# Patient Record
Sex: Female | Born: 1955 | Race: Black or African American | Hispanic: No | Marital: Married | Smoking: Former smoker
Health system: Southern US, Community
[De-identification: ages and names within clinical notes are randomized; demographics above are authoritative.]

## PROBLEM LIST (undated history)

## (undated) DIAGNOSIS — M1990 Unspecified osteoarthritis, unspecified site: Secondary | ICD-10-CM

## (undated) DIAGNOSIS — E7521 Fabry (-Anderson) disease: Secondary | ICD-10-CM

## (undated) DIAGNOSIS — IMO0001 Reserved for inherently not codable concepts without codable children: Secondary | ICD-10-CM

## (undated) DIAGNOSIS — I10 Essential (primary) hypertension: Secondary | ICD-10-CM

## (undated) DIAGNOSIS — E559 Vitamin D deficiency, unspecified: Secondary | ICD-10-CM

## (undated) DIAGNOSIS — K5790 Diverticulosis of intestine, part unspecified, without perforation or abscess without bleeding: Secondary | ICD-10-CM

## (undated) DIAGNOSIS — D649 Anemia, unspecified: Secondary | ICD-10-CM

## (undated) DIAGNOSIS — R053 Chronic cough: Secondary | ICD-10-CM

## (undated) DIAGNOSIS — E119 Type 2 diabetes mellitus without complications: Secondary | ICD-10-CM

## (undated) DIAGNOSIS — K859 Acute pancreatitis without necrosis or infection, unspecified: Secondary | ICD-10-CM

## (undated) DIAGNOSIS — M2550 Pain in unspecified joint: Secondary | ICD-10-CM

## (undated) DIAGNOSIS — J45909 Unspecified asthma, uncomplicated: Secondary | ICD-10-CM

## (undated) DIAGNOSIS — I639 Cerebral infarction, unspecified: Secondary | ICD-10-CM

## (undated) DIAGNOSIS — K219 Gastro-esophageal reflux disease without esophagitis: Secondary | ICD-10-CM

## (undated) DIAGNOSIS — K635 Polyp of colon: Secondary | ICD-10-CM

## (undated) DIAGNOSIS — R05 Cough: Secondary | ICD-10-CM

## (undated) DIAGNOSIS — H409 Unspecified glaucoma: Secondary | ICD-10-CM

## (undated) HISTORY — PX: ABDOMINAL HYSTERECTOMY: SHX81

## (undated) HISTORY — DX: Unspecified osteoarthritis, unspecified site: M19.90

## (undated) HISTORY — DX: Polyp of colon: K63.5

## (undated) HISTORY — DX: Diverticulosis of intestine, part unspecified, without perforation or abscess without bleeding: K57.90

## (undated) HISTORY — DX: Vitamin D deficiency, unspecified: E55.9

## (undated) HISTORY — DX: Acute pancreatitis without necrosis or infection, unspecified: K85.90

## (undated) HISTORY — PX: JOINT REPLACEMENT: SHX530

## (undated) HISTORY — PX: HERNIA REPAIR: SHX51

## (undated) HISTORY — DX: Unspecified asthma, uncomplicated: J45.909

## (undated) HISTORY — DX: Pain in unspecified joint: M25.50

---

## 1988-06-22 HISTORY — PX: BREAST EXCISIONAL BIOPSY: SUR124

## 1997-10-09 ENCOUNTER — Other Ambulatory Visit: Admission: RE | Admit: 1997-10-09 | Discharge: 1997-10-09 | Payer: Self-pay | Admitting: Obstetrics and Gynecology

## 1999-12-03 ENCOUNTER — Other Ambulatory Visit: Admission: RE | Admit: 1999-12-03 | Discharge: 1999-12-03 | Payer: Self-pay | Admitting: Obstetrics and Gynecology

## 2001-05-20 ENCOUNTER — Ambulatory Visit (HOSPITAL_COMMUNITY): Admission: RE | Admit: 2001-05-20 | Discharge: 2001-05-20 | Payer: Self-pay | Admitting: Internal Medicine

## 2001-05-20 ENCOUNTER — Encounter: Payer: Self-pay | Admitting: Internal Medicine

## 2001-05-22 ENCOUNTER — Emergency Department (HOSPITAL_COMMUNITY): Admission: EM | Admit: 2001-05-22 | Discharge: 2001-05-22 | Payer: Self-pay | Admitting: Emergency Medicine

## 2001-05-22 ENCOUNTER — Encounter: Payer: Self-pay | Admitting: Emergency Medicine

## 2001-05-25 ENCOUNTER — Inpatient Hospital Stay (HOSPITAL_COMMUNITY): Admission: AD | Admit: 2001-05-25 | Discharge: 2001-05-25 | Payer: Self-pay | Admitting: Obstetrics and Gynecology

## 2002-01-07 ENCOUNTER — Emergency Department (HOSPITAL_COMMUNITY): Admission: EM | Admit: 2002-01-07 | Discharge: 2002-01-08 | Payer: Self-pay | Admitting: Emergency Medicine

## 2002-06-16 ENCOUNTER — Other Ambulatory Visit: Admission: RE | Admit: 2002-06-16 | Discharge: 2002-06-16 | Payer: Self-pay | Admitting: Obstetrics and Gynecology

## 2002-12-27 ENCOUNTER — Ambulatory Visit (HOSPITAL_COMMUNITY): Admission: RE | Admit: 2002-12-27 | Discharge: 2002-12-27 | Payer: Self-pay | Admitting: Interventional Radiology

## 2003-01-05 ENCOUNTER — Ambulatory Visit (HOSPITAL_COMMUNITY): Admission: RE | Admit: 2003-01-05 | Discharge: 2003-01-05 | Payer: Self-pay | Admitting: Interventional Radiology

## 2003-01-31 ENCOUNTER — Observation Stay (HOSPITAL_COMMUNITY): Admission: RE | Admit: 2003-01-31 | Discharge: 2003-02-01 | Payer: Self-pay | Admitting: Interventional Radiology

## 2003-07-18 ENCOUNTER — Other Ambulatory Visit: Admission: RE | Admit: 2003-07-18 | Discharge: 2003-07-18 | Payer: Self-pay | Admitting: Obstetrics and Gynecology

## 2003-08-24 ENCOUNTER — Encounter: Admission: RE | Admit: 2003-08-24 | Discharge: 2003-11-22 | Payer: Self-pay | Admitting: Obstetrics and Gynecology

## 2003-09-12 ENCOUNTER — Encounter: Admission: RE | Admit: 2003-09-12 | Discharge: 2003-09-12 | Payer: Self-pay | Admitting: Obstetrics and Gynecology

## 2004-06-22 DIAGNOSIS — D649 Anemia, unspecified: Secondary | ICD-10-CM

## 2004-06-22 HISTORY — DX: Anemia, unspecified: D64.9

## 2004-07-02 ENCOUNTER — Other Ambulatory Visit: Admission: RE | Admit: 2004-07-02 | Discharge: 2004-07-02 | Payer: Self-pay | Admitting: Obstetrics and Gynecology

## 2004-07-18 ENCOUNTER — Emergency Department (HOSPITAL_COMMUNITY): Admission: EM | Admit: 2004-07-18 | Discharge: 2004-07-19 | Payer: Self-pay | Admitting: Emergency Medicine

## 2004-08-22 ENCOUNTER — Emergency Department (HOSPITAL_COMMUNITY): Admission: EM | Admit: 2004-08-22 | Discharge: 2004-08-22 | Payer: Self-pay | Admitting: Emergency Medicine

## 2005-01-20 HISTORY — PX: HYSTEROSCOPY WITH D & C: SHX1775

## 2005-01-22 ENCOUNTER — Ambulatory Visit (HOSPITAL_COMMUNITY): Admission: RE | Admit: 2005-01-22 | Discharge: 2005-01-22 | Payer: Self-pay | Admitting: Obstetrics and Gynecology

## 2005-05-22 HISTORY — PX: TOTAL ABDOMINAL HYSTERECTOMY W/ BILATERAL SALPINGOOPHORECTOMY: SHX83

## 2005-06-03 ENCOUNTER — Inpatient Hospital Stay (HOSPITAL_COMMUNITY): Admission: RE | Admit: 2005-06-03 | Discharge: 2005-06-07 | Payer: Self-pay | Admitting: Obstetrics and Gynecology

## 2005-06-03 ENCOUNTER — Encounter (INDEPENDENT_AMBULATORY_CARE_PROVIDER_SITE_OTHER): Payer: Self-pay | Admitting: Specialist

## 2006-01-26 ENCOUNTER — Other Ambulatory Visit: Admission: RE | Admit: 2006-01-26 | Discharge: 2006-01-26 | Payer: Self-pay | Admitting: Obstetrics and Gynecology

## 2007-04-10 ENCOUNTER — Emergency Department (HOSPITAL_COMMUNITY): Admission: EM | Admit: 2007-04-10 | Discharge: 2007-04-10 | Payer: Self-pay | Admitting: *Deleted

## 2007-09-11 ENCOUNTER — Emergency Department (HOSPITAL_COMMUNITY): Admission: EM | Admit: 2007-09-11 | Discharge: 2007-09-11 | Payer: Self-pay | Admitting: Family Medicine

## 2008-06-11 ENCOUNTER — Encounter: Admission: RE | Admit: 2008-06-11 | Discharge: 2008-06-11 | Payer: Self-pay | Admitting: Internal Medicine

## 2009-03-19 ENCOUNTER — Encounter: Admission: RE | Admit: 2009-03-19 | Discharge: 2009-03-26 | Payer: Self-pay | Admitting: Internal Medicine

## 2009-06-13 ENCOUNTER — Encounter: Admission: RE | Admit: 2009-06-13 | Discharge: 2009-06-13 | Payer: Self-pay | Admitting: Internal Medicine

## 2009-11-23 ENCOUNTER — Emergency Department (HOSPITAL_COMMUNITY): Admission: EM | Admit: 2009-11-23 | Discharge: 2009-11-23 | Payer: Self-pay | Admitting: Emergency Medicine

## 2010-06-18 ENCOUNTER — Encounter
Admission: RE | Admit: 2010-06-18 | Discharge: 2010-06-18 | Payer: Self-pay | Source: Home / Self Care | Attending: Internal Medicine | Admitting: Internal Medicine

## 2010-09-28 ENCOUNTER — Emergency Department (HOSPITAL_COMMUNITY)
Admission: EM | Admit: 2010-09-28 | Discharge: 2010-09-28 | Disposition: A | Discharge status: Home / Self Care | Payer: BC Managed Care – PPO | Attending: Emergency Medicine | Admitting: Emergency Medicine

## 2010-09-28 DIAGNOSIS — L2989 Other pruritus: Secondary | ICD-10-CM | POA: Insufficient documentation

## 2010-09-28 DIAGNOSIS — T394X5A Adverse effect of antirheumatics, not elsewhere classified, initial encounter: Secondary | ICD-10-CM | POA: Insufficient documentation

## 2010-09-28 DIAGNOSIS — L270 Generalized skin eruption due to drugs and medicaments taken internally: Secondary | ICD-10-CM | POA: Insufficient documentation

## 2010-09-28 DIAGNOSIS — E119 Type 2 diabetes mellitus without complications: Secondary | ICD-10-CM | POA: Insufficient documentation

## 2010-09-28 DIAGNOSIS — R6889 Other general symptoms and signs: Secondary | ICD-10-CM | POA: Insufficient documentation

## 2010-09-28 DIAGNOSIS — H409 Unspecified glaucoma: Secondary | ICD-10-CM | POA: Insufficient documentation

## 2010-09-28 DIAGNOSIS — R109 Unspecified abdominal pain: Secondary | ICD-10-CM | POA: Insufficient documentation

## 2010-09-28 DIAGNOSIS — L298 Other pruritus: Secondary | ICD-10-CM | POA: Insufficient documentation

## 2010-11-07 NOTE — H&P (Signed)
NAMESEQUOIA, [name]               ACCOUNT NO.:  [id number]   MEDICAL RECORD NO.:  [id number]          PATIENT TYPE:  INP   LOCATION:  NA                            FACILITY:  [facility]   PHYSICIAN:  Janine Limbo, M.D.DATE OF BIRTH:  13-Mar-1956   DATE OF ADMISSION:  06/03/2005  DATE OF DISCHARGE:                                HISTORY & PHYSICAL   HISTORY OF PRESENT ILLNESS:  [name] is a 55 year old female, para 5-0-2-  5, who presents for an abdominal hysterectomy.  She has been followed at the  [facility].  The patient has a known history of very large fibroids.  She has  pelvic pressure symptoms but otherwise is doing reasonably well.  She has  had some irregular bleeding and we attempted to do an endometrial biopsy.  This was not successful.  We also attempted to do a hysteroscopy with  dilatation and curettage.  We were unable to cannulate the uterus because of  the large fibroids.  The patient's most recent Pap smear was within normal  limits.  She has a past history of anemia.  She now wishes to proceed with  definitive therapy.   OBSTETRICAL HISTORY:  The patient has had:  1.  Five term vaginal deliveries.  2.  One elective pregnancy termination.  3.  One miscarriage.   DRUG ALLERGIES:  PENICILLIN causes hives.   PAST MEDICAL HISTORY:  1.  Claustrophobia.  2.  Uterine artery embolization in 2005.  3.  The patient did have dilatation and curettage in 1993.   SOCIAL HISTORY:  The patient denies cigarette use, alcohol use, and  recreational drug use.   REVIEW OF SYSTEMS:  Please see history of present illness.   FAMILY HISTORY:  The patient's father has hypertension.  The patient's  mother has diabetes.   PHYSICAL EXAMINATION:  VITAL SIGNS:  Weight is 233 pounds, height is 5 feet  3 inches.  HEENT:  Within normal limits.  CHEST:  Clear.  HEART:  Regular rate and rhythm.  BREASTS:   Without masses.  ABDOMEN:  Nontender. Large fibroids are noted 3-to-4-cm above the umbilicus.  EXTREMITIES:  Grossly normal.  NEUROLOGIC:  Grossly normal.  PELVIC:  External genitalia is normal.  The vagina is normal except for a  moderate cystocele and rectocele.  The cervix is nontender.  The uterus is  24 weeks size and irregular.  The uterus is nontender.  Adnexa:  No masses  can be appreciated.  Rectovaginal exam confirms.   ASSESSMENT:  1.  Large fibroid uterus.  2.  Pelvic pressure symptoms.  3.  Unable to perform an endometrial sample.   PLAN:  The patient will undergo a total abdominal hysterectomy.  She  understands the indications for the procedure, and she accepts the risk of,  but not limited to, anesthetic complications, bleeding, infection, and  possible damage to the surrounding organs.      Janine Limbo, M.D.  Electronically Signed     AVS/MEDQ  D:  05/29/2005  T:  05/29/2005  Job:  [id number]

## 2010-11-07 NOTE — Op Note (Signed)
[name], [name]               ACCOUNT NO.:  [id number]   MEDICAL RECORD NO.:  [id number]          PATIENT TYPE:  INP   LOCATION:  9311                          [facility]ILITY[facility]  [facility]   PHYSICIAN:  Janine Limbo, M.D.DATE OF BIRTH:  1955-08-27   DATE OF PROCEDURE:  06/03/2005  DATE OF DISCHARGE:                                 OPERATIVE REPORT   PREOPERATIVE DIAGNOSIS:  1.  24-week size fibroid uterus.  2.  Pelvic pressure symptoms.  3.  Anemia (hemoglobin 10.0).  4.  Obesity (weight 233 pounds, height 5 feet 3 inches).   POSTOPERATIVE DIAGNOSIS:  1.  24-week size fibroid uterus.  2.  Pelvic pressure symptoms.  3.  Anemia (hemoglobin 10.0).  4.  Obesity (weight 233 pounds, height 5 feet 3 inches).  5.  Pelvic adhesions.   PROCEDURE:  1.  Total abdominal hysterectomy.  2.  Lysis of adhesions.  3.  Left salpingectomy.  4.  Placement of a Jackson-Pratt drain.  5.  Cystoscopy.   SURGEON:  Dr. Leonard Schwartz   FIRST ASSISTANT:  Henreitta Leber, PA-C.   ANESTHETIC:  Is general.   DISPOSITION:  [name] is a 56 year old female with known large fibroids  and the above-mentioned diagnoses. She has had a prior uterine artery  embolization that did not relieve her discomfort. She understands the  indications for her surgical procedure and she accepts the risks of, but not  limited to, anesthetic complications, bleeding, infections, and possible  damage to surrounding organs. The patient carefully considered the risks and  benefits associated with bilateral oophorectomy and she elected to keep her  ovaries.   FINDINGS:  The patient had a 24-week size multi fibroid uterus. Fibroids  were noted to be subserosal, intramural, and submucosal. The patient had a  cystic appearing mass on the anterior uterus that measured approximately 12  x 12 cm. This was consistent with a fibroid but did not have a typical  apparent. Frozen section analysis showed that there were no  excessive  mitotic features and there were no atypical features to the fibroids. The  patient had a large fibroid present at the left uterine artery as well as a  large fibroid present at the right uterine artery. There was also a large  fibroid present in the left retroperitoneal space at the level of the  infundibulopelvic ligament. The bowel was adhered to the posterior surface  of the uterus. The fallopian tubes were adhered to the left lateral portion  of the uterus and to the right lateral portion of the uterus. The bowel and  the appendix appeared normal. Cystoscopy was performed at the end of our  procedure and there was no evidence of damage to the bladder mucosa. Blue  dye was noted to easily passed through both ureteral orifices.  The total  weight of the uterus was 4626 grams.   PROCEDURE:  The patient was taken to the operating room where general  anesthetic was given. The patient's abdomen, perineum, and vagina were  prepped with multiple layers of Betadine. A Foley catheter was placed in the  bladder. The the patient was sterilely draped after examination under  anesthesia. The midline of the abdomen was injected with half percent  Marcaine with epinephrine. An incision was made in the midline and carried  sharply through the subcutaneous tissue, fascia, and the anterior  peritoneum. Care was taken as we entered the abdomen so that we did not  damage any of the vital underlying structures. We quickly realized that the  uterus was even larger than we anticipated. We had to extend the incision  around the umbilicus and up the midline. Again care was taken. An abdominal  wall retractor was placed. The extremely large uterus was elevated into the  operative field. It was difficult to assess normal anatomy because of the  multiple fibroids, the adhesions, and the way the tissues had been  stretched. We were able to isolate the round ligaments bilaterally. These  were clamped and  cut. We then performed a lysis of adhesions by retracting  the bowel laterally and being very careful to operate on the uterine  surface. There was no evidence of damage to the bowel. The utero-ovarian  ligaments and then the fallopian tubes were clamped, cut, sutured, and tied  securely. We carefully then clamped the peritoneum along the edges of the  fibroids. Bleeding was encountered and hemostasis was achieved using figure-  of-eight sutures. Care was taken not to damage the infundibulopelvic  ligaments and not to damage any of the vital structures. We were able to  slowly clamp down the fundus of the uterus that measured at least 20 cm in  width. We then isolated the uterine arteries bilaterally. The bladder flap  was developed anteriorly. This part of the procedure was compromised by the  fact that there were large fibroids present in the left retroperitoneal  space but also a large fibroid present at the lower uterine segment at the  level of the bladder and it was difficult to the operate around these  fibroids. Once we isolated the uterine arteries bilaterally. We then  transected the fundus of the uterus from the lower segment. Bleeding was  brisk and bleeding was controlled using Kocher clamps. We then essentially  performed a myomectomy such that we carefully removed the fibroid from the  left retroperitoneal space. Care was taken not to damage the ureters or the  vital underlying structures. We then performed a myomectomy such that we  removed the fibroid from the lower uterine segment pushing the bladder  caudad as we performed our surgery. At this point we were able then to clamp  the paracervical tissues, and the uterosacral ligaments bilaterally. The  cervix was transected from the apex of the vagina. The vaginal cuff was  closed using figure-of-eight sutures. The patient was noted to drain clear yellow urine. The patient received clindamycin preoperatively because she   reports being allergic to penicillin. Because of the blood loss was known to  be excessive we then ordered a second dose of antibiotics. It was discovered  by the pharmacy department that in 2004 the patient had actually reported to  the pharmacy team that she was allergic not only to penicillin but also to  erythromycin, clindamycin, and hydrocodone. This allergy was not reported to  me, the preoperative team, or the anesthesia team. However, because the  patient was not able to help clarify the issue I elected not to give her a  follow-up dose of clindamycin. I did give the patient vancomycin in the  recovery room. We  then achieved hemostasis using free ties of 0 Vicryl and 3-  0 Vicryl. The pelvis was vigorously irrigated. Hemostasis was noted to be  adequate. All packs were then removed from the patient's abdomen. The  fascia, abdominal musculature, and the anterior peritoneum were closed using  a Smead-Jones suture of 0 PDS. The subcutaneous layer was irrigated. A  Jackson-Pratt drain was placed in the subcutaneous layer and brought out  through the right lower quadrant. The Jackson-Pratt drain was sutured into  place using interrupted suture of 3-0 silk. The subcutaneous layer was  closed using 0 Vicryl. The skin was reapproximated using skin staples. A  pressure dressing was placed on the abdomen. Sponge, needle, instrument  counts were correct. 0 Vicryl suture material used except where otherwise  mentioned. The patient tolerated her procedure well. The estimated blood  loss was 1800 mL. The patient received 5200 mL of IV fluids. Her urine  output was 130 mL. Initially the patient had very little urine output but  did respond to a fluid bolus. The patient was then given an ampule of indigo  carmine. The patient was placed in a lithotomy position. The Foley catheter  was removed. The diagnostic cystoscope was then placed in the bladder. The  vaginal mucosa was carefully inspected and  there was no evidence of damage  to the bladder. Blue dye was noted to pass easily through  both ureteral orifices. The cystoscope was removed. A new Foley catheter was  placed. The patient was returned to the supine position. She was awakened  from her anesthetic. She was taken to the recovery room in stable condition.  She tolerated her procedure well.      Janine Limbo, M.D.  Electronically Signed     AVS/MEDQ  D:  06/03/2005  T:  06/04/2005  Job:  [id number]

## 2010-11-07 NOTE — H&P (Signed)
NAMEARALYN, [name]               ACCOUNT NO.:  [id number]   MEDICAL RECORD NO.:  [id number]          PATIENT TYPE:  INP   LOCATION:  NA                            [facility]   PHYSICIAN:  Janine Limbo, M.D.DATE OF BIRTH:  20-Feb-1956   DATE OF ADMISSION:  DATE OF DISCHARGE:                                HISTORY & PHYSICAL   HISTORY OF PRESENT ILLNESS:  [name] is a 55 year old female, para 5-0-2-  5, who presents for hysteroscopy with dilatation and curettage.  The patient  has been followed at the [facility]  [facility].  The patient has a known history of large  fibroids.  She complains of pelvic pressure symptoms, but otherwise is doing  reasonably well.  She is status post uterine artery embolization.  We have  attempted an endometrial biopsy in the past, but were unable to sound the  uterus because of the large fibroids.  She has a past history of anemia.   OBSTETRICAL HISTORY:  The patient has had 5 term vaginal deliveries, 1  elective pregnancy termination, and 1 miscarriage.   DRUG ALLERGIES:  PENICILLIN causes hives.   PAST MEDICAL HISTORY:  1.  The patient has a history of claustrophobia.  2.  She had uterine artery embolization in 2005.  3.  She had a dilatation and curettage in 1993.   SOCIAL HISTORY:  The patient denies cigarette use, alcohol use, and  recreational drug use.   REVIEW OF SYSTEMS:  Noncontributory.   FAMILY HISTORY:  The patient's uncle had lung cancer.  Her maternal  grandfather had stomach cancer.  Her father has hypertension.  Her mother is  a noninsulin-dependent diabetic.   PHYSICAL EXAMINATION:  VITAL SIGNS:  Weight is 233 pounds.  Height is 5  feet, 3 inches.  HEENT:  Within normal limits.  CHEST:  Clear.  HEART:  Regular rate and rhythm.  BREASTS:  Without masses.  ABDOMEN:  Nontender.  Enlarged fibroids are noted 3-4 cm above the  umbilicus.  EXTREMITIES:   Grossly normal.  NEUROLOGIC:  Grossly normal.  PELVIC:  External genitalia is normal.  The vagina is normal, except for a  cystocele and a rectocele.  The cervix is nontender, but it is difficult to  bring the cervix in the midline because of large fibroids.  The uterus is 24  weeks size and irregular.  Nontender.  Adnexa - no masses could be  appreciated.  RECTOVAGINAL:  Confirms.   ASSESSMENT:  1.  Large fibroid uterus.  2.  Pelvic pressure symptoms.  3.  Unable to perform an endometrial sample.   PLAN:  The patient will undergo a hysteroscopy with dilatation and  curettage.  She understands the indications for her surgical procedure, and  she accepts the risk of, but not limited to, anesthetic complications,  bleeding, infections, and possible damage to the surrounding organs.  We  discussed the fact that her large fibroids make it very difficult to  evaluate her adnexa, but she would nevertheless like to avoid a hysterectomy  and  a myomectomy if at all possible.       AVS/MEDQ  D:  01/21/2005  T:  01/21/2005  Job:  [id number]

## 2010-11-07 NOTE — Op Note (Signed)
[name], [name]               ACCOUNT NO.:  [id number]   MEDICAL RECORD NO.:  [id number]          PATIENT TYPE:  AMB   LOCATION:  SDC                           [facility]   PHYSICIAN:  Janine Limbo, M.D.DATE OF BIRTH:  02-Jul-1955   DATE OF PROCEDURE:  01/22/2005  DATE OF DISCHARGE:                                 OPERATIVE REPORT   PREOPERATIVE DIAGNOSES:  1.  A 24-week size fibroid.  2.  Anemia (hemoglobin is 10.1).  3.  Pelvic pressure symptoms.   POSTOPERATIVE DIAGNOSES:  1.  A 24-week size fibroid.  2.  Anemia (hemoglobin is 10.1).  3.  Pelvic pressure symptoms.   PROCEDURE:  1.  Attempted hysteroscopy with dilatation and curettage.  2.  Examination under anesthesia.   SURGEON:  Dr. Leonard Schwartz.   ANESTHETIC:  Monitored anesthetic control.   DISPOSITION:  [name] is a 55 year old female, para 5-0-2-5, who  presents with 24-week size fibroids, anemia, and pelvic pressure symptoms.  She does not want a hysterectomy.  We tried to perform an endometrial  sampling in the office, but we were unable to do so because of the patient's  large uterus and because the cervix was difficult to bring into the midline  because of the distorted uterus.  The patient understands the indications  for the proposed surgical procedure, and she accepts the risk of, but not  limited to, anesthetic complications, bleeding, infections, and possible  damage to the surrounding organs.   FINDINGS:  The patient has a 24-week size multifibroid uterus.  The cervix  was difficult to bring into the midline because of the large uterus and the  multiple fibroids.  The patient has a marked cystocele and rectocele.   PROCEDURE:  The patient was taken to the operating room where she was given  medication through her IV line.  The lower abdomen, perineum, and vagina  were prepped with multiple layers of Betadine.  The bladder was drained of  urine.  The patient was sterilely  draped.  Examination under anesthesia was  performed.  We tried multiple different speculums including weighted  speculum and Deaver retractors to try to adequately visualize the cervix and  to bring the cervix into the midline at the top of a patulous vagina so that  we could safely perform an endocervical curettage and the proposed  hysteroscopy with dilatation and curettage.  We were unable, however, to  bring the cervix into the midline after multiple attempts.  The decision was  made to discontinue attempts at the procedure.  All instruments were  removed.  Examination under anesthesia was again performed, and the uterus  was noted to be as it was preoperatively.  The patient was awakened from her  anesthetic and returned to recovery room in stable condition.  All counts  were correct.   FOLLOW-UP INSTRUCTIONS:  The patient will use Tylenol or Advil as needed for  discomfort.  She will return to see Dr. Stefano Gaul in 2-3 weeks for follow-up  examination.  She will call for questions or concerns.  The patient will  need to decide whether or not she wants to proceed with hysterectomy or  whether or not she wants to simply not have a sampling of the endometrial  contents.  She may also decide to seek a second opinion from another  physician.       AVS/MEDQ  D:  01/22/2005  T:  01/22/2005  Job:  [id number]

## 2010-11-07 NOTE — Discharge Summary (Signed)
NAMENOELIE, RENFROW               ACCOUNT NO.:  [id number]   MEDICAL RECORD NO.:  [id number]          PATIENT TYPE:  INP   LOCATION:  9311                          [facility]   PHYSICIAN:  Janine Limbo, M.D.DATE OF BIRTH:  11-Apr-1956   DATE OF ADMISSION:  06/03/2005  DATE OF DISCHARGE:  06/07/2005                                 DISCHARGE SUMMARY   DISCHARGE DIAGNOSIS:  Large fibroid uterus, pelvic pressure, pelvic adhesion  and severe anemia.   OPERATION:  On the date of admission the patient underwent a total abdominal  hysterectomy with lysis of adhesions, left salpingectomy and cystoscopy,  tolerating all procedures well. The patient had a 24-week-size multifibroid  uterus weighing 4626 g. The fibroids were noted to be subserosal, intramural  and submucosal, with a cystic-appearing mass on the anterior uterus that  measured approximately 12 x 12 cm. Though this was consistent with a fibroid  it did have an atypical appearance and was therefore sent for frozen  section; however, results did not reveal any excessive mitotic features nor  were there any atypical features to the fibroids. The patient had a large  fibroid present at the left uterine artery as well as a large fibroid  present at the right uterine artery. There were also large fibroids in the  left retroperitoneal space at the level of the infundibulopelvic ligament.  The patient's bowel was adhered to the posterior service of the uterus. The  fallopian tubes were also adhered to the left lateral portion of the uterus  and the right lateral portion of the uterus. The bowel and appendix appeared  normal and cystoscopy was formed at the end of the procedure revealing no  evidence of damage to the bladder mucosa. Additionally, blue dye was noted  to pass easily through both ureteral orifices.   HISTORY OF PRESENT ILLNESS:  Ms. Obi is a 55 year old female para 5-0-2-  5 who presents for an abdominal  hysterectomy because of pressure and  bleeding symptoms. Please see the patient's dictated history and physical  examination for details.   PREOPERATIVE PHYSICAL EXAMINATION:  Weight is 233 pounds, height is 5 feet 3  inches tall. General exam is within normal limits. However, do note that her  abdominal exam reveals a nontender abdomen with large fibroids noted to be 3-  4 cm above the umbilicus. Pelvic exam:  External genitalia is normal. The  vagina is normal except for a moderate cystocele and rectocele. The cervix  is nontender. Uterus is 24-week size and irregular. Uterus is nontender.  Adnexa:  No masses can be appreciated. Rectovaginal exam confirms.   HOSPITAL COURSE:  On the date of admission and the patient underwent  aforementioned procedures, tolerating them all well. The patient's initial  postoperative hemoglobin was 5.4 (preoperative hemoglobin 10.0). By  postoperative day #1 the patient's hemoglobin had stabilized to 6.6 and  initially was asymptomatic and therefore the patient declined transfusion.  By postoperative day #3, however, the patient did decide that she did want  to proceed with transfusion of 2 units of packed red blood cells.  Posttransfusion  hemoglobin was 7.4. The patient developed a posttransfusion  temperature of 100.8 degrees Fahrenheit orally. However, by postoperative  day #4 this had resolved. Additionally, the patient had resumed bowel and  bladder function and therefore was deemed ready to discharge home.   DISCHARGE MEDICATIONS:  1.  Iron twice daily.  2.  Vicodin one to two tablets every 4 hours as needed for pain.  3.  Phenergan 25 mg every 6 hours for nausea.  4.  Ibuprofen 600 mg every 6 hours with food for 3 days, then as needed for      pain.  5.  Colace 100 mg twice daily until bowel movements are regular.  6.  Replens was advised to be used as needed.   FOLLOW UP:  The patient is scheduled for a staple removal at [facility] on June 11, 2005, at 11:00 a.m. She is also scheduled  for a 6-week follow-up with Dr. Stefano Gaul on July 15, 2005 at 10:30 a.m..   DISCHARGE INSTRUCTIONS:  The patient was given a copy of [facility] postoperative instruction sheet. She was further advised to avoid  driving for 2 weeks, heavy lifting for 4 weeks, intercourse for 6 weeks;  that she may shower, walk up steps; and increase her diet slowly. The  patient's diet was without restriction.   FINAL PATHOLOGY:  Uterus and cyst:  Proliferative endometrium. No evidence  of hyperplasia or malignancy. Leiomyomata with degenerative changes. Fibroid  and uterus:  Leiomyoma with degenerative changes, unremarkable cervix.      [place] J. Adline Peals.      Janine Limbo, M.D.  Electronically Signed    EJP/MEDQ  D:  06/19/2005  T:  06/19/2005  Job:  [id number]

## 2011-03-16 LAB — CULTURE, ROUTINE-ABSCESS
Culture: NO GROWTH
Gram Stain: NONE SEEN

## 2011-07-09 ENCOUNTER — Other Ambulatory Visit: Payer: Self-pay | Admitting: Internal Medicine

## 2011-07-09 DIAGNOSIS — Z1231 Encounter for screening mammogram for malignant neoplasm of breast: Secondary | ICD-10-CM

## 2011-07-20 ENCOUNTER — Ambulatory Visit: Payer: BC Managed Care – PPO

## 2011-07-27 ENCOUNTER — Ambulatory Visit: Payer: BC Managed Care – PPO

## 2011-09-30 ENCOUNTER — Ambulatory Visit: Payer: BC Managed Care – PPO

## 2011-10-01 ENCOUNTER — Ambulatory Visit
Admission: RE | Admit: 2011-10-01 | Discharge: 2011-10-01 | Disposition: A | Discharge status: Home / Self Care | Payer: BC Managed Care – PPO | Source: Ambulatory Visit | Attending: Internal Medicine | Admitting: Internal Medicine

## 2011-10-01 DIAGNOSIS — Z1231 Encounter for screening mammogram for malignant neoplasm of breast: Secondary | ICD-10-CM

## 2012-03-29 ENCOUNTER — Other Ambulatory Visit: Payer: Self-pay | Admitting: Orthopaedic Surgery

## 2012-03-29 DIAGNOSIS — M25559 Pain in unspecified hip: Secondary | ICD-10-CM

## 2012-04-27 ENCOUNTER — Other Ambulatory Visit: Payer: BC Managed Care – PPO

## 2012-04-27 ENCOUNTER — Ambulatory Visit
Admission: RE | Admit: 2012-04-27 | Discharge: 2012-04-27 | Disposition: A | Discharge status: Home / Self Care | Payer: BC Managed Care – PPO | Source: Ambulatory Visit | Attending: Orthopaedic Surgery | Admitting: Orthopaedic Surgery

## 2012-04-27 DIAGNOSIS — M25559 Pain in unspecified hip: Secondary | ICD-10-CM

## 2012-11-11 ENCOUNTER — Other Ambulatory Visit: Payer: Self-pay

## 2012-11-11 DIAGNOSIS — Z1231 Encounter for screening mammogram for malignant neoplasm of breast: Secondary | ICD-10-CM

## 2012-11-18 ENCOUNTER — Ambulatory Visit
Admission: RE | Admit: 2012-11-18 | Discharge: 2012-11-18 | Disposition: A | Discharge status: Home / Self Care | Payer: BC Managed Care – PPO | Source: Ambulatory Visit

## 2012-11-18 DIAGNOSIS — Z1231 Encounter for screening mammogram for malignant neoplasm of breast: Secondary | ICD-10-CM

## 2013-08-19 ENCOUNTER — Inpatient Hospital Stay (HOSPITAL_COMMUNITY): Payer: BC Managed Care – PPO

## 2013-08-19 ENCOUNTER — Emergency Department (HOSPITAL_COMMUNITY): Payer: BC Managed Care – PPO

## 2013-08-19 ENCOUNTER — Encounter (HOSPITAL_COMMUNITY): Payer: Self-pay | Admitting: Emergency Medicine

## 2013-08-19 ENCOUNTER — Inpatient Hospital Stay (HOSPITAL_COMMUNITY)
Admission: EM | Admit: 2013-08-19 | Discharge: 2013-08-29 | DRG: 354 | Disposition: A | Discharge status: Home / Self Care | Payer: BC Managed Care – PPO | Attending: Internal Medicine | Admitting: Internal Medicine

## 2013-08-19 DIAGNOSIS — I10 Essential (primary) hypertension: Secondary | ICD-10-CM | POA: Diagnosis present

## 2013-08-19 DIAGNOSIS — K859 Acute pancreatitis without necrosis or infection, unspecified: Secondary | ICD-10-CM

## 2013-08-19 DIAGNOSIS — J069 Acute upper respiratory infection, unspecified: Secondary | ICD-10-CM | POA: Diagnosis present

## 2013-08-19 DIAGNOSIS — K439 Ventral hernia without obstruction or gangrene: Secondary | ICD-10-CM | POA: Diagnosis present

## 2013-08-19 DIAGNOSIS — Z6839 Body mass index (BMI) 39.0-39.9, adult: Secondary | ICD-10-CM

## 2013-08-19 DIAGNOSIS — K469 Unspecified abdominal hernia without obstruction or gangrene: Secondary | ICD-10-CM

## 2013-08-19 DIAGNOSIS — E119 Type 2 diabetes mellitus without complications: Secondary | ICD-10-CM | POA: Diagnosis present

## 2013-08-19 DIAGNOSIS — R109 Unspecified abdominal pain: Secondary | ICD-10-CM | POA: Diagnosis present

## 2013-08-19 DIAGNOSIS — R933 Abnormal findings on diagnostic imaging of other parts of digestive tract: Secondary | ICD-10-CM

## 2013-08-19 DIAGNOSIS — Z1211 Encounter for screening for malignant neoplasm of colon: Secondary | ICD-10-CM

## 2013-08-19 DIAGNOSIS — K432 Incisional hernia without obstruction or gangrene: Principal | ICD-10-CM | POA: Diagnosis present

## 2013-08-19 DIAGNOSIS — Z880 Allergy status to penicillin: Secondary | ICD-10-CM

## 2013-08-19 DIAGNOSIS — K5730 Diverticulosis of large intestine without perforation or abscess without bleeding: Secondary | ICD-10-CM | POA: Diagnosis present

## 2013-08-19 DIAGNOSIS — D126 Benign neoplasm of colon, unspecified: Secondary | ICD-10-CM | POA: Diagnosis present

## 2013-08-19 DIAGNOSIS — K560 Paralytic ileus: Secondary | ICD-10-CM | POA: Diagnosis present

## 2013-08-19 HISTORY — DX: Essential (primary) hypertension: I10

## 2013-08-19 HISTORY — DX: Fabry (-anderson) disease: E75.21

## 2013-08-19 HISTORY — DX: Unspecified glaucoma: H40.9

## 2013-08-19 HISTORY — DX: Anemia, unspecified: D64.9

## 2013-08-19 HISTORY — DX: Type 2 diabetes mellitus without complications: E11.9

## 2013-08-19 LAB — COMPREHENSIVE METABOLIC PANEL
ALT: 13 U/L (ref 0–35)
AST: 11 U/L (ref 0–37)
Albumin: 3.7 g/dL (ref 3.5–5.2)
Alkaline Phosphatase: 97 U/L (ref 39–117)
BUN: 12 mg/dL (ref 6–23)
CO2: 27 mEq/L (ref 19–32)
Calcium: 9.4 mg/dL (ref 8.4–10.5)
Chloride: 100 mEq/L (ref 96–112)
Creatinine, Ser: 0.83 mg/dL (ref 0.50–1.10)
GFR calc Af Amer: 90 mL/min — ABNORMAL LOW (ref >90)
GFR calc non Af Amer: 77 mL/min — ABNORMAL LOW (ref >90)
Glucose, Bld: 138 mg/dL — ABNORMAL HIGH (ref 70–99)
Potassium: 4.1 mEq/L (ref 3.7–5.3)
Sodium: 140 mEq/L (ref 137–147)
Total Bilirubin: 0.3 mg/dL (ref 0.3–1.2)
Total Protein: 7.8 g/dL (ref 6.0–8.3)

## 2013-08-19 LAB — URINALYSIS, ROUTINE W REFLEX MICROSCOPIC
Bilirubin Urine: NEGATIVE
Glucose, UA: NEGATIVE mg/dL
Hgb urine dipstick: NEGATIVE
Ketones, ur: NEGATIVE mg/dL
Leukocytes, UA: NEGATIVE
Nitrite: NEGATIVE
Protein, ur: NEGATIVE mg/dL
Specific Gravity, Urine: 1.02 (ref 1.005–1.030)
Urobilinogen, UA: 1 mg/dL (ref 0.0–1.0)
pH: 5.5 (ref 5.0–8.0)

## 2013-08-19 LAB — LIPASE, BLOOD: Lipase: 67 U/L — ABNORMAL HIGH (ref 11–59)

## 2013-08-19 LAB — CBC WITH DIFFERENTIAL/PLATELET
Basophils Absolute: 0 10*3/uL (ref 0.0–0.1)
Basophils Relative: 0 % (ref 0–1)
Eosinophils Absolute: 0.3 10*3/uL (ref 0.0–0.7)
Eosinophils Relative: 3 % (ref 0–5)
HCT: 40.9 % (ref 36.0–46.0)
Hemoglobin: 14.3 g/dL (ref 12.0–15.0)
Lymphocytes Relative: 34 % (ref 12–46)
Lymphs Abs: 2.9 10*3/uL (ref 0.7–4.0)
MCH: 29.7 pg (ref 26.0–34.0)
MCHC: 35 g/dL (ref 30.0–36.0)
MCV: 85 fL (ref 78.0–100.0)
Monocytes Absolute: 0.6 10*3/uL (ref 0.1–1.0)
Monocytes Relative: 8 % (ref 3–12)
Neutro Abs: 4.6 10*3/uL (ref 1.7–7.7)
Neutrophils Relative %: 55 % (ref 43–77)
Platelets: 341 10*3/uL (ref 150–400)
RBC: 4.81 MIL/uL (ref 3.87–5.11)
RDW: 14 % (ref 11.5–15.5)
WBC: 8.4 10*3/uL (ref 4.0–10.5)

## 2013-08-19 MED ORDER — IOHEXOL 300 MG/ML  SOLN
25.0000 mL | INTRAMUSCULAR | Status: AC | PRN
  Administered 2013-08-19 (×2): 25 mL via ORAL

## 2013-08-19 MED ORDER — IOHEXOL 300 MG/ML  SOLN
100.0000 mL | Freq: Once | INTRAMUSCULAR | Status: AC | PRN
  Administered 2013-08-19: 100 mL via INTRAVENOUS

## 2013-08-19 MED ORDER — FENTANYL CITRATE 0.05 MG/ML IJ SOLN
100.0000 ug | Freq: Once | INTRAMUSCULAR | Status: AC
  Administered 2013-08-19: 100 ug via INTRAVENOUS
  Filled 2013-08-19: qty 2

## 2013-08-19 MED ORDER — PSEUDOEPHEDRINE HCL 60 MG PO TABS
60.0000 mg | ORAL_TABLET | Freq: Two times a day (BID) | ORAL | Status: DC | PRN
  Filled 2013-08-19: qty 1

## 2013-08-19 MED ORDER — ONDANSETRON HCL 4 MG/2ML IJ SOLN: 4.0000 mg | Freq: Three times a day (TID) | INTRAMUSCULAR | Status: DC | PRN

## 2013-08-19 MED ORDER — GUAIFENESIN ER 600 MG PO TB12
600.0000 mg | ORAL_TABLET | Freq: Two times a day (BID) | ORAL | Status: DC | PRN
  Filled 2013-08-19: qty 1

## 2013-08-19 MED ORDER — ONDANSETRON HCL 4 MG/2ML IJ SOLN
4.0000 mg | Freq: Once | INTRAMUSCULAR | Status: AC
  Administered 2013-08-19: 4 mg via INTRAVENOUS
  Filled 2013-08-19: qty 2

## 2013-08-19 MED ORDER — ONDANSETRON HCL 4 MG/2ML IJ SOLN
4.0000 mg | Freq: Three times a day (TID) | INTRAMUSCULAR | Status: DC | PRN
  Administered 2013-08-20 (×2): 4 mg via INTRAVENOUS
  Filled 2013-08-19 (×2): qty 2

## 2013-08-19 MED ORDER — PSEUDOEPHEDRINE-GUAIFENESIN ER 60-600 MG PO TB12: 1.0000 | ORAL_TABLET | Freq: Two times a day (BID) | ORAL | Status: DC | PRN

## 2013-08-19 MED ORDER — HYDROMORPHONE HCL PF 1 MG/ML IJ SOLN
1.0000 mg | INTRAMUSCULAR | Status: DC | PRN
  Administered 2013-08-19 – 2013-08-20 (×2): 1 mg via INTRAVENOUS
  Filled 2013-08-19 (×2): qty 1

## 2013-08-19 MED ORDER — SODIUM CHLORIDE 0.9 % IV SOLN: INTRAVENOUS | Status: DC

## 2013-08-19 MED ORDER — HYDROMORPHONE HCL PF 1 MG/ML IJ SOLN
1.0000 mg | INTRAMUSCULAR | Status: DC | PRN
  Administered 2013-08-19: 1 mg via INTRAVENOUS
  Filled 2013-08-19: qty 1

## 2013-08-19 MED ORDER — LATANOPROST 0.005 % OP SOLN
1.0000 [drp] | Freq: Every day | OPHTHALMIC | Status: DC
  Administered 2013-08-19 – 2013-08-28 (×10): 1 [drp] via OPHTHALMIC
  Filled 2013-08-19: qty 2.5

## 2013-08-19 MED ORDER — ENOXAPARIN SODIUM 40 MG/0.4ML ~~LOC~~ SOLN
40.0000 mg | SUBCUTANEOUS | Status: DC
  Administered 2013-08-19 – 2013-08-23 (×5): 40 mg via SUBCUTANEOUS
  Filled 2013-08-19 (×6): qty 0.4

## 2013-08-19 MED ORDER — INSULIN ASPART 100 UNIT/ML ~~LOC~~ SOLN
0.0000 [IU] | SUBCUTANEOUS | Status: DC
  Administered 2013-08-20: 3 [IU] via SUBCUTANEOUS
  Administered 2013-08-20 – 2013-08-21 (×4): 1 [IU] via SUBCUTANEOUS
  Administered 2013-08-21 (×2): 2 [IU] via SUBCUTANEOUS
  Administered 2013-08-21 – 2013-08-22 (×3): 1 [IU] via SUBCUTANEOUS
  Administered 2013-08-22: 2 [IU] via SUBCUTANEOUS
  Administered 2013-08-22 – 2013-08-23 (×2): 1 [IU] via SUBCUTANEOUS
  Administered 2013-08-23 (×2): 2 [IU] via SUBCUTANEOUS
  Administered 2013-08-23 – 2013-08-26 (×6): 1 [IU] via SUBCUTANEOUS
  Administered 2013-08-27: 2 [IU] via SUBCUTANEOUS
  Administered 2013-08-27: 1 [IU] via SUBCUTANEOUS
  Administered 2013-08-27: 2 [IU] via SUBCUTANEOUS
  Administered 2013-08-27 (×3): 1 [IU] via SUBCUTANEOUS
  Administered 2013-08-28: 2 [IU] via SUBCUTANEOUS
  Administered 2013-08-28: 3 [IU] via SUBCUTANEOUS
  Administered 2013-08-28: 1 [IU] via SUBCUTANEOUS
  Administered 2013-08-28: 2 [IU] via SUBCUTANEOUS
  Administered 2013-08-28: 1 [IU] via SUBCUTANEOUS
  Administered 2013-08-28: 2 [IU] via SUBCUTANEOUS
  Administered 2013-08-29 (×2): 1 [IU] via SUBCUTANEOUS
  Administered 2013-08-29: 3 [IU] via SUBCUTANEOUS
  Administered 2013-08-29: 2 [IU] via SUBCUTANEOUS

## 2013-08-19 MED ORDER — SODIUM CHLORIDE 0.9 % IV SOLN
INTRAVENOUS | Status: DC
  Administered 2013-08-19 – 2013-08-21 (×3): via INTRAVENOUS
  Administered 2013-08-21 – 2013-08-22 (×2): 75 mL/h via INTRAVENOUS
  Administered 2013-08-22 – 2013-08-24 (×3): via INTRAVENOUS
  Administered 2013-08-24: 500 mL via INTRAVENOUS
  Administered 2013-08-25: 75 mL/h via INTRAVENOUS
  Administered 2013-08-25 – 2013-08-26 (×2): via INTRAVENOUS

## 2013-08-19 MED ORDER — ACETAMINOPHEN 325 MG PO TABS: 650.0000 mg | ORAL_TABLET | Freq: Four times a day (QID) | ORAL | Status: DC | PRN

## 2013-08-19 NOTE — Consult Note (Signed)
Reason for Consult:Ventral hernias Referring Physician: Clifton Custard EDP  [name] is an 58 y.o. female.  HPI:  This is a 58 yo female with HTN, DM, and morbid obesity that presents with worsening epigastric abdominal pain, nausea, and vomiting.  The patient has noticed that she has had progressive worsening of "indigestion" over the last few months, with post-prandial bloating, mild abdominal pain, and nausea.  This is worse with pasta and spicy foods.  She had a total abdominal hysterectomy and LSO in 2006 by Dr. Raphael Gibney.  Over the last year or so, she has noticed that she develops pain above her bladder when she sneezes or coughs.    Past Medical History  Diagnosis Date  . Hypertension   . Diabetes mellitus without complication     PSH;  Total abdominal hysterectomy/ left salpingectomy 06/03/2005 - Dr. Raphael Gibney  No family history on file.  Social History:  reports that she has never smoked. She does not have any smokeless tobacco history on file. She reports that she does not drink alcohol. Her drug history is not on file.  Allergies:  Allergies  Allergen Reactions  . Penicillins Hives    Medications:  Prior to Admission medications   Medication Sig Start Date End Date Taking? Authorizing Provider  bimatoprost (LUMIGAN) 0.03 % ophthalmic solution Place 1 drop into both eyes at bedtime.   Yes Historical Provider, MD  glimepiride (AMARYL) 4 MG tablet Take 4 mg by mouth daily with breakfast.   Yes Historical Provider, MD  losartan (COZAAR) 25 MG tablet Take 25 mg by mouth daily.   Yes Historical Provider, MD  metFORMIN (GLUCOPHAGE) 500 MG tablet Take 500 mg by mouth 2 (two) times daily with a meal.   Yes Historical Provider, MD  pseudoephedrine-guaifenesin (MUCINEX D) 60-600 MG per tablet Take 1 tablet by mouth every 12 (twelve) hours.   Yes Historical Provider, MD  traMADol (ULTRAM) 50 MG tablet Take 50 mg by mouth every 6 (six) hours as needed (pain).  07/10/13  Yes  Historical Provider, MD     Results for orders placed during the hospital encounter of 08/19/13 (from the past 48 hour(s))  CBC WITH DIFFERENTIAL     Status: None   Collection Time    08/19/13  6:57 AM      Result Value Ref Range   WBC 8.4  4.0 - 10.5 K/uL   RBC 4.81  3.87 - 5.11 MIL/uL   Hemoglobin 14.3  12.0 - 15.0 g/dL   HCT 40.9  36.0 - 46.0 %   MCV 85.0  78.0 - 100.0 fL   MCH 29.7  26.0 - 34.0 pg   MCHC 35.0  30.0 - 36.0 g/dL   RDW 14.0  11.5 - 15.5 %   Platelets 341  150 - 400 K/uL   Neutrophils Relative % 55  43 - 77 %   Neutro Abs 4.6  1.7 - 7.7 K/uL   Lymphocytes Relative 34  12 - 46 %   Lymphs Abs 2.9  0.7 - 4.0 K/uL   Monocytes Relative 8  3 - 12 %   Monocytes Absolute 0.6  0.1 - 1.0 K/uL   Eosinophils Relative 3  0 - 5 %   Eosinophils Absolute 0.3  0.0 - 0.7 K/uL   Basophils Relative 0  0 - 1 %   Basophils Absolute 0.0  0.0 - 0.1 K/uL  COMPREHENSIVE METABOLIC PANEL     Status: Abnormal   Collection Time  08/19/13  6:57 AM      Result Value Ref Range   Sodium 140  137 - 147 mEq/L   Potassium 4.1  3.7 - 5.3 mEq/L   Chloride 100  96 - 112 mEq/L   CO2 27  19 - 32 mEq/L   Glucose, Bld 138 (*) 70 - 99 mg/dL   BUN 12  6 - 23 mg/dL   Creatinine, Ser 0.83  0.50 - 1.10 mg/dL   Calcium 9.4  8.4 - 10.5 mg/dL   Total Protein 7.8  6.0 - 8.3 g/dL   Albumin 3.7  3.5 - 5.2 g/dL   AST 11  0 - 37 U/L   ALT 13  0 - 35 U/L   Alkaline Phosphatase 97  39 - 117 U/L   Total Bilirubin 0.3  0.3 - 1.2 mg/dL   GFR calc non Af Amer 77 (*) >90 mL/min   GFR calc Af Amer 90 (*) >90 mL/min   Comment: (NOTE)     The eGFR has been calculated using the CKD EPI equation.     This calculation has not been validated in all clinical situations.     eGFR's persistently <90 mL/min signify possible Chronic Kidney     Disease.  LIPASE, BLOOD     Status: Abnormal   Collection Time    08/19/13  6:57 AM      Result Value Ref Range   Lipase 67 (*) 11 - 59 U/L  URINALYSIS, ROUTINE W REFLEX  MICROSCOPIC     Status: Abnormal   Collection Time    08/19/13  7:02 AM      Result Value Ref Range   Color, Urine YELLOW  YELLOW   APPearance CLOUDY (*) CLEAR   Specific Gravity, Urine 1.020  1.005 - 1.030   pH 5.5  5.0 - 8.0   Glucose, UA NEGATIVE  NEGATIVE mg/dL   Hgb urine dipstick NEGATIVE  NEGATIVE   Bilirubin Urine NEGATIVE  NEGATIVE   Ketones, ur NEGATIVE  NEGATIVE mg/dL   Protein, ur NEGATIVE  NEGATIVE mg/dL   Urobilinogen, UA 1.0  0.0 - 1.0 mg/dL   Nitrite NEGATIVE  NEGATIVE   Leukocytes, UA NEGATIVE  NEGATIVE   Comment: MICROSCOPIC NOT DONE ON URINES WITH NEGATIVE PROTEIN, BLOOD, LEUKOCYTES, NITRITE, OR GLUCOSE <1000 mg/dL.    Ct Abdomen Pelvis W Contrast  08/19/2013   CLINICAL DATA:  Abdominal pain, nausea  EXAM: CT ABDOMEN AND PELVIS WITH CONTRAST  TECHNIQUE: Multidetector CT imaging of the abdomen and pelvis was performed using the standard protocol following bolus administration of intravenous contrast.  CONTRAST:  159m OMNIPAQUE IOHEXOL 300 MG/ML  SOLN  COMPARISON:  07/19/2004  FINDINGS: Sagittal images of the spine shows mild degenerative changes thoracolumbar spine. Lung bases are unremarkable. There are streaky artifacts from patient's large body habitus. Heart size within normal limits. Liver, pancreas, spleen and adrenals are unremarkable. No calcified gallstones are noted within gallbladder. No aortic aneurysm.  CBD measures 8 mm in diameter.  There is a umbilical hernia containing fat without evidence of acute complication. Normal appendix. No pericecal inflammation. There is a midline lower abdominal wall hernia containing omental fat and small bowel loop measures 6.5 x 6.5 cm. This is best seen in axial image 72. There is mild distension of the bowel loop within hernia without definite evidence of obstruction. Minimal ileus at this level cannot be excluded. Follow-up examination is recommended to assure passage of contrast material within colon.  The urinary bladder is  unremarkable. The patient is status post hysterectomy.  No inguinal adenopathy. Kidneys are symmetrical in size and enhancement. No hydronephrosis or hydroureter.  Delayed renal images shows bilateral renal symmetrical excretion. Bilateral visualized proximal ureter is unremarkable.  IMPRESSION: 1. There is small umbilical hernia containing fat measures about 3.5 cm without evidence of acute complication. 2. There is a midline lower abdominal wall hernia containing omental fat and small bowel loop measures 6.5 x 6.5 cm. This is best seen in axial image 72. There is mild distension of the bowel loop within hernia without definite evidence of obstruction. Minimal ileus at this level cannot be excluded. Follow-up examination is recommended to assure passage of contrast material in colon. 3. No hydronephrosis or hydroureter. 4. Normal appendix.  No pericecal inflammation.   Electronically Signed   By: Lahoma Crocker M.D.   On: 08/19/2013 09:59    Review of Systems  Constitutional: Negative for weight loss.  HENT: Negative for ear discharge, ear pain, hearing loss and tinnitus.   Eyes: Negative for blurred vision, double vision, photophobia and pain.  Respiratory: Negative for cough, sputum production and shortness of breath.   Cardiovascular: Negative for chest pain.  Gastrointestinal: Positive for nausea, vomiting and abdominal pain (epigastric).  Genitourinary: Negative for dysuria, urgency, frequency and flank pain.  Musculoskeletal: Negative for back pain, falls, joint pain, myalgias and neck pain.  Neurological: Negative for dizziness, tingling, sensory change, focal weakness, loss of consciousness and headaches.  Endo/Heme/Allergies: Does not bruise/bleed easily.  Psychiatric/Behavioral: Negative for depression, memory loss and substance abuse. The patient is not nervous/anxious.    Blood pressure 119/70, pulse 63, temperature 98.2 F (36.8 C), temperature source Oral, resp. rate 16, weight 217 lb  (98.431 kg), SpO2 100.00%. Physical Exam WDWN in NAD HEENT:  EOMI, sclera anicteric Neck:  No masses, no thyromegaly Lungs:  CTA bilaterally; normal respiratory effort CV:  Regular rate and rhythm; no murmurs Abd:  +bowel sounds, obese, soft; tender in epigastrium; mildly tender in suprapubic region.  Unable to palpate ventral or umbilical hernias due to patient's body habitus Ext:  Well-perfused; no edema Skin:  Warm, dry; no sign of jaundice  Assessment/Plan: 1.  Mild pancreatitis - unclear etiology.  This seems to be her main complaint that brought her in to the ED today (epigastric pain, nausea, vomiting) 2.  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  No clear signs of bowel obstruction/ incarceration.  These do not seem to be her main source of symptoms at this time.  Recs:  Recommend abdominal ultrasound to rule out gallstones as source of pancreatitis.  NPO, IV hydration, recheck lipase in AM  Would repeat plain abdominal x-ray tomorrow to see if oral contrast has passed through the small bowel into the colon.  We will continue to follow the patient while she is in the hospital.  She will need repair of these hernias, but hopefully this can be done on an elective basis.  [name] K. 08/19/2013, 11:15 AM

## 2013-08-19 NOTE — H&P (Addendum)
[facility] History and Physical  [name] GYB:[id number] DOB: 1956/05/28 DOA: 08/19/2013  Referring physician: EDP PCP: No primary provider on file.   Chief Complaint:  Abdominal pain  HPI: [name] is a 58 y.o. female with PMH of DM, HTN, obesity, H/o Total Abdominal hysterectomy in 2007 presents to the ER from home today with the above complaint.  She reports abdominal pain, distention, nausea for the last 2 weeks, progressively worsened over the last 4 days. She also reports having soft stools for the last 2 weeks. Her pain is primarily lower abdominal, now more epigastric associated with worsening distension. No fevers or chills. In ER, labs unremarkably, lipase 67, CT abd with abd wall hernia with small bowel loop without definitive obstruction. Seen by CCS who recommended admission per [facility] for possible pancreatitis  Review of Systems: positives bolded Constitutional:  No weight loss, night sweats, Fevers, chills, fatigue.  HEENT:  No headaches, Difficulty swallowing,Tooth/dental problems,Sore throat,  No sneezing, itching, ear ache, nasal congestion, post nasal drip,  Cardio-vascular:  No chest pain, Orthopnea, PND, swelling in lower extremities, anasarca, dizziness, palpitations  GI:  No heartburn, indigestion, abdominal pain, nausea, vomiting, diarrhea, change in bowel habits, loss of appetite  Resp: cough, congestion for 1-2weeks No shortness of breath with exertion or at rest.  No wheezing.No chest wall deformity  Skin:  no rash or lesions.  GU:  no dysuria, change in color of urine, no urgency or frequency. No flank pain.  Musculoskeletal:  No joint pain or swelling. No decreased range of motion. No back pain.  Psych:  No change in mood or affect. No depression or anxiety. No memory loss.   Past Medical History  Diagnosis Date  . Hypertension   . Diabetes mellitus without complication    History reviewed. No pertinent past surgical  history. Social History:  reports that she has never smoked. She does not have any smokeless tobacco history on file. She reports that she does not drink alcohol. Her drug history is not on file.  Allergies  Allergen Reactions  . Penicillins Hives    No family history on file.   Prior to Admission medications   Medication Sig Start Date End Date Taking? Authorizing Provider  bimatoprost (LUMIGAN) 0.03 % ophthalmic solution Place 1 drop into both eyes at bedtime.   Yes Historical Provider, MD  glimepiride (AMARYL) 4 MG tablet Take 4 mg by mouth daily with breakfast.   Yes Historical Provider, MD  losartan (COZAAR) 25 MG tablet Take 25 mg by mouth daily.   Yes Historical Provider, MD  metFORMIN (GLUCOPHAGE) 500 MG tablet Take 500 mg by mouth 2 (two) times daily with a meal.   Yes Historical Provider, MD  pseudoephedrine-guaifenesin (MUCINEX D) 60-600 MG per tablet Take 1 tablet by mouth every 12 (twelve) hours.   Yes Historical Provider, MD  traMADol (ULTRAM) 50 MG tablet Take 50 mg by mouth every 6 (six) hours as needed (pain).  07/10/13  Yes Historical Provider, MD   Physical Exam: Filed Vitals:   08/19/13 1306  BP: 129/83  Pulse: 64  Temp: 98.2 F (36.8 C)  Resp: 19    BP 129/83  Pulse 64  Temp(Src) 98.2 F (36.8 C) (Oral)  Resp 19  Wt 98.431 kg (217 lb)  SpO2 90%  General:  Appears calm and comfortable Eyes: PERRL, normal lids, irises & conjunctiva ENT: grossly normal hearing, lips & tongue Neck: no LAD, masses or thyromegaly Cardiovascular: RRR, no m/r/g. No LE  edema. Telemetry: SR, no arrhythmias  Respiratory: CTA bilaterally, no w/r/r. Normal respiratory effort. Abdomen: soft, NT, mildly distended, large vertical incision, BS present Skin: no rash or induration seen on limited exam Musculoskeletal: grossly normal tone BUE/BLE Psychiatric: grossly normal mood and affect, speech fluent and appropriate Neurologic: grossly non-focal.          Labs on Admission:   Basic Metabolic Panel:  Recent Labs Lab 08/19/13 0657  NA 140  K 4.1  CL 100  CO2 27  GLUCOSE 138*  BUN 12  CREATININE 0.83  CALCIUM 9.4   Liver Function Tests:  Recent Labs Lab 08/19/13 0657  AST 11  ALT 13  ALKPHOS 97  BILITOT 0.3  PROT 7.8  ALBUMIN 3.7    Recent Labs Lab 08/19/13 0657  LIPASE 67*   No results found for this basename: AMMONIA,  in the last 168 hours CBC:  Recent Labs Lab 08/19/13 0657  WBC 8.4  NEUTROABS 4.6  HGB 14.3  HCT 40.9  MCV 85.0  PLT 341   Cardiac Enzymes: No results found for this basename: CKTOTAL, CKMB, CKMBINDEX, TROPONINI,  in the last 168 hours  BNP (last 3 results) No results found for this basename: PROBNP,  in the last 8760 hours CBG: No results found for this basename: GLUCAP,  in the last 168 hours  Radiological Exams on Admission: Ct Abdomen Pelvis W Contrast  08/19/2013   CLINICAL DATA:  Abdominal pain, nausea  EXAM: CT ABDOMEN AND PELVIS WITH CONTRAST  TECHNIQUE: Multidetector CT imaging of the abdomen and pelvis was performed using the standard protocol following bolus administration of intravenous contrast.  CONTRAST:  159mL OMNIPAQUE IOHEXOL 300 MG/ML  SOLN  COMPARISON:  07/19/2004  FINDINGS: Sagittal images of the spine shows mild degenerative changes thoracolumbar spine. Lung bases are unremarkable. There are streaky artifacts from patient's large body habitus. Heart size within normal limits. Liver, pancreas, spleen and adrenals are unremarkable. No calcified gallstones are noted within gallbladder. No aortic aneurysm.  CBD measures 8 mm in diameter.  There is a umbilical hernia containing fat without evidence of acute complication. Normal appendix. No pericecal inflammation. There is a midline lower abdominal wall hernia containing omental fat and small bowel loop measures 6.5 x 6.5 cm. This is best seen in axial image 72. There is mild distension of the bowel loop within hernia without definite evidence of  obstruction. Minimal ileus at this level cannot be excluded. Follow-up examination is recommended to assure passage of contrast material within colon.  The urinary bladder is unremarkable. The patient is status post hysterectomy.  No inguinal adenopathy. Kidneys are symmetrical in size and enhancement. No hydronephrosis or hydroureter.  Delayed renal images shows bilateral renal symmetrical excretion. Bilateral visualized proximal ureter is unremarkable.  IMPRESSION: 1. There is small umbilical hernia containing fat measures about 3.5 cm without evidence of acute complication. 2. There is a midline lower abdominal wall hernia containing omental fat and small bowel loop measures 6.5 x 6.5 cm. This is best seen in axial image 72. There is mild distension of the bowel loop within hernia without definite evidence of obstruction. Minimal ileus at this level cannot be excluded. Follow-up examination is recommended to assure passage of contrast material in colon. 3. No hydronephrosis or hydroureter. 4. Normal appendix.  No pericecal inflammation.   Electronically Signed   By: Lahoma Crocker M.D.   On: 08/19/2013 09:59    EKG: Independently reviewed. No acute ST T wave   Assessment/Plan  1.  Abdominal pain - I suspect her ongoing symptoms for the last few weeks are related to intermittent partial obstruction from her Abdominal hernia - lipase of 67 clinically very low suspicion for Acute pancreatitis, however given concern per EDP and CCS - will start clears, IVF, supportive care - agree with Abd [name] to eval for gall stones, no h/o ETOH use - CCS following, will need definitive management of hernias  2. DM -hold metformin, SSI  3. HTN -stable, continue losartan  4. Cough/URI -likely worsening abdominal symptoms/hernia etc recently -continue mucinex  DVT proph: lovenox  Code Status: Full Code Family Communication: none at bedside Disposition Plan: inpatient  Time spent: 59min  Siah Kannan [facility] Pager [phone]

## 2013-08-19 NOTE — Progress Notes (Signed)
NURSING PROGRESS NOTE  [name] [id number] Admission Data: 08/19/2013 6:02 PM Attending Provider: Domenic Polite, MD PCP:No primary provider on file. Code Status: Full  [name] is a 58 y.o. female patient admitted from ED:  -No acute distress noted.  -No complaints of shortness of breath.  -No complaints of chest pain.   Blood pressure 129/83, pulse 64, temperature 98.2 F (36.8 C), temperature source Oral, resp. rate 19, weight 98.431 kg (217 lb), SpO2 90.00%.   IV Fluids:  IV in place, occlusive dsg intact without redness, IV cath antecubital left, condition patent and no redness normal saline.   Allergies:  Penicillins  Past Medical History:   has a past medical history of Hypertension and Diabetes mellitus without complication.  Past Surgical History:   has no past surgical history on file.  Social History:   reports that she has never smoked. She does not have any smokeless tobacco history on file. She reports that she does not drink alcohol.  Skin: Intact  Patient/Family orientated to room. Information packet given to patient/family. Admission inpatient armband information verified with patient/family to include name and date of birth and placed on patient arm. Side rails up x 2, fall assessment and education completed with patient/family. Patient/family able to verbalize understanding of risk associated with falls and verbalized understanding to call for assistance before getting out of bed. Call light within reach. Patient/family able to voice and demonstrate understanding of unit orientation instructions.    Will continue to evaluate and treat per MD orders.

## 2013-08-19 NOTE — ED Notes (Signed)
Pt c/o abd pain  Nausea for 3 days  And she also has had lower back pain.

## 2013-08-19 NOTE — ED Provider Notes (Signed)
CSN: [id number]     Arrival date & time 08/19/13  6010 History   First MD Initiated Contact with Patient 08/19/13 [phone]     Chief Complaint  Patient presents with  . Abdominal Pain      HPI Patient presents with abdominal pain associated with nausea for the last 2-3 days.  Pain is also radiates into her back.  Patient had no definite vomiting.  Had previous surgical history of hysterectomy.  No history of gallbladder disease.  No fever or chills.  No dysuria urgency or frequency. Past Medical History  Diagnosis Date  . Hypertension   . Diabetes mellitus without complication    History reviewed. No pertinent past surgical history. No family history on file. History  Substance Use Topics  . Smoking status: Never Smoker   . Smokeless tobacco: Not on file  . Alcohol Use: No   OB History   Grav Para Term Preterm Abortions TAB SAB Ect Mult Living                 Review of Systems  All other systems reviewed and are negative  Allergies  Penicillins  Home Medications   Current Outpatient Rx  Name  Route  Sig  Dispense  Refill  . bimatoprost (LUMIGAN) 0.03 % ophthalmic solution   Both Eyes   Place 1 drop into both eyes at bedtime.         Marland Kitchen glimepiride (AMARYL) 4 MG tablet   Oral   Take 4 mg by mouth daily with breakfast.         . losartan (COZAAR) 25 MG tablet   Oral   Take 25 mg by mouth daily.         . metFORMIN (GLUCOPHAGE) 500 MG tablet   Oral   Take 500 mg by mouth 2 (two) times daily with a meal.         . pseudoephedrine-guaifenesin (MUCINEX D) 60-600 MG per tablet   Oral   Take 1 tablet by mouth every 12 (twelve) hours.         . traMADol (ULTRAM) 50 MG tablet   Oral   Take 50 mg by mouth every 6 (six) hours as needed (pain).           BP 119/70  Pulse 63  Temp(Src) 98.2 F (36.8 C) (Oral)  Resp 16  Wt 217 lb (98.431 kg)  SpO2 100% Physical Exam  Nursing note and vitals reviewed. Constitutional: She is oriented to person, place,  and time. She appears well-developed and well-nourished. No distress.  HENT:  Head: Normocephalic and atraumatic.  Eyes: Pupils are equal, round, and reactive to light.  Neck: Normal range of motion.  Cardiovascular: Normal rate and intact distal pulses.   Pulmonary/Chest: No respiratory distress.  Abdominal: Soft. Normal appearance. She exhibits no distension and no mass. There is tenderness (Epigastric area). There is no rebound and no guarding.  Musculoskeletal: Normal range of motion.  Neurological: She is alert and oriented to person, place, and time. No cranial nerve deficit.  Skin: Skin is warm and dry. No rash noted.  Psychiatric: She has a normal mood and affect. Her behavior is normal.    ED Course  Procedures (including critical care time) Medications  ondansetron (ZOFRAN) injection 4 mg (4 mg Intravenous Given 08/19/13 0846)  fentaNYL (SUBLIMAZE) injection 100 mcg (100 mcg Intravenous Given 08/19/13 0846)  iohexol (OMNIPAQUE) 300 MG/ML solution 25 mL (25 mLs Oral Contrast Given 08/19/13 0905)  iohexol (OMNIPAQUE)  300 MG/ML solution 100 mL (100 mLs Intravenous Contrast Given 08/19/13 0925)    Labs Review Labs Reviewed  URINALYSIS, ROUTINE W REFLEX MICROSCOPIC - Abnormal; Notable for the following:    APPearance CLOUDY (*)    All other components within normal limits  COMPREHENSIVE METABOLIC PANEL - Abnormal; Notable for the following:    Glucose, Bld 138 (*)    GFR calc non Af Amer 77 (*)    GFR calc Af Amer 90 (*)    All other components within normal limits  LIPASE, BLOOD - Abnormal; Notable for the following:    Lipase 67 (*)    All other components within normal limits  CBC WITH DIFFERENTIAL   Imaging Review Ct Abdomen Pelvis W Contrast  08/19/2013   CLINICAL DATA:  Abdominal pain, nausea  EXAM: CT ABDOMEN AND PELVIS WITH CONTRAST  TECHNIQUE: Multidetector CT imaging of the abdomen and pelvis was performed using the standard protocol following bolus administration  of intravenous contrast.  CONTRAST:  174mL OMNIPAQUE IOHEXOL 300 MG/ML  SOLN  COMPARISON:  07/19/2004  FINDINGS: Sagittal images of the spine shows mild degenerative changes thoracolumbar spine. Lung bases are unremarkable. There are streaky artifacts from patient's large body habitus. Heart size within normal limits. Liver, pancreas, spleen and adrenals are unremarkable. No calcified gallstones are noted within gallbladder. No aortic aneurysm.  CBD measures 8 mm in diameter.  There is a umbilical hernia containing fat without evidence of acute complication. Normal appendix. No pericecal inflammation. There is a midline lower abdominal wall hernia containing omental fat and small bowel loop measures 6.5 x 6.5 cm. This is best seen in axial image 72. There is mild distension of the bowel loop within hernia without definite evidence of obstruction. Minimal ileus at this level cannot be excluded. Follow-up examination is recommended to assure passage of contrast material within colon.  The urinary bladder is unremarkable. The patient is status post hysterectomy.  No inguinal adenopathy. Kidneys are symmetrical in size and enhancement. No hydronephrosis or hydroureter.  Delayed renal images shows bilateral renal symmetrical excretion. Bilateral visualized proximal ureter is unremarkable.  IMPRESSION: 1. There is small umbilical hernia containing fat measures about 3.5 cm without evidence of acute complication. 2. There is a midline lower abdominal wall hernia containing omental fat and small bowel loop measures 6.5 x 6.5 cm. This is best seen in axial image 72. There is mild distension of the bowel loop within hernia without definite evidence of obstruction. Minimal ileus at this level cannot be excluded. Follow-up examination is recommended to assure passage of contrast material in colon. 3. No hydronephrosis or hydroureter. 4. Normal appendix.  No pericecal inflammation.   Electronically Signed   By: Lahoma Crocker M.D.    On: 08/19/2013 09:59    Patient was seen and evaluated by general surgery.  They recommended ultrasound the abdomen and admission with followup KUB tomorrow.  General medicine contacted and will admit.  MDM   Final diagnoses:  Pancreatitis  Abdominal hernia        Dot Lanes, MD 08/19/13 [phone]

## 2013-08-19 NOTE — Progress Notes (Signed)
Pt is diabetic and is currently on a clear liquid diet. Provider on call paged to see if RN could have an order to check patients blood sugar and if coverage can be ordered. Provider Fredirick Maudlin placed an order for q 4 cbg and for sensitive sliding coverage to be given as needed. Will continue to monitor.   Jennine Peddy J. Conley Canal RN

## 2013-08-19 NOTE — Discharge Instructions (Signed)
Metformin and X-ray Contrast Studies °For some X-ray exams, a contrast dye is used. Contrast dye is a type of medicine used to make the X-ray image clearer. The contrast dye is given to the patient through a vein (intravenously). If you need to have this type of X-ray exam and you take a medication called metformin, your caregiver may have you stop taking metformin before the exam.  °LACTIC ACIDOSIS °In rare cases, a serious medical condition called lactic acidosis can develop in people who take metformin and receive contrast dye. The following conditions can increase the risk of this complication:  °· Kidney failure. °· Liver problems. °· Certain types of heart problems such as: °· Heart failure. °· Heart attack. °· Heart infection. °· Heart valve problems. °· Alcohol abuse. °If left untreated, lactic acidosis can lead to coma.  °SYMPTOMS OF LACTIC ACIDOSIS °Symptoms of lactic acidosis can include: °· Rapid breathing (hyperventilation). °· Neurologic symptoms such as: °· Headaches. °· Confusion. °· Dizziness. °· Excessive sweating. °· Feeling sick to your stomach (nauseous) or throwing up (vomiting). °AFTER THE X-RAY EXAM °· Stay well-hydrated. Drink fluids as instructed by your caregiver. °· If you have a risk of developing lactic acidosis, blood tests may be done to make sure your kidney function is okay. °· Metformin is usually stopped for 48 hours after the X-ray exam. Ask your caregiver when you can start taking metformin again. °SEEK MEDICAL CARE IF:  °· You have shortness of breath or difficulty breathing. °· You develop a headache that does not go away. °· You have nausea or vomiting. °· You urinate more than normal. °· You develop a skin rash and have: °· Redness. °· Swelling. °· Itching. °Document Released: 05/27/2009 Document Revised: 08/31/2011 Document Reviewed: 05/27/2009 °[employer]® Patient Information ©2014 [employer]. ° °

## 2013-08-20 ENCOUNTER — Inpatient Hospital Stay (HOSPITAL_COMMUNITY): Payer: BC Managed Care – PPO

## 2013-08-20 LAB — GLUCOSE, CAPILLARY
Glucose-Capillary: 112 mg/dL — ABNORMAL HIGH (ref 70–99)
Glucose-Capillary: 116 mg/dL — ABNORMAL HIGH (ref 70–99)
Glucose-Capillary: 122 mg/dL — ABNORMAL HIGH (ref 70–99)
Glucose-Capillary: 127 mg/dL — ABNORMAL HIGH (ref 70–99)
Glucose-Capillary: 132 mg/dL — ABNORMAL HIGH (ref 70–99)
Glucose-Capillary: 224 mg/dL — ABNORMAL HIGH (ref 70–99)

## 2013-08-20 LAB — CBC
HCT: 39.7 % (ref 36.0–46.0)
Hemoglobin: 13 g/dL (ref 12.0–15.0)
MCH: 28.5 pg (ref 26.0–34.0)
MCHC: 32.7 g/dL (ref 30.0–36.0)
MCV: 87.1 fL (ref 78.0–100.0)
Platelets: 314 10*3/uL (ref 150–400)
RBC: 4.56 MIL/uL (ref 3.87–5.11)
RDW: 14.3 % (ref 11.5–15.5)
WBC: 7.5 10*3/uL (ref 4.0–10.5)

## 2013-08-20 LAB — COMPREHENSIVE METABOLIC PANEL
ALT: 11 U/L (ref 0–35)
AST: 10 U/L (ref 0–37)
Albumin: 3.2 g/dL — ABNORMAL LOW (ref 3.5–5.2)
Alkaline Phosphatase: 86 U/L (ref 39–117)
BUN: 10 mg/dL (ref 6–23)
CO2: 28 mEq/L (ref 19–32)
Calcium: 9.1 mg/dL (ref 8.4–10.5)
Chloride: 102 mEq/L (ref 96–112)
Creatinine, Ser: 0.79 mg/dL (ref 0.50–1.10)
GFR calc Af Amer: >90 mL/min (ref >90)
GFR calc non Af Amer: >90 mL/min (ref >90)
Glucose, Bld: 137 mg/dL — ABNORMAL HIGH (ref 70–99)
Potassium: 4.5 mEq/L (ref 3.7–5.3)
Sodium: 140 mEq/L (ref 137–147)
Total Bilirubin: 0.3 mg/dL (ref 0.3–1.2)
Total Protein: 6.9 g/dL (ref 6.0–8.3)

## 2013-08-20 LAB — LIPASE, BLOOD: Lipase: 49 U/L (ref 11–59)

## 2013-08-20 MED ORDER — MORPHINE SULFATE 2 MG/ML IJ SOLN
1.0000 mg | INTRAMUSCULAR | Status: DC | PRN
  Administered 2013-08-20 – 2013-08-21 (×5): 2 mg via INTRAVENOUS
  Filled 2013-08-20 (×5): qty 1

## 2013-08-20 MED ORDER — PANTOPRAZOLE SODIUM 40 MG IV SOLR
40.0000 mg | INTRAVENOUS | Status: DC
  Administered 2013-08-20 – 2013-08-21 (×2): 40 mg via INTRAVENOUS
  Filled 2013-08-20 (×2): qty 40

## 2013-08-20 MED ORDER — FENTANYL CITRATE 0.05 MG/ML IJ SOLN
12.5000 ug | INTRAMUSCULAR | Status: DC | PRN
  Administered 2013-08-20 (×2): 12.5 ug via INTRAVENOUS
  Filled 2013-08-20 (×3): qty 2

## 2013-08-20 NOTE — Progress Notes (Signed)
Subjective: She still is having intermittent epigastric pain  Objective: Vital signs in last 24 hours: Temp:  [97.7 F (36.5 C)-98.2 F (36.8 C)] 97.7 F (36.5 C) (03/01 0542) Pulse Rate:  [56-65] 56 (03/01 0542) Resp:  [16-19] 18 (03/01 0542) BP: (116-129)/(70-83) 116/77 mmHg (03/01 0542) SpO2:  [90 %-100 %] 96 % (03/01 0542) Last BM Date: 08/18/13  Intake/Output from previous day: 02/28 0701 - 03/01 0700 In: 911.3 [I.V.:911.3] Out: -  Intake/Output this shift:    Looks comfortable Abdomen is totally soft, I get no guarding or tenderness in the RUQ  Lab Results:   Recent Labs  08/19/13 0657 08/20/13 0550  WBC 8.4 7.5  HGB 14.3 13.0  HCT 40.9 39.7  PLT 341 314   BMET  Recent Labs  08/19/13 0657 08/20/13 0550  NA 140 140  K 4.1 4.5  CL 100 102  CO2 27 28  GLUCOSE 138* 137*  BUN 12 10  CREATININE 0.83 0.79  CALCIUM 9.4 9.1   PT/INR No results found for this basename: LABPROT, INR,  in the last 72 hours ABG No results found for this basename: PHART, PCO2, PO2, HCO3,  in the last 72 hours  Studies/Results: Dg Abd 1 View  08/20/2013   CLINICAL DATA:  Follow-up contrast progression status post abdominal CT scan, to assess for bowel obstruction. Known ventral abdominal wall hernia.  EXAM: ABDOMEN - 1 VIEW  COMPARISON:  None.  FINDINGS: Contrast has progressed normally into the colon, to the level of the sigmoid colon. There is no evidence for bowel obstruction. The appendix is also filled with contrast.  As noted on recent CT, the patient's periumbilical hernia contains only fat. There is no evidence for bowel herniation. The bowel gas pattern is unremarkable. No free intra-abdominal air is identified, though evaluation for free air is limited on supine views.  No acute osseous abnormalities are seen. There is degenerative change at the right hip, with axial joint space narrowing, sclerosis and subcortical cystic change.  IMPRESSION: 1. Contrast has progressed  normally into the colon, to the level of the sigmoid colon. No evidence for bowel obstruction. 2. As noted on recent CT, the patient's periumbilical hernia contains only fat. There is no evidence for bowel herniation at this time. 3. Degenerative change again noted at the right hip, with axial joint space narrowing, sclerosis and subcortical cystic change.   Electronically Signed   By: Garald Balding M.D.   On: 08/20/2013 00:47   US Abdomen Complete  08/19/2013   CLINICAL DATA:  Abdominal pain.  EXAM: ULTRASOUND ABDOMEN COMPLETE  COMPARISON:  CT 08/19/2013  FINDINGS: Gallbladder:  No gallstones or wall thickening visualized. No sonographic Murphy sign noted.  Common bile duct:  Diameter: 5.4 mm.  Liver:  Mild fatty infiltration without focal mass.  IVC:  No abnormality visualized.  Pancreas:  Visualized portion unremarkable.  Spleen:  Size and appearance within normal limits.  Right Kidney:  Length: 10.1 cm. Echogenicity within normal limits. No mass or hydronephrosis visualized.  Left Kidney:  Length: 10.0 cm. Echogenicity within normal limits. No mass or hydronephrosis visualized.  Abdominal aorta:  No aneurysm visualized.  Other findings:  None.  IMPRESSION: No acute hepatobiliary findings.  Minimal fatty infiltration of the liver.   Electronically Signed   By: Marin Olp M.D.   On: 08/19/2013 15:52   Ct Abdomen Pelvis W Contrast  08/19/2013   CLINICAL DATA:  Abdominal pain, nausea  EXAM: CT ABDOMEN AND PELVIS WITH CONTRAST  TECHNIQUE: Multidetector CT imaging of the abdomen and pelvis was performed using the standard protocol following bolus administration of intravenous contrast.  CONTRAST:  160mL OMNIPAQUE IOHEXOL 300 MG/ML  SOLN  COMPARISON:  07/19/2004  FINDINGS: Sagittal images of the spine shows mild degenerative changes thoracolumbar spine. Lung bases are unremarkable. There are streaky artifacts from patient's large body habitus. Heart size within normal limits. Liver, pancreas, spleen and  adrenals are unremarkable. No calcified gallstones are noted within gallbladder. No aortic aneurysm.  CBD measures 8 mm in diameter.  There is a umbilical hernia containing fat without evidence of acute complication. Normal appendix. No pericecal inflammation. There is a midline lower abdominal wall hernia containing omental fat and small bowel loop measures 6.5 x 6.5 cm. This is best seen in axial image 72. There is mild distension of the bowel loop within hernia without definite evidence of obstruction. Minimal ileus at this level cannot be excluded. Follow-up examination is recommended to assure passage of contrast material within colon.  The urinary bladder is unremarkable. The patient is status post hysterectomy.  No inguinal adenopathy. Kidneys are symmetrical in size and enhancement. No hydronephrosis or hydroureter.  Delayed renal images shows bilateral renal symmetrical excretion. Bilateral visualized proximal ureter is unremarkable.  IMPRESSION: 1. There is small umbilical hernia containing fat measures about 3.5 cm without evidence of acute complication. 2. There is a midline lower abdominal wall hernia containing omental fat and small bowel loop measures 6.5 x 6.5 cm. This is best seen in axial image 72. There is mild distension of the bowel loop within hernia without definite evidence of obstruction. Minimal ileus at this level cannot be excluded. Follow-up examination is recommended to assure passage of contrast material in colon. 3. No hydronephrosis or hydroureter. 4. Normal appendix.  No pericecal inflammation.   Electronically Signed   By: Lahoma Crocker M.D.   On: 08/19/2013 09:59    Anti-infectives: Anti-infectives   None      Assessment/Plan: s/p * No surgery found *  Abdominal pain of uncertain etiology  Ultrasound neg for gallstones or gallbladder wall thickening.  The CBD is normal.  Her LFT's, WBC, and lipase are normal.  The plain abdominal xray shows contrast now in the colon  with no evidence of obstruction. She may need GI consult if pain persists to consider EGD. Would also need to see surgery as outpt to consider hernia repair electively No other recommendations at this point  LOS: 1 day    Tanga Gloor A 08/20/2013

## 2013-08-20 NOTE — Progress Notes (Signed)
Patient is in pain but is refusing to take her fentanyl. States that this pain medicine does not work and asked was there anything besides fentanyl and dilaudid that she could take. States she remembers being on morphine for a previous surgery and that seemed to control her pain. Provider on call was paged. Fredirick Maudlin was told about the patient's concerns about her pain medicine. Rogue Bussing ordered for morphine to be ordered. Will continue to monitor.  Klarisa Barman J. Conley Canal RN

## 2013-08-20 NOTE — Progress Notes (Addendum)
[facility] PROGRESS NOTE  [name] QQV:[id number] DOB: 26-Jun-1955 DOA: 08/19/2013 PCP: No primary provider on file.  Assessment/Plan: 1. Abdominal pain/Nausea/Vomiting   - I still suspect her ongoing symptoms for the last few weeks are related to intermittent partial obstruction from her Abdominal hernia  - lipase of 67 on admission and now 49 clinically do not suspect Acute pancreatitis any more  - continue clears, IVF, supportive care, advance diet as tolerated - Abd [name] negative for gall stones,  - CCS following  2. DM  -hold metformin, SSI   3. HTN  -stable, continue losartan   4. Cough/URI  -likely worsening abdominal symptoms/hernia etc recently  -continue mucinex   DVT proph: lovenox  Code Status: Full Code Family Communication: none at bedside Disposition Plan: home when stable   Consultants:  CCS  HPI/Subjective: Vomited clears this am  Objective: Filed Vitals:   08/20/13 0542  BP: 116/77  Pulse: 56  Temp: 97.7 F (36.5 C)  Resp: 18    Intake/Output Summary (Last 24 hours) at 08/20/13 1315 Last data filed at 08/20/13 0900  Gross per 24 hour  Intake 1271.25 ml  Output      0 ml  Net 1271.25 ml   Filed Weights   08/19/13 0655  Weight: 98.431 kg (217 lb)    Exam:   General:  AAOx3, no distress  Cardiovascular: S1S2/RRR  Respiratory: CTAB  Abdomen: soft, mildly distended, BS present  Musculoskeletal: no edema c/c   Data Reviewed: Basic Metabolic Panel:  Recent Labs Lab 08/19/13 0657 08/20/13 0550  NA 140 140  K 4.1 4.5  CL 100 102  CO2 27 28  GLUCOSE 138* 137*  BUN 12 10  CREATININE 0.83 0.79  CALCIUM 9.4 9.1   Liver Function Tests:  Recent Labs Lab 08/19/13 0657 08/20/13 0550  AST 11 10  ALT 13 11  ALKPHOS 97 86  BILITOT 0.3 0.3  PROT 7.8 6.9  ALBUMIN 3.7 3.2*    Recent Labs Lab 08/19/13 0657 08/20/13 0550  LIPASE 67* 49   No results found for this basename: AMMONIA,  in the last 168  hours CBC:  Recent Labs Lab 08/19/13 0657 08/20/13 0550  WBC 8.4 7.5  NEUTROABS 4.6  --   HGB 14.3 13.0  HCT 40.9 39.7  MCV 85.0 87.1  PLT 341 314   Cardiac Enzymes: No results found for this basename: CKTOTAL, CKMB, CKMBINDEX, TROPONINI,  in the last 168 hours BNP (last 3 results) No results found for this basename: PROBNP,  in the last 8760 hours CBG:  Recent Labs Lab 08/20/13 0008 08/20/13 0414 08/20/13 0754 08/20/13 1152  GLUCAP 127* 132* 122* 224*    No results found for this or any previous visit (from the past 240 hour(s)).   Studies: Dg Abd 1 View  08/20/2013   CLINICAL DATA:  Follow-up contrast progression status post abdominal CT scan, to assess for bowel obstruction. Known ventral abdominal wall hernia.  EXAM: ABDOMEN - 1 VIEW  COMPARISON:  None.  FINDINGS: Contrast has progressed normally into the colon, to the level of the sigmoid colon. There is no evidence for bowel obstruction. The appendix is also filled with contrast.  As noted on recent CT, the patient's periumbilical hernia contains only fat. There is no evidence for bowel herniation. The bowel gas pattern is unremarkable. No free intra-abdominal air is identified, though evaluation for free air is limited on supine views.  No acute osseous abnormalities are seen. There is degenerative change  at the right hip, with axial joint space narrowing, sclerosis and subcortical cystic change.  IMPRESSION: 1. Contrast has progressed normally into the colon, to the level of the sigmoid colon. No evidence for bowel obstruction. 2. As noted on recent CT, the patient's periumbilical hernia contains only fat. There is no evidence for bowel herniation at this time. 3. Degenerative change again noted at the right hip, with axial joint space narrowing, sclerosis and subcortical cystic change.   Electronically Signed   By: Garald Balding M.D.   On: 08/20/2013 00:47   US Abdomen Complete  08/19/2013   CLINICAL DATA:  Abdominal  pain.  EXAM: ULTRASOUND ABDOMEN COMPLETE  COMPARISON:  CT 08/19/2013  FINDINGS: Gallbladder:  No gallstones or wall thickening visualized. No sonographic Murphy sign noted.  Common bile duct:  Diameter: 5.4 mm.  Liver:  Mild fatty infiltration without focal mass.  IVC:  No abnormality visualized.  Pancreas:  Visualized portion unremarkable.  Spleen:  Size and appearance within normal limits.  Right Kidney:  Length: 10.1 cm. Echogenicity within normal limits. No mass or hydronephrosis visualized.  Left Kidney:  Length: 10.0 cm. Echogenicity within normal limits. No mass or hydronephrosis visualized.  Abdominal aorta:  No aneurysm visualized.  Other findings:  None.  IMPRESSION: No acute hepatobiliary findings.  Minimal fatty infiltration of the liver.   Electronically Signed   By: Marin Olp M.D.   On: 08/19/2013 15:52   Ct Abdomen Pelvis W Contrast  08/19/2013   CLINICAL DATA:  Abdominal pain, nausea  EXAM: CT ABDOMEN AND PELVIS WITH CONTRAST  TECHNIQUE: Multidetector CT imaging of the abdomen and pelvis was performed using the standard protocol following bolus administration of intravenous contrast.  CONTRAST:  148mL OMNIPAQUE IOHEXOL 300 MG/ML  SOLN  COMPARISON:  07/19/2004  FINDINGS: Sagittal images of the spine shows mild degenerative changes thoracolumbar spine. Lung bases are unremarkable. There are streaky artifacts from patient's large body habitus. Heart size within normal limits. Liver, pancreas, spleen and adrenals are unremarkable. No calcified gallstones are noted within gallbladder. No aortic aneurysm.  CBD measures 8 mm in diameter.  There is a umbilical hernia containing fat without evidence of acute complication. Normal appendix. No pericecal inflammation. There is a midline lower abdominal wall hernia containing omental fat and small bowel loop measures 6.5 x 6.5 cm. This is best seen in axial image 72. There is mild distension of the bowel loop within hernia without definite evidence of  obstruction. Minimal ileus at this level cannot be excluded. Follow-up examination is recommended to assure passage of contrast material within colon.  The urinary bladder is unremarkable. The patient is status post hysterectomy.  No inguinal adenopathy. Kidneys are symmetrical in size and enhancement. No hydronephrosis or hydroureter.  Delayed renal images shows bilateral renal symmetrical excretion. Bilateral visualized proximal ureter is unremarkable.  IMPRESSION: 1. There is small umbilical hernia containing fat measures about 3.5 cm without evidence of acute complication. 2. There is a midline lower abdominal wall hernia containing omental fat and small bowel loop measures 6.5 x 6.5 cm. This is best seen in axial image 72. There is mild distension of the bowel loop within hernia without definite evidence of obstruction. Minimal ileus at this level cannot be excluded. Follow-up examination is recommended to assure passage of contrast material in colon. 3. No hydronephrosis or hydroureter. 4. Normal appendix.  No pericecal inflammation.   Electronically Signed   By: Lahoma Crocker M.D.   On: 08/19/2013 09:59    Scheduled  Meds: . enoxaparin (LOVENOX) injection  40 mg Subcutaneous Q24H  . insulin aspart  0-9 Units Subcutaneous 6 times per day  . latanoprost  1 drop Both Eyes QHS  . pantoprazole (PROTONIX) IV  40 mg Intravenous Q24H   Continuous Infusions: . sodium chloride 75 mL/hr at 08/20/13 0436   Antibiotics Given (last 72 hours)   None      Active Problems:   Abdominal pain   Diabetes mellitus   Essential hypertension, benign   Ventral hernia    Time spent: 68min    Zikeria Keough  [facility] Pager [phone]. If 7PM-7AM, please contact night-coverage at [email or web address], password [facility] 08/20/2013, 1:15 PM  LOS: 1 day

## 2013-08-21 ENCOUNTER — Encounter (HOSPITAL_COMMUNITY): Payer: Self-pay | Admitting: Physician Assistant

## 2013-08-21 DIAGNOSIS — R933 Abnormal findings on diagnostic imaging of other parts of digestive tract: Secondary | ICD-10-CM

## 2013-08-21 DIAGNOSIS — K439 Ventral hernia without obstruction or gangrene: Secondary | ICD-10-CM

## 2013-08-21 LAB — COMPREHENSIVE METABOLIC PANEL
ALT: 10 U/L (ref 0–35)
AST: 9 U/L (ref 0–37)
Albumin: 3.2 g/dL — ABNORMAL LOW (ref 3.5–5.2)
Alkaline Phosphatase: 88 U/L (ref 39–117)
BUN: 8 mg/dL (ref 6–23)
CO2: 26 mEq/L (ref 19–32)
Calcium: 9 mg/dL (ref 8.4–10.5)
Chloride: 101 mEq/L (ref 96–112)
Creatinine, Ser: 0.78 mg/dL (ref 0.50–1.10)
GFR calc Af Amer: >90 mL/min (ref >90)
GFR calc non Af Amer: >90 mL/min (ref >90)
Glucose, Bld: 135 mg/dL — ABNORMAL HIGH (ref 70–99)
Potassium: 4 mEq/L (ref 3.7–5.3)
Sodium: 138 mEq/L (ref 137–147)
Total Bilirubin: 0.3 mg/dL (ref 0.3–1.2)
Total Protein: 6.8 g/dL (ref 6.0–8.3)

## 2013-08-21 LAB — GLUCOSE, CAPILLARY
Glucose-Capillary: 126 mg/dL — ABNORMAL HIGH (ref 70–99)
Glucose-Capillary: 130 mg/dL — ABNORMAL HIGH (ref 70–99)
Glucose-Capillary: 134 mg/dL — ABNORMAL HIGH (ref 70–99)
Glucose-Capillary: 145 mg/dL — ABNORMAL HIGH (ref 70–99)
Glucose-Capillary: 152 mg/dL — ABNORMAL HIGH (ref 70–99)
Glucose-Capillary: 153 mg/dL — ABNORMAL HIGH (ref 70–99)

## 2013-08-21 MED ORDER — PANTOPRAZOLE SODIUM 40 MG PO TBEC
40.0000 mg | DELAYED_RELEASE_TABLET | Freq: Every day | ORAL | Status: DC
  Administered 2013-08-22 – 2013-08-29 (×7): 40 mg via ORAL
  Filled 2013-08-21 (×7): qty 1

## 2013-08-21 MED ORDER — METOCLOPRAMIDE HCL 10 MG PO TABS: 5.0000 mg | ORAL_TABLET | Freq: Three times a day (TID) | ORAL | Status: DC | PRN

## 2013-08-21 MED ORDER — PEG-KCL-NACL-NASULF-NA ASC-C 100 G PO SOLR
0.5000 | Freq: Once | ORAL | Status: AC
  Administered 2013-08-21: 100 g via ORAL
  Filled 2013-08-21: qty 1

## 2013-08-21 MED ORDER — PEG-KCL-NACL-NASULF-NA ASC-C 100 G PO SOLR
0.5000 | Freq: Once | ORAL | Status: AC
  Administered 2013-08-22: 100 g via ORAL
  Filled 2013-08-21: qty 1

## 2013-08-21 MED ORDER — HYDROCODONE-ACETAMINOPHEN 5-325 MG PO TABS
1.0000 | ORAL_TABLET | Freq: Four times a day (QID) | ORAL | Status: DC | PRN
  Administered 2013-08-21 – 2013-08-27 (×9): 1 via ORAL
  Filled 2013-08-21 (×9): qty 1

## 2013-08-21 MED ORDER — PEG-KCL-NACL-NASULF-NA ASC-C 100 G PO SOLR: 1.0000 | Freq: Once | ORAL | Status: DC

## 2013-08-21 NOTE — Progress Notes (Signed)
Subjective: No improvement overall.  Pt states her abdominal pain is about the same as when she arrived at the hospital in between doses of pain medicine.  She states she has been passing less and less gas, and denies a bowel movement since being admitted to the hospital. She denies N/V but states her pain has decreased her appetite.  Objective: Vital signs in last 24 hours: Temp:  [98.2 F (36.8 C)-98.5 F (36.9 C)] 98.2 F (36.8 C) (03/02 0426) Pulse Rate:  [56-68] 60 (03/02 0426) Resp:  [18] 18 (03/02 0426) BP: (119-142)/(75-85) 120/75 mmHg (03/02 0426) SpO2:  [94 %-98 %] 94 % (03/02 0426) Last BM Date: 08/18/13  Intake/Output from previous day: 09-20-22 0701 - 03/02 0700 In: 2242.5 [P.O.:360; I.V.:1882.5] Out: -  Intake/Output this shift:    PE: Gen:  Alert, NAD, pleasant Card:  RRR, no M/G/R heard Pulm:  CTA, no W/R/R, normal effort. Abd: Soft, hypoactive bowel sounds.  Non distended. Very mild diffuse tenderness, but mostly in RUQ.     Lab Results:   Recent Labs  08/19/13 0657 09-19-13 0550  WBC 8.4 7.5  HGB 14.3 13.0  HCT 40.9 39.7  PLT 341 314   BMET  Recent Labs  2013/09/19 0550 08/21/13 0603  NA 140 138  K 4.5 4.0  CL 102 101  CO2 28 26  GLUCOSE 137* 135*  BUN 10 8  CREATININE 0.79 0.78  CALCIUM 9.1 9.0   PT/INR No results found for this basename: LABPROT, INR,  in the last 72 hours CMP     Component Value Date/Time   NA 138 08/21/2013 0603   K 4.0 08/21/2013 0603   CL 101 08/21/2013 0603   CO2 26 08/21/2013 0603   GLUCOSE 135* 08/21/2013 0603   BUN 8 08/21/2013 0603   CREATININE 0.78 08/21/2013 0603   CALCIUM 9.0 08/21/2013 0603   PROT 6.8 08/21/2013 0603   ALBUMIN 3.2* 08/21/2013 0603   AST 9 08/21/2013 0603   ALT 10 08/21/2013 0603   ALKPHOS 88 08/21/2013 0603   BILITOT 0.3 08/21/2013 0603   GFRNONAA >90 08/21/2013 0603   GFRAA >90 08/21/2013 0603   Lipase     Component Value Date/Time   LIPASE 49 2013-09-19 0550       Studies/Results: Dg Abd 1  View  2013/09/19   CLINICAL DATA:  Follow-up contrast progression status post abdominal CT scan, to assess for bowel obstruction. Known ventral abdominal wall hernia.  EXAM: ABDOMEN - 1 VIEW  COMPARISON:  None.  FINDINGS: Contrast has progressed normally into the colon, to the level of the sigmoid colon. There is no evidence for bowel obstruction. The appendix is also filled with contrast.  As noted on recent CT, the patient's periumbilical hernia contains only fat. There is no evidence for bowel herniation. The bowel gas pattern is unremarkable. No free intra-abdominal air is identified, though evaluation for free air is limited on supine views.  No acute osseous abnormalities are seen. There is degenerative change at the right hip, with axial joint space narrowing, sclerosis and subcortical cystic change.  IMPRESSION: 1. Contrast has progressed normally into the colon, to the level of the sigmoid colon. No evidence for bowel obstruction. 2. As noted on recent CT, the patient's periumbilical hernia contains only fat. There is no evidence for bowel herniation at this time. 3. Degenerative change again noted at the right hip, with axial joint space narrowing, sclerosis and subcortical cystic change.   Electronically Signed   By: Jacqulynn Cadet  Chang M.D.   On: 08/20/2013 00:47   US Abdomen Complete  08/19/2013   CLINICAL DATA:  Abdominal pain.  EXAM: ULTRASOUND ABDOMEN COMPLETE  COMPARISON:  CT 08/19/2013  FINDINGS: Gallbladder:  No gallstones or wall thickening visualized. No sonographic Murphy sign noted.  Common bile duct:  Diameter: 5.4 mm.  Liver:  Mild fatty infiltration without focal mass.  IVC:  No abnormality visualized.  Pancreas:  Visualized portion unremarkable.  Spleen:  Size and appearance within normal limits.  Right Kidney:  Length: 10.1 cm. Echogenicity within normal limits. No mass or hydronephrosis visualized.  Left Kidney:  Length: 10.0 cm. Echogenicity within normal limits. No mass or hydronephrosis  visualized.  Abdominal aorta:  No aneurysm visualized.  Other findings:  None.  IMPRESSION: No acute hepatobiliary findings.  Minimal fatty infiltration of the liver.   Electronically Signed   By: Marin Olp M.D.   On: 08/19/2013 15:52   Ct Abdomen Pelvis W Contrast  08/19/2013   CLINICAL DATA:  Abdominal pain, nausea  EXAM: CT ABDOMEN AND PELVIS WITH CONTRAST  TECHNIQUE: Multidetector CT imaging of the abdomen and pelvis was performed using the standard protocol following bolus administration of intravenous contrast.  CONTRAST:  111mL OMNIPAQUE IOHEXOL 300 MG/ML  SOLN  COMPARISON:  07/19/2004  FINDINGS: Sagittal images of the spine shows mild degenerative changes thoracolumbar spine. Lung bases are unremarkable. There are streaky artifacts from patient's large body habitus. Heart size within normal limits. Liver, pancreas, spleen and adrenals are unremarkable. No calcified gallstones are noted within gallbladder. No aortic aneurysm.  CBD measures 8 mm in diameter.  There is a umbilical hernia containing fat without evidence of acute complication. Normal appendix. No pericecal inflammation. There is a midline lower abdominal wall hernia containing omental fat and small bowel loop measures 6.5 x 6.5 cm. This is best seen in axial image 72. There is mild distension of the bowel loop within hernia without definite evidence of obstruction. Minimal ileus at this level cannot be excluded. Follow-up examination is recommended to assure passage of contrast material within colon.  The urinary bladder is unremarkable. The patient is status post hysterectomy.  No inguinal adenopathy. Kidneys are symmetrical in size and enhancement. No hydronephrosis or hydroureter.  Delayed renal images shows bilateral renal symmetrical excretion. Bilateral visualized proximal ureter is unremarkable.  IMPRESSION: 1. There is small umbilical hernia containing fat measures about 3.5 cm without evidence of acute complication. 2. There is a  midline lower abdominal wall hernia containing omental fat and small bowel loop measures 6.5 x 6.5 cm. This is best seen in axial image 72. There is mild distension of the bowel loop within hernia without definite evidence of obstruction. Minimal ileus at this level cannot be excluded. Follow-up examination is recommended to assure passage of contrast material in colon. 3. No hydronephrosis or hydroureter. 4. Normal appendix.  No pericecal inflammation.   Electronically Signed   By: Lahoma Crocker M.D.   On: 08/19/2013 09:59    Anti-infectives: Anti-infectives   None       Assessment/Plan Abdominal pain of unknown etiology: GI to consult for consideration of Upper Endoscopy for gastric/duodenal ulcers Multiple Hernias: No apparent acute problem.  Consider elective outpatient correction. Comorbidities: Continue home meds VTE prophylaxis: Lovenox, ambulation, and IS    LOS: 2 days    [name] A. Shanasia Ibrahim, [place] 08/21/2013, [facility] Phone #: [phone]

## 2013-08-21 NOTE — Progress Notes (Signed)
Patient interviewed and examined, agree with PA note above. Only abnormality found so far is incisional hernias which are soft and do not appear incarcerated and transiently elevated lipase of uncertain significance. I think she needs further W/U before assuming this is secondary to her hernia Edward Jolly MD, FACS  08/21/2013 10:00 AM

## 2013-08-21 NOTE — Consult Note (Signed)
[facility] Consult: 1:30 PM 08/21/2013  LOS: 2 days    Referring Provider: Domenic Polite MD Primary Care Physician:  Arlyce Dice MD Primary Gastroenterologist:  Dr.  Althia Forts.     Reason for Consultation:  Nausea and abdominal pain.    HPI: [name] is a 58 y.o. female.  PMH of DM, HTN, obesity, H/o Total Abdominal hysterectomy/SPO in 2006  2 weeks of progressive abdominal pain, initially in mid abdomen, more recently radiating in band pattern around into mid to low back.  Nausea without emesis (vomitted once after pain med yesterday).  Admitted 08/19/13.  + sweats but normal WBC count and no fever.  No pain increase after food/drink.  Appetite moderately reduced.  No recent weight loss.  No hx of heartburn or of NSAID use.  Never had EGD of colonoscopy.  Initial lipase was 67, now 49.  LFTs consistently normal.  CT scan and ultrasound show hernias at periumbilical and lower ventral abdominal wall.  These are not causing obstruction but there was distended loop of bowel vs focal ileus on the CT scan.   Surgery not yet committed to laparoscopy/laparotomy.  Not sure the hernias are causing the sxs.  Would like GI endoscopic evaluation before making decision about surgery.     Past Medical History  Diagnosis Date  . Hypertension   . Diabetes mellitus without complication   . Anemia 2006    required transfusion post TAH/BSO 05/2005    Past Surgical History  Procedure Laterality Date  . Total abdominal hysterectomy w/ bilateral salpingoophorectomy  05/2005  . Hysteroscopy w/d&c  01/2005    for uterine fibroids.     Prior to Admission medications   Medication Sig Start Date End Date Taking? Authorizing Provider  bimatoprost (LUMIGAN) 0.03 % ophthalmic solution Place 1 drop into both eyes at  bedtime.   Yes Historical Provider, MD  glimepiride (AMARYL) 4 MG tablet Take 4 mg by mouth daily with breakfast.   Yes Historical Provider, MD  losartan (COZAAR) 25 MG tablet Take 25 mg by mouth daily.   Yes Historical Provider, MD  metFORMIN (GLUCOPHAGE) 500 MG tablet Take 500 mg by mouth 2 (two) times daily with a meal.   Yes Historical Provider, MD  pseudoephedrine-guaifenesin (MUCINEX D) 60-600 MG per tablet Take 1 tablet by mouth every 12 (twelve) hours.   Yes Historical Provider, MD  traMADol (ULTRAM) 50 MG tablet Take 50 mg by mouth every 6 (six) hours as needed (pain).  07/10/13  Yes Historical Provider, MD    Scheduled Meds: . enoxaparin (LOVENOX) injection  40 mg Subcutaneous Q24H  . insulin aspart  0-9 Units Subcutaneous 6 times per day  . latanoprost  1 drop Both Eyes QHS  . pantoprazole (PROTONIX) IV  40 mg Intravenous Q24H   Infusions: . sodium chloride 75 mL/hr at 08/21/13 0606   PRN Meds: acetaminophen, guaiFENesin, morphine injection, ondansetron (ZOFRAN) IV, pseudoephedrine   Allergies as of 08/19/2013 - Review Complete 08/19/2013  Allergen Reaction Noted  . Penicillins Hives 08/19/2013    family history No  colon cancer, no PUD or GI bleed.   Dad with prostate cancer.   History   Social History  . Marital Status: Married    Spouse Name: N/A    Number of Children: N/A  . Years of Education: N/A   Occupational History  . Not on file.   Social History Main Topics  . Smoking status: Never Smoker   . Smokeless tobacco: Not on file  . Alcohol Use: No  . Drug Use: Not on file  . Sexual Activity: Not on file   Other Topics Concern  . Not on file   Social History Narrative  . No narrative on file    REVIEW OF SYSTEMS: Constitutional:  Weight gain in last few years ENT:  No nose bleeds Pulm:  + cough, productive of yellow sputum in lst 2 weeks CV:  No palpitations, no LE edema.  GU:  No hematuria, no frequency GI:  Per HPI.  No heartburn or  dysphagia Heme:  Per HPI  .  No longer requires po Iron.  Transfusions:  After 2006 hysterectomy Neuro:  No headaches, no peripheral tingling or numbness.  No falls or gait disturbance Derm:  No itching, no rash or sores.  Endocrine:  No sweats or chills.  No polyuria or dysuria Immunization:  Not queried.  Travel:  None beyond local counties in last few months.    PHYSICAL EXAM: Vital signs in last 24 hours: Filed Vitals:   08/21/13 1315  BP: 145/85  Pulse: 64  Temp: 98.6 F (37 C)  Resp: 16   Wt Readings from Last 3 Encounters:  08/19/13 98.431 kg (217 lb)    General: pleasant, uncomfortable but not ill appearing obese AAF Head:  No asymmetry or swelling  Eyes:  No icterus, no pallor.  EOMI Ears:  Not HOH  Nose:  No discharge, no congestion Mouth:  Clear oral MM.  Good dentition.  Neck:  No mass or JVD.  No TMG. Lungs:  Clear without cough or dyspnea Heart: RRR.  No MRG Abdomen:  Obese, soft, tender without guard or rebound in upper quadrants bil.  BS present, not distended. No mass or bruits. No hernias grossly   Rectal: FOB negative, brown stool.    Musc/Skeltl: no joint contracture or deformity Extremities:  No CCE.   Neurologic:  Pleasant, alert, oriented x 3.  No tremor or limb weakness.  Gait balanced and non-faltering.  Skin:  No rash or sores.  No purpura Tattoos:  none Nodes:  No cervical adenopathy   Psych:  Pleasant, relaxed.  Good historian, cooperative.   Intake/Output from previous day: 03/01 0701 - 03/02 0700 In: 2242.5 [P.O.:360; I.V.:1882.5] Out: -  Intake/Output this shift:    LAB RESULTS:  Recent Labs  08/19/13 0657 08/20/13 0550  WBC 8.4 7.5  HGB 14.3 13.0  HCT 40.9 39.7  PLT 341 314   BMET Lab Results  Component Value Date   NA 138 08/21/2013   NA 140 08/20/2013   NA 140 08/19/2013   K 4.0 08/21/2013   K 4.5 08/20/2013   K 4.1 08/19/2013   CL 101 08/21/2013   CL 102 08/20/2013   CL 100 08/19/2013   CO2 26 08/21/2013   CO2 28 08/20/2013    CO2 27 08/19/2013   GLUCOSE 135* 08/21/2013   GLUCOSE 137* 08/20/2013   GLUCOSE 138* 08/19/2013   BUN 8 08/21/2013   BUN 10 08/20/2013   BUN 12 08/19/2013   CREATININE 0.78 08/21/2013  CREATININE 0.79 08/20/2013   CREATININE 0.83 08/19/2013   CALCIUM 9.0 08/21/2013   CALCIUM 9.1 08/20/2013   CALCIUM 9.4 08/19/2013   LFT  Recent Labs  08/19/13 0657 08/20/13 0550 08/21/13 0603  PROT 7.8 6.9 6.8  ALBUMIN 3.7 3.2* 3.2*  AST '11 10 9  ' ALT '13 11 10  ' ALKPHOS 97 86 88  BILITOT 0.3 0.3 0.3   PT/INR No results found for this basename: INR,  PROTIME   Hepatitis Panel No results found for this basename: HEPBSAG, HCVAB, HEPAIGM, HEPBIGM,  in the last 72 hours C-Diff No components found with this basename: cdiff   Lipase     Component Value Date/Time   LIPASE 49 08/20/2013 0550    Drugs of Abuse  No results found for this basename: labopia,  cocainscrnur,  labbenz,  amphetmu,  thcu,  labbarb     RADIOLOGY STUDIES: Dg Abd 1 View 08/20/2013   CLINICAL DATA:  Follow-up contrast progression status post abdominal CT scan, to assess for bowel obstruction. Known ventral abdominal wall hernia.  EXAM: ABDOMEN - 1 VIEW  COMPARISON:  None.  FINDINGS: Contrast has progressed normally into the colon, to the level of the sigmoid colon. There is no evidence for bowel obstruction. The appendix is also filled with contrast.  As noted on recent CT, the patient's periumbilical hernia contains only fat. There is no evidence for bowel herniation. The bowel gas pattern is unremarkable. No free intra-abdominal air is identified, though evaluation for free air is limited on supine views.  No acute osseous abnormalities are seen. There is degenerative change at the right hip, with axial joint space narrowing, sclerosis and subcortical cystic change.  IMPRESSION: 1. Contrast has progressed normally into the colon, to the level of the sigmoid colon. No evidence for bowel obstruction. 2. As noted on recent CT, the patient's  periumbilical hernia contains only fat. There is no evidence for bowel herniation at this time. 3. Degenerative change again noted at the right hip, with axial joint space narrowing, sclerosis and subcortical cystic change.   Electronically Signed   By: Garald Balding M.D.   On: 08/20/2013 00:47   US Abdomen Complete 08/19/2013   FINDINGS: Gallbladder:  No gallstones or wall thickening visualized. No sonographic Murphy sign noted.  Common bile duct:  Diameter: 5.4 mm.  Liver:  Mild fatty infiltration without focal mass.  IVC:  No abnormality visualized.  Pancreas:  Visualized portion unremarkable.  Spleen:  Size and appearance within normal limits.  Right Kidney:  Length: 10.1 cm. Echogenicity within normal limits. No mass or hydronephrosis visualized.  Left Kidney:  Length: 10.0 cm. Echogenicity within normal limits. No mass or hydronephrosis visualized.  Abdominal aorta:  No aneurysm visualized.  Other findings:  None.  IMPRESSION: No acute hepatobiliary findings.  Minimal fatty infiltration of the liver.   Electronically Signed   By: Marin Olp M.D.   On: 08/19/2013 15:52   CT ABDOMEN AND PELVIS WITH CONTRAST 08/19/2013 FINDINGS:  Sagittal images of the spine shows mild degenerative changes  thoracolumbar spine. Lung bases are unremarkable. There are streaky  artifacts from patient's large body habitus. Heart size within  normal limits. Liver, pancreas, spleen and adrenals are  unremarkable. No calcified gallstones are noted within gallbladder.  No aortic aneurysm.  CBD measures 8 mm in diameter.  There is a umbilical hernia containing fat without evidence of acute  complication. Normal appendix. No pericecal inflammation. There is a  midline lower abdominal wall hernia containing omental  fat and small  bowel loop measures 6.5 x 6.5 cm. This is best seen in axial image  72. There is mild distension of the bowel loop within hernia without  definite evidence of obstruction. Minimal ileus at  this level cannot  be excluded. Follow-up examination is recommended to assure passage  of contrast material within colon.  The urinary bladder is unremarkable. The patient is status post  hysterectomy.  No inguinal adenopathy. Kidneys are symmetrical in size and  enhancement. No hydronephrosis or hydroureter.  Delayed renal images shows bilateral renal symmetrical excretion.  Bilateral visualized proximal ureter is unremarkable.  IMPRESSION:  1. There is small umbilical hernia containing fat measures about 3.5  cm without evidence of acute complication.  2. There is a midline lower abdominal wall hernia containing omental  fat and small bowel loop measures 6.5 x 6.5 cm. This is best seen in  axial image 72. There is mild distension of the bowel loop within  hernia without definite evidence of obstruction. Minimal ileus at  this level cannot be excluded. Follow-up examination is recommended  to assure passage of contrast material in colon.  3. No hydronephrosis or hydroureter.  4. Normal appendix. No pericecal inflammation.     ENDOSCOPIC STUDIES: None ever  IMPRESSION:   *  Abdominal pain, progressive over 2 weeks. CT with fat containing, non-obstructive, periumbilical and lower ventral hernias on imaging.   *  2006 TAH/SPO for firbroid uterus, menorrhagia.   * Fatty liver, incidental finding. LFTs normal.  Lipase minimally elevated, now normal.   *  Obesity  *  Type 2 DM.  Oral agents PTA   PLAN:     *  Per Dr Henrene Pastor.  Colonoscopy/ EGD tomorrow.  Split dose moviprep   [name]  08/21/2013, 1:30 PM Pager: [phone]  GI ATTENDING  History, laboratories, x-rays reviewed. Patient personally seen and examined. Agree with H&P as outlined above. Patient presents with bandlike abdominal pain. Workup thus far revealing ventral hernias. Remote hysterectomy as noted. No chronic GI complaints. Surgery wanting additional workup before considering hernia repair. She has never  had colonoscopy. Agree with plans for colonoscopy and upper endoscopy to evaluate pain as well as provide colorectal neoplasia screening.The nature of the procedure, as well as the risks, benefits, and alternatives were carefully and thoroughly reviewed with the patient. Ample time for discussion and questions allowed. The patient understood, was satisfied, and agreed to proceed.  [name]., M.D. [facility] Division of Gastroenterology

## 2013-08-21 NOTE — Progress Notes (Signed)
[facility] PROGRESS NOTE  [name] KKX:[id number] DOB: 24-Apr-1956 DOA: 08/19/2013 PCP: No primary provider on file.  Assessment/Plan: 1. Abdominal pain/Nausea/Vomiting   - I still suspect her ongoing symptoms for the last few weeks are related to intermittent partial obstruction from her Abdominal hernia  - lipase of 67 on admission and now 49 clinically do not suspect Acute pancreatitis any more  - Abd [name] negative for gall stones - [facility] consult requested per surgical recommendations, downgrade diet to clears per GI -need to rule out other causes before ascertaining this is related to her hernia - CCS following  2. DM  -hold metformin, SSI   3. HTN  -stable, continue losartan   4. Cough/URI  -likely worsening abdominal symptoms/hernia etc recently  -continue mucinex   DVT proph: lovenox  Code Status: Full Code Family Communication: none at bedside Disposition Plan: home when stable   Consultants:  [facility]  HPI/Subjective: Still with intermittent abd pain, nausea, ate few bites of full liquids only  Objective: Filed Vitals:   08/21/13 0426  BP: 120/75  Pulse: 60  Temp: 98.2 F (36.8 C)  Resp: 18    Intake/Output Summary (Last 24 hours) at 08/21/13 1018 Last data filed at 08/21/13 0606  Gross per 24 hour  Intake 1882.5 ml  Output      0 ml  Net 1882.5 ml   Filed Weights   08/19/13 0655  Weight: 98.431 kg (217 lb)    Exam:   General:  AAOx3, no distress  Cardiovascular: S1S2/RRR  Respiratory: CTAB  Abdomen: soft, mildly distended, BS present, old healed surgical scars  Musculoskeletal: no edema c/c   Data Reviewed: Basic Metabolic Panel:  Recent Labs Lab 08/19/13 0657 08/20/13 0550 08/21/13 0603  NA 140 140 138  K 4.1 4.5 4.0  CL 100 102 101  CO2 27 28 26   GLUCOSE 138* 137* 135*  BUN 12 10 8   CREATININE 0.83 0.79 0.78  CALCIUM 9.4 9.1 9.0   Liver Function Tests:  Recent Labs Lab 08/19/13 0657 08/20/13 0550  08/21/13 0603  AST 11 10 9   ALT 13 11 10   ALKPHOS 97 86 88  BILITOT 0.3 0.3 0.3  PROT 7.8 6.9 6.8  ALBUMIN 3.7 3.2* 3.2*    Recent Labs Lab 08/19/13 0657 08/20/13 0550  LIPASE 67* 49   No results found for this basename: AMMONIA,  in the last 168 hours CBC:  Recent Labs Lab 08/19/13 0657 08/20/13 0550  WBC 8.4 7.5  NEUTROABS 4.6  --   HGB 14.3 13.0  HCT 40.9 39.7  MCV 85.0 87.1  PLT 341 314   Cardiac Enzymes: No results found for this basename: CKTOTAL, CKMB, CKMBINDEX, TROPONINI,  in the last 168 hours BNP (last 3 results) No results found for this basename: PROBNP,  in the last 8760 hours CBG:  Recent Labs Lab 08/20/13 1717 08/20/13 2013 08/21/13 0038 08/21/13 0424 08/21/13 0751  GLUCAP 116* 112* 126* 152* 153*    No results found for this or any previous visit (from the past 240 hour(s)).   Studies: Dg Abd 1 View  08/20/2013   CLINICAL DATA:  Follow-up contrast progression status post abdominal CT scan, to assess for bowel obstruction. Known ventral abdominal wall hernia.  EXAM: ABDOMEN - 1 VIEW  COMPARISON:  None.  FINDINGS: Contrast has progressed normally into the colon, to the level of the sigmoid colon. There is no evidence for bowel obstruction. The appendix is also filled with contrast.  As  noted on recent CT, the patient's periumbilical hernia contains only fat. There is no evidence for bowel herniation. The bowel gas pattern is unremarkable. No free intra-abdominal air is identified, though evaluation for free air is limited on supine views.  No acute osseous abnormalities are seen. There is degenerative change at the right hip, with axial joint space narrowing, sclerosis and subcortical cystic change.  IMPRESSION: 1. Contrast has progressed normally into the colon, to the level of the sigmoid colon. No evidence for bowel obstruction. 2. As noted on recent CT, the patient's periumbilical hernia contains only fat. There is no evidence for bowel herniation  at this time. 3. Degenerative change again noted at the right hip, with axial joint space narrowing, sclerosis and subcortical cystic change.   Electronically Signed   By: Garald Balding M.D.   On: 08/20/2013 00:47   US Abdomen Complete  08/19/2013   CLINICAL DATA:  Abdominal pain.  EXAM: ULTRASOUND ABDOMEN COMPLETE  COMPARISON:  CT 08/19/2013  FINDINGS: Gallbladder:  No gallstones or wall thickening visualized. No sonographic Murphy sign noted.  Common bile duct:  Diameter: 5.4 mm.  Liver:  Mild fatty infiltration without focal mass.  IVC:  No abnormality visualized.  Pancreas:  Visualized portion unremarkable.  Spleen:  Size and appearance within normal limits.  Right Kidney:  Length: 10.1 cm. Echogenicity within normal limits. No mass or hydronephrosis visualized.  Left Kidney:  Length: 10.0 cm. Echogenicity within normal limits. No mass or hydronephrosis visualized.  Abdominal aorta:  No aneurysm visualized.  Other findings:  None.  IMPRESSION: No acute hepatobiliary findings.  Minimal fatty infiltration of the liver.   Electronically Signed   By: Marin Olp M.D.   On: 08/19/2013 15:52    Scheduled Meds: . enoxaparin (LOVENOX) injection  40 mg Subcutaneous Q24H  . insulin aspart  0-9 Units Subcutaneous 6 times per day  . latanoprost  1 drop Both Eyes QHS  . pantoprazole (PROTONIX) IV  40 mg Intravenous Q24H   Continuous Infusions: . sodium chloride 75 mL/hr at 08/21/13 0606   Antibiotics Given (last 72 hours)   None      Active Problems:   Abdominal pain   Diabetes mellitus   Essential hypertension, benign   Ventral hernia    Time spent: 68min    Edie Vallandingham  [facility] Pager [phone]. If 7PM-7AM, please contact night-coverage at [email or web address], password [facility] 08/21/2013, 10:18 AM  LOS: 2 days

## 2013-08-22 ENCOUNTER — Encounter (HOSPITAL_COMMUNITY): Payer: Self-pay | Admitting: Gastroenterology

## 2013-08-22 ENCOUNTER — Encounter (HOSPITAL_COMMUNITY): Admission: EM | Disposition: A | Discharge status: Home / Self Care | Payer: Self-pay | Source: Home / Self Care

## 2013-08-22 DIAGNOSIS — K439 Ventral hernia without obstruction or gangrene: Secondary | ICD-10-CM

## 2013-08-22 DIAGNOSIS — I10 Essential (primary) hypertension: Secondary | ICD-10-CM

## 2013-08-22 DIAGNOSIS — D126 Benign neoplasm of colon, unspecified: Secondary | ICD-10-CM

## 2013-08-22 DIAGNOSIS — E119 Type 2 diabetes mellitus without complications: Secondary | ICD-10-CM

## 2013-08-22 DIAGNOSIS — Z1211 Encounter for screening for malignant neoplasm of colon: Secondary | ICD-10-CM

## 2013-08-22 DIAGNOSIS — R109 Unspecified abdominal pain: Secondary | ICD-10-CM

## 2013-08-22 HISTORY — PX: ESOPHAGOGASTRODUODENOSCOPY: SHX5428

## 2013-08-22 HISTORY — PX: COLONOSCOPY: SHX5424

## 2013-08-22 LAB — GLUCOSE, CAPILLARY
Glucose-Capillary: 102 mg/dL — ABNORMAL HIGH (ref 70–99)
Glucose-Capillary: 114 mg/dL — ABNORMAL HIGH (ref 70–99)
Glucose-Capillary: 116 mg/dL — ABNORMAL HIGH (ref 70–99)
Glucose-Capillary: 129 mg/dL — ABNORMAL HIGH (ref 70–99)
Glucose-Capillary: 130 mg/dL — ABNORMAL HIGH (ref 70–99)
Glucose-Capillary: 148 mg/dL — ABNORMAL HIGH (ref 70–99)
Glucose-Capillary: 159 mg/dL — ABNORMAL HIGH (ref 70–99)

## 2013-08-22 SURGERY — EGD (ESOPHAGOGASTRODUODENOSCOPY)
Anesthesia: Moderate Sedation

## 2013-08-22 MED ORDER — BUTAMBEN-TETRACAINE-BENZOCAINE 2-2-14 % EX AERO
INHALATION_SPRAY | CUTANEOUS | Status: DC | PRN
  Administered 2013-08-22: 1 via TOPICAL

## 2013-08-22 MED ORDER — MIDAZOLAM HCL 5 MG/ML IJ SOLN
INTRAMUSCULAR | Status: AC
  Filled 2013-08-22: qty 2

## 2013-08-22 MED ORDER — SODIUM CHLORIDE 0.9 % IV SOLN: INTRAVENOUS | Status: DC

## 2013-08-22 MED ORDER — FENTANYL CITRATE 0.05 MG/ML IJ SOLN
INTRAMUSCULAR | Status: AC
  Filled 2013-08-22: qty 2

## 2013-08-22 MED ORDER — MIDAZOLAM HCL 10 MG/2ML IJ SOLN
INTRAMUSCULAR | Status: DC | PRN
  Administered 2013-08-22: 1 mg via INTRAVENOUS
  Administered 2013-08-22 (×3): 2 mg via INTRAVENOUS

## 2013-08-22 MED ORDER — FENTANYL CITRATE 0.05 MG/ML IJ SOLN
INTRAMUSCULAR | Status: DC | PRN
  Administered 2013-08-22 (×4): 25 ug via INTRAVENOUS

## 2013-08-22 NOTE — H&P (View-Only) (Signed)
[facility] Consult: 1:30 PM 08/21/2013  LOS: 2 days    Referring Provider: Domenic Polite MD Primary Care Physician:  Arlyce Dice MD Primary Gastroenterologist:  Dr.  Althia Forts.     Reason for Consultation:  Nausea and abdominal pain.    HPI: BIANKA LIBERATI is a 58 y.o. female.  PMH of DM, HTN, obesity, H/o Total Abdominal hysterectomy/SPO in 2006  2 weeks of progressive abdominal pain, initially in mid abdomen, more recently radiating in band pattern around into mid to low back.  Nausea without emesis (vomitted once after pain med yesterday).  Admitted 08/19/13.  + sweats but normal WBC count and no fever.  No pain increase after food/drink.  Appetite moderately reduced.  No recent weight loss.  No hx of heartburn or of NSAID use.  Never had EGD of colonoscopy.  Initial lipase was 67, now 49.  LFTs consistently normal.  CT scan and ultrasound show hernias at periumbilical and lower ventral abdominal wall.  These are not causing obstruction but there was distended loop of bowel vs focal ileus on the CT scan.   Surgery not yet committed to laparoscopy/laparotomy.  Not sure the hernias are causing the sxs.  Would like GI endoscopic evaluation before making decision about surgery.     Past Medical History  Diagnosis Date  . Hypertension   . Diabetes mellitus without complication   . Anemia 2006    required transfusion post TAH/BSO 05/2005    Past Surgical History  Procedure Laterality Date  . Total abdominal hysterectomy w/ bilateral salpingoophorectomy  05/2005  . Hysteroscopy w/d&c  01/2005    for uterine fibroids.     Prior to Admission medications   Medication Sig Start Date End Date Taking? Authorizing Provider  bimatoprost (LUMIGAN) 0.03 % ophthalmic solution Place 1 drop into both eyes at  bedtime.   Yes Historical Provider, MD  glimepiride (AMARYL) 4 MG tablet Take 4 mg by mouth daily with breakfast.   Yes Historical Provider, MD  losartan (COZAAR) 25 MG tablet Take 25 mg by mouth daily.   Yes Historical Provider, MD  metFORMIN (GLUCOPHAGE) 500 MG tablet Take 500 mg by mouth 2 (two) times daily with a meal.   Yes Historical Provider, MD  pseudoephedrine-guaifenesin (MUCINEX D) 60-600 MG per tablet Take 1 tablet by mouth every 12 (twelve) hours.   Yes Historical Provider, MD  traMADol (ULTRAM) 50 MG tablet Take 50 mg by mouth every 6 (six) hours as needed (pain).  07/10/13  Yes Historical Provider, MD    Scheduled Meds: . enoxaparin (LOVENOX) injection  40 mg Subcutaneous Q24H  . insulin aspart  0-9 Units Subcutaneous 6 times per day  . latanoprost  1 drop Both Eyes QHS  . pantoprazole (PROTONIX) IV  40 mg Intravenous Q24H   Infusions: . sodium chloride 75 mL/hr at 08/21/13 0606   PRN Meds: acetaminophen, guaiFENesin, morphine injection, ondansetron (ZOFRAN) IV, pseudoephedrine   Allergies as of 08/19/2013 - Review Complete 08/19/2013  Allergen Reaction Noted  . Penicillins Hives 08/19/2013    family history No  colon cancer, no PUD or GI bleed.   Dad with prostate cancer.   History   Social History  . Marital Status: Married    Spouse Name: N/A    Number of Children: N/A  . Years of Education: N/A   Occupational History  . Not on file.   Social History Main Topics  . Smoking status: Never Smoker   . Smokeless tobacco: Not on file  . Alcohol Use: No  . Drug Use: Not on file  . Sexual Activity: Not on file   Other Topics Concern  . Not on file   Social History Narrative  . No narrative on file    REVIEW OF SYSTEMS: Constitutional:  Weight gain in last few years ENT:  No nose bleeds Pulm:  + cough, productive of yellow sputum in lst 2 weeks CV:  No palpitations, no LE edema.  GU:  No hematuria, no frequency GI:  Per HPI.  No heartburn or  dysphagia Heme:  Per HPI  .  No longer requires po Iron.  Transfusions:  After 2006 hysterectomy Neuro:  No headaches, no peripheral tingling or numbness.  No falls or gait disturbance Derm:  No itching, no rash or sores.  Endocrine:  No sweats or chills.  No polyuria or dysuria Immunization:  Not queried.  Travel:  None beyond local counties in last few months.    PHYSICAL EXAM: Vital signs in last 24 hours: Filed Vitals:   08/21/13 1315  BP: 145/85  Pulse: 64  Temp: 98.6 F (37 C)  Resp: 16   Wt Readings from Last 3 Encounters:  08/19/13 98.431 kg (217 lb)    General: pleasant, uncomfortable but not ill appearing obese AAF Head:  No asymmetry or swelling  Eyes:  No icterus, no pallor.  EOMI Ears:  Not HOH  Nose:  No discharge, no congestion Mouth:  Clear oral MM.  Good dentition.  Neck:  No mass or JVD.  No TMG. Lungs:  Clear without cough or dyspnea Heart: RRR.  No MRG Abdomen:  Obese, soft, tender without guard or rebound in upper quadrants bil.  BS present, not distended. No mass or bruits. No hernias grossly   Rectal: FOB negative, brown stool.    Musc/Skeltl: no joint contracture or deformity Extremities:  No CCE.   Neurologic:  Pleasant, alert, oriented x 3.  No tremor or limb weakness.  Gait balanced and non-faltering.  Skin:  No rash or sores.  No purpura Tattoos:  none Nodes:  No cervical adenopathy   Psych:  Pleasant, relaxed.  Good historian, cooperative.   Intake/Output from previous day: 03/01 0701 - 03/02 0700 In: 2242.5 [P.O.:360; I.V.:1882.5] Out: -  Intake/Output this shift:    LAB RESULTS:  Recent Labs  08/19/13 0657 08/20/13 0550  WBC 8.4 7.5  HGB 14.3 13.0  HCT 40.9 39.7  PLT 341 314   BMET Lab Results  Component Value Date   NA 138 08/21/2013   NA 140 08/20/2013   NA 140 08/19/2013   K 4.0 08/21/2013   K 4.5 08/20/2013   K 4.1 08/19/2013   CL 101 08/21/2013   CL 102 08/20/2013   CL 100 08/19/2013   CO2 26 08/21/2013   CO2 28 08/20/2013    CO2 27 08/19/2013   GLUCOSE 135* 08/21/2013   GLUCOSE 137* 08/20/2013   GLUCOSE 138* 08/19/2013   BUN 8 08/21/2013   BUN 10 08/20/2013   BUN 12 08/19/2013   CREATININE 0.78 08/21/2013  CREATININE 0.79 08/20/2013   CREATININE 0.83 08/19/2013   CALCIUM 9.0 08/21/2013   CALCIUM 9.1 08/20/2013   CALCIUM 9.4 08/19/2013   LFT  Recent Labs  08/19/13 0657 08/20/13 0550 08/21/13 0603  PROT 7.8 6.9 6.8  ALBUMIN 3.7 3.2* 3.2*  AST '11 10 9  ' ALT '13 11 10  ' ALKPHOS 97 86 88  BILITOT 0.3 0.3 0.3   PT/INR No results found for this basename: INR,  PROTIME   Hepatitis Panel No results found for this basename: HEPBSAG, HCVAB, HEPAIGM, HEPBIGM,  in the last 72 hours C-Diff No components found with this basename: cdiff   Lipase     Component Value Date/Time   LIPASE 49 08/20/2013 0550    Drugs of Abuse  No results found for this basename: labopia,  cocainscrnur,  labbenz,  amphetmu,  thcu,  labbarb     RADIOLOGY STUDIES: Dg Abd 1 View 08/20/2013   CLINICAL DATA:  Follow-up contrast progression status post abdominal CT scan, to assess for bowel obstruction. Known ventral abdominal wall hernia.  EXAM: ABDOMEN - 1 VIEW  COMPARISON:  None.  FINDINGS: Contrast has progressed normally into the colon, to the level of the sigmoid colon. There is no evidence for bowel obstruction. The appendix is also filled with contrast.  As noted on recent CT, the patient's periumbilical hernia contains only fat. There is no evidence for bowel herniation. The bowel gas pattern is unremarkable. No free intra-abdominal air is identified, though evaluation for free air is limited on supine views.  No acute osseous abnormalities are seen. There is degenerative change at the right hip, with axial joint space narrowing, sclerosis and subcortical cystic change.  IMPRESSION: 1. Contrast has progressed normally into the colon, to the level of the sigmoid colon. No evidence for bowel obstruction. 2. As noted on recent CT, the patient's  periumbilical hernia contains only fat. There is no evidence for bowel herniation at this time. 3. Degenerative change again noted at the right hip, with axial joint space narrowing, sclerosis and subcortical cystic change.   Electronically Signed   By: Garald Balding M.D.   On: 08/20/2013 00:47   US Abdomen Complete 08/19/2013   FINDINGS: Gallbladder:  No gallstones or wall thickening visualized. No sonographic Murphy sign noted.  Common bile duct:  Diameter: 5.4 mm.  Liver:  Mild fatty infiltration without focal mass.  IVC:  No abnormality visualized.  Pancreas:  Visualized portion unremarkable.  Spleen:  Size and appearance within normal limits.  Right Kidney:  Length: 10.1 cm. Echogenicity within normal limits. No mass or hydronephrosis visualized.  Left Kidney:  Length: 10.0 cm. Echogenicity within normal limits. No mass or hydronephrosis visualized.  Abdominal aorta:  No aneurysm visualized.  Other findings:  None.  IMPRESSION: No acute hepatobiliary findings.  Minimal fatty infiltration of the liver.   Electronically Signed   By: Marin Olp M.D.   On: 08/19/2013 15:52   CT ABDOMEN AND PELVIS WITH CONTRAST 08/19/2013 FINDINGS:  Sagittal images of the spine shows mild degenerative changes  thoracolumbar spine. Lung bases are unremarkable. There are streaky  artifacts from patient's large body habitus. Heart size within  normal limits. Liver, pancreas, spleen and adrenals are  unremarkable. No calcified gallstones are noted within gallbladder.  No aortic aneurysm.  CBD measures 8 mm in diameter.  There is a umbilical hernia containing fat without evidence of acute  complication. Normal appendix. No pericecal inflammation. There is a  midline lower abdominal wall hernia containing omental  fat and small  bowel loop measures 6.5 x 6.5 cm. This is best seen in axial image  72. There is mild distension of the bowel loop within hernia without  definite evidence of obstruction. Minimal ileus at  this level cannot  be excluded. Follow-up examination is recommended to assure passage  of contrast material within colon.  The urinary bladder is unremarkable. The patient is status post  hysterectomy.  No inguinal adenopathy. Kidneys are symmetrical in size and  enhancement. No hydronephrosis or hydroureter.  Delayed renal images shows bilateral renal symmetrical excretion.  Bilateral visualized proximal ureter is unremarkable.  IMPRESSION:  1. There is small umbilical hernia containing fat measures about 3.5  cm without evidence of acute complication.  2. There is a midline lower abdominal wall hernia containing omental  fat and small bowel loop measures 6.5 x 6.5 cm. This is best seen in  axial image 72. There is mild distension of the bowel loop within  hernia without definite evidence of obstruction. Minimal ileus at  this level cannot be excluded. Follow-up examination is recommended  to assure passage of contrast material in colon.  3. No hydronephrosis or hydroureter.  4. Normal appendix. No pericecal inflammation.     ENDOSCOPIC STUDIES: None ever  IMPRESSION:   *  Abdominal pain, progressive over 2 weeks. CT with fat containing, non-obstructive, periumbilical and lower ventral hernias on imaging.   *  2006 TAH/SPO for firbroid uterus, menorrhagia.   * Fatty liver, incidental finding. LFTs normal.  Lipase minimally elevated, now normal.   *  Obesity  *  Type 2 DM.  Oral agents PTA   PLAN:     *  Per Dr Henrene Pastor.  Colonoscopy/ EGD tomorrow.  Split dose moviprep   Azucena Freed  08/21/2013, 1:30 PM Pager: [phone]  GI ATTENDING  History, laboratories, x-rays reviewed. Patient personally seen and examined. Agree with H&P as outlined above. Patient presents with bandlike abdominal pain. Workup thus far revealing ventral hernias. Remote hysterectomy as noted. No chronic GI complaints. Surgery wanting additional workup before considering hernia repair. She has never  had colonoscopy. Agree with plans for colonoscopy and upper endoscopy to evaluate pain as well as provide colorectal neoplasia screening.The nature of the procedure, as well as the risks, benefits, and alternatives were carefully and thoroughly reviewed with the patient. Ample time for discussion and questions allowed. The patient understood, was satisfied, and agreed to proceed.  Docia Chuck. Geri Seminole., M.D. [facility] Division of Gastroenterology

## 2013-08-22 NOTE — Interval H&P Note (Signed)
History and Physical Interval Note:  08/22/2013 1:49 PM  [name]  has presented today for surgery, with the diagnosis of abdominal pain,  The various methods of treatment have been discussed with the patient and family. After consideration of risks, benefits and other options for treatment, the patient has consented to  Procedure(s): ESOPHAGOGASTRODUODENOSCOPY (EGD) (N/A) COLONOSCOPY (N/A) as a surgical intervention .  The patient's history has been reviewed, patient examined, no change in status, stable for surgery.  I have reviewed the patient's chart and labs.  Questions were answered to the patient's satisfaction.     Scarlette Shorts

## 2013-08-22 NOTE — Op Note (Signed)
[facility] [place], [id number]   COLONOSCOPY PROCEDURE REPORT  PATIENT: [name], [name]  MR#: [id number] BIRTHDATE: 01-06-1956 , 57  yrs. old GENDER: Female ENDOSCOPIST: Eustace Quail, MD REFERRED AO:[facility], Excell Seltzer, M.D. PROCEDURE DATE:  08/22/2013 PROCEDURE:   Colonoscopy with snare polypectomy x 1 First Screening Colonoscopy - Avg.  risk and is 50 yrs.  old or older Yes.  Prior Negative Screening - Now for repeat screening. N/A  History of Adenoma - Now for follow-up colonoscopy & has been > or = to 3 yrs.  N/A  Polyps Removed Today? Yes. ASA CLASS:   Class II INDICATIONS:average risk screening and Abdominal pain. MEDICATIONS: Fentanyl 75 mcg IV and Versed 4 mg IV DESCRIPTION OF PROCEDURE:   After the risks benefits and alternatives of the procedure were thoroughly explained, informed consent was obtained.  A digital rectal exam revealed no abnormalities of the rectum.   The EC-[id number] ([id number])  endoscope was introduced through the anus and advanced to the cecum, which was identified by both the appendix and ileocecal valve. No adverse events experienced.   The quality of the prep was good, using MoviPrep  The instrument was then slowly withdrawn as the colon was fully examined.  COLON FINDINGS: The mucosa appeared normal in the terminal ileum. A diminutive polyp was found in the transverse colon.  A polypectomy was performed with a cold snare.  The resection was complete and the polyp tissue was completely retrieved.   Moderate diverticulosis was noted throughout the entire examined colon. The colon mucosa was otherwise normal.  Retroflexed views revealed no abnormalities. The time to cecum=2 minutes 0 seconds. Withdrawal time=11 minutes 0 seconds.  The scope was withdrawn and the procedure completed. COMPLICATIONS: There were no complications.  ENDOSCOPIC IMPRESSION: 1.   Normal mucosa in the terminal  ileum 2.   Diminutive polyp was found in the transverse colon; polypectomy was performed with a cold snare 3.   Moderate diverticulosis was noted throughout the entire examined colon 4.   The colon mucosa was otherwise normal  RECOMMENDATIONS: 1.  Repeat colonoscopy in 5 years if polyp adenomatous; otherwise 10 years 2.  Upper endoscopy today (SEE REPORT)   eSigned:  Eustace Quail, MD 08/22/2013 2:22 PM   cc: Excell Seltzer, MD and The Patient   PATIENT NAME:  [name], [name] MR#: [id number]

## 2013-08-22 NOTE — Progress Notes (Addendum)
[facility] PROGRESS NOTE  [name] [id number]:[id number] DOB: [birth year] DOA: 08/19/2013 PCP: No primary provider on file. Brief Narrative: [name] is a 58 y.o. female with PMH of DM, HTN, obesity, H/o Total Abdominal hysterectomy in 2007 presents to the ER from home today with abdominal pain.She reported abdominal pain, distention, nausea for the last 2 weeks, progressively worsened over the last 4 days.  In ER, labs unremarkably, lipase 67, CT abd with abd wall hernia with small bowel loop without definitive obstruction.  Seen by [facility] who recommended admission per [facility] for possible pancreatitis. Now Surgery wanted GI eval to r/o other causes of pain before making decision about hernia and hence plan for EGD/Colonoscopy today.  Assessment/Plan: 1. Abdominal pain/Nausea/Vomiting   - I still suspect her ongoing symptoms for the last few weeks are related to intermittent partial obstruction from her Abdominal hernia  - lipase of 67 on admission and now 49 clinically do not suspect Acute pancreatitis any more  - Abd [name] negative for gall stones - [place] [facility] consulted per surgical recommendations, and for EGD/COlonoscopy today to rule out other causes before ascertaining this is related to her hernia - [facility] following  2. DM  -hold metformin, SSI   3. HTN  -stable, continue losartan   4. Cough/URI  -likely worsening abdominal symptoms/hernia etc recently  -continue mucinex   DVT proph: lovenox  Code Status: Full Code Family Communication: none at bedside Disposition Plan: home when stable   Consultants:  CCS  HPI/Subjective: Still with intermittent abd pain, nausea, vomited last pm  Objective: Filed Vitals:   08/22/13 1510  BP: 138/102  Pulse:   Temp:   Resp: 18    Intake/Output Summary (Last 24 hours) at 08/22/13 1523 Last data filed at 08/22/13 0957  Gross per 24 hour  Intake 1096.25 ml  Output      0 ml  Net 1096.25 ml   Filed Weights   08/19/13 0655   Weight: 98.431 kg (217 lb)    Exam:   General:  AAOx3, no distress  Cardiovascular: S1S2/RRR  Respiratory: CTAB  Abdomen: soft, less distended, BS present, old healed surgical scars  Musculoskeletal: no edema c/c   Data Reviewed: Basic Metabolic Panel:  Recent Labs Lab 08/19/13 0657 08/20/13 0550 08/21/13 0603  NA 140 140 138  K 4.1 4.5 4.0  CL 100 102 101  CO2 27 28 26   GLUCOSE 138* 137* 135*  BUN 12 10 8   CREATININE 0.83 0.79 0.78  CALCIUM 9.4 9.1 9.0   Liver Function Tests:  Recent Labs Lab 08/19/13 0657 08/20/13 0550 08/21/13 0603  AST 11 10 9   ALT 13 11 10   ALKPHOS 97 86 88  BILITOT 0.3 0.3 0.3  PROT 7.8 6.9 6.8  ALBUMIN 3.7 3.2* 3.2*    Recent Labs Lab 08/19/13 0657 08/20/13 0550  LIPASE 67* 49   No results found for this basename: AMMONIA,  in the last 168 hours CBC:  Recent Labs Lab 08/19/13 0657 08/20/13 0550  WBC 8.4 7.5  NEUTROABS 4.6  --   HGB 14.3 13.0  HCT 40.9 39.7  MCV 85.0 87.1  PLT 341 314   Cardiac Enzymes: No results found for this basename: CKTOTAL, CKMB, CKMBINDEX, TROPONINI,  in the last 168 hours BNP (last 3 results) No results found for this basename: PROBNP,  in the last 8760 hours CBG:  Recent Labs Lab 08/21/13 2105 08/22/13 0025 08/22/13 0404 08/22/13 0749 08/22/13 1143  GLUCAP 145* 130* 114* 148*  129*    No results found for this or any previous visit (from the past 240 hour(s)).   Studies: No results found.  Scheduled Meds: . [MAR HOLD] enoxaparin (LOVENOX) injection  40 mg Subcutaneous Q24H  . [MAR HOLD] insulin aspart  0-9 Units Subcutaneous 6 times per day  . [MAR HOLD] latanoprost  1 drop Both Eyes QHS  . [facility] HOLD] pantoprazole  40 mg Oral Q0600   Continuous Infusions: . sodium chloride 75 mL/hr at 08/22/13 0721  . sodium chloride 20 mL/hr (08/22/13 0745)   Antibiotics Given (last 72 hours)   None      Active Problems:   Abdominal pain   Diabetes mellitus   Essential  hypertension, benign   Ventral hernia   Benign neoplasm of colon   Special screening for malignant neoplasms, colon    Time spent: 34min    Macio Kissoon  [facility] Pager [phone]. If 7PM-7AM, please contact night-coverage at [email or web address], password [facility] 08/22/2013, 3:23 PM  LOS: 3 days

## 2013-08-22 NOTE — Op Note (Signed)
[facility] [place], [id number]   ENDOSCOPY PROCEDURE REPORT  PATIENT: [name], Renee  MR#: [id number] BIRTHDATE: 1956-06-15 , 57  yrs. old GENDER: Female ENDOSCOPIST: Eustace Quail, MD REFERRED BY:  Excell Seltzer, M.D. PROCEDURE DATE:  08/22/2013 PROCEDURE:  EGD, diagnostic ASA CLASS:     Class II INDICATIONS:  abdominal pain. MEDICATIONS: Fentanyl 25 mcg IV and Versed 3 mg IV TOPICAL ANESTHETIC: Cetacaine Spray  DESCRIPTION OF PROCEDURE: After the risks benefits and alternatives of the procedure were thoroughly explained, informed consent was obtained.  The Pentax Gastroscope [id number] endoscope was introduced through the mouth and advanced to the second portion of the duodenum. Without limitations.  The instrument was slowly withdrawn as the mucosa was fully examined.      EXAM: The upper, middle and distal third of the esophagus were carefully inspected and no abnormalities were noted.  The z-line was well seen at the GEJ.  The endoscope was pushed into the fundus which was normal including a retroflexed view.  The antrum, gastric body, first and second part of the duodenum were unremarkable. Retroflexed views revealed no abnormalities.     The scope was then withdrawn from the patient and the procedure completed.  COMPLICATIONS: There were no complications. ENDOSCOPIC IMPRESSION: 1. Normal EGD 2. No GI cause for pain found despite extensive workup  RECOMMENDATIONS: 1. Surgery to decide on possible hernia repair... GI available as needed  REPEAT EXAM:  eSigned:  Eustace Quail, MD 08/22/2013 2:39 PM   TK:[id number] Jeanie Cooks, MD, Excell Seltzer, MD, and The Patient

## 2013-08-23 ENCOUNTER — Encounter: Payer: Self-pay | Admitting: Internal Medicine

## 2013-08-23 ENCOUNTER — Encounter (HOSPITAL_COMMUNITY): Payer: Self-pay | Admitting: Internal Medicine

## 2013-08-23 LAB — GLUCOSE, CAPILLARY
Glucose-Capillary: 115 mg/dL — ABNORMAL HIGH (ref 70–99)
Glucose-Capillary: 134 mg/dL — ABNORMAL HIGH (ref 70–99)
Glucose-Capillary: 183 mg/dL — ABNORMAL HIGH (ref 70–99)
Glucose-Capillary: 121 mg/dL — ABNORMAL HIGH (ref 70–99)
Glucose-Capillary: 162 mg/dL — ABNORMAL HIGH (ref 70–99)

## 2013-08-23 NOTE — Progress Notes (Signed)
[facility] PROGRESS NOTE  [name] MWN:[id number] DOB: 1956/01/24 DOA: 08/19/2013 PCP: No primary provider on file. Brief Narrative: [name] is a 58 y.o. female with PMH of DM, HTN, obesity, H/o Total Abdominal hysterectomy in 2007 presents to the ER from home today with abdominal pain.She reported abdominal pain, distention, nausea for the last 2 weeks, progressively worsened over the last 4 days.  In ER, labs unremarkably, lipase 67, CT abd with abd wall hernia with small bowel loop without definitive obstruction.  Seen by [facility] who recommended admission per [facility] for possible pancreatitis. Surgery wanted GI eval to r/o other causes of pain before making decision about hernia. GI performed EGD and colonoscopy and no GI cause for pain found despite extensive workup-GI signed off.  Assessment/Plan: 1. Abdominal pain/Nausea/Vomiting   - Unclear etiology. Lipase of 67 on admission and now 49 not suggestive of acute pancreatitis  - Abd [name] negative for gall stones - GI evaluated and extensive workup performed including EGD and colonoscopy which did not reveal etiology. Await surgical input regarding further management/possible hernia repair  2. DM  -hold metformin, SSI. Reasonable inpatient control.   3. HTN  -stable, continue losartan   4. Cough/URI  -likely worsening abdominal symptoms/hernia etc recently  -continue mucinex   DVT proph: lovenox  Code Status: Full Code Family Communication: none at bedside Disposition Plan: home when stable   Consultants:  CCS  HPI/Subjective: Continues to complain of significant intermittent mid abdominal pain and nausea but no vomiting. Passing flatus. No BM today.   Objective: Filed Vitals:   08/23/13 0514  BP: 141/89  Pulse: 57  Temp: 98.1 F (36.7 C)  Resp: 18    Intake/Output Summary (Last 24 hours) at 08/23/13 0743 Last data filed at 08/22/13 1947  Gross per 24 hour  Intake   1970 ml  Output      0 ml  Net    1970 ml   Filed Weights   08/19/13 0655  Weight: 98.431 kg (217 lb)    Exam:   General:  AAOx3, no distress  Cardiovascular: S1S2/RRR  Respiratory: CTAB  Abdomen: soft, less distended, BS present, old healed surgical scars. Mild mid abdominal tenderness without peritoneal signs.  Musculoskeletal: no edema c/c   Data Reviewed: Basic Metabolic Panel:  Recent Labs Lab 08/19/13 0657 08/20/13 0550 08/21/13 0603  NA 140 140 138  K 4.1 4.5 4.0  CL 100 102 101  CO2 27 28 26   GLUCOSE 138* 137* 135*  BUN 12 10 8   CREATININE 0.83 0.79 0.78  CALCIUM 9.4 9.1 9.0   Liver Function Tests:  Recent Labs Lab 08/19/13 0657 08/20/13 0550 08/21/13 0603  AST 11 10 9   ALT 13 11 10   ALKPHOS 97 86 88  BILITOT 0.3 0.3 0.3  PROT 7.8 6.9 6.8  ALBUMIN 3.7 3.2* 3.2*    Recent Labs Lab 08/19/13 0657 08/20/13 0550  LIPASE 67* 49   No results found for this basename: AMMONIA,  in the last 168 hours CBC:  Recent Labs Lab 08/19/13 0657 08/20/13 0550  WBC 8.4 7.5  NEUTROABS 4.6  --   HGB 14.3 13.0  HCT 40.9 39.7  MCV 85.0 87.1  PLT 341 314   Cardiac Enzymes: No results found for this basename: CKTOTAL, CKMB, CKMBINDEX, TROPONINI,  in the last 168 hours BNP (last 3 results) No results found for this basename: PROBNP,  in the last 8760 hours CBG:  Recent Labs Lab 08/22/13 1143 08/22/13 1623 08/22/13  2010 08/22/13 2353 08/23/13 0403  GLUCAP 129* 102* 159* 116* 115*    No results found for this or any previous visit (from the past 240 hour(s)).   Studies: No results found.  Scheduled Meds: . enoxaparin (LOVENOX) injection  40 mg Subcutaneous Q24H  . insulin aspart  0-9 Units Subcutaneous 6 times per day  . latanoprost  1 drop Both Eyes QHS  . pantoprazole  40 mg Oral Q0600   Continuous Infusions: . sodium chloride 75 mL/hr (08/22/13 1904)   Antibiotics Given (last 72 hours)   None      Active Problems:   Abdominal pain   Diabetes mellitus    Essential hypertension, benign   Ventral hernia   Benign neoplasm of colon   Special screening for malignant neoplasms, colon    Time spent: 27min    Akul Leggette  [facility] Pager [phone]. If 7PM-7AM, please contact night-coverage at [email or web address], password [facility] 08/23/2013, 7:43 AM  LOS: 4 days

## 2013-08-23 NOTE — Progress Notes (Signed)
Patient interviewed and examined, agree with PA note above.  I had a long discussion with the patient today. She has had an extensive workup that shows no source of her abdominal pain other than her ventral incisional hernia. Examination again today reveals a lower abdominal incisional hernia that is reducible and somewhat tender. With pressure she feels like this is the area of her pain. She has a large pannus. I discussed options with the patient including proceeding with hernia repair this hospitalization versus trying to do this electively or even trying to go for weight loss program preoperatively to try to improve the success of surgery. She feels she is having too much pain to delay the surgery. Therefore will plan to go ahead tomorrow with repair of her ventral incisional hernia. She has a very large pannus and I recommended panniculectomy with a transverse incision for access. We discussed the nature of surgery and use of mesh. I discussed risks of anesthetic complications, bleeding, infection, intestinal injury and recurrence. She understands all her questions were answered and she desires to proceed.  Edward Jolly MD, FACS  08/23/2013 7:59 PM

## 2013-08-23 NOTE — Progress Notes (Signed)
1 Day Post-Op  Subjective: She has ongoing discomfort, she says it's worse with cough.  She was able to eat but had some nausea.  Nothing seems to make her better or worse.  Objective: Vital signs in last 24 hours: Temp:  [98.1 F (36.7 C)-98.8 F (37.1 C)] 98.1 F (36.7 C) (03/04 0514) Pulse Rate:  [57-72] 57 (03/04 0514) Resp:  [9-22] 18 (03/04 0514) BP: (125-176)/(49-116) 141/89 mmHg (03/04 0514) SpO2:  [96 %-100 %] 98 % (03/04 0514) Last BM Date: 08/22/13 120 PO yesterday, 240 PO today recorded. No BM, but had a colonoscopy yesterday. Regular diet Afebrile, VSS, BP up some No labs since 3/2 Labs have been unremarkable since admit. Intake/Output from previous day: 03/03 0701 - 03/04 0700 In: 1970 [P.O.:120; I.V.:1850] Out: -  Intake/Output this shift: Total I/O In: 1231.3 [P.O.:240; I.V.:991.3] Out: -   General appearance: alert, cooperative and no distress GI: soft, not distended, still says she's hurts all the time, more with coughing.  she got up and into bed with difficulty.  Lab Results:  No results found for this basename: WBC, HGB, HCT, PLT,  in the last 72 hours  BMET  Recent Labs  08/21/13 0603  NA 138  K 4.0  CL 101  CO2 26  GLUCOSE 135*  BUN 8  CREATININE 0.78  CALCIUM 9.0   PT/INR No results found for this basename: LABPROT, INR,  in the last 72 hours   Recent Labs Lab 08/19/13 0657 08/20/13 0550 08/21/13 0603  AST 11 10 9   ALT 13 11 10   ALKPHOS 97 86 88  BILITOT 0.3 0.3 0.3  PROT 7.8 6.9 6.8  ALBUMIN 3.7 3.2* 3.2*     Lipase     Component Value Date/Time   LIPASE 49 08/20/2013 0550     Studies/Results: No results found.  Medications: . enoxaparin (LOVENOX) injection  40 mg Subcutaneous Q24H  . insulin aspart  0-9 Units Subcutaneous 6 times per day  . latanoprost  1 drop Both Eyes QHS  . pantoprazole  40 mg Oral Q0600    Assessment/Plan 1.   Lower abdominal pain of uncertain etiology. 2.  CT shows small umbilical hernia  and midline lower abdominal hernia,  unremarkable abdominal US, contrast into colon 08/20/13 with no evidence  of obstruction.  S/p abdominal hysterectomy 2006. 3.  EGD yesterday was normal, colonoscopy shows normal mucosa, tranverse  colon polypectomy performed, moderate diverticulosis with normal  colon mucosa. 4.  Body mass index is 39.1 5.  AODM 6.  Hypertension.  Plan:  Nothing definitive found so far, I will discuss with Dr. Excell Seltzer later this AM.  LOS: 4 days    Alexee Delsanto 08/23/2013

## 2013-08-24 ENCOUNTER — Encounter (HOSPITAL_COMMUNITY): Payer: BC Managed Care – PPO | Admitting: Certified Registered Nurse Anesthetist

## 2013-08-24 ENCOUNTER — Encounter (HOSPITAL_COMMUNITY): Payer: Self-pay | Admitting: Certified Registered Nurse Anesthetist

## 2013-08-24 ENCOUNTER — Encounter (HOSPITAL_COMMUNITY): Admission: EM | Disposition: A | Discharge status: Home / Self Care | Payer: Self-pay | Source: Home / Self Care

## 2013-08-24 ENCOUNTER — Inpatient Hospital Stay (HOSPITAL_COMMUNITY): Payer: BC Managed Care – PPO | Admitting: Certified Registered Nurse Anesthetist

## 2013-08-24 DIAGNOSIS — K432 Incisional hernia without obstruction or gangrene: Secondary | ICD-10-CM

## 2013-08-24 HISTORY — PX: VENTRAL HERNIA REPAIR: SHX424

## 2013-08-24 HISTORY — PX: INSERTION OF MESH: SHX5868

## 2013-08-24 HISTORY — PX: PANNICULECTOMY: SHX5360

## 2013-08-24 LAB — GLUCOSE, CAPILLARY
Glucose-Capillary: 107 mg/dL — ABNORMAL HIGH (ref 70–99)
Glucose-Capillary: 111 mg/dL — ABNORMAL HIGH (ref 70–99)
Glucose-Capillary: 115 mg/dL — ABNORMAL HIGH (ref 70–99)
Glucose-Capillary: 116 mg/dL — ABNORMAL HIGH (ref 70–99)
Glucose-Capillary: 117 mg/dL — ABNORMAL HIGH (ref 70–99)
Glucose-Capillary: 124 mg/dL — ABNORMAL HIGH (ref 70–99)
Glucose-Capillary: 129 mg/dL — ABNORMAL HIGH (ref 70–99)
Glucose-Capillary: 130 mg/dL — ABNORMAL HIGH (ref 70–99)

## 2013-08-24 LAB — SURGICAL PCR SCREEN
MRSA, PCR: NEGATIVE
Staphylococcus aureus: NEGATIVE

## 2013-08-24 SURGERY — REPAIR, HERNIA, VENTRAL
Anesthesia: General | Site: Abdomen

## 2013-08-24 MED ORDER — ROCURONIUM BROMIDE 100 MG/10ML IV SOLN
INTRAVENOUS | Status: DC | PRN
  Administered 2013-08-24: 50 mg via INTRAVENOUS
  Administered 2013-08-24: 10 mg via INTRAVENOUS

## 2013-08-24 MED ORDER — EPHEDRINE SULFATE 50 MG/ML IJ SOLN
INTRAMUSCULAR | Status: AC
  Filled 2013-08-24: qty 1

## 2013-08-24 MED ORDER — STERILE WATER FOR INJECTION IJ SOLN
INTRAMUSCULAR | Status: AC
  Filled 2013-08-24: qty 10

## 2013-08-24 MED ORDER — HYDROMORPHONE 0.3 MG/ML IV SOLN
INTRAVENOUS | Status: DC
  Administered 2013-08-24: 19:00:00 via INTRAVENOUS
  Administered 2013-08-24: 0.3 mg via INTRAVENOUS
  Administered 2013-08-25: 0.9 mg via INTRAVENOUS
  Administered 2013-08-25 (×2): 0.3 mg via INTRAVENOUS
  Administered 2013-08-25 (×2): 1.7 mg via INTRAVENOUS
  Administered 2013-08-25: 1.2 mg via INTRAVENOUS
  Administered 2013-08-26: 07:00:00 via INTRAVENOUS
  Administered 2013-08-26: 0.3 mg via INTRAVENOUS
  Administered 2013-08-26: 1.2 mg via INTRAVENOUS
  Filled 2013-08-24 (×2): qty 25

## 2013-08-24 MED ORDER — ENOXAPARIN SODIUM 40 MG/0.4ML ~~LOC~~ SOLN
40.0000 mg | SUBCUTANEOUS | Status: DC
  Administered 2013-08-25 – 2013-08-28 (×4): 40 mg via SUBCUTANEOUS
  Filled 2013-08-24 (×5): qty 0.4

## 2013-08-24 MED ORDER — LACTATED RINGERS IV SOLN
INTRAVENOUS | Status: DC | PRN
  Administered 2013-08-24 (×3): via INTRAVENOUS

## 2013-08-24 MED ORDER — DIPHENHYDRAMINE HCL 50 MG/ML IJ SOLN: 12.5000 mg | Freq: Four times a day (QID) | INTRAMUSCULAR | Status: DC | PRN

## 2013-08-24 MED ORDER — EPHEDRINE SULFATE 50 MG/ML IJ SOLN
INTRAMUSCULAR | Status: DC | PRN
  Administered 2013-08-24 (×2): 5 mg via INTRAVENOUS

## 2013-08-24 MED ORDER — GLYCOPYRROLATE 0.2 MG/ML IJ SOLN
INTRAMUSCULAR | Status: DC | PRN
  Administered 2013-08-24: .8 mg via INTRAVENOUS

## 2013-08-24 MED ORDER — HYDROMORPHONE HCL PF 1 MG/ML IJ SOLN
0.5000 mg | Freq: Once | INTRAMUSCULAR | Status: AC
  Administered 2013-08-24: 0.5 mg via INTRAVENOUS
  Filled 2013-08-24: qty 1

## 2013-08-24 MED ORDER — MIDAZOLAM HCL 5 MG/5ML IJ SOLN
INTRAMUSCULAR | Status: DC | PRN
  Administered 2013-08-24: 2 mg via INTRAVENOUS

## 2013-08-24 MED ORDER — HYDROMORPHONE HCL PF 1 MG/ML IJ SOLN
0.2500 mg | INTRAMUSCULAR | Status: DC | PRN
  Administered 2013-08-24 (×4): 0.5 mg via INTRAVENOUS

## 2013-08-24 MED ORDER — ACETAMINOPHEN 10 MG/ML IV SOLN
INTRAVENOUS | Status: AC
  Filled 2013-08-24: qty 100

## 2013-08-24 MED ORDER — OXYCODONE HCL 5 MG PO TABS
5.0000 mg | ORAL_TABLET | Freq: Once | ORAL | Status: AC | PRN
  Administered 2013-08-24: 5 mg via ORAL

## 2013-08-24 MED ORDER — CIPROFLOXACIN IN D5W 400 MG/200ML IV SOLN
400.0000 mg | Freq: Two times a day (BID) | INTRAVENOUS | Status: DC
  Administered 2013-08-24: 400 mg via INTRAVENOUS
  Filled 2013-08-24: qty 200

## 2013-08-24 MED ORDER — FENTANYL CITRATE 0.05 MG/ML IJ SOLN
INTRAMUSCULAR | Status: AC
  Filled 2013-08-24: qty 5

## 2013-08-24 MED ORDER — MEPERIDINE HCL 25 MG/ML IJ SOLN: 6.2500 mg | INTRAMUSCULAR | Status: DC | PRN

## 2013-08-24 MED ORDER — PROPOFOL 10 MG/ML IV BOLUS
INTRAVENOUS | Status: AC
  Filled 2013-08-24: qty 20

## 2013-08-24 MED ORDER — ROCURONIUM BROMIDE 50 MG/5ML IV SOLN
INTRAVENOUS | Status: AC
  Filled 2013-08-24: qty 1

## 2013-08-24 MED ORDER — LIDOCAINE HCL (CARDIAC) 20 MG/ML IV SOLN
INTRAVENOUS | Status: DC | PRN
  Administered 2013-08-24: 100 mg via INTRAVENOUS

## 2013-08-24 MED ORDER — ACETAMINOPHEN 10 MG/ML IV SOLN
INTRAVENOUS | Status: DC | PRN
  Administered 2013-08-24: 1000 mg via INTRAVENOUS

## 2013-08-24 MED ORDER — ONDANSETRON HCL 4 MG/2ML IJ SOLN: 4.0000 mg | Freq: Four times a day (QID) | INTRAMUSCULAR | Status: DC | PRN

## 2013-08-24 MED ORDER — NEOSTIGMINE METHYLSULFATE 1 MG/ML IJ SOLN
INTRAMUSCULAR | Status: DC | PRN
  Administered 2013-08-24: 5 mg via INTRAVENOUS

## 2013-08-24 MED ORDER — MIDAZOLAM HCL 2 MG/2ML IJ SOLN
INTRAMUSCULAR | Status: AC
  Filled 2013-08-24: qty 2

## 2013-08-24 MED ORDER — NALOXONE HCL 0.4 MG/ML IJ SOLN: 0.4000 mg | INTRAMUSCULAR | Status: DC | PRN

## 2013-08-24 MED ORDER — PHENYLEPHRINE 40 MCG/ML (10ML) SYRINGE FOR IV PUSH (FOR BLOOD PRESSURE SUPPORT)
PREFILLED_SYRINGE | INTRAVENOUS | Status: AC
Start: 2013-08-24 — End: 2013-08-24
  Filled 2013-08-24: qty 10

## 2013-08-24 MED ORDER — NEOSTIGMINE METHYLSULFATE 1 MG/ML IJ SOLN
INTRAMUSCULAR | Status: AC
  Filled 2013-08-24: qty 10

## 2013-08-24 MED ORDER — ACETAMINOPHEN 10 MG/ML IV SOLN
1000.0000 mg | Freq: Four times a day (QID) | INTRAVENOUS | Status: AC
  Administered 2013-08-24 – 2013-08-25 (×4): 1000 mg via INTRAVENOUS
  Filled 2013-08-24 (×4): qty 100

## 2013-08-24 MED ORDER — GLYCOPYRROLATE 0.2 MG/ML IJ SOLN
INTRAMUSCULAR | Status: AC
  Filled 2013-08-24: qty 4

## 2013-08-24 MED ORDER — HYDROMORPHONE HCL PF 1 MG/ML IJ SOLN
INTRAMUSCULAR | Status: AC
  Filled 2013-08-24: qty 1

## 2013-08-24 MED ORDER — PROPOFOL 10 MG/ML IV BOLUS
INTRAVENOUS | Status: DC | PRN
  Administered 2013-08-24: 150 mg via INTRAVENOUS
  Administered 2013-08-24: 50 mg via INTRAVENOUS

## 2013-08-24 MED ORDER — LACTATED RINGERS IV SOLN
INTRAVENOUS | Status: DC
  Administered 2013-08-24: 11:00:00 via INTRAVENOUS

## 2013-08-24 MED ORDER — OXYCODONE HCL 5 MG/5ML PO SOLN: 5.0000 mg | Freq: Once | ORAL | Status: AC | PRN

## 2013-08-24 MED ORDER — LIDOCAINE HCL (CARDIAC) 20 MG/ML IV SOLN
INTRAVENOUS | Status: AC
  Filled 2013-08-24: qty 5

## 2013-08-24 MED ORDER — DIPHENHYDRAMINE HCL 12.5 MG/5ML PO ELIX
12.5000 mg | ORAL_SOLUTION | Freq: Four times a day (QID) | ORAL | Status: DC | PRN
  Filled 2013-08-24: qty 5

## 2013-08-24 MED ORDER — FENTANYL CITRATE 0.05 MG/ML IJ SOLN
INTRAMUSCULAR | Status: DC | PRN
  Administered 2013-08-24: 25 ug via INTRAVENOUS
  Administered 2013-08-24: 150 ug via INTRAVENOUS
  Administered 2013-08-24: 50 ug via INTRAVENOUS
  Administered 2013-08-24: 25 ug via INTRAVENOUS
  Administered 2013-08-24 (×2): 50 ug via INTRAVENOUS
  Administered 2013-08-24: 25 ug via INTRAVENOUS
  Administered 2013-08-24: 50 ug via INTRAVENOUS
  Administered 2013-08-24: 25 ug via INTRAVENOUS
  Administered 2013-08-24: 50 ug via INTRAVENOUS

## 2013-08-24 MED ORDER — 0.9 % SODIUM CHLORIDE (POUR BTL) OPTIME
TOPICAL | Status: DC | PRN
  Administered 2013-08-24: 1000 mL

## 2013-08-24 MED ORDER — ONDANSETRON HCL 4 MG/2ML IJ SOLN
INTRAMUSCULAR | Status: AC
  Filled 2013-08-24: qty 2

## 2013-08-24 MED ORDER — ONDANSETRON HCL 4 MG/2ML IJ SOLN: 4.0000 mg | Freq: Once | INTRAMUSCULAR | Status: DC | PRN

## 2013-08-24 MED ORDER — ONDANSETRON HCL 4 MG/2ML IJ SOLN
INTRAMUSCULAR | Status: DC | PRN
  Administered 2013-08-24: 4 mg via INTRAVENOUS

## 2013-08-24 MED ORDER — PHENYLEPHRINE HCL 10 MG/ML IJ SOLN
INTRAMUSCULAR | Status: DC | PRN
  Administered 2013-08-24 (×4): 80 ug via INTRAVENOUS

## 2013-08-24 MED ORDER — SODIUM CHLORIDE 0.9 % IJ SOLN: 9.0000 mL | INTRAMUSCULAR | Status: DC | PRN

## 2013-08-24 MED ORDER — OXYCODONE HCL 5 MG PO TABS
ORAL_TABLET | ORAL | Status: AC
  Filled 2013-08-24: qty 1

## 2013-08-24 SURGICAL SUPPLY — 60 items
BAG DECANTER FOR FLEXI CONT (MISCELLANEOUS) ×1 IMPLANT
BINDER ABD UNIV 12 45-62 (WOUND CARE) IMPLANT
BINDER ABDOMINAL 46IN 62IN (WOUND CARE) ×2
BLADE SURG ROTATE 9660 (MISCELLANEOUS) ×1 IMPLANT
CANISTER SUCTION 2500CC (MISCELLANEOUS) ×2 IMPLANT
CHLORAPREP W/TINT 26ML (MISCELLANEOUS) ×2 IMPLANT
COVER SURGICAL LIGHT HANDLE (MISCELLANEOUS) ×2 IMPLANT
DEVICE SECURE STRAP 25 ABSORB (INSTRUMENTS) ×1 IMPLANT
DRAIN CHANNEL 19F RND (DRAIN) ×1 IMPLANT
DRAPE INCISE IOBAN 66X45 STRL (DRAPES) ×2 IMPLANT
DRAPE LAPAROSCOPIC ABDOMINAL (DRAPES) ×2 IMPLANT
DRAPE UTILITY 15X26 W/TAPE STR (DRAPE) ×4 IMPLANT
ELECT CAUTERY BLADE 6.4 (BLADE) ×2 IMPLANT
ELECT REM PT RETURN 9FT ADLT (ELECTROSURGICAL) ×2
ELECTRODE REM PT RTRN 9FT ADLT (ELECTROSURGICAL) ×1 IMPLANT
EVACUATOR SILICONE 100CC (DRAIN) ×1 IMPLANT
GLOVE BIO SURGEON STRL SZ7.5 (GLOVE) ×1 IMPLANT
GLOVE BIOGEL PI IND STRL 6.5 (GLOVE) IMPLANT
GLOVE BIOGEL PI IND STRL 7.5 (GLOVE) IMPLANT
GLOVE BIOGEL PI IND STRL 8 (GLOVE) ×1 IMPLANT
GLOVE BIOGEL PI INDICATOR 6.5 (GLOVE) ×2
GLOVE BIOGEL PI INDICATOR 7.5 (GLOVE) ×3
GLOVE BIOGEL PI INDICATOR 8 (GLOVE) ×2
GLOVE ECLIPSE 6.5 STRL STRAW (GLOVE) ×1 IMPLANT
GLOVE SS BIOGEL STRL SZ 7.5 (GLOVE) ×1 IMPLANT
GLOVE SUPERSENSE BIOGEL SZ 7.5 (GLOVE) ×2
GLOVE SURG SS PI 6.0 STRL IVOR (GLOVE) ×1 IMPLANT
GOWN STRL REUS W/ TWL LRG LVL3 (GOWN DISPOSABLE) IMPLANT
GOWN STRL REUS W/ TWL XL LVL3 (GOWN DISPOSABLE) IMPLANT
GOWN STRL REUS W/TWL LRG LVL3 (GOWN DISPOSABLE) ×4
GOWN STRL REUS W/TWL XL LVL3 (GOWN DISPOSABLE) ×4
KIT BASIN OR (CUSTOM PROCEDURE TRAY) ×2 IMPLANT
KIT ROOM TURNOVER OR (KITS) ×2 IMPLANT
MESH VENT ST 22.1X27.1CM OVL (Mesh General) ×1 IMPLANT
NS IRRIG 1000ML POUR BTL (IV SOLUTION) ×2 IMPLANT
PACK GENERAL/GYN (CUSTOM PROCEDURE TRAY) ×2 IMPLANT
PAD ARMBOARD 7.5X6 YLW CONV (MISCELLANEOUS) ×4 IMPLANT
PEN SKIN MARKING BROAD (MISCELLANEOUS) ×1 IMPLANT
SLEEVE SURGEON STRL (DRAPES) ×1 IMPLANT
SPECIMEN JAR X LARGE (MISCELLANEOUS) ×1 IMPLANT
SPONGE GAUZE 4X4 12PLY (GAUZE/BANDAGES/DRESSINGS) IMPLANT
SPONGE LAP 18X18 X RAY DECT (DISPOSABLE) ×1 IMPLANT
STAPLER VISISTAT 35W (STAPLE) ×2 IMPLANT
SUT CHROMIC 3 0 SH 27 (SUTURE) IMPLANT
SUT ETHILON 2 0 FS 18 (SUTURE) ×1 IMPLANT
SUT NOVA NAB GS-21 0 18 T12 DT (SUTURE) ×4 IMPLANT
SUT NOVA T20/GS 25 (SUTURE) ×2 IMPLANT
SUT PROLENE 0 CT 1 30 (SUTURE) IMPLANT
SUT PROLENE 0 CT 1 CR/8 (SUTURE) IMPLANT
SUT SILK 3 0 (SUTURE)
SUT SILK 3 0 SH CR/8 (SUTURE) IMPLANT
SUT SILK 3-0 18XBRD TIE 12 (SUTURE) IMPLANT
SUT VIC AB 2-0 CT1 27 (SUTURE) ×8
SUT VIC AB 2-0 CT1 TAPERPNT 27 (SUTURE) IMPLANT
SUT VIC AB 3-0 SH 27 (SUTURE)
SUT VIC AB 3-0 SH 27XBRD (SUTURE) IMPLANT
TOWEL OR 17X24 6PK STRL BLUE (TOWEL DISPOSABLE) ×1 IMPLANT
TOWEL OR 17X26 10 PK STRL BLUE (TOWEL DISPOSABLE) ×2 IMPLANT
TRAY FOLEY CATH 14FRSI W/METER (CATHETERS) ×1 IMPLANT
WATER STERILE IRR 1000ML POUR (IV SOLUTION) IMPLANT

## 2013-08-24 NOTE — Preoperative (Signed)
Beta Blockers   Reason not to administer Beta Blockers:Not Applicable 

## 2013-08-24 NOTE — Anesthesia Procedure Notes (Signed)
Procedure Name: Intubation Date/Time: 08/24/2013 11:59 AM Performed by: Blair Heys E Pre-anesthesia Checklist: Patient identified, Emergency Drugs available, Suction available and Patient being monitored Patient Re-evaluated:Patient Re-evaluated prior to inductionOxygen Delivery Method: Circle system utilized Preoxygenation: Pre-oxygenation with 100% oxygen Intubation Type: IV induction Ventilation: Mask ventilation without difficulty Laryngoscope Size: Miller and 2 Grade View: Grade I Tube type: Oral Tube size: 7.5 mm Number of attempts: 1 Airway Equipment and Method: Stylet Placement Confirmation: ETT inserted through vocal cords under direct vision,  positive ETCO2 and breath sounds checked- equal and bilateral Secured at: 22 cm Tube secured with: Tape Dental Injury: Teeth and Oropharynx as per pre-operative assessment

## 2013-08-24 NOTE — Op Note (Signed)
Preoperative Diagnosis: ventral incisional hernia  Postoprative Diagnosis: Same  Procedure: Procedure(s): HERNIA REPAIR VENTRAL ADULT INSERTION OF MESH PANNICULECTOMY   Surgeon: Excell Seltzer T   Assistants: Georganna Skeans  Anesthesia:  General endotracheal anesthesia  Indications: patient is a 58 year old female admitted the emergency room with acute generalized abdominal pain several days ago. CT scan has shown a ventral incisional hernia in the low midline from a previous large midline incision for removal of a giant uterine fibroid. There is also a smaller hernia at the umbilicus. She has had extensive workup including upper and lower endoscopy with no other cause for abdominal pain found. We have discussed options for management which are detailed extensively elsewhere and we have elected to proceed with repair of her incisional hernia with panniculectomy. I discussed the surgery and risks also detailed elsewhere.  Procedure Detail: patient was brought to the operating room, placed in the supine position on operating table and general endotracheal anesthesia induced. Foley catheter was placed. She received preoperative IV antibiotics. The abdomen was widely sterilely prepped and draped using Ioban drape. Patient timeout was performed and correct procedure verified. I planned an elliptical panniculectomy incision extending from iliac crest to iliac crest. Skin and subcutaneous tissue was divided with a scalpel and cautery down to the anterior fascia. Beginning on either side the pannus was reflected up off of the anterior fascia working toward the midline. A large hernia sac was encountered in the low midline about 5 cm above the pubis. The pannus was completely dissected off of the hernia sac and removed and sent for permanent pathology. I then raised a flap using the superior end of the incision to up above the umbilicus where we encountered a additional small hernia sac. We raised a very  short inferior flap down to the pubis. The midline between these 2 hernias was somewhat patulous and I incised the fascia vertically between the 2 areas and opened up both hernia sacs. Omentum was dissected away from the abdominal wall and the anterior abdominal wall completely cleared. He was noted to be a couple small Swiss cheese type defects the lower midline as well. The hernia sac was debrided back to healthy fascia on either side. The fascia would come back to the midline under no significant tension. I then used a 27 x 20 cm piece of Ventrio mesh which was placed intra-abdominally with very wide coverage beyond the incision all directions. Initially the outer ring was sutured securely to the fascia at the pubis in the lower midline allowing the mesh to extend the little bit further inferiorly into the pelvis with wide overlap of the lower incision. Following this using further 0 Novafil sutures the outer ring of the mesh was sutured up to the anterior abdominal wall circumferentially with very broad coverage out to the flanks laterally and 7 or 8 cm above the umbilicus superiorly. As I came down to the pelvis the mesh was allowed to overlap onto the pelvic sidewalls and I used the inner ring to suture up to the intra-abdominal wall being careful not to place any sutures down into the pelvic sidewall. After these sutures were placed about 2 cm apart each circumferentially with good deployment of the mesh the Securestrap tacker was used within the cuff of the mesh circumferentially to further secure the mesh and  Obliterated any gaps between the abdominal wall and the mesh. The wound was irrigated. The midline fascia was then closed over the mesh with running #1 Novafil begun at  either end and tied centrally. Subcutaneous tissue was thoroughly irrigated. A closed suction drain was left in the wound. The subcutaneous tissue was closed in 2 layers with running 2-0 Vicryl and skin was closed with staples. Sponge  needle and instrument counts were correct.    Findings: As above  Estimated Blood Loss:  less than 100 mL         Drains: 19 round Blake in subcutaneous tissue  Blood Given: none          Specimens: abdominal pannus        Complications:  * No complications entered in OR log *         Disposition: PACU - hemodynamically stable.         Condition: stable

## 2013-08-24 NOTE — Progress Notes (Signed)
Pt. Was not on PCA when she got back to room from PACU.  RN ordered PCA pump and called surgery and spoke with the answering service for a one time order for pain medication until we get PCA started.  Will continue to follow and await for order.  Alphonzo Lemmings, RN

## 2013-08-24 NOTE — Anesthesia Preprocedure Evaluation (Addendum)
Anesthesia Evaluation  Patient identified by MRN, date of birth, ID band Patient awake    Reviewed: Allergy & Precautions, H&P , NPO status , Patient's Chart, lab work & pertinent test results  Airway Mallampati: II TM Distance: >3 FB Neck ROM: Full    Dental  (+) Dental Advisory Given, Missing   Pulmonary          Cardiovascular hypertension, Pt. on medications     Neuro/Psych    GI/Hepatic   Endo/Other  diabetes, Type 2, Oral Hypoglycemic AgentsMorbid obesity  Renal/GU      Musculoskeletal   Abdominal   Peds  Hematology   Anesthesia Other Findings   Reproductive/Obstetrics                         Anesthesia Physical Anesthesia Plan  ASA: III  Anesthesia Plan: General   Post-op Pain Management:    Induction: Intravenous  Airway Management Planned: Oral ETT  Additional Equipment:   Intra-op Plan:   Post-operative Plan: Extubation in OR  Informed Consent: I have reviewed the patients History and Physical, chart, labs and discussed the procedure including the risks, benefits and alternatives for the proposed anesthesia with the patient or authorized representative who has indicated his/her understanding and acceptance.   Dental advisory given  Plan Discussed with: CRNA and Surgeon  Anesthesia Plan Comments:        Anesthesia Quick Evaluation

## 2013-08-24 NOTE — Care Management Note (Signed)
    Page 1 of 2   08/29/2013     10:49:07 AM   CARE MANAGEMENT NOTE 08/29/2013  Patient:  [name], [name]   Account Number:  [id number]  Date Initiated:  08/24/2013  Documentation initiated by:  Tomi Bamberger  Subjective/Objective Assessment:   dx abd pain of uncertain cause, ventrical hernis  admit     Action/Plan:   3/5- hernia repair today   Anticipated DC Date:  08/29/2013   Anticipated DC Plan:  [facility]  CM consult      Choice offered to / List presented to:             Status of service:  Completed, signed off Medicare Important Message given?   (If response is "NO", the following Medicare IM given date fields will be blank) Date Medicare IM given:   Date Additional Medicare IM given:    Discharge Disposition:  HOME/SELF CARE  Per UR Regulation:  Reviewed for med. necessity/level of care/duration of stay  If discussed at [place] of Stay Meetings, dates discussed:   08/24/2013  08/29/2013    Comments:  08/29/13 [place], BSN [phone] patient for dc today, no needs anticipated.  08/28/13 Tomi Bamberger RN, BSN [phone] patient is pod 4 of ventral hernia repair with mesh and panniculectomy, conts on clears, await bowel function, jp drain .  NCM will continue to monitor for dc needs.  08/25/13 [place], BSN [phone] post op day 1- patient on pca, ivf, antiemetics, advance diet to clears.  08/24/13 [place] ,BSN [phone] patient is for ventrical hernia repair today.

## 2013-08-24 NOTE — Transfer of Care (Signed)
Immediate Anesthesia Transfer of Care Note  Patient: [name]  Procedure(s) Performed: Procedure(s): HERNIA REPAIR VENTRAL ADULT (N/A) INSERTION OF MESH (N/A) PANNICULECTOMY (N/A)  Patient Location: PACU  Anesthesia Type:General  Level of Consciousness: awake and alert   Airway & Oxygen Therapy: Patient Spontanous Breathing and Patient connected to nasal cannula oxygen  Post-op Assessment: Report given to PACU RN, Post -op Vital signs reviewed and stable and Patient moving all extremities X 4  Post vital signs: Reviewed and stable  Complications: No apparent anesthesia complications

## 2013-08-24 NOTE — Progress Notes (Signed)
Providing lunch relief 

## 2013-08-24 NOTE — Progress Notes (Signed)
[facility] PROGRESS NOTE  [name] FXT:[id number] DOB: [birth year] DOA: 08/19/2013 PCP: No primary provider on file. Brief Narrative: [name] is a 58 y.o. female with PMH of DM, HTN, obesity, H/o Total Abdominal hysterectomy in 2007 presents to the ER from home today with abdominal pain.She reported abdominal pain, distention, nausea for the last 2 weeks, progressively worsened over the last 4 days.  In ER, labs unremarkably, lipase 67, CT abd with abd wall hernia with small bowel loop without definitive obstruction.  Seen by CCS who recommended admission per [facility] for possible pancreatitis. Surgery wanted GI eval to r/o other causes of pain before making decision about hernia. GI performed EGD and colonoscopy and no GI cause for pain found despite extensive workup-GI signed off.  Assessment/Plan: 1. Abdominal pain/Nausea/Vomiting: Unclear etiology. Lipase of 67 on admission and now 49 not suggestive of acute pancreatitis. Abd [name] negative for gall stones. GI evaluated and extensive workup performed including EGD and colonoscopy which did not reveal etiology. Surgeon's planning hernia repair on 3/5. 2. Ventral incisional hernia: Possibly cause of her abdominal pain and GI symptoms. Status post herniorrhaphy with insertion of mesh and panniculectomy on 3/5. Management per general surgery. 3. DM: hold metformin, SSI. Reasonable inpatient control.  4. HTN: stable, continue losartan 5. Cough/URI: likely worsening abdominal symptoms/hernia etc recently. continue mucinex. Improved     Code Status: Full Code Family Communication: Discussed with spouse at bedside Disposition Plan: home when stable   Consultants:  CCS  HPI/Subjective: Abdominal pain "tolerable". No nausea or vomiting. States independent of activities at home and no history of cardiac disease except heart murmur-active physically without chest pain or dyspnea at home.   Objective: Filed Vitals:   08/24/13 0512   BP: 115/72  Pulse: 59  Temp: 98.8 F (37.1 C)  Resp: 18    Intake/Output Summary (Last 24 hours) at 08/24/13 0722 Last data filed at 08/24/13 0600  Gross per 24 hour  Intake 2926.25 ml  Output      0 ml  Net 2926.25 ml   Filed Weights   08/19/13 0655  Weight: 98.431 kg (217 lb)    Exam: Patient was seen prior to surgery.   General:  AAOx3, no distress  Cardiovascular: S1S2/RRR  Respiratory: CTAB  Abdomen: soft, less distended, BS present, old healed surgical scars. Mild mid abdominal tenderness without peritoneal signs.  Musculoskeletal: no edema c/c   Data Reviewed: Basic Metabolic Panel:  Recent Labs Lab 08/19/13 0657 08/20/13 0550 08/21/13 0603  NA 140 140 138  K 4.1 4.5 4.0  CL 100 102 101  CO2 27 28 26   GLUCOSE 138* 137* 135*  BUN 12 10 8   CREATININE 0.83 0.79 0.78  CALCIUM 9.4 9.1 9.0   Liver Function Tests:  Recent Labs Lab 08/19/13 0657 08/20/13 0550 08/21/13 0603  AST 11 10 9   ALT 13 11 10   ALKPHOS 97 86 88  BILITOT 0.3 0.3 0.3  PROT 7.8 6.9 6.8  ALBUMIN 3.7 3.2* 3.2*    Recent Labs Lab 08/19/13 0657 08/20/13 0550  LIPASE 67* 49   No results found for this basename: AMMONIA,  in the last 168 hours CBC:  Recent Labs Lab 08/19/13 0657 08/20/13 0550  WBC 8.4 7.5  NEUTROABS 4.6  --   HGB 14.3 13.0  HCT 40.9 39.7  MCV 85.0 87.1  PLT 341 314   Cardiac Enzymes: No results found for this basename: CKTOTAL, CKMB, CKMBINDEX, TROPONINI,  in the last 168 hours BNP (  last 3 results) No results found for this basename: PROBNP,  in the last 8760 hours CBG:  Recent Labs Lab 08/23/13 1147 08/23/13 1651 08/23/13 2046 08/24/13 0007 08/24/13 0515  GLUCAP 183* 121* 162* 130* 117*    No results found for this or any previous visit (from the past 240 hour(s)).   Studies: No results found.  Scheduled Meds: . enoxaparin (LOVENOX) injection  40 mg Subcutaneous Q24H  . insulin aspart  0-9 Units Subcutaneous 6 times per day  .  latanoprost  1 drop Both Eyes QHS  . pantoprazole  40 mg Oral Q0600   Continuous Infusions: . sodium chloride 75 mL/hr at 08/24/13 0205   Antibiotics Given (last 72 hours)   None      Active Problems:   Abdominal pain   Diabetes mellitus   Essential hypertension, benign   Ventral hernia   Benign neoplasm of colon   Special screening for malignant neoplasms, colon    Time spent: 7min    [name]  [facility] Pager [phone]. If 7PM-7AM, please contact night-coverage at [email or web address], password [email or web address] 08/24/2013, 7:22 AM  LOS: 5 days

## 2013-08-24 NOTE — Anesthesia Postprocedure Evaluation (Signed)
Anesthesia Post Note  Patient: [name]  Procedure(s) Performed: Procedure(s) (LRB): HERNIA REPAIR VENTRAL ADULT (N/A) INSERTION OF MESH (N/A) PANNICULECTOMY (N/A)  Anesthesia type: general  Patient location: PACU  Post pain: Pain level controlled  Post assessment: Patient's Cardiovascular Status Stable  Last Vitals:  Filed Vitals:   08/24/13 1721  BP: 117/78  Pulse: 60  Temp: 36.6 C  Resp: 14    Post vital signs: Reviewed and stable  Level of consciousness: sedated  Complications: No apparent anesthesia complications

## 2013-08-25 ENCOUNTER — Encounter (HOSPITAL_COMMUNITY): Payer: Self-pay | Admitting: General Surgery

## 2013-08-25 LAB — CBC
HCT: 35.9 % — ABNORMAL LOW (ref 36.0–46.0)
Hemoglobin: 12.2 g/dL (ref 12.0–15.0)
MCH: 29 pg (ref 26.0–34.0)
MCHC: 34 g/dL (ref 30.0–36.0)
MCV: 85.5 fL (ref 78.0–100.0)
Platelets: 258 10*3/uL (ref 150–400)
RBC: 4.2 MIL/uL (ref 3.87–5.11)
RDW: 14.5 % (ref 11.5–15.5)
WBC: 9.5 10*3/uL (ref 4.0–10.5)

## 2013-08-25 LAB — BASIC METABOLIC PANEL
BUN: 7 mg/dL (ref 6–23)
CO2: 21 mEq/L (ref 19–32)
Calcium: 8.3 mg/dL — ABNORMAL LOW (ref 8.4–10.5)
Chloride: 103 mEq/L (ref 96–112)
Creatinine, Ser: 0.7 mg/dL (ref 0.50–1.10)
GFR calc Af Amer: >90 mL/min (ref >90)
GFR calc non Af Amer: >90 mL/min (ref >90)
Glucose, Bld: 101 mg/dL — ABNORMAL HIGH (ref 70–99)
Potassium: 3.7 mEq/L (ref 3.7–5.3)
Sodium: 139 mEq/L (ref 137–147)

## 2013-08-25 LAB — GLUCOSE, CAPILLARY
Glucose-Capillary: 93 mg/dL (ref 70–99)
Glucose-Capillary: 110 mg/dL — ABNORMAL HIGH (ref 70–99)
Glucose-Capillary: 96 mg/dL (ref 70–99)
Glucose-Capillary: 111 mg/dL — ABNORMAL HIGH (ref 70–99)
Glucose-Capillary: 109 mg/dL — ABNORMAL HIGH (ref 70–99)

## 2013-08-25 NOTE — Progress Notes (Addendum)
[facility] PROGRESS NOTE  [name] XBM:[id number] DOB: [birth year] DOA: 08/19/2013 PCP: No primary provider on file. Brief Narrative: [name] is a 58 y.o. female with PMH of DM, HTN, obesity, H/o Total Abdominal hysterectomy in 2007 presents to the ER from home today with abdominal pain.She reported abdominal pain, distention, nausea for the last 2 weeks, progressively worsened over the last 4 days.  In ER, labs unremarkably, lipase 67, CT abd with abd wall hernia with small bowel loop without definitive obstruction.  Seen by CCS who recommended admission per [facility] for possible pancreatitis. Surgery wanted GI eval to r/o other causes of pain before making decision about hernia. GI performed EGD and colonoscopy and no GI cause for pain found despite extensive workup-GI signed off.  Assessment/Plan: 1. Abdominal pain/Nausea/Vomiting: Unclear etiology. Lipase of 67 on admission and now 49 not suggestive of acute pancreatitis. Abd [name] negative for gall stones. GI evaluated and extensive workup performed including EGD and colonoscopy which did not reveal etiology. ? Secondary to ventral incisional hernia. 2. Ventral incisional hernia: Possibly cause of her abdominal pain and GI symptoms. Status post herniorrhaphy with insertion of mesh and panniculectomy on 3/5. Management per general surgery. Discussed with surgery who have accepted care on to their service on 08/25/13. Hospitalist service will sign off on 08/25/13. 3. DM: hold metformin, SSI. Reasonable inpatient control. May resume metformin at discharge provided no contraindications. 4. HTN: stable, continue losartan 5. Cough/URI: likely worsening abdominal symptoms/hernia etc recently. Resolved    Code Status: Full Code Family Communication: None at bedside. Disposition Plan: As per the surgical team.   Consultants:  CCS  HPI/Subjective: rated pain as 3/10 this morning. No nausea, vomiting or flatus.  Objective: Filed  Vitals:   08/25/13 1558  BP:   Pulse:   Temp:   Resp: 19   temperature 98.64F, pulse 60 per minute, respiration 19 per minute, blood pressure 122/76 and oxygen saturation 98%.  Intake/Output Summary (Last 24 hours) at 08/25/13 1756 Last data filed at 08/25/13 1655  Gross per 24 hour  Intake   1090 ml  Output    477 ml  Net    613 ml   Filed Weights   08/19/13 0655  Weight: 98.431 kg (217 lb)    Exam: Patient was seen prior to surgery.   General:  AAOx3, no distress  Cardiovascular: S1S2/RRR  Respiratory: CTAB  Abdomen: Has a binder. Mildly tender without peritoneal signs.  Musculoskeletal: no edema c/c   Data Reviewed: Basic Metabolic Panel:  Recent Labs Lab 08/19/13 0657 08/20/13 0550 08/21/13 0603 08/25/13 0542  NA 140 140 138 139  K 4.1 4.5 4.0 3.7  CL 100 102 101 103  CO2 27 28 26 21   GLUCOSE 138* 137* 135* 101*  BUN 12 10 8 7   CREATININE 0.83 0.79 0.78 0.70  CALCIUM 9.4 9.1 9.0 8.3*   Liver Function Tests:  Recent Labs Lab 08/19/13 0657 08/20/13 0550 08/21/13 0603  AST 11 10 9   ALT 13 11 10   ALKPHOS 97 86 88  BILITOT 0.3 0.3 0.3  PROT 7.8 6.9 6.8  ALBUMIN 3.7 3.2* 3.2*    Recent Labs Lab 08/19/13 0657 08/20/13 0550  LIPASE 67* 49   No results found for this basename: AMMONIA,  in the last 168 hours CBC:  Recent Labs Lab 08/19/13 0657 08/20/13 0550 08/25/13 0542  WBC 8.4 7.5 9.5  NEUTROABS 4.6  --   --   HGB 14.3 13.0 12.2  HCT 40.9  39.7 35.9*  MCV 85.0 87.1 85.5  PLT 341 314 258   Cardiac Enzymes: No results found for this basename: CKTOTAL, CKMB, CKMBINDEX, TROPONINI,  in the last 168 hours BNP (last 3 results) No results found for this basename: PROBNP,  in the last 8760 hours CBG:  Recent Labs Lab 08/24/13 2320 08/25/13 0404 08/25/13 0751 08/25/13 1200 08/25/13 1712  GLUCAP 107* 93 110* 96 111*    Recent Results (from the past 240 hour(s))  SURGICAL PCR SCREEN     Status: None   Collection Time     08/24/13  5:55 AM      Result Value Ref Range Status   MRSA, PCR NEGATIVE  NEGATIVE Final   Staphylococcus aureus NEGATIVE  NEGATIVE Final   Comment:            The Xpert SA Assay (FDA     approved for NASAL specimens     in patients over 53 years of age),     is one component of     a comprehensive surveillance     program.  Test performance has     been validated by [employer] for patients greater     than or equal to 40 year old.     It is not intended     to diagnose infection nor to     guide or monitor treatment.     Studies: No results found.  Scheduled Meds: . enoxaparin (LOVENOX) injection  40 mg Subcutaneous Q24H  . HYDROmorphone PCA 0.3 mg/mL   Intravenous 6 times per day  . insulin aspart  0-9 Units Subcutaneous 6 times per day  . latanoprost  1 drop Both Eyes QHS  . pantoprazole  40 mg Oral Q0600   Continuous Infusions: . sodium chloride 75 mL/hr (08/25/13 1327)   Antibiotics Given (last 72 hours)   Date/Time Action Medication Dose   08/24/13 1222 Given   ciprofloxacin (CIPRO) IVPB 400 mg 400 mg      Active Problems:   Abdominal pain   Diabetes mellitus   Essential hypertension, benign   Ventral hernia   Benign neoplasm of colon   Special screening for malignant neoplasms, colon    Time spent: 26min    Aleatha Taite  [facility] Pager [phone]. If 7PM-7AM, please contact night-coverage at [email or web address], password [facility] 08/25/2013, 5:56 PM  LOS: 6 days

## 2013-08-25 NOTE — Progress Notes (Signed)
Patient interviewed and examined, agree with PA note above.  Edward Jolly MD, FACS  08/25/2013 6:07 PM

## 2013-08-25 NOTE — Progress Notes (Signed)
1 Day Post-Op  Subjective: Pt doing well, sore, but pain well controlled.  No N/V.  Hungry.  Wants to get up and walk.  Using IS.  Looking forward to going home.  No flatus or BM yet.  Objective: Vital signs in last 24 hours: Temp:  [97.5 F (36.4 C)-98.7 F (37.1 C)] 97.5 F (36.4 C) (03/06 0422) Pulse Rate:  [57-74] 63 (03/06 0422) Resp:  [11-18] 18 (03/06 0819) BP: (99-151)/(63-87) 99/64 mmHg (03/06 0422) SpO2:  [96 %-100 %] 99 % (03/06 0819) Last BM Date: 08/24/13  Intake/Output from previous day: 03/05 0701 - 03/06 0700 In: 3600 [P.O.:60; I.V.:3340; IV Piggyback:200] Out: 1437 [Urine:1080; Drains:157; Blood:200] Intake/Output this shift:    PE: Gen:  Alert, NAD, pleasant Abd: Soft, mild distension, mild tenderness in lower abdomen, few BS, no HSM, incisions C/D/I with gauze/tape dressing in place across the entire lower abdomen and LLQ, drain in LLQ with serosanguinous drainage (239mL/24 hours)   Lab Results:   Recent Labs  08/25/13 0542  WBC 9.5  HGB 12.2  HCT 35.9*  PLT 258   BMET  Recent Labs  08/25/13 0542  NA 139  K 3.7  CL 103  CO2 21  GLUCOSE 101*  BUN 7  CREATININE 0.70  CALCIUM 8.3*   PT/INR No results found for this basename: LABPROT, INR,  in the last 72 hours CMP     Component Value Date/Time   NA 139 08/25/2013 0542   K 3.7 08/25/2013 0542   CL 103 08/25/2013 0542   CO2 21 08/25/2013 0542   GLUCOSE 101* 08/25/2013 0542   BUN 7 08/25/2013 0542   CREATININE 0.70 08/25/2013 0542   CALCIUM 8.3* 08/25/2013 0542   PROT 6.8 08/21/2013 0603   ALBUMIN 3.2* 08/21/2013 0603   AST 9 08/21/2013 0603   ALT 10 08/21/2013 0603   ALKPHOS 88 08/21/2013 0603   BILITOT 0.3 08/21/2013 0603   GFRNONAA >90 08/25/2013 0542   GFRAA >90 08/25/2013 0542   Lipase     Component Value Date/Time   LIPASE 49 08/20/2013 0550       Studies/Results: No results found.  Anti-infectives: Anti-infectives   Start     Dose/Rate Route Frequency Ordered Stop   08/24/13 1230   ciprofloxacin (CIPRO) IVPB 400 mg  Status:  Discontinued     400 mg 200 mL/hr over 60 Minutes Intravenous Every 12 hours 08/24/13 1220 08/24/13 1703       Assessment/Plan 1. POD #1 s/p Ventral hernia repair with mesh & panniculectomy 2. CT shows small umbilical hernia and midline lower abdominal hernia, unremarkable abdominal US, contrast into colon 08/20/13 with no evidence of obstruction. S/p abdominal hysterectomy 2006.  3. EGD yesterday was normal, colonoscopy shows normal mucosa, tranverse colon polypectomy performed, moderate diverticulosis with normal colon mucosa.  4. Body mass index is 39.1  5. AODM  6. Hypertension.   Plan:  1.  IVF, PCA, antiemetics 2.  Advance to clears and await for bowel function 3.  D/c foley, ambulate and IS (at 1500) 4.  SCD's and lovenox 5.  Hopefully d/c PCA tomorrow 6.  Keep dressing in place for 48-72 hours     LOS: 6 days    [name], [name] 08/25/2013, 9:57 AM Pager: [phone]

## 2013-08-26 LAB — GLUCOSE, CAPILLARY
Glucose-Capillary: 103 mg/dL — ABNORMAL HIGH (ref 70–99)
Glucose-Capillary: 109 mg/dL — ABNORMAL HIGH (ref 70–99)
Glucose-Capillary: 114 mg/dL — ABNORMAL HIGH (ref 70–99)
Glucose-Capillary: 123 mg/dL — ABNORMAL HIGH (ref 70–99)
Glucose-Capillary: 123 mg/dL — ABNORMAL HIGH (ref 70–99)
Glucose-Capillary: 131 mg/dL — ABNORMAL HIGH (ref 70–99)
Glucose-Capillary: 135 mg/dL — ABNORMAL HIGH (ref 70–99)

## 2013-08-26 MED ORDER — METHOCARBAMOL 100 MG/ML IJ SOLN
750.0000 mg | Freq: Four times a day (QID) | INTRAVENOUS | Status: DC | PRN
  Administered 2013-08-26 – 2013-08-27 (×3): 750 mg via INTRAVENOUS
  Filled 2013-08-26 (×4): qty 7.5

## 2013-08-26 MED ORDER — HYDROMORPHONE HCL PF 1 MG/ML IJ SOLN
0.5000 mg | INTRAMUSCULAR | Status: DC | PRN
  Administered 2013-08-26 – 2013-08-29 (×13): 1 mg via INTRAVENOUS
  Filled 2013-08-26 (×13): qty 1

## 2013-08-26 NOTE — Progress Notes (Signed)
Patient has been out of bed to chair since lunch. Has also ambulated to bathroom. Pain is rated at zero. Joslyn Hy, MSN, RN, [employer]

## 2013-08-26 NOTE — Progress Notes (Signed)
2 Days Post-Op  Subjective: C/o a lot of pain when moving around, but still managed to walk yesterday.  Tolerating clears, no N/V.  No flatus or BM yet.  Feels stomach rumbling some.  Husband at bedside.  Objective: Vital signs in last 24 hours: Temp:  [98 F (36.7 C)-99.8 F (37.7 C)] 98.9 F (37.2 C) (03/07 0400) Pulse Rate:  [60-86] 80 (03/07 0400) Resp:  [18-25] 23 (03/07 0727) BP: (109-147)/(68-81) 114/73 mmHg (03/07 0400) SpO2:  [96 %-100 %] 99 % (03/07 0727) Last BM Date: 08/24/13  Intake/Output from previous day: 03/06 0701 - 03/07 0700 In: 2738.8 [P.O.:1830; I.V.:908.8] Out: 462 [Urine:352; Drains:110] Intake/Output this shift:    PE: Gen:  Alert, NAD, pleasant Pulm:  IS at 1600 Abd: Soft, mild distension, mild tenderness in lower abdomen, diminished BS, no HSM, incisions C/D/I with gauze/tape dressing in place across the entire lower abdomen and LLQ, drain in LLQ with serosanguinous drainage (11mL/24 hours)   Lab Results:   Recent Labs  08/25/13 0542  WBC 9.5  HGB 12.2  HCT 35.9*  PLT 258   BMET  Recent Labs  08/25/13 0542  NA 139  K 3.7  CL 103  CO2 21  GLUCOSE 101*  BUN 7  CREATININE 0.70  CALCIUM 8.3*   PT/INR No results found for this basename: LABPROT, INR,  in the last 72 hours CMP     Component Value Date/Time   NA 139 08/25/2013 0542   K 3.7 08/25/2013 0542   CL 103 08/25/2013 0542   CO2 21 08/25/2013 0542   GLUCOSE 101* 08/25/2013 0542   BUN 7 08/25/2013 0542   CREATININE 0.70 08/25/2013 0542   CALCIUM 8.3* 08/25/2013 0542   PROT 6.8 08/21/2013 0603   ALBUMIN 3.2* 08/21/2013 0603   AST 9 08/21/2013 0603   ALT 10 08/21/2013 0603   ALKPHOS 88 08/21/2013 0603   BILITOT 0.3 08/21/2013 0603   GFRNONAA >90 08/25/2013 0542   GFRAA >90 08/25/2013 0542   Lipase     Component Value Date/Time   LIPASE 49 08/20/2013 0550       Studies/Results: No results found.  Anti-infectives: Anti-infectives   Start     Dose/Rate Route Frequency Ordered Stop   08/24/13 1230  ciprofloxacin (CIPRO) IVPB 400 mg  Status:  Discontinued     400 mg 200 mL/hr over 60 Minutes Intravenous Every 12 hours 08/24/13 1220 08/24/13 1703       Assessment/Plan 1. POD #2 s/p Ventral hernia repair with mesh & panniculectomy (Dr. Excell Seltzer 08/24/13) 2. CT shows small umbilical hernia and midline lower abdominal hernia, unremarkable abdominal US, contrast into colon 08/20/13 with no evidence of obstruction. S/p abdominal hysterectomy 2006.  3. EGD yesterday was normal, colonoscopy shows normal mucosa, tranverse colon polypectomy performed, moderate diverticulosis with normal colon mucosa.  4. Body mass index is 39.1  5. AODM  6. Hypertension.   Plan:  1. IVF, d/c PCA, start IV push dilaudid, antiemetics  2. Continue clears and await for bowel function  3. Ambulate and IS (at 1600)  4. SCD's and lovenox  5. Hopefully advance diet tomorrow  6. Remove dressing tomorrow am.   LOS: 7 days    DORT, MEGAN 08/26/2013, 7:55 AM Pager: [phone]  Agree with above. God daughter in room with patient. Needs to ambulate more.  Alphonsa Overall, MD, [facility] Pager: [phone] Office phone:  [phone]

## 2013-08-27 LAB — GLUCOSE, CAPILLARY
Glucose-Capillary: 121 mg/dL — ABNORMAL HIGH (ref 70–99)
Glucose-Capillary: 128 mg/dL — ABNORMAL HIGH (ref 70–99)
Glucose-Capillary: 132 mg/dL — ABNORMAL HIGH (ref 70–99)
Glucose-Capillary: 154 mg/dL — ABNORMAL HIGH (ref 70–99)
Glucose-Capillary: 164 mg/dL — ABNORMAL HIGH (ref 70–99)

## 2013-08-27 LAB — CBC
HCT: 34.1 % — ABNORMAL LOW (ref 36.0–46.0)
Hemoglobin: 11.6 g/dL — ABNORMAL LOW (ref 12.0–15.0)
MCH: 29.1 pg (ref 26.0–34.0)
MCHC: 34 g/dL (ref 30.0–36.0)
MCV: 85.7 fL (ref 78.0–100.0)
Platelets: 265 10*3/uL (ref 150–400)
RBC: 3.98 MIL/uL (ref 3.87–5.11)
RDW: 14.9 % (ref 11.5–15.5)
WBC: 10.6 10*3/uL — ABNORMAL HIGH (ref 4.0–10.5)

## 2013-08-27 LAB — BASIC METABOLIC PANEL
BUN: 4 mg/dL — ABNORMAL LOW (ref 6–23)
CO2: 25 mEq/L (ref 19–32)
Calcium: 8.7 mg/dL (ref 8.4–10.5)
Chloride: 96 mEq/L (ref 96–112)
Creatinine, Ser: 0.73 mg/dL (ref 0.50–1.10)
GFR calc Af Amer: >90 mL/min (ref >90)
GFR calc non Af Amer: >90 mL/min (ref >90)
Glucose, Bld: 123 mg/dL — ABNORMAL HIGH (ref 70–99)
Potassium: 3.5 mEq/L — ABNORMAL LOW (ref 3.7–5.3)
Sodium: 134 mEq/L — ABNORMAL LOW (ref 137–147)

## 2013-08-27 MED ORDER — LACTATED RINGERS IV SOLN
INTRAVENOUS | Status: DC
  Administered 2013-08-27 – 2013-08-28 (×2): via INTRAVENOUS
  Administered 2013-08-29: 10 mL/h via INTRAVENOUS

## 2013-08-27 MED ORDER — ENSURE COMPLETE PO LIQD
237.0000 mL | Freq: Three times a day (TID) | ORAL | Status: DC
  Administered 2013-08-27 – 2013-08-29 (×7): 237 mL via ORAL

## 2013-08-27 MED ORDER — BISACODYL 10 MG RE SUPP
10.0000 mg | Freq: Every day | RECTAL | Status: DC | PRN
  Administered 2013-08-29: 10 mg via RECTAL
  Filled 2013-08-27: qty 1

## 2013-08-27 NOTE — Progress Notes (Signed)
3 Days Post-Op  Subjective: Pt doing much better today.  Able to ambulate more yesterday.  No N/V, abdominal pain much improved.  No flatus yet, but stomach is rumbling.  No BM.  Tolerating clears well  Objective: Vital signs in last 24 hours: Temp:  [98.6 F (37 C)-100.6 F (38.1 C)] 99 F (37.2 C) (03/08 0343) Pulse Rate:  [81-104] 92 (03/08 0343) Resp:  [18-22] 18 (03/08 0343) BP: (111-144)/(73-89) 119/77 mmHg (03/08 0343) SpO2:  [93 %-99 %] 93 % (03/08 0343) Last BM Date: 08/24/13  Intake/Output from previous day: 03/07 0701 - 03/08 0700 In: 2381.3 [P.O.:240; I.V.:1956.3; IV Piggyback:115] Out: 20 [Drains:20] Intake/Output this shift:    PE: Gen:  Alert, NAD, pleasant Abd: Soft, mild tenderness, ND, +BS, no HSM, lower abdominal dressing removed and staples in place, clean and dry without drainage, JP drain in LLQ is serosanguinous with 140mL/24 hours (improving)   Lab Results:   Recent Labs  08/25/13 0542 08/27/13 0605  WBC 9.5 10.6*  HGB 12.2 11.6*  HCT 35.9* 34.1*  PLT 258 265   BMET  Recent Labs  08/25/13 0542 08/27/13 0605  NA 139 134*  K 3.7 3.5*  CL 103 96  CO2 21 25  GLUCOSE 101* 123*  BUN 7 4*  CREATININE 0.70 0.73  CALCIUM 8.3* 8.7   PT/INR No results found for this basename: LABPROT, INR,  in the last 72 hours CMP     Component Value Date/Time   NA 134* 08/27/2013 0605   K 3.5* 08/27/2013 0605   CL 96 08/27/2013 0605   CO2 25 08/27/2013 0605   GLUCOSE 123* 08/27/2013 0605   BUN 4* 08/27/2013 0605   CREATININE 0.73 08/27/2013 0605   CALCIUM 8.7 08/27/2013 0605   PROT 6.8 08/21/2013 0603   ALBUMIN 3.2* 08/21/2013 0603   AST 9 08/21/2013 0603   ALT 10 08/21/2013 0603   ALKPHOS 88 08/21/2013 0603   BILITOT 0.3 08/21/2013 0603   GFRNONAA >90 08/27/2013 0605   GFRAA >90 08/27/2013 0605   Lipase     Component Value Date/Time   LIPASE 49 08/20/2013 0550       Studies/Results: No results found.  Anti-infectives: Anti-infectives   Start     Dose/Rate Route  Frequency Ordered Stop   08/24/13 1230  ciprofloxacin (CIPRO) IVPB 400 mg  Status:  Discontinued     400 mg 200 mL/hr over 60 Minutes Intravenous Every 12 hours 08/24/13 1220 08/24/13 1703       Assessment/Plan 1. POD #3 s/p Ventral hernia repair with mesh & panniculectomy (Dr. Excell Seltzer 08/24/13)  2. CT shows small umbilical hernia and midline lower abdominal hernia, unremarkable abdominal US, contrast into colon 08/20/13 with no evidence of obstruction. S/p abdominal hysterectomy 2006.  3. EGD yesterday was normal, colonoscopy shows normal mucosa, tranverse colon polypectomy performed, moderate diverticulosis with normal colon mucosa.  4. Body mass index is 39.1  5. AODM  6. Hypertension.   Plan:  1. IVF - switch to LR due to NS shortage, PO and IV prn pain meds 2. Continue clears and await for bowel function, add ensure 3. Ambulate and IS (at 1750)   She has already walked in the hall once.  I have encouraged her to get out a couple of more times. 4. SCD's and lovenox  5. Hopefully advance diet soon 6. Daily dressing change to JP drain site, staples to be removed between POD #10-14   LOS: 8 days   [name], Renee 08/27/2013, 7:51  AM Pager: [phone]  Agree with above. Still with ileus.  Will leave on clear liquids for now. Husband, [name], in room.  Alphonsa Overall, MD, [facility] Pager: [phone] Office phone:  [phone]

## 2013-08-27 NOTE — Progress Notes (Signed)
Pt ambulated about 400 feet using walker with assistance of nurse tech . Pt informed RN that she had experienced some flatulence after her walk.

## 2013-08-28 LAB — GLUCOSE, CAPILLARY
Glucose-Capillary: 125 mg/dL — ABNORMAL HIGH (ref 70–99)
Glucose-Capillary: 131 mg/dL — ABNORMAL HIGH (ref 70–99)
Glucose-Capillary: 166 mg/dL — ABNORMAL HIGH (ref 70–99)
Glucose-Capillary: 167 mg/dL — ABNORMAL HIGH (ref 70–99)
Glucose-Capillary: 195 mg/dL — ABNORMAL HIGH (ref 70–99)
Glucose-Capillary: 214 mg/dL — ABNORMAL HIGH (ref 70–99)

## 2013-08-28 NOTE — Progress Notes (Signed)
4 Days Post-Op  Subjective: Pt doing well.  Pain much improved and well tolerated.  Ambulating more through the halls.  JP drain getting lighter.  Tolerating clears and ensure.  Had one gas bubble pass.    Objective: Vital signs in last 24 hours: Temp:  [97.3 F (36.3 C)-99.3 F (37.4 C)] 99.1 F (37.3 C) (03/09 0531) Pulse Rate:  [80-98] 84 (03/09 0531) Resp:  [19-20] 19 (03/09 0531) BP: (117-132)/(72-79) 117/78 mmHg (03/09 0531) SpO2:  [95 %-99 %] 95 % (03/09 0531) Last BM Date: 08/24/13  Intake/Output from previous day: 03/08 0701 - 03/09 0700 In: 1060 [P.O.:960] Out: -  Intake/Output this shift:    PE: Gen:  Alert, NAD, pleasant Abd: Soft, mild tenderness, ND, +BS, no HSM, lower abdominal staples in place, clean and dry without drainage, JP drain in LLQ is serosanguinous with 41mL/24 hours (much improved)   Lab Results:   Recent Labs  08/27/13 0605  WBC 10.6*  HGB 11.6*  HCT 34.1*  PLT 265   BMET  Recent Labs  08/27/13 0605  NA 134*  K 3.5*  CL 96  CO2 25  GLUCOSE 123*  BUN 4*  CREATININE 0.73  CALCIUM 8.7   PT/INR No results found for this basename: LABPROT, INR,  in the last 72 hours CMP     Component Value Date/Time   NA 134* 08/27/2013 0605   K 3.5* 08/27/2013 0605   CL 96 08/27/2013 0605   CO2 25 08/27/2013 0605   GLUCOSE 123* 08/27/2013 0605   BUN 4* 08/27/2013 0605   CREATININE 0.73 08/27/2013 0605   CALCIUM 8.7 08/27/2013 0605   PROT 6.8 08/21/2013 0603   ALBUMIN 3.2* 08/21/2013 0603   AST 9 08/21/2013 0603   ALT 10 08/21/2013 0603   ALKPHOS 88 08/21/2013 0603   BILITOT 0.3 08/21/2013 0603   GFRNONAA >90 08/27/2013 0605   GFRAA >90 08/27/2013 0605   Lipase     Component Value Date/Time   LIPASE 49 08/20/2013 0550       Studies/Results: No results found.  Anti-infectives: Anti-infectives   Start     Dose/Rate Route Frequency Ordered Stop   08/24/13 1230  ciprofloxacin (CIPRO) IVPB 400 mg  Status:  Discontinued     400 mg 200 mL/hr over 60 Minutes  Intravenous Every 12 hours 08/24/13 1220 08/24/13 1703       Assessment/Plan 1. POD #4 s/p Ventral hernia repair with mesh & panniculectomy (Dr. Excell Seltzer 08/24/13)  2. CT shows small umbilical hernia and midline lower abdominal hernia, unremarkable abdominal US, contrast into colon 08/20/13 with no evidence of obstruction. S/p abdominal hysterectomy 2006.  3. EGD yesterday was normal, colonoscopy shows normal mucosa, tranverse colon polypectomy performed, moderate diverticulosis with normal colon mucosa.  4. Body mass index is 39.1  5. AODM  6. Hypertension  Plan:  1. IVF - switch to LR due to NS shortage, PO and IV prn pain meds  2. Continue clears and await for bowel function, add ensure; discussed with nursing to advance if she passes more gas today (only passed 1 so far) 3. Ambulate and IS (at 1750)  4. SCD's and lovenox  5. Daily dressing change to JP drain site, staples to be removed between POD #10-14  Disp:  Hopefully home in a few days    LOS: 9 days    DORT, Gunther Zawadzki 08/28/2013, 8:33 AM Pager: [phone]

## 2013-08-28 NOTE — Progress Notes (Signed)
I have seen and examined the patient and agree with the assessment and plans.  Akin Yi A. Mikhala Kenan  MD, FACS  

## 2013-08-28 NOTE — Progress Notes (Signed)
INITIAL NUTRITION ASSESSMENT  DOCUMENTATION CODES Per approved criteria  -Not Applicable   INTERVENTION: Please obtain current weight on patient. Diet advancement per team, pt has been on Clear Liquids x 3 days. Continue Ensure Complete po TID. Recommend MVI daily, once diet advanced beyond Clear Liquids to help maximize healing. RD to continue to follow nutrition care plan.  NUTRITION DIAGNOSIS: Increased nutrient needs related to post-op healing as evidenced by estimated needs.   Goal: Further diet advancement as tolerated. Intake to meet >90% of estimated nutrition needs.  Monitor:  weight trends, lab trends, I/O's, PO intake, supplement tolerance, further diet advancement  Reason for Assessment: Malnutrition Screening Tool  58 y.o. female  Admitting Dx: hernias  ASSESSMENT: PMHx significant for DM, HTN, obestiy, hysterectomy. Admitted with abdominal pain x 2 weeks with "soft stools". Work-up reveals mild pancreatitis and 2 lower midline ventral incisional hernias.  GI evaluated pt and completed EGD/colonoscopy on 3/3 to r/o other causes before surgery on hernias. These revealed no etiology of abdominal pain.  Pt underwent herniorrhaphy with insertion of mesh and panniculectomy on 3/5.  Currently ordered for Clear Liquids, as of 3/6. Surgery ordered pt Ensure Complete TID yesterday. Pt reports that she doesn't like the Clear Liquids and feels as though she is ready for diet advancement. Confirms that she is able to consume the Ensure although she states that it hurts her stomach. She states that her weight has been stable PTA and hopes that she loses weight after her surgery. Discussed need for adequate nutrition to promote healing. Pt verbalized understanding.  Sodium low at 134 Potassium low at 3.5 - ordered for Lactated Ringers at 50 ml/hr CBG's: 195, 166, 131 Meds of note: protonix, Ensure PO TID  Height: Ht Readings from Last 1 Encounters:  08/19/13 5' 2.4" (1.585  m)    Weight: Wt Readings from Last 1 Encounters:  08/19/13 217 lb (98.431 kg)    Ideal Body Weight: 112 lb/50.9 kg  % Ideal Body Weight: 194%  Wt Readings from Last 10 Encounters:  08/19/13 217 lb (98.431 kg)  08/19/13 217 lb (98.431 kg)  08/19/13 217 lb (98.431 kg)    Usual Body Weight: pt unable to state  % Usual Body Weight: n/a  BMI:  Body mass index is 39.18 kg/(m^2). Obese Class II  Estimated Nutritional Needs: Kcal: 1800 - 2000 Protein: 75 - 90 g Fluid: 1.8 - 2 liters  Skin: abdomen incision  Diet Order: Clear Liquid  EDUCATION NEEDS: -No education needs identified at this time   Intake/Output Summary (Last 24 hours) at 08/28/13 1104 Last data filed at 08/28/13 1006  Gross per 24 hour  Intake   1180 ml  Output      0 ml  Net   1180 ml    Last BM: 3/9  Labs:   Recent Labs Lab 08/25/13 0542 08/27/13 0605  NA 139 134*  K 3.7 3.5*  CL 103 96  CO2 21 25  BUN 7 4*  CREATININE 0.70 0.73  CALCIUM 8.3* 8.7  GLUCOSE 101* 123*    CBG (last 3)   Recent Labs  08/28/13 0025 08/28/13 0404 08/28/13 0739  GLUCAP 195* 166* 131*    Scheduled Meds: . enoxaparin (LOVENOX) injection  40 mg Subcutaneous Q24H  . feeding supplement (ENSURE COMPLETE)  237 mL Oral TID WC  . insulin aspart  0-9 Units Subcutaneous 6 times per day  . latanoprost  1 drop Both Eyes QHS  . pantoprazole  40 mg Oral Q0600  Continuous Infusions: . lactated ringers 50 mL/hr at 08/28/13 0536    Past Medical History  Diagnosis Date  . Hypertension   . Diabetes mellitus without complication   . Anemia 2006    required transfusion post TAH/BSO 05/2005  . GLA deficiency   . Glaucoma of both eyes     Past Surgical History  Procedure Laterality Date  . Total abdominal hysterectomy w/ bilateral salpingoophorectomy  05/2005  . Hysteroscopy w/d&c  01/2005    for uterine fibroids.   . Esophagogastroduodenoscopy N/A 08/22/2013    Procedure: ESOPHAGOGASTRODUODENOSCOPY (EGD);   Surgeon: Irene Shipper, MD;  Location: [facility] ENDOSCOPY;  Service: Endoscopy;  Laterality: N/A;  . Colonoscopy N/A 08/22/2013    Procedure: COLONOSCOPY;  Surgeon: Irene Shipper, MD;  Location: [place];  Service: Endoscopy;  Laterality: N/A;  . Ventral hernia repair N/A 08/24/2013    Procedure: HERNIA REPAIR VENTRAL ADULT;  Surgeon: Edward Jolly, MD;  Location: [place];  Service: General;  Laterality: N/A;  . Insertion of mesh N/A 08/24/2013    Procedure: INSERTION OF MESH;  Surgeon: Edward Jolly, MD;  Location: [place];  Service: General;  Laterality: N/A;  . Panniculectomy N/A 08/24/2013    Procedure: PANNICULECTOMY;  Surgeon: Edward Jolly, MD;  Location: [place];  Service: General;  Laterality: N/A;    Inda Coke MS, RD, LDN Inpatient Registered Dietitian Pager: [phone] After-hours pager: [phone]

## 2013-08-29 LAB — GLUCOSE, CAPILLARY
Glucose-Capillary: 134 mg/dL — ABNORMAL HIGH (ref 70–99)
Glucose-Capillary: 137 mg/dL — ABNORMAL HIGH (ref 70–99)
Glucose-Capillary: 158 mg/dL — ABNORMAL HIGH (ref 70–99)
Glucose-Capillary: 203 mg/dL — ABNORMAL HIGH (ref 70–99)

## 2013-08-29 MED ORDER — HYDROCODONE-ACETAMINOPHEN 5-325 MG PO TABS: 1.0000 | ORAL_TABLET | ORAL | Status: DC | PRN

## 2013-08-29 MED ORDER — DOCUSATE SODIUM 100 MG PO CAPS
100.0000 mg | ORAL_CAPSULE | Freq: Every day | ORAL | Status: DC
  Administered 2013-08-29: 100 mg via ORAL
  Filled 2013-08-29: qty 1

## 2013-08-29 MED ORDER — METHOCARBAMOL 750 MG PO TABS: 750.0000 mg | ORAL_TABLET | Freq: Four times a day (QID) | ORAL | Status: DC | PRN

## 2013-08-29 MED ORDER — METHOCARBAMOL 750 MG PO TABS
750.0000 mg | ORAL_TABLET | Freq: Four times a day (QID) | ORAL | Status: DC | PRN
  Filled 2013-08-29: qty 1

## 2013-08-29 MED ORDER — DSS 100 MG PO CAPS: 100.0000 mg | ORAL_CAPSULE | Freq: Every day | ORAL | Status: DC

## 2013-08-29 MED ORDER — HYDROCODONE-ACETAMINOPHEN 5-325 MG PO TABS: 1.0000 | ORAL_TABLET | Freq: Four times a day (QID) | ORAL | Status: DC | PRN

## 2013-08-29 MED ORDER — BISACODYL 10 MG RE SUPP: 10.0000 mg | Freq: Every day | RECTAL | Status: DC | PRN

## 2013-08-29 NOTE — Discharge Summary (Signed)
I have seen and examined the patient and agree with the assessment and plans.  Zeniya Lapidus A. Kenzlei Runions  MD, FACS  

## 2013-08-29 NOTE — Discharge Summary (Signed)
Physician Discharge Summary  Patient ID: [name] MRN: [id number] DOB/AGE: 1956-06-17 58 y.o.  Admit date: 08/19/2013 Discharge date: 08/29/2013  Admitting Diagnosis: Ventral hernia Abdominal pain  Discharge Diagnosis Patient Active Problem List   Diagnosis Date Noted  . Benign neoplasm of colon 08/22/2013  . Special screening for malignant neoplasms, colon 08/22/2013  . Abdominal pain 08/19/2013  . Diabetes mellitus 08/19/2013  . Essential hypertension, benign 08/19/2013  . Ventral hernia 08/19/2013    Consultants Dr. Henrene Pastor (Gastroenterology) Dr. Broadus John & Dr. Algis Liming (Internal Medicine)  Imaging: No results found.  Procedures Dr. Excell Seltzer (08/24/13) - Ventral hernia repair with mesh, panniculectomy  Hospital Course:  58 y/o female with HTN, DM, and morbid obesity that presented to [facility] with worsening epigastric abdominal pain, nausea, and vomiting on 08/19/13. The patient has noticed that she has had progressive worsening of "indigestion" over the last few months, with post-prandial bloating, mild abdominal pain, and nausea. This is worse with pasta and spicy foods. She had a total abdominal hysterectomy and LSO in 2006 by Dr. Raphael Gibney. Over the last year or so, she has noticed that she develops pain above her bladder when she sneezes or coughs.   Workup was inclusive.  She was admitted for further evaluation and workup.  An EGD and colonoscopy was performed was normal.  Colon polypectomy performed and found moderate diverticulosis with normal colon mucosa.  Ventral incisional hernia was seen on exam which was tender.  Dr. Excell Seltzer decided to offer her a ventral incisional hernia repair with a transverse incision and panniculectomy for better access.  Patient underwent procedure listed above, tolerated the procedure and was transferred to the floor.  She experienced a post-operative ileus.  She was started on clears on POD #1.  Her bowel function started to return on POD #4  and diet was advanced as tolerated.  Her JP drain initially put out On POD #5/HD #11, the patient was voiding well, tolerating diet, ambulating well, pain well controlled, vital signs stable, incisions c/d/i and felt stable for discharge home.  Patient will follow up in our office for staple removal at POD #14 and a post-op check in 2-3 weeks and knows to call with questions or concerns.  She will go home with her JP drain.  It is starting to reduce in the amount of drainage - 149mL but has dropped back to 58mL/24 hours.  This will likely need to stay in for several weeks secondary to a high risk after panniculectomy for re-accumulation of fluid and to prevent wound infections.  She can continue with her abdominal binder for comfort.  Recommended bowel regimen for prevention of constipation.   Physical Exam: General:  Alert, NAD, pleasant, comfortable Abd: Soft, mild tenderness, ND, +BS, no HSM, lower abdominal staples in place, clean and dry without drainage, JP drain in LLQ is serosanguinous with 36mL/24 hours     Medication List         bimatoprost 0.03 % ophthalmic solution  Commonly known as:  LUMIGAN  Place 1 drop into both eyes at bedtime.     bisacodyl 10 MG suppository  Commonly known as:  DULCOLAX  Place 1 suppository (10 mg total) rectally daily as needed for mild constipation or moderate constipation.     DSS 100 MG Caps  Take 100 mg by mouth daily.     glimepiride 4 MG tablet  Commonly known as:  AMARYL  Take 4 mg by mouth daily with breakfast.     HYDROcodone-acetaminophen 5-325  MG per tablet  Commonly known as:  NORCO/VICODIN  Take 1-2 tablets by mouth every 6 (six) hours as needed for moderate pain.     losartan 25 MG tablet  Commonly known as:  COZAAR  Take 25 mg by mouth daily.     metFORMIN 500 MG tablet  Commonly known as:  GLUCOPHAGE  Take 500 mg by mouth 2 (two) times daily with a meal.     methocarbamol 750 MG tablet  Commonly known as:  ROBAXIN  Take 1  tablet (750 mg total) by mouth every 6 (six) hours as needed for muscle spasms.     pseudoephedrine-guaifenesin 60-600 MG per tablet  Commonly known as:  MUCINEX D  Take 1 tablet by mouth every 12 (twelve) hours.     traMADol 50 MG tablet  Commonly known as:  ULTRAM  Take 50 mg by mouth every 6 (six) hours as needed (pain).         Follow-up Information   Follow up with CCS,MD, MD On 09/07/2013. (For post-operation check for staple removal with Dr. Lear Ng nurse)    Specialty:  General Surgery      Follow up with Edward Jolly, MD In 2 weeks. (For post-operation check in 2-3 weeks with Dr. Excell Seltzer)    Specialty:  General Surgery   Contact information:   [place] [address] [phone]       Signed: Excell Seltzer [facility] Surgery [phone]  08/29/2013, 8:02 AM

## 2013-08-29 NOTE — Progress Notes (Signed)
Nsg Discharge Note  Admit Date:  08/19/2013 Discharge date: 08/29/2013   [name] to be D/C'd Home per MD order.  AVS completed.  Copy for chart, and copy for patient signed, and dated. Patient/caregiver able to verbalize understanding.  Discharge Medication:   Medication List         bimatoprost 0.03 % ophthalmic solution  Commonly known as:  LUMIGAN  Place 1 drop into both eyes at bedtime.     bisacodyl 10 MG suppository  Commonly known as:  DULCOLAX  Place 1 suppository (10 mg total) rectally daily as needed for mild constipation or moderate constipation.     DSS 100 MG Caps  Take 100 mg by mouth daily.     glimepiride 4 MG tablet  Commonly known as:  AMARYL  Take 4 mg by mouth daily with breakfast.     HYDROcodone-acetaminophen 5-325 MG per tablet  Commonly known as:  NORCO/VICODIN  Take 1-2 tablets by mouth every 6 (six) hours as needed for moderate pain.     losartan 25 MG tablet  Commonly known as:  COZAAR  Take 25 mg by mouth daily.     metFORMIN 500 MG tablet  Commonly known as:  GLUCOPHAGE  Take 500 mg by mouth 2 (two) times daily with a meal.     methocarbamol 750 MG tablet  Commonly known as:  ROBAXIN  Take 1 tablet (750 mg total) by mouth every 6 (six) hours as needed for muscle spasms.     pseudoephedrine-guaifenesin 60-600 MG per tablet  Commonly known as:  MUCINEX D  Take 1 tablet by mouth every 12 (twelve) hours.     traMADol 50 MG tablet  Commonly known as:  ULTRAM  Take 50 mg by mouth every 6 (six) hours as needed (pain).        Discharge Assessment: Filed Vitals:   08/29/13 0445  BP: 126/80  Pulse: 72  Temp: 98.5 F (36.9 C)  Resp: 20   Skin clean, dry and intact without evidence of skin break down, no evidence of skin tears noted. JP drain intact.  IV catheter discontinued intact. Site without signs and symptoms of complications - no redness or edema noted at insertion site, patient denies c/o pain - only slight tenderness at  site.  Dressing with slight pressure applied.  D/c Instructions-Education: Discharge instructions given to patient/family with verbalized understanding. D/c education completed with patient/family including follow up instructions, medication list, d/c activities limitations if indicated, with other d/c instructions as indicated by MD - patient able to verbalize understanding, all questions fully answered. Patient instructed to return to ED, call 911, or call MD for any changes in condition.  Patient escorted via [place], and D/C home via private auto.  Dayle Points, RN 08/29/2013 2:30 PM

## 2013-09-06 ENCOUNTER — Ambulatory Visit (INDEPENDENT_AMBULATORY_CARE_PROVIDER_SITE_OTHER): Payer: BC Managed Care – PPO | Admitting: General Surgery

## 2013-09-06 ENCOUNTER — Encounter (INDEPENDENT_AMBULATORY_CARE_PROVIDER_SITE_OTHER): Payer: Self-pay | Admitting: General Surgery

## 2013-09-06 VITALS — BP 116/77 | HR 82 | Temp 97.7°F | Resp 18

## 2013-09-06 DIAGNOSIS — Z4802 Encounter for removal of sutures: Secondary | ICD-10-CM

## 2013-09-06 NOTE — Patient Instructions (Signed)
Patient came in today for drain and staple removal and the drain was the last 48 hours was coming out was 1 to 1/2 cc, so I pulled out the drain and placed a dry gauze over the wound where the drain was. And then I removed all the staple from her surgery site. The surgery site wound was healed and I placed steri streps over the wound and I told her that she can shower and when she gets out of the shower that she can pat dry the area. And the steri streps can come off next Wednesday and keep her apt with Dr Excell Seltzer

## 2013-09-14 ENCOUNTER — Encounter (INDEPENDENT_AMBULATORY_CARE_PROVIDER_SITE_OTHER): Payer: Self-pay

## 2013-09-14 ENCOUNTER — Encounter (INDEPENDENT_AMBULATORY_CARE_PROVIDER_SITE_OTHER): Payer: Self-pay | Admitting: General Surgery

## 2013-09-14 ENCOUNTER — Ambulatory Visit (INDEPENDENT_AMBULATORY_CARE_PROVIDER_SITE_OTHER): Payer: BC Managed Care – PPO | Admitting: General Surgery

## 2013-09-14 ENCOUNTER — Telehealth (INDEPENDENT_AMBULATORY_CARE_PROVIDER_SITE_OTHER): Payer: Self-pay

## 2013-09-14 VITALS — BP 128/78 | HR 66 | Temp 97.0°F | Resp 16 | Ht 62.0 in | Wt 204.2 lb

## 2013-09-14 DIAGNOSIS — Z09 Encounter for follow-up examination after completed treatment for conditions other than malignant neoplasm: Secondary | ICD-10-CM

## 2013-09-14 NOTE — Telephone Encounter (Signed)
RTW note mailed per patient's request to address noted in EPIC.

## 2013-09-14 NOTE — Progress Notes (Signed)
Chief complaint: Followup panniculectomy with hernia repair  History: Patient returns for followup 3 weeks following repair of ventral incisional hernia with panniculectomy. She was admitted acutely to the hospital with abdominal pain prior to her surgery. Her staples were removed last week. She reports she is feeling steadily better. She states that her preoperative pain has been completely relieved. She just has improving soreness. No GI complaints.  Exam: BP 128/78  Pulse 66  Temp(Src) 97 F (36.1 C) (Temporal)  Resp 16  Ht 5\' 2"  (1.575 m)  Wt 204 lb 3.2 oz (92.625 kg)  BMI 37.34 kg/m2 General: Appears well Abdomen: Incision very nicely healed without complication. Soft and nontender. Hernia repair feels solid.  Assessment and plan: Doing very well postop. We discussed activity limitations for the next month. I will see her in one month for what should be a final check

## 2013-09-14 NOTE — Patient Instructions (Signed)
No lifting over 10 pounds until your next appointment in 4 weeks

## 2013-09-14 NOTE — Telephone Encounter (Signed)
Message copied by Ivor Costa on Thu Sep 14, 2013  4:32 PM ------      Message from: Salvatore Marvel      Created: Thu Sep 14, 2013 10:14 AM      Regarding: Dr. Alric Quan work note      Contact: [phone]       Patient had repair ventral hernia on 08/24/13 with Dr. Excell Seltzer and needs a return work note states she can start on 10/02/13 with lifting restrictions, please could you mail to her.            Thank you. ------

## 2013-10-12 ENCOUNTER — Encounter (INDEPENDENT_AMBULATORY_CARE_PROVIDER_SITE_OTHER): Payer: BC Managed Care – PPO | Admitting: General Surgery

## 2013-10-27 ENCOUNTER — Ambulatory Visit (INDEPENDENT_AMBULATORY_CARE_PROVIDER_SITE_OTHER): Payer: BC Managed Care – PPO | Admitting: General Surgery

## 2013-10-27 ENCOUNTER — Encounter (INDEPENDENT_AMBULATORY_CARE_PROVIDER_SITE_OTHER): Payer: Self-pay | Admitting: General Surgery

## 2013-10-27 VITALS — BP 118/80 | HR 77 | Temp 97.5°F | Ht 62.0 in | Wt 205.6 lb

## 2013-10-27 DIAGNOSIS — Z09 Encounter for follow-up examination after completed treatment for conditions other than malignant neoplasm: Secondary | ICD-10-CM

## 2013-10-27 NOTE — Progress Notes (Signed)
History: The patient returns 1 month following ventral hernia repair with panniculectomy. She is getting along well. No further pain. Getting back to normal activity although she is anticipating hip replacement surgery.  Exam: incision is very nicely healed. Abdomen soft and nontender. No evidence of hernia.  Assessment and plan: Doing very well following hernia repair with panniculectomy. She can return to full activity in June. Return here as needed.

## 2013-11-17 ENCOUNTER — Other Ambulatory Visit (HOSPITAL_COMMUNITY): Payer: Self-pay | Admitting: Orthopaedic Surgery

## 2013-11-30 ENCOUNTER — Encounter (HOSPITAL_COMMUNITY): Payer: Self-pay | Admitting: Pharmacy Technician

## 2013-12-04 ENCOUNTER — Other Ambulatory Visit: Payer: Self-pay

## 2013-12-04 ENCOUNTER — Encounter (HOSPITAL_COMMUNITY): Payer: Self-pay

## 2013-12-04 ENCOUNTER — Encounter (HOSPITAL_COMMUNITY)
Admission: RE | Admit: 2013-12-04 | Discharge: 2013-12-04 | Disposition: A | Discharge status: Home / Self Care | Payer: BC Managed Care – PPO | Source: Ambulatory Visit | Attending: Surgery | Admitting: Surgery

## 2013-12-04 DIAGNOSIS — Z01810 Encounter for preprocedural cardiovascular examination: Secondary | ICD-10-CM | POA: Diagnosis not present

## 2013-12-04 DIAGNOSIS — Z01818 Encounter for other preprocedural examination: Secondary | ICD-10-CM | POA: Diagnosis present

## 2013-12-04 DIAGNOSIS — Z01812 Encounter for preprocedural laboratory examination: Secondary | ICD-10-CM | POA: Insufficient documentation

## 2013-12-04 LAB — CBC
HCT: 37.6 % (ref 36.0–46.0)
Hemoglobin: 12.1 g/dL (ref 12.0–15.0)
MCH: 28.1 pg (ref 26.0–34.0)
MCHC: 32.2 g/dL (ref 30.0–36.0)
MCV: 87.4 fL (ref 78.0–100.0)
Platelets: 304 10*3/uL (ref 150–400)
RBC: 4.3 MIL/uL (ref 3.87–5.11)
RDW: 14.4 % (ref 11.5–15.5)
WBC: 7.5 10*3/uL (ref 4.0–10.5)

## 2013-12-04 LAB — URINALYSIS, ROUTINE W REFLEX MICROSCOPIC
Bilirubin Urine: NEGATIVE
Glucose, UA: NEGATIVE mg/dL
Hgb urine dipstick: NEGATIVE
Ketones, ur: NEGATIVE mg/dL
Leukocytes, UA: NEGATIVE
Nitrite: NEGATIVE
Protein, ur: NEGATIVE mg/dL
Specific Gravity, Urine: 1.023 (ref 1.005–1.030)
Urobilinogen, UA: 1 mg/dL (ref 0.0–1.0)
pH: 6.5 (ref 5.0–8.0)

## 2013-12-04 LAB — ABO/RH: ABO/RH(D): O POS

## 2013-12-04 LAB — BASIC METABOLIC PANEL
BUN: 14 mg/dL (ref 6–23)
CO2: 28 mEq/L (ref 19–32)
Calcium: 9.8 mg/dL (ref 8.4–10.5)
Chloride: 101 mEq/L (ref 96–112)
Creatinine, Ser: 0.79 mg/dL (ref 0.50–1.10)
GFR calc Af Amer: >90 mL/min (ref >90)
GFR calc non Af Amer: >90 mL/min (ref >90)
Glucose, Bld: 104 mg/dL — ABNORMAL HIGH (ref 70–99)
Potassium: 3.7 mEq/L (ref 3.7–5.3)
Sodium: 141 mEq/L (ref 137–147)

## 2013-12-04 LAB — SURGICAL PCR SCREEN
MRSA, PCR: NEGATIVE
Staphylococcus aureus: NEGATIVE

## 2013-12-04 LAB — TYPE AND SCREEN
ABO/RH(D): O POS
Antibody Screen: NEGATIVE

## 2013-12-04 NOTE — Pre-Procedure Instructions (Signed)
LAURENCIA ROMA  12/04/2013   Your procedure is scheduled on: Tuesday, June 23rd   Report to [facility] Admitting at  1:30 PM.  Call this number if you have problems the morning of surgery: [phone]   Remember:   Do not eat food or drink liquids after midnight Monday.   Take these medicines the morning of surgery with A SIP OF WATER: Hydrocodone   Do not wear jewelry, make-up or nail polish.  Do not wear lotions, powders, or perfumes. You may NOT wear deodorant.  Do not shave underarms & legs 48 hours prior to surgery.    Do not bring valuables to the hospital.  [facility] is not responsible for any belongings or valuables.               Contacts, dentures or bridgework may not be worn into surgery.  Leave suitcase in the car. After surgery it may be brought to your room.  For patients admitted to the hospital, discharge time is determined by your treatment team.               Name and phone number of your driver:    Special Instructions: "Preparing for Surgery" instruction sheet.   Please read over the following fact sheets that you were given: Pain Booklet, Coughing and Deep Breathing, Blood Transfusion Information, MRSA Information and Surgical Site Infection Prevention

## 2013-12-04 NOTE — Progress Notes (Addendum)
Spoke with Dr. Albertina Parr regarding patient instruction concerning Metformin & amaryl.  Her sugar, in the morning runs around 90's, and her highest was 140's when she skips her meds. Since her surgery is not until 3, we instructed her to take her AM meds (6-22) and then hold the evening and 6-23 diabetes meds.  She will also be instructed to eat something "good".   She understands and verbalizes the instructions. DA

## 2013-12-11 MED ORDER — CLINDAMYCIN PHOSPHATE 900 MG/50ML IV SOLN
900.0000 mg | INTRAVENOUS | Status: AC
  Administered 2013-12-12: 900 mg via INTRAVENOUS
  Filled 2013-12-11: qty 50

## 2013-12-12 ENCOUNTER — Inpatient Hospital Stay (HOSPITAL_COMMUNITY): Payer: BC Managed Care – PPO

## 2013-12-12 ENCOUNTER — Encounter (HOSPITAL_COMMUNITY): Payer: Self-pay | Admitting: *Deleted

## 2013-12-12 ENCOUNTER — Inpatient Hospital Stay (HOSPITAL_COMMUNITY)
Admission: RE | Admit: 2013-12-12 | Discharge: 2013-12-15 | DRG: 470 | Disposition: A | Discharge status: Home Health | Payer: BC Managed Care – PPO | Source: Ambulatory Visit | Attending: Orthopaedic Surgery | Admitting: Orthopaedic Surgery

## 2013-12-12 ENCOUNTER — Encounter (HOSPITAL_COMMUNITY)
Admission: RE | Disposition: A | Discharge status: Home Health | Payer: Self-pay | Source: Ambulatory Visit | Attending: Orthopaedic Surgery

## 2013-12-12 ENCOUNTER — Encounter (HOSPITAL_COMMUNITY): Payer: BC Managed Care – PPO | Admitting: Certified Registered Nurse Anesthetist

## 2013-12-12 ENCOUNTER — Inpatient Hospital Stay (HOSPITAL_COMMUNITY): Payer: BC Managed Care – PPO | Admitting: Certified Registered Nurse Anesthetist

## 2013-12-12 DIAGNOSIS — Z7982 Long term (current) use of aspirin: Secondary | ICD-10-CM

## 2013-12-12 DIAGNOSIS — M1611 Unilateral primary osteoarthritis, right hip: Secondary | ICD-10-CM

## 2013-12-12 DIAGNOSIS — Z880 Allergy status to penicillin: Secondary | ICD-10-CM

## 2013-12-12 DIAGNOSIS — M169 Osteoarthritis of hip, unspecified: Principal | ICD-10-CM | POA: Diagnosis present

## 2013-12-12 DIAGNOSIS — Z79899 Other long term (current) drug therapy: Secondary | ICD-10-CM

## 2013-12-12 DIAGNOSIS — Z96649 Presence of unspecified artificial hip joint: Secondary | ICD-10-CM

## 2013-12-12 DIAGNOSIS — H409 Unspecified glaucoma: Secondary | ICD-10-CM | POA: Diagnosis present

## 2013-12-12 DIAGNOSIS — M1610 Unilateral primary osteoarthritis, unspecified hip: Principal | ICD-10-CM | POA: Diagnosis present

## 2013-12-12 DIAGNOSIS — D62 Acute posthemorrhagic anemia: Secondary | ICD-10-CM | POA: Diagnosis not present

## 2013-12-12 DIAGNOSIS — E119 Type 2 diabetes mellitus without complications: Secondary | ICD-10-CM | POA: Diagnosis present

## 2013-12-12 DIAGNOSIS — I10 Essential (primary) hypertension: Secondary | ICD-10-CM | POA: Diagnosis present

## 2013-12-12 HISTORY — PX: TOTAL HIP ARTHROPLASTY: SHX124

## 2013-12-12 LAB — GLUCOSE, CAPILLARY
Glucose-Capillary: 102 mg/dL — ABNORMAL HIGH (ref 70–99)
Glucose-Capillary: 115 mg/dL — ABNORMAL HIGH (ref 70–99)
Glucose-Capillary: 172 mg/dL — ABNORMAL HIGH (ref 70–99)

## 2013-12-12 SURGERY — ARTHROPLASTY, HIP, TOTAL, ANTERIOR APPROACH
Anesthesia: Choice | Site: Hip | Laterality: Right

## 2013-12-12 MED ORDER — LATANOPROST 0.005 % OP SOLN
1.0000 [drp] | Freq: Every day | OPHTHALMIC | Status: DC
  Administered 2013-12-12 – 2013-12-14 (×3): 1 [drp] via OPHTHALMIC
  Filled 2013-12-12: qty 2.5

## 2013-12-12 MED ORDER — NEOSTIGMINE METHYLSULFATE 10 MG/10ML IV SOLN
INTRAVENOUS | Status: DC | PRN
  Administered 2013-12-12: 4 mg via INTRAVENOUS

## 2013-12-12 MED ORDER — TRANEXAMIC ACID 100 MG/ML IV SOLN
1000.0000 mg | INTRAVENOUS | Status: AC
  Administered 2013-12-12: 1000 mg via INTRAVENOUS
  Filled 2013-12-12: qty 10

## 2013-12-12 MED ORDER — OXYCODONE HCL ER 10 MG PO T12A
10.0000 mg | EXTENDED_RELEASE_TABLET | Freq: Two times a day (BID) | ORAL | Status: DC
  Administered 2013-12-12 – 2013-12-14 (×5): 10 mg via ORAL
  Filled 2013-12-12 (×5): qty 1

## 2013-12-12 MED ORDER — METOCLOPRAMIDE HCL 10 MG PO TABS: 5.0000 mg | ORAL_TABLET | Freq: Three times a day (TID) | ORAL | Status: DC | PRN

## 2013-12-12 MED ORDER — ZOLPIDEM TARTRATE 5 MG PO TABS: 5.0000 mg | ORAL_TABLET | Freq: Every evening | ORAL | Status: DC | PRN

## 2013-12-12 MED ORDER — MIDAZOLAM HCL 2 MG/2ML IJ SOLN
INTRAMUSCULAR | Status: AC
  Filled 2013-12-12: qty 2

## 2013-12-12 MED ORDER — PROPOFOL 10 MG/ML IV BOLUS
INTRAVENOUS | Status: DC | PRN
  Administered 2013-12-12: 100 mg via INTRAVENOUS
  Administered 2013-12-12 (×2): 10 mg via INTRAVENOUS

## 2013-12-12 MED ORDER — METFORMIN HCL 500 MG PO TABS
500.0000 mg | ORAL_TABLET | Freq: Two times a day (BID) | ORAL | Status: DC
  Administered 2013-12-13 – 2013-12-15 (×5): 500 mg via ORAL
  Filled 2013-12-12 (×7): qty 1

## 2013-12-12 MED ORDER — FENTANYL CITRATE 0.05 MG/ML IJ SOLN
INTRAMUSCULAR | Status: AC
  Filled 2013-12-12: qty 5

## 2013-12-12 MED ORDER — ROCURONIUM BROMIDE 50 MG/5ML IV SOLN
INTRAVENOUS | Status: AC
  Filled 2013-12-12: qty 1

## 2013-12-12 MED ORDER — GLYCOPYRROLATE 0.2 MG/ML IJ SOLN
INTRAMUSCULAR | Status: AC
  Filled 2013-12-12: qty 4

## 2013-12-12 MED ORDER — NEOSTIGMINE METHYLSULFATE 10 MG/10ML IV SOLN
INTRAVENOUS | Status: AC
  Filled 2013-12-12: qty 1

## 2013-12-12 MED ORDER — ONDANSETRON HCL 4 MG PO TABS: 4.0000 mg | ORAL_TABLET | Freq: Four times a day (QID) | ORAL | Status: DC | PRN

## 2013-12-12 MED ORDER — METOCLOPRAMIDE HCL 5 MG/ML IJ SOLN: 5.0000 mg | Freq: Three times a day (TID) | INTRAMUSCULAR | Status: DC | PRN

## 2013-12-12 MED ORDER — POLYETHYLENE GLYCOL 3350 17 G PO PACK: 17.0000 g | PACK | Freq: Every day | ORAL | Status: DC | PRN

## 2013-12-12 MED ORDER — LACTATED RINGERS IV SOLN
INTRAVENOUS | Status: DC | PRN
  Administered 2013-12-12 (×3): via INTRAVENOUS

## 2013-12-12 MED ORDER — MIDAZOLAM HCL 5 MG/5ML IJ SOLN
INTRAMUSCULAR | Status: DC | PRN
  Administered 2013-12-12: 2 mg via INTRAVENOUS

## 2013-12-12 MED ORDER — ESMOLOL HCL 10 MG/ML IV SOLN
INTRAVENOUS | Status: AC
  Filled 2013-12-12: qty 10

## 2013-12-12 MED ORDER — LIDOCAINE HCL (CARDIAC) 20 MG/ML IV SOLN
INTRAVENOUS | Status: DC | PRN
  Administered 2013-12-12: 100 mg via INTRAVENOUS

## 2013-12-12 MED ORDER — ACETAMINOPHEN 325 MG PO TABS: 650.0000 mg | ORAL_TABLET | Freq: Four times a day (QID) | ORAL | Status: DC | PRN

## 2013-12-12 MED ORDER — ROCURONIUM BROMIDE 100 MG/10ML IV SOLN
INTRAVENOUS | Status: DC | PRN
  Administered 2013-12-12: 50 mg via INTRAVENOUS

## 2013-12-12 MED ORDER — SODIUM CHLORIDE 0.9 % IR SOLN
Status: DC | PRN
  Administered 2013-12-12: 1000 mL

## 2013-12-12 MED ORDER — LACTATED RINGERS IV SOLN
INTRAVENOUS | Status: DC
  Administered 2013-12-12: 13:00:00 via INTRAVENOUS

## 2013-12-12 MED ORDER — ONDANSETRON HCL 4 MG/2ML IJ SOLN: 4.0000 mg | Freq: Four times a day (QID) | INTRAMUSCULAR | Status: DC | PRN

## 2013-12-12 MED ORDER — METHOCARBAMOL 500 MG PO TABS
500.0000 mg | ORAL_TABLET | Freq: Four times a day (QID) | ORAL | Status: DC | PRN
  Administered 2013-12-12 – 2013-12-15 (×8): 500 mg via ORAL
  Filled 2013-12-12 (×9): qty 1

## 2013-12-12 MED ORDER — PHENOL 1.4 % MT LIQD: 1.0000 | OROMUCOSAL | Status: DC | PRN

## 2013-12-12 MED ORDER — ACETAMINOPHEN 650 MG RE SUPP: 650.0000 mg | Freq: Four times a day (QID) | RECTAL | Status: DC | PRN

## 2013-12-12 MED ORDER — FENTANYL CITRATE 0.05 MG/ML IJ SOLN
INTRAMUSCULAR | Status: DC | PRN
  Administered 2013-12-12: 50 ug via INTRAVENOUS
  Administered 2013-12-12: 100 ug via INTRAVENOUS
  Administered 2013-12-12 (×7): 50 ug via INTRAVENOUS

## 2013-12-12 MED ORDER — HYDROMORPHONE HCL PF 1 MG/ML IJ SOLN
INTRAMUSCULAR | Status: AC
  Filled 2013-12-12: qty 1

## 2013-12-12 MED ORDER — INSULIN ASPART 100 UNIT/ML ~~LOC~~ SOLN
0.0000 [IU] | Freq: Three times a day (TID) | SUBCUTANEOUS | Status: DC
  Administered 2013-12-14: 2 [IU] via SUBCUTANEOUS

## 2013-12-12 MED ORDER — METHOCARBAMOL 500 MG PO TABS
ORAL_TABLET | ORAL | Status: AC
  Filled 2013-12-12: qty 1

## 2013-12-12 MED ORDER — PHENYLEPHRINE HCL 10 MG/ML IJ SOLN
INTRAMUSCULAR | Status: DC | PRN
  Administered 2013-12-12 (×2): 80 ug via INTRAVENOUS

## 2013-12-12 MED ORDER — ONDANSETRON HCL 4 MG/2ML IJ SOLN
INTRAMUSCULAR | Status: AC
  Filled 2013-12-12: qty 2

## 2013-12-12 MED ORDER — 0.9 % SODIUM CHLORIDE (POUR BTL) OPTIME
TOPICAL | Status: DC | PRN
  Administered 2013-12-12: 1000 mL

## 2013-12-12 MED ORDER — CLINDAMYCIN PHOSPHATE 600 MG/50ML IV SOLN
600.0000 mg | Freq: Four times a day (QID) | INTRAVENOUS | Status: AC
  Administered 2013-12-12 – 2013-12-13 (×2): 600 mg via INTRAVENOUS
  Filled 2013-12-12 (×2): qty 50

## 2013-12-12 MED ORDER — PROPOFOL 10 MG/ML IV BOLUS
INTRAVENOUS | Status: AC
  Filled 2013-12-12: qty 20

## 2013-12-12 MED ORDER — HYDROMORPHONE HCL PF 1 MG/ML IJ SOLN
1.0000 mg | INTRAMUSCULAR | Status: DC | PRN
  Administered 2013-12-13 – 2013-12-14 (×5): 1 mg via INTRAVENOUS
  Filled 2013-12-12 (×5): qty 1

## 2013-12-12 MED ORDER — GLYCOPYRROLATE 0.2 MG/ML IJ SOLN
INTRAMUSCULAR | Status: DC | PRN
  Administered 2013-12-12: 0.6 mg via INTRAVENOUS

## 2013-12-12 MED ORDER — ALUM & MAG HYDROXIDE-SIMETH 200-200-20 MG/5ML PO SUSP: 30.0000 mL | ORAL | Status: DC | PRN

## 2013-12-12 MED ORDER — ASPIRIN EC 325 MG PO TBEC
325.0000 mg | DELAYED_RELEASE_TABLET | Freq: Two times a day (BID) | ORAL | Status: DC
  Administered 2013-12-13 – 2013-12-15 (×5): 325 mg via ORAL
  Filled 2013-12-12 (×7): qty 1

## 2013-12-12 MED ORDER — GLIMEPIRIDE 4 MG PO TABS
4.0000 mg | ORAL_TABLET | Freq: Every day | ORAL | Status: DC
  Administered 2013-12-13 – 2013-12-15 (×3): 4 mg via ORAL
  Filled 2013-12-12 (×4): qty 1

## 2013-12-12 MED ORDER — FERROUS SULFATE 325 (65 FE) MG PO TABS
325.0000 mg | ORAL_TABLET | Freq: Three times a day (TID) | ORAL | Status: DC
  Administered 2013-12-12 – 2013-12-15 (×7): 325 mg via ORAL
  Filled 2013-12-12 (×11): qty 1

## 2013-12-12 MED ORDER — ONDANSETRON HCL 4 MG/2ML IJ SOLN
INTRAMUSCULAR | Status: DC | PRN
  Administered 2013-12-12: 4 mg via INTRAVENOUS

## 2013-12-12 MED ORDER — DOCUSATE SODIUM 100 MG PO CAPS
100.0000 mg | ORAL_CAPSULE | Freq: Two times a day (BID) | ORAL | Status: DC
  Administered 2013-12-12 – 2013-12-14 (×5): 100 mg via ORAL
  Filled 2013-12-12 (×7): qty 1

## 2013-12-12 MED ORDER — DIPHENHYDRAMINE HCL 12.5 MG/5ML PO ELIX: 12.5000 mg | ORAL_SOLUTION | ORAL | Status: DC | PRN

## 2013-12-12 MED ORDER — HYDROMORPHONE HCL PF 1 MG/ML IJ SOLN
0.2500 mg | INTRAMUSCULAR | Status: DC | PRN
  Administered 2013-12-12 (×2): 1 mg via INTRAVENOUS

## 2013-12-12 MED ORDER — LOSARTAN POTASSIUM 25 MG PO TABS
25.0000 mg | ORAL_TABLET | Freq: Every day | ORAL | Status: DC
  Administered 2013-12-13 – 2013-12-14 (×2): 25 mg via ORAL
  Filled 2013-12-12 (×3): qty 1

## 2013-12-12 MED ORDER — LIDOCAINE HCL (CARDIAC) 20 MG/ML IV SOLN
INTRAVENOUS | Status: AC
  Filled 2013-12-12: qty 5

## 2013-12-12 MED ORDER — ESMOLOL HCL 10 MG/ML IV SOLN
INTRAVENOUS | Status: DC | PRN
  Administered 2013-12-12: 10 mg via INTRAVENOUS

## 2013-12-12 MED ORDER — SODIUM CHLORIDE 0.9 % IV SOLN
INTRAVENOUS | Status: DC
  Administered 2013-12-12 – 2013-12-13 (×2): via INTRAVENOUS

## 2013-12-12 MED ORDER — ALBUMIN HUMAN 5 % IV SOLN
INTRAVENOUS | Status: DC | PRN
  Administered 2013-12-12: 18:00:00 via INTRAVENOUS

## 2013-12-12 MED ORDER — OXYCODONE HCL 5 MG PO TABS
ORAL_TABLET | ORAL | Status: AC
Start: 2013-12-12 — End: 2013-12-13
  Filled 2013-12-12: qty 2

## 2013-12-12 MED ORDER — DEXTROSE 5 % IV SOLN
500.0000 mg | Freq: Four times a day (QID) | INTRAVENOUS | Status: DC | PRN
  Filled 2013-12-12: qty 5

## 2013-12-12 MED ORDER — MENTHOL 3 MG MT LOZG: 1.0000 | LOZENGE | OROMUCOSAL | Status: DC | PRN

## 2013-12-12 MED ORDER — OXYCODONE HCL 5 MG PO TABS
5.0000 mg | ORAL_TABLET | ORAL | Status: DC | PRN
  Administered 2013-12-12 – 2013-12-15 (×10): 10 mg via ORAL
  Filled 2013-12-12 (×9): qty 2

## 2013-12-12 SURGICAL SUPPLY — 52 items
APL SKNCLS STERI-STRIP NONHPOA (GAUZE/BANDAGES/DRESSINGS)
BANDAGE GAUZE ELAST BULKY 4 IN (GAUZE/BANDAGES/DRESSINGS) IMPLANT
BENZOIN TINCTURE PRP APPL 2/3 (GAUZE/BANDAGES/DRESSINGS) ×1 IMPLANT
BLADE SAW SGTL 18X1.27X75 (BLADE) ×2 IMPLANT
BLADE SURG ROTATE 9660 (MISCELLANEOUS) IMPLANT
CAPT HIP PF COP ×1 IMPLANT
CELLS DAT CNTRL 66122 CELL SVR (MISCELLANEOUS) ×1 IMPLANT
COVER SURGICAL LIGHT HANDLE (MISCELLANEOUS) ×2 IMPLANT
DRAPE C-ARM 42X72 X-RAY (DRAPES) ×2 IMPLANT
DRAPE STERI IOBAN 125X83 (DRAPES) ×2 IMPLANT
DRAPE U-SHAPE 47X51 STRL (DRAPES) ×6 IMPLANT
DRSG AQUACEL AG ADV 3.5X10 (GAUZE/BANDAGES/DRESSINGS) ×2 IMPLANT
DURAPREP 26ML APPLICATOR (WOUND CARE) ×2 IMPLANT
ELECT BLADE 4.0 EZ CLEAN MEGAD (MISCELLANEOUS)
ELECT BLADE 6.5 EXT (BLADE) IMPLANT
ELECT CAUTERY BLADE 6.4 (BLADE) ×2 IMPLANT
ELECT REM PT RETURN 9FT ADLT (ELECTROSURGICAL) ×2
ELECTRODE BLDE 4.0 EZ CLN MEGD (MISCELLANEOUS) IMPLANT
ELECTRODE REM PT RTRN 9FT ADLT (ELECTROSURGICAL) ×1 IMPLANT
FACESHIELD WRAPAROUND (MASK) ×4 IMPLANT
FACESHIELD WRAPAROUND OR TEAM (MASK) ×2 IMPLANT
GLOVE BIOGEL PI IND STRL 8 (GLOVE) ×2 IMPLANT
GLOVE BIOGEL PI INDICATOR 8 (GLOVE) ×2
GLOVE ECLIPSE 8.0 STRL XLNG CF (GLOVE) ×3 IMPLANT
GLOVE ORTHO TXT STRL SZ7.5 (GLOVE) ×5 IMPLANT
GOWN STRL REUS W/ TWL XL LVL3 (GOWN DISPOSABLE) ×2 IMPLANT
GOWN STRL REUS W/TWL XL LVL3 (GOWN DISPOSABLE) ×4
HANDPIECE INTERPULSE COAX TIP (DISPOSABLE) ×2
KIT BASIN OR (CUSTOM PROCEDURE TRAY) ×2 IMPLANT
KIT ROOM TURNOVER OR (KITS) ×2 IMPLANT
MANIFOLD NEPTUNE II (INSTRUMENTS) ×3 IMPLANT
NS IRRIG 1000ML POUR BTL (IV SOLUTION) ×2 IMPLANT
PACK TOTAL JOINT (CUSTOM PROCEDURE TRAY) ×2 IMPLANT
PAD ARMBOARD 7.5X6 YLW CONV (MISCELLANEOUS) ×4 IMPLANT
RETRACTOR WND ALEXIS 18 MED (MISCELLANEOUS) ×1 IMPLANT
RTRCTR WOUND ALEXIS 18CM MED (MISCELLANEOUS) ×2
SET HNDPC FAN SPRY TIP SCT (DISPOSABLE) ×1 IMPLANT
SPONGE LAP 18X18 X RAY DECT (DISPOSABLE) ×1 IMPLANT
SPONGE LAP 4X18 X RAY DECT (DISPOSABLE) IMPLANT
STRIP CLOSURE SKIN 1/2X4 (GAUZE/BANDAGES/DRESSINGS) ×2 IMPLANT
SUT ETHIBOND NAB CT1 #1 30IN (SUTURE) ×2 IMPLANT
SUT MNCRL AB 4-0 PS2 18 (SUTURE) ×2 IMPLANT
SUT VIC AB 0 CT1 27 (SUTURE) ×4
SUT VIC AB 0 CT1 27XBRD ANBCTR (SUTURE) ×1 IMPLANT
SUT VIC AB 1 CT1 27 (SUTURE) ×4
SUT VIC AB 1 CT1 27XBRD ANBCTR (SUTURE) ×1 IMPLANT
SUT VIC AB 2-0 CT1 27 (SUTURE) ×4
SUT VIC AB 2-0 CT1 TAPERPNT 27 (SUTURE) ×1 IMPLANT
TOWEL OR 17X24 6PK STRL BLUE (TOWEL DISPOSABLE) ×2 IMPLANT
TOWEL OR 17X26 10 PK STRL BLUE (TOWEL DISPOSABLE) ×2 IMPLANT
TRAY FOLEY CATH 16FRSI W/METER (SET/KITS/TRAYS/PACK) ×1 IMPLANT
WATER STERILE IRR 1000ML POUR (IV SOLUTION) ×2 IMPLANT

## 2013-12-12 NOTE — Transfer of Care (Signed)
Immediate Anesthesia Transfer of Care Note  Patient: [name]  Procedure(s) Performed: Procedure(s): RIGHT TOTAL HIP ARTHROPLASTY ANTERIOR APPROACH (Right)  Patient Location: PACU  Anesthesia Type:General  Level of Consciousness: awake, alert , patient cooperative and responds to stimulation  Airway & Oxygen Therapy: Patient Spontanous Breathing and Patient connected to nasal cannula oxygen  Post-op Assessment: Report given to PACU RN and Post -op Vital signs reviewed and stable  Post vital signs: Reviewed and stable  Complications: No apparent anesthesia complications

## 2013-12-12 NOTE — Brief Op Note (Signed)
12/12/2013  6:33 PM  PATIENT:  [name]  58 y.o. female  PRE-OPERATIVE DIAGNOSIS:  Right hip osteoarthritis  POST-OPERATIVE DIAGNOSIS:  Right hip osteoarthritis  PROCEDURE:  Procedure(s): RIGHT TOTAL HIP ARTHROPLASTY ANTERIOR APPROACH (Right)  SURGEON:  Surgeon(s) and Role:    * Mcarthur Rossetti, MD - Primary  PHYSICIAN ASSISTANT: Benita Stabile, PA-C  ANESTHESIA:   general  EBL:  Total I/O In: 9563 [I.V.:2000; IV Piggyback:250] Out: 900 [Urine:250; Blood:650]  BLOOD ADMINISTERED:none  DRAINS: none   LOCAL MEDICATIONS USED:  NONE  SPECIMEN:  No Specimen  DISPOSITION OF SPECIMEN:  N/A  COUNTS:  YES  TOURNIQUET:  * No tourniquets in log *  DICTATION: .Other Dictation: Dictation Number [id number]  PLAN OF CARE: Admit to inpatient   PATIENT DISPOSITION:  PACU - hemodynamically stable.   Delay start of Pharmacological VTE agent (>24hrs) due to surgical blood loss or risk of bleeding: no

## 2013-12-12 NOTE — Progress Notes (Signed)
[name] has had an uneventful pacu stay, Sleeps off/on yet when she is awakened wil state pain range from 6-9/10. Yet when given meds there seems little difference in responses yet is easily distracted. [name] and jokes with staff and isn't focused on pain [place]. [name]. Will move to room.

## 2013-12-12 NOTE — Anesthesia Preprocedure Evaluation (Signed)
Anesthesia Evaluation  Patient identified by MRN, date of birth, ID band Patient awake    Reviewed: Allergy & Precautions, H&P , NPO status , Patient's Chart, lab work & pertinent test results  Airway Mallampati: II TM Distance: >3 FB Neck ROM: Full    Dental  (+) Missing   Pulmonary  breath sounds clear to auscultation        Cardiovascular hypertension, Pt. on medications Rhythm:Regular Rate:Normal     Neuro/Psych    GI/Hepatic   Endo/Other  diabetes, Well Controlled, Type 2  Renal/GU      Musculoskeletal   Abdominal (+)  Abdomen: soft.    Peds  Hematology   Anesthesia Other Findings   Reproductive/Obstetrics                           Anesthesia Physical Anesthesia Plan  ASA: II  Anesthesia Plan: General   Post-op Pain Management:    Induction: Intravenous  Airway Management Planned: Oral ETT  Additional Equipment:   Intra-op Plan:   Post-operative Plan: Extubation in OR  Informed Consent:   Plan Discussed with: Anesthesiologist and Surgeon  Anesthesia Plan Comments:         Anesthesia Quick Evaluation

## 2013-12-12 NOTE — H&P (Signed)
TOTAL HIP ADMISSION H&P  Patient is admitted for right total hip arthroplasty.  Subjective:  Chief Complaint: right hip pain  HPI: [name], 58 y.o. female, has a history of pain and functional disability in the right hip(s) due to arthritis and patient has failed non-surgical conservative treatments for greater than 12 weeks to include NSAID's and/or analgesics, corticosteriod injections, flexibility and strengthening excercises, use of assistive devices, weight reduction as appropriate and activity modification.  Onset of symptoms was gradual starting 5 years ago with gradually worsening course since that time.The patient noted no past surgery on the right hip(s).  Patient currently rates pain in the right hip at 10 out of 10 with activity. Patient has night pain, worsening of pain with activity and weight bearing, trendelenberg gait, pain that interfers with activities of daily living and pain with passive range of motion. Patient has evidence of subchondral cysts, subchondral sclerosis, periarticular osteophytes and joint space narrowing by imaging studies. This condition presents safety issues increasing the risk of falls.  There is no current active infection.  Patient Active Problem List   Diagnosis Date Noted  . Arthritis of right hip 12/12/2013  . Benign neoplasm of colon 08/22/2013  . Special screening for malignant neoplasms, colon 08/22/2013  . Abdominal pain 08/19/2013  . Diabetes mellitus 08/19/2013  . Essential hypertension, benign 08/19/2013  . Ventral hernia 08/19/2013   Past Medical History  Diagnosis Date  . Hypertension   . Diabetes mellitus without complication   . Anemia 2006    required transfusion post TAH/BSO 05/2005  . GLA deficiency   . Glaucoma of both eyes     Past Surgical History  Procedure Laterality Date  . Total abdominal hysterectomy w/ bilateral salpingoophorectomy  05/2005  . Hysteroscopy w/d&c  01/2005    for uterine fibroids.   .  Esophagogastroduodenoscopy N/A 08/22/2013    Procedure: ESOPHAGOGASTRODUODENOSCOPY (EGD);  Surgeon: Irene Shipper, MD;  Location: [facility] ENDOSCOPY;  Service: Endoscopy;  Laterality: N/A;  . Colonoscopy N/A 08/22/2013    Procedure: COLONOSCOPY;  Surgeon: Irene Shipper, MD;  Location: [place];  Service: Endoscopy;  Laterality: N/A;  . Ventral hernia repair N/A 08/24/2013    Procedure: HERNIA REPAIR VENTRAL ADULT;  Surgeon: Edward Jolly, MD;  Location: [place];  Service: General;  Laterality: N/A;  . Insertion of mesh N/A 08/24/2013    Procedure: INSERTION OF MESH;  Surgeon: Edward Jolly, MD;  Location: [place];  Service: General;  Laterality: N/A;  . Panniculectomy N/A 08/24/2013    Procedure: PANNICULECTOMY;  Surgeon: Edward Jolly, MD;  Location: [place];  Service: General;  Laterality: N/A;    Prescriptions prior to admission  Medication Sig Dispense Refill  . bimatoprost (LUMIGAN) 0.03 % ophthalmic solution Place 1 drop into both eyes at bedtime.      . bisacodyl (DULCOLAX) 10 MG suppository Place 1 suppository (10 mg total) rectally daily as needed for mild constipation or moderate constipation.    0  . glimepiride (AMARYL) 4 MG tablet Take 4 mg by mouth daily with breakfast.      . HYDROcodone-acetaminophen (NORCO/VICODIN) 5-325 MG per tablet Take 1-2 tablets by mouth every 6 (six) hours as needed for moderate pain.  40 tablet  0  . losartan (COZAAR) 25 MG tablet Take 25 mg by mouth daily.      . metFORMIN (GLUCOPHAGE) 500 MG tablet Take 500 mg by mouth 2 (two) times daily with a meal.  Allergies  Allergen Reactions  . Penicillins Hives    History  Substance Use Topics  . Smoking status: Never Smoker   . Smokeless tobacco: Not on file  . Alcohol Use: No    No family history on file.   Review of Systems  Musculoskeletal: Positive for joint pain.  All other systems reviewed and are negative.   Objective:  Physical Exam  Constitutional: She is oriented to person,  place, and time. She appears well-developed and well-nourished.  HENT:  Head: Normocephalic and atraumatic.  Eyes: EOM are normal. Pupils are equal, round, and reactive to light.  Neck: Normal range of motion. Neck supple.  Cardiovascular: Normal rate and regular rhythm.   Respiratory: Effort normal and breath sounds normal.  GI: Soft. Bowel sounds are normal.  Musculoskeletal:       Right hip: She exhibits decreased range of motion, decreased strength and bony tenderness.  Neurological: She is alert and oriented to person, place, and time.  Skin: Skin is warm and dry.  Psychiatric: She has a normal mood and affect.    Vital signs in last 24 hours:    Labs:   Estimated body mass index is 37.60 kg/(m^2) as calculated from the following:   Height as of 10/27/13: 5\' 2"  (1.575 m).   Weight as of 10/27/13: 93.26 kg (205 lb 9.6 oz).   Imaging Review Plain radiographs demonstrate severe degenerative joint disease of the right hip(s). The bone quality appears to be good for age and reported activity level.  Assessment/Plan:  End stage arthritis, right hip(s)  The patient history, physical examination, clinical judgement of the provider and imaging studies are consistent with end stage degenerative joint disease of the right hip(s) and total hip arthroplasty is deemed medically necessary. The treatment options including medical management, injection therapy, arthroscopy and arthroplasty were discussed at length. The risks and benefits of total hip arthroplasty were presented and reviewed. The risks due to aseptic loosening, infection, stiffness, dislocation/subluxation,  thromboembolic complications and other imponderables were discussed.  The patient acknowledged the explanation, agreed to proceed with the plan and consent was signed. Patient is being admitted for inpatient treatment for surgery, pain control, PT, OT, prophylactic antibiotics, VTE prophylaxis, progressive ambulation and ADL's  and discharge planning.The patient is planning to be discharged home with home health services

## 2013-12-12 NOTE — Anesthesia Postprocedure Evaluation (Signed)
  Anesthesia Post-op Note  Patient: [name]  Procedure(s) Performed: Procedure(s): RIGHT TOTAL HIP ARTHROPLASTY ANTERIOR APPROACH (Right)  Patient Location: PACU  Anesthesia Type:General  Level of Consciousness: awake  Airway and Oxygen Therapy: Patient Spontanous Breathing  Post-op Pain: mild  Post-op Assessment: Post-op Vital signs reviewed  Post-op Vital Signs: Reviewed  Last Vitals:  Filed Vitals:   12/12/13 1955  BP: 144/72  Pulse: 67  Temp: 36.5 C  Resp: 16    Complications: No apparent anesthesia complications

## 2013-12-13 LAB — GLUCOSE, CAPILLARY
Glucose-Capillary: 141 mg/dL — ABNORMAL HIGH (ref 70–99)
Glucose-Capillary: 131 mg/dL — ABNORMAL HIGH (ref 70–99)
Glucose-Capillary: 135 mg/dL — ABNORMAL HIGH (ref 70–99)
Glucose-Capillary: 113 mg/dL — ABNORMAL HIGH (ref 70–99)

## 2013-12-13 LAB — CBC
HCT: 32 % — ABNORMAL LOW (ref 36.0–46.0)
Hemoglobin: 10.2 g/dL — ABNORMAL LOW (ref 12.0–15.0)
MCH: 27.9 pg (ref 26.0–34.0)
MCHC: 31.9 g/dL (ref 30.0–36.0)
MCV: 87.4 fL (ref 78.0–100.0)
Platelets: 278 10*3/uL (ref 150–400)
RBC: 3.66 MIL/uL — ABNORMAL LOW (ref 3.87–5.11)
RDW: 14.5 % (ref 11.5–15.5)
WBC: 6 10*3/uL (ref 4.0–10.5)

## 2013-12-13 LAB — BASIC METABOLIC PANEL WITH GFR
BUN: 13 mg/dL (ref 6–23)
CO2: 25 meq/L (ref 19–32)
Calcium: 8.7 mg/dL (ref 8.4–10.5)
Chloride: 100 meq/L (ref 96–112)
Creatinine, Ser: 0.84 mg/dL (ref 0.50–1.10)
GFR calc Af Amer: 88 mL/min — ABNORMAL LOW
GFR calc non Af Amer: 76 mL/min — ABNORMAL LOW
Glucose, Bld: 153 mg/dL — ABNORMAL HIGH (ref 70–99)
Potassium: 4.3 meq/L (ref 3.7–5.3)
Sodium: 137 meq/L (ref 137–147)

## 2013-12-13 MED ORDER — PNEUMOCOCCAL VAC POLYVALENT 25 MCG/0.5ML IJ INJ
0.5000 mL | INJECTION | INTRAMUSCULAR | Status: DC
  Filled 2013-12-13: qty 0.5

## 2013-12-13 NOTE — Plan of Care (Signed)
Problem: Consults Goal: Diagnosis- Total Joint Replacement Primary Total Hip     

## 2013-12-13 NOTE — Evaluation (Signed)
Occupational Therapy Evaluation Patient Details Name: [name] MRN: [id number] DOB: Dec 27, 1955 Today's Date: 12/13/2013    History of Present Illness 58 y.o. female s/p right total hip arthroplasty. Hx of HTN and DM.   Clinical Impression   Pt admitted with the above diagnoses and presents with below problem list. Pt will benefit from continued acute OT to address the below listed deficits and maximize independence with basic ADLs prior to d/c. PTA pt needed min A for dressing; pt stated she could do with mod I (extra time). She was independent with all other basic ADLs. Currently pt is mod A for LB ADLs and min A for UB bathing/dressing due to balance. Pt's spouse is available for supervision/assistance in the home with the exception of 4 hours on MWF when he is at dialysis. Pt has no arrangements for coverage during that time. OT to continue to follow.      Follow Up Recommendations  Supervision/Assistance - 24 hour;No OT follow up    Equipment Recommendations  3 in 1 bedside comode    Recommendations for Other Services       Precautions / Restrictions Precautions Precautions: None Precaution Comments: Direct anterior approach - no hip precautions Restrictions Weight Bearing Restrictions: Yes RLE Weight Bearing: Weight bearing as tolerated      Mobility Bed Mobility Overal bed mobility: Needs Assistance Bed Mobility: Supine to Sit     Supine to sit: Min assist;HOB elevated     General bed mobility comments: min A for moving RLE across bed and lowering to floor, cues for technique  Transfers Overall transfer level: Needs assistance Equipment used: Rolling walker (2 wheeled) Transfers: Sit to/from Stand Sit to Stand: Min assist;From elevated surface              Balance Overall balance assessment: Needs assistance Sitting-balance support: Bilateral upper extremity supported;Feet supported Sitting balance-Leahy Scale: Poor Sitting balance - Comments: pt  bearing weight through BUE EOB   Standing balance support: Bilateral upper extremity supported;During functional activity Standing balance-Leahy Scale: Poor Standing balance comment: initially min A to steady in standing, progressed to min guard. stood ~ 1 minute                            ADL Overall ADL's : Needs assistance/impaired Eating/Feeding: Set up;Sitting   Grooming: Set up;Sitting   Upper Body Bathing: Sitting;Minimal assitance Upper Body Bathing Details (indicate cue type and reason): min A due to sitting balance Lower Body Bathing: With adaptive equipment;Sit to/from stand;Moderate assistance   Upper Body Dressing : Minimal assistance;Sitting   Lower Body Dressing: Moderate assistance;Sit to/from stand   Toilet Transfer: Min guard;Ambulation;BSC;RW   Toileting- Clothing Manipulation and Hygiene: Minimal assistance;Sit to/from stand Toileting - Clothing Manipulation Details (indicate cue type and reason): min A due to balance Tub/ Shower Transfer: Min guard;Ambulation;3 in 1;Rolling walker   Functional mobility during ADLs: Min guard;Rolling walker General ADL Comments: Pt educated on use of reacher and sock aid. Min A needed for ADLs     Vision                     Perception     Praxis      Pertinent Vitals/Pain 5/10 pain. Increased activity during session.     Hand Dominance Right   Extremity/Trunk Assessment Upper Extremity Assessment Upper Extremity Assessment: within functional limits for tasks assessed    Lower Extremity Assessment Lower Extremity Assessment:  Defer to PT evaluation       Communication Communication Communication: No difficulties   Cognition Arousal/Alertness: Awake/alert Behavior During Therapy: WFL for tasks assessed/performed Overall Cognitive Status: Within Functional Limits for tasks assessed                     General Comments       Exercises       Shoulder Instructions      Home  Living Family/patient expects to be discharged to:: Private residence Living Arrangements: Spouse/significant other Available Help at Discharge: Family;Other (Comment) (husband available day/night except for 4 hours on M/W/F) Type of Home: House Home Access: Stairs to enter [employer] of Steps: 3 Entrance Stairs-Rails: None Home Layout: One level     Bathroom Shower/Tub: Teacher, early years/pre: Standard     Home Equipment: Environmental consultant - 2 wheels;Cane - single point Adaptive Equipment: Reacher Additional Comments: pt has to walk sideways with rw to access bathroom      Prior Functioning/Environment Level of Independence: Needs assistance;Independent with assistive device(s)    ADL's / Homemaking Assistance Needed: min A with dressing, pt states she could do it herself but it took a long time   Comments: Cane for ambulation    OT Diagnosis: Acute pain   OT Problem List: Decreased strength;Decreased range of motion;Decreased activity tolerance;Impaired balance (sitting and/or standing);Decreased knowledge of use of DME or AE;Decreased knowledge of precautions;Pain   OT Treatment/Interventions: Self-care/ADL training;Therapeutic exercise;DME and/or AE instruction;Therapeutic activities;Patient/family education;Balance training    OT Goals(Current goals can be found in the care plan section) Acute Rehab OT Goals Patient Stated Goal: to walk without a limp OT Goal Formulation: With patient Time For Goal Achievement: 12/20/13 Potential to Achieve Goals: Good ADL Goals Pt Will Perform Grooming: with supervision;standing Pt Will Perform Lower Body Bathing: with min guard assist;with adaptive equipment;sit to/from stand Pt Will Perform Lower Body Dressing: with supervision;with adaptive equipment;sit to/from stand Pt Will Transfer to Toilet: with supervision;ambulating (3n1 over toilet) Pt Will Perform Toileting - Clothing Manipulation and hygiene: with  supervision;sit to/from stand Pt Will Perform Tub/Shower Transfer: with supervision;ambulating;3 in 1;rolling walker Additional ADL Goal #1: Pt will perform supine<>sit at supervision level to prepare for OOB ADLs.  OT Frequency: Min 3X/week   Barriers to D/C:    no assistance/supervision for 4 hours M/W/F while spouse is at dialysis       Co-evaluation              End of Session Equipment Utilized During Treatment: Gait belt;Rolling walker  Activity Tolerance: Patient tolerated treatment well;Patient limited by pain Patient left: in chair;with call bell/phone within reach;with nursing/sitter in room   Time: 0910-0935 OT Time Calculation (min): 25 min Charges:  OT General Charges $OT Visit: 1 Procedure OT Evaluation $Initial OT Evaluation Tier I: 1 Procedure OT Treatments $Self Care/Home Management : 8-22 mins G-Codes:    Hortencia Pilar 12/14/2013, 10:55 AM

## 2013-12-13 NOTE — Evaluation (Signed)
Physical Therapy Evaluation Patient Details Name: [name] MRN: [id number] DOB: [birth year] Today's Date: 12/13/2013   History of Present Illness  58 y.o. female s/p right total hip arthroplasty. Hx of HTN and DM.  Clinical Impression  Patient is seen following the above procedure and presents with functional limitations due to the deficits listed below (see PT Problem List). Anticipate pt will progress well and be able to d/c home with family support. Patient will benefit from skilled PT to increase their independence and safety with mobility to allow discharge to the venue listed below. Will continue to follow acutely for functional mobility training.      Follow Up Recommendations Home health PT;Supervision for mobility/OOB    Equipment Recommendations  3in1 (PT)    Recommendations for Other Services OT consult     Precautions / Restrictions Precautions Precautions: None Precaution Comments: Direct anterior approach - no hip precautions Restrictions Weight Bearing Restrictions: Yes RLE Weight Bearing: Weight bearing as tolerated      Mobility  Bed Mobility                  Transfers Overall transfer level: Needs assistance Equipment used: Rolling walker (2 wheeled) Transfers: Sit to/from Stand Sit to Stand: Min guard;+2 safety/equipment         General transfer comment: Min guard for safety second person available but not needed. VCs for hand placement. No physical assist required. Performed from recliner.  Ambulation/Gait Ambulation/Gait assistance: Min guard;+2 safety/equipment Ambulation Distance (Feet): 100 Feet Assistive device: Rolling walker (2 wheeled) Gait Pattern/deviations: Step-to pattern;Step-through pattern;Decreased step length - left;Decreased stance time - right;Antalgic   Gait velocity interpretation: Below normal speed for age/gender General Gait Details: Educated on safe use of DME for ambulation with VCs for sequencing and walker  placement. Moderately antalgic but demonstrates ability to bear majority of weight through RLE.  Stairs            Wheelchair Mobility    Modified Rankin (Stroke Patients Only)       Balance Overall balance assessment: Needs assistance Sitting-balance support: No upper extremity supported;Feet supported Sitting balance-Leahy Scale: Good     Standing balance support: Single extremity supported Standing balance-Leahy Scale: Poor                               Pertinent Vitals/Pain Pt reports she is doing "pretty well" at rest. Requests nurse be notified for pain medication at end of therapy. Nurse notified. Patient repositioned in chair for comfort.     Home Living Family/patient expects to be discharged to:: Private residence Living Arrangements: Spouse/significant other Available Help at Discharge: Family;Other (Comment) (husband available day/night except for 4 hours on M/W/F) Type of Home: House Home Access: Stairs to enter Entrance Stairs-Rails: None Entrance Stairs-Number of Steps: 3 Home Layout: One level Home Equipment: Walker - 2 wheels;Cane - single point Additional Comments: pt has to walk sideways with rw to access bathroom    Prior Function Level of Independence: Needs assistance;Independent with assistive device(s)         Comments: Cane for ambulation     Hand Dominance   Dominant Hand: Right    Extremity/Trunk Assessment   Upper Extremity Assessment: Defer to OT evaluation           Lower Extremity Assessment: RLE deficits/detail RLE Deficits / Details: decreased strength and ROM as expected post op THA       Communication  Communication: No difficulties  Cognition Arousal/Alertness: Awake/alert Behavior During Therapy: WFL for tasks assessed/performed Overall Cognitive Status: Within Functional Limits for tasks assessed                      General Comments      Exercises Total Joint Exercises Ankle  Circles/Pumps: AROM;Both;10 reps;Seated Quad Sets: AROM;Both;10 reps;Seated Gluteal Sets: Strengthening;Both;10 reps;Seated      Assessment/Plan    PT Assessment Patient needs continued PT services  PT Diagnosis Difficulty walking;Abnormality of gait;Acute pain   PT Problem List Decreased strength;Decreased range of motion;Decreased activity tolerance;Decreased balance;Decreased mobility;Decreased knowledge of use of DME;Decreased knowledge of precautions;Pain  PT Treatment Interventions DME instruction;Gait training;Stair training;Functional mobility training;Therapeutic activities;Therapeutic exercise;Balance training;Neuromuscular re-education;Patient/family education;Modalities   PT Goals (Current goals can be found in the Care Plan section) Acute Rehab PT Goals Patient Stated Goal: Go home PT Goal Formulation: With patient Time For Goal Achievement: 12/20/13 Potential to Achieve Goals: Good    Frequency 7X/week   Barriers to discharge        Co-evaluation               End of Session   Activity Tolerance: Patient tolerated treatment well Patient left: in chair;with call bell/phone within reach Nurse Communication: Mobility status;Patient requests pain meds         Time: 1005-1025 PT Time Calculation (min): 20 min   Charges:   PT Evaluation $Initial PT Evaluation Tier I: 1 Procedure PT Treatments $Gait Training: 8-22 mins   PT G Codes:        Elayne Snare, [place]   Ellouise Newer 12/13/2013, 1:13 PM

## 2013-12-13 NOTE — Op Note (Signed)
[name], [name] NO.:  [id number]  MEDICAL RECORD NO.:  [id number]  LOCATION:  5N20C                        FACILITY:  [facility]  PHYSICIAN:  Lind Guest. Ninfa Linden, M.D.DATE OF BIRTH:  May 27, 1956  DATE OF PROCEDURE:  12/12/2013 DATE OF DISCHARGE:                              OPERATIVE REPORT   PREOPERATIVE DIAGNOSIS:  Severe end-stage arthritis and degenerative joint disease, right hip.  POSTOPERATIVE DIAGNOSES:  Severe end-stage arthritis and degenerative joint disease, right hip.  PROCEDURE:  Right total hip arthroplasty through direct anterior approach.  IMPLANTS:  DePuy Sector Gription acetabular component size 48 with apex hole eliminator guide and a single screw, size 32+ 4 neutral polyethylene liner, size 8 Corail femoral component with standard offset, size 32+ 1 ceramic hip ball.  SURGEON:  Lind Guest. Ninfa Linden, M.D.  ASSISTANT:  Erskine Emery, PA-C  ANESTHESIA:  General.  ANTIBIOTICS:  2 g IV Ancef.  BLOOD LOSS:  650-700 mL.  COMPLICATIONS:  None.  INDICATIONS:  [name] is 58 year old female, well known to me.  I have been seeing her for many years now for debilitating pain involving her right hip.  She has radiographic evidence of complete loss of her joint space, subchondral sclerotic changes in the femoral head and acetabulum, periarticular osteophytes as well as severe joint space narrowing.  It is at the point where she has failed all modes of conservative treatment.  Her pain is daily, her mobility is limited as well as her activities of daily living, and quality of life diminished. At this point, she wished to proceed with a total hip arthroplasty.  She understands the risks of acute blood loss anemia, nerve and vessel injury, fracture, infection, dislocation, DVT.  She understands the goals are decreased pain, improved mobility, and overall improved quality of life.  PROCEDURE DESCRIPTION:  After informed consent was  obtained, appropriate right hip was marked.  She was brought to the operating room and general anesthesia was obtained while she was on the stretcher.  A Foley catheter was placed and then traction boots were placed on both her feet.  Next, she was placed supine on the HANA fracture table with the perineal post in place and both legs in inline skeletal traction devices, but no traction applied.  Her hips assessed radiographically, and hen we prepped the right hip with DuraPrep and sterile drapes.  A time-out was called, she was identified as correct patient, correct right hip.  We then made an incision just inferior and posterior to the anterior-superior iliac spine and carried this obliquely down the leg. I dissected down to the tensor fascia lata muscle and the tensor fascia was then divided longitudinally so I could see the direct anterior approach to the hip.  A Cobra retractor was placed around the lateral neck and up underneath the rectus femoris on medial neck.  I cauterized the lateral femoral circumflex vessels.  I then made a capsular incision.  Due to significant adipose tissue, we had excised much of the capsule.  We found a head devoid of cartilage.  We made our femoral neck cut just proximal to the lesser trochanter.  Completed this with an osteotome.  We then removed  the femoral head in its entirety and cleaned the acetabulum debris including remnants of acetabular labrum, then began reaming from in 2 mm increments from size 42 up to a size 48 with all reamers under direct visualization, and the last reamer under direct fluoroscopy so we could obtain our depth of reaming, inclination and anteversion.  Once I was pleased with this, we placed the real DePuy Sector Gription acetabular component size 48, the apex hole eliminator guide, a single screw and a 32+ 4 neutral liner.  We then placed the polyethylene liner.  We then turned our attention to the femur with the leg  externally rotated to 100 degrees, extended and adducted, we able to place a Mueller retractor medially and Hohmann retractor behind the greater trochanter.  We released the lateral joint capsule and then used a box cutting osteotome entered the femoral canal and a rongeur lateralized.  This took significant effort skills, her bone was very tight and a very tight canal with thick cortices.  After multiple passes with the starter broach, we were finally able to get a size 8 broach down so we trialed a standard neck and a 32+ 1 hip ball.  We rolled the leg back over and up with traction and internal rotation, it was stable with leg lengths showing that she is still longer on that side but her offsets were near equal, and there was nothing else we could do in terms of medialize the cup enough, and I brought the femoral neck cut all the way down to almost the calcar.  We then dislocated the hip and removed the trial components and lateralized a little bit more and we placed the real Corail femoral component size 8 and the real 32+ 1 hip ball.  We brought the leg back over and up with traction and internal rotation, reducing into the pelvis and it was stable.  We then copiously irrigated soft tissue with normal saline solution using pulsatile lavage.  We closed the tensor fascia with interrupted #1 Vicryl suture followed by 0 Vicryl in the deep tissue, 0 Vicryl subcutaneous tissue, and staples on the skin.  Well-padded sterile dressing was applied.  She was taken off the HANA table, awakened, extubated, and taken to recovery room in stable condition.  All final counts were correct, and no complications noted.  Of note, Erskine Emery, PA-C, assisted in the entire case and this assistance was crucial in getting this case completed.     Lind Guest. Ninfa Linden, M.D.     CYB/MEDQ  D:  12/12/2013  T:  12/13/2013  Job:  [id number]

## 2013-12-13 NOTE — Progress Notes (Signed)
Subjective: 1 Day Post-Op Procedure(s) (LRB): RIGHT TOTAL HIP ARTHROPLASTY ANTERIOR APPROACH (Right) Patient reports pain as moderate.  Asymptomatice acute blood loss anemia.  Objective: Vital signs in last 24 hours: Temp:  [97.7 F (36.5 C)-99.2 F (37.3 C)] 99.2 F (37.3 C) (06/24 0622) Pulse Rate:  [63-117] 82 (06/24 0622) Resp:  [11-20] 18 (06/24 0622) BP: (108-149)/(65-108) 112/65 mmHg (06/24 0622) SpO2:  [98 %-100 %] 100 % (06/24 0622) Weight:  [92.987 kg (205 lb)] 92.987 kg (205 lb) (06/23 1250)  Intake/Output from previous day: 06/23 0701 - 06/24 0700 In: 4227.5 [P.O.:540; I.V.:3437.5; IV Piggyback:250] Out: 1960 [Urine:1310; Blood:650] Intake/Output this shift:     Recent Labs  12/13/13 0536  HGB 10.2*    Recent Labs  12/13/13 0536  WBC 6.0  RBC 3.66*  HCT 32.0*  PLT 278   No results found for this basename: NA, K, CL, CO2, BUN, CREATININE, GLUCOSE, CALCIUM,  in the last 72 hours No results found for this basename: LABPT, INR,  in the last 72 hours  Sensation intact distally Intact pulses distally Dorsiflexion/Plantar flexion intact Incision: dressing C/D/I  Assessment/Plan: 1 Day Post-Op Procedure(s) (LRB): RIGHT TOTAL HIP ARTHROPLASTY ANTERIOR APPROACH (Right) Up with therapy  [name] Y 12/13/2013, 7:13 AM

## 2013-12-13 NOTE — Progress Notes (Signed)
Physical Therapy Treatment Patient Details Name: [name] MRN: [id number] DOB: 12-11-1955 Today's Date: 12/13/2013    History of Present Illness 58 y.o. female s/p right total hip arthroplasty. Hx of HTN and DM.    PT Comments    Patient is progressing well towards physical therapy goals, ambulating up to 100 feet with min guard for safety. Completed stair training this afternoon and although pt performs this task safely, she reports feeling uncertain and is fearful. Will practice again at next therapy session. Patient will continue to benefit from skilled physical therapy services to further improve independence with functional mobility.    Follow Up Recommendations  Home health PT;Supervision for mobility/OOB     Equipment Recommendations  3in1 (PT);Rolling walker with 5" wheels (YOUTH walker)    Recommendations for Other Services OT consult     Precautions / Restrictions Precautions Precautions: None Precaution Comments: Direct anterior approach - no hip precautions Restrictions Weight Bearing Restrictions: Yes RLE Weight Bearing: Weight bearing as tolerated    Mobility  Bed Mobility Overal bed mobility: Needs Assistance Bed Mobility: Sit to Supine       Sit to supine: Min assist   General bed mobility comments: Min assist for RLE support into bed. VCs for technique and educated on how to use LLE to assist RLE into bed.  Transfers Overall transfer level: Needs assistance Equipment used: Rolling walker (2 wheeled) Transfers: Sit to/from Stand Sit to Stand: Min guard         General transfer comment: Min guard for safety. VCs for hand placement. No physical assist required. Performed from recliner and BSC.  Ambulation/Gait Ambulation/Gait assistance: Min guard Ambulation Distance (Feet): 100 Feet Assistive device: Rolling walker (2 wheeled) Gait Pattern/deviations: Step-through pattern;Decreased step length - left;Decreased stance time -  right;Antalgic   Gait velocity interpretation: Below normal speed for age/gender General Gait Details: Replaced walker with more appropriate size youth walker. Demonstrates improved stability. VCs for step length showing good step-through technique.   Stairs Stairs: Yes Stairs assistance: Min guard Stair Management: No rails;Step to pattern;Backwards;With walker Number of Stairs: 2 General stair comments: Demonstrated to pt prior to her performing. Backwards approach with min assist to block rolling walker. Perfoms this task safely however pt states she does not feel safe with this task. Requests to practice again tomorrow.  Wheelchair Mobility    Modified Rankin (Stroke Patients Only)       Balance                                    Cognition Arousal/Alertness: Awake/alert Behavior During Therapy: WFL for tasks assessed/performed Overall Cognitive Status: Within Functional Limits for tasks assessed                      Exercises      General Comments        Pertinent Vitals/Pain 7/10 pain Pt reports nursing has given pain medication just prior to starting pt session Patient repositioned in bed comfort.     Home Living                      Prior Function            PT Goals (current goals can now be found in the care plan section) Acute Rehab PT Goals PT Goal Formulation: With patient Time For Goal Achievement: 12/20/13 Potential to Achieve Goals:  Good Progress towards PT goals: Progressing toward goals    Frequency  7X/week    PT Plan Current plan remains appropriate;Equipment recommendations need to be updated    Co-evaluation             End of Session   Activity Tolerance: Patient tolerated treatment well Patient left: with call bell/phone within reach;in bed     Time: 1347-1415 PT Time Calculation (min): 28 min  Charges:  $Gait Training: 23-37 mins                    G Codes:      [employer], [place]  Ellouise Newer 12/13/2013, 2:35 PM

## 2013-12-13 NOTE — Care Management Note (Signed)
CARE MANAGEMENT NOTE 12/13/2013  Patient:  KELA, BACCARI   Account Number:  [id number]  Date Initiated:  12/13/2013  Documentation initiated by:  Ricki Miller  Subjective/Objective Assessment:   58 yr old female s/p right total hip arthroplasty.     Action/Plan:   Case manager spoke with patient concerning home health and DME needs at discharge. Patient was preoperatively setup with [facility] care, no changes. Has family support.   Anticipated DC Date:  12/14/2013   Anticipated DC Plan:  [place]  CM consult      PAC Choice  [place]   Choice offered to / List presented to:  C-1 Patient   DME arranged  [place]  3-N-1      DME agency  [employer]     [place] arranged  HH-2 PT      [place] agency  [facility]   Status of service:  Completed, signed off Medicare Important Message given?   (If response is "NO", the following Medicare IM given date fields will be blank) Date Medicare IM given:   Date Additional Medicare IM given:    Discharge Disposition:  [place]  Per UR Regulation:  Reviewed for med. necessity/level of care/duration of stay

## 2013-12-14 ENCOUNTER — Encounter (HOSPITAL_COMMUNITY): Payer: Self-pay | Admitting: Orthopaedic Surgery

## 2013-12-14 LAB — CBC
HCT: 28.5 % — ABNORMAL LOW (ref 36.0–46.0)
Hemoglobin: 9.3 g/dL — ABNORMAL LOW (ref 12.0–15.0)
MCH: 28.4 pg (ref 26.0–34.0)
MCHC: 32.6 g/dL (ref 30.0–36.0)
MCV: 86.9 fL (ref 78.0–100.0)
Platelets: 236 10*3/uL (ref 150–400)
RBC: 3.28 MIL/uL — ABNORMAL LOW (ref 3.87–5.11)
RDW: 14.6 % (ref 11.5–15.5)
WBC: 8.6 10*3/uL (ref 4.0–10.5)

## 2013-12-14 LAB — GLUCOSE, CAPILLARY
Glucose-Capillary: 132 mg/dL — ABNORMAL HIGH (ref 70–99)
Glucose-Capillary: 129 mg/dL — ABNORMAL HIGH (ref 70–99)
Glucose-Capillary: 122 mg/dL — ABNORMAL HIGH (ref 70–99)
Glucose-Capillary: 115 mg/dL — ABNORMAL HIGH (ref 70–99)

## 2013-12-14 NOTE — Progress Notes (Signed)
Physical Therapy Treatment Patient Details Name: [name] MRN: [id number] DOB: 1956/04/24 Today's Date: 12/14/2013    History of Present Illness 58 y.o. female s/p right total hip arthroplasty. Hx of HTN and DM.    PT Comments    Pt performing very well this afternoon. Ambulating up to 250 feet with supervision while using rolling walker. Educated on safe techniques for entering/exiting high bed similar to home environment. Will follow up with pt in AM to answer any further questions prior to d/c. Pt states she feels much more confident with mobility following PM session today. Patient will continue to benefit from skilled physical therapy services at home to further improve independence with functional mobility.   Follow Up Recommendations  Home health PT;Supervision for mobility/OOB     Equipment Recommendations  3in1 (PT);Rolling walker with 5" wheels (YOUTH walker)    Recommendations for Other Services OT consult     Precautions / Restrictions Precautions Precautions: None Precaution Comments: Direct anterior approach - no hip precautions Restrictions Weight Bearing Restrictions: Yes RLE Weight Bearing: Weight bearing as tolerated    Mobility  Bed Mobility Overal bed mobility: Needs Assistance Bed Mobility: Supine to Sit;Sit to Supine     Supine to sit: Supervision Sit to supine: Supervision   General bed mobility comments: Supervision for safety. Educated on use of LLE to assist RLE into/out-of bed safely. Pt did not require physical assist.  Transfers Overall transfer level: Needs assistance Equipment used: Rolling walker (2 wheeled) Transfers: Sit to/from Stand Sit to Stand: Supervision         General transfer comment: Supervision for safety. Educated on use of a step-stool for entering a higher bed at home. Performed this task safely. Also performed sit<>stand from lowest bed setting and recliner with no physical assist. Single instance to cue for  hand placement.  Ambulation/Gait Ambulation/Gait assistance: Supervision Ambulation Distance (Feet): 250 Feet Assistive device: Rolling walker (2 wheeled) Gait Pattern/deviations: Step-through pattern;Decreased stance time - right;Antalgic;Trunk flexed   Gait velocity interpretation: Below normal speed for age/gender General Gait Details: Occasional cues for upright posture and VCs to activate right glute max to improve gait mechanics. No loss of balance during bout.   Stairs            Wheelchair Mobility    Modified Rankin (Stroke Patients Only)       Balance                                    Cognition Arousal/Alertness: Awake/alert Behavior During Therapy: WFL for tasks assessed/performed Overall Cognitive Status: Within Functional Limits for tasks assessed                      Exercises      General Comments        Pertinent Vitals/Pain Pt reports increase in pain when sitting on bed during transfer training. Patient repositioned in chair for comfort.     Home Living Family/patient expects to be discharged to:: Private residence Living Arrangements: Spouse/significant other                  Prior Function            PT Goals (current goals can now be found in the care plan section) Acute Rehab PT Goals PT Goal Formulation: With patient Time For Goal Achievement: 12/20/13 Potential to Achieve Goals: Good Progress towards PT  goals: Progressing toward goals    Frequency  7X/week    PT Plan Current plan remains appropriate    Co-evaluation             End of Session   Activity Tolerance: Patient tolerated treatment well Patient left: with call bell/phone within reach;in chair;with family/visitor present     Time: 8110-3159 PT Time Calculation (min): 23 min  Charges:  $Gait Training: 8-22 mins $Therapeutic Activity: 8-22 mins                    G Codes:       [employer],  [place]  Ellouise Newer 12/14/2013, 4:15 PM

## 2013-12-14 NOTE — Progress Notes (Signed)
Subjective: 2 Days Post-Op Procedure(s) (LRB): RIGHT TOTAL HIP ARTHROPLASTY ANTERIOR APPROACH (Right) Patient reports pain as moderate.  Working well with therapy.  Objective: Vital signs in last 24 hours: Temp:  [97.8 F (36.6 C)-99.9 F (37.7 C)] 99.9 F (37.7 C) (06/25 0624) Pulse Rate:  [83-102] 102 (06/25 0624) Resp:  [16-18] 18 (06/25 0624) BP: (103-106)/(58-68) 103/61 mmHg (06/25 0624) SpO2:  [93 %-100 %] 93 % (06/25 0624)  Intake/Output from previous day: 06/24 0701 - 06/25 0700 In: 720 [P.O.:720] Out: -  Intake/Output this shift: Total I/O In: 240 [P.O.:240] Out: -    Recent Labs  12/13/13 0536 12/14/13 0515  HGB 10.2* 9.3*    Recent Labs  12/13/13 0536 12/14/13 0515  WBC 6.0 8.6  RBC 3.66* 3.28*  HCT 32.0* 28.5*  PLT 278 236    Recent Labs  12/13/13 0536  NA 137  K 4.3  CL 100  CO2 25  BUN 13  CREATININE 0.84  GLUCOSE 153*  CALCIUM 8.7   No results found for this basename: LABPT, INR,  in the last 72 hours  Sensation intact distally Intact pulses distally Dorsiflexion/Plantar flexion intact Incision: dressing C/D/I  Assessment/Plan: 2 Days Post-Op Procedure(s) (LRB): RIGHT TOTAL HIP ARTHROPLASTY ANTERIOR APPROACH (Right) Plan for discharge tomorrow  [name] 12/14/2013, 6:57 AM

## 2013-12-14 NOTE — Progress Notes (Signed)
Physical Therapy Treatment Patient Details Name: [name] MRN: [id number] DOB: [birth year] Today's Date: 12/14/2013    History of Present Illness 58 y.o. female s/p right total hip arthroplasty. Hx of HTN and DM.    PT Comments    Patient continues to progress quickly towards physical therapy goals. Ambulating up to 200 feet at supervision level while using rolling walker. Stair training completed again today and pt reports feeling much more confident with this task. Patient will continue to benefit from skilled physical therapy services at home to further improve independence with functional mobility. Will continue to follow until d/c.   Follow Up Recommendations  Home health PT;Supervision for mobility/OOB     Equipment Recommendations  3in1 (PT);Rolling walker with 5" wheels (YOUTH walker)    Recommendations for Other Services OT consult     Precautions / Restrictions Precautions Precautions: None Precaution Comments: Direct anterior approach - no hip precautions Restrictions Weight Bearing Restrictions: Yes RLE Weight Bearing: Weight bearing as tolerated    Mobility  Bed Mobility                  Transfers Overall transfer level: Needs assistance Equipment used: Rolling walker (2 wheeled) Transfers: Sit to/from Stand Sit to Stand: Supervision         General transfer comment: Supervision for safety with VCs for hand placement. Educated to not use RW when standing to prevent tipping. Performed from recliner x2.  Ambulation/Gait Ambulation/Gait assistance: Supervision Ambulation Distance (Feet): 200 Feet Assistive device: Rolling walker (2 wheeled) Gait Pattern/deviations: Step-through pattern;Decreased step length - left;Decreased stance time - right;Antalgic   Gait velocity interpretation: Below normal speed for age/gender General Gait Details: Demonstrating good stability and control of RW. VCs for step-through gait pattern and foward gaze. No  loss of balance.   Stairs Stairs: Yes Stairs assistance: Min assist Stair Management: No rails;Step to pattern;Backwards;With walker Number of Stairs: 2 (x2) General stair comments: Min assist to block RW only. Pt performing safely and able to teach back correct sequencing. Reports feeling much safer after second trial of training today.  Wheelchair Mobility    Modified Rankin (Stroke Patients Only)       Balance                                    Cognition Arousal/Alertness: Awake/alert Behavior During Therapy: WFL for tasks assessed/performed Overall Cognitive Status: Within Functional Limits for tasks assessed                      Exercises Total Joint Exercises Ankle Circles/Pumps: AROM;Both;10 reps;Seated Quad Sets: AROM;Both;10 reps;Seated Gluteal Sets: Strengthening;Both;10 reps;Seated Hip ABduction/ADduction: AAROM;Right;10 reps;Supine Straight Leg Raises: AAROM;Right;10 reps;Supine Long Arc Quad: AROM;Right;10 reps;Seated Marching in Standing: Strengthening;Both;10 reps;Standing    General Comments        Pertinent Vitals/Pain Pt reports pain as moderate Patient repositioned in chair for comfort.     Home Living                      Prior Function            PT Goals (current goals can now be found in the care plan section) Acute Rehab PT Goals PT Goal Formulation: With patient Time For Goal Achievement: 12/20/13 Potential to Achieve Goals: Good Progress towards PT goals: Progressing toward goals    Frequency  7X/week  PT Plan Current plan remains appropriate    Co-evaluation             End of Session   Activity Tolerance: Patient tolerated treatment well Patient left: with call bell/phone within reach;in chair     Time: 4599-7741 PT Time Calculation (min): 23 min  Charges:  $Gait Training: 8-22 mins $Therapeutic Exercise: 8-22 mins                    G Codes:      [employer],  [place]  Ellouise Newer 12/14/2013, 1:04 PM

## 2013-12-15 LAB — CBC
HCT: 26.6 % — ABNORMAL LOW (ref 36.0–46.0)
Hemoglobin: 8.6 g/dL — ABNORMAL LOW (ref 12.0–15.0)
MCH: 28.1 pg (ref 26.0–34.0)
MCHC: 32.3 g/dL (ref 30.0–36.0)
MCV: 86.9 fL (ref 78.0–100.0)
Platelets: 234 10*3/uL (ref 150–400)
RBC: 3.06 MIL/uL — ABNORMAL LOW (ref 3.87–5.11)
RDW: 14.9 % (ref 11.5–15.5)
WBC: 8.2 10*3/uL (ref 4.0–10.5)

## 2013-12-15 LAB — GLUCOSE, CAPILLARY: Glucose-Capillary: 93 mg/dL (ref 70–99)

## 2013-12-15 MED ORDER — METHOCARBAMOL 500 MG PO TABS: 500.0000 mg | ORAL_TABLET | Freq: Four times a day (QID) | ORAL | Status: DC | PRN

## 2013-12-15 MED ORDER — ASPIRIN 325 MG PO TBEC: 325.0000 mg | DELAYED_RELEASE_TABLET | Freq: Two times a day (BID) | ORAL | Status: DC

## 2013-12-15 MED ORDER — FERROUS SULFATE 325 (65 FE) MG PO TABS: 325.0000 mg | ORAL_TABLET | Freq: Three times a day (TID) | ORAL | Status: DC

## 2013-12-15 MED ORDER — OXYCODONE-ACETAMINOPHEN 5-325 MG PO TABS: 1.0000 | ORAL_TABLET | ORAL | Status: DC | PRN

## 2013-12-15 NOTE — Discharge Summary (Signed)
Patient ID: [name] MRN: [id number] DOB/AGE: [birth year] 58 y.o.  Admit date: 12/12/2013 Discharge date: 12/15/2013  Admission Diagnoses:  Principal Problem:   Arthritis of right hip Active Problems:   Status post THR (total hip replacement)   Discharge Diagnoses:  Same  Past Medical History  Diagnosis Date  . Hypertension   . Diabetes mellitus without complication   . Anemia 2006    required transfusion post TAH/BSO 05/2005  . GLA deficiency   . Glaucoma of both eyes     Surgeries: Procedure(s): RIGHT TOTAL HIP ARTHROPLASTY ANTERIOR APPROACH on 12/12/2013   Consultants:    Discharged Condition: Improved  Hospital Course: [name] is an 58 y.o. female who was admitted 12/12/2013 for operative treatment ofArthritis of right hip. Patient has severe unremitting pain that affects sleep, daily activities, and work/hobbies. After pre-op clearance the patient was taken to the operating room on 12/12/2013 and underwent  Procedure(s): RIGHT TOTAL HIP ARTHROPLASTY ANTERIOR APPROACH.    Patient was given perioperative antibiotics: Anti-infectives   Start     Dose/Rate Route Frequency Ordered Stop   12/12/13 2200  clindamycin (CLEOCIN) IVPB 600 mg     600 mg 100 mL/hr over 30 Minutes Intravenous Every 6 hours 12/12/13 1958 12/13/13 0539   12/12/13 0600  clindamycin (CLEOCIN) IVPB 900 mg     900 mg 100 mL/hr over 30 Minutes Intravenous On call to O.R. 12/11/13 1251 12/12/13 1600       Patient was given sequential compression devices, early ambulation, and chemoprophylaxis to prevent DVT.  Patient benefited maximally from hospital stay and there were no complications.    Recent vital signs: Patient Vitals for the past 24 hrs:  BP Temp Temp src Pulse Resp SpO2  12/15/13 0612 101/57 mmHg 98.8 F (37.1 C) Oral 89 16 96 %  12/15/13 0400 - - - - 16 -  12/15/13 0000 - - - - 16 -  12/14/13 2050 97/60 mmHg 98.9 F (37.2 C) Oral 92 20 95 %  12/14/13 2000 - - - - 16 -   12/14/13 1600 - - - - 18 -  12/14/13 1400 98/56 mmHg 99 F (37.2 C) - 91 16 94 %  12/14/13 1200 - - - - 18 -  12/14/13 0800 - - - - 18 -     Recent laboratory studies:  Recent Labs  12/13/13 0536 12/14/13 0515 12/15/13 0521  WBC 6.0 8.6 8.2  HGB 10.2* 9.3* 8.6*  HCT 32.0* 28.5* 26.6*  PLT 278 236 234  NA 137  --   --   K 4.3  --   --   CL 100  --   --   CO2 25  --   --   BUN 13  --   --   CREATININE 0.84  --   --   GLUCOSE 153*  --   --   CALCIUM 8.7  --   --      Discharge Medications:     Medication List    STOP taking these medications       HYDROcodone-acetaminophen 5-325 MG per tablet  Commonly known as:  NORCO/VICODIN      TAKE these medications       aspirin 325 MG EC tablet  Take 1 tablet (325 mg total) by mouth 2 (two) times daily after a meal.     bimatoprost 0.03 % ophthalmic solution  Commonly known as:  LUMIGAN  Place 1 drop into both eyes at bedtime.  bisacodyl 10 MG suppository  Commonly known as:  DULCOLAX  Place 1 suppository (10 mg total) rectally daily as needed for mild constipation or moderate constipation.     ferrous sulfate 325 (65 FE) MG tablet  Take 1 tablet (325 mg total) by mouth 3 (three) times daily after meals.     glimepiride 4 MG tablet  Commonly known as:  AMARYL  Take 4 mg by mouth daily with breakfast.     losartan 25 MG tablet  Commonly known as:  COZAAR  Take 25 mg by mouth daily.     metFORMIN 500 MG tablet  Commonly known as:  GLUCOPHAGE  Take 500 mg by mouth 2 (two) times daily with a meal.     methocarbamol 500 MG tablet  Commonly known as:  ROBAXIN  Take 1 tablet (500 mg total) by mouth every 6 (six) hours as needed for muscle spasms.     oxyCODONE-acetaminophen 5-325 MG per tablet  Commonly known as:  ROXICET  Take 1-2 tablets by mouth every 4 (four) hours as needed for severe pain.        Diagnostic Studies: Dg Chest 2 View  12/04/2013   CLINICAL DATA:  Preop hip replacement  EXAM: CHEST  2  VIEW  COMPARISON:  08/22/2004  FINDINGS: The heart size and mediastinal contours are within normal limits. Both lungs are clear. The visualized skeletal structures are unremarkable.  IMPRESSION: No active cardiopulmonary disease.   Electronically Signed   By: Franchot Gallo M.D.   On: 12/04/2013 15:58   Dg Hip Operative Right  12/12/2013   CLINICAL DATA:  Right anterior total hip replacement  EXAM: DG OPERATIVE RIGHT HIP  FLUOROSCOPY TIME:  1 min, 7 seconds  COMPARISON:  Right hip MRI- 04/27/2012  FINDINGS: Two spot intraoperative radiographs of the lower pelvis and right hip are provided for review.  Images demonstrate the sequela of right total hip replacement. Alignment appears anatomic on this anterior projection. No evidence of hardware failure or loosening. No definite fracture.  There is a minimal amount of expected subcutaneous emphysema about the operative site. No radiopaque foreign body.  Several phleboliths overlie the lower pelvis.  IMPRESSION: Post right total hip replacement without evidence of complication.   Electronically Signed   By: Sandi Mariscal M.D.   On: 12/12/2013 20:09   Dg Pelvis Portable  12/12/2013   CLINICAL DATA:  Post RIGHT hip surgery  EXAM: PORTABLE PELVIS 1-2 VIEWS  COMPARISON:  Portable exam 2002 hr compared to intraoperative images of 12/12/2013  FINDINGS: RIGHT hip prosthesis identified.  No fracture dislocation.  Osseous mineralization normal.  Scattered regional soft tissue changes.  Pelvis appears intact.  Focus of lucency at the proximal RIGHT femoral diaphysis is somewhat indistinct and is felt to represent superimposed soft tissue gas.  Multiple pelvic phleboliths.  IMPRESSION: RIGHT hip prosthesis without definite acute bony findings.   Electronically Signed   By: Lavonia Dana M.D.   On: 12/12/2013 20:33   Dg Hip Portable 1 View Right  12/12/2013   CLINICAL DATA:  Hip surgery  EXAM: PORTABLE RIGHT HIP - 1 VIEW  COMPARISON:  Portable exam 2008 hr compared to  intraoperative images of 12/12/2013  FINDINGS: RIGHT hip prosthesis identified without fracture dislocation.  No periprosthetic lucency or acute osseous findings.  IMPRESSION: RIGHT hip prosthesis without acute complication.   Electronically Signed   By: Lavonia Dana M.D.   On: 12/12/2013 20:36    Disposition: 01-Home or Self Care  Discharge Instructions   Discharge patient    Complete by:  As directed      Weight bearing as tolerated    Complete by:  As directed            Follow-up Information   Follow up with [facility]. (Someone from [facility] will contact you concerning start date and time for physical therapy.)    Contact information:   [address] [place] [address] [phone]       Follow up with Mcarthur Rossetti, MD In 2 weeks.   Specialty:  Orthopedic Surgery   Contact information:   [place] [address] [phone]        Signed: Mcarthur Rossetti 12/15/2013, 7:01 AM

## 2013-12-15 NOTE — Discharge Instructions (Signed)
Pickup stool softener for constipation. Weight Bearing as tolerated  No hip precautions Progress activities slowly Expect hip and possible knee soreness and swelling. Apply heat or ice as needed. Keep dressing clean dry and intact, may shower with dressing intact. On Tuesday remove dressing, incision can get wet. After showering apply clean dressing.

## 2013-12-15 NOTE — Progress Notes (Signed)
PT Cancellation Note  Patient Details Name: [name] MRN: [id number] DOB: 09-23-1955   Cancelled Treatment:    Reason Eval/Treat Not Completed: Patient declined, no reason specified  Pt reports she does not need any further acute PT at this time and is ready to go home. Will follow up if pt still here this afternoon for any further questions/concerns.  [place], [place]   Ellouise Newer 12/15/2013, 11:45 AM

## 2013-12-20 NOTE — Addendum Note (Signed)
Addendum created 12/20/13 1609 by Albertha Ghee, MD   Modules edited: Anesthesia Attestations

## 2014-01-25 ENCOUNTER — Other Ambulatory Visit: Payer: Self-pay

## 2014-01-25 DIAGNOSIS — Z1231 Encounter for screening mammogram for malignant neoplasm of breast: Secondary | ICD-10-CM

## 2014-02-09 ENCOUNTER — Ambulatory Visit: Payer: BC Managed Care – PPO

## 2014-02-28 ENCOUNTER — Ambulatory Visit
Admission: RE | Admit: 2014-02-28 | Discharge: 2014-02-28 | Disposition: A | Discharge status: Home / Self Care | Payer: BC Managed Care – PPO | Source: Ambulatory Visit

## 2014-02-28 DIAGNOSIS — Z1231 Encounter for screening mammogram for malignant neoplasm of breast: Secondary | ICD-10-CM

## 2014-04-06 ENCOUNTER — Other Ambulatory Visit: Payer: Self-pay

## 2014-05-13 ENCOUNTER — Emergency Department (HOSPITAL_COMMUNITY)
Admission: EM | Admit: 2014-05-13 | Discharge: 2014-05-13 | Disposition: A | Discharge status: Home / Self Care | Payer: BC Managed Care – PPO | Source: Home / Self Care | Attending: Family Medicine | Admitting: Family Medicine

## 2014-05-13 ENCOUNTER — Encounter (HOSPITAL_COMMUNITY): Payer: Self-pay | Admitting: Emergency Medicine

## 2014-05-13 DIAGNOSIS — M7662 Achilles tendinitis, left leg: Secondary | ICD-10-CM

## 2014-05-13 MED ORDER — ONDANSETRON HCL 4 MG PO TABS: 4.0000 mg | ORAL_TABLET | Freq: Three times a day (TID) | ORAL | Status: DC | PRN

## 2014-05-13 NOTE — ED Notes (Signed)
Pt states that she has been having some left heel pain since 05/11/2014 states that she was walking at work and that when the pain started pt denies any injury to foot

## 2014-05-13 NOTE — ED Provider Notes (Signed)
CSN: [id number]     Arrival date & time 05/13/14  1257 History   First MD Initiated Contact with Patient 05/13/14 1307     Chief Complaint  Patient presents with  . Foot Pain   (Consider location/radiation/quality/duration/timing/severity/associated sxs/prior Treatment) HPI Comments: Patient developed left heel and posterior ankle pain with ambulation beginning 05/11/2014. No reported injury and no previous episodes. Does endorse that she had right THR (Dr. Ninfa Linden) in June 2015 and has been walking with a cane since surgery. Began taking diclofenac yesterday for pain.   Patient is a 58 y.o. female presenting with lower extremity pain. The history is provided by the patient.  Foot Pain    Past Medical History  Diagnosis Date  . Hypertension   . Diabetes mellitus without complication   . Anemia 2006    required transfusion post TAH/BSO 05/2005  . GLA deficiency   . Glaucoma of both eyes    Past Surgical History  Procedure Laterality Date  . Total abdominal hysterectomy w/ bilateral salpingoophorectomy  05/2005  . Hysteroscopy w/d&c  01/2005    for uterine fibroids.   . Esophagogastroduodenoscopy N/A 08/22/2013    Procedure: ESOPHAGOGASTRODUODENOSCOPY (EGD);  Surgeon: Irene Shipper, MD;  Location: [facility] ENDOSCOPY;  Service: Endoscopy;  Laterality: N/A;  . Colonoscopy N/A 08/22/2013    Procedure: COLONOSCOPY;  Surgeon: Irene Shipper, MD;  Location: [place];  Service: Endoscopy;  Laterality: N/A;  . Ventral hernia repair N/A 08/24/2013    Procedure: HERNIA REPAIR VENTRAL ADULT;  Surgeon: Edward Jolly, MD;  Location: [place];  Service: General;  Laterality: N/A;  . Insertion of mesh N/A 08/24/2013    Procedure: INSERTION OF MESH;  Surgeon: Edward Jolly, MD;  Location: [place];  Service: General;  Laterality: N/A;  . Panniculectomy N/A 08/24/2013    Procedure: PANNICULECTOMY;  Surgeon: Edward Jolly, MD;  Location: [place];  Service: General;  Laterality: N/A;  . Total hip  arthroplasty Right 12/12/2013    Procedure: RIGHT TOTAL HIP ARTHROPLASTY ANTERIOR APPROACH;  Surgeon: Mcarthur Rossetti, MD;  Location: [place];  Service: Orthopedics;  Laterality: Right;   History reviewed. No pertinent family history. History  Substance Use Topics  . Smoking status: Never Smoker   . Smokeless tobacco: Never Used  . Alcohol Use: No   OB History    No data available     Review of Systems  All other systems reviewed and are negative.   Allergies  Penicillins  Home Medications   Prior to Admission medications   Medication Sig Start Date End Date Taking? Authorizing Provider  aspirin EC 325 MG EC tablet Take 1 tablet (325 mg total) by mouth 2 (two) times daily after a meal. 12/15/13   Erskine Emery, PA-C  bimatoprost (LUMIGAN) 0.03 % ophthalmic solution Place 1 drop into both eyes at bedtime.    Historical Provider, MD  bisacodyl (DULCOLAX) 10 MG suppository Place 1 suppository (10 mg total) rectally daily as needed for mild constipation or moderate constipation. 08/29/13   Megan N Dort, PA-C  ferrous sulfate 325 (65 FE) MG tablet Take 1 tablet (325 mg total) by mouth 3 (three) times daily after meals. 12/15/13   Erskine Emery, PA-C  glimepiride (AMARYL) 4 MG tablet Take 4 mg by mouth daily with breakfast.    Historical Provider, MD  losartan (COZAAR) 25 MG tablet Take 25 mg by mouth daily.    Historical Provider, MD  metFORMIN (GLUCOPHAGE) 500 MG tablet Take 500 mg by  mouth 2 (two) times daily with a meal.    Historical Provider, MD  methocarbamol (ROBAXIN) 500 MG tablet Take 1 tablet (500 mg total) by mouth every 6 (six) hours as needed for muscle spasms. 12/15/13   Erskine Emery, PA-C  ondansetron (ZOFRAN) 4 MG tablet Take 1 tablet (4 mg total) by mouth every 8 (eight) hours as needed for nausea. 05/13/14   Audelia Hives Catarino Vold, PA  oxyCODONE-acetaminophen (ROXICET) 5-325 MG per tablet Take 1-2 tablets by mouth every 4 (four) hours as needed for severe pain. 12/15/13    Erskine Emery, PA-C   BP 145/77 mmHg  Pulse 93  Temp(Src) 98.4 F (36.9 C) (Oral)  Resp 16  SpO2 99% Physical Exam  Constitutional: She is oriented to person, place, and time. She appears well-developed and well-nourished. No distress.  HENT:  Head: Normocephalic and atraumatic.  Eyes: Conjunctivae are normal.  Cardiovascular: Normal rate.   Pulmonary/Chest: Effort normal.  Musculoskeletal: Normal range of motion.       Feet:  Neurological: She is alert and oriented to person, place, and time.  Skin: Skin is warm and dry. No rash noted. No erythema.  Psychiatric: She has a normal mood and affect. Her behavior is normal.  Nursing note and vitals reviewed.   ED Course  Procedures (including critical care time) Labs Review Labs Reviewed - No data to display  Imaging Review No results found.   MDM   1. Tendonitis, Achilles, left     Advised ice, elevation and continued use of diclofenac for pain. Patient reports that occasionally diclofenac makes her nauseous. Will write limited Rx for zofran for this Patient instructed regarding stretch and strength exercises for this condition.  If no improvement, advised follow up with her orthopedist (Dr. Ninfa Linden)    Lutricia Feil, [place] 05/13/14 1335

## 2014-05-13 NOTE — Discharge Instructions (Signed)
Achilles Tendinitis   with Rehab  Achilles tendinitis is a disorder of the Achilles tendon. The Achilles tendon connects the large calf muscles (Gastrocnemius and Soleus) to the heel bone (calcaneus). This tendon is sometimes called the heel cord. It is important for pushing-off and standing on your toes and is important for walking, running, or jumping. Tendinitis is often caused by overuse and repetitive microtrauma.  SYMPTOMS  · Pain, tenderness, swelling, warmth, and redness may occur over the Achilles tendon even at rest.  · Pain with pushing off, or flexing or extending the ankle.  · Pain that is worsened after or during activity.  CAUSES   · Overuse sometimes seen with rapid increase in exercise programs or in sports requiring running and jumping.  · Poor physical conditioning (strength and flexibility or endurance).  · Running sports, especially training running down hills.  · Inadequate warm-up before practice or play or failure to stretch before participation.  · Injury to the tendon.  PREVENTION   · Warm up and stretch before practice or competition.  · Allow time for adequate rest and recovery between practices and competition.  · Keep up conditioning.  ¨ Keep up ankle and leg flexibility.  ¨ Improve or keep muscle strength and endurance.  ¨ Improve cardiovascular fitness.  · Use proper technique.  · Use proper equipment (shoes, skates).  · To help prevent recurrence, taping, protective strapping, or an adhesive bandage may be recommended for several weeks after healing is complete.  PROGNOSIS   · Recovery may take weeks to several months to heal.  · Longer recovery is expected if symptoms have been prolonged.  · Recovery is usually quicker if the inflammation is due to a direct blow as compared with overuse or sudden strain.  RELATED COMPLICATIONS   · Healing time will be prolonged if the condition is not correctly treated. The injury must be given plenty of time to heal.  · Symptoms can reoccur if  activity is resumed too soon.  · Untreated, tendinitis may increase the risk of tendon rupture requiring additional time for recovery and possibly surgery.  TREATMENT   · The first treatment consists of rest anti-inflammatory medication, and ice to relieve the pain.  · Stretching and strengthening exercises after resolution of pain will likely help reduce the risk of recurrence. Referral to a physical therapist or athletic trainer for further evaluation and treatment may be helpful.  · A walking boot or cast may be recommended to rest the Achilles tendon. This can help break the cycle of inflammation and microtrauma.  · Arch supports (orthotics) may be prescribed or recommended by your caregiver as an adjunct to therapy and rest.  · Surgery to remove the inflamed tendon lining or degenerated tendon tissue is rarely necessary and has shown less than predictable results.  MEDICATION   · Nonsteroidal anti-inflammatory medications, such as aspirin and ibuprofen, may be used for pain and inflammation relief. Do not take within 7 days before surgery. Take these as directed by your caregiver. Contact your caregiver immediately if any bleeding, stomach upset, or signs of allergic reaction occur. Other minor pain relievers, such as acetaminophen, may also be used.  · Pain relievers may be prescribed as necessary by your caregiver. Do not take prescription pain medication for longer than 4 to 7 days. Use only as directed and only as much as you need.  · Cortisone injections are rarely indicated. Cortisone injections may weaken tendons and predispose to rupture. It is better   to give the condition more time to heal than to use them.  HEAT AND COLD  · Cold is used to relieve pain and reduce inflammation for acute and chronic Achilles tendinitis. Cold should be applied for 10 to 15 minutes every 2 to 3 hours for inflammation and pain and immediately after any activity that aggravates your symptoms. Use ice packs or an ice  massage.  · Heat may be used before performing stretching and strengthening activities prescribed by your caregiver. Use a heat pack or a warm soak.  SEEK MEDICAL CARE IF:  · Symptoms get worse or do not improve in 2 weeks despite treatment.  · New, unexplained symptoms develop. Drugs used in treatment may produce side effects.  EXERCISES  RANGE OF MOTION (ROM) AND STRETCHING EXERCISES - Achilles Tendinitis   These exercises may help you when beginning to rehabilitate your injury. Your symptoms may resolve with or without further involvement from your physician, physical therapist or athletic trainer. While completing these exercises, remember:   · Restoring tissue flexibility helps normal motion to return to the joints. This allows healthier, less painful movement and activity.  · An effective stretch should be held for at least 30 seconds.  · A stretch should never be painful. You should only feel a gentle lengthening or release in the stretched tissue.  STRETCH - Gastroc, Standing   · Place hands on wall.  · Extend right / left leg, keeping the front knee somewhat bent.  · Slightly point your toes inward on your back foot.  · Keeping your right / left heel on the floor and your knee straight, shift your weight toward the wall, not allowing your back to arch.  · You should feel a gentle stretch in the right / left calf. Hold this position for __________ seconds.  Repeat __________ times. Complete this stretch __________ times per day.  STRETCH - Soleus, Standing   · Place hands on wall.  · Extend right / left leg, keeping the other knee somewhat bent.  · Slightly point your toes inward on your back foot.  · Keep your right / left heel on the floor, bend your back knee, and slightly shift your weight over the back leg so that you feel a gentle stretch deep in your back calf.  · Hold this position for __________ seconds.  Repeat __________ times. Complete this stretch __________ times per day.  STRETCH -  Gastrocsoleus, Standing   Note: This exercise can place a lot of stress on your foot and ankle. Please complete this exercise only if specifically instructed by your caregiver.   · Place the ball of your right / left foot on a step, keeping your other foot firmly on the same step.  · Hold on to the wall or a rail for balance.  · Slowly lift your other foot, allowing your body weight to press your heel down over the edge of the step.  · You should feel a stretch in your right / left calf.  · Hold this position for __________ seconds.  · Repeat this exercise with a slight bend in your knee.  Repeat __________ times. Complete this stretch __________ times per day.   STRENGTHENING EXERCISES - Achilles Tendinitis  These exercises may help you when beginning to rehabilitate your injury. They may resolve your symptoms with or without further involvement from your physician, physical therapist or athletic trainer. While completing these exercises, remember:   · Muscles can gain both the endurance   and the strength needed for everyday activities through controlled exercises.  · Complete these exercises as instructed by your physician, physical therapist or athletic trainer. Progress the resistance and repetitions only as guided.  · You may experience muscle soreness or fatigue, but the pain or discomfort you are trying to eliminate should never worsen during these exercises. If this pain does worsen, stop and make certain you are following the directions exactly. If the pain is still present after adjustments, discontinue the exercise until you can discuss the trouble with your clinician.  STRENGTH - Plantar-flexors   · Sit with your right / left leg extended. Holding onto both ends of a rubber exercise band/tubing, loop it around the ball of your foot. Keep a slight tension in the band.  · Slowly push your toes away from you, pointing them downward.  · Hold this position for __________ seconds. Return slowly, controlling the  tension in the band/tubing.  Repeat __________ times. Complete this exercise __________ times per day.   STRENGTH - Plantar-flexors   · Stand with your feet shoulder width apart. Steady yourself with a wall or table using as little support as needed.  · Keeping your weight evenly spread over the width of your feet, rise up on your toes.*  · Hold this position for __________ seconds.  Repeat __________ times. Complete this exercise __________ times per day.   *If this is too easy, shift your weight toward your right / left leg until you feel challenged. Ultimately, you may be asked to do this exercise with your right / left foot only.  STRENGTH - Plantar-flexors, Eccentric   Note: This exercise can place a lot of stress on your foot and ankle. Please complete this exercise only if specifically instructed by your caregiver.   · Place the balls of your feet on a step. With your hands, use only enough support from a wall or rail to keep your balance.  · Keep your knees straight and rise up on your toes.  · Slowly shift your weight entirely to your right / left toes and pick up your opposite foot. Gently and with controlled movement, lower your weight through your right / left foot so that your heel drops below the level of the step. You will feel a slight stretch in the back of your calf at the end position.  · Use the healthy leg to help rise up onto the balls of both feet, then lower weight only on the right / left leg again. Build up to 15 repetitions. Then progress to 3 consecutive sets of 15 repetitions.*  · After completing the above exercise, complete the same exercise with a slight knee bend (about 30 degrees). Again, build up to 15 repetitions. Then progress to 3 consecutive sets of 15 repetitions.*  Perform this exercise __________ times per day.   *When you easily complete 3 sets of 15, your physician, physical therapist or athletic trainer may advise you to add resistance by wearing a backpack filled with  additional weight.  STRENGTH - Plantar Flexors, Seated   · Sit on a chair that allows your feet to rest flat on the ground. If necessary, sit at the edge of the chair.  · Keeping your toes firmly on the ground, lift your right / left heel as far as you can without increasing any discomfort in your ankle.  Repeat __________ times. Complete this exercise __________ times a day.  *If instructed by your physician, physical therapist or athletic   trainer, you may add ____________________ of resistance by placing a weighted object on your right / left knee.  Document Released: 01/07/2005 Document Revised: 08/31/2011 Document Reviewed: 09/20/2008  [employer]® Patient Information ©2015 [employer]. This information is not intended to replace advice given to you by your health care provider. Make sure you discuss any questions you have with your health care provider.

## 2014-05-16 ENCOUNTER — Emergency Department (HOSPITAL_COMMUNITY)
Admission: EM | Admit: 2014-05-16 | Discharge: 2014-05-16 | Disposition: A | Discharge status: Home / Self Care | Payer: BC Managed Care – PPO | Source: Home / Self Care | Attending: Emergency Medicine | Admitting: Emergency Medicine

## 2014-05-16 ENCOUNTER — Encounter (HOSPITAL_COMMUNITY): Payer: Self-pay

## 2014-05-16 DIAGNOSIS — T7840XA Allergy, unspecified, initial encounter: Secondary | ICD-10-CM

## 2014-05-16 DIAGNOSIS — T783XXA Angioneurotic edema, initial encounter: Secondary | ICD-10-CM

## 2014-05-16 MED ORDER — CETIRIZINE HCL 10 MG PO TABS: 10.0000 mg | ORAL_TABLET | Freq: Two times a day (BID) | ORAL | Status: DC

## 2014-05-16 MED ORDER — PREDNISONE 20 MG PO TABS
ORAL_TABLET | ORAL | Status: AC
  Filled 2014-05-16: qty 2

## 2014-05-16 MED ORDER — PREDNISONE 20 MG PO TABS: 40.0000 mg | ORAL_TABLET | Freq: Every day | ORAL | Status: DC

## 2014-05-16 MED ORDER — RANITIDINE HCL 150 MG PO CAPS: 150.0000 mg | ORAL_CAPSULE | Freq: Two times a day (BID) | ORAL | Status: DC

## 2014-05-16 MED ORDER — PREDNISONE 20 MG PO TABS
40.0000 mg | ORAL_TABLET | Freq: Once | ORAL | Status: AC
  Administered 2014-05-16: 40 mg via ORAL

## 2014-05-16 NOTE — ED Notes (Signed)
started on diclofenac 11-22, since then has developed swelling, itching. LD yesterday. NAD. Minimal relief w benadryl

## 2014-05-16 NOTE — ED Provider Notes (Signed)
CSN: [id number]     Arrival date & time 05/16/14  0804 History   First MD Initiated Contact with Patient 05/16/14 [phone]     Chief Complaint  Patient presents with  . Allergic Reaction   (Consider location/radiation/quality/duration/timing/severity/associated sxs/prior Treatment) HPI          58 year old female presents for evaluation of allergic reaction. Since yesterday, she has an itchy rash on her arms and swelling of the right side of her face. This has been getting better because she has been taking Benadryl every 4 hours but it continues to come back. Today the entire right side of her face was swollen when she woke up. She denies any swelling of her throat or tongue, difficulty breathing, chest tightness, wheezing, NVD. No dizziness or lightheadedness. She just started taking diclofenac a few days ago for the first time, she assumes that is the source of her allergic reaction. No numbness or weakness. She has diabetes, last A1c was 6.0.  Past Medical History  Diagnosis Date  . Hypertension   . Diabetes mellitus without complication   . Anemia 2006    required transfusion post TAH/BSO 05/2005  . GLA deficiency   . Glaucoma of both eyes    Past Surgical History  Procedure Laterality Date  . Total abdominal hysterectomy w/ bilateral salpingoophorectomy  05/2005  . Hysteroscopy w/d&c  01/2005    for uterine fibroids.   . Esophagogastroduodenoscopy N/A 08/22/2013    Procedure: ESOPHAGOGASTRODUODENOSCOPY (EGD);  Surgeon: Irene Shipper, MD;  Location: [facility] ENDOSCOPY;  Service: Endoscopy;  Laterality: N/A;  . Colonoscopy N/A 08/22/2013    Procedure: COLONOSCOPY;  Surgeon: Irene Shipper, MD;  Location: [place];  Service: Endoscopy;  Laterality: N/A;  . Ventral hernia repair N/A 08/24/2013    Procedure: HERNIA REPAIR VENTRAL ADULT;  Surgeon: Edward Jolly, MD;  Location: [place];  Service: General;  Laterality: N/A;  . Insertion of mesh N/A 08/24/2013    Procedure: INSERTION OF MESH;   Surgeon: Edward Jolly, MD;  Location: [place];  Service: General;  Laterality: N/A;  . Panniculectomy N/A 08/24/2013    Procedure: PANNICULECTOMY;  Surgeon: Edward Jolly, MD;  Location: [place];  Service: General;  Laterality: N/A;  . Total hip arthroplasty Right 12/12/2013    Procedure: RIGHT TOTAL HIP ARTHROPLASTY ANTERIOR APPROACH;  Surgeon: Mcarthur Rossetti, MD;  Location: [place];  Service: Orthopedics;  Laterality: Right;   History reviewed. No pertinent family history. History  Substance Use Topics  . Smoking status: Never Smoker   . Smokeless tobacco: Never Used  . Alcohol Use: No   OB History    No data available     Review of Systems  Constitutional: Negative for fever and chills.  HENT: Positive for facial swelling.   Skin: Positive for rash.  Neurological: Negative for dizziness, facial asymmetry, speech difficulty, weakness, light-headedness and numbness.  All other systems reviewed and are negative.   Allergies  Diclofenac and Penicillins  Home Medications   Prior to Admission medications   Medication Sig Start Date End Date Taking? Authorizing Provider  aspirin EC 325 MG EC tablet Take 1 tablet (325 mg total) by mouth 2 (two) times daily after a meal. 12/15/13   Erskine Emery, PA-C  bimatoprost (LUMIGAN) 0.03 % ophthalmic solution Place 1 drop into both eyes at bedtime.    Historical Provider, MD  bisacodyl (DULCOLAX) 10 MG suppository Place 1 suppository (10 mg total) rectally daily as needed for mild constipation or  moderate constipation. 08/29/13   Megan N Dort, PA-C  cetirizine (ZYRTEC) 10 MG tablet Take 1 tablet (10 mg total) by mouth 2 (two) times daily. 05/16/14   Liam Graham, PA-C  ferrous sulfate 325 (65 FE) MG tablet Take 1 tablet (325 mg total) by mouth 3 (three) times daily after meals. 12/15/13   Erskine Emery, PA-C  glimepiride (AMARYL) 4 MG tablet Take 4 mg by mouth daily with breakfast.    Historical Provider, MD  losartan (COZAAR) 25  MG tablet Take 25 mg by mouth daily.    Historical Provider, MD  metFORMIN (GLUCOPHAGE) 500 MG tablet Take 500 mg by mouth 2 (two) times daily with a meal.    Historical Provider, MD  methocarbamol (ROBAXIN) 500 MG tablet Take 1 tablet (500 mg total) by mouth every 6 (six) hours as needed for muscle spasms. 12/15/13   Erskine Emery, PA-C  ondansetron (ZOFRAN) 4 MG tablet Take 1 tablet (4 mg total) by mouth every 8 (eight) hours as needed for nausea. 05/13/14   Audelia Hives Presson, PA  oxyCODONE-acetaminophen (ROXICET) 5-325 MG per tablet Take 1-2 tablets by mouth every 4 (four) hours as needed for severe pain. 12/15/13   Erskine Emery, PA-C  predniSONE (DELTASONE) 20 MG tablet Take 2 tablets (40 mg total) by mouth daily with breakfast. Starting tomorrow 05/16/14   Liam Graham, PA-C  ranitidine (ZANTAC) 150 MG capsule Take 1 capsule (150 mg total) by mouth 2 (two) times daily. 05/16/14   Freeman Caldron Tekeshia Klahr, PA-C   BP 141/83 mmHg  Pulse 65  Temp(Src) 98.8 F (37.1 C) (Oral)  SpO2 98% Physical Exam  Constitutional: She is oriented to person, place, and time. Vital signs are normal. She appears well-developed and well-nourished. No distress.  HENT:  Head: Normocephalic and atraumatic.  Mouth/Throat: Uvula is midline, oropharynx is clear and moist and mucous membranes are normal. No uvula swelling.  Very slight swelling of the right upper lip  Eyes: Conjunctivae are normal. Right eye exhibits no discharge. Left eye exhibits no discharge.  Neck: Normal range of motion. Neck supple.  Cardiovascular: Normal rate, regular rhythm and normal heart sounds.   Pulmonary/Chest: Effort normal and breath sounds normal. No respiratory distress. She has no wheezes.  Lymphadenopathy:    She has no cervical adenopathy.  Neurological: She is alert and oriented to person, place, and time. She has normal strength. No cranial nerve deficit or sensory deficit. She exhibits normal muscle tone. Coordination and gait  normal. GCS eye subscore is 4. GCS verbal subscore is 5. GCS motor subscore is 6.  Stroke screen negative. Normal gait  Skin: Skin is warm and dry. Rash noted. Rash is urticarial (on the dorsum of the right hand). She is not diaphoretic.  Psychiatric: She has a normal mood and affect. Judgment normal.  Nursing note and vitals reviewed.   ED Course  Procedures (including critical care time) Labs Review Labs Reviewed - No data to display  Imaging Review No results found.   MDM   1. Allergic reaction, initial encounter   2. Angioedema, initial encounter    She has diabetes but it is well controlled, will use a moderate dose of prednisone today and tomorrow. Also twice a day Zyrtec and Zantac. Return precautions discussed with the patient    Meds ordered this encounter  Medications  . predniSONE (DELTASONE) tablet 40 mg    Sig:   . predniSONE (DELTASONE) 20 MG tablet    Sig: Take 2 tablets (  40 mg total) by mouth daily with breakfast. Starting tomorrow    Dispense:  2 tablet    Refill:  0    Order Specific Question:  Supervising Provider    Answer:  Billy Fischer [phone]  . cetirizine (ZYRTEC) 10 MG tablet    Sig: Take 1 tablet (10 mg total) by mouth 2 (two) times daily.    Dispense:  20 tablet    Refill:  0    Order Specific Question:  Supervising Provider    Answer:  Billy Fischer [phone]  . ranitidine (ZANTAC) 150 MG capsule    Sig: Take 1 capsule (150 mg total) by mouth 2 (two) times daily.    Dispense:  20 capsule    Refill:  0    Order Specific Question:  Supervising Provider    Answer:  Ihor Gully D Rankin, PA-C 05/16/14 [phone]

## 2014-05-16 NOTE — Discharge Instructions (Signed)
Angioedema Angioedema is sudden puffiness (swelling), often of the skin. It can happen:  On your face or privates (genitals).  In your belly (abdomen) or other body parts. It usually happens quickly and gets better in 1 or 2 days. It often starts at night and is found when you wake up. You may get red, itchy patches of skin (hives). Attacks can be dangerous if your breathing passages get puffy. The condition may happen only once, or it can come back at random times. It may happen for several years before it goes away for good. HOME CARE  Only take medicines as told by your doctor.  Always carry your emergency allergy medicines with you.  Wear a medical bracelet as told by your doctor.  Avoid things that you know will cause attacks (triggers). GET HELP IF:  You have another attack.  Your attacks happen more often or get worse.  The condition was passed to you by your parents and you want to have children. GET HELP RIGHT AWAY IF:   Your mouth, tongue, or lips are very puffy.  You have trouble breathing.  You have trouble swallowing.  You pass out (faint). MAKE SURE YOU:   Understand these instructions.  Will watch your condition.  Will get help right away if you are not doing well or get worse. Document Released: 05/27/2009 Document Revised: 03/29/2013 Document Reviewed: 01/30/2013 [facility] Patient Information 2015 [place], [place]. This information is not intended to replace advice given to you by your health care provider. Make sure you discuss any questions you have with your health care provider.  Allergies  Allergies may happen from anything your body is sensitive to. This may be food, medicines, pollens, chemicals, and many other things. Food allergies can be severe and deadly.  HOME CARE  If you do not know what causes a reaction, keep a diary. Write down the foods you ate and the symptoms that followed. Avoid foods that cause reactions.  If you have red raised  spots (hives) or a rash:  Take medicine as told by your doctor.  Use medicines for red raised spots and itching as needed.  Apply cold cloths (compresses) to the skin. Take a cool bath. Avoid hot baths or showers.  If you are severely allergic:  It is often necessary to go to the hospital after you have treated your reaction.  Wear your medical alert jewelry.  You and your family must learn how to give a allergy shot or use an allergy kit (anaphylaxis kit).  Always carry your allergy kit or shot with you. Use this medicine as told by your doctor if a severe reaction is occurring. GET HELP RIGHT AWAY IF:  You have trouble breathing or are making high-pitched whistling sounds (wheezing).  You have a tight feeling in your chest or throat.  You have a puffy (swollen) mouth.  You have red raised spots, puffiness (swelling), or itching all over your body.  You have had a severe reaction that was helped by your allergy kit or shot. The reaction can return once the medicine has worn off.  You think you are having a food allergy. Symptoms most often happen within 30 minutes of eating a food.  Your symptoms have not gone away within 2 days or are getting worse.  You have new symptoms.  You want to retest yourself with a food or drink you think causes an allergic reaction. Only do this under the care of a doctor. MAKE SURE YOU:  Understand these instructions.  Will watch your condition.  Will get help right away if you are not doing well or get worse. Document Released: 10/03/2012 Document Reviewed: 10/03/2012 [facility] Patient Information 2015 [place]. This information is not intended to replace advice given to you by your health care provider. Make sure you discuss any questions you have with your health care provider.

## 2014-07-04 ENCOUNTER — Encounter (HOSPITAL_COMMUNITY): Payer: Self-pay | Admitting: *Deleted

## 2014-07-04 ENCOUNTER — Emergency Department (HOSPITAL_COMMUNITY)
Admission: EM | Admit: 2014-07-04 | Discharge: 2014-07-04 | Disposition: A | Discharge status: Home / Self Care | Payer: BC Managed Care – PPO | Attending: Emergency Medicine | Admitting: Emergency Medicine

## 2014-07-04 ENCOUNTER — Emergency Department (HOSPITAL_COMMUNITY)
Admission: EM | Admit: 2014-07-04 | Discharge: 2014-07-04 | Disposition: A | Discharge status: Other Acute Inpatient Hospital | Payer: BC Managed Care – PPO | Source: Home / Self Care | Attending: Emergency Medicine | Admitting: Emergency Medicine

## 2014-07-04 ENCOUNTER — Encounter (HOSPITAL_COMMUNITY): Payer: Self-pay

## 2014-07-04 ENCOUNTER — Emergency Department (HOSPITAL_COMMUNITY): Payer: BC Managed Care – PPO

## 2014-07-04 DIAGNOSIS — Z90710 Acquired absence of both cervix and uterus: Secondary | ICD-10-CM | POA: Insufficient documentation

## 2014-07-04 DIAGNOSIS — H409 Unspecified glaucoma: Secondary | ICD-10-CM | POA: Diagnosis not present

## 2014-07-04 DIAGNOSIS — R109 Unspecified abdominal pain: Secondary | ICD-10-CM

## 2014-07-04 DIAGNOSIS — Z880 Allergy status to penicillin: Secondary | ICD-10-CM | POA: Diagnosis not present

## 2014-07-04 DIAGNOSIS — E119 Type 2 diabetes mellitus without complications: Secondary | ICD-10-CM | POA: Insufficient documentation

## 2014-07-04 DIAGNOSIS — R0981 Nasal congestion: Secondary | ICD-10-CM | POA: Diagnosis not present

## 2014-07-04 DIAGNOSIS — I10 Essential (primary) hypertension: Secondary | ICD-10-CM | POA: Insufficient documentation

## 2014-07-04 DIAGNOSIS — R10A1 Flank pain, right side: Secondary | ICD-10-CM

## 2014-07-04 DIAGNOSIS — Z79899 Other long term (current) drug therapy: Secondary | ICD-10-CM | POA: Diagnosis not present

## 2014-07-04 DIAGNOSIS — J069 Acute upper respiratory infection, unspecified: Secondary | ICD-10-CM

## 2014-07-04 DIAGNOSIS — D649 Anemia, unspecified: Secondary | ICD-10-CM | POA: Insufficient documentation

## 2014-07-04 DIAGNOSIS — R1011 Right upper quadrant pain: Secondary | ICD-10-CM | POA: Diagnosis not present

## 2014-07-04 LAB — CBC WITH DIFFERENTIAL/PLATELET
Basophils Absolute: 0 10*3/uL (ref 0.0–0.1)
Basophils Relative: 1 % (ref 0–1)
Eosinophils Absolute: 0.5 10*3/uL (ref 0.0–0.7)
Eosinophils Relative: 9 % — ABNORMAL HIGH (ref 0–5)
HCT: 39.6 % (ref 36.0–46.0)
Hemoglobin: 13 g/dL (ref 12.0–15.0)
Lymphocytes Relative: 24 % (ref 12–46)
Lymphs Abs: 1.4 10*3/uL (ref 0.7–4.0)
MCH: 27.9 pg (ref 26.0–34.0)
MCHC: 32.8 g/dL (ref 30.0–36.0)
MCV: 85 fL (ref 78.0–100.0)
Monocytes Absolute: 0.5 10*3/uL (ref 0.1–1.0)
Monocytes Relative: 8 % (ref 3–12)
Neutro Abs: 3.4 10*3/uL (ref 1.7–7.7)
Neutrophils Relative %: 58 % (ref 43–77)
Platelets: 304 10*3/uL (ref 150–400)
RBC: 4.66 MIL/uL (ref 3.87–5.11)
RDW: 14.6 % (ref 11.5–15.5)
WBC: 5.8 10*3/uL (ref 4.0–10.5)

## 2014-07-04 LAB — COMPREHENSIVE METABOLIC PANEL
ALT: 14 U/L (ref 0–35)
AST: 22 U/L (ref 0–37)
Albumin: 4 g/dL (ref 3.5–5.2)
Alkaline Phosphatase: 90 U/L (ref 39–117)
Anion gap: 11 (ref 5–15)
BUN: 11 mg/dL (ref 6–23)
CO2: 25 mmol/L (ref 19–32)
Calcium: 9.7 mg/dL (ref 8.4–10.5)
Chloride: 103 mEq/L (ref 96–112)
Creatinine, Ser: 0.93 mg/dL (ref 0.50–1.10)
GFR calc Af Amer: 78 mL/min — ABNORMAL LOW (ref >90)
GFR calc non Af Amer: 67 mL/min — ABNORMAL LOW (ref >90)
Glucose, Bld: 95 mg/dL (ref 70–99)
Potassium: 4.4 mmol/L (ref 3.5–5.1)
Sodium: 139 mmol/L (ref 135–145)
Total Bilirubin: 1 mg/dL (ref 0.3–1.2)
Total Protein: 7 g/dL (ref 6.0–8.3)

## 2014-07-04 LAB — POCT URINALYSIS DIP (DEVICE)
Bilirubin Urine: NEGATIVE
Glucose, UA: NEGATIVE mg/dL
Hgb urine dipstick: NEGATIVE
Ketones, ur: NEGATIVE mg/dL
Leukocytes, UA: NEGATIVE
Nitrite: NEGATIVE
Protein, ur: NEGATIVE mg/dL
Specific Gravity, Urine: 1.01 (ref 1.005–1.030)
Urobilinogen, UA: 0.2 mg/dL (ref 0.0–1.0)
pH: 6 (ref 5.0–8.0)

## 2014-07-04 LAB — URINALYSIS, ROUTINE W REFLEX MICROSCOPIC
Bilirubin Urine: NEGATIVE
Glucose, UA: NEGATIVE mg/dL
Hgb urine dipstick: NEGATIVE
Ketones, ur: NEGATIVE mg/dL
Leukocytes, UA: NEGATIVE
Nitrite: NEGATIVE
Protein, ur: NEGATIVE mg/dL
Specific Gravity, Urine: 1.006 (ref 1.005–1.030)
Urobilinogen, UA: 0.2 mg/dL (ref 0.0–1.0)
pH: 6 (ref 5.0–8.0)

## 2014-07-04 LAB — LIPASE, BLOOD: Lipase: 25 U/L (ref 11–59)

## 2014-07-04 MED ORDER — SODIUM CHLORIDE 0.9 % IV BOLUS (SEPSIS)
250.0000 mL | Freq: Once | INTRAVENOUS | Status: AC
  Administered 2014-07-04: 250 mL via INTRAVENOUS

## 2014-07-04 MED ORDER — FLUTICASONE PROPIONATE 50 MCG/ACT NA SUSP: 2.0000 | Freq: Every day | NASAL | Status: DC

## 2014-07-04 MED ORDER — ONDANSETRON HCL 4 MG/2ML IJ SOLN
4.0000 mg | Freq: Once | INTRAMUSCULAR | Status: AC
  Administered 2014-07-04: 4 mg via INTRAVENOUS
  Filled 2014-07-04: qty 2

## 2014-07-04 MED ORDER — TRAMADOL HCL 50 MG PO TABS: 50.0000 mg | ORAL_TABLET | Freq: Four times a day (QID) | ORAL | Status: DC | PRN

## 2014-07-04 MED ORDER — HYDROMORPHONE HCL 1 MG/ML IJ SOLN
1.0000 mg | Freq: Once | INTRAMUSCULAR | Status: AC
  Administered 2014-07-04: 1 mg via INTRAVENOUS
  Filled 2014-07-04: qty 1

## 2014-07-04 MED ORDER — SODIUM CHLORIDE 0.9 % IV SOLN
INTRAVENOUS | Status: DC
  Administered 2014-07-04: 14:00:00 via INTRAVENOUS

## 2014-07-04 NOTE — ED Provider Notes (Signed)
CSN: [id number]     Arrival date & time 07/04/14  1235 History   First MD Initiated Contact with Patient 07/04/14 1241     Chief Complaint  Patient presents with  . Flank Pain  . Abdominal Pain     (Consider location/radiation/quality/duration/timing/severity/associated sxs/prior Treatment) Patient is a 59 y.o. female presenting with flank pain and abdominal pain. The history is provided by the patient.  Flank Pain Associated symptoms include abdominal pain. Pertinent negatives include no chest pain, no headaches and no shortness of breath.  Abdominal Pain Associated symptoms: no chest pain, no chills, no dysuria, no fever, no hematuria and no shortness of breath    patient sent over from urgent care for right flank pain starts in the back area comes around to the right upper quadrant of the abdomen. Started a week ago. Made worse by certain movements. Pain is currently 8 out of 10. Sharp in nature. No nausea no vomiting no fevers. Patient's had an upper respiratory type symptoms for the past 3 days only.  Past Medical History  Diagnosis Date  . Hypertension   . Diabetes mellitus without complication   . Anemia 2006    required transfusion post TAH/BSO 05/2005  . GLA deficiency   . Glaucoma of both eyes    Past Surgical History  Procedure Laterality Date  . Total abdominal hysterectomy w/ bilateral salpingoophorectomy  05/2005  . Hysteroscopy w/d&c  01/2005    for uterine fibroids.   . Esophagogastroduodenoscopy N/A 08/22/2013    Procedure: ESOPHAGOGASTRODUODENOSCOPY (EGD);  Surgeon: Irene Shipper, MD;  Location: [facility] ENDOSCOPY;  Service: Endoscopy;  Laterality: N/A;  . Colonoscopy N/A 08/22/2013    Procedure: COLONOSCOPY;  Surgeon: Irene Shipper, MD;  Location: [place];  Service: Endoscopy;  Laterality: N/A;  . Ventral hernia repair N/A 08/24/2013    Procedure: HERNIA REPAIR VENTRAL ADULT;  Surgeon: Edward Jolly, MD;  Location: [place];  Service: General;  Laterality: N/A;  .  Insertion of mesh N/A 08/24/2013    Procedure: INSERTION OF MESH;  Surgeon: Edward Jolly, MD;  Location: [place];  Service: General;  Laterality: N/A;  . Panniculectomy N/A 08/24/2013    Procedure: PANNICULECTOMY;  Surgeon: Edward Jolly, MD;  Location: [place];  Service: General;  Laterality: N/A;  . Total hip arthroplasty Right 12/12/2013    Procedure: RIGHT TOTAL HIP ARTHROPLASTY ANTERIOR APPROACH;  Surgeon: Mcarthur Rossetti, MD;  Location: [place];  Service: Orthopedics;  Laterality: Right;   History reviewed. No pertinent family history. History  Substance Use Topics  . Smoking status: Never Smoker   . Smokeless tobacco: Never Used  . Alcohol Use: No   OB History    No data available     Review of Systems  Constitutional: Negative for fever and chills.  HENT: Positive for congestion.   Eyes: Negative for visual disturbance.  Respiratory: Negative for shortness of breath.   Cardiovascular: Negative for chest pain.  Gastrointestinal: Positive for abdominal pain.  Genitourinary: Positive for flank pain. Negative for dysuria and hematuria.  Musculoskeletal: Positive for back pain.  Skin: Negative for rash.  Neurological: Negative for headaches.  Hematological: Does not bruise/bleed easily.  Psychiatric/Behavioral: Negative for confusion.      Allergies  Diclofenac and Penicillins  Home Medications   Prior to Admission medications   Medication Sig Start Date End Date Taking? Authorizing Provider  bimatoprost (LUMIGAN) 0.03 % ophthalmic solution Place 1 drop into both eyes at bedtime.   Yes Historical  Provider, MD  cetirizine (ZYRTEC) 10 MG tablet Take 1 tablet (10 mg total) by mouth 2 (two) times daily. 05/16/14  Yes Zachary H Baker, PA-C  glimepiride (AMARYL) 4 MG tablet Take 4 mg by mouth daily with breakfast.   Yes Historical Provider, MD  losartan (COZAAR) 25 MG tablet Take 25 mg by mouth daily.   Yes Historical Provider, MD  metFORMIN (GLUCOPHAGE) 500 MG  tablet Take 500 mg by mouth 2 (two) times daily with a meal.   Yes Historical Provider, MD  pseudoephedrine-guaifenesin (MUCINEX D) 60-600 MG per tablet Take 1 tablet by mouth once.   Yes Historical Provider, MD  traMADol (ULTRAM) 50 MG tablet Take 50 mg by mouth 4 (four) times daily. 07/02/14  Yes Historical Provider, MD  aspirin EC 325 MG EC tablet Take 1 tablet (325 mg total) by mouth 2 (two) times daily after a meal. Patient not taking: Reported on 07/04/2014 12/15/13   Erskine Emery, PA-C  bisacodyl (DULCOLAX) 10 MG suppository Place 1 suppository (10 mg total) rectally daily as needed for mild constipation or moderate constipation. Patient not taking: Reported on 07/04/2014 08/29/13   Megan N Dort, PA-C  ferrous sulfate 325 (65 FE) MG tablet Take 1 tablet (325 mg total) by mouth 3 (three) times daily after meals. Patient not taking: Reported on 07/04/2014 12/15/13   Erskine Emery, PA-C  fluticasone [facility]) 50 MCG/ACT nasal spray Place 2 sprays into both nostrils daily. Patient not taking: Reported on 07/04/2014 07/04/14   Harden Mo, MD  methocarbamol (ROBAXIN) 500 MG tablet Take 1 tablet (500 mg total) by mouth every 6 (six) hours as needed for muscle spasms. Patient not taking: Reported on 07/04/2014 12/15/13   Erskine Emery, PA-C  ondansetron (ZOFRAN) 4 MG tablet Take 1 tablet (4 mg total) by mouth every 8 (eight) hours as needed for nausea. Patient not taking: Reported on 07/04/2014 05/13/14   Lutricia Feil, PA  oxyCODONE-acetaminophen (ROXICET) 5-325 MG per tablet Take 1-2 tablets by mouth every 4 (four) hours as needed for severe pain. Patient not taking: Reported on 07/04/2014 12/15/13   Erskine Emery, PA-C  predniSONE (DELTASONE) 20 MG tablet Take 2 tablets (40 mg total) by mouth daily with breakfast. Starting tomorrow Patient not taking: Reported on 07/04/2014 05/16/14   Liam Graham, PA-C  ranitidine (ZANTAC) 150 MG capsule Take 1 capsule (150 mg total) by mouth 2 (two) times  daily. Patient not taking: Reported on 07/04/2014 05/16/14   Liam Graham, PA-C  traMADol (ULTRAM) 50 MG tablet Take 1 tablet (50 mg total) by mouth every 6 (six) hours as needed. 07/04/14   Fredia Sorrow, MD   BP 102/59 mmHg  Pulse 57  Temp(Src) 97.9 F (36.6 C) (Oral)  Resp 16  SpO2 99% Physical Exam  Constitutional: She is oriented to person, place, and time. She appears well-developed and well-nourished. No distress.  HENT:  Head: Normocephalic and atraumatic.  Mouth/Throat: Oropharynx is clear and moist.  Eyes: Conjunctivae and EOM are normal. Pupils are equal, round, and reactive to light.  Neck: Normal range of motion.  Cardiovascular: Normal rate, regular rhythm and normal heart sounds.   No murmur heard. Pulmonary/Chest: Effort normal.  Abdominal: Soft. Bowel sounds are normal. There is no tenderness.  Musculoskeletal: Normal range of motion. She exhibits no tenderness.  Neurological: She is oriented to person, place, and time. No cranial nerve deficit. She exhibits normal muscle tone. Coordination normal.  Skin: No rash noted.  Nursing note and vitals  reviewed.   ED Course  Procedures (including critical care time) Labs Review Labs Reviewed  COMPREHENSIVE METABOLIC PANEL - Abnormal; Notable for the following:    GFR calc non Af Amer 67 (*)    GFR calc Af Amer 78 (*)    All other components within normal limits  CBC WITH DIFFERENTIAL - Abnormal; Notable for the following:    Eosinophils Relative 9 (*)    All other components within normal limits  LIPASE, BLOOD  URINALYSIS, ROUTINE W REFLEX MICROSCOPIC   Results for orders placed or performed during the hospital encounter of 07/04/14  Comprehensive metabolic panel  Result Value Ref Range   Sodium 139 135 - 145 mmol/L   Potassium 4.4 3.5 - 5.1 mmol/L   Chloride 103 96 - 112 mEq/L   CO2 25 19 - 32 mmol/L   Glucose, Bld 95 70 - 99 mg/dL   BUN 11 6 - 23 mg/dL   Creatinine, Ser 0.93 0.50 - 1.10 mg/dL   Calcium  9.7 8.4 - 10.5 mg/dL   Total Protein 7.0 6.0 - 8.3 g/dL   Albumin 4.0 3.5 - 5.2 g/dL   AST 22 0 - 37 U/L   ALT 14 0 - 35 U/L   Alkaline Phosphatase 90 39 - 117 U/L   Total Bilirubin 1.0 0.3 - 1.2 mg/dL   GFR calc non Af Amer 67 (L) >90 mL/min   GFR calc Af Amer 78 (L) >90 mL/min   Anion gap 11 5 - 15  Lipase, blood  Result Value Ref Range   Lipase 25 11 - 59 U/L  CBC with Differential  Result Value Ref Range   WBC 5.8 4.0 - 10.5 K/uL   RBC 4.66 3.87 - 5.11 MIL/uL   Hemoglobin 13.0 12.0 - 15.0 g/dL   HCT 39.6 36.0 - 46.0 %   MCV 85.0 78.0 - 100.0 fL   MCH 27.9 26.0 - 34.0 pg   MCHC 32.8 30.0 - 36.0 g/dL   RDW 14.6 11.5 - 15.5 %   Platelets 304 150 - 400 K/uL   Neutrophils Relative % 58 43 - 77 %   Neutro Abs 3.4 1.7 - 7.7 K/uL   Lymphocytes Relative 24 12 - 46 %   Lymphs Abs 1.4 0.7 - 4.0 K/uL   Monocytes Relative 8 3 - 12 %   Monocytes Absolute 0.5 0.1 - 1.0 K/uL   Eosinophils Relative 9 (H) 0 - 5 %   Eosinophils Absolute 0.5 0.0 - 0.7 K/uL   Basophils Relative 1 0 - 1 %   Basophils Absolute 0.0 0.0 - 0.1 K/uL  Urinalysis, Routine w reflex microscopic  Result Value Ref Range   Color, Urine YELLOW YELLOW   APPearance CLEAR CLEAR   Specific Gravity, Urine 1.006 1.005 - 1.030   pH 6.0 5.0 - 8.0   Glucose, UA NEGATIVE NEGATIVE mg/dL   Hgb urine dipstick NEGATIVE NEGATIVE   Bilirubin Urine NEGATIVE NEGATIVE   Ketones, ur NEGATIVE NEGATIVE mg/dL   Protein, ur NEGATIVE NEGATIVE mg/dL   Urobilinogen, UA 0.2 0.0 - 1.0 mg/dL   Nitrite NEGATIVE NEGATIVE   Leukocytes, UA NEGATIVE NEGATIVE     Imaging Review Ct Abdomen Pelvis Wo Contrast  07/04/2014   CLINICAL DATA:  Right flank pain  EXAM: CT ABDOMEN AND PELVIS WITHOUT CONTRAST  TECHNIQUE: Multidetector CT imaging of the abdomen and pelvis was performed following the standard protocol without IV contrast.  COMPARISON:  08/19/2013  FINDINGS: Lower chest: There is no pleural effusion  identified. The lung bases appear clear.   Hepatobiliary: There is no focal liver abnormality identified. The gallbladder appears normal. No biliary dilatation.  Pancreas: Appears normal.  Spleen: Negative.  Adrenals/Urinary Tract: Normal adrenal glands. The right kidney appears normal. No right hydronephrosis or hydroureter. Normal appearance of the left kidney. No kidney stones or evidence of hydronephrosis identified. The urinary bladder is unremarkable.  Stomach/Bowel: The stomach and the small bowel loops have a normal course and caliber. No obstruction. The appendix is visualized and appears normal. Scattered colonic diverticula are noted. No acute inflammation.  Vascular/Lymphatic: Calcified atherosclerotic disease involves the abdominal aorta. There is no aneurysm. No retroperitoneal or mesenteric adenopathy. There is no pelvic or inguinal adenopathy identified.  Reproductive: Previous hysterectomy. No adnexal mass identified. The left ovary is identified within the left iliac fossa.  Other: There is no ascites or focal fluid collections within the abdomen or pelvis. Postoperative changes are identified from repair of ventral abdominal wall hernia. There is indentation along the ventral surface of the urinary bladder which is favored to reflect post surgical change.  Musculoskeletal: Degenerative disc disease is noted at T11-T12. Lower lumbar spine facet hypertrophy and degenerative change noted. Previous right total hip arthroplasty.  IMPRESSION: 1. No acute findings within the abdomen or pelvis. 2. No nephrolithiasis or evidence of hydronephrosis. 3. The appendix is visualized and appears normal.   Electronically Signed   By: Kerby Moors M.D.   On: 07/04/2014 15:16     EKG Interpretation None      MDM   Final diagnoses:  Right flank pain    Patient sent from urgent care for right sided flank pain starts in the back area moves around up under the ribs. CT ordered and is pending. Urinalysis without evidence urinary tract infection or  hematuria. Labs are pending.  CT scan negative. Symptoms may be muscular in nature. We'll treat with tramadol. Patient nontoxic no acute distress.  Fredia Sorrow, MD 07/04/14 1526

## 2014-07-04 NOTE — ED Notes (Signed)
Pt sent here from ucc, having right flank pain x 1 week, radiates around to abd. Denies urinary symptoms.

## 2014-07-04 NOTE — Discharge Instructions (Signed)
CT scan of the abdomen without any significant findings. Labs without any significant findings. Symptoms may be muscular in nature. Take the tramadol as directed and follow-up with your regular doctor. Return for any new or worse symptoms.

## 2014-07-04 NOTE — ED Provider Notes (Signed)
Chief Complaint   Flank Pain   History of Present Illness   [name] is a 59 year old diabetic female who's had a one-week history of pain in the right CVA area with radiation into the right flank. The pain is not radiated down the right leg are in the buttock. She denies any injury. The pain is severe rated 10 over 10 in intensity at the worst and now 9/10 in intensity. It's worse with any type of movement and nothing seems to make it better. She's had no associated symptoms of fever, chills, anorexia, or weight loss. Her appetite is good. No nausea or vomiting. No constipation, diarrhea, or blood in the stool. She denies any urinary or GYN complaints. She's never had anything like this before.  Review of Systems   Other than as noted above, the patient denies any of the following symptoms: Constitutional:  No fever, chills, weight loss or anorexia. Abdomen:  No nausea, vomiting, hematememesis, melena, diarrhea, or hematochezia. GU:  No dysuria, frequency, urgency, or hematuria. Gyn:  No vaginal discharge, itching, abnormal bleeding, dyspareunia, or pelvic pain.  [place]   Past medical history, family history, social history, meds, and allergies were reviewed. She is allergic to penicillin diclofenac. She has diabetes, hypertension, glaucoma. Current meds include aspirin, Dulcolax, cetirizine, para sulfate, glimepiride, losartan, metformin, and Robaxin.  Physical Exam     Vital signs:  BP 135/85 mmHg  Pulse 81  Temp(Src) 97.9 F (36.6 C) (Oral)  Resp 18  SpO2 97% Gen:  Alert, oriented, in no distress. Lungs:  Breath sounds clear and equal bilaterally.  No wheezes, rales or rhonchi. Heart:  Regular rhythm.  No gallops or murmers.   Abdomen:  The patient has moderate tenderness to palpation in the right flank, right upper quadrant, and right lower quadrant without guarding or rebound. No organomegaly or mass. Bowel sounds were not heard. Back: Moderate right CVA tenderness,  no tenderness on the left. Straight leg raising was negative. Extremities: No edema, pulses were full. Neurological exam: Normal muscle strength and DTRs. Skin:  Clear, warm and dry.  No rash.  Labs   Results for orders placed or performed during the hospital encounter of 07/04/14  POCT urinalysis dip (device)  Result Value Ref Range   Glucose, UA NEGATIVE NEGATIVE mg/dL   Bilirubin Urine NEGATIVE NEGATIVE   Ketones, ur NEGATIVE NEGATIVE mg/dL   Specific Gravity, Urine 1.010 1.005 - 1.030   Hgb urine dipstick NEGATIVE NEGATIVE   pH 6.0 5.0 - 8.0   Protein, ur NEGATIVE NEGATIVE mg/dL   Urobilinogen, UA 0.2 0.0 - 1.0 mg/dL   Nitrite NEGATIVE NEGATIVE   Leukocytes, UA NEGATIVE NEGATIVE   Assessment   The primary encounter diagnosis was Right flank pain. A diagnosis of URI (upper respiratory infection) was also pertinent to this visit.   The patient has extensive differential diagnosis including kidney stone or kidney infection, although nothing showed up in the UA. Also diverticulitis, appendicitis, gallbladder disease, aneurysm, or muscle strain. She'll need further evaluation, possibly to include a CT scan of the abdomen.  Plan     The patient was transferred to the ED via shuttle in stable condition.  Medical Decision Making:  The patient is a 59 year old diabetic female who presents with a one-week history of pain in her right CVA and flank area that's getting worse. Is rated 10 over 10 in intensity. It's worse with any movement. She denies fever, chills, nausea, vomiting, diarrhea, blood in stool, urinary, or GYN  symptoms. Examination she is tender to palpation in the right CVA and also in the right flank, right or quadrant, right lower quadrant areas. No guarding or rebound. Bowel sounds were not heard. No masses. UA was unremarkable. She has long differential diagnosis and will need further evaluation in the emergency room, to possibly include a CT scan.     Harden Mo,  MD 07/04/14 [phone]

## 2014-07-04 NOTE — ED Notes (Signed)
C/o 1 week duration of pain in right flank , pain worse at night. pain constant and feels stabbing at times. NAD at present,calm and conversant. Tender to palpation on right flank area. Also c/o cough and congestion w clear secretions

## 2014-07-04 NOTE — Discharge Instructions (Signed)
We have determined that your problem requires further evaluation in the emergency department.  We will take care of your transport there.  Once at the emergency department, you will be evaluated by a provider and they will order whatever treatment or tests they deem necessary.  We cannot guarantee that they will do any specific test or do any specific treatment.  ° °

## 2014-07-04 NOTE — ED Notes (Signed)
Patient transported to CT 

## 2014-10-30 ENCOUNTER — Ambulatory Visit
Admission: RE | Admit: 2014-10-30 | Discharge: 2014-10-30 | Disposition: A | Discharge status: Home / Self Care | Payer: BC Managed Care – PPO | Source: Ambulatory Visit | Attending: Family Medicine | Admitting: Family Medicine

## 2014-10-30 ENCOUNTER — Other Ambulatory Visit: Payer: Self-pay | Admitting: Family Medicine

## 2014-10-30 DIAGNOSIS — R05 Cough: Secondary | ICD-10-CM

## 2014-10-30 DIAGNOSIS — R059 Cough, unspecified: Secondary | ICD-10-CM

## 2014-11-12 ENCOUNTER — Emergency Department (HOSPITAL_COMMUNITY): Payer: BC Managed Care – PPO

## 2014-11-12 ENCOUNTER — Encounter (HOSPITAL_COMMUNITY): Payer: Self-pay | Admitting: Emergency Medicine

## 2014-11-12 ENCOUNTER — Emergency Department (HOSPITAL_COMMUNITY)
Admission: EM | Admit: 2014-11-12 | Discharge: 2014-11-12 | Disposition: A | Discharge status: Home / Self Care | Payer: BC Managed Care – PPO | Attending: Emergency Medicine | Admitting: Emergency Medicine

## 2014-11-12 DIAGNOSIS — D649 Anemia, unspecified: Secondary | ICD-10-CM | POA: Diagnosis not present

## 2014-11-12 DIAGNOSIS — Z880 Allergy status to penicillin: Secondary | ICD-10-CM | POA: Insufficient documentation

## 2014-11-12 DIAGNOSIS — I10 Essential (primary) hypertension: Secondary | ICD-10-CM | POA: Insufficient documentation

## 2014-11-12 DIAGNOSIS — E119 Type 2 diabetes mellitus without complications: Secondary | ICD-10-CM | POA: Diagnosis not present

## 2014-11-12 DIAGNOSIS — R109 Unspecified abdominal pain: Secondary | ICD-10-CM

## 2014-11-12 DIAGNOSIS — H409 Unspecified glaucoma: Secondary | ICD-10-CM | POA: Diagnosis not present

## 2014-11-12 DIAGNOSIS — R1030 Lower abdominal pain, unspecified: Secondary | ICD-10-CM | POA: Insufficient documentation

## 2014-11-12 DIAGNOSIS — Z79899 Other long term (current) drug therapy: Secondary | ICD-10-CM | POA: Diagnosis not present

## 2014-11-12 LAB — CBC WITH DIFFERENTIAL/PLATELET
Basophils Absolute: 0 10*3/uL (ref 0.0–0.1)
Basophils Relative: 0 % (ref 0–1)
Eosinophils Absolute: 0.3 10*3/uL (ref 0.0–0.7)
Eosinophils Relative: 3 % (ref 0–5)
HCT: 40.9 % (ref 36.0–46.0)
Hemoglobin: 13.6 g/dL (ref 12.0–15.0)
Lymphocytes Relative: 20 % (ref 12–46)
Lymphs Abs: 1.8 10*3/uL (ref 0.7–4.0)
MCH: 28.8 pg (ref 26.0–34.0)
MCHC: 33.3 g/dL (ref 30.0–36.0)
MCV: 86.5 fL (ref 78.0–100.0)
Monocytes Absolute: 0.5 10*3/uL (ref 0.1–1.0)
Monocytes Relative: 6 % (ref 3–12)
Neutro Abs: 6.6 10*3/uL (ref 1.7–7.7)
Neutrophils Relative %: 71 % (ref 43–77)
Platelets: 338 10*3/uL (ref 150–400)
RBC: 4.73 MIL/uL (ref 3.87–5.11)
RDW: 14.1 % (ref 11.5–15.5)
WBC: 9.2 10*3/uL (ref 4.0–10.5)

## 2014-11-12 LAB — COMPREHENSIVE METABOLIC PANEL
ALT: 18 U/L (ref 14–54)
AST: 18 U/L (ref 15–41)
Albumin: 3.8 g/dL (ref 3.5–5.0)
Alkaline Phosphatase: 81 U/L (ref 38–126)
Anion gap: 11 (ref 5–15)
BUN: 18 mg/dL (ref 6–20)
CO2: 27 mmol/L (ref 22–32)
Calcium: 10 mg/dL (ref 8.9–10.3)
Chloride: 97 mmol/L — ABNORMAL LOW (ref 101–111)
Creatinine, Ser: 1 mg/dL (ref 0.44–1.00)
GFR calc Af Amer: >60 mL/min (ref >60)
GFR calc non Af Amer: >60 mL/min (ref >60)
Glucose, Bld: 189 mg/dL — ABNORMAL HIGH (ref 65–99)
Potassium: 4.4 mmol/L (ref 3.5–5.1)
Sodium: 135 mmol/L (ref 135–145)
Total Bilirubin: 0.3 mg/dL (ref 0.3–1.2)
Total Protein: 6.6 g/dL (ref 6.5–8.1)

## 2014-11-12 LAB — URINALYSIS, ROUTINE W REFLEX MICROSCOPIC
Bilirubin Urine: NEGATIVE
Glucose, UA: NEGATIVE mg/dL
Hgb urine dipstick: NEGATIVE
Ketones, ur: NEGATIVE mg/dL
Leukocytes, UA: NEGATIVE
Nitrite: NEGATIVE
Protein, ur: NEGATIVE mg/dL
Specific Gravity, Urine: 1.016 (ref 1.005–1.030)
Urobilinogen, UA: 1 mg/dL (ref 0.0–1.0)
pH: 7 (ref 5.0–8.0)

## 2014-11-12 MED ORDER — OXYCODONE-ACETAMINOPHEN 5-325 MG PO TABS: 1.0000 | ORAL_TABLET | ORAL | Status: DC | PRN

## 2014-11-12 NOTE — Discharge Instructions (Signed)
Prescription for pain medication. Follow-up with your general surgeon if symptoms persist. Phone number given.

## 2014-11-12 NOTE — ED Provider Notes (Signed)
CSN: [id number]     Arrival date & time 11/12/14  1905 History   First MD Initiated Contact with Patient 11/12/14 2001     Chief Complaint  Patient presents with  . Abdominal Pain    The patient advised that she has been coughing due to bronchitis.  She says that when she coughs she feels "something moving" in her stomach.  She says it causes her ab pain to cough.     (Consider location/radiation/quality/duration/timing/severity/associated sxs/prior Treatment) HPI.... Status post ventral hernia repair approximately 1 year ago by Dr. Excell Seltzer.  Patient now feels a sense of something moving in her lower abdomen. No nausea vomiting or diarrhea. No fever. No bulging noted in her abdomen. Severity is mild to moderate. Coughing makes symptoms worse.  Past Medical History  Diagnosis Date  . Hypertension   . Diabetes mellitus without complication   . Anemia 2006    required transfusion post TAH/BSO 05/2005  . GLA deficiency   . Glaucoma of both eyes    Past Surgical History  Procedure Laterality Date  . Total abdominal hysterectomy w/ bilateral salpingoophorectomy  05/2005  . Hysteroscopy w/d&c  01/2005    for uterine fibroids.   . Esophagogastroduodenoscopy N/A 08/22/2013    Procedure: ESOPHAGOGASTRODUODENOSCOPY (EGD);  Surgeon: Irene Shipper, MD;  Location: [facility] ENDOSCOPY;  Service: Endoscopy;  Laterality: N/A;  . Colonoscopy N/A 08/22/2013    Procedure: COLONOSCOPY;  Surgeon: Irene Shipper, MD;  Location: [facility];  Service: Endoscopy;  Laterality: N/A;  . Ventral hernia repair N/A 08/24/2013    Procedure: HERNIA REPAIR VENTRAL ADULT;  Surgeon: Edward Jolly, MD;  Location: [place];  Service: General;  Laterality: N/A;  . Insertion of mesh N/A 08/24/2013    Procedure: INSERTION OF MESH;  Surgeon: Edward Jolly, MD;  Location: [place] [facility];  Service: General;  Laterality: N/A;  . Panniculectomy N/A 08/24/2013    Procedure: PANNICULECTOMY;  Surgeon: Edward Jolly, MD;  Location: [facility];   Service: General;  Laterality: N/A;  . 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 No pertinent family history. History  Substance Use Topics  . Smoking status: Never Smoker   . Smokeless tobacco: Never Used  . Alcohol Use: No   OB History    No data available     Review of Systems  All other systems reviewed and are negative.     Allergies  Diclofenac and Penicillins  Home Medications   Prior to Admission medications   Medication Sig Start Date End Date Taking? Authorizing Provider  amLODipine (NORVASC) 2.5 MG tablet Take 2.5 mg by mouth daily.   Yes Historical Provider, MD  bimatoprost (LUMIGAN) 0.03 % ophthalmic solution Place 1 drop into both eyes at bedtime.   Yes Historical Provider, MD  glimepiride (AMARYL) 4 MG tablet Take 4 mg by mouth daily with breakfast.   Yes Historical Provider, MD  losartan (COZAAR) 25 MG tablet Take 25 mg by mouth daily.   Yes Historical Provider, MD  metFORMIN (GLUCOPHAGE) 1000 MG tablet Take 1,000 mg by mouth 2 (two) times daily with a meal.   Yes Historical Provider, MD  metFORMIN (GLUCOPHAGE) 500 MG tablet Take 500 mg by mouth 2 (two) times daily with a meal.   Yes Historical Provider, MD  aspirin EC 325 MG EC tablet Take 1 tablet (  325 mg total) by mouth 2 (two) times daily after a meal. Patient not taking: Reported on 07/04/2014 12/15/13   Pete Pelt, PA-C  bisacodyl (DULCOLAX) 10 MG suppository Place 1 suppository (10 mg total) rectally daily as needed for mild constipation or moderate constipation. Patient not taking: Reported on 07/04/2014 08/29/13   Nat Christen, PA-C  cetirizine (ZYRTEC) 10 MG tablet Take 1 tablet (10 mg total) by mouth 2 (two) times daily. Patient not taking: Reported on 11/12/2014 05/16/14   Liam Graham, PA-C  ferrous sulfate 325 (65 FE)  MG tablet Take 1 tablet (325 mg total) by mouth 3 (three) times daily after meals. Patient not taking: Reported on 07/04/2014 12/15/13   Pete Pelt, PA-C  fluticasone [facility]) 50 MCG/ACT nasal spray Place 2 sprays into both nostrils daily. Patient not taking: Reported on 07/04/2014 07/04/14   Harden Mo, MD  methocarbamol (ROBAXIN) 500 MG tablet Take 1 tablet (500 mg total) by mouth every 6 (six) hours as needed for muscle spasms. Patient not taking: Reported on 07/04/2014 12/15/13   Pete Pelt, PA-C  ondansetron (ZOFRAN) 4 MG tablet Take 1 tablet (4 mg total) by mouth every 8 (eight) hours as needed for nausea. Patient not taking: Reported on 07/04/2014 05/13/14   Lutricia Feil, PA  oxyCODONE-acetaminophen (PERCOCET) 5-325 MG per tablet Take 1-2 tablets by mouth every 4 (four) hours as needed. 11/12/14   Nat Christen, MD  predniSONE (DELTASONE) 20 MG tablet Take 2 tablets (40 mg total) by mouth daily with breakfast. Starting tomorrow Patient not taking: Reported on 07/04/2014 05/16/14   Liam Graham, PA-C  ranitidine (ZANTAC) 150 MG capsule Take 1 capsule (150 mg total) by mouth 2 (two) times daily. Patient not taking: Reported on 07/04/2014 05/16/14   Liam Graham, PA-C  traMADol (ULTRAM) 50 MG tablet Take 1 tablet (50 mg total) by mouth every 6 (six) hours as needed. Patient not taking: Reported on 11/12/2014 07/04/14   Fredia Sorrow, MD   BP 119/70 mmHg  Pulse 58  Temp(Src) 98 F (36.7 C) (Oral)  Resp 18  Ht 5\' 2"  (1.575 m)  Wt 223 lb (101.152 kg)  BMI 40.78 kg/m2  SpO2 100% Physical Exam  Constitutional: She is oriented to person, place, and time. She appears well-developed and well-nourished.  HENT:  Head: Normocephalic and atraumatic.  Eyes: Conjunctivae and EOM are normal. Pupils are equal, round, and reactive to light.  Neck: Normal range of motion. Neck supple.  Cardiovascular: Normal rate and regular rhythm.   Pulmonary/Chest: Effort normal and breath  sounds normal.  Abdominal: Soft. Bowel sounds are normal.  Suture line horizontally and lower abdomen.  No bulging or herniation noted.    Musculoskeletal: Normal range of motion.  Neurological: She is alert and oriented to person, place, and time.  Skin: Skin is warm and dry.  Psychiatric: She has a normal mood and affect. Her behavior is normal.  Nursing note and vitals reviewed.   ED Course  Procedures (including critical care time) Labs Review Labs Reviewed  COMPREHENSIVE METABOLIC PANEL - Abnormal; Notable for the following:    Chloride 97 (*)    Glucose, Bld 189 (*)    All other components within normal limits  CBC WITH DIFFERENTIAL/PLATELET  URINALYSIS, ROUTINE W REFLEX MICROSCOPIC    Imaging Review Dg Abd Acute W/chest  11/12/2014   CLINICAL DATA:  Left side abdominal pain today.Pt said she feels like something inside was moving.Pt had a hernia  repair last March.  EXAM: DG ABDOMEN ACUTE W/ 1V CHEST  COMPARISON:  10/30/2014  FINDINGS: There is no evidence of dilated bowel loops or free intraperitoneal air. No radiopaque calculi or other significant radiographic abnormality is seen. Heart size and mediastinal contours are within normal limits. Both lungs are clear.  Well-aligned right hip prosthesis.  IMPRESSION: Negative abdominal radiographs.  No acute cardiopulmonary disease.   Electronically Signed   By: Lajean Manes M.D.   On: 11/12/2014 21:33     EKG Interpretation None      MDM   Final diagnoses:  Abdominal pain   No acute abdomen. Acute abdominal series shows negative free air or obstruction. No hernia palpated. She will follow-up with general surgery.    Nat Christen, MD 11/14/14 1135

## 2014-11-12 NOTE — ED Notes (Signed)
The patient advised that she has been coughing due to bronchitis.  She says that when she coughs she feels "something moving" in her stomach.  She says it causes her ab pain to cough.  She says she had a hernia repair in 2015 and she feels like it maybe the repair has ruptured.  She rates her pain 5/10.  She denies any problems urinating or defecating.

## 2014-12-17 ENCOUNTER — Other Ambulatory Visit: Payer: Self-pay

## 2015-01-03 ENCOUNTER — Other Ambulatory Visit (INDEPENDENT_AMBULATORY_CARE_PROVIDER_SITE_OTHER): Payer: BC Managed Care – PPO

## 2015-01-03 ENCOUNTER — Ambulatory Visit (INDEPENDENT_AMBULATORY_CARE_PROVIDER_SITE_OTHER): Payer: BC Managed Care – PPO | Admitting: Internal Medicine

## 2015-01-03 ENCOUNTER — Encounter: Payer: Self-pay | Admitting: Internal Medicine

## 2015-01-03 VITALS — BP 116/78 | HR 85 | Ht 62.0 in | Wt 223.2 lb

## 2015-01-03 DIAGNOSIS — J45991 Cough variant asthma: Secondary | ICD-10-CM | POA: Diagnosis not present

## 2015-01-03 LAB — CBC WITH DIFFERENTIAL/PLATELET
Basophils Absolute: 0 10*3/uL (ref 0.0–0.1)
Basophils Relative: 0.7 % (ref 0.0–3.0)
Eosinophils Absolute: 0.8 10*3/uL — ABNORMAL HIGH (ref 0.0–0.7)
Eosinophils Relative: 13.5 % — ABNORMAL HIGH (ref 0.0–5.0)
HCT: 40 % (ref 36.0–46.0)
Hemoglobin: 13.3 g/dL (ref 12.0–15.0)
Lymphocytes Relative: 24.1 % (ref 12.0–46.0)
Lymphs Abs: 1.4 10*3/uL (ref 0.7–4.0)
MCHC: 33.2 g/dL (ref 30.0–36.0)
MCV: 87.4 fl (ref 78.0–100.0)
Monocytes Absolute: 0.4 10*3/uL (ref 0.1–1.0)
Monocytes Relative: 6.4 % (ref 3.0–12.0)
Neutro Abs: 3.1 10*3/uL (ref 1.4–7.7)
Neutrophils Relative %: 55.3 % (ref 43.0–77.0)
Platelets: 310 10*3/uL (ref 150.0–400.0)
RBC: 4.58 Mil/uL (ref 3.87–5.11)
RDW: 15 % (ref 11.5–15.5)
WBC: 5.6 10*3/uL (ref 4.0–10.5)

## 2015-01-03 MED ORDER — BENZONATATE 200 MG PO CAPS: ORAL_CAPSULE | ORAL | Status: DC

## 2015-01-03 MED ORDER — BUDESONIDE-FORMOTEROL FUMARATE 80-4.5 MCG/ACT IN AERO: INHALATION_SPRAY | RESPIRATORY_TRACT | Status: DC

## 2015-01-03 NOTE — Progress Notes (Signed)
Subjective:    Patient ID: [name], female    DOB: [birth year],    MRN: [id number]  HPI  59 yobf minimal smoker quit age 59 and never any resp problem until Jan 2016 acute onset uri and coughed ever since so referred 01/03/2015 by Dr Antony Contras   01/03/2015 1st [facility] office visit/ [name]   Chief Complaint  Patient presents with  . Pulmonary Consult    Pt c/o cough off and on since Jan 2016. Cough is prod with thick, clear sputum.  Tessalon helps some.    x 3 years prior to OV  Has  needed zyrtec spring summer , never albuterol and never needed allergy or sinus eval Then  abrupt  Onset cough Jan 2016 never stopped completely / some better with rx for gerd/ pred/ tessilon  Coughing daily worse when lie down and disturbs sleep and sometimes 4 am when cough meds wear  off, better first thing in am, then  worse as days goes on   No obvious other patterns in day to day or daytime variabilty or assoc sob or cp or chest tightness, subjective wheeze overt sinus or hb symptoms. No unusual exp hx or h/o childhood pna/ asthma or knowledge of premature birth.  Sleeping ok without nocturnal  or early am exacerbation  of respiratory  c/o's or need for noct saba. Also denies any obvious fluctuation of symptoms with weather or environmental changes or other aggravating or alleviating factors except as outlined above   Current Medications, Allergies, Complete Past Medical History, Past Surgical History, Family History, and Social History were reviewed in [employer] record.           Review of Systems  Constitutional: Negative for fever, chills and unexpected weight change.  HENT: Positive for dental problem. Negative for congestion, ear pain, nosebleeds, postnasal drip, rhinorrhea, sinus pressure, sneezing, sore throat, trouble swallowing and voice change.   Eyes: Negative for visual disturbance.  Respiratory: Positive for cough. Negative for choking and  shortness of breath.   Cardiovascular: Negative for chest pain and leg swelling.  Gastrointestinal: Negative for vomiting, abdominal pain and diarrhea.  Genitourinary: Negative for difficulty urinating.  Musculoskeletal: Negative for arthralgias.  Skin: Negative for rash.  Neurological: Negative for tremors, syncope and headaches.  Hematological: Does not bruise/bleed easily.       Objective:   Physical Exam  amb obese bf with nasal tone to voice   Wt Readings from Last 3 Encounters:  01/03/15 223 lb 3.2 oz (101.243 kg)  11/12/14 223 lb (101.152 kg)  12/12/13 205 lb (92.987 kg)    Vital signs reviewed    HEENT: nl dentition, turbinates, and orophanx. Nl external ear canals without cough reflex   NECK :  without JVD/Nodes/TM/ nl carotid upstrokes bilaterally   LUNGS: no acc muscle use, clear to A and P bilaterally without cough on insp or exp maneuvers   CV:  RRR  no s3 or murmur or increase in P2, no edema   ABD:  soft and nontender with nl excursion in the supine position. No bruits or organomegaly, bowel sounds nl  MS:  warm without deformities, calf tenderness, cyanosis or clubbing  SKIN: warm and dry without lesions    NEURO:  alert, approp, no deficits     I personally reviewed images and agree with radiology impression as follows:  CXR:  10/30/14 1. Mild right base atelectasis. 2. Borderline cardiomegaly. No CHF. My review: no sign atx  or chf                Assessment & Plan:

## 2015-01-03 NOTE — Patient Instructions (Addendum)
Symbicort 80 Take 2 puffs first thing in am and then another 2 puffs about 12 hours later.   Stop zyrtec and For drainage/allergies try  take chlortrimeton (chlorpheniramine) 4 mg every 4 hours available over the counter (may cause drowsiness so start by taking one or two at bedtime to see how you tolerate it   Omeprazole 20 mg Take 30- 60 min before your first and last meals of the day   GERD (REFLUX)  is an extremely common cause of respiratory symptoms just like yours , many times with no obvious heartburn at all.    It can be treated with medication, but also with lifestyle changes including elevation of the head of your bed (ideally with 6 inch  bed blocks),  Smoking cessation, avoidance of late meals, excessive alcohol, and avoid fatty foods, chocolate, peppermint, colas, red wine, and acidic juices such as orange juice.  NO MINT OR MENTHOL PRODUCTS SO NO COUGH DROPS  USE SUGARLESS CANDY INSTEAD (Jolley ranchers or Stover's or Life Savers) or even ice chips will also do - the key is to swallow to prevent all throat clearing. NO OIL BASED VITAMINS - use powdered substitutes.  For cough take tessilon 200 mg up to every 4 hours but should be able to taper this off over several weeks   Please see patient coordinator before you leave today  to schedule sinus CT   Please remember to go to the lab   department downstairs for your tests - we will call you with the results when they are available.    Change f/u to 2 weeks

## 2015-01-04 ENCOUNTER — Encounter: Payer: Self-pay | Admitting: Internal Medicine

## 2015-01-04 LAB — ALLERGY FULL PROFILE
Allergen, D pternoyssinus,d7: 0.45 kU/L — ABNORMAL HIGH
Allergen,Goose feathers, e70: 0.1 kU/L — ABNORMAL HIGH
Alternaria Alternata: <0.1 kU/L
Aspergillus fumigatus, m3: <0.1 kU/L
Bahia Grass: 0.11 kU/L — ABNORMAL HIGH
Bermuda Grass: <0.1 kU/L
Box Elder IgE: 0.11 kU/L — ABNORMAL HIGH
Candida Albicans: 1.57 kU/L — ABNORMAL HIGH
Cat Dander: 0.54 kU/L — ABNORMAL HIGH
Common Ragweed: <0.1 kU/L
Curvularia lunata: <0.1 kU/L
D. farinae: 0.39 kU/L — ABNORMAL HIGH
Dog Dander: 0.15 kU/L — ABNORMAL HIGH
Elm IgE: <0.1 kU/L
Fescue: <0.1 kU/L
G005 Rye, Perennial: <0.1 kU/L
G009 Red Top: <0.1 kU/L
Goldenrod: <0.1 kU/L
Helminthosporium halodes: <0.1 kU/L
House Dust Hollister: 0.19 kU/L — ABNORMAL HIGH
IgE (Immunoglobulin E), Serum: 1256 kU/L — ABNORMAL HIGH (ref <115)
Lamb's Quarters: 0.12 kU/L — ABNORMAL HIGH
Oak: <0.1 kU/L
Plantain: <0.1 kU/L
Stemphylium Botryosum: <0.1 kU/L
Sycamore Tree: <0.1 kU/L
Timothy Grass: 0.11 kU/L — ABNORMAL HIGH

## 2015-01-04 NOTE — Assessment & Plan Note (Addendum)
The most common causes of chronic cough in immunocompetent adults include the following: upper airway cough syndrome (UACS), previously referred to as postnasal drip syndrome (PNDS), which is caused by variety of rhinosinus conditions; (2) asthma; (3) GERD; (4) chronic bronchitis from cigarette smoking or other inhaled environmental irritants; (5) nonasthmatic eosinophilic bronchitis; and (6) bronchiectasis.   These conditions, singly or in combination, have accounted for up to 94% of the causes of chronic cough in prospective studies.   Other conditions have constituted no >6% of the causes in prospective studies These have included bronchogenic carcinoma, chronic interstitial pneumonia, sarcoidosis, left ventricular failure, ACEI-induced cough, and aspiration from a condition associated with pharyngeal dysfunction.    Chronic cough is often simultaneously caused by more than one condition. A single cause has been found from 38 to 82% of the time, multiple causes from 18 to 62%. Multiply caused cough has been the result of three diseases up to 42% of the time.       Based on hx and exam, this is most likely:  Cough variant asthma vs   Upper airway cough syndrome, so named because it's frequently impossible to sort out how much is  CR/sinusitis with freq throat clearing (which can be related to primary GERD)   vs  causing  secondary (" extra esophageal")  GERD from wide swings in gastric pressure that occur with throat clearing, often  promoting self use of mint and menthol lozenges that reduce the lower esophageal sphincter tone and exacerbate the problem further in a cyclical fashion.   These are the same pts (now being labeled as having "irritable larynx syndrome" by some cough centers) who not infrequently have a history of having failed to tolerate ace inhibitors,  dry powder inhalers or biphosphonates or report having atypical reflux symptoms that don't respond to standard doses of PPI , and are  easily confused as having aecopd or asthma flares by even experienced allergists/ pulmonologists.   The first step is to maximize GERD Rx, and eliminate cyclical coughing with higher doses of tessilon and start symbicort 80 2bid then  regroup in 2 weeks  The proper method of use, as well as anticipated side effects, of a metered-dose inhaler are discussed and demonstrated to the patient. Improved effectiveness after extensive coaching during this visit to a level of approximately  75%   I had an extended discussion with the patient reviewing all relevant studies completed to date x 35 m  Each maintenance medication was reviewed in detail including most importantly the difference between maintenance and prns and under what circumstances the prns are to be triggered using an action plan format that is not reflected in the computer generated alphabetically organized AVS.    Please see instructions for details which were reviewed in writing and the patient given a copy highlighting the part that I personally wrote and discussed at today's ov.

## 2015-01-07 ENCOUNTER — Telehealth: Payer: Self-pay | Admitting: Internal Medicine

## 2015-01-07 NOTE — Telephone Encounter (Signed)
If cannot reach patient at home, try [phone]

## 2015-01-07 NOTE — Telephone Encounter (Signed)
Notes Recorded by Rosana Berger, CMA on 01/07/2015 at 1:49 PM LMTCB Notes Recorded by Tanda Rockers, MD on 01/07/2015 at 1:00 PM Call patient : Studies are c/w severe allergy to cat > dog dust and mold /min grass --------------------- Spoke with pt. She is aware of results. Nothing further was needed.

## 2015-01-07 NOTE — Progress Notes (Signed)
Quick Note:  LMTCB ______ 

## 2015-01-08 ENCOUNTER — Telehealth: Payer: Self-pay | Admitting: Internal Medicine

## 2015-01-08 ENCOUNTER — Ambulatory Visit (INDEPENDENT_AMBULATORY_CARE_PROVIDER_SITE_OTHER)
Admission: RE | Admit: 2015-01-08 | Discharge: 2015-01-08 | Disposition: A | Discharge status: Home / Self Care | Payer: BC Managed Care – PPO | Source: Ambulatory Visit | Attending: Internal Medicine | Admitting: Internal Medicine

## 2015-01-08 DIAGNOSIS — J329 Chronic sinusitis, unspecified: Secondary | ICD-10-CM

## 2015-01-08 DIAGNOSIS — J45991 Cough variant asthma: Secondary | ICD-10-CM

## 2015-01-08 NOTE — Telephone Encounter (Signed)
Result Note     Call patient : Studies are c/w severe allergy to cat > dog dust and mold /min grass   lmomtcb x1

## 2015-01-09 NOTE — Telephone Encounter (Signed)
Spoke with pt and notified of results per Dr. Wert. Pt verbalized understanding and denied any questions. 

## 2015-01-17 ENCOUNTER — Encounter: Payer: Self-pay | Admitting: Internal Medicine

## 2015-01-17 ENCOUNTER — Ambulatory Visit (INDEPENDENT_AMBULATORY_CARE_PROVIDER_SITE_OTHER): Payer: BC Managed Care – PPO | Admitting: Internal Medicine

## 2015-01-17 VITALS — BP 120/82 | HR 68 | Ht 62.0 in | Wt 223.0 lb

## 2015-01-17 DIAGNOSIS — J45991 Cough variant asthma: Secondary | ICD-10-CM | POA: Diagnosis not present

## 2015-01-17 DIAGNOSIS — J329 Chronic sinusitis, unspecified: Secondary | ICD-10-CM

## 2015-01-17 MED ORDER — PREDNISONE 10 MG PO TABS: ORAL_TABLET | ORAL | Status: DC

## 2015-01-17 NOTE — Progress Notes (Signed)
Subjective:    Patient ID: [name], female    DOB: 03/11/1956     MRN: [id number]    Brief patient profile:  59 yobf minimal smoker quit age 59 and never any resp problem until Jan 2016 acute onset uri and coughed ever since so referred 01/03/2015 by Dr Antony Contras    History of Present Illness  01/03/2015 1st [facility] office visit/ [name]   Chief Complaint  Patient presents with  . Pulmonary Consult    Pt c/o cough off and on since Jan 2016. Cough is prod with thick, clear sputum.  Tessalon helps some.    x 3 years prior to OV  Has  needed zyrtec spring summer , never albuterol and never needed allergy or sinus eval Then  abrupt  Onset cough Jan 2016 never stopped completely / some better with rx for gerd/ pred/ tessilon  Coughing daily worse when lie down and disturbs sleep and sometimes 4 am when cough meds wear off, better first thing in am, then  worse as days goes on  rec Symbicort 80 Take 2 puffs first thing in am and then another 2 puffs about 12 hours later.  Stop zyrtec and For drainage/allergies try  take chlortrimeton (chlorpheniramine) 4 mg every 4 hours available over the counter (may cause drowsiness so start by taking one or two at bedtime to see how you tolerate it  Omeprazole 20 mg Take 30- 60 min before your first and last meals of the day  GERD diet  For cough take tessilon 200 mg up to every 4 hours but should be able to taper this off over several weeks   schedule sinus CT > Multifocal paranasal sinus disease, most pronounced in the left ethmoid air cell complex and left sphenoid sinus region. Bilateral ostiomeatal unit obstruction    01/22/2015 f/u ov/[name] re: cough since Jan 2016  Chief Complaint  Patient presents with  . Follow-up    Cough has improved some. Cough is still prod at times with minimal clear to yellow sputum.     coughing some at hs but does not disturb sleep  No obvious patterns in  day to day or daytime variability or assoc  sob or cp or chest tightness, subjective wheeze or overt sinus or hb symptoms. No unusual exp hx or h/o childhood pna/ asthma or knowledge of premature birth.  Sleeping ok without nocturnal  or early am exacerbation  of respiratory  c/o's or need for noct saba. Also denies any obvious fluctuation of symptoms with weather or environmental changes or other aggravating or alleviating factors except as outlined above   Current Medications, Allergies, Complete Past Medical History, Past Surgical History, Family History, and Social History were reviewed in [employer] record.  ROS  The following are not active complaints unless bolded sore throat, dysphagia, dental problems, itching, sneezing,  nasal congestion or excess/ purulent secretions, ear ache,   fever, chills, sweats, unintended wt loss, classically pleuritic or exertional cp, hemoptysis,  orthopnea pnd or leg swelling, presyncope, palpitations, abdominal pain, anorexia, nausea, vomiting, diarrhea  or change in bowel or bladder habits, change in stools or urine, dysuria,hematuria,  rash, arthralgias, visual complaints, headache, numbness, weakness or ataxia or problems with walking or coordination,  change in mood/affect or memory.                       Objective:   Physical Exam  amb obese bf with  nasal tone to voice    01/17/2015        223  Wt Readings from Last 3 Encounters:  01/03/15 223 lb 3.2 oz (101.243 kg)  11/12/14 223 lb (101.152 kg)  12/12/13 205 lb (92.987 kg)    Vital signs reviewed    HEENT: nl dentition, turbinates, and orophanx. Nl external ear canals without cough reflex   NECK :  without JVD/Nodes/TM/ nl carotid upstrokes bilaterally   LUNGS: no acc muscle use, clear to A and P bilaterally without cough on insp or exp maneuvers   CV:  RRR  no s3 or murmur or increase in P2, no edema   ABD:  soft and nontender with nl excursion in the supine position. No bruits or organomegaly, bowel  sounds nl  MS:  warm without deformities, calf tenderness, cyanosis or clubbing  SKIN: warm and dry without lesions    NEURO:  alert, approp, no deficits     I personally reviewed images and agree with radiology impression as follows:  CXR:  10/30/14 1. Mild right base atelectasis. 2. Borderline cardiomegaly. No CHF. My review: no sign atx or chf          Assessment & Plan:

## 2015-01-17 NOTE — Patient Instructions (Addendum)
Please see patient coordinator before you leave today  to schedule ENT eval of sinus dz  No change in medications    Work on maintaining inhaler technique:  relax and gently blow all the way out then take a nice smooth deep breath back in, triggering the inhaler at same time you start breathing in.  Hold for up to 5 seconds if you can. Blow out thru nose   Rinse and gargle with water when done     We will set you up with allergy appt with Dr Annamaria Boots but you must stop antihistamines (zyrtec or chlortrimeton/chorpheniramine) one week before tests  See me if needed between now and when you see Dr Annamaria Boots

## 2015-01-22 ENCOUNTER — Encounter: Payer: Self-pay | Admitting: Internal Medicine

## 2015-01-22 NOTE — Assessment & Plan Note (Signed)
Sinus CT 01/08/2015  Multifocal paranasal sinus disease, most pronounced in the left ethmoid air cell complex and left sphenoid sinus region. Bilateral ostiomeatal unit obstruction. 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 No air-fluid levels.> referred to ENT 01/17/2015 >>>

## 2015-01-22 NOTE — Assessment & Plan Note (Addendum)
-  allergy profile 01/03/2015 > 0.8, IgE 1256 cat > dog  Dust/ mold - sinus ct > Pos > see sinusitis  The proper method of use, as well as anticipated side effects, of a metered-dose inhaler are discussed and demonstrated to the patient. Improved effectiveness after extensive coaching during this visit to a level of approximately  75% so rec she continue symbicort 80 2bid   All goals of chronic asthma control met including optimal function and elimination of symptoms with minimal need for rescue therapy.  Contingencies discussed in full including contacting this office immediately if not controlling the symptoms using the rule of two's.     I had an extended discussion with the patient reviewing all relevant studies completed to date and  lasting 15 to 20 minutes of a 25 minute visit    Each maintenance medication was reviewed in detail including most importantly the difference between maintenance and prns and under what circumstances the prns are to be triggered using an action plan format that is not reflected in the computer generated alphabetically organized AVS.    Please see instructions for details which were reviewed in writing and the patient given a copy highlighting the part that I personally wrote and discussed at today's ov.

## 2015-01-23 NOTE — Assessment & Plan Note (Addendum)
Body mass index is 40.78 kg/(m^2).  No results found for: TSH   Contributing to tendency to GERE/  doe/reviewed need  achieve and maintain neg calorie balance > defer f/u primary care including intermittently monitoring thyroid status

## 2015-02-20 ENCOUNTER — Encounter (HOSPITAL_COMMUNITY): Payer: Self-pay | Admitting: *Deleted

## 2015-02-20 ENCOUNTER — Emergency Department (HOSPITAL_COMMUNITY)
Admission: EM | Admit: 2015-02-20 | Discharge: 2015-02-20 | Disposition: A | Discharge status: Home / Self Care | Payer: BC Managed Care – PPO | Source: Home / Self Care | Attending: Family Medicine | Admitting: Family Medicine

## 2015-02-20 DIAGNOSIS — J45901 Unspecified asthma with (acute) exacerbation: Secondary | ICD-10-CM | POA: Diagnosis not present

## 2015-02-20 MED ORDER — ALBUTEROL SULFATE HFA 108 (90 BASE) MCG/ACT IN AERS: 2.0000 | INHALATION_SPRAY | Freq: Four times a day (QID) | RESPIRATORY_TRACT | Status: DC | PRN

## 2015-02-20 MED ORDER — ALBUTEROL SULFATE (2.5 MG/3ML) 0.083% IN NEBU
5.0000 mg | INHALATION_SOLUTION | Freq: Once | RESPIRATORY_TRACT | Status: AC
  Administered 2015-02-20: 5 mg via RESPIRATORY_TRACT

## 2015-02-20 MED ORDER — METHYLPREDNISOLONE SODIUM SUCC 125 MG IJ SOLR
125.0000 mg | Freq: Once | INTRAMUSCULAR | Status: AC
  Administered 2015-02-20: 125 mg via INTRAMUSCULAR

## 2015-02-20 MED ORDER — IPRATROPIUM BROMIDE 0.02 % IN SOLN
RESPIRATORY_TRACT | Status: AC
  Filled 2015-02-20: qty 2.5

## 2015-02-20 MED ORDER — IPRATROPIUM BROMIDE 0.02 % IN SOLN
0.5000 mg | Freq: Once | RESPIRATORY_TRACT | Status: AC
  Administered 2015-02-20: 0.5 mg via RESPIRATORY_TRACT

## 2015-02-20 MED ORDER — METHYLPREDNISOLONE SODIUM SUCC 125 MG IJ SOLR
INTRAMUSCULAR | Status: AC
  Filled 2015-02-20: qty 2

## 2015-02-20 MED ORDER — ALBUTEROL SULFATE (2.5 MG/3ML) 0.083% IN NEBU
INHALATION_SOLUTION | RESPIRATORY_TRACT | Status: AC
  Filled 2015-02-20: qty 6

## 2015-02-20 MED ORDER — METHYLPREDNISOLONE 4 MG PO TBPK: ORAL_TABLET | ORAL | Status: DC

## 2015-02-20 NOTE — ED Notes (Addendum)
Pt       Reports       Cough   /  Congested             Wheezing           X  3  Days     Pt   Taking          meds   But  Not   Clearing  The  Symptoms    Pt     Cap  Refill  Is  Intact       Skin is  Warm  And  Dry

## 2015-02-20 NOTE — ED Provider Notes (Signed)
CSN: [id number]     Arrival date & time 02/20/15  1833 History   First MD Initiated Contact with Patient 02/20/15 1948     Chief Complaint  Patient presents with  . Wheezing   (Consider location/radiation/quality/duration/timing/severity/associated sxs/prior Treatment) Patient is a 59 y.o. female presenting with wheezing. The history is provided by the patient.  Wheezing Severity:  Mild Severity compared to prior episodes:  Similar Onset quality:  Gradual Duration:  4 days Progression:  Unchanged Chronicity:  Recurrent Ineffective treatments:  Beta-agonist inhaler Associated symptoms: cough, rhinorrhea and shortness of breath     Past Medical History  Diagnosis Date  . Hypertension   . Diabetes mellitus without complication   . Anemia 2006    required transfusion post TAH/BSO 05/2005  . GLA deficiency   . Glaucoma of both eyes    Past Surgical History  Procedure Laterality Date  . Total abdominal hysterectomy w/ bilateral salpingoophorectomy  05/2005  . Hysteroscopy w/d&c  01/2005    for uterine fibroids.   . Esophagogastroduodenoscopy N/A 08/22/2013    Procedure: ESOPHAGOGASTRODUODENOSCOPY (EGD);  Surgeon: Irene Shipper, MD;  Location: [facility] ENDOSCOPY;  Service: Endoscopy;  Laterality: N/A;  . Colonoscopy N/A 08/22/2013    Procedure: COLONOSCOPY;  Surgeon: Irene Shipper, MD;  Location: [place];  Service: Endoscopy;  Laterality: N/A;  . Ventral hernia repair N/A 08/24/2013    Procedure: HERNIA REPAIR VENTRAL ADULT;  Surgeon: Edward Jolly, MD;  Location: [place];  Service: General;  Laterality: N/A;  . Insertion of mesh N/A 08/24/2013    Procedure: INSERTION OF MESH;  Surgeon: Edward Jolly, MD;  Location: [place];  Service: General;  Laterality: N/A;  . Panniculectomy N/A 08/24/2013    Procedure: PANNICULECTOMY;  Surgeon: Edward Jolly, MD;  Location: [place];  Service: General;  Laterality: N/A;  . Total hip arthroplasty Right 12/12/2013    Procedure: RIGHT TOTAL HIP  ARTHROPLASTY ANTERIOR APPROACH;  Surgeon: Mcarthur Rossetti, MD;  Location: [place];  Service: Orthopedics;  Laterality: Right;   Family History  Problem Relation Age of Onset  . Emphysema Mother     smoked  . Colon cancer Father    Social History  Substance Use Topics  . Smoking status: Former Smoker -- 0.25 packs/day for 15 years    Types: Cigarettes    Quit date: 06/22/1978  . Smokeless tobacco: Never Used  . Alcohol Use: No   OB History    No data available     Review of Systems  Constitutional: Negative.   HENT: Positive for congestion and rhinorrhea.   Respiratory: Positive for cough, shortness of breath and wheezing.   Cardiovascular: Negative.     Allergies  Diclofenac and Penicillins  Home Medications   Prior to Admission medications   Medication Sig Start Date End Date Taking? Authorizing Provider  albuterol (PROVENTIL HFA;VENTOLIN HFA) 108 (90 BASE) MCG/ACT inhaler Inhale 2 puffs into the lungs every 6 (six) hours as needed for wheezing or shortness of breath. 02/20/15   Billy Fischer, MD  amLODipine (NORVASC) 2.5 MG tablet Take 2.5 mg by mouth daily.    Historical Provider, MD  aspirin 81 MG tablet Take 81 mg by mouth daily.    Historical Provider, MD  benzonatate (TESSALON) 200 MG capsule One four times daily as needed for cough 01/03/15   Tanda Rockers, MD  bimatoprost (LUMIGAN) 0.03 % ophthalmic solution Place 1 drop into both eyes at bedtime.    Historical Provider, MD  budesonide-formoterol (SYMBICORT) 80-4.5 MCG/ACT inhaler Take 2 puffs first thing in am and then another 2 puffs about 12 hours later. 01/03/15   Tanda Rockers, MD  glimepiride (AMARYL) 4 MG tablet Take 4 mg by mouth daily with breakfast.    Historical Provider, MD  losartan (COZAAR) 25 MG tablet Take 25 mg by mouth daily.    Historical Provider, MD  MELATONIN PO Take 1 tablet by mouth at bedtime as needed.    Historical Provider, MD  metFORMIN (GLUCOPHAGE) 1000 MG tablet Take 1,000 mg by  mouth 2 (two) times daily with a meal.    Historical Provider, MD  methylPREDNISolone (MEDROL DOSEPAK) 4 MG TBPK tablet follow package directions, start on thurs, take until finished 02/20/15   Billy Fischer, MD  omeprazole (PRILOSEC OTC) 20 MG tablet Take 20 mg by mouth 2 (two) times daily before a meal.    Historical Provider, MD  predniSONE (DELTASONE) 10 MG tablet Take  4 each am x 2 days,   2 each am x 2 days,  1 each am x 2 days and stop 01/17/15   Tanda Rockers, MD  promethazine-codeine [facility] WITH CODEINE) 6.25-10 MG/5ML syrup 1 tsp every 6 hours as needed 12/21/14   Historical Provider, MD   Meds Ordered and Administered this Visit   Medications  albuterol (PROVENTIL) (2.5 MG/3ML) 0.083% nebulizer solution 5 mg (5 mg Nebulization Given 02/20/15 2007)  ipratropium (ATROVENT) nebulizer solution 0.5 mg (0.5 mg Nebulization Given 02/20/15 2007)  methylPREDNISolone sodium succinate (SOLU-MEDROL) 125 mg/2 mL injection 125 mg (125 mg Intramuscular Given 02/20/15 2007)    BP 143/85 mmHg  Pulse 90  Temp(Src) 98 F (36.7 C) (Oral)  Resp 16  SpO2 97% No data found.   Physical Exam  Constitutional: She is oriented to person, place, and time. She appears well-developed and well-nourished. No distress.  HENT:  Right Ear: External ear normal.  Left Ear: External ear normal.  Mouth/Throat: Oropharynx is clear and moist.  Neck: Normal range of motion. Neck supple.  Cardiovascular: Normal rate, regular rhythm and normal heart sounds.   Pulmonary/Chest: She has wheezes.  Neurological: She is alert and oriented to person, place, and time.  Skin: Skin is warm and dry.  Nursing note and vitals reviewed.   ED Course  Procedures (including critical care time)  Labs Review Labs Reviewed - No data to display  Imaging Review No results found.   Visual Acuity Review  Right Eye Distance:   Left Eye Distance:   Bilateral Distance:    Right Eye Near:   Left Eye Near:    Bilateral  Near:         MDM   1. Asthma exacerbation, mild    Sx improved, lungs min wheeze at time of d/c.    Billy Fischer, MD 02/20/15 2036

## 2015-05-06 ENCOUNTER — Institutional Professional Consult (permissible substitution): Payer: BC Managed Care – PPO | Admitting: Internal Medicine

## 2015-06-20 ENCOUNTER — Ambulatory Visit: Payer: Self-pay | Admitting: Surgery

## 2015-06-20 ENCOUNTER — Encounter (HOSPITAL_BASED_OUTPATIENT_CLINIC_OR_DEPARTMENT_OTHER)
Admission: RE | Admit: 2015-06-20 | Discharge: 2015-06-20 | Disposition: A | Discharge status: Home / Self Care | Payer: BC Managed Care – PPO | Source: Ambulatory Visit | Attending: Surgery | Admitting: Surgery

## 2015-06-20 ENCOUNTER — Encounter (HOSPITAL_BASED_OUTPATIENT_CLINIC_OR_DEPARTMENT_OTHER): Payer: Self-pay | Admitting: *Deleted

## 2015-06-20 DIAGNOSIS — Z87891 Personal history of nicotine dependence: Secondary | ICD-10-CM | POA: Diagnosis not present

## 2015-06-20 DIAGNOSIS — Z6838 Body mass index (BMI) 38.0-38.9, adult: Secondary | ICD-10-CM | POA: Diagnosis not present

## 2015-06-20 DIAGNOSIS — E119 Type 2 diabetes mellitus without complications: Secondary | ICD-10-CM | POA: Diagnosis not present

## 2015-06-20 DIAGNOSIS — Z794 Long term (current) use of insulin: Secondary | ICD-10-CM | POA: Diagnosis not present

## 2015-06-20 DIAGNOSIS — I10 Essential (primary) hypertension: Secondary | ICD-10-CM | POA: Diagnosis not present

## 2015-06-20 DIAGNOSIS — D170 Benign lipomatous neoplasm of skin and subcutaneous tissue of head, face and neck: Secondary | ICD-10-CM | POA: Diagnosis present

## 2015-06-20 LAB — BASIC METABOLIC PANEL
Anion gap: 12 (ref 5–15)
BUN: 14 mg/dL (ref 6–20)
CO2: 26 mmol/L (ref 22–32)
Calcium: 9.7 mg/dL (ref 8.9–10.3)
Chloride: 102 mmol/L (ref 101–111)
Creatinine, Ser: 1.07 mg/dL — ABNORMAL HIGH (ref 0.44–1.00)
GFR calc Af Amer: >60 mL/min (ref >60)
GFR calc non Af Amer: 56 mL/min — ABNORMAL LOW (ref >60)
Glucose, Bld: 89 mg/dL (ref 65–99)
Potassium: 3.5 mmol/L (ref 3.5–5.1)
Sodium: 140 mmol/L (ref 135–145)

## 2015-06-20 NOTE — H&P (Signed)
[name] 02/22/2015 3:11 PM Location: [facility] Patient #: [id number] DOB: August 14, 1955 Married / Language: English / Race: Black or African American Female   History of Present Illness Rodman Key B. Hassell Done MD; 02/22/2015 3:53 PM) The patient is a 59 year old female who presents with a complaint of Mass. Has had 3 cm mass develop on the left side of her head. This is soft and rubbery like a lipoma. It sticks out and is uncomfortable. She wants it removed because it is getting bigger and bothering her. Referred by Dr. Antony Contras for consulation.    Other Problems Malachi Bonds, CMA; 02/22/2015 3:12 PM) Arthritis Diabetes Mellitus Gastroesophageal Reflux Disease High blood pressure  Past Surgical History Malachi Bonds, CMA; 02/22/2015 3:12 PM) Hip Surgery Right. Hysterectomy (not due to cancer) - Partial Resection of Stomach  Diagnostic Studies History Malachi Bonds, CMA; 02/22/2015 3:12 PM) Colonoscopy within last year Mammogram within last year Pap Smear 1-5 years ago  Allergies Malachi Bonds, CMA; 02/22/2015 3:13 PM) Diclofenac Sodium *CHEMICALS* Penicillin G Sodium *PENICILLINS*  Medication History (Chemira Jones, CMA; 02/22/2015 3:14 PM) Albuterol Sulfate ((2.5 MG/3ML)0.083% Nebulized Soln, Inhalation) Active. AmLODIPine Besylate (2.5MG  Tablet, Oral) Active. Glimepiride (4MG  Tablet, Oral) Active. Losartan Potassium (25MG  Tablet, Oral) Active. MetFORMIN HCl (1000MG  Tablet, Oral) Active. MethylPREDNISolone (4MG  Tab Ther Pack, Oral) Active. ProAir HFA (108 (90 Base)MCG/ACT Aerosol Soln, Inhalation) Active. Symbicort (80-4.5MCG/ACT Aerosol, Inhalation) Active. Melatonin (1MG  Tablet, Oral) Active.  Social History Malachi Bonds, CMA; 02/22/2015 3:12 PM) Caffeine use Coffee. No alcohol use No drug use Tobacco use Former smoker.  Family History Malachi Bonds, CMA; 02/22/2015 3:12 PM) Arthritis Father. Diabetes Mellitus Mother. Heart  disease in female family member before age 96 Hypertension Father. Prostate Cancer Father. Respiratory Condition Mother.  Pregnancy / Birth History Malachi Bonds, CMA; 02/22/2015 3:12 PM) Age at menarche 58 years. Gravida 6 Maternal age <15 Para 5    Review of Systems Malachi Bonds CMA; 02/22/2015 3:12 PM) General Present- Weight Gain. Not Present- Appetite Loss, Chills, Fatigue, Fever, Night Sweats and Weight Loss. Skin Not Present- Change in Wart/Mole, Dryness, Hives, Jaundice, New Lesions, Non-Healing Wounds, Rash and Ulcer. HEENT Not Present- Earache, Hearing Loss, Hoarseness, Nose Bleed, Oral Ulcers, Ringing in the Ears, Seasonal Allergies, Sinus Pain, Sore Throat, Visual Disturbances, Wears glasses/contact lenses and Yellow Eyes. Respiratory Present- Chronic Cough and Wheezing. Not Present- Bloody sputum, Difficulty Breathing and Snoring. Breast Not Present- Breast Mass, Breast Pain, Nipple Discharge and Skin Changes. Cardiovascular Not Present- Chest Pain, Difficulty Breathing Lying Down, Leg Cramps, Palpitations, Rapid Heart Rate, Shortness of Breath and Swelling of Extremities. Gastrointestinal Present- Bloating. Not Present- Abdominal Pain, Bloody Stool, Change in Bowel Habits, Chronic diarrhea, Constipation, Difficulty Swallowing, Excessive gas, Gets full quickly at meals, Hemorrhoids, Indigestion, Nausea, Rectal Pain and Vomiting. Female Genitourinary Not Present- Frequency, Nocturia, Painful Urination, Pelvic Pain and Urgency. Musculoskeletal Not Present- Back Pain, Joint Pain, Joint Stiffness, Muscle Pain, Muscle Weakness and Swelling of Extremities. Neurological Not Present- Decreased Memory, Fainting, Headaches, Numbness, Seizures, Tingling, Tremor, Trouble walking and Weakness. Psychiatric Not Present- Anxiety, Bipolar, Change in Sleep Pattern, Depression, Fearful and Frequent crying. Endocrine Not Present- Cold Intolerance, Excessive Hunger, Hair Changes, Heat  Intolerance, Hot flashes and New Diabetes. Hematology Not Present- Easy Bruising, Excessive bleeding, Gland problems, HIV and Persistent Infections.  Vitals (Chemira Jones CMA; 02/22/2015 3:12 PM) 02/22/2015 3:12 PM Weight: 219 lb Height: 62in Body Surface Area: 2.08 m Body Mass Index: 40.06 kg/m  Temp.: 98.31F(Oral)  BP: 110/68 (Sitting, Left Arm,  Standard)     Physical Exam Rodman Key B. Hassell Done MD; 02/22/2015 3:49 PM) Head and Neck Note: 3 cm soft, nonfixed mass of the left parietal area of her head. covered with hair   Cardiovascular Note: SR without murmurs     Assessment & Plan Rodman Key B. 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

## 2015-06-21 ENCOUNTER — Ambulatory Visit (HOSPITAL_BASED_OUTPATIENT_CLINIC_OR_DEPARTMENT_OTHER)
Admission: RE | Admit: 2015-06-21 | Discharge: 2015-06-21 | Disposition: A | Discharge status: Home / Self Care | Payer: BC Managed Care – PPO | Source: Ambulatory Visit | Attending: Surgery | Admitting: Surgery

## 2015-06-21 ENCOUNTER — Ambulatory Visit (HOSPITAL_BASED_OUTPATIENT_CLINIC_OR_DEPARTMENT_OTHER): Payer: BC Managed Care – PPO | Admitting: Anesthesiology

## 2015-06-21 ENCOUNTER — Encounter (HOSPITAL_BASED_OUTPATIENT_CLINIC_OR_DEPARTMENT_OTHER): Payer: Self-pay | Admitting: Certified Registered"

## 2015-06-21 ENCOUNTER — Encounter (HOSPITAL_BASED_OUTPATIENT_CLINIC_OR_DEPARTMENT_OTHER)
Admission: RE | Disposition: A | Discharge status: Home / Self Care | Payer: Self-pay | Source: Ambulatory Visit | Attending: Surgery

## 2015-06-21 DIAGNOSIS — Z6838 Body mass index (BMI) 38.0-38.9, adult: Secondary | ICD-10-CM | POA: Insufficient documentation

## 2015-06-21 DIAGNOSIS — I10 Essential (primary) hypertension: Secondary | ICD-10-CM | POA: Insufficient documentation

## 2015-06-21 DIAGNOSIS — D170 Benign lipomatous neoplasm of skin and subcutaneous tissue of head, face and neck: Secondary | ICD-10-CM | POA: Diagnosis not present

## 2015-06-21 DIAGNOSIS — Z794 Long term (current) use of insulin: Secondary | ICD-10-CM | POA: Insufficient documentation

## 2015-06-21 DIAGNOSIS — Z87891 Personal history of nicotine dependence: Secondary | ICD-10-CM | POA: Insufficient documentation

## 2015-06-21 DIAGNOSIS — E119 Type 2 diabetes mellitus without complications: Secondary | ICD-10-CM | POA: Insufficient documentation

## 2015-06-21 HISTORY — DX: Unspecified osteoarthritis, unspecified site: M19.90

## 2015-06-21 HISTORY — DX: Cough: R05

## 2015-06-21 HISTORY — DX: Gastro-esophageal reflux disease without esophagitis: K21.9

## 2015-06-21 HISTORY — DX: Chronic cough: R05.3

## 2015-06-21 HISTORY — PX: LIPOMA EXCISION: SHX5283

## 2015-06-21 HISTORY — DX: Reserved for inherently not codable concepts without codable children: IMO0001

## 2015-06-21 LAB — GLUCOSE, CAPILLARY
Glucose-Capillary: 109 mg/dL — ABNORMAL HIGH (ref 65–99)
Glucose-Capillary: 101 mg/dL — ABNORMAL HIGH (ref 65–99)

## 2015-06-21 SURGERY — EXCISION LIPOMA
Anesthesia: Monitor Anesthesia Care | Site: Scalp | Laterality: Left

## 2015-06-21 MED ORDER — HEPARIN SODIUM (PORCINE) 5000 UNIT/ML IJ SOLN: 5000.0000 [IU] | Freq: Once | INTRAMUSCULAR | Status: DC

## 2015-06-21 MED ORDER — LIDOCAINE HCL (CARDIAC) 20 MG/ML IV SOLN
INTRAVENOUS | Status: AC
  Filled 2015-06-21: qty 5

## 2015-06-21 MED ORDER — CHLORHEXIDINE GLUCONATE 4 % EX LIQD: 1.0000 "application " | Freq: Once | CUTANEOUS | Status: DC

## 2015-06-21 MED ORDER — ONDANSETRON HCL 4 MG/2ML IJ SOLN: 4.0000 mg | Freq: Once | INTRAMUSCULAR | Status: DC | PRN

## 2015-06-21 MED ORDER — LACTATED RINGERS IV SOLN
INTRAVENOUS | Status: DC
  Administered 2015-06-21: 13:00:00 via INTRAVENOUS

## 2015-06-21 MED ORDER — FENTANYL CITRATE (PF) 100 MCG/2ML IJ SOLN
50.0000 ug | INTRAMUSCULAR | Status: AC | PRN
  Administered 2015-06-21 (×2): 25 ug via INTRAVENOUS
  Administered 2015-06-21: 50 ug via INTRAVENOUS

## 2015-06-21 MED ORDER — SCOPOLAMINE 1 MG/3DAYS TD PT72: 1.0000 | MEDICATED_PATCH | Freq: Once | TRANSDERMAL | Status: DC | PRN

## 2015-06-21 MED ORDER — LIDOCAINE-EPINEPHRINE (PF) 1 %-1:200000 IJ SOLN
INTRAMUSCULAR | Status: AC
  Filled 2015-06-21: qty 30

## 2015-06-21 MED ORDER — MIDAZOLAM HCL 2 MG/2ML IJ SOLN
1.0000 mg | INTRAMUSCULAR | Status: DC | PRN
  Administered 2015-06-21 (×2): 1 mg via INTRAVENOUS

## 2015-06-21 MED ORDER — ONDANSETRON HCL 4 MG/2ML IJ SOLN
INTRAMUSCULAR | Status: AC
  Filled 2015-06-21: qty 2

## 2015-06-21 MED ORDER — PROPOFOL 10 MG/ML IV BOLUS
INTRAVENOUS | Status: DC | PRN
  Administered 2015-06-21 (×2): 20 mg via INTRAVENOUS
  Administered 2015-06-21: 10 mg via INTRAVENOUS
  Administered 2015-06-21 (×2): 20 mg via INTRAVENOUS

## 2015-06-21 MED ORDER — ONDANSETRON HCL 4 MG/2ML IJ SOLN
INTRAMUSCULAR | Status: DC | PRN
  Administered 2015-06-21: 4 mg via INTRAVENOUS

## 2015-06-21 MED ORDER — HYDROCODONE-ACETAMINOPHEN 5-325 MG PO TABS: 1.0000 | ORAL_TABLET | ORAL | Status: DC | PRN

## 2015-06-21 MED ORDER — BUPIVACAINE HCL (PF) 0.5 % IJ SOLN
INTRAMUSCULAR | Status: AC
  Filled 2015-06-21: qty 30

## 2015-06-21 MED ORDER — SODIUM BICARBONATE 4 % IV SOLN
INTRAVENOUS | Status: AC
  Filled 2015-06-21: qty 5

## 2015-06-21 MED ORDER — FENTANYL CITRATE (PF) 100 MCG/2ML IJ SOLN
25.0000 ug | INTRAMUSCULAR | Status: DC | PRN
Start: 2015-06-21 — End: 2015-06-21

## 2015-06-21 MED ORDER — CEFAZOLIN SODIUM 1 G IJ SOLR
INTRAMUSCULAR | Status: AC
  Filled 2015-06-21: qty 10

## 2015-06-21 MED ORDER — GLYCOPYRROLATE 0.2 MG/ML IJ SOLN: 0.2000 mg | Freq: Once | INTRAMUSCULAR | Status: DC | PRN

## 2015-06-21 MED ORDER — FENTANYL CITRATE (PF) 100 MCG/2ML IJ SOLN
INTRAMUSCULAR | Status: AC
  Filled 2015-06-21: qty 2

## 2015-06-21 MED ORDER — SODIUM BICARBONATE 4 % IV SOLN
INTRAVENOUS | Status: DC | PRN
  Administered 2015-06-21: 5 mL via INTRAMUSCULAR

## 2015-06-21 MED ORDER — PROPOFOL 10 MG/ML IV BOLUS
INTRAVENOUS | Status: AC
  Filled 2015-06-21: qty 20

## 2015-06-21 MED ORDER — LIDOCAINE HCL (CARDIAC) 20 MG/ML IV SOLN
INTRAVENOUS | Status: DC | PRN
  Administered 2015-06-21: 25 mg via INTRAVENOUS

## 2015-06-21 MED ORDER — MIDAZOLAM HCL 2 MG/2ML IJ SOLN
INTRAMUSCULAR | Status: AC
  Filled 2015-06-21: qty 2

## 2015-06-21 SURGICAL SUPPLY — 48 items
APL SKNCLS STERI-STRIP NONHPOA (GAUZE/BANDAGES/DRESSINGS)
BENZOIN TINCTURE PRP APPL 2/3 (GAUZE/BANDAGES/DRESSINGS) IMPLANT
BLADE CLIPPER SURG (BLADE) IMPLANT
BLADE SURG 15 STRL LF DISP TIS (BLADE) ×1 IMPLANT
BLADE SURG 15 STRL SS (BLADE) ×2
CANISTER SUCT 1200ML W/VALVE (MISCELLANEOUS) IMPLANT
CLEANER CAUTERY TIP 5X5 PAD (MISCELLANEOUS) ×1 IMPLANT
COVER BACK TABLE 60X90IN (DRAPES) ×2 IMPLANT
COVER MAYO STAND STRL (DRAPES) ×2 IMPLANT
DECANTER SPIKE VIAL GLASS SM (MISCELLANEOUS) ×1 IMPLANT
DRAPE LAPAROTOMY 100X72 PEDS (DRAPES) IMPLANT
DRAPE U-SHAPE 76X120 STRL (DRAPES) ×1 IMPLANT
ELECT REM PT RETURN 9FT ADLT (ELECTROSURGICAL) ×2
ELECTRODE REM PT RTRN 9FT ADLT (ELECTROSURGICAL) ×1 IMPLANT
GLOVE BIO SURGEON STRL SZ8 (GLOVE) ×2 IMPLANT
GLOVE BIOGEL M 7.0 STRL (GLOVE) ×1 IMPLANT
GLOVE BIOGEL PI IND STRL 7.5 (GLOVE) IMPLANT
GLOVE BIOGEL PI INDICATOR 7.5 (GLOVE) ×1
GOWN STRL REUS W/ TWL LRG LVL3 (GOWN DISPOSABLE) ×1 IMPLANT
GOWN STRL REUS W/ TWL XL LVL3 (GOWN DISPOSABLE) ×1 IMPLANT
GOWN STRL REUS W/TWL LRG LVL3 (GOWN DISPOSABLE) ×2
GOWN STRL REUS W/TWL XL LVL3 (GOWN DISPOSABLE) ×2
LIQUID BAND (GAUZE/BANDAGES/DRESSINGS) ×1 IMPLANT
NDL HYPO 25X1 1.5 SAFETY (NEEDLE) ×1 IMPLANT
NDL PRECISIONGLIDE 27X1.5 (NEEDLE) IMPLANT
NEEDLE HYPO 25X1 1.5 SAFETY (NEEDLE) ×2 IMPLANT
NEEDLE PRECISIONGLIDE 27X1.5 (NEEDLE) IMPLANT
NS IRRIG 1000ML POUR BTL (IV SOLUTION) ×1 IMPLANT
PACK BASIN DAY SURGERY FS (CUSTOM PROCEDURE TRAY) ×2 IMPLANT
PAD CLEANER CAUTERY TIP 5X5 (MISCELLANEOUS)
PENCIL BUTTON HOLSTER BLD 10FT (ELECTRODE) ×2 IMPLANT
SLEEVE SCD COMPRESS KNEE MED (MISCELLANEOUS) ×2 IMPLANT
SPONGE GAUZE 4X4 12PLY STER LF (GAUZE/BANDAGES/DRESSINGS) ×2 IMPLANT
STRIP CLOSURE SKIN 1/2X4 (GAUZE/BANDAGES/DRESSINGS) IMPLANT
SUT ETHILON 3 0 FSL (SUTURE) IMPLANT
SUT ETHILON 5 0 PS 2 18 (SUTURE) IMPLANT
SUT VIC AB 3-0 SH 27 (SUTURE)
SUT VIC AB 3-0 SH 27X BRD (SUTURE) IMPLANT
SUT VIC AB 4-0 SH 18 (SUTURE) ×2 IMPLANT
SUT VIC AB 5-0 PS2 18 (SUTURE) IMPLANT
SUT VICRYL 3-0 CR8 SH (SUTURE) IMPLANT
SYR BULB 3OZ (MISCELLANEOUS) ×2 IMPLANT
SYR CONTROL 10ML LL (SYRINGE) ×2 IMPLANT
TOWEL OR 17X24 6PK STRL BLUE (TOWEL DISPOSABLE) ×2 IMPLANT
TRAY DSU PREP LF (CUSTOM PROCEDURE TRAY) ×2 IMPLANT
TUBE CONNECTING 20X1/4 (TUBING) IMPLANT
UNDERPAD 30X30 (UNDERPADS AND DIAPERS) ×1 IMPLANT
YANKAUER SUCT BULB TIP NO VENT (SUCTIONS) IMPLANT

## 2015-06-21 NOTE — Transfer of Care (Signed)
Immediate Anesthesia Transfer of Care Note  Patient: [name]  Procedure(s) Performed: Procedure(s): EXCISION OF LEFT SCALP LIPOMA (Left)  Patient Location: PACU  Anesthesia Type:MAC  Level of Consciousness: awake, alert  and oriented  Airway & Oxygen Therapy: Patient Spontanous Breathing, SaO2 100 % RA  Post-op Assessment: Report given to RN, Post -op Vital signs reviewed and stable and Patient moving all extremities  Post vital signs: Reviewed and stable  Last Vitals:  Filed Vitals:   06/21/15 1242  BP: 114/81  Pulse: 73  Temp: 36.8 C  Resp: 20    Complications: No apparent anesthesia complications

## 2015-06-21 NOTE — Op Note (Signed)
Surgeon: Kaylyn Lim, MD, FACS  Asst:  none  Anes:  Local MAC  Procedure: Excision of 2.5 cm mass of the left scalp  Diagnosis: Left scalp mass  Complications: none  EBL:   minimal cc  Drains: none  Description of Procedure:  The patient was taken to OR 3 at [facility].  After anesthesia was administered and the patient was prepped a timeout was performed.  The mass had been marked in the holding area.  It was infiltrated with 1% with neut.  Her braids were prepped and no hair cut.  A transverse incision was made and carried down to below the subcutaneous layer where a pseudocapsule was incised and a large fatty mass was excised in toto.  There was very minimal bleeding.  The resultant defect was irrigated and closed with 4-0 vicryl and Dermabond.    The patient tolerated the procedure well and was taken to the PACU in stable condition.     Matt B. Hassell Done, Lily, [facility], [place]

## 2015-06-21 NOTE — Anesthesia Procedure Notes (Signed)
Procedure Name: MAC Date/Time: 06/21/2015 2:12 PM Performed by: Baxter Flattery Pre-anesthesia Checklist: Patient identified, Emergency Drugs available, Suction available and Patient being monitored Patient Re-evaluated:Patient Re-evaluated prior to inductionOxygen Delivery Method: Simple face mask Preoxygenation: Pre-oxygenation with 100% oxygen Intubation Type: IV induction Dental Injury: Teeth and Oropharynx as per pre-operative assessment

## 2015-06-21 NOTE — Anesthesia Preprocedure Evaluation (Addendum)
Anesthesia Evaluation  Patient identified by MRN, date of birth, ID band Patient awake    Reviewed: Allergy & Precautions, H&P , NPO status , Patient's Chart, lab work & pertinent test results  Airway Mallampati: II  TM Distance: >3 FB Neck ROM: Full    Dental  (+) Dental Advisory Given, Missing, Poor Dentition   Pulmonary asthma , former smoker,    Pulmonary exam normal breath sounds clear to auscultation       Cardiovascular hypertension, Pt. on medications Normal cardiovascular exam Rhythm:Regular Rate:Normal     Neuro/Psych    GI/Hepatic   Endo/Other  diabetes, Type 2, Oral Hypoglycemic AgentsMorbid obesity  Renal/GU      Musculoskeletal   Abdominal   Peds  Hematology   Anesthesia Other Findings   Reproductive/Obstetrics                            Anesthesia Physical  Anesthesia Plan  ASA: III  Anesthesia Plan: MAC   Post-op Pain Management:    Induction: Intravenous  Airway Management Planned: Simple Face Mask  Additional Equipment:   Intra-op Plan:   Post-operative Plan: Extubation in OR  Informed Consent: I have reviewed the patients History and Physical, chart, labs and discussed the procedure including the risks, benefits and alternatives for the proposed anesthesia with the patient or authorized representative who has indicated his/her understanding and acceptance.   Dental advisory given  Plan Discussed with: CRNA and Anesthesiologist  Anesthesia Plan Comments:         Anesthesia Quick Evaluation

## 2015-06-21 NOTE — Discharge Instructions (Addendum)
May shower and wash hair tomorrow Followup with Dr. Hassell Done in 3 weeks.    Post Anesthesia Home Care Instructions  Activity: Get plenty of rest for the remainder of the day. A responsible adult should stay with you for 24 hours following the procedure.  For the next 24 hours, DO NOT: -Drive a car -Paediatric nurse -Drink alcoholic beverages -Take any medication unless instructed by your physician -Make any legal decisions or sign important papers.  Meals: Start with liquid foods such as gelatin or soup. Progress to regular foods as tolerated. Avoid greasy, spicy, heavy foods. If nausea and/or vomiting occur, drink only clear liquids until the nausea and/or vomiting subsides. Call your physician if vomiting continues.  Special Instructions/Symptoms: Your throat may feel dry or sore from the anesthesia or the breathing tube placed in your throat during surgery. If this causes discomfort, gargle with warm salt water. The discomfort should disappear within 24 hours.  If you had a scopolamine patch placed behind your ear for the management of post- operative nausea and/or vomiting:  1. The medication in the patch is effective for 72 hours, after which it should be removed.  Wrap patch in a tissue and discard in the trash. Wash hands thoroughly with soap and water. 2. You may remove the patch earlier than 72 hours if you experience unpleasant side effects which may include dry mouth, dizziness or visual disturbances. 3. Avoid touching the patch. Wash your hands with soap and water after contact with the patch.

## 2015-06-21 NOTE — H&P (View-Only) (Signed)
[name] 02/22/2015 3:11 PM Location: [facility] Patient #: [id number] DOB: Oct 01, 1955 Married / Language: English / Race: Black or African American Female   History of Present Illness Rodman Key B. Hassell Done MD; 02/22/2015 3:53 PM) The patient is a 59 year old female who presents with a complaint of Mass. Has had 3 cm mass develop on the left side of her head. This is soft and rubbery like a lipoma. It sticks out and is uncomfortable. She wants it removed because it is getting bigger and bothering her. Referred by Dr. Antony Contras for consulation.    Other Problems Malachi Bonds, CMA; 02/22/2015 3:12 PM) Arthritis Diabetes Mellitus Gastroesophageal Reflux Disease High blood pressure  Past Surgical History Malachi Bonds, CMA; 02/22/2015 3:12 PM) Hip Surgery Right. Hysterectomy (not due to cancer) - Partial Resection of Stomach  Diagnostic Studies History Malachi Bonds, CMA; 02/22/2015 3:12 PM) Colonoscopy within last year Mammogram within last year Pap Smear 1-5 years ago  Allergies Malachi Bonds, CMA; 02/22/2015 3:13 PM) Diclofenac Sodium *CHEMICALS* Penicillin G Sodium *PENICILLINS*  Medication History (Chemira Jones, CMA; 02/22/2015 3:14 PM) Albuterol Sulfate ((2.5 MG/3ML)0.083% Nebulized Soln, Inhalation) Active. AmLODIPine Besylate (2.5MG  Tablet, Oral) Active. Glimepiride (4MG  Tablet, Oral) Active. Losartan Potassium (25MG  Tablet, Oral) Active. MetFORMIN HCl (1000MG  Tablet, Oral) Active. MethylPREDNISolone (4MG  Tab Ther Pack, Oral) Active. ProAir HFA (108 (90 Base)MCG/ACT Aerosol Soln, Inhalation) Active. Symbicort (80-4.5MCG/ACT Aerosol, Inhalation) Active. Melatonin (1MG  Tablet, Oral) Active.  Social History Malachi Bonds, CMA; 02/22/2015 3:12 PM) Caffeine use Coffee. No alcohol use No drug use Tobacco use Former smoker.  Family History Malachi Bonds, CMA; 02/22/2015 3:12 PM) Arthritis Father. Diabetes Mellitus Mother. Heart  disease in female family member before age 43 Hypertension Father. Prostate Cancer Father. Respiratory Condition Mother.  Pregnancy / Birth History Malachi Bonds, CMA; 02/22/2015 3:12 PM) Age at menarche 84 years. Gravida 6 Maternal age <15 Para 5    Review of Systems Malachi Bonds CMA; 02/22/2015 3:12 PM) General Present- Weight Gain. Not Present- Appetite Loss, Chills, Fatigue, Fever, Night Sweats and Weight Loss. Skin Not Present- Change in Wart/Mole, Dryness, Hives, Jaundice, New Lesions, Non-Healing Wounds, Rash and Ulcer. HEENT Not Present- Earache, Hearing Loss, Hoarseness, Nose Bleed, Oral Ulcers, Ringing in the Ears, Seasonal Allergies, Sinus Pain, Sore Throat, Visual Disturbances, Wears glasses/contact lenses and Yellow Eyes. Respiratory Present- Chronic Cough and Wheezing. Not Present- Bloody sputum, Difficulty Breathing and Snoring. Breast Not Present- Breast Mass, Breast Pain, Nipple Discharge and Skin Changes. Cardiovascular Not Present- Chest Pain, Difficulty Breathing Lying Down, Leg Cramps, Palpitations, Rapid Heart Rate, Shortness of Breath and Swelling of Extremities. Gastrointestinal Present- Bloating. Not Present- Abdominal Pain, Bloody Stool, Change in Bowel Habits, Chronic diarrhea, Constipation, Difficulty Swallowing, Excessive gas, Gets full quickly at meals, Hemorrhoids, Indigestion, Nausea, Rectal Pain and Vomiting. Female Genitourinary Not Present- Frequency, Nocturia, Painful Urination, Pelvic Pain and Urgency. Musculoskeletal Not Present- Back Pain, Joint Pain, Joint Stiffness, Muscle Pain, Muscle Weakness and Swelling of Extremities. Neurological Not Present- Decreased Memory, Fainting, Headaches, Numbness, Seizures, Tingling, Tremor, Trouble walking and Weakness. Psychiatric Not Present- Anxiety, Bipolar, Change in Sleep Pattern, Depression, Fearful and Frequent crying. Endocrine Not Present- Cold Intolerance, Excessive Hunger, Hair Changes, Heat  Intolerance, Hot flashes and New Diabetes. Hematology Not Present- Easy Bruising, Excessive bleeding, Gland problems, HIV and Persistent Infections.  Vitals (Chemira Jones CMA; 02/22/2015 3:12 PM) 02/22/2015 3:12 PM Weight: 219 lb Height: 62in Body Surface Area: 2.08 m Body Mass Index: 40.06 kg/m  Temp.: 98.86F(Oral)  BP: 110/68 (Sitting, Left Arm,  Standard)     Physical Exam Rodman Key B. Hassell Done MD; 02/22/2015 3:49 PM) Head and Neck Note: 3 cm soft, nonfixed mass of the left parietal area of her head. covered with hair   Cardiovascular Note: SR without murmurs     Assessment & Plan Rodman Key B. 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

## 2015-06-21 NOTE — Interval H&P Note (Signed)
History and Physical Interval Note:  06/21/2015 1:55 PM  [name]  has presented today for surgery, with the diagnosis of LEFT SCALP LIPOMA 3CM  The various methods of treatment have been discussed with the patient and family. After consideration of risks, benefits and other options for treatment, the patient has consented to  Procedure(s): EXCISION OF LEFT SCALP LIPOMA (Left) as a surgical intervention .  The patient's history has been reviewed, patient examined, no change in status, stable for surgery.  I have reviewed the patient's chart and labs.  Questions were answered to the patient's satisfaction.     Weiland Tomich B

## 2015-06-21 NOTE — Anesthesia Postprocedure Evaluation (Signed)
Anesthesia Post Note  Patient: [name]  Procedure(s) Performed: Procedure(s) (LRB): EXCISION OF LEFT SCALP LIPOMA (Left)  Patient location during evaluation: PACU Anesthesia Type: MAC Level of consciousness: awake and alert Pain management: pain level controlled Vital Signs Assessment: post-procedure vital signs reviewed and stable Respiratory status: spontaneous breathing, nonlabored ventilation and respiratory function stable Cardiovascular status: stable and blood pressure returned to baseline Anesthetic complications: no    Last Vitals:  Filed Vitals:   06/21/15 1500 06/21/15 1515  BP: 116/70 108/77  Pulse: 58 78  Temp:    Resp: 16 16    Last Pain: There were no vitals filed for this visit.               Zenaida Deed

## 2015-06-24 ENCOUNTER — Telehealth: Payer: Self-pay | Admitting: General Surgery

## 2015-06-24 NOTE — Telephone Encounter (Signed)
Pt having nausea.  S/p excision of lipoma.  Taking narcotics every 4 h.  Denies nausea after taking meds and taking with food.  Only has had one BM since surgery.  Nausea possibly due to constipation.  Recommended that she use stool softeners and try limiting her narcotic use and switch to OTC pain meds.  She will call the office if this persists.

## 2015-06-25 ENCOUNTER — Encounter (HOSPITAL_BASED_OUTPATIENT_CLINIC_OR_DEPARTMENT_OTHER): Payer: Self-pay | Admitting: Surgery

## 2015-07-25 ENCOUNTER — Other Ambulatory Visit: Payer: Self-pay

## 2015-07-25 DIAGNOSIS — Z1231 Encounter for screening mammogram for malignant neoplasm of breast: Secondary | ICD-10-CM

## 2015-08-13 ENCOUNTER — Inpatient Hospital Stay (HOSPITAL_COMMUNITY)
Admission: EM | Admit: 2015-08-13 | Discharge: 2015-08-21 | DRG: 439 | Disposition: A | Discharge status: Home / Self Care | Payer: BC Managed Care – PPO | Attending: Internal Medicine | Admitting: Internal Medicine

## 2015-08-13 ENCOUNTER — Emergency Department (HOSPITAL_COMMUNITY): Payer: BC Managed Care – PPO

## 2015-08-13 ENCOUNTER — Ambulatory Visit: Payer: BC Managed Care – PPO

## 2015-08-13 ENCOUNTER — Encounter (HOSPITAL_COMMUNITY): Payer: Self-pay | Admitting: Emergency Medicine

## 2015-08-13 DIAGNOSIS — K219 Gastro-esophageal reflux disease without esophagitis: Secondary | ICD-10-CM | POA: Diagnosis present

## 2015-08-13 DIAGNOSIS — K859 Acute pancreatitis without necrosis or infection, unspecified: Principal | ICD-10-CM | POA: Diagnosis present

## 2015-08-13 DIAGNOSIS — M1990 Unspecified osteoarthritis, unspecified site: Secondary | ICD-10-CM | POA: Diagnosis present

## 2015-08-13 DIAGNOSIS — E119 Type 2 diabetes mellitus without complications: Secondary | ICD-10-CM | POA: Diagnosis present

## 2015-08-13 DIAGNOSIS — R748 Abnormal levels of other serum enzymes: Secondary | ICD-10-CM | POA: Diagnosis present

## 2015-08-13 DIAGNOSIS — R1013 Epigastric pain: Secondary | ICD-10-CM | POA: Diagnosis not present

## 2015-08-13 DIAGNOSIS — Z96641 Presence of right artificial hip joint: Secondary | ICD-10-CM | POA: Diagnosis present

## 2015-08-13 DIAGNOSIS — H409 Unspecified glaucoma: Secondary | ICD-10-CM | POA: Diagnosis present

## 2015-08-13 DIAGNOSIS — R935 Abnormal findings on diagnostic imaging of other abdominal regions, including retroperitoneum: Secondary | ICD-10-CM | POA: Diagnosis not present

## 2015-08-13 DIAGNOSIS — Z87891 Personal history of nicotine dependence: Secondary | ICD-10-CM

## 2015-08-13 DIAGNOSIS — E876 Hypokalemia: Secondary | ICD-10-CM | POA: Diagnosis present

## 2015-08-13 DIAGNOSIS — I10 Essential (primary) hypertension: Secondary | ICD-10-CM | POA: Diagnosis present

## 2015-08-13 DIAGNOSIS — B961 Klebsiella pneumoniae [K. pneumoniae] as the cause of diseases classified elsewhere: Secondary | ICD-10-CM | POA: Diagnosis present

## 2015-08-13 DIAGNOSIS — I959 Hypotension, unspecified: Secondary | ICD-10-CM | POA: Diagnosis present

## 2015-08-13 DIAGNOSIS — Z7982 Long term (current) use of aspirin: Secondary | ICD-10-CM

## 2015-08-13 DIAGNOSIS — K851 Biliary acute pancreatitis without necrosis or infection: Secondary | ICD-10-CM | POA: Diagnosis not present

## 2015-08-13 DIAGNOSIS — K2980 Duodenitis without bleeding: Secondary | ICD-10-CM | POA: Diagnosis present

## 2015-08-13 DIAGNOSIS — N390 Urinary tract infection, site not specified: Secondary | ICD-10-CM | POA: Diagnosis present

## 2015-08-13 DIAGNOSIS — R52 Pain, unspecified: Secondary | ICD-10-CM

## 2015-08-13 DIAGNOSIS — Z90710 Acquired absence of both cervix and uterus: Secondary | ICD-10-CM | POA: Diagnosis not present

## 2015-08-13 DIAGNOSIS — R1033 Periumbilical pain: Secondary | ICD-10-CM | POA: Diagnosis not present

## 2015-08-13 DIAGNOSIS — R109 Unspecified abdominal pain: Secondary | ICD-10-CM | POA: Diagnosis present

## 2015-08-13 DIAGNOSIS — K8500 Idiopathic acute pancreatitis without necrosis or infection: Secondary | ICD-10-CM | POA: Diagnosis not present

## 2015-08-13 LAB — COMPREHENSIVE METABOLIC PANEL
ALT: 14 U/L (ref 14–54)
AST: 13 U/L — ABNORMAL LOW (ref 15–41)
Albumin: 4.1 g/dL (ref 3.5–5.0)
Alkaline Phosphatase: 79 U/L (ref 38–126)
Anion gap: 9 (ref 5–15)
BUN: 15 mg/dL (ref 6–20)
CO2: 29 mmol/L (ref 22–32)
Calcium: 9.9 mg/dL (ref 8.9–10.3)
Chloride: 99 mmol/L — ABNORMAL LOW (ref 101–111)
Creatinine, Ser: 0.82 mg/dL (ref 0.44–1.00)
GFR calc Af Amer: >60 mL/min (ref >60)
GFR calc non Af Amer: >60 mL/min (ref >60)
Glucose, Bld: 128 mg/dL — ABNORMAL HIGH (ref 65–99)
Potassium: 3.6 mmol/L (ref 3.5–5.1)
Sodium: 137 mmol/L (ref 135–145)
Total Bilirubin: 0.6 mg/dL (ref 0.3–1.2)
Total Protein: 7.4 g/dL (ref 6.5–8.1)

## 2015-08-13 LAB — CBC
HCT: 39.4 % (ref 36.0–46.0)
Hemoglobin: 13 g/dL (ref 12.0–15.0)
MCH: 28.8 pg (ref 26.0–34.0)
MCHC: 33 g/dL (ref 30.0–36.0)
MCV: 87.4 fL (ref 78.0–100.0)
Platelets: 358 10*3/uL (ref 150–400)
RBC: 4.51 MIL/uL (ref 3.87–5.11)
RDW: 13.7 % (ref 11.5–15.5)
WBC: 9.1 10*3/uL (ref 4.0–10.5)

## 2015-08-13 LAB — URINE MICROSCOPIC-ADD ON

## 2015-08-13 LAB — URINALYSIS, ROUTINE W REFLEX MICROSCOPIC
Bilirubin Urine: NEGATIVE
Glucose, UA: NEGATIVE mg/dL
Ketones, ur: NEGATIVE mg/dL
Nitrite: POSITIVE — AB
Protein, ur: NEGATIVE mg/dL
Specific Gravity, Urine: 1.028 (ref 1.005–1.030)
pH: 5.5 (ref 5.0–8.0)

## 2015-08-13 LAB — LIPASE, BLOOD: Lipase: 58 U/L — ABNORMAL HIGH (ref 11–51)

## 2015-08-13 MED ORDER — METOCLOPRAMIDE HCL 5 MG/ML IJ SOLN
5.0000 mg | Freq: Once | INTRAMUSCULAR | Status: AC
  Administered 2015-08-13: 5 mg via INTRAVENOUS
  Filled 2015-08-13: qty 2

## 2015-08-13 MED ORDER — DEXTROSE 5 % IV SOLN
1.0000 g | Freq: Once | INTRAVENOUS | Status: AC
  Administered 2015-08-13: 1 g via INTRAVENOUS
  Filled 2015-08-13: qty 10

## 2015-08-13 MED ORDER — MORPHINE SULFATE (PF) 4 MG/ML IV SOLN
4.0000 mg | Freq: Once | INTRAVENOUS | Status: AC
  Administered 2015-08-13: 4 mg via INTRAVENOUS
  Filled 2015-08-13: qty 1

## 2015-08-13 MED ORDER — HYDROMORPHONE HCL 1 MG/ML IJ SOLN
1.0000 mg | Freq: Once | INTRAMUSCULAR | Status: AC
  Administered 2015-08-13: 1 mg via INTRAVENOUS
  Filled 2015-08-13: qty 1

## 2015-08-13 MED ORDER — SODIUM CHLORIDE 0.9 % IV SOLN
Freq: Once | INTRAVENOUS | Status: AC
  Administered 2015-08-13: 22:00:00 via INTRAVENOUS

## 2015-08-13 MED ORDER — PANTOPRAZOLE SODIUM 40 MG IV SOLR
40.0000 mg | INTRAVENOUS | Status: DC
  Administered 2015-08-13: 40 mg via INTRAVENOUS
  Filled 2015-08-13: qty 40

## 2015-08-13 MED ORDER — IOHEXOL 300 MG/ML  SOLN
100.0000 mL | Freq: Once | INTRAMUSCULAR | Status: AC | PRN
  Administered 2015-08-13: 100 mL via INTRAVENOUS

## 2015-08-13 NOTE — ED Notes (Signed)
PT aware that urine sample is needed but unable to urinate at this time.

## 2015-08-13 NOTE — H&P (Signed)
[facility] History and Physical  [name] [id number] DOB: 05/21/1956 DOA: 08/13/2015  Referring physician: Orlie Dakin, MD PCP: Gara Kroner, MD   Chief Complaint: Abdominal pain.  HPI: [name] is a 60 y.o. female with a past medical history hypertension, type 2 diabetes, anemia, GLA deficiency, glaucoma, osteoarthritis, chronic cough, GERD who is coming to the emergency department due to abdominal pain for the past 4 days associated with nausea.  Per patient, the pain started on Friday evening after she Back from work. She is states that her symptoms did not began after her meal. She was treating herself pain with Percocet with some improvement, but went to see her primary care provider today who last her to come to the emergency department. She denies fever, chills, emesis, diarrhea, constipation, melena or hematochezia.   When seen, patient was in no acute distress. She stated that she was feeling better with the medications and IV hydration. Workup in the emergency department shows a elevated lipase level with subtle stranding around the inferior pancreatic head/uncinate process and along the medial wall of the distal descending duodenum and suggesting edema. She denies history of alcohol abuse, hyperlipidemia, but is taking hydrochlorothiazide daily for hypertension.    Review of Systems:  Constitutional:  No weight loss, night sweats, Fevers, chills, fatigue.  HEENT:  No headaches, Difficulty swallowing,Tooth/dental problems,Sore throat,  No sneezing, itching, ear ache, nasal congestion, post nasal drip,  Cardio-vascular:  No chest pain, Orthopnea, PND, swelling in lower extremities, anasarca, dizziness, palpitations  GI:  As above mentioned. Resp:  No shortness of breath with exertion or at rest. No excess mucus, no productive cough, No non-productive cough, No coughing up of blood.No change in color of mucus.No wheezing.No chest wall deformity    Skin:  no rash or lesions.  GU:  no dysuria, change in color of urine, no urgency or frequency. No flank pain.  Musculoskeletal:  No joint pain or swelling. No decreased range of motion. No back pain.  Psych:  No change in mood or affect. No depression or anxiety. No memory loss.   Past Medical History  Diagnosis Date  . Hypertension   . Diabetes mellitus without complication ([place])   . Anemia 2006    required transfusion post TAH/BSO 05/2005  . GLA deficiency ([place])   . Glaucoma of both eyes   . Shortness of breath dyspnea   . Arthritis   . Chronic cough   . GERD (gastroesophageal reflux disease)     on prilosec   Past Surgical History  Procedure Laterality Date  . Total abdominal hysterectomy w/ bilateral salpingoophorectomy  05/2005  . Hysteroscopy w/d&c  01/2005    for uterine fibroids.   . Esophagogastroduodenoscopy N/A 08/22/2013    Procedure: ESOPHAGOGASTRODUODENOSCOPY (EGD);  Surgeon: Irene Shipper, MD;  Location: [facility] ENDOSCOPY;  Service: Endoscopy;  Laterality: N/A;  . Colonoscopy N/A 08/22/2013    Procedure: COLONOSCOPY;  Surgeon: Irene Shipper, MD;  Location: [place];  Service: Endoscopy;  Laterality: N/A;  . Ventral hernia repair N/A 08/24/2013    Procedure: HERNIA REPAIR VENTRAL ADULT;  Surgeon: Edward Jolly, MD;  Location: [place];  Service: General;  Laterality: N/A;  . Insertion of mesh N/A 08/24/2013    Procedure: INSERTION OF MESH;  Surgeon: Edward Jolly, MD;  Location: [place];  Service: General;  Laterality: N/A;  . Panniculectomy N/A 08/24/2013    Procedure: PANNICULECTOMY;  Surgeon: Edward Jolly, MD;  Location: [place];  Service: General;  Laterality: N/A;  . Total hip arthroplasty Right 12/12/2013    Procedure: RIGHT TOTAL HIP ARTHROPLASTY ANTERIOR APPROACH;  Surgeon: Mcarthur Rossetti, MD;  Location: [place];  Service: Orthopedics;  Laterality: Right;  . Abdominal hysterectomy    . Hernia repair    . Joint replacement Right   . Lipoma  excision Left 06/21/2015    Procedure: EXCISION OF LEFT SCALP LIPOMA;  Surgeon: Johnathan Hausen, MD;  Location: [place];  Service: General;  Laterality: Left;   Social History:  reports that she quit smoking about 37 years ago. Her smoking use included Cigarettes. She has a 3.75 pack-year smoking history. She has never used smokeless tobacco. She reports that she does not drink alcohol or use illicit drugs.  Allergies  Allergen Reactions  . Diclofenac     Hives   . Penicillins Hives    Has patient had a PCN reaction causing immediate rash, facial/tongue/throat swelling, SOB or lightheadedness with hypotension: Yes Has patient had a PCN reaction causing severe rash involving mucus membranes or skin necrosis: Yes Has patient had a PCN reaction that required hospitalization No Has patient had a PCN reaction occurring within the last 10 years: No. If all of the above answers are "NO", then may proceed with Cephalosporin use.     Family History  Problem Relation Age of Onset  . Emphysema Mother     smoked  . Colon cancer Father     Prior to Admission medications   Medication Sig Start Date End Date Taking? Authorizing Provider  amLODipine (NORVASC) 2.5 MG tablet Take 2.5 mg by mouth daily.   Yes Historical Provider, MD  aspirin 81 MG tablet Take 81 mg by mouth daily.   Yes Historical Provider, MD  azithromycin (ZITHROMAX) 250 MG tablet Take 250-500 mg by mouth daily. ABT Start Date 08/10/15 & End Date 08/14/15. Med Instructions:Take 2 tablets on Day 1 then Take 1 tablet daily for 4 days. 08/08/15  Yes Historical Provider, MD  bimatoprost (LUMIGAN) 0.03 % ophthalmic solution Place 1 drop into both eyes at bedtime.   Yes Historical Provider, MD  budesonide-formoterol (SYMBICORT) 80-4.5 MCG/ACT inhaler Take 2 puffs first thing in am and then another 2 puffs about 12 hours later. 01/03/15  Yes Tanda Rockers, MD  fluticasone (FLONASE) 50 MCG/ACT nasal spray Place 1 spray into both  nostrils daily as needed for allergies.  07/13/15  Yes Historical Provider, MD  glimepiride (AMARYL) 4 MG tablet Take 4 mg by mouth daily with breakfast.   Yes Historical Provider, MD  losartan-hydrochlorothiazide (HYZAAR) 100-25 MG tablet Take 1 tablet by mouth daily.  07/13/15  Yes Historical Provider, MD  MELATONIN PO Take 1 tablet by mouth at bedtime as needed (sleep).    Yes Historical Provider, MD  metFORMIN (GLUCOPHAGE) 1000 MG tablet Take 1,000 mg by mouth 2 (two) times daily with a meal.   Yes Historical Provider, MD  omeprazole (PRILOSEC) 40 MG capsule Take 40 mg by mouth daily.  07/13/15  Yes Historical Provider, MD  oxyCODONE-acetaminophen (PERCOCET/ROXICET) 5-325 MG tablet Take 1 tablet by mouth every 6 (six) hours as needed for severe pain.   Yes Historical Provider, MD  albuterol (PROVENTIL HFA;VENTOLIN HFA) 108 (90 BASE) MCG/ACT inhaler Inhale 2 puffs into the lungs every 6 (six) hours as needed for wheezing or shortness of breath. 02/20/15   Billy Fischer, MD  albuterol (PROVENTIL) (2.5 MG/3ML) 0.083% nebulizer solution Take 2.5 mg by nebulization every 6 (six)  hours as needed.  07/13/15   Historical Provider, MD  benzonatate (TESSALON) 200 MG capsule One four times daily as needed for cough Patient not taking: Reported on 08/13/2015 01/03/15   Tanda Rockers, MD  HYDROcodone-acetaminophen (NORCO) 5-325 MG tablet Take 1 tablet by mouth every 4 (four) hours as needed for moderate pain. Patient not taking: Reported on 08/13/2015 06/21/15   Johnathan Hausen, MD   Physical Exam: Filed Vitals:   08/13/15 1413 08/13/15 1947 08/13/15 2221  BP: 142/95 145/85 126/89  Pulse: 64 65 85  Temp: 98.2 F (36.8 C)  98.4 F (36.9 C)  TempSrc: Oral  Oral  Resp: 18 16 18   SpO2: 100% 99% 100%    Wt Readings from Last 3 Encounters:  06/21/15 96.163 kg (212 lb)  01/17/15 101.152 kg (223 lb)  01/03/15 101.243 kg (223 lb 3.2 oz)    General:  Appears calm and comfortable Eyes: PERRL, normal lids,  irises & conjunctiva ENT: grossly normal hearing, lips & tongue, oral mucosa is mildly dry. Neck: no LAD, masses or thyromegaly Cardiovascular: RRR, no m/r/g. No LE edema. Telemetry: SR, no arrhythmias  Respiratory: CTA bilaterally, no w/r/r. Normal respiratory effort. Abdomen: + Surgical scar, BS+, soft, mild diffuse tenderness without guarding or rebound.  Skin: no rash or induration seen on limited exam Musculoskeletal: grossly normal tone BUE/BLE Psychiatric: grossly normal mood and affect, speech fluent and appropriate Neurologic: Awake, alert, oriented 3, grossly non-focal.          Labs on Admission:  Basic Metabolic Panel:  Recent Labs Lab 08/13/15 1456  NA 137  K 3.6  CL 99*  CO2 29  GLUCOSE 128*  BUN 15  CREATININE 0.82  CALCIUM 9.9   Liver Function Tests:  Recent Labs Lab 08/13/15 1456  AST 13*  ALT 14  ALKPHOS 79  BILITOT 0.6  PROT 7.4  ALBUMIN 4.1    Recent Labs Lab 08/13/15 1456  LIPASE 58*   CBC:  Recent Labs Lab 08/13/15 1456  WBC 9.1  HGB 13.0  HCT 39.4  MCV 87.4  PLT 358    Radiological Exams on Admission: Ct Abdomen Pelvis W Contrast  08/13/2015  CLINICAL DATA:  Five day history of diffuse abdominal pain with constipation and nausea. EXAM: CT ABDOMEN AND PELVIS WITH CONTRAST TECHNIQUE: Multidetector CT imaging of the abdomen and pelvis was performed using the standard protocol following bolus administration of intravenous contrast. CONTRAST:  158mL OMNIPAQUE IOHEXOL 300 MG/ML  SOLN COMPARISON:  07/04/2014 FINDINGS: Lower chest:  Unremarkable. Hepatobiliary: Small area of low attenuation in the anterior liver, adjacent to the falciform ligament, is in a characteristic location for focal fatty change. No focal abnormality within the liver parenchyma. There is no evidence for gallstones, gallbladder wall thickening, or pericholecystic fluid. No intrahepatic or extrahepatic biliary dilation. Pancreas: No focal mass lesion. No dilatation of  the main duct. No intraparenchymal cyst. There is very subtle edema seen around the pancreatic head/ uncinate process and second/ third portion of the duodenum. Spleen: No splenomegaly. No focal mass lesion. Adrenals/Urinary Tract: No adrenal nodule or mass. Kidneys are normal in appearance bilaterally. No evidence for hydroureter. The urinary bladder appears normal for the degree of distention. Stomach/Bowel: Stomach is nondistended. No gastric wall thickening. No evidence of outlet obstruction. Duodenum is normally positioned as is the ligament of Treitz. No small bowel wall thickening. No small bowel dilatation. The terminal ileum is normal. The appendix is normal. No gross colonic mass. No colonic wall thickening. No substantial  diverticular change. Vascular/Lymphatic: There is abdominal aortic atherosclerosis without aneurysm. There is no gastrohepatic or hepatoduodenal ligament lymphadenopathy. No intraperitoneal or retroperitoneal lymphadenopathy. No pelvic sidewall lymphadenopathy. Reproductive: Uterus is surgically absent. There is no adnexal mass. Other: No intraperitoneal free fluid. Musculoskeletal: Pelvic floor laxity is evident. Evidence of prior ventral mesh placement in the abdomen. Patient is status post right hip replacement. Bone windows reveal no worrisome lytic or sclerotic osseous lesions. IMPRESSION: 1. Very subtle stranding around the inferior pancreatic head/uncinate process and along the medial wall of the distal descending duodenum suggests edema. This is a very subtle finding and was not definitely seen previously, but raises the question of focal pancreatitis or duodenitis. 2. Otherwise unremarkable study. Electronically Signed   By: Misty Stanley M.D.   On: 08/13/2015 21:40     Assessment/Plan Principal Problem:   Acute pancreatitis   Abdominal pain Admit to MedSurg. Keep nothing by mouth. Analgesics and antiemetic as needed. Protonix 40 mg IV P every 24 hours. Monitor CBC,  electrolytes and lipase level. Check fasting lipid panel in a.m.  Active Problems:   UTI (lower urinary tract infection) Start Rocephin 1 g IVPB every 24 hours. Follow urine culture and sensitivity.    Diabetes mellitus (HCC) CBG monitoring every 6 hours while nothing by mouth. Switch to CBG monitoring 3 times a day before meals once tolerating by mouth and resume oral medications.    Essential hypertension, benign Resume amlodipine, losartan once tolerating oral intake. Hydrochlorothiazide may need to be held due to pancreatitis. Monitor blood pressure.       Code Status: Full code. DVT Prophylaxis: SCDs. Family Communication:  Disposition Plan: Admit for IV hydration and symptoms treatment.  Time spent: Over 70 minutes were spent in the process of this admission.  Reubin Milan, M.D. [facility] Pager [phone].

## 2015-08-13 NOTE — ED Provider Notes (Signed)
CSN: [id number]     Arrival date & time 08/13/15  1345 History   First MD Initiated Contact with Patient 08/13/15 1953     Chief Complaint  Patient presents with  . Abdominal Pain     (Consider location/radiation/quality/duration/timing/severity/associated sxs/prior Treatment) HPI Complains of diffuse abdominal pain gradual onset 4 days ago, pain radiates to the back. She is treated self with Percocet with improvement. Nothing makes symptoms worse. She denies anorexia. States she is presently hungry. Had bowel movement this morning which was small. No blood per rectum. Denies urinary symptoms. No other associated symptoms. She had similar pain in the past prior to a hernia repair. She went to her PCP at [facility] this morning who advised her to come here for further evaluation. Past Medical History  Diagnosis Date  . Hypertension   . Diabetes mellitus without complication ([place])   . Anemia 2006    required transfusion post TAH/BSO 05/2005  . GLA deficiency ([place])   . Glaucoma of both eyes   . Shortness of breath dyspnea   . Arthritis   . Chronic cough   . GERD (gastroesophageal reflux disease)     on prilosec   Past Surgical History  Procedure Laterality Date  . Total abdominal hysterectomy w/ bilateral salpingoophorectomy  05/2005  . Hysteroscopy w/d&c  01/2005    for uterine fibroids.   . Esophagogastroduodenoscopy N/A 08/22/2013    Procedure: ESOPHAGOGASTRODUODENOSCOPY (EGD);  Surgeon: Irene Shipper, MD;  Location: [facility] ENDOSCOPY;  Service: Endoscopy;  Laterality: N/A;  . Colonoscopy N/A 08/22/2013    Procedure: COLONOSCOPY;  Surgeon: Irene Shipper, MD;  Location: [place];  Service: Endoscopy;  Laterality: N/A;  . Ventral hernia repair N/A 08/24/2013    Procedure: HERNIA REPAIR VENTRAL ADULT;  Surgeon: Edward Jolly, MD;  Location: [place];  Service: General;  Laterality: N/A;  . Insertion of mesh N/A 08/24/2013    Procedure: INSERTION OF MESH;  Surgeon: Edward Jolly,  MD;  Location: [place];  Service: General;  Laterality: N/A;  . Panniculectomy N/A 08/24/2013    Procedure: PANNICULECTOMY;  Surgeon: Edward Jolly, MD;  Location: [place];  Service: General;  Laterality: N/A;  . Total hip arthroplasty Right 12/12/2013    Procedure: RIGHT TOTAL HIP ARTHROPLASTY ANTERIOR APPROACH;  Surgeon: Mcarthur Rossetti, MD;  Location: [place];  Service: Orthopedics;  Laterality: Right;  . Abdominal hysterectomy    . Hernia repair    . Joint replacement Right   . Lipoma excision Left 06/21/2015    Procedure: EXCISION OF LEFT SCALP LIPOMA;  Surgeon: Johnathan Hausen, MD;  Location: [place];  Service: General;  Laterality: Left;   Family History  Problem Relation Age of Onset  . Emphysema Mother     smoked  . Colon cancer Father    Social History  Substance Use Topics  . Smoking status: Former Smoker -- 0.25 packs/day for 15 years    Types: Cigarettes    Quit date: 06/22/1978  . Smokeless tobacco: Never Used  . Alcohol Use: No   OB History    No data available     Review of Systems  Constitutional: Negative.   HENT: Negative.   Respiratory: Negative.   Cardiovascular: Negative.   Gastrointestinal: Positive for abdominal pain.  Musculoskeletal: Negative.   Skin: Negative.   Neurological: Negative.   Psychiatric/Behavioral: Negative.   All other systems reviewed and are negative.     Allergies  Diclofenac and Penicillins  Home  Medications   Prior to Admission medications   Medication Sig Start Date End Date Taking? Authorizing Provider  amLODipine (NORVASC) 2.5 MG tablet Take 2.5 mg by mouth daily.   Yes Historical Provider, MD  aspirin 81 MG tablet Take 81 mg by mouth daily.   Yes Historical Provider, MD  azithromycin (ZITHROMAX) 250 MG tablet Take 250-500 mg by mouth daily. ABT Start Date 08/10/15 & End Date 08/14/15. Med Instructions:Take 2 tablets on Day 1 then Take 1 tablet daily for 4 days. 08/08/15  Yes Historical Provider,  MD  bimatoprost (LUMIGAN) 0.03 % ophthalmic solution Place 1 drop into both eyes at bedtime.   Yes Historical Provider, MD  budesonide-formoterol (SYMBICORT) 80-4.5 MCG/ACT inhaler Take 2 puffs first thing in am and then another 2 puffs about 12 hours later. 01/03/15  Yes Tanda Rockers, MD  fluticasone (FLONASE) 50 MCG/ACT nasal spray Place 1 spray into both nostrils daily as needed for allergies.  07/13/15  Yes Historical Provider, MD  glimepiride (AMARYL) 4 MG tablet Take 4 mg by mouth daily with breakfast.   Yes Historical Provider, MD  losartan-hydrochlorothiazide (HYZAAR) 100-25 MG tablet Take 1 tablet by mouth daily.  07/13/15  Yes Historical Provider, MD  MELATONIN PO Take 1 tablet by mouth at bedtime as needed (sleep).    Yes Historical Provider, MD  metFORMIN (GLUCOPHAGE) 1000 MG tablet Take 1,000 mg by mouth 2 (two) times daily with a meal.   Yes Historical Provider, MD  omeprazole (PRILOSEC) 40 MG capsule Take 40 mg by mouth daily.  07/13/15  Yes Historical Provider, MD  oxyCODONE-acetaminophen (PERCOCET/ROXICET) 5-325 MG tablet Take 1 tablet by mouth every 6 (six) hours as needed for severe pain.   Yes Historical Provider, MD  albuterol (PROVENTIL HFA;VENTOLIN HFA) 108 (90 BASE) MCG/ACT inhaler Inhale 2 puffs into the lungs every 6 (six) hours as needed for wheezing or shortness of breath. 02/20/15   Billy Fischer, MD  albuterol (PROVENTIL) (2.5 MG/3ML) 0.083% nebulizer solution Take 2.5 mg by nebulization every 6 (six) hours as needed.  07/13/15   Historical Provider, MD  benzonatate (TESSALON) 200 MG capsule One four times daily as needed for cough Patient not taking: Reported on 08/13/2015 01/03/15   Tanda Rockers, MD  HYDROcodone-acetaminophen (NORCO) 5-325 MG tablet Take 1 tablet by mouth every 4 (four) hours as needed for moderate pain. Patient not taking: Reported on 08/13/2015 06/21/15   Johnathan Hausen, MD   BP 145/85 mmHg  Pulse 65  Temp(Src) 98.2 F (36.8 C) (Oral)  Resp 16  SpO2  99% Physical Exam  Constitutional: She appears well-developed and well-nourished.  HENT:  Head: Normocephalic and atraumatic.  Eyes: Conjunctivae are normal. Pupils are equal, round, and reactive to light.  Neck: Neck supple. No tracheal deviation present. No thyromegaly present.  Cardiovascular: Normal rate and regular rhythm.   No murmur heard. Pulmonary/Chest: Effort normal and breath sounds normal.  Abdominal: Soft. Bowel sounds are normal. She exhibits no distension and no mass. There is tenderness. There is no rebound and no guarding.  Diffusely tender  Musculoskeletal: Normal range of motion. She exhibits no edema or tenderness.  Neurological: She is alert. Coordination normal.  Skin: Skin is warm and dry. No rash noted.  Psychiatric: She has a normal mood and affect.  Nursing note and vitals reviewed.   ED Course  Procedures (including critical care time) Labs Review Labs Reviewed  LIPASE, BLOOD - Abnormal; Notable for the following:    Lipase 58 (*)  All other components within normal limits  COMPREHENSIVE METABOLIC PANEL - Abnormal; Notable for the following:    Chloride 99 (*)    Glucose, Bld 128 (*)    AST 13 (*)    All other components within normal limits  CBC  URINALYSIS, ROUTINE W REFLEX MICROSCOPIC (NOT AT [facility])    Imaging Review No results found. I have personally reviewed and evaluated these images and lab results as part of my medical decision-making.   EKG Interpretation None     2 2:10 PM continues to complain of abdominal pain and nausea. Intravenous hydromorphone and intravenous Reglan ordered. Intravenous normal saline drip ordered. Results for orders placed or performed during the hospital encounter of 08/13/15  Lipase, blood  Result Value Ref Range   Lipase 58 (H) 11 - 51 U/L  Comprehensive metabolic panel  Result Value Ref Range   Sodium 137 135 - 145 mmol/L   Potassium 3.6 3.5 - 5.1 mmol/L   Chloride 99 (L) 101 - 111 mmol/L   CO2 29  22 - 32 mmol/L   Glucose, Bld 128 (H) 65 - 99 mg/dL   BUN 15 6 - 20 mg/dL   Creatinine, Ser 0.82 0.44 - 1.00 mg/dL   Calcium 9.9 8.9 - 10.3 mg/dL   Total Protein 7.4 6.5 - 8.1 g/dL   Albumin 4.1 3.5 - 5.0 g/dL   AST 13 (L) 15 - 41 U/L   ALT 14 14 - 54 U/L   Alkaline Phosphatase 79 38 - 126 U/L   Total Bilirubin 0.6 0.3 - 1.2 mg/dL   GFR calc non Af Amer >60 >60 mL/min   GFR calc Af Amer >60 >60 mL/min   Anion gap 9 5 - 15  CBC  Result Value Ref Range   WBC 9.1 4.0 - 10.5 K/uL   RBC 4.51 3.87 - 5.11 MIL/uL   Hemoglobin 13.0 12.0 - 15.0 g/dL   HCT 39.4 36.0 - 46.0 %   MCV 87.4 78.0 - 100.0 fL   MCH 28.8 26.0 - 34.0 pg   MCHC 33.0 30.0 - 36.0 g/dL   RDW 13.7 11.5 - 15.5 %   Platelets 358 150 - 400 K/uL  Urinalysis, Routine w reflex microscopic (not at [facility])  Result Value Ref Range   Color, Urine YELLOW YELLOW   APPearance CLOUDY (A) CLEAR   Specific Gravity, Urine 1.028 1.005 - 1.030   pH 5.5 5.0 - 8.0   Glucose, UA NEGATIVE NEGATIVE mg/dL   Hgb urine dipstick TRACE (A) NEGATIVE   Bilirubin Urine NEGATIVE NEGATIVE   Ketones, ur NEGATIVE NEGATIVE mg/dL   Protein, ur NEGATIVE NEGATIVE mg/dL   Nitrite POSITIVE (A) NEGATIVE   Leukocytes, UA SMALL (A) NEGATIVE  Urine microscopic-add on  Result Value Ref Range   Squamous Epithelial / LPF 0-5 (A) NONE SEEN   WBC, UA TOO NUMEROUS TO COUNT 0 - 5 WBC/hpf   RBC / HPF 6-30 0 - 5 RBC/hpf   Bacteria, UA MANY (A) NONE SEEN   Ct Abdomen Pelvis W Contrast  08/13/2015  CLINICAL DATA:  Five day history of diffuse abdominal pain with constipation and nausea. EXAM: CT ABDOMEN AND PELVIS WITH CONTRAST TECHNIQUE: Multidetector CT imaging of the abdomen and pelvis was performed using the standard protocol following bolus administration of intravenous contrast. CONTRAST:  14mL OMNIPAQUE IOHEXOL 300 MG/ML  SOLN COMPARISON:  07/04/2014 FINDINGS: Lower chest:  Unremarkable. Hepatobiliary: Small area of low attenuation in the anterior liver, adjacent  to the falciform ligament,  is in a characteristic location for focal fatty change. No focal abnormality within the liver parenchyma. There is no evidence for gallstones, gallbladder wall thickening, or pericholecystic fluid. No intrahepatic or extrahepatic biliary dilation. Pancreas: No focal mass lesion. No dilatation of the main duct. No intraparenchymal cyst. There is very subtle edema seen around the pancreatic head/ uncinate process and second/ third portion of the duodenum. Spleen: No splenomegaly. No focal mass lesion. Adrenals/Urinary Tract: No adrenal nodule or mass. Kidneys are normal in appearance bilaterally. No evidence for hydroureter. The urinary bladder appears normal for the degree of distention. Stomach/Bowel: Stomach is nondistended. No gastric wall thickening. No evidence of outlet obstruction. Duodenum is normally positioned as is the ligament of Treitz. No small bowel wall thickening. No small bowel dilatation. The terminal ileum is normal. The appendix is normal. No gross colonic mass. No colonic wall thickening. No substantial diverticular change. Vascular/Lymphatic: There is abdominal aortic atherosclerosis without aneurysm. There is no gastrohepatic or hepatoduodenal ligament lymphadenopathy. No intraperitoneal or retroperitoneal lymphadenopathy. No pelvic sidewall lymphadenopathy. Reproductive: Uterus is surgically absent. There is no adnexal mass. Other: No intraperitoneal free fluid. Musculoskeletal: Pelvic floor laxity is evident. Evidence of prior ventral mesh placement in the abdomen. Patient is status post right hip replacement. Bone windows reveal no worrisome lytic or sclerotic osseous lesions. IMPRESSION: 1. Very subtle stranding around the inferior pancreatic head/uncinate process and along the medial wall of the distal descending duodenum suggests edema. This is a very subtle finding and was not definitely seen previously, but raises the question of focal pancreatitis or  duodenitis. 2. Otherwise unremarkable study. Electronically Signed   By: Misty Stanley M.D.   On: 08/13/2015 21:40    MDM  Patient has urinary tract infection. Urine sent for culture. Plan nothing by mouth. IV fluids, analgesics and antiemetics, symptomatic control. IV antibiotics. Consulted with Dr. Olevia Bowens who will arrange for overnight stay Diagnoses #1 acute pancreatitis #2 urinary tract infection Final diagnoses:  None        Orlie Dakin, MD 08/13/15 2223

## 2015-08-13 NOTE — ED Notes (Signed)
Per pt, states abdominal pain radiating to back-went to PCP and they told her to come here

## 2015-08-13 NOTE — ED Notes (Signed)
Patient is not going to the assigned [facility] bed. Informed patient that she will be waiting for bed at [facility].

## 2015-08-13 NOTE — ED Notes (Signed)
Attempted to call primary nurse for 5W10C. Pt is in another patients room and will call back.

## 2015-08-14 DIAGNOSIS — R1033 Periumbilical pain: Secondary | ICD-10-CM

## 2015-08-14 DIAGNOSIS — K851 Biliary acute pancreatitis without necrosis or infection: Secondary | ICD-10-CM

## 2015-08-14 DIAGNOSIS — E119 Type 2 diabetes mellitus without complications: Secondary | ICD-10-CM

## 2015-08-14 DIAGNOSIS — N390 Urinary tract infection, site not specified: Secondary | ICD-10-CM

## 2015-08-14 LAB — GLUCOSE, CAPILLARY
Glucose-Capillary: 120 mg/dL — ABNORMAL HIGH (ref 65–99)
Glucose-Capillary: 89 mg/dL (ref 65–99)
Glucose-Capillary: 82 mg/dL (ref 65–99)

## 2015-08-14 LAB — MAGNESIUM: Magnesium: 1.7 mg/dL (ref 1.7–2.4)

## 2015-08-14 LAB — CBC WITH DIFFERENTIAL/PLATELET
Basophils Absolute: 0 10*3/uL (ref 0.0–0.1)
Basophils Relative: 0 %
Eosinophils Absolute: 0.1 10*3/uL (ref 0.0–0.7)
Eosinophils Relative: 1 %
HCT: 36.5 % (ref 36.0–46.0)
Hemoglobin: 11.9 g/dL — ABNORMAL LOW (ref 12.0–15.0)
Lymphocytes Relative: 26 %
Lymphs Abs: 2.3 10*3/uL (ref 0.7–4.0)
MCH: 29.1 pg (ref 26.0–34.0)
MCHC: 32.6 g/dL (ref 30.0–36.0)
MCV: 89.2 fL (ref 78.0–100.0)
Monocytes Absolute: 0.6 10*3/uL (ref 0.1–1.0)
Monocytes Relative: 6 %
Neutro Abs: 6 10*3/uL (ref 1.7–7.7)
Neutrophils Relative %: 67 %
Platelets: 318 10*3/uL (ref 150–400)
RBC: 4.09 MIL/uL (ref 3.87–5.11)
RDW: 14.1 % (ref 11.5–15.5)
WBC: 9 10*3/uL (ref 4.0–10.5)

## 2015-08-14 LAB — LIPID PANEL
Cholesterol: 120 mg/dL (ref 0–200)
HDL: 43 mg/dL (ref >40)
LDL Cholesterol: 64 mg/dL (ref 0–99)
Total CHOL/HDL Ratio: 2.8 RATIO
Triglycerides: 64 mg/dL (ref <150)
VLDL: 13 mg/dL (ref 0–40)

## 2015-08-14 LAB — COMPREHENSIVE METABOLIC PANEL
ALT: 14 U/L (ref 14–54)
AST: 12 U/L — ABNORMAL LOW (ref 15–41)
Albumin: 3.5 g/dL (ref 3.5–5.0)
Alkaline Phosphatase: 68 U/L (ref 38–126)
Anion gap: 11 (ref 5–15)
BUN: 14 mg/dL (ref 6–20)
CO2: 25 mmol/L (ref 22–32)
Calcium: 8.8 mg/dL — ABNORMAL LOW (ref 8.9–10.3)
Chloride: 102 mmol/L (ref 101–111)
Creatinine, Ser: 0.86 mg/dL (ref 0.44–1.00)
GFR calc Af Amer: >60 mL/min (ref >60)
GFR calc non Af Amer: >60 mL/min (ref >60)
Glucose, Bld: 126 mg/dL — ABNORMAL HIGH (ref 65–99)
Potassium: 3.3 mmol/L — ABNORMAL LOW (ref 3.5–5.1)
Sodium: 138 mmol/L (ref 135–145)
Total Bilirubin: 0.7 mg/dL (ref 0.3–1.2)
Total Protein: 6.5 g/dL (ref 6.5–8.1)

## 2015-08-14 LAB — LIPASE, BLOOD: Lipase: 61 U/L — ABNORMAL HIGH (ref 11–51)

## 2015-08-14 LAB — PHOSPHORUS: Phosphorus: 3.8 mg/dL (ref 2.5–4.6)

## 2015-08-14 MED ORDER — BUDESONIDE-FORMOTEROL FUMARATE 80-4.5 MCG/ACT IN AERO
2.0000 | INHALATION_SPRAY | Freq: Two times a day (BID) | RESPIRATORY_TRACT | Status: DC
  Administered 2015-08-14 – 2015-08-21 (×12): 2 via RESPIRATORY_TRACT
  Filled 2015-08-14: qty 6.9

## 2015-08-14 MED ORDER — PANTOPRAZOLE SODIUM 40 MG IV SOLR
40.0000 mg | Freq: Two times a day (BID) | INTRAVENOUS | Status: DC
  Administered 2015-08-14 – 2015-08-20 (×12): 40 mg via INTRAVENOUS
  Filled 2015-08-14 (×13): qty 40

## 2015-08-14 MED ORDER — POTASSIUM CHLORIDE IN NACL 20-0.9 MEQ/L-% IV SOLN
INTRAVENOUS | Status: DC
  Administered 2015-08-14 (×2): via INTRAVENOUS
  Administered 2015-08-14: 1000 mL via INTRAVENOUS
  Administered 2015-08-15: 17:00:00 via INTRAVENOUS
  Administered 2015-08-16: 1000 mL via INTRAVENOUS
  Administered 2015-08-17 – 2015-08-19 (×4): via INTRAVENOUS
  Administered 2015-08-20: 1000 mL via INTRAVENOUS
  Filled 2015-08-14 (×17): qty 1000

## 2015-08-14 MED ORDER — INSULIN ASPART 100 UNIT/ML ~~LOC~~ SOLN
0.0000 [IU] | Freq: Three times a day (TID) | SUBCUTANEOUS | Status: DC
  Administered 2015-08-20: 1 [IU] via SUBCUTANEOUS
  Administered 2015-08-20: 2 [IU] via SUBCUTANEOUS
  Administered 2015-08-20: 1 [IU] via SUBCUTANEOUS
  Administered 2015-08-21: 2 [IU] via SUBCUTANEOUS

## 2015-08-14 MED ORDER — LATANOPROST 0.005 % OP SOLN
1.0000 [drp] | Freq: Every day | OPHTHALMIC | Status: DC
  Administered 2015-08-14 – 2015-08-20 (×7): 1 [drp] via OPHTHALMIC
  Filled 2015-08-14: qty 2.5

## 2015-08-14 MED ORDER — BISACODYL 10 MG RE SUPP
10.0000 mg | Freq: Once | RECTAL | Status: AC
  Administered 2015-08-14: 10 mg via RECTAL
  Filled 2015-08-14: qty 1

## 2015-08-14 MED ORDER — ONDANSETRON HCL 4 MG/2ML IJ SOLN
4.0000 mg | Freq: Four times a day (QID) | INTRAMUSCULAR | Status: DC | PRN
  Administered 2015-08-14 – 2015-08-20 (×12): 4 mg via INTRAVENOUS
  Filled 2015-08-14 (×12): qty 2

## 2015-08-14 MED ORDER — CEFTRIAXONE SODIUM 1 G IJ SOLR
1.0000 g | INTRAMUSCULAR | Status: DC
Start: 2015-08-14 — End: 2015-08-16
  Administered 2015-08-14 – 2015-08-15 (×2): 1 g via INTRAVENOUS
  Filled 2015-08-14 (×3): qty 10

## 2015-08-14 MED ORDER — POTASSIUM CHLORIDE 10 MEQ/100ML IV SOLN
10.0000 meq | INTRAVENOUS | Status: AC
  Administered 2015-08-14 (×2): 10 meq via INTRAVENOUS
  Filled 2015-08-14 (×2): qty 100

## 2015-08-14 MED ORDER — HYDROMORPHONE HCL 1 MG/ML IJ SOLN
1.0000 mg | INTRAMUSCULAR | Status: DC | PRN
  Administered 2015-08-14 – 2015-08-15 (×11): 1 mg via INTRAVENOUS
  Filled 2015-08-14 (×11): qty 1

## 2015-08-14 MED ORDER — ONDANSETRON HCL 4 MG PO TABS: 4.0000 mg | ORAL_TABLET | Freq: Four times a day (QID) | ORAL | Status: DC | PRN

## 2015-08-14 MED ORDER — ENOXAPARIN SODIUM 40 MG/0.4ML ~~LOC~~ SOLN
40.0000 mg | SUBCUTANEOUS | Status: DC
Start: 2015-08-14 — End: 2015-08-21
  Administered 2015-08-14 – 2015-08-20 (×7): 40 mg via SUBCUTANEOUS
  Filled 2015-08-14 (×7): qty 0.4

## 2015-08-14 NOTE — Progress Notes (Signed)
ANTICOAGULATION CONSULT NOTE - Initial Consult  Pharmacy Consult for enoxaparin Indication: VTE prophylaxis  Allergies  Allergen Reactions  . Diclofenac     Hives   . Penicillins Hives    Has patient had a PCN reaction causing immediate rash, facial/tongue/throat swelling, SOB or lightheadedness with hypotension: Yes Has patient had a PCN reaction causing severe rash involving mucus membranes or skin necrosis: Yes Has patient had a PCN reaction that required hospitalization No Has patient had a PCN reaction occurring within the last 10 years: No. If all of the above answers are "NO", then may proceed with Cephalosporin use.     Patient Measurements: Height: 5\' 4"  (162.6 cm) Weight: 194 lb 3.6 oz (88.1 kg) IBW/kg (Calculated) : 54.7 Heparin Dosing Weight:   Vital Signs: Temp: 97.4 F (36.3 C) (02/22 1355) Temp Source: Oral (02/22 1355) BP: 105/57 mmHg (02/22 1355) Pulse Rate: 66 (02/22 1355)  Labs:  Recent Labs  08/13/15 1456 08/14/15 0417  HGB 13.0 11.9*  HCT 39.4 36.5  PLT 358 318  CREATININE 0.82 0.86    Estimated Creatinine Clearance: 76.7 mL/min (by C-G formula based on Cr of 0.86).   Medical History: Past Medical History  Diagnosis Date  . Hypertension   . Diabetes mellitus without complication ([place])   . Anemia 2006    required transfusion post TAH/BSO 05/2005  . GLA deficiency ([place])   . Glaucoma of both eyes   . Shortness of breath dyspnea   . Arthritis   . Chronic cough   . GERD (gastroesophageal reflux disease)     on prilosec    Assessment: 55 YOF presents with abdominal pain d/t acute pancreatitis.  Pharmacy asked to dose enoxaparin for VTE prophylaxis.  Today's labs: 08/14/2015   CrCl > 49ml/min  CBC: Hgb 11.9, pltc WNL  Goal of Therapy:  Anti-Xa level 0.3-0.6 units/ml 4hrs after LMWH dose given  Plan:   ENoxaparin 40mg  SQ q24h  Weekly SCr  Pharmacy to sign off and adjust dose if CrCl falls < 71ml/min  [name] 08/14/2015,3:32 PM

## 2015-08-14 NOTE — ED Notes (Signed)
Provided ice chips.  

## 2015-08-14 NOTE — ED Notes (Signed)
Spoke with Vanita Ingles, RN (primary nurse for [phone]) for report. She is unable to take report. Provided call back number and she will call back when available.

## 2015-08-14 NOTE — Progress Notes (Addendum)
[facility] Progress Note   [name]  [id number]  DOB: [birth year]  DOA: 08/13/2015 PCP: Gara Kroner, MD  Brief narrative: LEGEND [name] is a 60 y.o. female with hypertension, type 2 diabetes mellitus, GLA deficiency presents to the ER for mid abdominal pain associated with nausea for 4-5 days. She has not had any vomiting, fevers or diarrhea. Pain feels like an ache and is nearly constant. She also complains of mid back pain which started after her abdominal pain. She was noted to have mildly elevated lipase and a CT suggestive of possible pancreatitis and duodenitis and was therefore admitted.   Subjective: Continues to have mid abdominal aching. Mild nausea but still no vomiting and no diarrhea. No fever or chills. No complaint of cough sore throat or shortness of breath.  Assessment/Plan: Principal Problem:   Abdominal pain suspected to be secondary to Acute pancreatitis and possible duodenitis -Continue nothing by mouth along with pain control and IV fluids - She was on HCTZ which is now on hold -Lipid panel does not reveal hypertriglyceridemia -CT scan does not reveal cholelithiasis -Have started IV Protonix as well for possible duodenitis  Active Problems:    Diabetes mellitus  -Oral medication on hold and on sliding scale insulin for now  Back pain -On exam, this appears to be musculoskeletal with increase tenderness, pain on palpation  Hypokalemia -Replaced via IV - recheck tomorrow    Hypotension with a history of Essential hypertension -Holding amlodipine, losartan/HCTZ as BP is low    UTI (lower urinary tract infection) -He does admit to having increased frequency of micturition prior to admission-UA is positive-continue Rocephin and follow-up on urine culture    Antibiotics: Anti-infectives    Start     Dose/Rate Route Frequency Ordered Stop   08/14/15 2200  cefTRIAXone (ROCEPHIN) 1 g in dextrose 5 % 50 mL IVPB     1 g 100 mL/hr over  30 Minutes Intravenous Every 24 hours 08/14/15 0213     08/13/15 2215  cefTRIAXone (ROCEPHIN) 1 g in dextrose 5 % 50 mL IVPB     1 g 100 mL/hr over 30 Minutes Intravenous  Once 08/13/15 2212 08/13/15 2257     Code Status:     Code Status Orders        Start     Ordered   08/14/15 0214  Full code   Continuous     08/14/15 0213    Code Status History    Date Active Date Inactive Code Status Order ID Comments User Context   12/12/2013  7:58 PM 12/15/2013  1:07 PM Full Code [id number]  Mcarthur Rossetti, MD Inpatient   08/24/2013  5:03 PM 08/29/2013  4:50 PM Full Code [id number]  Saverio Danker, PA-C Inpatient   08/19/2013  3:06 PM 08/24/2013  5:03 PM Full Code [id number]  Domenic Polite, MD Inpatient     Family Communication:  Disposition Plan: Continue to follow on MedSurg unit-home in 2-3 days hopefully DVT prophylaxis: Lovenox Consultants: None Procedures: None    Objective: Filed Weights   08/14/15 0130  Weight: 88.1 kg (194 lb 3.6 oz)   No intake or output data in the 24 hours ending 08/14/15 1524   Vitals Filed Vitals:   08/14/15 0130 08/14/15 0630 08/14/15 1030 08/14/15 1355  BP: 111/72 115/65  105/57  Pulse: 68 61  66  Temp: 98 F (36.7 C) 98.1 F (36.7 C)  97.4 F (36.3 C)  TempSrc: Oral Oral  Oral  Resp:  18 18  18   Height: 5\' 4"  (1.626 m)     Weight: 88.1 kg (194 lb 3.6 oz)     SpO2: 98% 97% 98% 99%    Exam:  General:  Pt is alert, not in acute distress  HEENT: No icterus, No thrush, oral mucosa moist  Cardiovascular: regular rate and rhythm, S1/S2 No murmur  Respiratory: clear to auscultation bilaterally   Abdomen: Soft, +Bowel sounds, mid abdominal tenderness, no rebound tenderness, non distended, no guarding  Back: Pain in lower thoracic upper lumbar area lateral to the spine (bilaterally)  MSK: No cyanosis or clubbing- no pedal edema   Data Reviewed: Basic Metabolic Panel:  Recent Labs Lab 08/13/15 1456 08/14/15 0417  NA 137 138  K  3.6 3.3*  CL 99* 102  CO2 29 25  GLUCOSE 128* 126*  BUN 15 14  CREATININE 0.82 0.86  CALCIUM 9.9 8.8*  MG  --  1.7  PHOS  --  3.8   Liver Function Tests:  Recent Labs Lab 08/13/15 1456 08/14/15 0417  AST 13* 12*  ALT 14 14  ALKPHOS 79 68  BILITOT 0.6 0.7  PROT 7.4 6.5  ALBUMIN 4.1 3.5    Recent Labs Lab 08/13/15 1456 08/14/15 0417  LIPASE 58* 61*   No results for input(s): AMMONIA in the last 168 hours. CBC:  Recent Labs Lab 08/13/15 1456 08/14/15 0417  WBC 9.1 9.0  NEUTROABS  --  6.0  HGB 13.0 11.9*  HCT 39.4 36.5  MCV 87.4 89.2  PLT 358 318   Cardiac Enzymes: No results for input(s): CKTOTAL, CKMB, CKMBINDEX, TROPONINI in the last 168 hours. BNP (last 3 results) No results for input(s): BNP in the last 8760 hours.  ProBNP (last 3 results) No results for input(s): PROBNP in the last 8760 hours.  CBG:  Recent Labs Lab 08/14/15 0735 08/14/15 1144  GLUCAP 120* 89    No results found for this or any previous visit (from the past 240 hour(s)).   Studies: Ct Abdomen Pelvis W Contrast  08/13/2015  CLINICAL DATA:  Five day history of diffuse abdominal pain with constipation and nausea. EXAM: CT ABDOMEN AND PELVIS WITH CONTRAST TECHNIQUE: Multidetector CT imaging of the abdomen and pelvis was performed using the standard protocol following bolus administration of intravenous contrast. CONTRAST:  171mL OMNIPAQUE IOHEXOL 300 MG/ML  SOLN COMPARISON:  07/04/2014 FINDINGS: Lower chest:  Unremarkable. Hepatobiliary: Small area of low attenuation in the anterior liver, adjacent to the falciform ligament, is in a characteristic location for focal fatty change. No focal abnormality within the liver parenchyma. There is no evidence for gallstones, gallbladder wall thickening, or pericholecystic fluid. No intrahepatic or extrahepatic biliary dilation. Pancreas: No focal mass lesion. No dilatation of the main duct. No intraparenchymal cyst. There is very subtle edema seen  around the pancreatic head/ uncinate process and second/ third portion of the duodenum. Spleen: No splenomegaly. No focal mass lesion. Adrenals/Urinary Tract: No adrenal nodule or mass. Kidneys are normal in appearance bilaterally. No evidence for hydroureter. The urinary bladder appears normal for the degree of distention. Stomach/Bowel: Stomach is nondistended. No gastric wall thickening. No evidence of outlet obstruction. Duodenum is normally positioned as is the ligament of Treitz. No small bowel wall thickening. No small bowel dilatation. The terminal ileum is normal. The appendix is normal. No gross colonic mass. No colonic wall thickening. No substantial diverticular change. Vascular/Lymphatic: There is abdominal aortic atherosclerosis without aneurysm. There is no gastrohepatic or hepatoduodenal ligament lymphadenopathy. No intraperitoneal  or retroperitoneal lymphadenopathy. No pelvic sidewall lymphadenopathy. Reproductive: Uterus is surgically absent. There is no adnexal mass. Other: No intraperitoneal free fluid. Musculoskeletal: Pelvic floor laxity is evident. Evidence of prior ventral mesh placement in the abdomen. Patient is status post right hip replacement. Bone windows reveal no worrisome lytic or sclerotic osseous lesions. IMPRESSION: 1. Very subtle stranding around the inferior pancreatic head/uncinate process and along the medial wall of the distal descending duodenum suggests edema. This is a very subtle finding and was not definitely seen previously, but raises the question of focal pancreatitis or duodenitis. 2. Otherwise unremarkable study. Electronically Signed   By: Misty Stanley M.D.   On: 08/13/2015 21:40    Scheduled Meds:  Scheduled Meds: . budesonide-formoterol  2 puff Inhalation BID  . cefTRIAXone (ROCEPHIN)  IV  1 g Intravenous Q24H  . insulin aspart  0-9 Units Subcutaneous TID WC  . latanoprost  1 drop Both Eyes QHS  . pantoprazole (PROTONIX) IV  40 mg Intravenous Q24H    Continuous Infusions: . 0.9 % NaCl with KCl 20 mEq / L 100 mL/hr at 08/14/15 1221    Time spent on care of this patient: 1 min   [place], MD 08/14/2015, 3:24 PM  LOS: 1 day   [facility] Office  [phone] Pager - Text Page per [email or web address] If 7PM-7AM, please contact night-coverage [email or web address]

## 2015-08-15 ENCOUNTER — Institutional Professional Consult (permissible substitution): Payer: BC Managed Care – PPO | Admitting: Internal Medicine

## 2015-08-15 DIAGNOSIS — I10 Essential (primary) hypertension: Secondary | ICD-10-CM

## 2015-08-15 LAB — CBC WITH DIFFERENTIAL/PLATELET
Basophils Absolute: 0 10*3/uL (ref 0.0–0.1)
Basophils Relative: 0 %
Eosinophils Absolute: 0.1 10*3/uL (ref 0.0–0.7)
Eosinophils Relative: 1 %
HCT: 35.9 % — ABNORMAL LOW (ref 36.0–46.0)
Hemoglobin: 11.6 g/dL — ABNORMAL LOW (ref 12.0–15.0)
Lymphocytes Relative: 24 %
Lymphs Abs: 2.4 10*3/uL (ref 0.7–4.0)
MCH: 28.9 pg (ref 26.0–34.0)
MCHC: 32.3 g/dL (ref 30.0–36.0)
MCV: 89.5 fL (ref 78.0–100.0)
Monocytes Absolute: 0.6 10*3/uL (ref 0.1–1.0)
Monocytes Relative: 6 %
Neutro Abs: 6.8 10*3/uL (ref 1.7–7.7)
Neutrophils Relative %: 69 %
Platelets: 294 10*3/uL (ref 150–400)
RBC: 4.01 MIL/uL (ref 3.87–5.11)
RDW: 14.1 % (ref 11.5–15.5)
WBC: 9.8 10*3/uL (ref 4.0–10.5)

## 2015-08-15 LAB — COMPREHENSIVE METABOLIC PANEL
ALT: 12 U/L — ABNORMAL LOW (ref 14–54)
AST: 13 U/L — ABNORMAL LOW (ref 15–41)
Albumin: 3.6 g/dL (ref 3.5–5.0)
Alkaline Phosphatase: 67 U/L (ref 38–126)
Anion gap: 11 (ref 5–15)
BUN: 11 mg/dL (ref 6–20)
CO2: 23 mmol/L (ref 22–32)
Calcium: 8.8 mg/dL — ABNORMAL LOW (ref 8.9–10.3)
Chloride: 107 mmol/L (ref 101–111)
Creatinine, Ser: 0.85 mg/dL (ref 0.44–1.00)
GFR calc Af Amer: >60 mL/min (ref >60)
GFR calc non Af Amer: >60 mL/min (ref >60)
Glucose, Bld: 91 mg/dL (ref 65–99)
Potassium: 3.9 mmol/L (ref 3.5–5.1)
Sodium: 141 mmol/L (ref 135–145)
Total Bilirubin: 0.5 mg/dL (ref 0.3–1.2)
Total Protein: 6.7 g/dL (ref 6.5–8.1)

## 2015-08-15 LAB — GLUCOSE, CAPILLARY
Glucose-Capillary: 130 mg/dL — ABNORMAL HIGH (ref 65–99)
Glucose-Capillary: 69 mg/dL (ref 65–99)
Glucose-Capillary: 75 mg/dL (ref 65–99)
Glucose-Capillary: 79 mg/dL (ref 65–99)
Glucose-Capillary: 84 mg/dL (ref 65–99)
Glucose-Capillary: 87 mg/dL (ref 65–99)
Glucose-Capillary: 96 mg/dL (ref 65–99)

## 2015-08-15 LAB — LIPASE, BLOOD: Lipase: 110 U/L — ABNORMAL HIGH (ref 11–51)

## 2015-08-15 MED ORDER — DEXTROSE 50 % IV SOLN
INTRAVENOUS | Status: AC
  Administered 2015-08-15: 25 mL via INTRAVENOUS
  Filled 2015-08-15: qty 50

## 2015-08-15 MED ORDER — HYDROMORPHONE HCL 1 MG/ML IJ SOLN
1.0000 mg | INTRAMUSCULAR | Status: DC | PRN
  Administered 2015-08-15 – 2015-08-16 (×8): 2 mg via INTRAVENOUS
  Administered 2015-08-16: 1 mg via INTRAVENOUS
  Administered 2015-08-16 – 2015-08-17 (×3): 2 mg via INTRAVENOUS
  Administered 2015-08-17: 1 mg via INTRAVENOUS
  Administered 2015-08-17 – 2015-08-18 (×12): 2 mg via INTRAVENOUS
  Administered 2015-08-19: 1 mg via INTRAVENOUS
  Administered 2015-08-19: 2 mg via INTRAVENOUS
  Administered 2015-08-19: 1 mg via INTRAVENOUS
  Administered 2015-08-19 (×2): 2 mg via INTRAVENOUS
  Administered 2015-08-20 (×2): 1 mg via INTRAVENOUS
  Filled 2015-08-15: qty 2
  Filled 2015-08-15: qty 1
  Filled 2015-08-15 (×7): qty 2
  Filled 2015-08-15: qty 1
  Filled 2015-08-15 (×2): qty 2
  Filled 2015-08-15: qty 1
  Filled 2015-08-15 (×2): qty 2
  Filled 2015-08-15: qty 1
  Filled 2015-08-15 (×5): qty 2
  Filled 2015-08-15: qty 1
  Filled 2015-08-15: qty 2
  Filled 2015-08-15: qty 1
  Filled 2015-08-15 (×2): qty 2
  Filled 2015-08-15: qty 1
  Filled 2015-08-15 (×6): qty 2

## 2015-08-15 MED ORDER — SENNA 8.6 MG PO TABS
2.0000 | ORAL_TABLET | Freq: Every day | ORAL | Status: DC
  Administered 2015-08-15 – 2015-08-19 (×5): 17.2 mg via ORAL
  Filled 2015-08-15 (×6): qty 2

## 2015-08-15 NOTE — Progress Notes (Signed)
Nutrition Brief Note  Patient identified on the Malnutrition Screening Tool (MST) Report  Patient reports trying to lose weight recently via portion control. Pt has tried to eat a healthy breakfast of fruit smoothies with boiled egg. Sometimes would have oatmeal with her egg. Pt states that she has had pain the past week which has lowered her intake slightly. She would like a protein supplement once her diet is advanced. Pt reports good appetite and no appetite changes this past week.  Wt Readings from Last 15 Encounters:  08/14/15 194 lb 3.6 oz (88.1 kg)  06/21/15 212 lb (96.163 kg)  01/17/15 223 lb (101.152 kg)  01/03/15 223 lb 3.2 oz (101.243 kg)  11/12/14 223 lb (101.152 kg)  12/12/13 205 lb (92.987 kg)  12/04/13 205 lb (92.987 kg)  10/27/13 205 lb 9.6 oz (93.26 kg)  09/14/13 204 lb 3.2 oz (92.625 kg)  08/19/13 217 lb (98.431 kg)    Body mass index is 33.32 kg/(m^2). Patient meets criteria for obesity based on current BMI.   Current diet order is NPO. Labs and medications reviewed.   No nutrition interventions warranted at this time. If nutrition issues arise, please consult RD.   Clayton Bibles, MS, RD, LDN Pager: [phone] After Hours Pager: [phone]

## 2015-08-15 NOTE — Progress Notes (Signed)
Hypoglycemic Event  CBG: 69  Treatment: D50 IV 25 mL  Symptoms: None  Follow-up CBG: [id number] CBG Result:130  Possible Reasons for Event: Inadequate meal intake  Comments/MD notified:MD in department. Made aware.    Wilkie Aye Dionne

## 2015-08-15 NOTE — Care Management Note (Signed)
Case Management Note  Patient Details  Name: [name] MRN: [id number] Date of Birth: 1956-02-25  Subjective/Objective:        60 yo admitted with Acute pancreatitis            Action/Plan: From home with spouse.  Expected Discharge Date:  08/17/15               Expected Discharge Plan:  Home/Self Care  In-House Referral:     Discharge planning Services  CM Consult  Post Acute Care Choice:    Choice offered to:     DME Arranged:    DME Agency:     HH Arranged:    HH Agency:     Status of Service:  In process, will continue to follow  Medicare Important Message Given:    Date Medicare IM Given:    Medicare IM give by:    Date Additional Medicare IM Given:    Additional Medicare Important Message give by:     If discussed at [place] of Stay Meetings, dates discussed:    Additional Comments: Chart reviewed and no CM needs identified or communicated at this time. CM will continue to follow. Marney Doctor RN,BSN,NCM [id number] Lynnell Catalan, RN 08/15/2015, 4:00 PM

## 2015-08-15 NOTE — Progress Notes (Signed)
[facility] Progress Note   [name]  [id number]  DOB: 1955/07/23  DOA: 08/13/2015 PCP: Gara Kroner, MD  Brief narrative: [name] is a 60 y.o. female with hypertension, type 2 diabetes mellitus, GLA deficiency presents to the ER for mid abdominal pain associated with nausea for 4-5 days. She has not had any vomiting, fevers or diarrhea. Pain feels like an ache and is nearly constant. She also complains of mid back pain which started after her abdominal pain. She was noted to have mildly elevated lipase and a CT suggestive of possible pancreatitis and duodenitis and was therefore admitted.   Subjective: Continues to have mid abdominal aching. Nausea slightly better. Still no vomiting.  Assessment/Plan: Principal Problem:   Abdominal pain suspected to be secondary to Acute pancreatitis and possible duodenitis -Lipase slightly more elevated today-patient has ongoing pain and requiring IV narcotics and therefore we will continue conservative management - NPO along with pain control and IV fluids - She was on HCTZ which is now on hold as it could've caused her pancreatitis -Lipid panel does not reveal hypertriglyceridemia -CT scan does not reveal cholelithiasis -Have started IV Protonix as well for possible duodenitis -Start senna daily to prevent constipation as she is requiring a large amount of narcotics  Active Problems:    Diabetes mellitus  -Oral medication on hold and on sliding scale insulin for now  Back pain -On exam, this appears to be musculoskeletal with increase tenderness, pain on palpation  Hypokalemia -Replaced via IV and improved    Hypotension with a history of Essential hypertension -Holding amlodipine, losartan/HCTZ as BP is low    UTI (lower urinary tract infection) -She does admit to having increased frequency of micturition prior to admission-UA is positive--urine culture growing greater than 100,000 colonies of gram-negative  rods continue Rocephin    Antibiotics: Anti-infectives    Start     Dose/Rate Route Frequency Ordered Stop   08/14/15 2200  cefTRIAXone (ROCEPHIN) 1 g in dextrose 5 % 50 mL IVPB     1 g 100 mL/hr over 30 Minutes Intravenous Every 24 hours 08/14/15 0213     08/13/15 2215  cefTRIAXone (ROCEPHIN) 1 g in dextrose 5 % 50 mL IVPB     1 g 100 mL/hr over 30 Minutes Intravenous  Once 08/13/15 2212 08/13/15 2257     Code Status:     Code Status Orders        Start     Ordered   08/14/15 0214  Full code   Continuous     08/14/15 0213    Code Status History    Date Active Date Inactive Code Status Order ID Comments User Context   12/12/2013  7:58 PM 12/15/2013  1:07 PM Full Code [id number]  Mcarthur Rossetti, MD Inpatient   08/24/2013  5:03 PM 08/29/2013  4:50 PM Full Code [id number]  Saverio Danker, PA-C Inpatient   08/19/2013  3:06 PM 08/24/2013  5:03 PM Full Code [id number]  Domenic Polite, MD Inpatient     Family Communication:  Disposition Plan: Continue to follow on MedSurg unit-home in 2-3 days hopefully DVT prophylaxis: Lovenox Consultants: None Procedures: None    Objective: Filed Weights   08/14/15 0130  Weight: 88.1 kg (194 lb 3.6 oz)    Intake/Output Summary (Last 24 hours) at 08/15/15 1332 Last data filed at 08/15/15 1100  Gross per 24 hour  Intake 3318.33 ml  Output      0 ml  Net 3318.33 ml  Vitals Filed Vitals:   08/14/15 2159 08/14/15 2204 08/15/15 0635 08/15/15 0951  BP: 129/71  104/63   Pulse: 73 68 68   Temp: 97.4 F (36.3 C)  98.8 F (37.1 C)   TempSrc: Oral  Oral   Resp: 18  18   Height:      Weight:      SpO2: 98%  94% 94%    Exam:  General:  Pt is alert, not in acute distress  HEENT: No icterus, No thrush, oral mucosa moist  Cardiovascular: regular rate and rhythm, S1/S2 No murmur  Respiratory: clear to auscultation bilaterally   Abdomen: Soft, +Bowel sounds, mid abdominal tenderness, no rebound tenderness, non distended, no  guarding  Back: Pain in lower thoracic upper lumbar area lateral to the spine (bilaterally)  MSK: No cyanosis or clubbing- no pedal edema   Data Reviewed: Basic Metabolic Panel:  Recent Labs Lab 08/13/15 1456 08/14/15 0417 08/15/15 0403  NA 137 138 141  K 3.6 3.3* 3.9  CL 99* 102 107  CO2 29 25 23   GLUCOSE 128* 126* 91  BUN 15 14 11   CREATININE 0.82 0.86 0.85  CALCIUM 9.9 8.8* 8.8*  MG  --  1.7  --   PHOS  --  3.8  --    Liver Function Tests:  Recent Labs Lab 08/13/15 1456 08/14/15 0417 08/15/15 0403  AST 13* 12* 13*  ALT 14 14 12*  ALKPHOS 79 68 67  BILITOT 0.6 0.7 0.5  PROT 7.4 6.5 6.7  ALBUMIN 4.1 3.5 3.6    Recent Labs Lab 08/13/15 1456 08/14/15 0417 08/15/15 0403  LIPASE 58* 61* 110*   No results for input(s): AMMONIA in the last 168 hours. CBC:  Recent Labs Lab 08/13/15 1456 08/14/15 0417 08/15/15 0403  WBC 9.1 9.0 9.8  NEUTROABS  --  6.0 6.8  HGB 13.0 11.9* 11.6*  HCT 39.4 36.5 35.9*  MCV 87.4 89.2 89.5  PLT 358 318 294   Cardiac Enzymes: No results for input(s): CKTOTAL, CKMB, CKMBINDEX, TROPONINI in the last 168 hours. BNP (last 3 results) No results for input(s): BNP in the last 8760 hours.  ProBNP (last 3 results) No results for input(s): PROBNP in the last 8760 hours.  CBG:  Recent Labs Lab 08/15/15 0042 08/15/15 0717 08/15/15 0750 08/15/15 0848 08/15/15 1140  GLUCAP 96 75 69 130* 79    No results found for this or any previous visit (from the past 240 hour(s)).   Studies: Ct Abdomen Pelvis W Contrast  08/13/2015  CLINICAL DATA:  Five day history of diffuse abdominal pain with constipation and nausea. EXAM: CT ABDOMEN AND PELVIS WITH CONTRAST TECHNIQUE: Multidetector CT imaging of the abdomen and pelvis was performed using the standard protocol following bolus administration of intravenous contrast. CONTRAST:  151mL OMNIPAQUE IOHEXOL 300 MG/ML  SOLN COMPARISON:  07/04/2014 FINDINGS: Lower chest:  Unremarkable.  Hepatobiliary: Small area of low attenuation in the anterior liver, adjacent to the falciform ligament, is in a characteristic location for focal fatty change. No focal abnormality within the liver parenchyma. There is no evidence for gallstones, gallbladder wall thickening, or pericholecystic fluid. No intrahepatic or extrahepatic biliary dilation. Pancreas: No focal mass lesion. No dilatation of the main duct. No intraparenchymal cyst. There is very subtle edema seen around the pancreatic head/ uncinate process and second/ third portion of the duodenum. Spleen: No splenomegaly. No focal mass lesion. Adrenals/Urinary Tract: No adrenal nodule or mass. Kidneys are normal in appearance bilaterally. No evidence for hydroureter.  The urinary bladder appears normal for the degree of distention. Stomach/Bowel: Stomach is nondistended. No gastric wall thickening. No evidence of outlet obstruction. Duodenum is normally positioned as is the ligament of Treitz. No small bowel wall thickening. No small bowel dilatation. The terminal ileum is normal. The appendix is normal. No gross colonic mass. No colonic wall thickening. No substantial diverticular change. Vascular/Lymphatic: There is abdominal aortic atherosclerosis without aneurysm. There is no gastrohepatic or hepatoduodenal ligament lymphadenopathy. No intraperitoneal or retroperitoneal lymphadenopathy. No pelvic sidewall lymphadenopathy. Reproductive: Uterus is surgically absent. There is no adnexal mass. Other: No intraperitoneal free fluid. Musculoskeletal: Pelvic floor laxity is evident. Evidence of prior ventral mesh placement in the abdomen. Patient is status post right hip replacement. Bone windows reveal no worrisome lytic or sclerotic osseous lesions. IMPRESSION: 1. Very subtle stranding around the inferior pancreatic head/uncinate process and along the medial wall of the distal descending duodenum suggests edema. This is a very subtle finding and was not  definitely seen previously, but raises the question of focal pancreatitis or duodenitis. 2. Otherwise unremarkable study. Electronically Signed   By: Misty Stanley M.D.   On: 08/13/2015 21:40    Scheduled Meds:  Scheduled Meds: . budesonide-formoterol  2 puff Inhalation BID  . cefTRIAXone (ROCEPHIN)  IV  1 g Intravenous Q24H  . enoxaparin (LOVENOX) injection  40 mg Subcutaneous Q24H  . insulin aspart  0-9 Units Subcutaneous TID WC  . latanoprost  1 drop Both Eyes QHS  . pantoprazole (PROTONIX) IV  40 mg Intravenous Q12H  . senna  2 tablet Oral Daily   Continuous Infusions: . 0.9 % NaCl with KCl 20 mEq / L 1,000 mL (08/14/15 2208)    Time spent on care of this patient: 35 min   [place], MD 08/15/2015, 1:32 PM  LOS: 2 days   [facility] Office  [phone] Pager - Text Page per [email or web address] If 7PM-7AM, please contact night-coverage [email or web address]

## 2015-08-16 ENCOUNTER — Inpatient Hospital Stay (HOSPITAL_COMMUNITY): Payer: BC Managed Care – PPO

## 2015-08-16 DIAGNOSIS — K859 Acute pancreatitis without necrosis or infection, unspecified: Secondary | ICD-10-CM | POA: Insufficient documentation

## 2015-08-16 LAB — COMPREHENSIVE METABOLIC PANEL
ALT: 13 U/L — ABNORMAL LOW (ref 14–54)
AST: 12 U/L — ABNORMAL LOW (ref 15–41)
Albumin: 3.3 g/dL — ABNORMAL LOW (ref 3.5–5.0)
Alkaline Phosphatase: 62 U/L (ref 38–126)
Anion gap: 10 (ref 5–15)
BUN: 7 mg/dL (ref 6–20)
CO2: 22 mmol/L (ref 22–32)
Calcium: 8.5 mg/dL — ABNORMAL LOW (ref 8.9–10.3)
Chloride: 110 mmol/L (ref 101–111)
Creatinine, Ser: 0.86 mg/dL (ref 0.44–1.00)
GFR calc Af Amer: >60 mL/min (ref >60)
GFR calc non Af Amer: >60 mL/min (ref >60)
Glucose, Bld: 91 mg/dL (ref 65–99)
Potassium: 4 mmol/L (ref 3.5–5.1)
Sodium: 142 mmol/L (ref 135–145)
Total Bilirubin: 0.6 mg/dL (ref 0.3–1.2)
Total Protein: 6.6 g/dL (ref 6.5–8.1)

## 2015-08-16 LAB — CBC WITH DIFFERENTIAL/PLATELET
Basophils Absolute: 0 10*3/uL (ref 0.0–0.1)
Basophils Relative: 0 %
Eosinophils Absolute: 0 10*3/uL (ref 0.0–0.7)
Eosinophils Relative: 0 %
HCT: 35.9 % — ABNORMAL LOW (ref 36.0–46.0)
Hemoglobin: 11.5 g/dL — ABNORMAL LOW (ref 12.0–15.0)
Lymphocytes Relative: 19 %
Lymphs Abs: 1.8 10*3/uL (ref 0.7–4.0)
MCH: 28.8 pg (ref 26.0–34.0)
MCHC: 32 g/dL (ref 30.0–36.0)
MCV: 90 fL (ref 78.0–100.0)
Monocytes Absolute: 0.7 10*3/uL (ref 0.1–1.0)
Monocytes Relative: 7 %
Neutro Abs: 7.1 10*3/uL (ref 1.7–7.7)
Neutrophils Relative %: 74 %
Platelets: 294 10*3/uL (ref 150–400)
RBC: 3.99 MIL/uL (ref 3.87–5.11)
RDW: 14.1 % (ref 11.5–15.5)
WBC: 9.5 10*3/uL (ref 4.0–10.5)

## 2015-08-16 LAB — GLUCOSE, CAPILLARY
Glucose-Capillary: 83 mg/dL (ref 65–99)
Glucose-Capillary: 72 mg/dL (ref 65–99)
Glucose-Capillary: 104 mg/dL — ABNORMAL HIGH (ref 65–99)
Glucose-Capillary: 120 mg/dL — ABNORMAL HIGH (ref 65–99)

## 2015-08-16 LAB — URINE CULTURE
Culture: >=100000
Special Requests: NORMAL

## 2015-08-16 LAB — LIPASE, BLOOD: Lipase: 62 U/L — ABNORMAL HIGH (ref 11–51)

## 2015-08-16 NOTE — Plan of Care (Signed)
Problem: Pain Managment: Goal: General experience of comfort will improve Outcome: Progressing Prn relieves pain

## 2015-08-16 NOTE — Progress Notes (Signed)
[facility] Progress Note   [name]  [id number]  DOB: 05/30/1956  DOA: 08/13/2015 PCP: Renee Kroner, MD  Brief narrative: [name] is a 60 y.o. female with hypertension, type 2 diabetes mellitus, GLA deficiency presents to the ER for mid abdominal pain associated with nausea for 4-5 days. She has not had any vomiting, fevers or diarrhea. Pain feels like an ache and is nearly constant. She also complains of mid back pain which started after her abdominal pain. She was noted to have mildly elevated lipase and a CT suggestive of possible pancreatitis and duodenitis and was therefore admitted.   Subjective: Abdominal pain is not severe as it was yesterday. Nausea slightly better. Still no vomiting. No BMs.   Assessment/Plan: Principal Problem:   Abdominal pain suspected to be secondary to Acute pancreatitis and possible duodenitis -Lipase slightly better with improvement in pain as well- start clear liquids - She was on HCTZ which is now on hold as it could've caused her pancreatitis -Lipid panel does not reveal hypertriglyceridemia -CT scan does not reveal cholelithiasis -Have started IV Protonix as well for possible duodenitis -Start senna daily to prevent constipation as she is requiring a large amount of narcotics  Active Problems:    Diabetes mellitus  -Oral medication on hold and on sliding scale insulin for now  Back pain -On exam, this appears to be musculoskeletal with increase tenderness, pain on palpation- improving   Hypokalemia -Replaced via IV and improved    Hypotension with a history of Essential hypertension -Holding amlodipine, losartan/HCTZ as BP is low    UTI (lower urinary tract infection) -She does admit to having increased frequency of micturition prior to admission-UA is positive--urine culture growing K Pneumoniae- has completed a 3 day course of Rocephin    Antibiotics: Anti-infectives    Start     Dose/Rate Route  Frequency Ordered Stop   08/14/15 2200  cefTRIAXone (ROCEPHIN) 1 g in dextrose 5 % 50 mL IVPB     1 g 100 mL/hr over 30 Minutes Intravenous Every 24 hours 08/14/15 0213     08/13/15 2215  cefTRIAXone (ROCEPHIN) 1 g in dextrose 5 % 50 mL IVPB     1 g 100 mL/hr over 30 Minutes Intravenous  Once 08/13/15 2212 08/13/15 2257     Code Status:     Code Status Orders        Start     Ordered   08/14/15 0214  Full code   Continuous     08/14/15 0213    Code Status History    Date Active Date Inactive Code Status Order ID Comments User Context   12/12/2013  7:58 PM 12/15/2013  1:07 PM Full Code [id number]  Mcarthur Rossetti, MD Inpatient   08/24/2013  5:03 PM 08/29/2013  4:50 PM Full Code [id number]  Saverio Danker, PA-C Inpatient   08/19/2013  3:06 PM 08/24/2013  5:03 PM Full Code [id number]  Domenic Polite, MD Inpatient     Family Communication:  Disposition Plan: Continue to follow on MedSurg unit-home in 2-3 days hopefully DVT prophylaxis: Lovenox Consultants: None Procedures: None    Objective: Filed Weights   08/14/15 0130  Weight: 88.1 kg (194 lb 3.6 oz)    Intake/Output Summary (Last 24 hours) at 08/16/15 1501 Last data filed at 08/16/15 0554  Gross per 24 hour  Intake   2482 ml  Output    300 ml  Net   2182 ml     Vitals  Filed Vitals:   08/15/15 2004 08/15/15 2200 08/16/15 0525 08/16/15 1433  BP:  130/72 130/73 118/68  Pulse: 80 76 79 72  Temp:  98.2 F (36.8 C) 98 F (36.7 C) 98.3 F (36.8 C)  TempSrc:  Oral Oral Oral  Resp:  18 18 18   Height:      Weight:      SpO2:  94% 97% 99%    Exam:  General:  Pt is alert, not in acute distress  HEENT: No icterus, No thrush, oral mucosa moist  Cardiovascular: regular rate and rhythm, S1/S2 No murmur  Respiratory: clear to auscultation bilaterally   Abdomen: Soft, +Bowel sounds, very mild mid abdominal tenderness, no rebound tenderness, non distended, no guarding  MSK: No cyanosis or clubbing- no pedal  edema   Data Reviewed: Basic Metabolic Panel:  Recent Labs Lab 08/13/15 1456 08/14/15 0417 08/15/15 0403 08/16/15 0353  NA 137 138 141 142  K 3.6 3.3* 3.9 4.0  CL 99* 102 107 110  CO2 29 25 23 22   GLUCOSE 128* 126* 91 91  BUN 15 14 11 7   CREATININE 0.82 0.86 0.85 0.86  CALCIUM 9.9 8.8* 8.8* 8.5*  MG  --  1.7  --   --   PHOS  --  3.8  --   --    Liver Function Tests:  Recent Labs Lab 08/13/15 1456 08/14/15 0417 08/15/15 0403 08/16/15 0353  AST 13* 12* 13* 12*  ALT 14 14 12* 13*  ALKPHOS 79 68 67 62  BILITOT 0.6 0.7 0.5 0.6  PROT 7.4 6.5 6.7 6.6  ALBUMIN 4.1 3.5 3.6 3.3*    Recent Labs Lab 08/13/15 1456 08/14/15 0417 08/15/15 0403 08/16/15 0353  LIPASE 58* 61* 110* 62*   No results for input(s): AMMONIA in the last 168 hours. CBC:  Recent Labs Lab 08/13/15 1456 08/14/15 0417 08/15/15 0403 08/16/15 0353  WBC 9.1 9.0 9.8 9.5  NEUTROABS  --  6.0 6.8 7.1  HGB 13.0 11.9* 11.6* 11.5*  HCT 39.4 36.5 35.9* 35.9*  MCV 87.4 89.2 89.5 90.0  PLT 358 318 294 294   Cardiac Enzymes: No results for input(s): CKTOTAL, CKMB, CKMBINDEX, TROPONINI in the last 168 hours. BNP (last 3 results) No results for input(s): BNP in the last 8760 hours.  ProBNP (last 3 results) No results for input(s): PROBNP in the last 8760 hours.  CBG:  Recent Labs Lab 08/15/15 1140 08/15/15 1545 08/15/15 2333 08/16/15 0814 08/16/15 1159  GLUCAP 79 84 87 83 72    Recent Results (from the past 240 hour(s))  Urine culture     Status: None   Collection Time: 08/13/15  8:30 PM  Result Value Ref Range Status   Specimen Description URINE, CLEAN CATCH  Final   Special Requests Normal  Final   Culture   Final    >=100,000 COLONIES/mL KLEBSIELLA PNEUMONIAE Performed at [facility]    Report Status 08/16/2015 FINAL  Final   Organism ID, Bacteria KLEBSIELLA PNEUMONIAE  Final      Susceptibility   Klebsiella pneumoniae - MIC*    AMPICILLIN >=32 RESISTANT Resistant      CEFAZOLIN <=4 SENSITIVE Sensitive     CEFTRIAXONE <=1 SENSITIVE Sensitive     CIPROFLOXACIN <=0.25 SENSITIVE Sensitive     GENTAMICIN <=1 SENSITIVE Sensitive     IMIPENEM <=0.25 SENSITIVE Sensitive     NITROFURANTOIN 32 SENSITIVE Sensitive     TRIMETH/SULFA <=20 SENSITIVE Sensitive     AMPICILLIN/SULBACTAM >=32 RESISTANT Resistant  PIP/TAZO 16 SENSITIVE Sensitive     * >=100,000 COLONIES/mL KLEBSIELLA PNEUMONIAE     Studies: Dg Abd Portable 1v  08/16/2015  CLINICAL DATA:  Abdominal pain and distention with nausea EXAM: PORTABLE ABDOMEN - 1 VIEW COMPARISON:  CT abdomen and pelvis August 13, 2015 FINDINGS: There is contrast in the ascending colon. There is no appreciable bowel dilatation or air-fluid level suggesting obstruction. No free air. Lung bases are clear. There are phleboliths the pelvis. Patient is status post total hip replacement on the right. IMPRESSION: No obstruction or free air evident.  Contrast in ascending colon. Electronically Signed   By: Lowella Grip III M.D.   On: 08/16/2015 10:02    Scheduled Meds:  Scheduled Meds: . budesonide-formoterol  2 puff Inhalation BID  . cefTRIAXone (ROCEPHIN)  IV  1 g Intravenous Q24H  . enoxaparin (LOVENOX) injection  40 mg Subcutaneous Q24H  . insulin aspart  0-9 Units Subcutaneous TID WC  . latanoprost  1 drop Both Eyes QHS  . pantoprazole (PROTONIX) IV  40 mg Intravenous Q12H  . senna  2 tablet Oral Daily   Continuous Infusions: . 0.9 % NaCl with KCl 20 mEq / L 1,000 mL (08/16/15 0246)    Time spent on care of this patient: 3 min   [place], MD 08/16/2015, 3:01 PM  LOS: 3 days   [facility] Office  [phone] Pager - Text Page per [email or web address] If 7PM-7AM, please contact night-coverage [email or web address]

## 2015-08-16 NOTE — Plan of Care (Signed)
Problem: Bowel/Gastric: Goal: Will not experience complications related to bowel motility Outcome: Progressing Patient started on laxative 2/23

## 2015-08-17 LAB — CBC WITH DIFFERENTIAL/PLATELET
Basophils Absolute: 0 10*3/uL (ref 0.0–0.1)
Basophils Relative: 0 %
Eosinophils Absolute: 0 10*3/uL (ref 0.0–0.7)
Eosinophils Relative: 0 %
HCT: 34.5 % — ABNORMAL LOW (ref 36.0–46.0)
Hemoglobin: 11 g/dL — ABNORMAL LOW (ref 12.0–15.0)
Lymphocytes Relative: 19 %
Lymphs Abs: 1.5 10*3/uL (ref 0.7–4.0)
MCH: 28.7 pg (ref 26.0–34.0)
MCHC: 31.9 g/dL (ref 30.0–36.0)
MCV: 90.1 fL (ref 78.0–100.0)
Monocytes Absolute: 0.8 10*3/uL (ref 0.1–1.0)
Monocytes Relative: 10 %
Neutro Abs: 5.5 10*3/uL (ref 1.7–7.7)
Neutrophils Relative %: 71 %
Platelets: 302 10*3/uL (ref 150–400)
RBC: 3.83 MIL/uL — ABNORMAL LOW (ref 3.87–5.11)
RDW: 14.3 % (ref 11.5–15.5)
WBC: 7.8 10*3/uL (ref 4.0–10.5)

## 2015-08-17 LAB — GLUCOSE, CAPILLARY
Glucose-Capillary: 111 mg/dL — ABNORMAL HIGH (ref 65–99)
Glucose-Capillary: 91 mg/dL (ref 65–99)
Glucose-Capillary: 94 mg/dL (ref 65–99)
Glucose-Capillary: 110 mg/dL — ABNORMAL HIGH (ref 65–99)

## 2015-08-17 LAB — LIPASE, BLOOD: Lipase: 56 U/L — ABNORMAL HIGH (ref 11–51)

## 2015-08-17 NOTE — Progress Notes (Signed)
[facility] Progress Note   [name]  [id number]  DOB: 10-29-1955  DOA: 08/13/2015 PCP: Renee Kroner, MD  Brief narrative: [name] is a 60 y.o. female with hypertension, type 2 diabetes mellitus, GLA deficiency presents to the ER for mid abdominal pain associated with nausea for 4-5 days. She has not had any vomiting, fevers or diarrhea. Pain feels like an ache and is nearly constant. She also complains of mid back pain which started after her abdominal pain. She was noted to have mildly elevated lipase and a CT suggestive of possible pancreatitis and duodenitis and was therefore admitted.   Subjective: Abdominal pain is not severe as it was yesterday. Nausea slightly better. Still no vomiting. No BMs.   Assessment/Plan: Principal Problem:   Abdominal pain suspected to be secondary to Acute pancreatitis and possible duodenitis -Lipase slightly better with improvement in pain as well- was tolerating clears but had pain with full liquids today- will transition back to clears - She was on HCTZ which is now on hold as it could've caused her pancreatitis -Lipid panel does not reveal hypertriglyceridemia -CT scan does not reveal cholelithiasis -Have started IV Protonix as well for possible duodenitis -Start senna daily to prevent constipation as she is requiring a large amount of narcotics  Active Problems:    Diabetes mellitus  -Oral medication on hold and on sliding scale insulin for now  Back pain -On exam, this appears to be musculoskeletal with increase tenderness, pain on palpation- improving   Hypokalemia -Replaced via IV and improved    Hypotension with a history of Essential hypertension -Holding amlodipine, losartan/HCTZ as BP is low    UTI (lower urinary tract infection) -She does admit to having increased frequency of micturition prior to admission-UA is positive--urine culture growing K Pneumoniae- has completed a 3 day course of  Rocephin    Antibiotics: Anti-infectives    Start     Dose/Rate Route Frequency Ordered Stop   08/14/15 2200  cefTRIAXone (ROCEPHIN) 1 g in dextrose 5 % 50 mL IVPB  Status:  Discontinued     1 g 100 mL/hr over 30 Minutes Intravenous Every 24 hours 08/14/15 0213 08/16/15 1505   08/13/15 2215  cefTRIAXone (ROCEPHIN) 1 g in dextrose 5 % 50 mL IVPB     1 g 100 mL/hr over 30 Minutes Intravenous  Once 08/13/15 2212 08/13/15 2257     Code Status:     Code Status Orders        Start     Ordered   08/14/15 0214  Full code   Continuous     08/14/15 0213    Code Status History    Date Active Date Inactive Code Status Order ID Comments User Context   12/12/2013  7:58 PM 12/15/2013  1:07 PM Full Code [id number]  Mcarthur Rossetti, MD Inpatient   08/24/2013  5:03 PM 08/29/2013  4:50 PM Full Code [id number]  Saverio Danker, PA-C Inpatient   08/19/2013  3:06 PM 08/24/2013  5:03 PM Full Code [id number]  Domenic Polite, MD Inpatient     Family Communication:  Disposition Plan: Continue to follow on MedSurg unit-home in 2-3 days hopefully DVT prophylaxis: Lovenox Consultants: None Procedures: None    Objective: Filed Weights   08/14/15 0130 08/17/15 0700  Weight: 88.1 kg (194 lb 3.6 oz) 90.8 kg (200 lb 2.8 oz)    Intake/Output Summary (Last 24 hours) at 08/17/15 1437 Last data filed at 08/17/15 0500  Gross per 24 hour  Intake    360 ml  Output      0 ml  Net    360 ml     Vitals Filed Vitals:   08/16/15 2135 08/17/15 0657 08/17/15 0700 08/17/15 1424  BP: 135/65 129/80  148/112  Pulse: 77 67  82  Temp: 98.5 F (36.9 C) 98.1 F (36.7 C)  98 F (36.7 C)  TempSrc: Oral Oral  Oral  Resp: 18 18  18   Height:      Weight:   90.8 kg (200 lb 2.8 oz)   SpO2: 95% 98%  100%    Exam:  General:  Pt is alert, not in acute distress  HEENT: No icterus, No thrush, oral mucosa moist  Cardiovascular: regular rate and rhythm, S1/S2 No murmur  Respiratory: clear to auscultation  bilaterally   Abdomen: Soft, +Bowel sounds, very mild mid abdominal tenderness, no rebound tenderness, non distended, no guarding  MSK: No cyanosis or clubbing- no pedal edema   Data Reviewed: Basic Metabolic Panel:  Recent Labs Lab 08/13/15 1456 08/14/15 0417 08/15/15 0403 08/16/15 0353  NA 137 138 141 142  K 3.6 3.3* 3.9 4.0  CL 99* 102 107 110  CO2 29 25 23 22   GLUCOSE 128* 126* 91 91  BUN 15 14 11 7   CREATININE 0.82 0.86 0.85 0.86  CALCIUM 9.9 8.8* 8.8* 8.5*  MG  --  1.7  --   --   PHOS  --  3.8  --   --    Liver Function Tests:  Recent Labs Lab 08/13/15 1456 08/14/15 0417 08/15/15 0403 08/16/15 0353  AST 13* 12* 13* 12*  ALT 14 14 12* 13*  ALKPHOS 79 68 67 62  BILITOT 0.6 0.7 0.5 0.6  PROT 7.4 6.5 6.7 6.6  ALBUMIN 4.1 3.5 3.6 3.3*    Recent Labs Lab 08/13/15 1456 08/14/15 0417 08/15/15 0403 08/16/15 0353 08/17/15 0349  LIPASE 58* 61* 110* 62* 56*   No results for input(s): AMMONIA in the last 168 hours. CBC:  Recent Labs Lab 08/13/15 1456 08/14/15 0417 08/15/15 0403 08/16/15 0353 08/17/15 0349  WBC 9.1 9.0 9.8 9.5 7.8  NEUTROABS  --  6.0 6.8 7.1 5.5  HGB 13.0 11.9* 11.6* 11.5* 11.0*  HCT 39.4 36.5 35.9* 35.9* 34.5*  MCV 87.4 89.2 89.5 90.0 90.1  PLT 358 318 294 294 302   Cardiac Enzymes: No results for input(s): CKTOTAL, CKMB, CKMBINDEX, TROPONINI in the last 168 hours. BNP (last 3 results) No results for input(s): BNP in the last 8760 hours.  ProBNP (last 3 results) No results for input(s): PROBNP in the last 8760 hours.  CBG:  Recent Labs Lab 08/16/15 1159 08/16/15 1653 08/16/15 2141 08/17/15 0733 08/17/15 1203  GLUCAP 72 104* 120* 111* 91    Recent Results (from the past 240 hour(s))  Urine culture     Status: None   Collection Time: 08/13/15  8:30 PM  Result Value Ref Range Status   Specimen Description URINE, CLEAN CATCH  Final   Special Requests Normal  Final   Culture   Final    >=100,000 COLONIES/mL  KLEBSIELLA PNEUMONIAE Performed at [facility]    Report Status 08/16/2015 FINAL  Final   Organism ID, Bacteria KLEBSIELLA PNEUMONIAE  Final      Susceptibility   Klebsiella pneumoniae - MIC*    AMPICILLIN >=32 RESISTANT Resistant     CEFAZOLIN <=4 SENSITIVE Sensitive     CEFTRIAXONE <=1 SENSITIVE Sensitive     CIPROFLOXACIN <=  0.25 SENSITIVE Sensitive     GENTAMICIN <=1 SENSITIVE Sensitive     IMIPENEM <=0.25 SENSITIVE Sensitive     NITROFURANTOIN 32 SENSITIVE Sensitive     TRIMETH/SULFA <=20 SENSITIVE Sensitive     AMPICILLIN/SULBACTAM >=32 RESISTANT Resistant     PIP/TAZO 16 SENSITIVE Sensitive     * >=100,000 COLONIES/mL KLEBSIELLA PNEUMONIAE     Studies: Dg Abd Portable 1v  08/16/2015  CLINICAL DATA:  Abdominal pain and distention with nausea EXAM: PORTABLE ABDOMEN - 1 VIEW COMPARISON:  CT abdomen and pelvis August 13, 2015 FINDINGS: There is contrast in the ascending colon. There is no appreciable bowel dilatation or air-fluid level suggesting obstruction. No free air. Lung bases are clear. There are phleboliths the pelvis. Patient is status post total hip replacement on the right. IMPRESSION: No obstruction or free air evident.  Contrast in ascending colon. Electronically Signed   By: Lowella Grip III M.D.   On: 08/16/2015 10:02    Scheduled Meds:  Scheduled Meds: . budesonide-formoterol  2 puff Inhalation BID  . enoxaparin (LOVENOX) injection  40 mg Subcutaneous Q24H  . insulin aspart  0-9 Units Subcutaneous TID WC  . latanoprost  1 drop Both Eyes QHS  . pantoprazole (PROTONIX) IV  40 mg Intravenous Q12H  . senna  2 tablet Oral Daily   Continuous Infusions: . 0.9 % NaCl with KCl 20 mEq / L 50 mL/hr at 08/17/15 [id number]    Time spent on care of this patient: 35 min   [place], MD 08/17/2015, 2:37 PM  LOS: 4 days   [facility] Office  [phone] Pager - Text Page per [email or web address] If 7PM-7AM, please contact night-coverage  [email or web address]

## 2015-08-18 DIAGNOSIS — R935 Abnormal findings on diagnostic imaging of other abdominal regions, including retroperitoneum: Secondary | ICD-10-CM | POA: Insufficient documentation

## 2015-08-18 DIAGNOSIS — K8500 Idiopathic acute pancreatitis without necrosis or infection: Secondary | ICD-10-CM

## 2015-08-18 LAB — CBC WITH DIFFERENTIAL/PLATELET
Basophils Absolute: 0 10*3/uL (ref 0.0–0.1)
Basophils Relative: 0 %
Eosinophils Absolute: 0.1 10*3/uL (ref 0.0–0.7)
Eosinophils Relative: 1 %
HCT: 35.2 % — ABNORMAL LOW (ref 36.0–46.0)
Hemoglobin: 11.3 g/dL — ABNORMAL LOW (ref 12.0–15.0)
Lymphocytes Relative: 26 %
Lymphs Abs: 1.7 10*3/uL (ref 0.7–4.0)
MCH: 29 pg (ref 26.0–34.0)
MCHC: 32.1 g/dL (ref 30.0–36.0)
MCV: 90.3 fL (ref 78.0–100.0)
Monocytes Absolute: 0.5 10*3/uL (ref 0.1–1.0)
Monocytes Relative: 8 %
Neutro Abs: 4.3 10*3/uL (ref 1.7–7.7)
Neutrophils Relative %: 65 %
Platelets: 318 10*3/uL (ref 150–400)
RBC: 3.9 MIL/uL (ref 3.87–5.11)
RDW: 14.5 % (ref 11.5–15.5)
WBC: 6.6 10*3/uL (ref 4.0–10.5)

## 2015-08-18 LAB — GLUCOSE, CAPILLARY
Glucose-Capillary: 90 mg/dL (ref 65–99)
Glucose-Capillary: 109 mg/dL — ABNORMAL HIGH (ref 65–99)
Glucose-Capillary: 81 mg/dL (ref 65–99)

## 2015-08-18 LAB — LIPASE, BLOOD: Lipase: 48 U/L (ref 11–51)

## 2015-08-18 NOTE — Progress Notes (Signed)
[facility] Progress Note   [name]  [id number]  DOB: 01/17/1956  DOA: 08/13/2015 PCP: Gara Kroner, MD  Brief narrative: [name] is a 60 y.o. female with hypertension, type 2 diabetes mellitus, GLA deficiency presents to the ER for mid abdominal pain associated with nausea for 4-5 days. She has not had any vomiting, fevers or diarrhea. Pain feels like an ache and is nearly constant. She also complains of mid back pain which started after her abdominal pain. She was noted to have mildly elevated lipase and a CT suggestive of possible pancreatitis and duodenitis and was therefore admitted.   Subjective: Abdominal pain was severe after taking full liquids yesterday and therefore she was back down to clear liquids-she is still requiring quite a bit of pain medicine but abdominal pain has improved. Nausea slightly better. Still no vomiting. No BMs.   Assessment/Plan: Principal Problem:   Abdominal pain suspected to be secondary to Acute pancreatitis and possible duodenitis -Lipase has normalized but due to the fact that she had pain with full liquids yesterday, she is back down to clear liquids now-as she is not improving, I have asked for a GI consult - She was on HCTZ which is now on hold as it could've caused her pancreatitis -Lipid panel does not reveal hypertriglyceridemia -CT scan does not reveal cholelithiasis -Have started IV Protonix as well for possible duodenitis -Start senna daily to prevent constipation as she is requiring a large amount of narcotics  Active Problems:    Diabetes mellitus  -Oral medication on hold and on sliding scale insulin for now  Back pain -On exam, this appears to be musculoskeletal with increase tenderness, pain on palpation- improving   Hypokalemia -Replaced via IV and improved    Hypotension with a history of Essential hypertension -Holding amlodipine, losartan/HCTZ as BP is low    UTI (lower urinary tract  infection) -She does admit to having increased frequency of micturition prior to admission-UA is positive--urine culture growing K Pneumoniae- has completed a 3 day course of Rocephin    Antibiotics: Anti-infectives    Start     Dose/Rate Route Frequency Ordered Stop   08/14/15 2200  cefTRIAXone (ROCEPHIN) 1 g in dextrose 5 % 50 mL IVPB  Status:  Discontinued     1 g 100 mL/hr over 30 Minutes Intravenous Every 24 hours 08/14/15 0213 08/16/15 1505   08/13/15 2215  cefTRIAXone (ROCEPHIN) 1 g in dextrose 5 % 50 mL IVPB     1 g 100 mL/hr over 30 Minutes Intravenous  Once 08/13/15 2212 08/13/15 2257     Code Status:     Code Status Orders        Start     Ordered   08/14/15 0214  Full code   Continuous     08/14/15 0213    Code Status History    Date Active Date Inactive Code Status Order ID Comments User Context   12/12/2013  7:58 PM 12/15/2013  1:07 PM Full Code [id number]  Mcarthur Rossetti, MD Inpatient   08/24/2013  5:03 PM 08/29/2013  4:50 PM Full Code [id number]  Saverio Danker, PA-C Inpatient   08/19/2013  3:06 PM 08/24/2013  5:03 PM Full Code [id number]  Domenic Polite, MD Inpatient     Family Communication:  Disposition Plan: Continue to follow on MedSurg unit-home in 2-3 days hopefully DVT prophylaxis: Lovenox Consultants: None Procedures: None    Objective: Filed Weights   08/14/15 0130 08/17/15 0700  Weight:  88.1 kg (194 lb 3.6 oz) 90.8 kg (200 lb 2.8 oz)    Intake/Output Summary (Last 24 hours) at 08/18/15 1529 Last data filed at 08/18/15 0530  Gross per 24 hour  Intake      0 ml  Output    400 ml  Net   -400 ml     Vitals Filed Vitals:   08/17/15 2117 08/18/15 0207 08/18/15 0528 08/18/15 1419  BP:  135/82 137/68 139/96  Pulse:  68 65 53  Temp:   98 F (36.7 C) 98 F (36.7 C)  TempSrc:   Oral Oral  Resp:   16 18  Height:      Weight:      SpO2: 98%  95% 99%    Exam:  General:  Pt is alert, not in acute distress  HEENT: No icterus, No  thrush, oral mucosa moist  Cardiovascular: regular rate and rhythm, S1/S2 No murmur  Respiratory: clear to auscultation bilaterally   Abdomen: Soft, +Bowel sounds, very mild mid abdominal tenderness, no rebound tenderness, non distended, no guarding  MSK: No cyanosis or clubbing- no pedal edema   Data Reviewed: Basic Metabolic Panel:  Recent Labs Lab 08/13/15 1456 08/14/15 0417 08/15/15 0403 08/16/15 0353  NA 137 138 141 142  K 3.6 3.3* 3.9 4.0  CL 99* 102 107 110  CO2 29 25 23 22   GLUCOSE 128* 126* 91 91  BUN 15 14 11 7   CREATININE 0.82 0.86 0.85 0.86  CALCIUM 9.9 8.8* 8.8* 8.5*  MG  --  1.7  --   --   PHOS  --  3.8  --   --    Liver Function Tests:  Recent Labs Lab 08/13/15 1456 08/14/15 0417 08/15/15 0403 08/16/15 0353  AST 13* 12* 13* 12*  ALT 14 14 12* 13*  ALKPHOS 79 68 67 62  BILITOT 0.6 0.7 0.5 0.6  PROT 7.4 6.5 6.7 6.6  ALBUMIN 4.1 3.5 3.6 3.3*    Recent Labs Lab 08/14/15 0417 08/15/15 0403 08/16/15 0353 08/17/15 0349 08/18/15 0349  LIPASE 61* 110* 62* 56* 48   No results for input(s): AMMONIA in the last 168 hours. CBC:  Recent Labs Lab 08/14/15 0417 08/15/15 0403 08/16/15 0353 08/17/15 0349 08/18/15 0349  WBC 9.0 9.8 9.5 7.8 6.6  NEUTROABS 6.0 6.8 7.1 5.5 4.3  HGB 11.9* 11.6* 11.5* 11.0* 11.3*  HCT 36.5 35.9* 35.9* 34.5* 35.2*  MCV 89.2 89.5 90.0 90.1 90.3  PLT 318 294 294 302 318   Cardiac Enzymes: No results for input(s): CKTOTAL, CKMB, CKMBINDEX, TROPONINI in the last 168 hours. BNP (last 3 results) No results for input(s): BNP in the last 8760 hours.  ProBNP (last 3 results) No results for input(s): PROBNP in the last 8760 hours.  CBG:  Recent Labs Lab 08/17/15 1203 08/17/15 1657 08/17/15 2143 08/18/15 0740 08/18/15 1214  GLUCAP 91 94 110* 90 109*    Recent Results (from the past 240 hour(s))  Urine culture     Status: None   Collection Time: 08/13/15  8:30 PM  Result Value Ref Range Status   Specimen  Description URINE, CLEAN CATCH  Final   Special Requests Normal  Final   Culture   Final    >=100,000 COLONIES/mL KLEBSIELLA PNEUMONIAE Performed at [facility]    Report Status 08/16/2015 FINAL  Final   Organism ID, Bacteria KLEBSIELLA PNEUMONIAE  Final      Susceptibility   Klebsiella pneumoniae - MIC*    AMPICILLIN >=  32 RESISTANT Resistant     CEFAZOLIN <=4 SENSITIVE Sensitive     CEFTRIAXONE <=1 SENSITIVE Sensitive     CIPROFLOXACIN <=0.25 SENSITIVE Sensitive     GENTAMICIN <=1 SENSITIVE Sensitive     IMIPENEM <=0.25 SENSITIVE Sensitive     NITROFURANTOIN 32 SENSITIVE Sensitive     TRIMETH/SULFA <=20 SENSITIVE Sensitive     AMPICILLIN/SULBACTAM >=32 RESISTANT Resistant     PIP/TAZO 16 SENSITIVE Sensitive     * >=100,000 COLONIES/mL KLEBSIELLA PNEUMONIAE     Studies: No results found.  Scheduled Meds:  Scheduled Meds: . budesonide-formoterol  2 puff Inhalation BID  . enoxaparin (LOVENOX) injection  40 mg Subcutaneous Q24H  . insulin aspart  0-9 Units Subcutaneous TID WC  . latanoprost  1 drop Both Eyes QHS  . pantoprazole (PROTONIX) IV  40 mg Intravenous Q12H  . senna  2 tablet Oral Daily   Continuous Infusions: . 0.9 % NaCl with KCl 20 mEq / L 50 mL/hr at 08/18/15 0207    Time spent on care of this patient: 47 min   [employer], MD 08/18/2015, 3:29 PM  LOS: 5 days   [facility] Office  [phone] Pager - Text Page per [email or web address] If 7PM-7AM, please contact night-coverage [email or web address]

## 2015-08-18 NOTE — Consult Note (Signed)
Referring Provider: No ref. provider found Primary Care Physician:  Gara Kroner, MD Primary Gastroenterologist:  Dr. Henrene Pastor  Reason for Consultation:  Abdominal pain and pancreatitis  HPI: [name] is a 60 y.o. female with hypertension, type 2 diabetes mellitus, GLA deficiency presented to the ER at [facility] hospital on 2/21 for mid abdominal pain associated with nausea for 4-5 days. Says that pain began suddenly on Thursday, 2/16 and was persistent/worsening.  She was noted to have mildly elevated lipase at 110 and a CT suggestive of possible pancreatitis or duodenitis and was therefore admitted.  She has now been here for 5 days.  Lipase has normalized.  Did well with clear liquids and then advanced to full liquids, but they "tore my stomach up".  Back on clear liquids and actually vomited after eating a popsicle earlier today.  Looks well and comfortable to me currently.  Using large amounts of Dilaudid with 2 grams every 3-4 hours still.  LFT's normal.  Denies ETOH use.  Is on prilosec 40 mg daily at home.    Last EGD 08/2013 by Dr. Henrene Pastor was normal.   Past Medical History  Diagnosis Date  . Hypertension   . Diabetes mellitus without complication ([place])   . Anemia 2006    required transfusion post TAH/BSO 05/2005  . GLA deficiency ([place])   . Glaucoma of both eyes   . Shortness of breath dyspnea   . Arthritis   . Chronic cough   . GERD (gastroesophageal reflux disease)     on prilosec    Past Surgical History  Procedure Laterality Date  . Total abdominal hysterectomy w/ bilateral salpingoophorectomy  05/2005  . Hysteroscopy w/d&c  01/2005    for uterine fibroids.   . Esophagogastroduodenoscopy N/A 08/22/2013    Procedure: ESOPHAGOGASTRODUODENOSCOPY (EGD);  Surgeon: Irene Shipper, MD;  Location: [facility] ENDOSCOPY;  Service: Endoscopy;  Laterality: N/A;  . Colonoscopy N/A 08/22/2013    Procedure: COLONOSCOPY;  Surgeon: Irene Shipper, MD;  Location: [place];  Service: Endoscopy;   Laterality: N/A;  . Ventral hernia repair N/A 08/24/2013    Procedure: HERNIA REPAIR VENTRAL ADULT;  Surgeon: Edward Jolly, MD;  Location: [place];  Service: General;  Laterality: N/A;  . Insertion of mesh N/A 08/24/2013    Procedure: INSERTION OF MESH;  Surgeon: Edward Jolly, MD;  Location: [place];  Service: General;  Laterality: N/A;  . Panniculectomy N/A 08/24/2013    Procedure: PANNICULECTOMY;  Surgeon: Edward Jolly, MD;  Location: [place];  Service: General;  Laterality: N/A;  . Total hip arthroplasty Right 12/12/2013    Procedure: RIGHT TOTAL HIP ARTHROPLASTY ANTERIOR APPROACH;  Surgeon: Mcarthur Rossetti, MD;  Location: [place];  Service: Orthopedics;  Laterality: Right;  . Abdominal hysterectomy    . Hernia repair    . Joint replacement Right   . Lipoma excision Left 06/21/2015    Procedure: EXCISION OF LEFT SCALP LIPOMA;  Surgeon: Johnathan Hausen, MD;  Location: [place];  Service: General;  Laterality: Left;    Prior to Admission medications   Medication Sig Start Date End Date Taking? Authorizing Provider  amLODipine (NORVASC) 2.5 MG tablet Take 2.5 mg by mouth daily.   Yes Historical Provider, MD  aspirin 81 MG tablet Take 81 mg by mouth daily.   Yes Historical Provider, MD  azithromycin (ZITHROMAX) 250 MG tablet Take 250-500 mg by mouth daily. ABT Start Date 08/10/15 & End Date 08/14/15. Med Instructions:Take 2 tablets  on Day 1 then Take 1 tablet daily for 4 days. 08/08/15  Yes Historical Provider, MD  bimatoprost (LUMIGAN) 0.03 % ophthalmic solution Place 1 drop into both eyes at bedtime.   Yes Historical Provider, MD  budesonide-formoterol (SYMBICORT) 80-4.5 MCG/ACT inhaler Take 2 puffs first thing in am and then another 2 puffs about 12 hours later. 01/03/15  Yes Tanda Rockers, MD  fluticasone (FLONASE) 50 MCG/ACT nasal spray Place 1 spray into both nostrils daily as needed for allergies.  07/13/15  Yes Historical Provider, MD  glimepiride (AMARYL) 4 MG  tablet Take 4 mg by mouth daily with breakfast.   Yes Historical Provider, MD  losartan-hydrochlorothiazide (HYZAAR) 100-25 MG tablet Take 1 tablet by mouth daily.  07/13/15  Yes Historical Provider, MD  MELATONIN PO Take 1 tablet by mouth at bedtime as needed (sleep).    Yes Historical Provider, MD  metFORMIN (GLUCOPHAGE) 1000 MG tablet Take 1,000 mg by mouth 2 (two) times daily with a meal.   Yes Historical Provider, MD  omeprazole (PRILOSEC) 40 MG capsule Take 40 mg by mouth daily.  07/13/15  Yes Historical Provider, MD  oxyCODONE-acetaminophen (PERCOCET/ROXICET) 5-325 MG tablet Take 1 tablet by mouth every 6 (six) hours as needed for severe pain.   Yes Historical Provider, MD  albuterol (PROVENTIL HFA;VENTOLIN HFA) 108 (90 BASE) MCG/ACT inhaler Inhale 2 puffs into the lungs every 6 (six) hours as needed for wheezing or shortness of breath. 02/20/15   Billy Fischer, MD  albuterol (PROVENTIL) (2.5 MG/3ML) 0.083% nebulizer solution Take 2.5 mg by nebulization every 6 (six) hours as needed.  07/13/15   Historical Provider, MD  benzonatate (TESSALON) 200 MG capsule One four times daily as needed for cough Patient not taking: Reported on 08/13/2015 01/03/15   Tanda Rockers, MD  HYDROcodone-acetaminophen (NORCO) 5-325 MG tablet Take 1 tablet by mouth every 4 (four) hours as needed for moderate pain. Patient not taking: Reported on 08/13/2015 06/21/15   Johnathan Hausen, MD    Current Facility-Administered Medications  Medication Dose Route Frequency Provider Last Rate Last Dose  . 0.9 % NaCl with KCl 20 mEq/ L  infusion   Intravenous Continuous Debbe Odea, MD 50 mL/hr at 08/18/15 0207    . budesonide-formoterol (SYMBICORT) 80-4.5 MCG/ACT inhaler 2 puff  2 puff Inhalation BID Reubin Milan, MD   2 puff at 08/17/15 2116  . enoxaparin (LOVENOX) injection 40 mg  40 mg Subcutaneous Q24H Debbe Odea, MD   40 mg at 08/17/15 2134  . HYDROmorphone (DILAUDID) injection 1-2 mg  1-2 mg Intravenous Q3H PRN Debbe Odea, MD   2 mg at 08/18/15 0813  . insulin aspart (novoLOG) injection 0-9 Units  0-9 Units Subcutaneous TID WC Debbe Odea, MD   0 Units at 08/14/15 1200  . latanoprost (XALATAN) 0.005 % ophthalmic solution 1 drop  1 drop Both Eyes QHS Reubin Milan, MD   1 drop at 08/17/15 2141  . ondansetron (ZOFRAN) tablet 4 mg  4 mg Oral Q6H PRN Reubin Milan, MD       Or  . ondansetron [facility]) injection 4 mg  4 mg Intravenous Q6H PRN Reubin Milan, MD   4 mg at 08/18/15 0504  . pantoprazole (PROTONIX) injection 40 mg  40 mg Intravenous Q12H Debbe Odea, MD   40 mg at 08/18/15 0813  . senna (SENOKOT) tablet 17.2 mg  2 tablet Oral Daily Debbe Odea, MD   17.2 mg at 08/18/15 [phone]  Allergies as of 08/13/2015 - Review Complete 08/13/2015  Allergen Reaction Noted  . Diclofenac  05/16/2014  . Penicillins Hives 08/19/2013    Family History  Problem Relation Age of Onset  . Emphysema Mother     smoked  . Colon cancer Father     Social History   Social History  . Marital Status: Married    Spouse Name: N/A  . Number of Children: N/A  . Years of Education: N/A   Occupational History  . Social Worker    Social History Main Topics  . Smoking status: Former Smoker -- 0.25 packs/day for 15 years    Types: Cigarettes    Quit date: 06/22/1978  . Smokeless tobacco: Never Used  . Alcohol Use: No  . Drug Use: No  . Sexual Activity: Not on file   Other Topics Concern  . Not on file   Social History Narrative    Review of Systems: Ten point ROS is O/W negative except as mentioned in HPI.  Physical Exam: Vital signs in last 24 hours: Temp:  [97.5 F (36.4 C)-98 F (36.7 C)] 98 F (36.7 C) (02/26 0528) Pulse Rate:  [65-82] 65 (02/26 0528) Resp:  [16-18] 16 (02/26 0528) BP: (135-169)/(68-112) 137/68 mmHg (02/26 0528) SpO2:  [95 %-100 %] 95 % (02/26 0528) Last BM Date: 08/12/15 General:  Alert, Well-developed, well-nourished, pleasant and cooperative in NAD Head:   Normocephalic and atraumatic. Eyes:  Sclera clear, no icterus.  Conjunctiva pink. Ears:  Normal auditory acuity. Mouth:  No deformity or lesions.   Lungs:  Clear throughout to auscultation. No wheezes, crackles, or rhonchi.  Heart:  Regular rate and rhythm; no murmurs, clicks, rubs, or gallops. Abdomen:  Soft, non-distended.  BS present.  Mild upper abdominal TTP.   Rectal:  Deferred  Msk:  Symmetrical without gross deformities.  Pulses:  Normal pulses noted. Extremities:  Without clubbing or edema. Neurologic:  Alert and oriented x 4;  grossly normal neurologically. Skin:  Intact without significant lesions or rashes. Psych:  Alert and cooperative. Normal mood and affect.  Intake/Output from previous day: 02/25 0701 - 02/26 0700 In: -  Out: 400 [Urine:400]  Lab Results:  Recent Labs  08/16/15 0353 08/17/15 0349 08/18/15 0349  WBC 9.5 7.8 6.6  HGB 11.5* 11.0* 11.3*  HCT 35.9* 34.5* 35.2*  PLT 294 302 318   BMET  Recent Labs  08/16/15 0353  NA 142  K 4.0  CL 110  CO2 22  GLUCOSE 91  BUN 7  CREATININE 0.86  CALCIUM 8.5*   LFT  Recent Labs  08/16/15 0353  PROT 6.6  ALBUMIN 3.3*  AST 12*  ALT 13*  ALKPHOS 62  BILITOT 0.6    IMPRESSION:  -Abdominal pain of sudden onset suspected to be secondary to acute pancreatitis or possible duodenitis.  Lipase only very mildly elevated for pancreatitis and this elevation can be seen in the setting of nausea and vomiting as well so unsure if this is actually pancreatitis.  She denies ETOH use and has no sign of cholelithiasis/CBD stones and LFT's are normal.  Was on HCTZ, which is on hold and can cause pancreatitis.  PLAN: -Continue clear liquids, IVF's, pain control, and antiemetics for now. -? EGD to rule out duodenitis/ulcer. -Continue pantoprazole 40 mg IV BID for now.  Pansey Pinheiro D.  08/18/2015, 10:53 AM  Pager number [phone]

## 2015-08-19 ENCOUNTER — Encounter (HOSPITAL_COMMUNITY): Payer: Self-pay

## 2015-08-19 ENCOUNTER — Inpatient Hospital Stay (HOSPITAL_COMMUNITY): Payer: BC Managed Care – PPO | Admitting: Anesthesiology

## 2015-08-19 ENCOUNTER — Encounter (HOSPITAL_COMMUNITY)
Admission: EM | Disposition: A | Discharge status: Home / Self Care | Payer: Self-pay | Source: Home / Self Care | Attending: Internal Medicine

## 2015-08-19 DIAGNOSIS — R1013 Epigastric pain: Secondary | ICD-10-CM | POA: Insufficient documentation

## 2015-08-19 HISTORY — PX: ESOPHAGOGASTRODUODENOSCOPY (EGD) WITH PROPOFOL: SHX5813

## 2015-08-19 LAB — CBC WITH DIFFERENTIAL/PLATELET
Basophils Absolute: 0 10*3/uL (ref 0.0–0.1)
Basophils Relative: 0 %
Eosinophils Absolute: 0.1 10*3/uL (ref 0.0–0.7)
Eosinophils Relative: 2 %
HCT: 33.9 % — ABNORMAL LOW (ref 36.0–46.0)
Hemoglobin: 11.1 g/dL — ABNORMAL LOW (ref 12.0–15.0)
Lymphocytes Relative: 28 %
Lymphs Abs: 1.5 10*3/uL (ref 0.7–4.0)
MCH: 29.4 pg (ref 26.0–34.0)
MCHC: 32.7 g/dL (ref 30.0–36.0)
MCV: 89.7 fL (ref 78.0–100.0)
Monocytes Absolute: 0.5 10*3/uL (ref 0.1–1.0)
Monocytes Relative: 9 %
Neutro Abs: 3.1 10*3/uL (ref 1.7–7.7)
Neutrophils Relative %: 61 %
Platelets: 337 10*3/uL (ref 150–400)
RBC: 3.78 MIL/uL — ABNORMAL LOW (ref 3.87–5.11)
RDW: 14.4 % (ref 11.5–15.5)
WBC: 5.2 10*3/uL (ref 4.0–10.5)

## 2015-08-19 LAB — GLUCOSE, CAPILLARY
Glucose-Capillary: 110 mg/dL — ABNORMAL HIGH (ref 65–99)
Glucose-Capillary: 103 mg/dL — ABNORMAL HIGH (ref 65–99)
Glucose-Capillary: 106 mg/dL — ABNORMAL HIGH (ref 65–99)

## 2015-08-19 LAB — LIPASE, BLOOD: Lipase: 52 U/L — ABNORMAL HIGH (ref 11–51)

## 2015-08-19 SURGERY — ESOPHAGOGASTRODUODENOSCOPY (EGD) WITH PROPOFOL
Anesthesia: Monitor Anesthesia Care

## 2015-08-19 MED ORDER — PROPOFOL 10 MG/ML IV BOLUS
INTRAVENOUS | Status: DC | PRN
  Administered 2015-08-19: 20 mg via INTRAVENOUS
  Administered 2015-08-19: 50 mg via INTRAVENOUS
  Administered 2015-08-19 (×2): 30 mg via INTRAVENOUS

## 2015-08-19 MED ORDER — PREMIER PROTEIN SHAKE
8.0000 [oz_av] | Freq: Two times a day (BID) | ORAL | Status: DC
  Administered 2015-08-19 – 2015-08-21 (×4): 8 [oz_av] via ORAL
  Filled 2015-08-19 (×3): qty 325.31

## 2015-08-19 MED ORDER — PROPOFOL 500 MG/50ML IV EMUL
INTRAVENOUS | Status: DC | PRN
  Administered 2015-08-19: 100 ug/kg/min via INTRAVENOUS

## 2015-08-19 MED ORDER — PROPOFOL 10 MG/ML IV BOLUS
INTRAVENOUS | Status: AC
  Filled 2015-08-19: qty 40

## 2015-08-19 MED ORDER — GLYCERIN (LAXATIVE) 2.1 G RE SUPP
1.0000 | Freq: Once | RECTAL | Status: AC
  Administered 2015-08-19: 1 via RECTAL
  Filled 2015-08-19: qty 1

## 2015-08-19 MED ORDER — AMLODIPINE BESYLATE 5 MG PO TABS
5.0000 mg | ORAL_TABLET | Freq: Every day | ORAL | Status: DC
  Administered 2015-08-19 – 2015-08-21 (×3): 5 mg via ORAL
  Filled 2015-08-19 (×3): qty 1

## 2015-08-19 MED ORDER — LACTATED RINGERS IV SOLN
INTRAVENOUS | Status: DC | PRN
  Administered 2015-08-19: 14:00:00 via INTRAVENOUS

## 2015-08-19 MED ORDER — DOCUSATE SODIUM 100 MG PO CAPS
200.0000 mg | ORAL_CAPSULE | Freq: Every day | ORAL | Status: DC
  Administered 2015-08-19 – 2015-08-20 (×2): 200 mg via ORAL
  Filled 2015-08-19 (×2): qty 2

## 2015-08-19 SURGICAL SUPPLY — 15 items

## 2015-08-19 NOTE — Progress Notes (Signed)
     [facility] Progress Note  Subjective:  Vomited yesterday. Tried broth this morning with spoonful of jello--felt very nauseous and had worsening pain. Rec pain meds and says pain now a 4 or 5 on a scale of 1-10.  Lipase today 52.WBC 5.2, hgb 11.1.Says she normally has 2 BM qd. Last BM was Thursday. Had suppository Saturday but says it didn't help.   Objective:  Vital signs in last 24 hours: Temp:  [98 F (36.7 C)-98.1 F (36.7 C)] 98.1 F (36.7 C) (02/27 0500) Pulse Rate:  [53-58] 57 (02/27 0500) Resp:  [16-18] 16 (02/27 0500) BP: (139-154)/(59-96) 149/59 mmHg (02/27 0500) SpO2:  [98 %-100 %] 100 % (02/27 0500) Last BM Date: 08/12/15 General:   Alert,  Well-developed,    in NAD Heart:  Regular rate and rhythm; no murmurs Pulm: lungs clear Abdomen:  Soft,  Nondistended, mild TTP throughout upper abdomen,  Quiet bowel sounds, without guarding, and without rebound.   Extremities:  Without edema. Neurologic:Alert and  oriented x4;  grossly normal neurologically. Psych:  Alert and cooperative. Normal mood and affect.  Intake/Output from previous day: 02/26 0701 - 02/27 0700 In: 355 [P.O.:355] Out: 1500 [Urine:1500] Intake/Output this shift: Total I/O In: -  Out: 400 [Urine:400]  Lab Results:  Recent Labs  08/17/15 0349 08/18/15 0349 08/19/15 0401  WBC 7.8 6.6 5.2  HGB 11.0* 11.3* 11.1*  HCT 34.5* 35.2* 33.9*  PLT 302 318 337     ASSESSMENT/PLAN:  60 yo AA female admitted with abdominal pain of sudden onset suspected to be secondary to acute pancreatitis or possible duodenitis. Lipase only very mildly elevated for pancreatitis and this elevation can be seen in the setting of nausea and vomiting as well so unsure if this is actually pancreatitis. She denies ETOH use and has no sign of cholelithiasis/CBD stones and LFT's are normal. Was on HCTZ, which is on hold and can cause pancreatitis. Continue PPI .Will plan on EGD to r/o duodenitis/ulcer--will  check with attending as to timing.  Principal Problem:   Acute pancreatitis Active Problems:   Abdominal pain   Diabetes mellitus ([place])   Essential hypertension, benign   UTI (lower urinary tract infection)   Pancreatitis, acute   Abnormal CT of the abdomen     LOS: 6 days   Hvozdovic, Vita Barley PA-C 08/19/2015, Pager [phone] Mon-Fri 8a-5p [phone] after 5p, weekends, holidays  GI ATTENDING  Interval history data reviewed. Patient seen and examined. Case discussed with Dr. Havery Moros. Recurrent pain. Plan upper endoscopy today given findings on CT to rule out duodenal abnormality.The nature of the procedure, as well as the risks, benefits, and alternatives were carefully and thoroughly reviewed with the patient. Ample time for discussion and questions allowed. The patient understood, was satisfied, and agreed to proceed.. May need repeat imaging if negative.  Docia Chuck. Geri Seminole., M.D. [facility] Division of Gastroenterology

## 2015-08-19 NOTE — Anesthesia Preprocedure Evaluation (Signed)
Anesthesia Evaluation  Patient identified by MRN, date of birth, ID band Patient awake    Reviewed: Allergy & Precautions, NPO status , Patient's Chart, lab work & pertinent test results, Unable to perform ROS - Chart review only  History of Anesthesia Complications Negative for: history of anesthetic complications  Airway Mallampati: II  TM Distance: >3 FB Neck ROM: Full    Dental  (+) Dental Advisory Given, Missing   Pulmonary shortness of breath, asthma , neg sleep apnea, neg recent URI, former smoker, neg PE   breath sounds clear to auscultation       Cardiovascular hypertension, Pt. on medications (-) angina(-) Past MI and (-) CHF  Rhythm:Regular     Neuro/Psych negative neurological ROS  negative psych ROS   GI/Hepatic GERD  ,  Endo/Other  diabetes, Type 2  Renal/GU negative Renal ROS     Musculoskeletal  (+) Arthritis ,   Abdominal   Peds  Hematology  (+) anemia ,   Anesthesia Other Findings   Reproductive/Obstetrics                             Anesthesia Physical Anesthesia Plan  ASA: III  Anesthesia Plan: MAC   Post-op Pain Management:    Induction: Intravenous  Airway Management Planned: Simple Face Mask, Natural Airway and Nasal Cannula  Additional Equipment:   Intra-op Plan:   Post-operative Plan:   Informed Consent: I have reviewed the patients History and Physical, chart, labs and discussed the procedure including the risks, benefits and alternatives for the proposed anesthesia with the patient or authorized representative who has indicated his/her understanding and acceptance.   Dental advisory given  Plan Discussed with: CRNA and Surgeon  Anesthesia Plan Comments:         Anesthesia Quick Evaluation

## 2015-08-19 NOTE — Op Note (Signed)
[facility] [place], [address]   ENDOSCOPY PROCEDURE REPORT  PATIENT: [name], [name]  MR#: [id number] BIRTHDATE: 01/20/1956 , 59  yrs. old GENDER: female ENDOSCOPIST: Eustace Quail, MD REFERRED BY:  [facility] PROCEDURE DATE:  08/19/2015 PROCEDURE:  EGD, diagnostic ASA CLASS:     Class II INDICATIONS:  epigastric pain.. Abnormal CT with minimal inflammation in the region of thepost bulbar duodenum and pancreas with mild elevation of lipase MEDICATIONS: Monitored anesthesia care and Per Anesthesia TOPICAL ANESTHETIC: none  DESCRIPTION OF PROCEDURE: After the risks benefits and alternatives of the procedure were thoroughly explained, informed consent was obtained.  The Pentax Gastroscope [id number] endoscope was introduced through the mouth and advanced to the second portion of the duodenum , Without limitations.  The instrument was slowly withdrawn as the mucosa was fully examined.  EXAM: The esophagus and gastroesophageal junction were completely normal in appearance.  The stomach was entered and closely examined.The antrum, angularis, and lesser curvature were well visualized, including a retroflexed view of the cardia and fundus. The stomach wall was normally distensable.  The scope passed easily through the pylorus into the duodenum. The duodenum was normal to the fourth portion. No inflammation, diverticula, or other abnormality.  Retroflexed views revealed no abnormalities.     The scope was then withdrawn from the patient and the procedure completed.  COMPLICATIONS: There were no immediate complications.  ENDOSCOPIC IMPRESSION: 1. Normal EGD withdeep duodenal intubation  RECOMMENDATIONS: 1.  Diet as tolerated 2.  Should pain persist recommend repeat contrast-enhanced CT scan of theabdomen to assess for interval change  REPEAT EXAM:  eSigned:  Eustace Quail, MD 08/19/2015 2:35 PM    CC:The Patient

## 2015-08-19 NOTE — Progress Notes (Signed)
[facility] Progress Note   [name]  [id number]  DOB: 23-May-1956  DOA: 08/13/2015 PCP: Gara Kroner, MD  Brief narrative: [name] is a 60 y.o. female with hypertension, type 2 diabetes mellitus, GLA deficiency presents to the ER for mid abdominal pain associated with nausea for 4-5 days. She has not had any vomiting, fevers or diarrhea. Pain feels like an ache and is nearly constant. She also complains of mid back pain which started after her abdominal pain. She was noted to have mildly elevated lipase and a CT suggestive of possible pancreatitis and duodenitis and was therefore admitted.   Subjective: She had abdominal cramping when trying to drink clear liquids this AM. Nausea slightly better. Still no vomiting. No BMs.   Assessment/Plan: Principal Problem:   Abdominal pain suspected to be secondary to Acute pancreatitis and possible duodenitis -Lipase has normalized but due to the fact that she had pain with full liquids yesterday, she is back down to clear liquids now-as she is not improving, I have asked for a GI consult - She was on HCTZ which is now on hold as it could've caused her pancreatitis -Lipid panel does not reveal hypertriglyceridemia -CT scan does not reveal cholelithiasis -Have started IV Protonix as well for possible duodenitis - I have had her taking Senna to prevent constipation from narcotics but this may be causing cramping therefore will switch, to Colace daily    Active Problems:    Diabetes mellitus  -Oral medication on hold and on sliding scale insulin for now  Back pain -On exam, this appears to be musculoskeletal with increase tenderness, pain on palpation- improving   Hypokalemia -Replaced via IV and improved    Hypotension with a history of Essential hypertension -initially held amlodipine, losartan/HCTZ as BP was low -can resume Amlodipine today as BP rising    UTI (lower urinary tract infection) -She does admit to  having increased frequency of micturition prior to admission-UA is positive--urine culture growing K Pneumoniae- has completed a 3 day course of Rocephin    Antibiotics: Anti-infectives    Start     Dose/Rate Route Frequency Ordered Stop   08/14/15 2200  cefTRIAXone (ROCEPHIN) 1 g in dextrose 5 % 50 mL IVPB  Status:  Discontinued     1 g 100 mL/hr over 30 Minutes Intravenous Every 24 hours 08/14/15 0213 08/16/15 1505   08/13/15 2215  cefTRIAXone (ROCEPHIN) 1 g in dextrose 5 % 50 mL IVPB     1 g 100 mL/hr over 30 Minutes Intravenous  Once 08/13/15 2212 08/13/15 2257     Code Status:     Code Status Orders        Start     Ordered   08/14/15 0214  Full code   Continuous     08/14/15 0213    Code Status History    Date Active Date Inactive Code Status Order ID Comments User Context   12/12/2013  7:58 PM 12/15/2013  1:07 PM Full Code [id number]  Mcarthur Rossetti, MD Inpatient   08/24/2013  5:03 PM 08/29/2013  4:50 PM Full Code [id number]  Saverio Danker, PA-C Inpatient   08/19/2013  3:06 PM 08/24/2013  5:03 PM Full Code [id number]  Domenic Polite, MD Inpatient     Family Communication:  Disposition Plan: Continue to follow on MedSurg unit-home in 2-3 days hopefully DVT prophylaxis: Lovenox Consultants: None Procedures: None    Objective: Filed Weights   08/14/15 0130 08/17/15 0700  Weight: 88.1  kg (194 lb 3.6 oz) 90.8 kg (200 lb 2.8 oz)    Intake/Output Summary (Last 24 hours) at 08/19/15 1216 Last data filed at 08/19/15 0848  Gross per 24 hour  Intake    355 ml  Output   1900 ml  Net  -1545 ml     Vitals Filed Vitals:   08/18/15 0528 08/18/15 1419 08/18/15 2036 08/19/15 0500  BP: 137/68 139/96 154/79 149/59  Pulse: 65 53 58 57  Temp: 98 F (36.7 C) 98 F (36.7 C) 98 F (36.7 C) 98.1 F (36.7 C)  TempSrc: Oral Oral Oral Oral  Resp: 16 18 18 16   Height:      Weight:      SpO2: 95% 99% 98% 100%    Exam:  General:  Pt is alert, not in acute  distress  HEENT: No icterus, No thrush, oral mucosa moist  Cardiovascular: regular rate and rhythm, S1/S2 No murmur  Respiratory: clear to auscultation bilaterally   Abdomen: Soft, +Bowel sounds, very mild mid abdominal tenderness, no rebound tenderness, non distended, no guarding  MSK: No cyanosis or clubbing- no pedal edema   Data Reviewed: Basic Metabolic Panel:  Recent Labs Lab 08/13/15 1456 08/14/15 0417 08/15/15 0403 08/16/15 0353  NA 137 138 141 142  K 3.6 3.3* 3.9 4.0  CL 99* 102 107 110  CO2 29 25 23 22   GLUCOSE 128* 126* 91 91  BUN 15 14 11 7   CREATININE 0.82 0.86 0.85 0.86  CALCIUM 9.9 8.8* 8.8* 8.5*  MG  --  1.7  --   --   PHOS  --  3.8  --   --    Liver Function Tests:  Recent Labs Lab 08/13/15 1456 08/14/15 0417 08/15/15 0403 08/16/15 0353  AST 13* 12* 13* 12*  ALT 14 14 12* 13*  ALKPHOS 79 68 67 62  BILITOT 0.6 0.7 0.5 0.6  PROT 7.4 6.5 6.7 6.6  ALBUMIN 4.1 3.5 3.6 3.3*    Recent Labs Lab 08/15/15 0403 08/16/15 0353 08/17/15 0349 08/18/15 0349 08/19/15 0401  LIPASE 110* 62* 56* 48 52*   No results for input(s): AMMONIA in the last 168 hours. CBC:  Recent Labs Lab 08/15/15 0403 08/16/15 0353 08/17/15 0349 08/18/15 0349 08/19/15 0401  WBC 9.8 9.5 7.8 6.6 5.2  NEUTROABS 6.8 7.1 5.5 4.3 3.1  HGB 11.6* 11.5* 11.0* 11.3* 11.1*  HCT 35.9* 35.9* 34.5* 35.2* 33.9*  MCV 89.5 90.0 90.1 90.3 89.7  PLT 294 294 302 318 337   Cardiac Enzymes: No results for input(s): CKTOTAL, CKMB, CKMBINDEX, TROPONINI in the last 168 hours. BNP (last 3 results) No results for input(s): BNP in the last 8760 hours.  ProBNP (last 3 results) No results for input(s): PROBNP in the last 8760 hours.  CBG:  Recent Labs Lab 08/17/15 2143 08/18/15 0740 08/18/15 1214 08/18/15 1720 08/19/15 0842  GLUCAP 110* 90 109* 81 110*    Recent Results (from the past 240 hour(s))  Urine culture     Status: None   Collection Time: 08/13/15  8:30 PM  Result  Value Ref Range Status   Specimen Description URINE, CLEAN CATCH  Final   Special Requests Normal  Final   Culture   Final    >=100,000 COLONIES/mL KLEBSIELLA PNEUMONIAE Performed at [facility]    Report Status 08/16/2015 FINAL  Final   Organism ID, Bacteria KLEBSIELLA PNEUMONIAE  Final      Susceptibility   Klebsiella pneumoniae - MIC*  AMPICILLIN >=32 RESISTANT Resistant     CEFAZOLIN <=4 SENSITIVE Sensitive     CEFTRIAXONE <=1 SENSITIVE Sensitive     CIPROFLOXACIN <=0.25 SENSITIVE Sensitive     GENTAMICIN <=1 SENSITIVE Sensitive     IMIPENEM <=0.25 SENSITIVE Sensitive     NITROFURANTOIN 32 SENSITIVE Sensitive     TRIMETH/SULFA <=20 SENSITIVE Sensitive     AMPICILLIN/SULBACTAM >=32 RESISTANT Resistant     PIP/TAZO 16 SENSITIVE Sensitive     * >=100,000 COLONIES/mL KLEBSIELLA PNEUMONIAE     Studies: No results found.  Scheduled Meds:  Scheduled Meds: . budesonide-formoterol  2 puff Inhalation BID  . enoxaparin (LOVENOX) injection  40 mg Subcutaneous Q24H  . Glycerin (Adult)  1 suppository Rectal Once  . insulin aspart  0-9 Units Subcutaneous TID WC  . latanoprost  1 drop Both Eyes QHS  . pantoprazole (PROTONIX) IV  40 mg Intravenous Q12H  . senna  2 tablet Oral Daily   Continuous Infusions: . 0.9 % NaCl with KCl 20 mEq / L 50 mL/hr at 08/18/15 2132    Time spent on care of this patient: 93 min   Esko, MD 08/19/2015, 12:16 PM  LOS: 6 days   [facility] Office  [phone] Pager - Text Page per [email or web address] If 7PM-7AM, please contact night-coverage [email or web address]

## 2015-08-19 NOTE — Transfer of Care (Signed)
Immediate Anesthesia Transfer of Care Note  Patient: [name]  Procedure(s) Performed: Procedure(s): ESOPHAGOGASTRODUODENOSCOPY (EGD) WITH PROPOFOL (N/A)  Patient Location: PACU  Anesthesia Type:General  Level of Consciousness:  sedated, patient cooperative and responds to stimulation  Airway & Oxygen Therapy:Patient Spontanous Breathing and Patient connected to face mask oxgen  Post-op Assessment:  Report given to PACU RN and Post -op Vital signs reviewed and stable  Post vital signs:  Reviewed and stable  Last Vitals:  Filed Vitals:   08/19/15 1348 08/19/15 1355  BP:  204/82  Pulse:  58  Temp: 36.7 C 36.7 C  Resp:  16    Complications: No apparent anesthesia complications

## 2015-08-20 ENCOUNTER — Encounter (HOSPITAL_COMMUNITY): Payer: Self-pay | Admitting: Internal Medicine

## 2015-08-20 LAB — LIPASE, BLOOD: Lipase: 61 U/L — ABNORMAL HIGH (ref 11–51)

## 2015-08-20 LAB — CBC WITH DIFFERENTIAL/PLATELET
Basophils Absolute: 0 10*3/uL (ref 0.0–0.1)
Basophils Relative: 0 %
Eosinophils Absolute: 0.1 10*3/uL (ref 0.0–0.7)
Eosinophils Relative: 3 %
HCT: 34.1 % — ABNORMAL LOW (ref 36.0–46.0)
Hemoglobin: 11.1 g/dL — ABNORMAL LOW (ref 12.0–15.0)
Lymphocytes Relative: 34 %
Lymphs Abs: 1.9 10*3/uL (ref 0.7–4.0)
MCH: 28.2 pg (ref 26.0–34.0)
MCHC: 32.6 g/dL (ref 30.0–36.0)
MCV: 86.8 fL (ref 78.0–100.0)
Monocytes Absolute: 0.7 10*3/uL (ref 0.1–1.0)
Monocytes Relative: 13 %
Neutro Abs: 2.9 10*3/uL (ref 1.7–7.7)
Neutrophils Relative %: 50 %
Platelets: 340 10*3/uL (ref 150–400)
RBC: 3.93 MIL/uL (ref 3.87–5.11)
RDW: 13.8 % (ref 11.5–15.5)
WBC: 5.7 10*3/uL (ref 4.0–10.5)

## 2015-08-20 LAB — GLUCOSE, CAPILLARY
Glucose-Capillary: 148 mg/dL — ABNORMAL HIGH (ref 65–99)
Glucose-Capillary: 132 mg/dL — ABNORMAL HIGH (ref 65–99)
Glucose-Capillary: 148 mg/dL — ABNORMAL HIGH (ref 65–99)
Glucose-Capillary: 155 mg/dL — ABNORMAL HIGH (ref 65–99)
Glucose-Capillary: 184 mg/dL — ABNORMAL HIGH (ref 65–99)

## 2015-08-20 MED ORDER — ACETAMINOPHEN 325 MG PO TABS: 650.0000 mg | ORAL_TABLET | ORAL | Status: DC | PRN

## 2015-08-20 MED ORDER — LOSARTAN POTASSIUM 50 MG PO TABS
100.0000 mg | ORAL_TABLET | Freq: Every day | ORAL | Status: DC
  Administered 2015-08-20 – 2015-08-21 (×2): 100 mg via ORAL
  Filled 2015-08-20 (×2): qty 2

## 2015-08-20 MED ORDER — PANTOPRAZOLE SODIUM 40 MG PO TBEC: 40.0000 mg | DELAYED_RELEASE_TABLET | Freq: Two times a day (BID) | ORAL | Status: DC

## 2015-08-20 MED ORDER — HYDROMORPHONE HCL 1 MG/ML IJ SOLN
0.5000 mg | INTRAMUSCULAR | Status: DC | PRN
  Administered 2015-08-20 – 2015-08-21 (×5): 0.5 mg via INTRAVENOUS
  Filled 2015-08-20 (×6): qty 1

## 2015-08-20 NOTE — Progress Notes (Signed)
The patient is receiving Protonix by the intravenous route.  Based on criteria approved by the Pharmacy and [place], the medication is being converted to the equivalent oral dose form.  These criteria include: -No Active GI bleeding -Able to tolerate diet of full liquids (or better) or tube feeding -Able to tolerate other medications by the oral or enteral route  If you have any questions about this conversion, please contact the Pharmacy Department (phone [phone]).  Thank you.  Minda Ditto PharmD Pager [phone] 08/20/2015, 12:11 PM

## 2015-08-20 NOTE — Progress Notes (Signed)
[facility] Progress Note   AVARY BESSIRE  [id number]  DOB: [birth year]  DOA: 08/13/2015 PCP: Gara Kroner, MD  Brief narrative: [name] is a 60 y.o. female with hypertension, type 2 diabetes mellitus, GLA deficiency presents to the ER for mid abdominal pain associated with nausea for 4-5 days. She has not had any vomiting, fevers or diarrhea. Pain feels like an ache and is nearly constant. She also complains of mid back pain which started after her abdominal pain. She was noted to have mildly elevated lipase and a CT suggestive of possible pancreatitis and duodenitis and was therefore admitted.   Subjective: Tolerating soft/ solid diet today.  No vomiting. No pain after eating.   Assessment/Plan: Principal Problem:   Abdominal pain suspected to be secondary to Acute pancreatitis and possible duodenitis - She was on HCTZ which is now on hold as it could've caused her pancreatitis -- placed on IV Protonix BIDfor possible duodenitis -Lipid panel does not reveal hypertriglyceridemia -CT scan does not reveal cholelithiasis -Lipase eventually normalized but due to the fact that she had increased pain when advanced to full liquids on 2/26, she was cut back down to clear liquids and GI was called to evaluate for possible ulcer - EGD performed on 2/27 did not reveal any abnormalities - as EGD showed a normal Duodenum, will d/c Protonix - monitor one more day on solid food while weaning off of Dilaudid of which she has had large quantities over the past 6-7 days- she has received > 10 mg in past 24 hours and suddenly stopping it may cause withdrawal- have dropped dose to 0.5 mg- she is in agreement with cutting back on narcotics with the hopes of going home tomorrow  Active Problems:    Diabetes mellitus  -Oral medication on hold and on sliding scale insulin for now  Back pain -On exam, this appears to be musculoskeletal with increase tenderness, pain on palpation-  improving   Hypokalemia -Replaced via IV and improved    Hypotension with a history of Essential hypertension -initially held amlodipine, losartan/HCTZ as BP was low -resumed Amlodipine- will resume Losartan today - keep off of HCTZ  UTI (lower urinary tract infection) -She does admit to having increased frequency of micturition prior to admission-UA is positive--urine culture growing K Pneumoniae- has completed a 3 day course of Rocephin  Antibiotics: Anti-infectives    Start     Dose/Rate Route Frequency Ordered Stop   08/14/15 2200  cefTRIAXone (ROCEPHIN) 1 g in dextrose 5 % 50 mL IVPB  Status:  Discontinued     1 g 100 mL/hr over 30 Minutes Intravenous Every 24 hours 08/14/15 0213 08/16/15 1505   08/13/15 2215  cefTRIAXone (ROCEPHIN) 1 g in dextrose 5 % 50 mL IVPB     1 g 100 mL/hr over 30 Minutes Intravenous  Once 08/13/15 2212 08/13/15 2257     Code Status:     Code Status Orders        Start     Ordered   08/14/15 0214  Full code   Continuous     08/14/15 0213    Code Status History    Date Active Date Inactive Code Status Order ID Comments User Context   12/12/2013  7:58 PM 12/15/2013  1:07 PM Full Code [id number]  Mcarthur Rossetti, MD Inpatient   08/24/2013  5:03 PM 08/29/2013  4:50 PM Full Code [id number]  Saverio Danker, PA-C Inpatient   08/19/2013  3:06 PM 08/24/2013  5:03 PM Full Code [id number]  Domenic Polite, MD Inpatient     Family Communication:  Disposition Plan: Continue to follow on MedSurg unit-home in 2-3 days hopefully DVT prophylaxis: Lovenox Consultants: None Procedures: None    Objective: Filed Weights   08/14/15 0130 08/17/15 0700 08/19/15 1355  Weight: 88.1 kg (194 lb 3.6 oz) 90.8 kg (200 lb 2.8 oz) 90.719 kg (200 lb)    Intake/Output Summary (Last 24 hours) at 08/20/15 1719 Last data filed at 08/20/15 1417  Gross per 24 hour  Intake   1260 ml  Output   1400 ml  Net   -140 ml     Vitals Filed Vitals:   08/20/15 0936 08/20/15  1000 08/20/15 1437 08/20/15 1450  BP:  151/81 167/84 152/90  Pulse: 62  61   Temp:   98.8 F (37.1 C)   TempSrc:   Oral   Resp: 18  18   Height:      Weight:      SpO2: 100%  98%     Exam:  General:  Pt is alert, not in acute distress  HEENT: No icterus, No thrush, oral mucosa moist  Cardiovascular: regular rate and rhythm, S1/S2 No murmur  Respiratory: clear to auscultation bilaterally   Abdomen: Soft, +Bowel sounds, very mild mid abdominal tenderness, no rebound tenderness, non distended, no guarding  MSK: No cyanosis or clubbing- no pedal edema   Data Reviewed: Basic Metabolic Panel:  Recent Labs Lab 08/14/15 0417 08/15/15 0403 08/16/15 0353  NA 138 141 142  K 3.3* 3.9 4.0  CL 102 107 110  CO2 25 23 22   GLUCOSE 126* 91 91  BUN 14 11 7   CREATININE 0.86 0.85 0.86  CALCIUM 8.8* 8.8* 8.5*  MG 1.7  --   --   PHOS 3.8  --   --    Liver Function Tests:  Recent Labs Lab 08/14/15 0417 08/15/15 0403 08/16/15 0353  AST 12* 13* 12*  ALT 14 12* 13*  ALKPHOS 68 67 62  BILITOT 0.7 0.5 0.6  PROT 6.5 6.7 6.6  ALBUMIN 3.5 3.6 3.3*    Recent Labs Lab 08/16/15 0353 08/17/15 0349 08/18/15 0349 08/19/15 0401 08/20/15 0330  LIPASE 62* 56* 48 52* 61*   No results for input(s): AMMONIA in the last 168 hours. CBC:  Recent Labs Lab 08/16/15 0353 08/17/15 0349 08/18/15 0349 08/19/15 0401 08/20/15 0330  WBC 9.5 7.8 6.6 5.2 5.7  NEUTROABS 7.1 5.5 4.3 3.1 2.9  HGB 11.5* 11.0* 11.3* 11.1* 11.1*  HCT 35.9* 34.5* 35.2* 33.9* 34.1*  MCV 90.0 90.1 90.3 89.7 86.8  PLT 294 302 318 337 340   Cardiac Enzymes: No results for input(s): CKTOTAL, CKMB, CKMBINDEX, TROPONINI in the last 168 hours. BNP (last 3 results) No results for input(s): BNP in the last 8760 hours.  ProBNP (last 3 results) No results for input(s): PROBNP in the last 8760 hours.  CBG:  Recent Labs Lab 08/19/15 1248 08/19/15 1731 08/19/15 2242 08/20/15 0741 08/20/15 1205  GLUCAP 103*  106* 148* 132* 148*    Recent Results (from the past 240 hour(s))  Urine culture     Status: None   Collection Time: 08/13/15  8:30 PM  Result Value Ref Range Status   Specimen Description URINE, CLEAN CATCH  Final   Special Requests Normal  Final   Culture   Final    >=100,000 COLONIES/mL KLEBSIELLA PNEUMONIAE Performed at [facility]    Report Status 08/16/2015 FINAL  Final  Organism ID, Bacteria KLEBSIELLA PNEUMONIAE  Final      Susceptibility   Klebsiella pneumoniae - MIC*    AMPICILLIN >=32 RESISTANT Resistant     CEFAZOLIN <=4 SENSITIVE Sensitive     CEFTRIAXONE <=1 SENSITIVE Sensitive     CIPROFLOXACIN <=0.25 SENSITIVE Sensitive     GENTAMICIN <=1 SENSITIVE Sensitive     IMIPENEM <=0.25 SENSITIVE Sensitive     NITROFURANTOIN 32 SENSITIVE Sensitive     TRIMETH/SULFA <=20 SENSITIVE Sensitive     AMPICILLIN/SULBACTAM >=32 RESISTANT Resistant     PIP/TAZO 16 SENSITIVE Sensitive     * >=100,000 COLONIES/mL KLEBSIELLA PNEUMONIAE     Studies: No results found.  Scheduled Meds:  Scheduled Meds: . amLODipine  5 mg Oral Daily  . budesonide-formoterol  2 puff Inhalation BID  . docusate sodium  200 mg Oral QHS  . enoxaparin (LOVENOX) injection  40 mg Subcutaneous Q24H  . insulin aspart  0-9 Units Subcutaneous TID WC  . latanoprost  1 drop Both Eyes QHS  . pantoprazole  40 mg Oral BID  . protein supplement shake  8 oz Oral BID BM   Continuous Infusions: . 0.9 % NaCl with KCl 20 mEq / L 1,000 mL (08/20/15 1313)    Time spent on care of this patient: 42 min   [place], MD 08/20/2015, 5:19 PM  LOS: 7 days   [facility] Office  [phone] Pager - Text Page per [email or web address] If 7PM-7AM, please contact night-coverage [email or web address]

## 2015-08-20 NOTE — Progress Notes (Signed)
     [facility] Progress Note  Subjective:  Says that she is feeling better today.  About 50% improved; pain 5/10 without pain medication compared to 10/10 when pain started and she came in.  Had a BM today.  Some nausea/vomiting yesterday afternoon, but tolerated soft diet last night and this AM.  Objective:  Vital signs in last 24 hours: Temp:  [98.1 F (36.7 C)-98.5 F (36.9 C)] 98.5 F (36.9 C) (02/28 0544) Pulse Rate:  [55-63] 57 (02/28 0544) Resp:  [16-18] 18 (02/28 0544) BP: (115-204)/(63-85) 157/85 mmHg (02/28 0544) SpO2:  [99 %-100 %] 99 % (02/28 0544) Weight:  [200 lb (90.719 kg)] 200 lb (90.719 kg) (02/27 1355) Last BM Date: 08/19/15 General:  Alert, Well-developed, in NAD Heart:  Slightly bradycardic; no murmurs Pulm:  CTAB.  No W/R/R. Abdomen:  Soft, non-distended. Normal bowel sounds.  Mild TTP in mid-abdomen.     Extremities:  Without edema. Neurologic:  Alert and oriented x 4;  grossly normal neurologically. Psych:  Alert and cooperative. Normal mood and affect.  Intake/Output from previous day: 02/27 0701 - 02/28 0700 In: 720 [P.O.:620; I.V.:100] Out: 1500 [Urine:1500]  Lab Results:  Recent Labs  08/18/15 0349 08/19/15 0401 08/20/15 0330  WBC 6.6 5.2 5.7  HGB 11.3* 11.1* 11.1*  HCT 35.2* 33.9* 34.1*  PLT 318 337 340   Assessment / Plan: *60 yo AA female admitted with abdominal pain of sudden onset suspected to be secondary to acute pancreatitis or possible duodenitis. Lipase only very mildly elevated for pancreatitis and this elevation can be seen in the setting of nausea and vomiting as well so unsure if this is actually pancreatitis. She denies ETOH use and has no sign of cholelithiasis/CBD stones and LFT's are normal. Was on HCTZ, which is on hold and can cause pancreatitis. Continue PPI.  EGD normal into the fourth portion of duodenum; no ulcer or duodenitis identified so possibly she had mild pancreatitis.  Patient improving.  No  need to repeat CT scan at this time.       **GI signing off.  Call with questions.   LOS: 7 days   ZEHR, JESSICA D.  08/20/2015, 9:02 AM  Pager number [phone]   GI ATTENDING  Interval history data reviewed. Patient seen and examined. Agree with interval progress note as outlined above. Patient stable. Benign abdomen. Hopefully continues to improve on PPI and off hydrochlorothiazide. Endoscopy completely normal. No new recommendations or plans. Will sign off.  Docia Chuck. Geri [place]in[place]., M.D. [facility] Division of Gastroenterology

## 2015-08-20 NOTE — Plan of Care (Signed)
Problem: Activity: Goal: Risk for activity intolerance will decrease Outcome: Progressing Patient ambulates to the BR. Out of bed in the recliner chair.

## 2015-08-21 ENCOUNTER — Encounter (HOSPITAL_COMMUNITY): Payer: Self-pay | Admitting: Internal Medicine

## 2015-08-21 LAB — CBC WITH DIFFERENTIAL/PLATELET
Basophils Absolute: 0 10*3/uL (ref 0.0–0.1)
Basophils Relative: 0 %
Eosinophils Absolute: 0.1 10*3/uL (ref 0.0–0.7)
Eosinophils Relative: 2 %
HCT: 35.5 % — ABNORMAL LOW (ref 36.0–46.0)
Hemoglobin: 11.9 g/dL — ABNORMAL LOW (ref 12.0–15.0)
Lymphocytes Relative: 34 %
Lymphs Abs: 1.9 10*3/uL (ref 0.7–4.0)
MCH: 28.9 pg (ref 26.0–34.0)
MCHC: 33.5 g/dL (ref 30.0–36.0)
MCV: 86.2 fL (ref 78.0–100.0)
Monocytes Absolute: 0.6 10*3/uL (ref 0.1–1.0)
Monocytes Relative: 11 %
Neutro Abs: 2.9 10*3/uL (ref 1.7–7.7)
Neutrophils Relative %: 53 %
Platelets: 376 10*3/uL (ref 150–400)
RBC: 4.12 MIL/uL (ref 3.87–5.11)
RDW: 13.9 % (ref 11.5–15.5)
WBC: 5.5 10*3/uL (ref 4.0–10.5)

## 2015-08-21 LAB — CREATININE, SERUM
Creatinine, Ser: 0.77 mg/dL (ref 0.44–1.00)
GFR calc Af Amer: >60 mL/min (ref >60)
GFR calc non Af Amer: >60 mL/min (ref >60)

## 2015-08-21 LAB — GLUCOSE, CAPILLARY: Glucose-Capillary: 153 mg/dL — ABNORMAL HIGH (ref 65–99)

## 2015-08-21 LAB — LIPASE, BLOOD: Lipase: 71 U/L — ABNORMAL HIGH (ref 11–51)

## 2015-08-21 MED ORDER — LOSARTAN POTASSIUM 100 MG PO TABS
100.0000 mg | ORAL_TABLET | Freq: Every day | ORAL | Status: DC
Start: 2015-08-21 — End: 2018-06-23

## 2015-08-21 MED ORDER — AMLODIPINE BESYLATE 5 MG PO TABS: 5.0000 mg | ORAL_TABLET | Freq: Every day | ORAL | Status: DC

## 2015-08-21 MED ORDER — DOCUSATE SODIUM 100 MG PO CAPS
200.0000 mg | ORAL_CAPSULE | Freq: Two times a day (BID) | ORAL | Status: DC
  Administered 2015-08-21: 200 mg via ORAL
  Filled 2015-08-21: qty 2

## 2015-08-21 MED ORDER — OXYCODONE-ACETAMINOPHEN 5-325 MG PO TABS: 1.0000 | ORAL_TABLET | Freq: Four times a day (QID) | ORAL | Status: DC | PRN

## 2015-08-21 NOTE — Care Management Note (Signed)
Case Management Note  Patient Details  Name: [name] MRN: [id number] Date of Birth: August 20, 1955  Subjective/Objective:                    Action/Plan:d/c home no needs or orders.   Expected Discharge Date:                Expected Discharge Plan:  Home/Self Care  In-House Referral:     Discharge planning Services  CM Consult  Post Acute Care Choice:    Choice offered to:     DME Arranged:    DME Agency:     HH Arranged:    [place] Agency:     Status of Service:  Completed, signed off  Medicare Important Message Given:    Date Medicare IM Given:    Medicare IM give by:    Date Additional Medicare IM Given:    Additional Medicare Important Message give by:     If discussed at [place] of Stay Meetings, dates discussed:    Additional Comments:  Dessa Phi, RN 08/21/2015, 10:50 AM

## 2015-08-21 NOTE — Discharge Summary (Signed)
Physician Discharge Summary  [name] [id number] DOB: Feb 20, 1956 DOA: 08/13/2015  PCP: Renee Kroner, MD  Admit date: 08/13/2015 Discharge date: 08/21/2015  Recommendations for Outpatient Follow-up:  1. Pt will need to follow up with PCP in 2 weeks post discharge 2. Please obtain BMP and CBC in 1-2 weeks  Discharge Diagnoses:   Abdominal pain suspected to be secondary to Acute pancreatitis and possible duodenitis - She was on HCTZ which is now on hold as it could've caused her pancreatitis -- placed on IV Protonix BIDfor possible duodenitis -Lipid panel does not reveal hypertriglyceridemia -CT scan does not reveal cholelithiasis -Lipase eventually normalized but due to the fact that she had increased pain when advanced to full liquids on 2/26, she was cut back down to clear liquids and GI was called to evaluate for possible ulcer - EGD performed on 2/27 did not reveal any abnormalities - as EGD showed a normal Duodenum, will d/c Protonix - monitor one more day on solid food while weaning off of Dilaudid of which she has had large quantities over the past 6-7 days- she has received > 10 mg in past 24 hours and suddenly stopping it may cause withdrawal- have dropped dose to 0.5 mg- she is in agreement with cutting back on narcotics with the hopes of going  -pt pain overall stable improved and tolerating diet without emesis -home with percocet 5/325mg , #15, one q 6 hrs prn pain -minimally elevated lipase of uncertain clinical significance at this point    Diabetes mellitus  -Oral medication on hold and on sliding scale insulin for now -restart oral metformin and amaryl after d/c  Back pain -On exam, this appears to be musculoskeletal with increase tenderness, pain on palpation- improving   Hypokalemia -Replaced via IV and improved   Hypotension with a history of Essential hypertension -initially held amlodipine, losartan/HCTZ as BP was low -resumed Amlodipine- will  resume Losartan  - keep off of HCTZ -home with amlodipine 5 mg daily and losartan  UTI (lower urinary tract infection) -She does admit to having increased frequency of micturition prior to admission-UA is positive--urine culture growing K Pneumoniae- has completed a 3 day course of Rocephin -no further dysuruia  Discharge Condition: stable  Disposition: home  Diet:soft Wt Readings from Last 3 Encounters:  08/19/15 90.719 kg (200 lb)  06/21/15 96.163 kg (212 lb)  01/17/15 101.152 kg (223 lb)    History of present illness:  [name] is a 59 y.o. female with hypertension, type 2 diabetes mellitus, GLA deficiency presents to the ER for mid abdominal pain associated with nausea for 4-5 days. She has not had any vomiting, fevers or diarrhea. Pain feels like an ache and is nearly constant. She also complains of mid back pain which started after her abdominal pain. She was noted to have mildly elevated lipase and a CT suggestive of possible pancreatitis and duodenitis and was therefore admitted.  Consultants: [facility]  Discharge Exam: Filed Vitals:   08/20/15 2135 08/21/15 0518  BP: 130/73 158/82  Pulse: 65 60  Temp: 98.8 F (37.1 C) 98.6 F (37 C)  Resp: 18 18   Filed Vitals:   08/20/15 1437 08/20/15 1450 08/20/15 2135 08/21/15 0518  BP: 167/84 152/90 130/73 158/82  Pulse: 61  65 60  Temp: 98.8 F (37.1 C)  98.8 F (37.1 C) 98.6 F (37 C)  TempSrc: Oral  Oral Oral  Resp: 18  18 18   Height:      Weight:  SpO2: 98%  98% 100%   General: A&O x 3, NAD, pleasant, cooperative Cardiovascular: RRR, no rub, no gallop, no S3 Respiratory: CTAB, no wheeze, no rhonchi Abdomen:soft, nontender, nondistended, positive bowel sounds Extremities: No edema, No lymphangitis, no petechiae  Discharge Instructions      Discharge Instructions    Diet - low sodium heart healthy    Complete by:  As directed      Increase activity slowly    Complete by:  As directed               Medication List    STOP taking these medications        azithromycin 250 MG tablet  Commonly known as:  ZITHROMAX     benzonatate 200 MG capsule  Commonly known as:  TESSALON     HYDROcodone-acetaminophen 5-325 MG tablet  Commonly known as:  NORCO     losartan-hydrochlorothiazide 100-25 MG tablet  Commonly known as:  HYZAAR      TAKE these medications        albuterol 108 (90 Base) MCG/ACT inhaler  Commonly known as:  PROVENTIL HFA;VENTOLIN HFA  Inhale 2 puffs into the lungs every 6 (six) hours as needed for wheezing or shortness of breath.     albuterol (2.5 MG/3ML) 0.083% nebulizer solution  Commonly known as:  PROVENTIL  Take 2.5 mg by nebulization every 6 (six) hours as needed.     amLODipine 5 MG tablet  Commonly known as:  NORVASC  Take 1 tablet (5 mg total) by mouth daily.     aspirin 81 MG tablet  Take 81 mg by mouth daily.     bimatoprost 0.03 % ophthalmic solution  Commonly known as:  LUMIGAN  Place 1 drop into both eyes at bedtime.     budesonide-formoterol 80-4.5 MCG/ACT inhaler  Commonly known as:  SYMBICORT  Take 2 puffs first thing in am and then another 2 puffs about 12 hours later.     fluticasone 50 MCG/ACT nasal spray  Commonly known as:  FLONASE  Place 1 spray into both nostrils daily as needed for allergies.     glimepiride 4 MG tablet  Commonly known as:  AMARYL  Take 4 mg by mouth daily with breakfast.     losartan 100 MG tablet  Commonly known as:  COZAAR  Take 1 tablet (100 mg total) by mouth daily.     MELATONIN PO  Take 1 tablet by mouth at bedtime as needed (sleep).     metFORMIN 1000 MG tablet  Commonly known as:  GLUCOPHAGE  Take 1,000 mg by mouth 2 (two) times daily with a meal.     omeprazole 40 MG capsule  Commonly known as:  PRILOSEC  Take 40 mg by mouth daily.     oxyCODONE-acetaminophen 5-325 MG tablet  Commonly known as:  PERCOCET/ROXICET  Take 1 tablet by mouth every 6 (six) hours as needed for severe  pain.         The results of significant diagnostics from this hospitalization (including imaging, microbiology, ancillary and laboratory) are listed below for reference.    Significant Diagnostic Studies: Ct Abdomen Pelvis W Contrast  08/13/2015  CLINICAL DATA:  Five day history of diffuse abdominal pain with constipation and nausea. EXAM: CT ABDOMEN AND PELVIS WITH CONTRAST TECHNIQUE: Multidetector CT imaging of the abdomen and pelvis was performed using the standard protocol following bolus administration of intravenous contrast. CONTRAST:  116mL OMNIPAQUE IOHEXOL 300 MG/ML  SOLN COMPARISON:  07/04/2014  FINDINGS: Lower chest:  Unremarkable. Hepatobiliary: Small area of low attenuation in the anterior liver, adjacent to the falciform ligament, is in a characteristic location for focal fatty change. No focal abnormality within the liver parenchyma. There is no evidence for gallstones, gallbladder wall thickening, or pericholecystic fluid. No intrahepatic or extrahepatic biliary dilation. Pancreas: No focal mass lesion. No dilatation of the main duct. No intraparenchymal cyst. There is very subtle edema seen around the pancreatic head/ uncinate process and second/ third portion of the duodenum. Spleen: No splenomegaly. No focal mass lesion. Adrenals/Urinary Tract: No adrenal nodule or mass. Kidneys are normal in appearance bilaterally. No evidence for hydroureter. The urinary bladder appears normal for the degree of distention. Stomach/Bowel: Stomach is nondistended. No gastric wall thickening. No evidence of outlet obstruction. Duodenum is normally positioned as is the ligament of Treitz. No small bowel wall thickening. No small bowel dilatation. The terminal ileum is normal. The appendix is normal. No gross colonic mass. No colonic wall thickening. No substantial diverticular change. Vascular/Lymphatic: There is abdominal aortic atherosclerosis without aneurysm. There is no gastrohepatic or  hepatoduodenal ligament lymphadenopathy. No intraperitoneal or retroperitoneal lymphadenopathy. No pelvic sidewall lymphadenopathy. Reproductive: Uterus is surgically absent. There is no adnexal mass. Other: No intraperitoneal free fluid. Musculoskeletal: Pelvic floor laxity is evident. Evidence of prior ventral mesh placement in the abdomen. Patient is status post right hip replacement. Bone windows reveal no worrisome lytic or sclerotic osseous lesions. IMPRESSION: 1. Very subtle stranding around the inferior pancreatic head/uncinate process and along the medial wall of the distal descending duodenum suggests edema. This is a very subtle finding and was not definitely seen previously, but raises the question of focal pancreatitis or duodenitis. 2. Otherwise unremarkable study. Electronically Signed   By: Misty Stanley M.D.   On: 08/13/2015 21:40   Dg Abd Portable 1v  08/16/2015  CLINICAL DATA:  Abdominal pain and distention with nausea EXAM: PORTABLE ABDOMEN - 1 VIEW COMPARISON:  CT abdomen and pelvis August 13, 2015 FINDINGS: There is contrast in the ascending colon. There is no appreciable bowel dilatation or air-fluid level suggesting obstruction. No free air. Lung bases are clear. There are phleboliths the pelvis. Patient is status post total hip replacement on the right. IMPRESSION: No obstruction or free air evident.  Contrast in ascending colon. Electronically Signed   By: Lowella Grip III M.D.   On: 08/16/2015 10:02     Microbiology: Recent Results (from the past 240 hour(s))  Urine culture     Status: None   Collection Time: 08/13/15  8:30 PM  Result Value Ref Range Status   Specimen Description URINE, CLEAN CATCH  Final   Special Requests Normal  Final   Culture   Final    >=100,000 COLONIES/mL KLEBSIELLA PNEUMONIAE Performed at [facility]    Report Status 08/16/2015 FINAL  Final   Organism ID, Bacteria KLEBSIELLA PNEUMONIAE  Final      Susceptibility   Klebsiella  pneumoniae - MIC*    AMPICILLIN >=32 RESISTANT Resistant     CEFAZOLIN <=4 SENSITIVE Sensitive     CEFTRIAXONE <=1 SENSITIVE Sensitive     CIPROFLOXACIN <=0.25 SENSITIVE Sensitive     GENTAMICIN <=1 SENSITIVE Sensitive     IMIPENEM <=0.25 SENSITIVE Sensitive     NITROFURANTOIN 32 SENSITIVE Sensitive     TRIMETH/SULFA <=20 SENSITIVE Sensitive     AMPICILLIN/SULBACTAM >=32 RESISTANT Resistant     PIP/TAZO 16 SENSITIVE Sensitive     * >=100,000 COLONIES/mL KLEBSIELLA PNEUMONIAE  Labs: Basic Metabolic Panel:  Recent Labs Lab 08/15/15 0403 08/16/15 0353 08/21/15 0332  NA 141 142  --   K 3.9 4.0  --   CL 107 110  --   CO2 23 22  --   GLUCOSE 91 91  --   BUN 11 7  --   CREATININE 0.85 0.86 0.77  CALCIUM 8.8* 8.5*  --    Liver Function Tests:  Recent Labs Lab 08/15/15 0403 08/16/15 0353  AST 13* 12*  ALT 12* 13*  ALKPHOS 67 62  BILITOT 0.5 0.6  PROT 6.7 6.6  ALBUMIN 3.6 3.3*    Recent Labs Lab 08/17/15 0349 08/18/15 0349 08/19/15 0401 08/20/15 0330 08/21/15 0332  LIPASE 56* 48 52* 61* 71*   No results for input(s): AMMONIA in the last 168 hours. CBC:  Recent Labs Lab 08/17/15 0349 08/18/15 0349 08/19/15 0401 08/20/15 0330 08/21/15 0332  WBC 7.8 6.6 5.2 5.7 5.5  NEUTROABS 5.5 4.3 3.1 2.9 2.9  HGB 11.0* 11.3* 11.1* 11.1* 11.9*  HCT 34.5* 35.2* 33.9* 34.1* 35.5*  MCV 90.1 90.3 89.7 86.8 86.2  PLT 302 318 337 340 376   Cardiac Enzymes: No results for input(s): CKTOTAL, CKMB, CKMBINDEX, TROPONINI in the last 168 hours. BNP: Invalid input(s): POCBNP CBG:  Recent Labs Lab 08/20/15 0741 08/20/15 1205 08/20/15 1715 08/20/15 2219 08/21/15 0748  GLUCAP 132* 148* 155* 184* 153*    Time coordinating discharge:  Greater than 30 minutes  Signed:  Xee Hollman, DO [facility] Pager: [phone] 08/21/2015, 8:49 AM

## 2015-08-21 NOTE — Progress Notes (Signed)
Patient's d/c instructions given, also prescription for oxycodone handed to patient. All questions answered appropriately. Patient is comfortable at this time. Waiting for husband to come pick her up.

## 2015-08-21 NOTE — Anesthesia Postprocedure Evaluation (Signed)
Anesthesia Post Note  Patient: [name]  Procedure(s) Performed: Procedure(s) (LRB): ESOPHAGOGASTRODUODENOSCOPY (EGD) WITH PROPOFOL (N/A)  Patient location during evaluation: Endoscopy Anesthesia Type: MAC Level of consciousness: awake Pain management: pain level controlled Vital Signs Assessment: post-procedure vital signs reviewed and stable Respiratory status: spontaneous breathing Cardiovascular status: stable Postop Assessment: no signs of nausea or vomiting Anesthetic complications: no    Last Vitals:  Filed Vitals:   08/20/15 2135 08/21/15 0518  BP: 130/73 158/82  Pulse: 65 60  Temp: 37.1 C 37 C  Resp: 18 18    Last Pain:  Filed Vitals:   08/21/15 0753  PainSc: 5                  Hommer Cunliffe

## 2015-08-23 ENCOUNTER — Ambulatory Visit: Payer: BC Managed Care – PPO

## 2015-08-26 ENCOUNTER — Ambulatory Visit (INDEPENDENT_AMBULATORY_CARE_PROVIDER_SITE_OTHER): Payer: BC Managed Care – PPO | Admitting: Internal Medicine

## 2015-08-26 ENCOUNTER — Encounter: Payer: Self-pay | Admitting: Internal Medicine

## 2015-08-26 VITALS — BP 106/70 | HR 64 | Ht 61.0 in | Wt 202.5 lb

## 2015-08-26 DIAGNOSIS — R1013 Epigastric pain: Secondary | ICD-10-CM | POA: Diagnosis not present

## 2015-08-26 DIAGNOSIS — K859 Acute pancreatitis without necrosis or infection, unspecified: Secondary | ICD-10-CM | POA: Diagnosis not present

## 2015-08-26 DIAGNOSIS — R110 Nausea: Secondary | ICD-10-CM | POA: Diagnosis not present

## 2015-08-26 DIAGNOSIS — K858 Other acute pancreatitis without necrosis or infection: Secondary | ICD-10-CM

## 2015-08-26 DIAGNOSIS — R935 Abnormal findings on diagnostic imaging of other abdominal regions, including retroperitoneum: Secondary | ICD-10-CM | POA: Diagnosis not present

## 2015-08-26 NOTE — Progress Notes (Signed)
HISTORY OF PRESENT ILLNESS:  [name] is a 60 y.o. female who is sent today by her primary care provider Dr. Moreen Fowler for follow-up after recent hospitalization with epigastric pain felt secondary to mild pancreatitis (CT scan showed mild focal inflammatory change possibly reflecting mild focal pancreatitis or duodenitis), possibly from hydrochlorothiazide. During her hospitalization she improved incompletely. Thus, upper endoscopy was performed 08/19/2015. This was normal with deep duodenal intubation. She was up slowly discharged home and continued to have some difficulties with postprandial epigastric discomfort and nausea. She saw her PCP and was prescribed Zofran. Repeat labs revealed mildly elevated lipase. However, since 2 days ago she reports that she is doing well. Her abdominal pain and nausea have resolved. She is back at work. No new complaints. She does take omeprazole for GERD. She did have colonoscopy in 2015 when she was being evaluated for abdominal pain. This revealed moderate diverticulosis and a hyperplastic polyp but was otherwise normal including ileal intubation.  REVIEW OF SYSTEMS:  All non-GI ROS negative upon review  Past Medical History  Diagnosis Date  . Hypertension   . Diabetes mellitus without complication ([place])   . Anemia 2006    required transfusion post TAH/BSO 05/2005  . GLA deficiency ([place])   . Glaucoma of both eyes   . Shortness of breath dyspnea   . Arthritis   . Chronic cough   . GERD (gastroesophageal reflux disease)     on prilosec  . Pancreatitis   . Colon polyps     hyperplastic  . Diverticulosis     Past Surgical History  Procedure Laterality Date  . Total abdominal hysterectomy w/ bilateral salpingoophorectomy  05/2005  . Hysteroscopy w/d&c  01/2005    for uterine fibroids.   . Esophagogastroduodenoscopy N/A 08/22/2013    Procedure: ESOPHAGOGASTRODUODENOSCOPY (EGD);  Surgeon: Irene Shipper, MD;  Location[facility] ENDOSCOPY;  Service:  Endoscopy;  Laterality: N/A;  . Colonoscopy N/A 08/22/2013    Procedure: COLONOSCOPY;  Surgeon: Irene Shipper, MD;  Location: [place];  Service: Endoscopy;  Laterality: N/A;  . Ventral hernia repair N/A 08/24/2013    Procedure: HERNIA REPAIR VENTRAL ADULT;  Surgeon: Edward Jolly, MD;  Location: [place];  Service: General;  Laterality: N/A;  . Insertion of mesh N/A 08/24/2013    Procedure: INSERTION OF MESH;  Surgeon: Edward Jolly, MD;  Location: [place];  Service: General;  Laterality: N/A;  . Panniculectomy N/A 08/24/2013    Procedure: PANNICULECTOMY;  Surgeon: Edward Jolly, MD;  Location: [place];  Service: General;  Laterality: N/A;  . Total hip arthroplasty Right 12/12/2013    Procedure: RIGHT TOTAL HIP ARTHROPLASTY ANTERIOR APPROACH;  Surgeon: Mcarthur Rossetti, MD;  Location: [place];  Service: Orthopedics;  Laterality: Right;  . Abdominal hysterectomy    . Hernia repair    . Joint replacement Right   . Lipoma excision Left 06/21/2015    Procedure: EXCISION OF LEFT SCALP LIPOMA;  Surgeon: Johnathan Hausen, MD;  Location: [place];  Service: General;  Laterality: Left;  . Esophagogastroduodenoscopy (egd) with propofol N/A 08/19/2015    Procedure: ESOPHAGOGASTRODUODENOSCOPY (EGD) WITH PROPOFOL;  Surgeon: Irene Shipper, MD;  Location: [facility] ENDOSCOPY;  Service: Endoscopy;  Laterality: N/A;    Social History [name]  reports that she quit smoking about 37 years ago. Her smoking use included Cigarettes. She has a 3.75 pack-year smoking history. She has never used smokeless tobacco. She reports that she does not drink alcohol  or use illicit drugs.  family history includes Colon cancer in her father; Emphysema in her mother.  Allergies  Allergen Reactions  . Diclofenac     Hives   . Penicillins Hives    Has patient had a PCN reaction causing immediate rash, facial/tongue/throat swelling, SOB or lightheadedness with hypotension: Yes Has patient had a PCN  reaction causing severe rash involving mucus membranes or skin necrosis: Yes Has patient had a PCN reaction that required hospitalization No Has patient had a PCN reaction occurring within the last 10 years: No. If all of the above answers are "NO", then may proceed with Cephalosporin use.        PHYSICAL EXAMINATION: Vital signs: BP 106/70 mmHg  Pulse 64  Ht 5\' 1"  (1.549 m)  Wt 202 lb 8 oz (91.853 kg)  BMI 38.28 kg/m2 General: Well-developed, Obese, well-nourished, no acute distress HEENT: Sclerae are anicteric, conjunctiva pink. Oral mucosa intact Lungs: Clear Heart: Regular Abdomen: soft, obese, nontender, nondistended, no obvious ascites, no peritoneal signs, normal bowel sounds. No organomegaly. Extremities: No clubbing cyanosis or edema Psychiatric: alert and oriented x3. Cooperative   ASSESSMENT:  #1. Recent hospitalization with upper abdominal pain. Felt to have mild focal pancreatitis by imaging and laboratory. Negative EGD. Now feeling better #2. Colonoscopy 2015 negative for neoplasia  PLAN:  #1. Low-fat diet #2. Zofran as needed for nausea. #3. Avoid hydrochlorothiazide #4. GI office follow-up 6 months. Sooner if needed for interval problems

## 2015-08-26 NOTE — Patient Instructions (Signed)
Please follow up with Dr. Perry in 6 months 

## 2015-09-03 ENCOUNTER — Ambulatory Visit
Admission: RE | Admit: 2015-09-03 | Discharge: 2015-09-03 | Disposition: A | Discharge status: Home / Self Care | Payer: BC Managed Care – PPO | Source: Ambulatory Visit

## 2015-09-03 DIAGNOSIS — Z1231 Encounter for screening mammogram for malignant neoplasm of breast: Secondary | ICD-10-CM

## 2016-05-25 ENCOUNTER — Other Ambulatory Visit: Payer: Self-pay | Admitting: Family Medicine

## 2016-05-25 ENCOUNTER — Ambulatory Visit
Admission: RE | Admit: 2016-05-25 | Discharge: 2016-05-25 | Disposition: A | Discharge status: Home / Self Care | Payer: BC Managed Care – PPO | Source: Ambulatory Visit | Attending: Family Medicine | Admitting: Family Medicine

## 2016-05-25 DIAGNOSIS — S20219A Contusion of unspecified front wall of thorax, initial encounter: Secondary | ICD-10-CM

## 2016-07-16 ENCOUNTER — Ambulatory Visit (HOSPITAL_COMMUNITY)
Admission: EM | Admit: 2016-07-16 | Discharge: 2016-07-16 | Disposition: A | Discharge status: Home / Self Care | Payer: BC Managed Care – PPO | Attending: Family Medicine | Admitting: Family Medicine

## 2016-07-16 ENCOUNTER — Encounter (HOSPITAL_COMMUNITY): Payer: Self-pay | Admitting: Emergency Medicine

## 2016-07-16 DIAGNOSIS — J069 Acute upper respiratory infection, unspecified: Secondary | ICD-10-CM | POA: Diagnosis not present

## 2016-07-16 DIAGNOSIS — B9789 Other viral agents as the cause of diseases classified elsewhere: Secondary | ICD-10-CM | POA: Diagnosis not present

## 2016-07-16 MED ORDER — BENZONATATE 100 MG PO CAPS: 100.0000 mg | ORAL_CAPSULE | Freq: Three times a day (TID) | ORAL | 0 refills | Status: DC

## 2016-07-16 MED ORDER — ACETAMINOPHEN 325 MG PO TABS
650.0000 mg | ORAL_TABLET | Freq: Once | ORAL | Status: AC
  Administered 2016-07-16: 650 mg via ORAL

## 2016-07-16 MED ORDER — ACETAMINOPHEN 325 MG PO TABS
ORAL_TABLET | ORAL | Status: AC
  Filled 2016-07-16: qty 2

## 2016-07-16 NOTE — ED Provider Notes (Signed)
CSN: [id number]     Arrival date & time 07/16/16  1415 History   None    Chief Complaint  Patient presents with  . URI   (Consider location/radiation/quality/duration/timing/severity/associated sxs/prior Treatment) 61 year old female presents to clinic with chief complaint of fever, muscle aches, body aches, head ache, cough, and loss of appetite. She reports 10 days ago she [facility] and was treated with Tamiflu, she completed her medication, and started to feel better but now is feeling worse. She reports her fever has been as high as 102 at home. She has no nausea, vomiting, or diarrhea, does have some congestion   The history is provided by the patient.  URI  Presenting symptoms: congestion, cough, fatigue, fever and rhinorrhea   Presenting symptoms: no ear pain and no sore throat   Associated symptoms: no arthralgias, no myalgias, no neck pain, no sinus pain and no wheezing     Past Medical History:  Diagnosis Date  . Anemia 2006   required transfusion post TAH/BSO 05/2005  . Arthritis   . Chronic cough   . Colon polyps    hyperplastic  . Diabetes mellitus without complication ([place])   . Diverticulosis   . GERD (gastroesophageal reflux disease)    on prilosec  . GLA deficiency ([place])   . Glaucoma of both eyes   . Hypertension   . Pancreatitis   . Shortness of breath dyspnea    Past Surgical History:  Procedure Laterality Date  . ABDOMINAL HYSTERECTOMY    . COLONOSCOPY N/A 08/22/2013   Procedure: COLONOSCOPY;  Surgeon: Irene Shipper, MD;  Location[facility] ENDOSCOPY;  Service: Endoscopy;  Laterality: N/A;  . ESOPHAGOGASTRODUODENOSCOPY N/A 08/22/2013   Procedure: ESOPHAGOGASTRODUODENOSCOPY (EGD);  Surgeon: Irene Shipper, MD;  Location: [facility] ENDOSCOPY;  Service: Endoscopy;  Laterality: N/A;  . ESOPHAGOGASTRODUODENOSCOPY (EGD) WITH PROPOFOL N/A 08/19/2015   Procedure: ESOPHAGOGASTRODUODENOSCOPY (EGD) WITH PROPOFOL;  Surgeon: Irene Shipper, MD;   Location: [facility] ENDOSCOPY;  Service: Endoscopy;  Laterality: N/A;  . HERNIA REPAIR    . HYSTEROSCOPY W/D&C  01/2005   for uterine fibroids.   . INSERTION OF MESH N/A 08/24/2013   Procedure: INSERTION OF MESH;  Surgeon: Edward Jolly, MD;  Location: [place];  Service: General;  Laterality: N/A;  . JOINT REPLACEMENT Right   . LIPOMA EXCISION Left 06/21/2015   Procedure: EXCISION OF LEFT SCALP LIPOMA;  Surgeon: Johnathan Hausen, MD;  Location: [place];  Service: General;  Laterality: Left;  . PANNICULECTOMY N/A 08/24/2013   Procedure: PANNICULECTOMY;  Surgeon: Edward Jolly, MD;  Location: [place];  Service: General;  Laterality: N/A;  . TOTAL ABDOMINAL HYSTERECTOMY W/ BILATERAL SALPINGOOPHORECTOMY  05/2005  . TOTAL HIP ARTHROPLASTY Right 12/12/2013   Procedure: RIGHT TOTAL HIP ARTHROPLASTY ANTERIOR APPROACH;  Surgeon: Mcarthur Rossetti, MD;  Location: [place];  Service: Orthopedics;  Laterality: Right;  . VENTRAL HERNIA REPAIR N/A 08/24/2013   Procedure: HERNIA REPAIR VENTRAL ADULT;  Surgeon: Edward Jolly, MD;  Location: [facility] OR;  Service: General;  Laterality: N/A;   Family History  Problem Relation Age of Onset  . Emphysema Mother     smoked  . Colon cancer Father     7-s   Social History  Substance Use Topics  . Smoking status: Former Smoker    Packs/day: 0.25    Years: 15.00    Types: Cigarettes    Quit date: 06/22/1978  . Smokeless tobacco: Never  Used  . Alcohol use No   OB History    No data available     Review of Systems  Constitutional: Positive for appetite change, chills, fatigue and fever.  HENT: Positive for congestion and rhinorrhea. Negative for ear discharge, ear pain, sinus pain, sinus pressure and sore throat.   Eyes: Negative.   Respiratory: Positive for cough. Negative for choking, shortness of breath and wheezing.   Cardiovascular: Negative.   Gastrointestinal: Negative for abdominal pain, diarrhea, nausea and vomiting.   Genitourinary: Negative.   Musculoskeletal: Negative.  Negative for arthralgias, back pain, myalgias, neck pain and neck stiffness.  Skin: Negative.   Neurological: Negative for dizziness, syncope, weakness and light-headedness.  All other systems reviewed and are negative.   Allergies  Diclofenac and Penicillins  Home Medications   Prior to Admission medications   Medication Sig Start Date End Date Taking? Authorizing Provider  amLODipine (NORVASC) 5 MG tablet Take 1 tablet (5 mg total) by mouth daily. 08/21/15  Yes Orson Eva, MD  glimepiride (AMARYL) 4 MG tablet Take 4 mg by mouth daily with breakfast.   Yes Historical Provider, MD  losartan (COZAAR) 100 MG tablet Take 1 tablet (100 mg total) by mouth daily. 08/21/15  Yes Orson Eva, MD  metFORMIN (GLUCOPHAGE) 1000 MG tablet Take 1,000 mg by mouth 2 (two) times daily with a meal.   Yes Historical Provider, MD  albuterol (PROVENTIL HFA;VENTOLIN HFA) 108 (90 BASE) MCG/ACT inhaler Inhale 2 puffs into the lungs every 6 (six) hours as needed for wheezing or shortness of breath. 02/20/15   Billy Fischer, MD  albuterol (PROVENTIL) (2.5 MG/3ML) 0.083% nebulizer solution Take 2.5 mg by nebulization every 6 (six) hours as needed.  07/13/15   Historical Provider, MD  aspirin 81 MG tablet Take 81 mg by mouth daily.    Historical Provider, MD  benzonatate (TESSALON) 100 MG capsule Take 1 capsule (100 mg total) by mouth every 8 (eight) hours. 07/16/16   Barnet Glasgow, NP  bimatoprost (LUMIGAN) 0.03 % ophthalmic solution Place 1 drop into both eyes at bedtime.    Historical Provider, MD  budesonide-formoterol (SYMBICORT) 80-4.5 MCG/ACT inhaler Take 2 puffs first thing in am and then another 2 puffs about 12 hours later. 01/03/15   Tanda Rockers, MD  fluticasone (FLONASE) 50 MCG/ACT nasal spray Place 1 spray into both nostrils daily as needed for allergies.  07/13/15   Historical Provider, MD  MELATONIN PO Take 1 tablet by mouth at bedtime as needed (sleep).      Historical Provider, MD  omeprazole (PRILOSEC) 40 MG capsule Take 40 mg by mouth daily.  07/13/15   Historical Provider, MD  oxyCODONE-acetaminophen (PERCOCET/ROXICET) 5-325 MG tablet Take 1 tablet by mouth every 6 (six) hours as needed for severe pain. 08/21/15   Orson Eva, MD   Meds Ordered and Administered this Visit   Medications  acetaminophen (TYLENOL) tablet 650 mg (650 mg Oral Given 07/16/16 1455)    BP 129/75 (BP Location: Left Arm)   Pulse 103   Temp 103 F (39.4 C) (Oral)   Resp 18   SpO2 97%  No data found.   Physical Exam  Constitutional: She is oriented to person, place, and time. She appears well-developed and well-nourished. She has a sickly appearance. She appears ill. No distress.  HENT:  Head: Normocephalic and atraumatic.  Right Ear: Tympanic membrane and external ear normal.  Left Ear: Tympanic membrane and external ear normal.  Nose: Rhinorrhea present. Right sinus exhibits  no maxillary sinus tenderness and no frontal sinus tenderness. Left sinus exhibits no maxillary sinus tenderness and no frontal sinus tenderness.  Mouth/Throat: Uvula is midline and oropharynx is clear and moist. No oropharyngeal exudate.  Eyes: Pupils are equal, round, and reactive to light.  Neck: Normal range of motion. Neck supple. No JVD present.  Cardiovascular: Normal rate and regular rhythm.   Pulmonary/Chest: Effort normal and breath sounds normal. No respiratory distress. She has no wheezes.  Abdominal: Soft. Bowel sounds are normal. She exhibits no distension. There is no tenderness. There is no guarding.  Lymphadenopathy:       Head (right side): Submandibular and tonsillar adenopathy present.       Head (left side): Submandibular and tonsillar adenopathy present.    She has no cervical adenopathy.  Neurological: She is alert and oriented to person, place, and time.  Skin: Skin is warm. Capillary refill takes less than 2 seconds. She is diaphoretic.  Psychiatric: She has a  normal mood and affect.  Nursing note and vitals reviewed.   Urgent Care Course     Procedures (including critical care time)  Labs Review Labs Reviewed - No data to display  Imaging Review No results found.   Visual Acuity Review  Right Eye Distance:   Left Eye Distance:   Bilateral Distance:    Right Eye Near:   Left Eye Near:    Bilateral Near:         MDM   1. Viral URI with cough   You most likely have a viral URI, I advise rest, plenty of fluids and management of symptoms with over the counter medicines. For symptoms you may take Tylenol as needed every 4-6 hours for body aches or fever, not to exceed 4,000 mg a day, Take mucinex or mucinex DM ever 12 hours with a full glass of water, you may use an inhaled steroid such as Flonase, 2 sprays each nostril once a day for congestion, or an antihistamine such as Claritin or Zyrtec once a day. In addition to these therapies, I have prescribed Tessalon, take 1 tablet every 8 hours as needed for cough. Should your symptoms worsen or fail to resolve, follow up with your primary care provider or return to clinic.       Barnet Glasgow, NP 07/16/16 [phone]

## 2016-07-16 NOTE — ED Triage Notes (Signed)
Pt c/o cold sx onset: today  Sx include: fever, BA, HA, prod cough, fatigue  Reports she finished Tamiflu 2 weeks ago for flu sx  A&O x4... NAD

## 2016-07-16 NOTE — Discharge Instructions (Signed)
You most likely have a viral URI, I advise rest, plenty of fluids and management of symptoms with over the counter medicines. For symptoms you may take Tylenol as needed every 4-6 hours for body aches or fever, not to exceed 4,000 mg a day, Take mucinex or mucinex DM ever 12 hours with a full glass of water, you may use an inhaled steroid such as Flonase, 2 sprays each nostril once a day for congestion, or an antihistamine such as Claritin or Zyrtec once a day. In addition to these therapies, I have prescribed Tessalon, take 1 tablet every 8 hours as needed for cough. Should your symptoms worsen or fail to resolve, follow up with your primary care provider or return to clinic.

## 2016-10-30 ENCOUNTER — Other Ambulatory Visit: Payer: Self-pay | Admitting: Family Medicine

## 2016-10-30 DIAGNOSIS — Z1231 Encounter for screening mammogram for malignant neoplasm of breast: Secondary | ICD-10-CM

## 2016-11-17 ENCOUNTER — Other Ambulatory Visit: Payer: Self-pay | Admitting: Family Medicine

## 2016-11-17 ENCOUNTER — Ambulatory Visit
Admission: RE | Admit: 2016-11-17 | Discharge: 2016-11-17 | Disposition: A | Discharge status: Home / Self Care | Payer: BC Managed Care – PPO | Source: Ambulatory Visit | Attending: Family Medicine | Admitting: Family Medicine

## 2016-11-17 DIAGNOSIS — Z1231 Encounter for screening mammogram for malignant neoplasm of breast: Secondary | ICD-10-CM

## 2017-03-24 IMAGING — CT CT ABD-PELV W/ CM
2 of 5 series · 15 of 46 positions shown, 17 images · IV contrast (OMNIPAQUE 300)
Comparison: 07/04/2014

CLINICAL DATA: Five day history of diffuse abdominal pain with
constipation and nausea.

EXAM:
CT ABDOMEN AND PELVIS WITH CONTRAST
TECHNIQUE: Multidetector CT imaging of the abdomen and pelvis was performed
using the standard protocol following bolus administration of
intravenous contrast.
CONTRAST:  100mL OMNIPAQUE IOHEXOL 300 MG/ML  SOLN

[Series 2: abd/pel with · axial · 0.72mm/px · z∈[-431,-36]mm · 12 of 89 slices shown, 14 images]
[im 5/89  soft-tissue]
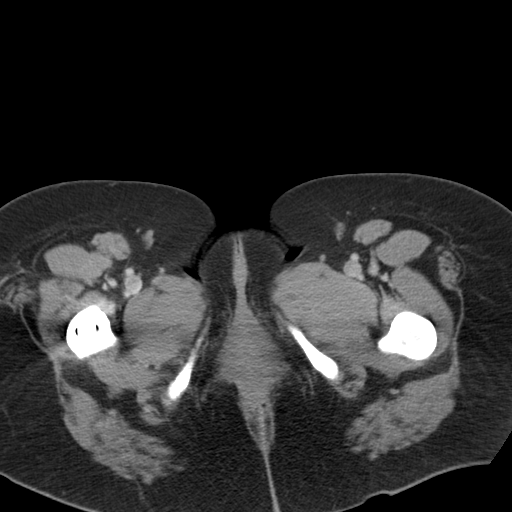
[im 5/89  bone]
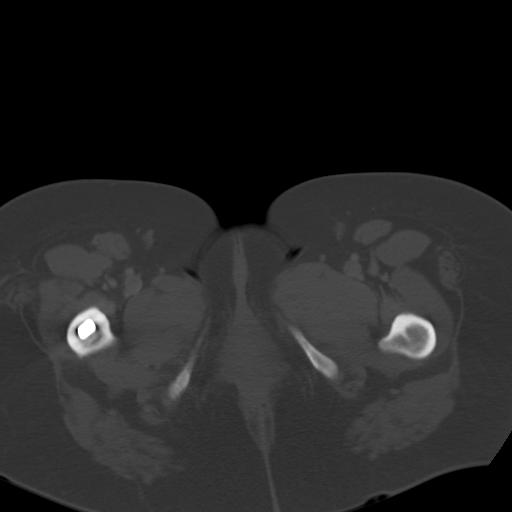
[im 15/89  soft-tissue]
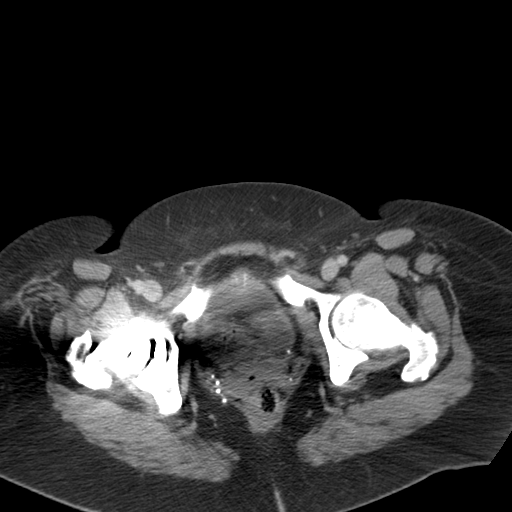
[im 20/89  soft-tissue]
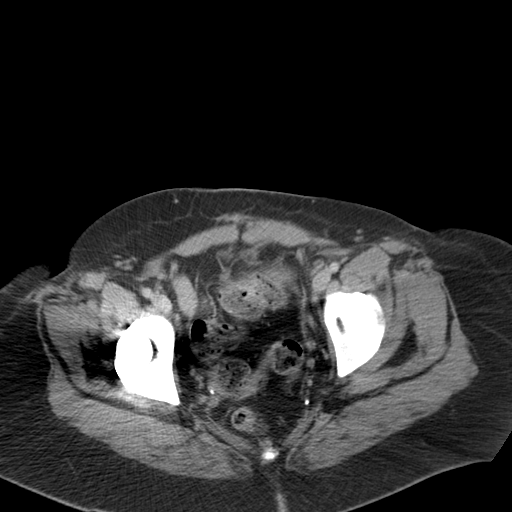
[im 25/89  soft-tissue]
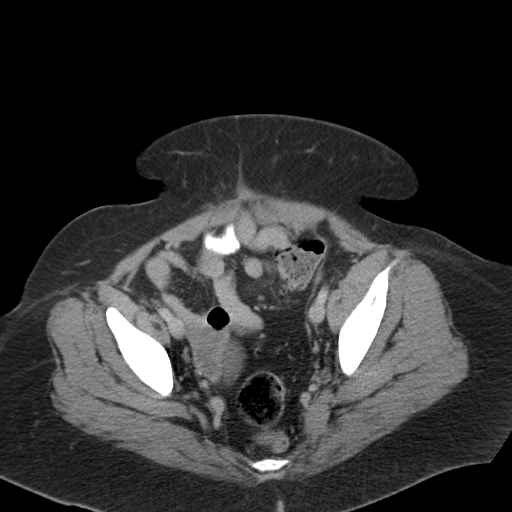
[im 35/89  soft-tissue]
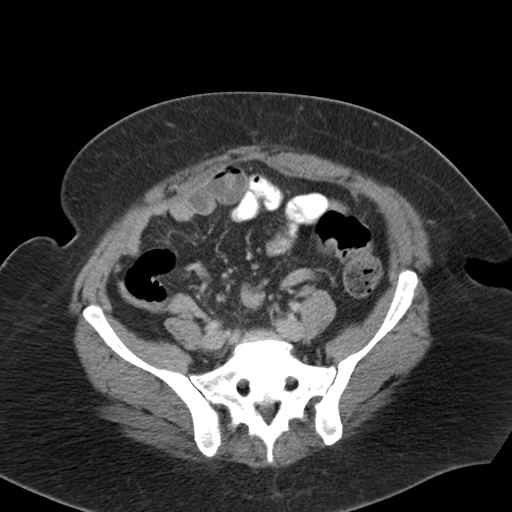
[im 40/89  soft-tissue]
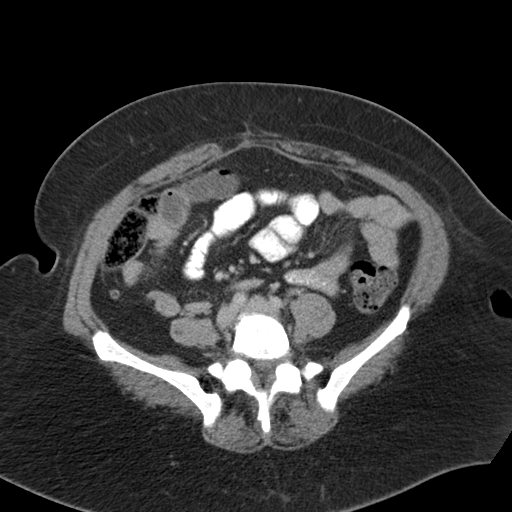
[im 49/89  soft-tissue]
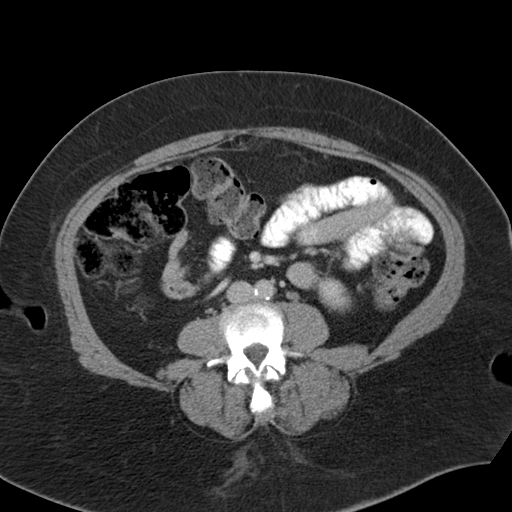
[im 54/89  soft-tissue]
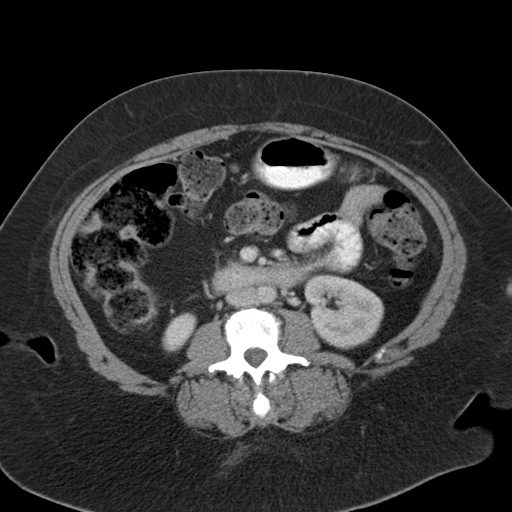
[im 64/89  soft-tissue]
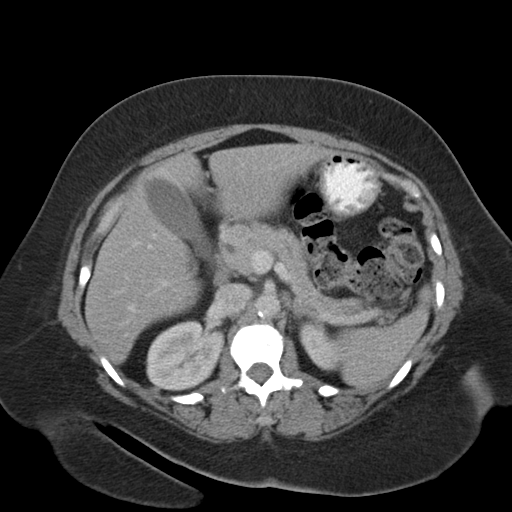
[im 64/89  bone]
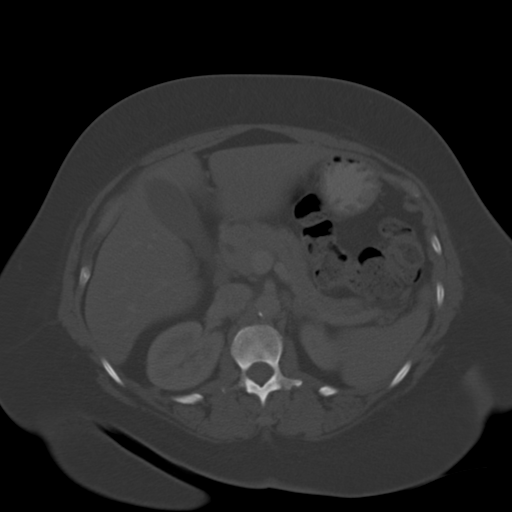
[im 69/89  soft-tissue]
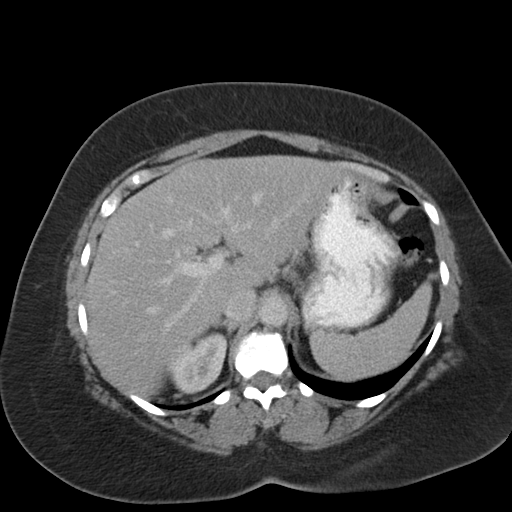
[im 74/89  soft-tissue]
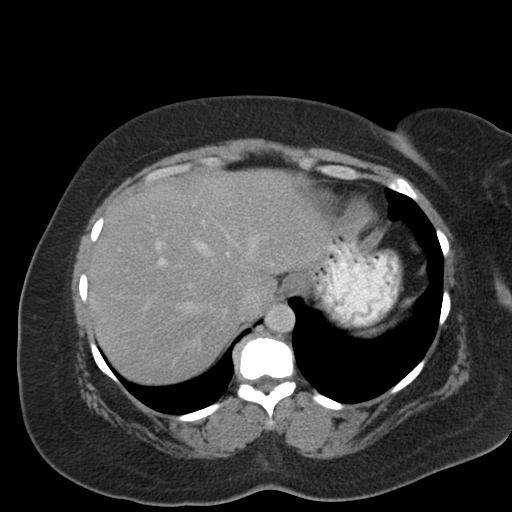
[im 84/89  soft-tissue]
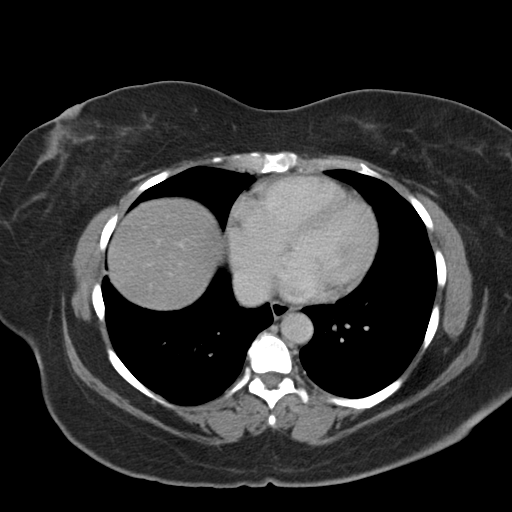

[Series 3: coronal a/|p · coronal · 0.67mm/px · 3 of 101 slices shown]
[im 34/101  soft-tissue]
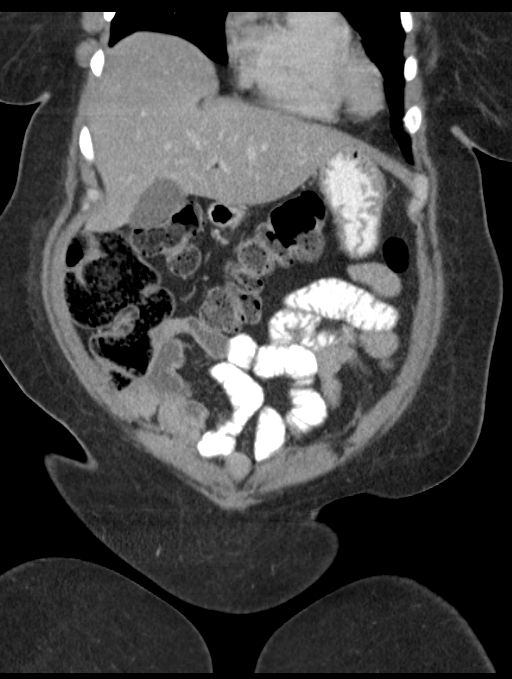
[im 45/101  soft-tissue]
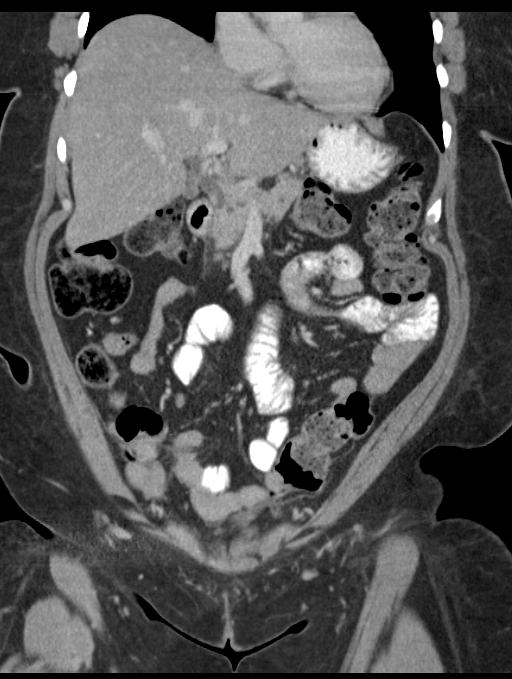
[im 56/101  soft-tissue]
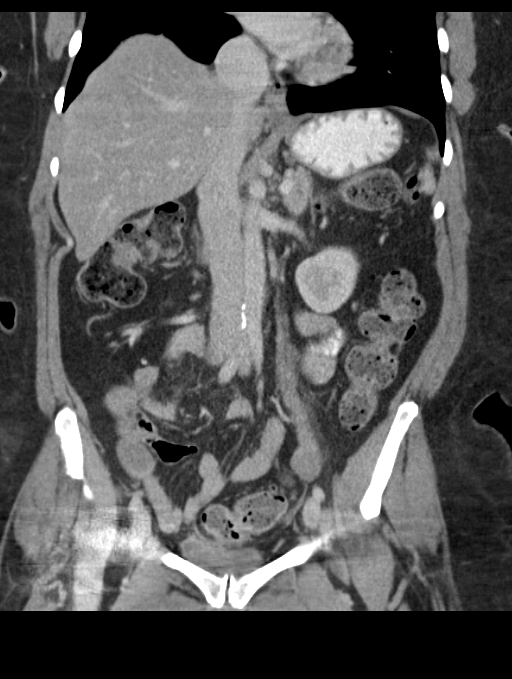

[15 of 46 positions shown; findings below may reference images not displayed]

FINDINGS: Lower chest:  Unremarkable.

Hepatobiliary: Small area of low attenuation in the anterior liver,
adjacent to the falciform ligament, is in a characteristic location
for focal fatty change. No focal abnormality within the liver
parenchyma. There is no evidence for gallstones, gallbladder wall
thickening, or pericholecystic fluid. No intrahepatic or
extrahepatic biliary dilation.

Pancreas: No focal mass lesion. No dilatation of the main duct. No
intraparenchymal cyst. There is very subtle edema seen around the
pancreatic head/ uncinate process and second/ third portion of the
duodenum.

Spleen: No splenomegaly. No focal mass lesion.

Adrenals/Urinary Tract: No adrenal nodule or mass. Kidneys are
normal in appearance bilaterally. No evidence for hydroureter. The
urinary bladder appears normal for the degree of distention.

Stomach/Bowel: Stomach is nondistended. No gastric wall thickening.
No evidence of outlet obstruction. Duodenum is normally positioned
as is the ligament of Treitz. No small bowel wall thickening. No
small bowel dilatation. The terminal ileum is normal. The appendix
is normal. No gross colonic mass. No colonic wall thickening. No
substantial diverticular change.

Vascular/Lymphatic: There is abdominal aortic atherosclerosis
without aneurysm. There is no gastrohepatic or hepatoduodenal
ligament lymphadenopathy. No intraperitoneal or retroperitoneal
lymphadenopathy. No pelvic sidewall lymphadenopathy.

Reproductive: Uterus is surgically absent. There is no adnexal mass.

Other: No intraperitoneal free fluid.

Musculoskeletal: Pelvic floor laxity is evident. Evidence of prior
ventral mesh placement in the abdomen. Patient is status post right
hip replacement. Bone windows reveal no worrisome lytic or sclerotic
osseous lesions.
IMPRESSION: 1. Very subtle stranding around the inferior pancreatic
head/uncinate process and along the medial wall of the distal
descending duodenum suggests edema. This is a very subtle finding
and was not definitely seen previously, but raises the question of
focal pancreatitis or duodenitis.
2. Otherwise unremarkable study.

## 2017-03-25 ENCOUNTER — Encounter: Payer: Self-pay | Admitting: Physician Assistant

## 2017-03-25 ENCOUNTER — Ambulatory Visit: Payer: BC Managed Care – PPO | Admitting: Gastroenterology

## 2017-03-25 ENCOUNTER — Ambulatory Visit (INDEPENDENT_AMBULATORY_CARE_PROVIDER_SITE_OTHER): Payer: BC Managed Care – PPO | Admitting: Physician Assistant

## 2017-03-25 ENCOUNTER — Other Ambulatory Visit: Payer: BC Managed Care – PPO

## 2017-03-25 VITALS — BP 126/72 | HR 84 | Ht 62.0 in | Wt 210.0 lb

## 2017-03-25 DIAGNOSIS — R1084 Generalized abdominal pain: Secondary | ICD-10-CM | POA: Diagnosis not present

## 2017-03-25 DIAGNOSIS — K219 Gastro-esophageal reflux disease without esophagitis: Secondary | ICD-10-CM

## 2017-03-25 DIAGNOSIS — R194 Change in bowel habit: Secondary | ICD-10-CM | POA: Diagnosis not present

## 2017-03-25 NOTE — Progress Notes (Signed)
Subjective:    Patient ID: [name], female    DOB: 1955-09-09, 61 y.o.   MRN: [id number]  HPI [name] is a pleasant 61 year old African-American female, known to Dr. Henrene [name] who was last seen in our office in March 2017. She comes in today with concerns about changes in her bowel habits and also has been having an occasional pain in her left mid abdomen with coughing. Patient had EGD done in February 2017 which was normal. Colonoscopy was done in March 2015 with finding of moderate diverticulosis, she had one diminutive polyp which was removed and was hyperplastic. She was recommended for 5 year interval follow-up. Patient has history of hypertension, adult-onset diabetes mellitus, obesity, and had a ventral hernia repair done per Dr. Excell [name] with mesh in 2016 and also had panniculectomy at that same surgery. She has been doing well as far as her reflux is concerned on omeprazole 40 mg by mouth daily. She says over the past month or so she has noticed that her stools are different. She is not having diarrhea but says the stools are mushy or. No melena or hematochezia and generally having 1-2 bowel movements per day. She does not feel that her stools are oily and are not floating. She has not started any new medications or supplements and has not had any recent antibiotics. She has no complaints of abdominal pain. Appetite has been fine and weight has been stable. She also mentions that she gets occasional pain in her left mid abdomen with coughing. She says it's a feeling like something is moving when she coughs. This is been present for a screening ever since her abdominal surgery in 2016 and has not changed.  Review of Systems Pertinent positive and negative review of systems were noted in the above HPI section.  All other review of systems was otherwise negative.  Outpatient Encounter Prescriptions as of 03/25/2017  Medication Sig  . albuterol (PROVENTIL HFA;VENTOLIN HFA) 108 (90 BASE)  MCG/ACT inhaler Inhale 2 puffs into the lungs every 6 (six) hours as needed for wheezing or shortness of breath.  Marland Kitchen albuterol (PROVENTIL) (2.5 MG/3ML) 0.083% nebulizer solution Take 2.5 mg by nebulization every 6 (six) hours as needed.   Marland Kitchen amLODipine (NORVASC) 5 MG tablet Take 1 tablet (5 mg total) by mouth daily.  Marland Kitchen aspirin 81 MG tablet Take 81 mg by mouth daily.  . benzonatate (TESSALON) 100 MG capsule Take 1 capsule (100 mg total) by mouth every 8 (eight) hours.  . bimatoprost (LUMIGAN) 0.03 % ophthalmic solution Place 1 drop into both eyes at bedtime.  . budesonide-formoterol (SYMBICORT) 80-4.5 MCG/ACT inhaler Take 2 puffs first thing in am and then another 2 puffs about 12 hours later.  . fluticasone (FLONASE) 50 MCG/ACT nasal spray Place 1 spray into both nostrils daily as needed for allergies.   Marland Kitchen glimepiride (AMARYL) 4 MG tablet Take 4 mg by mouth daily with breakfast.  . losartan (COZAAR) 100 MG tablet Take 1 tablet (100 mg total) by mouth daily.  Marland Kitchen MELATONIN PO Take 1 tablet by mouth at bedtime as needed (sleep).   . metFORMIN (GLUCOPHAGE) 1000 MG tablet Take 1,000 mg by mouth 2 (two) times daily with a meal.  . omeprazole (PRILOSEC) 40 MG capsule Take 40 mg by mouth daily.   . [DISCONTINUED] oxyCODONE-acetaminophen (PERCOCET/ROXICET) 5-325 MG tablet Take 1 tablet by mouth every 6 (six) hours as needed for severe pain.   No facility-administered encounter medications on file as of 03/25/2017.  Allergies  Allergen Reactions  . Diclofenac     Hives   . Penicillins Hives    Has patient had a PCN reaction causing immediate rash, facial/tongue/throat swelling, SOB or lightheadedness with hypotension: Yes Has patient had a PCN reaction causing severe rash involving mucus membranes or skin necrosis: Yes Has patient had a PCN reaction that required hospitalization No Has patient had a PCN reaction occurring within the last 10 years: No. If all of the above answers are "NO", then may  proceed with Cephalosporin use.    Patient Active Problem List   Diagnosis Date Noted  . Abdominal pain, epigastric   . Abnormal CT of the abdomen   . Pancreatitis, acute   . Acute pancreatitis 08/13/2015  . UTI (lower urinary tract infection) 08/13/2015  . Severe obesity (BMI >= 40) ([place]) 01/23/2015  . Sinusitis, chronic 01/08/2015  . Cough variant asthma 01/03/2015  . Arthritis of right hip 12/12/2013  . 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 Benign neoplasm of colon 08/22/2013  . Special screening for malignant neoplasms, colon 08/22/2013  . Abdominal pain 08/19/2013  . Diabetes mellitus ([place]) 08/19/2013  . Essential hypertension, benign 08/19/2013  . Ventral hernia 08/19/2013   Social History   Social History  . Marital status: Married    Spouse name: N/A  . Number of children: N/A  . Years of education: N/A   Occupational History  . Social Worker    Social History Main Topics  . Smoking status: Former Smoker    Packs/day: 0.25    Years: 15.00    Types: Cigarettes    Quit date: 06/22/1978  . Smokeless tobacco: Never Used  . Alcohol use No  . Drug use: No  . Sexual activity: Not on file   Other Topics Concern  . Not on file   Social History Narrative  . No narrative on file    [name]'s family history includes Breast cancer in her sister; Colon cancer in her father; Emphysema in her mother.      Objective:    Vitals:   03/25/17 0907  BP: 126/72  Pulse: 84    Physical Exam  well-developed older African-American female in no acute distress pleasant blood pressure 126/72 pulse 84, height 5 foot 2, weight 210, BMI 38.4. HEENT; nontraumatic normocephalic EOMI PERRLA sclera anicteric, Cardiovascular; regular rate and rhythm with S1-S2 no murmur rub or gallop, Pulmonary ;clear bilaterally, Abdomen ;soft, bowel sounds are present, she does have a midline incisional scar and a large lower abdominal transverse incisional scar, no palpable mass  or hepatosplenomegaly is no focal tenderness, Rectal ;exam not done, Extremities; no clubbing cyanosis or edema skin warm and dry, Neuropsych ;mood and affect appropriate       Assessment & Plan:   #52 61 year old African-American female with mild change in bowel habits with softer stools over the past 1 month. No associated abdominal pain ,discomfort ,melena hematochezia etc. #2 intermittent left mid quadrant abdominal pain with coughing-suspect this is secondary to scar tissue from ventral hernia repair with mesh and panniculectomy done 2016 #3 diverticulosis #4  colon cancer screening-up-to-date last colonoscopy March 2015 1 hyperplastic polyp, indicated for 10 year interval follow-up #5 GERD stable #6 adult-onset diabetes mellitus #7 hypertension  Plan; add Benefiber one scoop daily in a glass of water We'll check stool for lactoferrin and fecal elastase Continue omeprazole 40 mg by mouth every morning I do not think she needs imaging or further evaluation at this time.  Patient was advised that if any of her symptoms progressed that she should be seen in follow-up and could proceed with further evaluation at that time.  Jazmin Ley S Rosia Syme PA-C 03/25/2017   Cc: Antony Contras, MD

## 2017-03-25 NOTE — Patient Instructions (Signed)
Please go to the basement level to have your labs drawn.  Add Benefiber- one dose daily in 8 oz in a glass of water.

## 2017-03-25 NOTE — Progress Notes (Signed)
Assessment and plans reviewed  

## 2017-03-26 ENCOUNTER — Other Ambulatory Visit: Payer: BC Managed Care – PPO

## 2017-03-26 DIAGNOSIS — K219 Gastro-esophageal reflux disease without esophagitis: Secondary | ICD-10-CM

## 2017-03-26 DIAGNOSIS — R194 Change in bowel habit: Secondary | ICD-10-CM

## 2017-04-03 LAB — FECAL LACTOFERRIN, QUANT
Fecal Lactoferrin: POSITIVE — AB
MICRO NUMBER:: 81111050
SPECIMEN QUALITY:: ADEQUATE

## 2017-04-03 LAB — PANCREATIC ELASTASE, FECAL: Pancreatic Elastase-1, Stool: >500 mcg/g

## 2017-07-07 ENCOUNTER — Other Ambulatory Visit: Payer: Self-pay | Admitting: Obstetrics and Gynecology

## 2017-07-07 DIAGNOSIS — R2231 Localized swelling, mass and lump, right upper limb: Secondary | ICD-10-CM

## 2017-07-07 DIAGNOSIS — R2230 Localized swelling, mass and lump, unspecified upper limb: Secondary | ICD-10-CM | POA: Insufficient documentation

## 2017-07-08 ENCOUNTER — Ambulatory Visit
Admission: RE | Admit: 2017-07-08 | Discharge: 2017-07-08 | Disposition: A | Discharge status: Home / Self Care | Payer: BC Managed Care – PPO | Source: Ambulatory Visit | Attending: Obstetrics and Gynecology | Admitting: Obstetrics and Gynecology

## 2017-07-08 ENCOUNTER — Ambulatory Visit: Payer: BC Managed Care – PPO

## 2017-07-08 ENCOUNTER — Other Ambulatory Visit: Payer: Self-pay | Admitting: Obstetrics and Gynecology

## 2017-07-08 DIAGNOSIS — R2231 Localized swelling, mass and lump, right upper limb: Secondary | ICD-10-CM

## 2017-07-12 ENCOUNTER — Other Ambulatory Visit: Payer: Self-pay | Admitting: Obstetrics and Gynecology

## 2017-07-22 ENCOUNTER — Encounter (INDEPENDENT_AMBULATORY_CARE_PROVIDER_SITE_OTHER): Payer: BC Managed Care – PPO

## 2017-08-02 ENCOUNTER — Encounter (INDEPENDENT_AMBULATORY_CARE_PROVIDER_SITE_OTHER): Payer: Self-pay | Admitting: Family Medicine

## 2017-08-02 ENCOUNTER — Ambulatory Visit (INDEPENDENT_AMBULATORY_CARE_PROVIDER_SITE_OTHER): Payer: BC Managed Care – PPO | Admitting: Family Medicine

## 2017-08-02 VITALS — BP 131/86 | HR 59 | Temp 97.7°F | Ht 62.0 in | Wt 204.0 lb

## 2017-08-02 DIAGNOSIS — R5383 Other fatigue: Secondary | ICD-10-CM

## 2017-08-02 DIAGNOSIS — Z1331 Encounter for screening for depression: Secondary | ICD-10-CM

## 2017-08-02 DIAGNOSIS — Z0289 Encounter for other administrative examinations: Secondary | ICD-10-CM

## 2017-08-02 DIAGNOSIS — E66812 Obesity, class 2: Secondary | ICD-10-CM

## 2017-08-02 DIAGNOSIS — E119 Type 2 diabetes mellitus without complications: Secondary | ICD-10-CM | POA: Diagnosis not present

## 2017-08-02 DIAGNOSIS — Z9189 Other specified personal risk factors, not elsewhere classified: Secondary | ICD-10-CM | POA: Diagnosis not present

## 2017-08-02 DIAGNOSIS — Z6837 Body mass index (BMI) 37.0-37.9, adult: Secondary | ICD-10-CM

## 2017-08-02 DIAGNOSIS — R0602 Shortness of breath: Secondary | ICD-10-CM

## 2017-08-02 MED ORDER — ONETOUCH LANCETS MISC: 1.0000 | Freq: Every morning | 0 refills | Status: DC

## 2017-08-02 MED ORDER — GLUCOSE BLOOD VI STRP: ORAL_STRIP | 0 refills | Status: DC

## 2017-08-02 MED ORDER — BLOOD GLUCOSE MONITOR KIT: PACK | 0 refills | Status: DC

## 2017-08-02 NOTE — Progress Notes (Signed)
. .  Office: [phone]  /  Fax: [phone]   HPI:   Chief Complaint: OBESITY  [name] (MR# [id number]) is a 62 y.o. female who presents on 08/02/2017 for obesity evaluation and treatment. Current BMI is Body mass index is 37.31 kg/m.[name] [name] has struggled with obesity for years and has been unsuccessful in either losing weight or maintaining long term weight loss. [name] has never tried to change her diet, but took "diet pills" from her PCP many years ago. [name] had no paramount weight loss. [name] attended our information session and states she is currently in the action stage of change and ready to dedicate time achieving and maintaining a healthier weight.  [name] states her family eats meals together her desired weight loss is 82 lbs she started gaining weight after last child 23 yrs ago her heaviest weight ever was 225 lbs. she has significant food cravings issues  she is frequently drinking liquids with calories she frequently makes poor food choices   [name] feels her energy is lower than it should be. This has worsened with weight gain and has not worsened recently. [name] admits to daytime somnolence and  denies waking up still tired. Patient is at risk for obstructive sleep apnea. Patent has a history of symptoms of daytime fatigue and morning headache. Patient generally gets 5 or 6 hours of sleep per night, and states they generally have restless sleep. Snoring is present. Apneic episodes are present. Epworth Sleepiness Score is 2  Dyspnea on exertion [name] notes increasing shortness of breath with exercising and seems to be worsening over time with weight gain. She notes getting out of breath sooner with activity than she used to. This has not gotten worse recently. [name] denies orthopnea.  Diabetes II [name] has a diagnosis of diabetes type II. She is on Metformin, Glimepiride and Jardiance,  Kalese is not checking her blood sugar at home, and there is no recent  Hgb A1c in Epic. [name] denies any hypoglycemic episodes. She is attempting to work on intensive lifestyle modifications including diet, exercise, and weight loss to help control her blood glucose levels.  Depression Screen [name]'s Food and Mood (modified PHQ-9) score was  Depression screen PHQ 2/9 08/02/2017  Decreased Interest 0  Down, Depressed, Hopeless 1  PHQ - 2 Score 1  Altered sleeping 2  Tired, decreased energy 2  Change in appetite 2  Feeling bad or failure about yourself  0  Trouble concentrating 1  Moving slowly or fidgety/restless 0  Suicidal thoughts 0  PHQ-9 Score 8  Difficult doing work/chores Not difficult at all    ALLERGIES: Allergies  Allergen Reactions  . Diclofenac     Hives   . Penicillins Hives    Has patient had a PCN reaction causing immediate rash, facial/tongue/throat swelling, SOB or lightheadedness with hypotension: Yes Has patient had a PCN reaction causing severe rash involving mucus membranes or skin necrosis: Yes Has patient had a PCN reaction that required hospitalization No Has patient had a PCN reaction occurring within the last 10 years: No. If all of the above answers are "NO", then may proceed with Cephalosporin use.     MEDICATIONS: Current Outpatient Medications on File Prior to Visit  Medication Sig Dispense Refill  . albuterol (PROVENTIL) (2.5 MG/3ML) 0.083% nebulizer solution Take 2.5 mg by nebulization every 6 (six) hours as needed.     [name] amLODipine (NORVASC) 5 MG tablet Take 1 tablet (5 mg total) by mouth daily. 30 tablet  1  . aspirin 81 MG tablet Take 81 mg by mouth daily.    . budesonide-formoterol (SYMBICORT) 80-4.5 MCG/ACT inhaler Take 2 puffs first thing in am and then another 2 puffs about 12 hours later. 1 Inhaler 11  . empagliflozin (JARDIANCE) 25 MG TABS tablet Take 25 mg by mouth daily.    [name] glimepiride (AMARYL) 4 MG tablet Take 4 mg by mouth daily with breakfast.    . losartan (COZAAR) 100 MG tablet Take 1 tablet (100  mg total) by mouth daily. 30 tablet 1  . MELATONIN PO Take 1 tablet by mouth at bedtime as needed (sleep).     . metFORMIN (GLUCOPHAGE) 1000 MG tablet Take 1,000 mg by mouth 2 (two) times daily with a meal.    . omeprazole (PRILOSEC) 40 MG capsule Take 40 mg by mouth daily.     . travoprost, benzalkonium, (TRAVATAN) 0.004 % ophthalmic solution Place 1 drop into both eyes at bedtime.     No current facility-administered medications on file prior to visit.     PAST MEDICAL HISTORY: Past Medical History:  Diagnosis Date  . Anemia 2006   required transfusion post TAH/BSO 05/2005  . Arthritis   . Asthma   . Chronic cough   . Colon polyps    hyperplastic  . Diabetes mellitus without complication ([place])   . Diverticulosis   . GERD (gastroesophageal reflux disease)    on prilosec  . GLA deficiency ([place])   . Glaucoma of both eyes   . Hypertension   . Joint pain   . Osteoarthritis   . Pancreatitis   . Shortness of breath dyspnea     PAST SURGICAL HISTORY: Past Surgical History:  Procedure Laterality Date  . ABDOMINAL HYSTERECTOMY    . BREAST EXCISIONAL BIOPSY Right 1990  . COLONOSCOPY N/A 08/22/2013   Procedure: COLONOSCOPY;  Surgeon: Irene Shipper, MD;  Location[facility] ENDOSCOPY;  Service: Endoscopy;  Laterality: N/A;  . ESOPHAGOGASTRODUODENOSCOPY N/A 08/22/2013   Procedure: ESOPHAGOGASTRODUODENOSCOPY (EGD);  Surgeon: Irene Shipper, MD;  Location: [facility] ENDOSCOPY;  Service: Endoscopy;  Laterality: N/A;  . ESOPHAGOGASTRODUODENOSCOPY (EGD) WITH PROPOFOL N/A 08/19/2015   Procedure: ESOPHAGOGASTRODUODENOSCOPY (EGD) WITH PROPOFOL;  Surgeon: Irene Shipper, MD;  Location: [facility] ENDOSCOPY;  Service: Endoscopy;  Laterality: N/A;  . HERNIA REPAIR    . HYSTEROSCOPY W/D&C  01/2005   for uterine fibroids.   . INSERTION OF MESH N/A 08/24/2013   Procedure: INSERTION OF MESH;  Surgeon: Edward Jolly, MD;  Location: [place];  Service: General;  Laterality: N/A;  . JOINT REPLACEMENT Right   . LIPOMA EXCISION  Left 06/21/2015   Procedure: EXCISION OF LEFT SCALP LIPOMA;  Surgeon: Johnathan Hausen, MD;  Location: [place];  Service: General;  Laterality: Left;  . PANNICULECTOMY N/A 08/24/2013   Procedure: PANNICULECTOMY;  Surgeon: Edward Jolly, MD;  Location: [place];  Service: General;  Laterality: N/A;  . TOTAL ABDOMINAL HYSTERECTOMY W/ BILATERAL SALPINGOOPHORECTOMY  05/2005  . TOTAL HIP ARTHROPLASTY Right 12/12/2013   Procedure: RIGHT TOTAL HIP ARTHROPLASTY ANTERIOR APPROACH;  Surgeon: Mcarthur Rossetti, MD;  Location: [place];  Service: Orthopedics;  Laterality: Right;  . VENTRAL HERNIA REPAIR N/A 08/24/2013   Procedure: HERNIA REPAIR VENTRAL ADULT;  Surgeon: Edward Jolly, MD;  Location: [place];  Service: General;  Laterality: N/A;    SOCIAL HISTORY: Social History   Tobacco Use  . Smoking status: Former Smoker    Packs/day: 0.25    Years: 15.00  Pack years: 3.75    Types: Cigarettes    Last attempt to quit: 06/22/1978    Years since quitting: 39.1  . Smokeless tobacco: Never Used  Substance Use Topics  . Alcohol use: No    Alcohol/week: 0.0 oz  . Drug use: No    FAMILY HISTORY: Family History  Problem Relation Age of Onset  . Emphysema Mother        smoked  . Diabetes Mother   . Hypertension Mother   . Colon cancer Father        7-s  . Hypertension Father   . Heart disease Father   . Breast cancer Sister     ROS: Review of Systems  Constitutional: Positive for malaise/fatigue.  HENT:       Hay Fever Dentures  Eyes:       Wear Glasses or Contacts  Respiratory: Negative for shortness of breath (on exertion).   Cardiovascular: Negative for orthopnea.  Neurological: Positive for headaches.  Psychiatric/Behavioral: The patient has insomnia.     PHYSICAL EXAM: Blood pressure 131/86, pulse (!) 59, temperature 97.7 F (36.5 C), temperature source Oral, height '5\' 2"'  (1.575 m), weight 204 lb (92.5 kg), SpO2 99 %. Body mass index is 37.31  kg/m. Physical Exam  Constitutional: She is oriented to person, place, and time. She appears well-developed and well-nourished.  HENT:  Head: Normocephalic and atraumatic.  Nose: Nose normal.  Eyes: EOM are normal. No scleral icterus.  Neck: Normal range of motion. Neck supple. No thyromegaly present.  Cardiovascular: Regular rhythm. Bradycardia present.  Pulmonary/Chest: Effort normal. No respiratory distress.  Abdominal: Soft. There is no tenderness.  + obesity  Musculoskeletal: Normal range of motion.  Range of Motion normal in all 4 extremities  Neurological: She is alert and oriented to person, place, and time. Coordination normal.  Skin: Skin is warm and dry.  Psychiatric: She has a normal mood and affect. Her behavior is normal.  Vitals reviewed.   RECENT LABS AND TESTS: BMET    Component Value Date/Time   NA 142 08/16/2015 0353   K 4.0 08/16/2015 0353   CL 110 08/16/2015 0353   CO2 22 08/16/2015 0353   GLUCOSE 91 08/16/2015 0353   BUN 7 08/16/2015 0353   CREATININE 0.77 08/21/2015 0332   CALCIUM 8.5 (L) 08/16/2015 0353   GFRNONAA >60 08/21/2015 0332   GFRAA >60 08/21/2015 0332   No results found for: HGBA1C No results found for: INSULIN CBC    Component Value Date/Time   WBC 5.5 08/21/2015 0332   RBC 4.12 08/21/2015 0332   HGB 11.9 (L) 08/21/2015 0332   HCT 35.5 (L) 08/21/2015 0332   PLT 376 08/21/2015 0332   MCV 86.2 08/21/2015 0332   MCH 28.9 08/21/2015 0332   MCHC 33.5 08/21/2015 0332   RDW 13.9 08/21/2015 0332   LYMPHSABS 1.9 08/21/2015 0332   MONOABS 0.6 08/21/2015 0332   EOSABS 0.1 08/21/2015 0332   BASOSABS 0.0 08/21/2015 0332   Iron/TIBC/Ferritin/ %Sat No results found for: IRON, TIBC, FERRITIN, IRONPCTSAT Lipid Panel     Component Value Date/Time   CHOL 120 08/14/2015 0417   TRIG 64 08/14/2015 0417   HDL 43 08/14/2015 0417   CHOLHDL 2.8 08/14/2015 0417   VLDL 13 08/14/2015 0417   LDLCALC 64 08/14/2015 0417   Hepatic Function Panel      Component Value Date/Time   PROT 6.6 08/16/2015 0353   ALBUMIN 3.3 (L) 08/16/2015 0353   AST 12 (L) 08/16/2015 9476  ALT 13 (L) 08/16/2015 0353   ALKPHOS 62 08/16/2015 0353   BILITOT 0.6 08/16/2015 0353   No results found for: TSH Vitamin D There are no recent results  ECG  shows NSR with a rate of 54 BPM INDIRECT CALORIMETER done today shows a VO2 of 221 and a REE of 1536. Her calculated basal metabolic rate is 5329 thus her basal metabolic rate is worse than expected.    ASSESSMENT AND PLAN: Other fatigue - Plan: EKG 12-Lead, Vitamin B12, CBC With Differential, Folate, Lipid Panel With LDL/HDL Ratio, T3, T4, free, TSH, VITAMIN D 25 Hydroxy (Vit-D Deficiency, Fractures)  Shortness of breath on exertion - Plan: Lipid Panel With LDL/HDL Ratio  Type 2 diabetes mellitus without complication, without long-term current use of insulin (HCC) - Plan: Comprehensive metabolic panel, Hemoglobin A1c, Insulin, random, Microalbumin / creatinine urine ratio, glucose blood test strip, ONE TOUCH LANCETS MISC, blood glucose meter kit and supplies KIT  Depression screening  Class 2 severe obesity with serious comorbidity and body mass index (BMI) of 37.0 to 37.9 in adult, unspecified obesity type (HCC)  PLAN:  Fatigue Marticia was informed that her fatigue may be related to obesity, depression or many other causes. Labs will be ordered, and in the meanwhile Monice has agreed to work on diet, exercise and weight loss to help with fatigue. Proper sleep hygiene was discussed including the need for 7-8 hours of quality sleep each night. A sleep study was not ordered based on symptoms and Epworth score.  Dyspnea on exertion Maryssa's shortness of breath appears to be obesity related and exercise induced. She has agreed to work on weight loss and gradually increase exercise to treat her exercise induced shortness of breath. If Kaena follows our instructions and loses weight without improvement of her  shortness of breath, we will plan to refer to pulmonology. We will monitor this condition regularly. Jaquelin agrees to this plan.  Diabetes II Ely has been given extensive diabetes education by myself today including ideal fasting and post-prandial blood glucose readings, individual ideal Hgb A1c goals and hypoglycemia prevention. We discussed the importance of good blood sugar control to decrease the likelihood of diabetic complications such as nephropathy, neuropathy, limb loss, blindness, coronary artery disease, and death. We discussed the importance of intensive lifestyle modification including diet, exercise and weight loss as the first line treatment for diabetes. She agrees to check her blood sugar 2 times daily and bring in her blood sugar log. Graylyn agrees to continue her diabetes medications as prescribed. Prescription was written today for meter, strips and lancets. Jaelle agrees to follow up with our clinic in 2 weeks.  Depression Screen Tyffany had a mildly positive depression screening. Depression is commonly associated with obesity and often results in emotional eating behaviors. We will monitor this closely and work on CBT to help improve the non-hunger eating patterns. Referral to Psychology may be required if no improvement is seen as she continues in our clinic.  Obesity Jeslynn is currently in the action stage of change and her goal is to continue with weight loss efforts She has agreed to follow the Category 2 plan Bambie has been instructed to work up to a goal of 150 minutes of combined cardio and strengthening exercise per week for weight loss and overall health benefits. We discussed the following Behavioral Modification Strategies today: increasing lean protein intake, decreasing simple carbohydrates  and decrease eating out  Aminah has agreed to follow up with our clinic in 2 weeks.  She was informed of the importance of frequent follow up visits to maximize her success with  intensive lifestyle modifications for her multiple health conditions. She was informed we would discuss her lab results at her next visit unless there is a critical issue that needs to be addressed sooner. Megham agreed to keep her next visit at the agreed upon time to discuss these results.    OBESITY BEHAVIORAL INTERVENTION VISIT  Today's visit was # 1 out of 22.  Starting weight: 204 lbs Starting date: 08/02/17 Today's weight : 204 lbs  Today's date: 08/02/2017 Total lbs lost to date: 0 (Patients must lose 7 lbs in the first 6 months to continue with counseling)   ASK: We discussed the diagnosis of obesity with Lucille Passy today and Dezyre agreed to give [name] permission to discuss obesity behavioral modification therapy today.  ASSESS: Cesilia has the diagnosis of obesity and her BMI today is 37.3 Landree is in the action stage of change   ADVISE: Chace was educated on the multiple health risks of obesity as well as the benefit of weight loss to improve her health. She was advised of the need for long term treatment and the importance of lifestyle modifications.  AGREE: Multiple dietary modification options and treatment options were discussed and  Tecia agreed to the above obesity treatment plan.   I, Doreene Nest, am acting as transcriptionist for Dennard Nip, MD   I have reviewed the above documentation for accuracy and completeness, and I agree with the above. -Dennard Nip, MD

## 2017-08-03 LAB — COMPREHENSIVE METABOLIC PANEL
ALT: 18 IU/L (ref 0–32)
AST: 15 IU/L (ref 0–40)
Albumin/Globulin Ratio: 1.8 (ref 1.2–2.2)
Albumin: 4.4 g/dL (ref 3.6–4.8)
Alkaline Phosphatase: 101 IU/L (ref 39–117)
BUN/Creatinine Ratio: 15 (ref 12–28)
BUN: 14 mg/dL (ref 8–27)
Bilirubin Total: 0.3 mg/dL (ref 0.0–1.2)
CO2: 24 mmol/L (ref 20–29)
Calcium: 9.9 mg/dL (ref 8.7–10.3)
Chloride: 102 mmol/L (ref 96–106)
Creatinine, Ser: 0.94 mg/dL (ref 0.57–1.00)
GFR calc Af Amer: 76 mL/min/{1.73_m2} (ref >59)
GFR calc non Af Amer: 66 mL/min/{1.73_m2} (ref >59)
Globulin, Total: 2.5 g/dL (ref 1.5–4.5)
Glucose: 120 mg/dL — ABNORMAL HIGH (ref 65–99)
Potassium: 4.8 mmol/L (ref 3.5–5.2)
Sodium: 141 mmol/L (ref 134–144)
Total Protein: 6.9 g/dL (ref 6.0–8.5)

## 2017-08-03 LAB — MICROALBUMIN / CREATININE URINE RATIO
Creatinine, Urine: 71.6 mg/dL
Microalb/Creat Ratio: <4.2 mg/g creat (ref 0.0–30.0)
Microalbumin, Urine: <3 ug/mL

## 2017-08-03 LAB — CBC WITH DIFFERENTIAL
Basophils Absolute: 0 10*3/uL (ref 0.0–0.2)
Basos: 1 %
EOS (ABSOLUTE): 0.2 10*3/uL (ref 0.0–0.4)
Eos: 4 %
Hematocrit: 40.7 % (ref 34.0–46.6)
Hemoglobin: 13.4 g/dL (ref 11.1–15.9)
Immature Grans (Abs): 0 10*3/uL (ref 0.0–0.1)
Immature Granulocytes: 0 %
Lymphocytes Absolute: 2.2 10*3/uL (ref 0.7–3.1)
Lymphs: 39 %
MCH: 28.2 pg (ref 26.6–33.0)
MCHC: 32.9 g/dL (ref 31.5–35.7)
MCV: 86 fL (ref 79–97)
Monocytes Absolute: 0.3 10*3/uL (ref 0.1–0.9)
Monocytes: 6 %
Neutrophils Absolute: 2.9 10*3/uL (ref 1.4–7.0)
Neutrophils: 50 %
RBC: 4.76 x10E6/uL (ref 3.77–5.28)
RDW: 14.9 % (ref 12.3–15.4)
WBC: 5.7 10*3/uL (ref 3.4–10.8)

## 2017-08-03 LAB — INSULIN, RANDOM: INSULIN: 17.4 u[IU]/mL (ref 2.6–24.9)

## 2017-08-03 LAB — TSH: TSH: 2 u[IU]/mL (ref 0.450–4.500)

## 2017-08-03 LAB — VITAMIN D 25 HYDROXY (VIT D DEFICIENCY, FRACTURES): Vit D, 25-Hydroxy: 17.9 ng/mL — ABNORMAL LOW (ref 30.0–100.0)

## 2017-08-03 LAB — HEMOGLOBIN A1C
Est. average glucose Bld gHb Est-mCnc: 148 mg/dL
Hgb A1c MFr Bld: 6.8 % — ABNORMAL HIGH (ref 4.8–5.6)

## 2017-08-03 LAB — VITAMIN B12: Vitamin B-12: 1226 pg/mL (ref 232–1245)

## 2017-08-03 LAB — LIPID PANEL WITH LDL/HDL RATIO
Cholesterol, Total: 135 mg/dL (ref 100–199)
HDL: 54 mg/dL (ref >39)
LDL Calculated: 68 mg/dL (ref 0–99)
LDl/HDL Ratio: 1.3 ratio (ref 0.0–3.2)
Triglycerides: 65 mg/dL (ref 0–149)
VLDL Cholesterol Cal: 13 mg/dL (ref 5–40)

## 2017-08-03 LAB — FOLATE: Folate: 13.1 ng/mL (ref >3.0)

## 2017-08-03 LAB — T3: T3, Total: 108 ng/dL (ref 71–180)

## 2017-08-03 LAB — T4, FREE: Free T4: 1.32 ng/dL (ref 0.82–1.77)

## 2017-08-16 ENCOUNTER — Ambulatory Visit (INDEPENDENT_AMBULATORY_CARE_PROVIDER_SITE_OTHER): Payer: BC Managed Care – PPO | Admitting: Family Medicine

## 2017-08-25 ENCOUNTER — Ambulatory Visit (INDEPENDENT_AMBULATORY_CARE_PROVIDER_SITE_OTHER): Payer: BC Managed Care – PPO | Admitting: Family Medicine

## 2017-08-25 VITALS — BP 117/83 | HR 61 | Temp 97.7°F | Ht 62.0 in | Wt 199.0 lb

## 2017-08-25 DIAGNOSIS — Z9189 Other specified personal risk factors, not elsewhere classified: Secondary | ICD-10-CM

## 2017-08-25 DIAGNOSIS — R0789 Other chest pain: Secondary | ICD-10-CM

## 2017-08-25 DIAGNOSIS — E559 Vitamin D deficiency, unspecified: Secondary | ICD-10-CM

## 2017-08-25 DIAGNOSIS — Z6836 Body mass index (BMI) 36.0-36.9, adult: Secondary | ICD-10-CM

## 2017-08-25 DIAGNOSIS — E119 Type 2 diabetes mellitus without complications: Secondary | ICD-10-CM

## 2017-08-25 MED ORDER — GLUCOSE BLOOD VI STRP: ORAL_STRIP | 1 refills | Status: AC

## 2017-08-25 MED ORDER — VITAMIN D (ERGOCALCIFEROL) 1.25 MG (50000 UNIT) PO CAPS: 50000.0000 [IU] | ORAL_CAPSULE | ORAL | 0 refills | Status: DC

## 2017-08-25 MED ORDER — GLUCOSE BLOOD VI STRP: ORAL_STRIP | 1 refills | Status: DC

## 2017-08-25 NOTE — Progress Notes (Signed)
Office: [phone]  /  Fax: [phone]   HPI:   Chief Complaint: OBESITY [name] is here to discuss her progress with her obesity treatment plan. She is on the Category 2 plan and is following her eating plan approximately 80 % of the time. She states she is exercising 0 minutes 0 times per week. Ray has done well with weight loss. She felt irritable the first week, stopping soda. Kaylon got bored after the first week and started to deviate. Her weight is 199 lb (90.3 kg) today and has had a weight loss of 5 pounds over a period of 3 weeks since her last visit. She has lost 5 lbs since starting treatment with [name].  Vitamin D deficiency [name] has a new diagnosis of vitamin D deficiency. Her vitamin D level is low and she admits fatigue. She is not currently taking vit D and denies nausea, vomiting or muscle weakness.   Ref. Range 08/02/2017 10:35  Vitamin D, 25-Hydroxy Latest Ref Range: 30.0 - 100.0 ng/mL 17.9 (L)   Diabetes II [name] has a diagnosis of diabetes type II. Sophronia states fasting BGs range between 100 and 172, mostly 120 t0 130's, 2 hour post prandial BGs range between 107 and 185 on Jardiance, Amaryl and Metformin. [name] denies any hypoglycemic episodes. She has been working on intensive lifestyle modifications including diet, exercise, and weight loss to help control her blood glucose levels.  At risk for cardiovascular disease [name] is at a higher than average risk for cardiovascular disease due to obesity and diabetes. She currently denies any chest pain.  Chest Pressure [name] notes increased chest pressure, worse in the last week, not worse with activity and non reproducible [name] states her GERD has worsened and was given  A prescription for Nexium, but has not started it yet.  ALLERGIES: Allergies  Allergen Reactions  . Diclofenac     Hives   . Penicillins Hives    Has patient had a PCN reaction causing immediate rash, facial/tongue/throat swelling, SOB or  lightheadedness with hypotension: Yes Has patient had a PCN reaction causing severe rash involving mucus membranes or skin necrosis: Yes Has patient had a PCN reaction that required hospitalization No Has patient had a PCN reaction occurring within the last 10 years: No. If all of the above answers are "NO", then may proceed with Cephalosporin use.     MEDICATIONS: Current Outpatient Medications on File Prior to Visit  Medication Sig Dispense Refill  . albuterol (PROVENTIL) (2.5 MG/3ML) 0.083% nebulizer solution Take 2.5 mg by nebulization every 6 (six) hours as needed.     Marland Kitchen amLODipine (NORVASC) 5 MG tablet Take 1 tablet (5 mg total) by mouth daily. 30 tablet 1  . aspirin 81 MG tablet Take 81 mg by mouth daily.    . blood glucose meter kit and supplies KIT Dispense based on patient and insurance preference. Use up to four times daily as directed. (FOR ICD-9 250.00, 250.01). 1 each 0  . budesonide-formoterol (SYMBICORT) 80-4.5 MCG/ACT inhaler Take 2 puffs first thing in am and then another 2 puffs about 12 hours later. 1 Inhaler 11  . empagliflozin (JARDIANCE) 25 MG TABS tablet Take 25 mg by mouth daily.    Marland Kitchen glimepiride (AMARYL) 4 MG tablet Take 4 mg by mouth daily with breakfast.    . losartan (COZAAR) 100 MG tablet Take 1 tablet (100 mg total) by mouth daily. 30 tablet 1  . MELATONIN PO Take 1 tablet by mouth at bedtime as needed (  sleep).     . metFORMIN (GLUCOPHAGE) 1000 MG tablet Take 1,000 mg by mouth 2 (two) times daily with a meal.    . omeprazole (PRILOSEC) 40 MG capsule Take 40 mg by mouth daily.     . ONE TOUCH LANCETS MISC 1 Package by Does not apply route every morning. 100 each 0  . travoprost, benzalkonium, (TRAVATAN) 0.004 % ophthalmic solution Place 1 drop into both eyes at bedtime.     No current facility-administered medications on file prior to visit.     PAST MEDICAL HISTORY: Past Medical History:  Diagnosis Date  . Anemia 2006   required transfusion post TAH/BSO  05/2005  . Arthritis   . Asthma   . Chronic cough   . Colon polyps    hyperplastic  . Diabetes mellitus without complication ([place])   . Diverticulosis   . GERD (gastroesophageal reflux disease)    on prilosec  . GLA deficiency ([place])   . Glaucoma of both eyes   . Hypertension   . Joint pain   . Osteoarthritis   . Pancreatitis   . Shortness of breath dyspnea     PAST SURGICAL HISTORY: Past Surgical History:  Procedure Laterality Date  . ABDOMINAL HYSTERECTOMY    . BREAST EXCISIONAL BIOPSY Right 1990  . COLONOSCOPY N/A 08/22/2013   Procedure: COLONOSCOPY;  Surgeon: Irene Shipper, MD;  Location: [facility] ENDOSCOPY;  Service: Endoscopy;  Laterality: N/A;  . ESOPHAGOGASTRODUODENOSCOPY N/A 08/22/2013   Procedure: ESOPHAGOGASTRODUODENOSCOPY (EGD);  Surgeon: Irene Shipper, MD;  Location[facility] ENDOSCOPY;  Service: Endoscopy;  Laterality: N/A;  . ESOPHAGOGASTRODUODENOSCOPY (EGD) WITH PROPOFOL N/A 08/19/2015   Procedure: ESOPHAGOGASTRODUODENOSCOPY (EGD) WITH PROPOFOL;  Surgeon: Irene Shipper, MD;  Location: [facility] ENDOSCOPY;  Service: Endoscopy;  Laterality: N/A;  . HERNIA REPAIR    . HYSTEROSCOPY W/D&C  01/2005   for uterine fibroids.   . INSERTION OF MESH N/A 08/24/2013   Procedure: INSERTION OF MESH;  Surgeon: Edward Jolly, MD;  Location: [place];  Service: General;  Laterality: N/A;  . JOINT REPLACEMENT Right   . LIPOMA EXCISION Left 06/21/2015   Procedure: EXCISION OF LEFT SCALP LIPOMA;  Surgeon: Johnathan Hausen, MD;  Location: [place];  Service: General;  Laterality: Left;  . PANNICULECTOMY N/A 08/24/2013   Procedure: PANNICULECTOMY;  Surgeon: Edward Jolly, MD;  Location: [place];  Service: General;  Laterality: N/A;  . TOTAL ABDOMINAL HYSTERECTOMY W/ BILATERAL SALPINGOOPHORECTOMY  05/2005  . TOTAL HIP ARTHROPLASTY Right 12/12/2013   Procedure: RIGHT TOTAL HIP ARTHROPLASTY ANTERIOR APPROACH;  Surgeon: Mcarthur Rossetti, MD;  Location: [place];  Service: Orthopedics;   Laterality: Right;  . VENTRAL HERNIA REPAIR N/A 08/24/2013   Procedure: HERNIA REPAIR VENTRAL ADULT;  Surgeon: Edward Jolly, MD;  Location: [place];  Service: General;  Laterality: N/A;    SOCIAL HISTORY: Social History   Tobacco Use  . Smoking status: Former Smoker    Packs/day: 0.25    Years: 15.00    Pack years: 3.75    Types: Cigarettes    Last attempt to quit: 06/22/1978    Years since quitting: 39.2  . Smokeless tobacco: Never Used  Substance Use Topics  . Alcohol use: No    Alcohol/week: 0.0 oz  . Drug use: No    FAMILY HISTORY: Family History  Problem Relation Age of Onset  . Emphysema Mother        smoked  . Diabetes Mother   . Hypertension Mother   .  Colon cancer Father        7-s  . Hypertension Father   . Heart disease Father   . Breast cancer Sister     ROS: Review of Systems  Constitutional: Positive for malaise/fatigue and weight loss.  Cardiovascular: Negative for chest pain.       Positive for chest pressure  Gastrointestinal: Negative for nausea and vomiting.  Musculoskeletal:       Negative for muscle weakness  Endo/Heme/Allergies:       Negative for hypoglycemia    PHYSICAL EXAM: Blood pressure 117/83, pulse 61, temperature 97.7 F (36.5 C), temperature source Oral, height '5\' 2"'  (1.575 m), weight 199 lb (90.3 kg), SpO2 98 %. Body mass index is 36.4 kg/m. Physical Exam  Constitutional: She is oriented to person, place, and time. She appears well-developed and well-nourished.  Cardiovascular: Normal rate.  Pulmonary/Chest: Breath sounds normal.  Musculoskeletal: Normal range of motion.  Neurological: She is oriented to person, place, and time.  Skin: Skin is warm and dry.  Psychiatric: She has a normal mood and affect. Her behavior is normal.  Vitals reviewed.   RECENT LABS AND TESTS: BMET    Component Value Date/Time   NA 141 08/02/2017 1035   K 4.8 08/02/2017 1035   CL 102 08/02/2017 1035   CO2 24 08/02/2017 1035   GLUCOSE  120 (H) 08/02/2017 1035   GLUCOSE 91 08/16/2015 0353   BUN 14 08/02/2017 1035   CREATININE 0.94 08/02/2017 1035   CALCIUM 9.9 08/02/2017 1035   GFRNONAA 66 08/02/2017 1035   GFRAA 76 08/02/2017 1035   Lab Results  Component Value Date   HGBA1C 6.8 (H) 08/02/2017   Lab Results  Component Value Date   INSULIN 17.4 08/02/2017   CBC    Component Value Date/Time   WBC 5.7 08/02/2017 1035   WBC 5.5 08/21/2015 0332   RBC 4.76 08/02/2017 1035   RBC 4.12 08/21/2015 0332   HGB 13.4 08/02/2017 1035   HCT 40.7 08/02/2017 1035   PLT 376 08/21/2015 0332   MCV 86 08/02/2017 1035   MCH 28.2 08/02/2017 1035   MCH 28.9 08/21/2015 0332   MCHC 32.9 08/02/2017 1035   MCHC 33.5 08/21/2015 0332   RDW 14.9 08/02/2017 1035   LYMPHSABS 2.2 08/02/2017 1035   MONOABS 0.6 08/21/2015 0332   EOSABS 0.2 08/02/2017 1035   BASOSABS 0.0 08/02/2017 1035   Iron/TIBC/Ferritin/ %Sat No results found for: IRON, TIBC, FERRITIN, IRONPCTSAT Lipid Panel     Component Value Date/Time   CHOL 135 08/02/2017 1035   TRIG 65 08/02/2017 1035   HDL 54 08/02/2017 1035   CHOLHDL 2.8 08/14/2015 0417   VLDL 13 08/14/2015 0417   LDLCALC 68 08/02/2017 1035   Hepatic Function Panel     Component Value Date/Time   PROT 6.9 08/02/2017 1035   ALBUMIN 4.4 08/02/2017 1035   AST 15 08/02/2017 1035   ALT 18 08/02/2017 1035   ALKPHOS 101 08/02/2017 1035   BILITOT 0.3 08/02/2017 1035      Component Value Date/Time   TSH 2.000 08/02/2017 1035    Ref. Range 08/02/2017 10:35  Vitamin D, 25-Hydroxy Latest Ref Range: 30.0 - 100.0 ng/mL 17.9 (L)   ASSESSMENT AND PLAN: Type 2 diabetes mellitus without complication, without long-term current use of insulin (HCC) - Plan: glucose blood test strip, glucose blood test strip  Vitamin D deficiency - Plan: Vitamin D, Ergocalciferol, (DRISDOL) 50000 units CAPS capsule  Chest pressure - Plan: EKG 12-Lead  At  risk for heart disease  Class 2 severe obesity with serious  comorbidity and body mass index (BMI) of 36.0 to 36.9 in adult, unspecified obesity type ([place])  PLAN:  Vitamin D Deficiency [name] was informed that low vitamin D levels contributes to fatigue and are associated with obesity, breast, and colon cancer. She agrees to start to take prescription Vit D '@50' ,000 IU every week #4 with no refills and will follow up for routine testing of vitamin D, at least 2-3 times per year. She was informed of the risk of over-replacement of vitamin D and agrees to not increase her dose unless she discusses this with [name] first. [name] agrees to follow up with our clinic in 2 weeks.  Diabetes II [name] has been given extensive diabetes education by myself today including ideal fasting and post-prandial blood glucose readings, individual ideal Hgb A1c goals and hypoglycemia prevention. We discussed the importance of good blood sugar control to decrease the likelihood of diabetic complications such as nephropathy, neuropathy, limb loss, blindness, coronary artery disease, and death. We discussed the importance of intensive lifestyle modification including diet, exercise and weight loss as the first line treatment for diabetes. [name] agrees to continue her diabetes medications and will follow up at the agreed upon time. We will refill one touch verio test strips.  Cardiovascular risk counseling [name] was given extended (30 minutes) coronary artery disease prevention counseling today. She is 62 y.o. female and has risk factors for heart disease including obesity and diabetes. We discussed intensive lifestyle modifications today with an emphasis on specific weight loss instructions and strategies. Pt was also informed of the importance of increasing exercise and decreasing saturated fats to help prevent heart disease.  Chest Pressure EKG was essentially normal. There were no ST elevations. [name] was given a copy of her last 2 EKGs, and was advised to take her Nexium. If symptoms  worsen, she agrees to go to emergency department. If this fail to improve, she will see her PCP in 1 week.  Obesity [name] is currently in the action stage of change. As such, her goal is to continue with weight loss efforts She has agreed to follow the Category 2 plan [name] has been instructed to work up to a goal of 150 minutes of combined cardio and strengthening exercise per week for weight loss and overall health benefits. We discussed the following Behavioral Modification Strategies today: increasing lean protein intake, decreasing simple carbohydrates  and work on meal planning and easy cooking plans  [name] has agreed to follow up with our clinic in 2 weeks. She was informed of the importance of frequent follow up visits to maximize her success with intensive lifestyle modifications for her multiple health conditions.   OBESITY BEHAVIORAL INTERVENTION VISIT  Today's visit was # 2 out of 22.  Starting weight: 204 lbs Starting date: 08/02/17 Today's weight : 199 lbs Today's date: 08/25/2017 Total lbs lost to date: 5 (Patients must lose 7 lbs in the first 6 months to continue with counseling)   ASK: We discussed the diagnosis of obesity with [name] today and [name] agreed to give [name] permission to discuss obesity behavioral modification therapy today.  ASSESS: [name] has the diagnosis of obesity and her BMI today is 36.39 [name] is in the action stage of change   ADVISE: [name] was educated on the multiple health risks of obesity as well as the benefit of weight loss to improve her health. She was advised of the need for long  term treatment and the importance of lifestyle modifications.  AGREE: Multiple dietary modification options and treatment options were discussed and  [name] agreed to the above obesity treatment plan.  Corey Skains, am acting as transcriptionist for Dennard Nip, MD

## 2017-09-08 ENCOUNTER — Ambulatory Visit (INDEPENDENT_AMBULATORY_CARE_PROVIDER_SITE_OTHER): Payer: BC Managed Care – PPO | Admitting: Family Medicine

## 2017-09-15 ENCOUNTER — Ambulatory Visit (INDEPENDENT_AMBULATORY_CARE_PROVIDER_SITE_OTHER): Payer: BC Managed Care – PPO | Admitting: Physician Assistant

## 2017-09-15 VITALS — BP 125/83 | HR 61 | Temp 98.1°F

## 2017-09-15 DIAGNOSIS — Z6835 Body mass index (BMI) 35.0-35.9, adult: Secondary | ICD-10-CM | POA: Diagnosis not present

## 2017-09-15 DIAGNOSIS — E1165 Type 2 diabetes mellitus with hyperglycemia: Secondary | ICD-10-CM | POA: Diagnosis not present

## 2017-09-15 NOTE — Progress Notes (Signed)
Office: [phone]  /  Fax: [phone]   HPI:   Chief Complaint: OBESITY Haydon is here to discuss her progress with her obesity treatment plan. She is on the Category 2 plan and is following her eating plan approximately 50 % of the time. She states she is exercising 0 minutes 0 times per week. Kippy continue to do well with weight loss. She has been following the meal plan and she states her hunger is well controlled. She would like more snack ideas.  Her weight is (P) 196 lb (88.9 kg) today and has had a weight loss of 3 pounds over a period of 3 weeks since her last visit. She has lost 8 lbs since starting treatment with [name].  Diabetes II with Hyperglycemia Tawnia has a diagnosis of diabetes type II. Monserat states fasting BGs range between 80 and 170's. She had a fasting BGs reading in the 400's and upon repeat, it got back to 100. She denies any hypoglycemic episodes. Last A1c was 6.8 on 08/02/17. She has been working on intensive lifestyle modifications including diet, exercise, and weight loss to help control her blood glucose levels.  ALLERGIES: Allergies  Allergen Reactions  . Diclofenac     Hives   . Penicillins Hives    Has patient had a PCN reaction causing immediate rash, facial/tongue/throat swelling, SOB or lightheadedness with hypotension: Yes Has patient had a PCN reaction causing severe rash involving mucus membranes or skin necrosis: Yes Has patient had a PCN reaction that required hospitalization No Has patient had a PCN reaction occurring within the last 10 years: No. If all of the above answers are "NO", then may proceed with Cephalosporin use.     MEDICATIONS: Current Outpatient Medications on File Prior to Visit  Medication Sig Dispense Refill  . albuterol (PROVENTIL) (2.5 MG/3ML) 0.083% nebulizer solution Take 2.5 mg by nebulization every 6 (six) hours as needed.     Marland Kitchen amLODipine (NORVASC) 5 MG tablet Take 1 tablet (5 mg total) by mouth daily. 30 tablet 1    . aspirin 81 MG tablet Take 81 mg by mouth daily.    . blood glucose meter kit and supplies KIT Dispense based on patient and insurance preference. Use up to four times daily as directed. (FOR ICD-9 250.00, 250.01). 1 each 0  . budesonide-formoterol (SYMBICORT) 80-4.5 MCG/ACT inhaler Take 2 puffs first thing in am and then another 2 puffs about 12 hours later. 1 Inhaler 11  . empagliflozin (JARDIANCE) 25 MG TABS tablet Take 25 mg by mouth daily.    Marland Kitchen glimepiride (AMARYL) 4 MG tablet Take 4 mg by mouth daily with breakfast.    . glucose blood test strip Use as instructed 100 each 1  . losartan (COZAAR) 100 MG tablet Take 1 tablet (100 mg total) by mouth daily. 30 tablet 1  . MELATONIN PO Take 1 tablet by mouth at bedtime as needed (sleep).     . metFORMIN (GLUCOPHAGE) 1000 MG tablet Take 1,000 mg by mouth 2 (two) times daily with a meal.    . omeprazole (PRILOSEC) 40 MG capsule Take 40 mg by mouth daily.     . ONE TOUCH LANCETS MISC 1 Package by Does not apply route every morning. 100 each 0  . travoprost, benzalkonium, (TRAVATAN) 0.004 % ophthalmic solution Place 1 drop into both eyes at bedtime.    . Vitamin D, Ergocalciferol, (DRISDOL) 50000 units CAPS capsule Take 1 capsule (50,000 Units total) by mouth every 7 (seven) days. 4  capsule 0   No current facility-administered medications on file prior to visit.     PAST MEDICAL HISTORY: Past Medical History:  Diagnosis Date  . Anemia 2006   required transfusion post TAH/BSO 05/2005  . Arthritis   . Asthma   . Chronic cough   . Colon polyps    hyperplastic  . Diabetes mellitus without complication ([place])   . Diverticulosis   . GERD (gastroesophageal reflux disease)    on prilosec  . GLA deficiency ([place])   . Glaucoma of both eyes   . Hypertension   . Joint pain   . Osteoarthritis   . Pancreatitis   . Shortness of breath dyspnea     PAST SURGICAL HISTORY: Past Surgical History:  Procedure Laterality Date  . ABDOMINAL  HYSTERECTOMY    . BREAST EXCISIONAL BIOPSY Right 1990  . COLONOSCOPY N/A 08/22/2013   Procedure: COLONOSCOPY;  Surgeon: Irene Shipper, MD;  Location: [facility] ENDOSCOPY;  Service: Endoscopy;  Laterality: N/A;  . ESOPHAGOGASTRODUODENOSCOPY N/A 08/22/2013   Procedure: ESOPHAGOGASTRODUODENOSCOPY (EGD);  Surgeon: Irene Shipper, MD;  Location: [facility] ENDOSCOPY;  Service: Endoscopy;  Laterality: N/A;  . ESOPHAGOGASTRODUODENOSCOPY (EGD) WITH PROPOFOL N/A 08/19/2015   Procedure: ESOPHAGOGASTRODUODENOSCOPY (EGD) WITH PROPOFOL;  Surgeon: Irene Shipper, MD;  Location: [facility] ENDOSCOPY;  Service: Endoscopy;  Laterality: N/A;  . HERNIA REPAIR    . HYSTEROSCOPY W/D&C  01/2005   for uterine fibroids.   . INSERTION OF MESH N/A 08/24/2013   Procedure: INSERTION OF MESH;  Surgeon: Edward Jolly, MD;  Location: [place];  Service: General;  Laterality: N/A;  . JOINT REPLACEMENT Right   . LIPOMA EXCISION Left 06/21/2015   Procedure: EXCISION OF LEFT SCALP LIPOMA;  Surgeon: Johnathan Hausen, MD;  Location: [place];  Service: General;  Laterality: Left;  . PANNICULECTOMY N/A 08/24/2013   Procedure: PANNICULECTOMY;  Surgeon: Edward Jolly, MD;  Location: [place];  Service: General;  Laterality: N/A;  . TOTAL ABDOMINAL HYSTERECTOMY W/ BILATERAL SALPINGOOPHORECTOMY  05/2005  . TOTAL HIP ARTHROPLASTY Right 12/12/2013   Procedure: RIGHT TOTAL HIP ARTHROPLASTY ANTERIOR APPROACH;  Surgeon: Mcarthur Rossetti, MD;  Location: [place];  Service: Orthopedics;  Laterality: Right;  . VENTRAL HERNIA REPAIR N/A 08/24/2013   Procedure: HERNIA REPAIR VENTRAL ADULT;  Surgeon: Edward Jolly, MD;  Location: [place];  Service: General;  Laterality: N/A;    SOCIAL HISTORY: Social History   Tobacco Use  . Smoking status: Former Smoker    Packs/day: 0.25    Years: 15.00    Pack years: 3.75    Types: Cigarettes    Last attempt to quit: 06/22/1978    Years since quitting: 39.2  . Smokeless tobacco: Never Used  Substance Use  Topics  . Alcohol use: No    Alcohol/week: 0.0 oz  . Drug use: No    FAMILY HISTORY: Family History  Problem Relation Age of Onset  . Emphysema Mother        smoked  . Diabetes Mother   . Hypertension Mother   . Colon cancer Father        7-s  . Hypertension Father   . Heart disease Father   . Breast cancer Sister     ROS: Review of Systems  Constitutional: Positive for weight loss.  Endo/Heme/Allergies:       Negative hypoglycemia    PHYSICAL EXAM: Blood pressure 125/83, pulse 61, temperature 98.1 F (36.7 C), temperature source Oral, height (P) '5\' 2"'  (1.575 m),  weight (P) 196 lb (88.9 kg), SpO2 100 %. Body mass index is 35.85 kg/m (pended). Physical Exam  Constitutional: She is oriented to person, place, and time. She appears well-developed and well-nourished.  Cardiovascular: Normal rate.  Pulmonary/Chest: Effort normal.  Musculoskeletal: Normal range of motion.  Neurological: She is oriented to person, place, and time.  Skin: Skin is warm and dry.  Psychiatric: She has a normal mood and affect. Her behavior is normal.  Vitals reviewed.   RECENT LABS AND TESTS: BMET    Component Value Date/Time   NA 141 08/02/2017 1035   K 4.8 08/02/2017 1035   CL 102 08/02/2017 1035   CO2 24 08/02/2017 1035   GLUCOSE 120 (H) 08/02/2017 1035   GLUCOSE 91 08/16/2015 0353   BUN 14 08/02/2017 1035   CREATININE 0.94 08/02/2017 1035   CALCIUM 9.9 08/02/2017 1035   GFRNONAA 66 08/02/2017 1035   GFRAA 76 08/02/2017 1035   Lab Results  Component Value Date   HGBA1C 6.8 (H) 08/02/2017   Lab Results  Component Value Date   INSULIN 17.4 08/02/2017   CBC    Component Value Date/Time   WBC 5.7 08/02/2017 1035   WBC 5.5 08/21/2015 0332   RBC 4.76 08/02/2017 1035   RBC 4.12 08/21/2015 0332   HGB 13.4 08/02/2017 1035   HCT 40.7 08/02/2017 1035   PLT 376 08/21/2015 0332   MCV 86 08/02/2017 1035   MCH 28.2 08/02/2017 1035   MCH 28.9 08/21/2015 0332   MCHC 32.9  08/02/2017 1035   MCHC 33.5 08/21/2015 0332   RDW 14.9 08/02/2017 1035   LYMPHSABS 2.2 08/02/2017 1035   MONOABS 0.6 08/21/2015 0332   EOSABS 0.2 08/02/2017 1035   BASOSABS 0.0 08/02/2017 1035   Iron/TIBC/Ferritin/ %Sat No results found for: IRON, TIBC, FERRITIN, IRONPCTSAT Lipid Panel     Component Value Date/Time   CHOL 135 08/02/2017 1035   TRIG 65 08/02/2017 1035   HDL 54 08/02/2017 1035   CHOLHDL 2.8 08/14/2015 0417   VLDL 13 08/14/2015 0417   LDLCALC 68 08/02/2017 1035   Hepatic Function Panel     Component Value Date/Time   PROT 6.9 08/02/2017 1035   ALBUMIN 4.4 08/02/2017 1035   AST 15 08/02/2017 1035   ALT 18 08/02/2017 1035   ALKPHOS 101 08/02/2017 1035   BILITOT 0.3 08/02/2017 1035      Component Value Date/Time   TSH 2.000 08/02/2017 1035    ASSESSMENT AND PLAN: Type 2 diabetes mellitus with hyperglycemia, without long-term current use of insulin (HCC)  Class 2 severe obesity with serious comorbidity and body mass index (BMI) of 35.0 to 35.9 in adult, unspecified obesity type ([place])  PLAN:  Diabetes II with Hyperglycemia Omega has been given extensive diabetes education by myself today including ideal fasting and post-prandial blood glucose readings, individual ideal Hgb A1c goals and hypoglycemia prevention. We discussed the importance of good blood sugar control to decrease the likelihood of diabetic complications such as nephropathy, neuropathy, limb loss, blindness, coronary artery disease, and death. We discussed the importance of intensive lifestyle modification including diet, exercise and weight loss as the first line treatment for diabetes. Aoife agrees to continue her diabetes medications and she agrees to follow up with our clinic in 2 weeks.  We spent > than 50% of the 15 minute visit on the counseling as documented in the note.  Obesity Arayla is currently in the action stage of change. As such, her goal is to continue with weight  loss  efforts She has agreed to follow the Category 2 plan Silvia has been instructed to work up to a goal of 150 minutes of combined cardio and strengthening exercise per week for weight loss and overall health benefits. We discussed the following Behavioral Modification Strategies today: increasing lean protein intake and work on meal planning and easy cooking plans   Devynn has agreed to follow up with our clinic in 2 weeks. She was informed of the importance of frequent follow up visits to maximize her success with intensive lifestyle modifications for her multiple health conditions.   OBESITY BEHAVIORAL INTERVENTION VISIT  Today's visit was # 3 out of 22.  Starting weight: 204 lbs Starting date: 08/02/17 Today's weight : 196 lbs Today's date: 09/15/2017 Total lbs lost to date: 8 (Patients must lose 7 lbs in the first 6 months to continue with counseling)   ASK: We discussed the diagnosis of obesity with Lucille Passy today and Shavy agreed to give [name] permission to discuss obesity behavioral modification therapy today.  ASSESS: Miya has the diagnosis of obesity and her BMI today is 35.9 Chesni is in the action stage of change   ADVISE: Denette was educated on the multiple health risks of obesity as well as the benefit of weight loss to improve her health. She was advised of the need for long term treatment and the importance of lifestyle modifications.  AGREE: Multiple dietary modification options and treatment options were discussed and  Falana agreed to the above obesity treatment plan.   Wilhemena Durie, am acting as transcriptionist for Lacy Duverney, PA-C I, Lacy Duverney [facility], have reviewed this note and agree with its content

## 2017-09-29 ENCOUNTER — Ambulatory Visit (INDEPENDENT_AMBULATORY_CARE_PROVIDER_SITE_OTHER): Payer: BC Managed Care – PPO | Admitting: Family Medicine

## 2017-10-14 ENCOUNTER — Ambulatory Visit (INDEPENDENT_AMBULATORY_CARE_PROVIDER_SITE_OTHER): Payer: BC Managed Care – PPO | Admitting: Family Medicine

## 2017-10-14 VITALS — BP 126/83 | HR 59 | Temp 97.5°F | Ht 62.0 in | Wt 196.0 lb

## 2017-10-14 DIAGNOSIS — Z6835 Body mass index (BMI) 35.0-35.9, adult: Secondary | ICD-10-CM | POA: Diagnosis not present

## 2017-10-14 DIAGNOSIS — F3289 Other specified depressive episodes: Secondary | ICD-10-CM | POA: Diagnosis not present

## 2017-10-14 DIAGNOSIS — E559 Vitamin D deficiency, unspecified: Secondary | ICD-10-CM

## 2017-10-14 DIAGNOSIS — Z9189 Other specified personal risk factors, not elsewhere classified: Secondary | ICD-10-CM

## 2017-10-14 MED ORDER — VITAMIN D (ERGOCALCIFEROL) 1.25 MG (50000 UNIT) PO CAPS: 50000.0000 [IU] | ORAL_CAPSULE | ORAL | 0 refills | Status: DC

## 2017-10-18 NOTE — Progress Notes (Signed)
Office: [phone]  /  Fax: [phone]   HPI:   Chief Complaint: OBESITY [name] is here to discuss her progress with her obesity treatment plan. She is on the Category 2 plan and is following her eating plan approximately 20 % of the time. She states she is exercising 0 minutes 0 times per week. [name] states she has not been concentrating on weight loss recently. She has not done well with planning and has skipped more meals. She states she is ready to get back on track. Her weight is 196 lb (88.9 kg) today and has maintained weight over a period of 4 weeks since her last visit. She has lost 8 lbs since starting treatment with [name].  Vitamin D deficiency [name] has a diagnosis of vitamin D deficiency. [name] is stable on vit D, but she is not yet at goal. She denies nausea, vomiting or muscle weakness.  At risk for osteopenia and osteoporosis [name] is at higher risk of osteopenia and osteoporosis due to vitamin D deficiency.   Depression with emotional eating behaviors [name]'s mood is depressed. She has had increased family stressors and she is tearful in the office today. She is not concentrating on her health lately, but feels she is ready to get back to healthy eating and actions. [name] struggles with emotional eating and using food for comfort to the extent that it is negatively impacting her health. She often snacks when she is not hungry. [name] sometimes feels she is out of control and then feels guilty that she made poor food choices. She has been working on behavior modification techniques to help reduce her emotional eating and has been somewhat successful. She shows no sign of suicidal or homicidal ideations.  Depression screen PHQ 2/9 08/02/2017  Decreased Interest 0  Down, Depressed, Hopeless 1  PHQ - 2 Score 1  Altered sleeping 2  Tired, decreased energy 2  Change in appetite 2  Feeling bad or failure about yourself  0  Trouble concentrating 1  Moving slowly or  fidgety/restless 0  Suicidal thoughts 0  PHQ-9 Score 8  Difficult doing work/chores Not difficult at all      ALLERGIES: Allergies  Allergen Reactions  . Diclofenac     Hives   . Penicillins Hives    Has patient had a PCN reaction causing immediate rash, facial/tongue/throat swelling, SOB or lightheadedness with hypotension: Yes Has patient had a PCN reaction causing severe rash involving mucus membranes or skin necrosis: Yes Has patient had a PCN reaction that required hospitalization No Has patient had a PCN reaction occurring within the last 10 years: No. If all of the above answers are "NO", then may proceed with Cephalosporin use.     MEDICATIONS: Current Outpatient Medications on File Prior to Visit  Medication Sig Dispense Refill  . albuterol (PROVENTIL) (2.5 MG/3ML) 0.083% nebulizer solution Take 2.5 mg by nebulization every 6 (six) hours as needed.     Marland Kitchen amLODipine (NORVASC) 5 MG tablet Take 1 tablet (5 mg total) by mouth daily. 30 tablet 1  . aspirin 81 MG tablet Take 81 mg by mouth daily.    . blood glucose meter kit and supplies KIT Dispense based on patient and insurance preference. Use up to four times daily as directed. (FOR ICD-9 250.00, 250.01). 1 each 0  . budesonide-formoterol (SYMBICORT) 80-4.5 MCG/ACT inhaler Take 2 puffs first thing in am and then another 2 puffs about 12 hours later. 1 Inhaler 11  . empagliflozin (JARDIANCE) 25 MG  TABS tablet Take 25 mg by mouth daily.    Marland Kitchen glimepiride (AMARYL) 4 MG tablet Take 4 mg by mouth daily with breakfast.    . glucose blood test strip Use as instructed 100 each 1  . losartan (COZAAR) 100 MG tablet Take 1 tablet (100 mg total) by mouth daily. 30 tablet 1  . MELATONIN PO Take 1 tablet by mouth at bedtime as needed (sleep).     . metFORMIN (GLUCOPHAGE) 1000 MG tablet Take 1,000 mg by mouth 2 (two) times daily with a meal.    . omeprazole (PRILOSEC) 40 MG capsule Take 40 mg by mouth daily.     . ONE TOUCH LANCETS MISC 1  Package by Does not apply route every morning. 100 each 0  . travoprost, benzalkonium, (TRAVATAN) 0.004 % ophthalmic solution Place 1 drop into both eyes at bedtime.     No current facility-administered medications on file prior to visit.     PAST MEDICAL HISTORY: Past Medical History:  Diagnosis Date  . Anemia 2006   required transfusion post TAH/BSO 05/2005  . Arthritis   . Asthma   . Chronic cough   . Colon polyps    hyperplastic  . Diabetes mellitus without complication ([place])   . Diverticulosis   . GERD (gastroesophageal reflux disease)    on prilosec  . GLA deficiency ([place])   . Glaucoma of both eyes   . Hypertension   . Joint pain   . Osteoarthritis   . Pancreatitis   . Shortness of breath dyspnea     PAST SURGICAL HISTORY: Past Surgical History:  Procedure Laterality Date  . ABDOMINAL HYSTERECTOMY    . BREAST EXCISIONAL BIOPSY Right 1990  . COLONOSCOPY N/A 08/22/2013   Procedure: COLONOSCOPY;  Surgeon: Irene Shipper, MD;  Location: [facility] ENDOSCOPY;  Service: Endoscopy;  Laterality: N/A;  . ESOPHAGOGASTRODUODENOSCOPY N/A 08/22/2013   Procedure: ESOPHAGOGASTRODUODENOSCOPY (EGD);  Surgeon: Irene Shipper, MD;  Location: [facility] ENDOSCOPY;  Service: Endoscopy;  Laterality: N/A;  . ESOPHAGOGASTRODUODENOSCOPY (EGD) WITH PROPOFOL N/A 08/19/2015   Procedure: ESOPHAGOGASTRODUODENOSCOPY (EGD) WITH PROPOFOL;  Surgeon: Irene Shipper, MD;  Location: [facility] ENDOSCOPY;  Service: Endoscopy;  Laterality: N/A;  . HERNIA REPAIR    . HYSTEROSCOPY W/D&C  01/2005   for uterine fibroids.   . INSERTION OF MESH N/A 08/24/2013   Procedure: INSERTION OF MESH;  Surgeon: Edward Jolly, MD;  Location: [place];  Service: General;  Laterality: N/A;  . JOINT REPLACEMENT Right   . LIPOMA EXCISION Left 06/21/2015   Procedure: EXCISION OF LEFT SCALP LIPOMA;  Surgeon: Johnathan Hausen, MD;  Location: [place];  Service: General;  Laterality: Left;  . PANNICULECTOMY N/A 08/24/2013   Procedure:  PANNICULECTOMY;  Surgeon: Edward Jolly, MD;  Location: [place];  Service: General;  Laterality: N/A;  . TOTAL ABDOMINAL HYSTERECTOMY W/ BILATERAL SALPINGOOPHORECTOMY  05/2005  . TOTAL HIP ARTHROPLASTY Right 12/12/2013   Procedure: RIGHT TOTAL HIP ARTHROPLASTY ANTERIOR APPROACH;  Surgeon: Mcarthur Rossetti, MD;  Location: [place];  Service: Orthopedics;  Laterality: Right;  . VENTRAL HERNIA REPAIR N/A 08/24/2013   Procedure: HERNIA REPAIR VENTRAL ADULT;  Surgeon: Edward Jolly, MD;  Location: [place];  Service: General;  Laterality: N/A;    SOCIAL HISTORY: Social History   Tobacco Use  . Smoking status: Former Smoker    Packs/day: 0.25    Years: 15.00    Pack years: 3.75    Types: Cigarettes    Last attempt to  quit: 06/22/1978    Years since quitting: 39.3  . Smokeless tobacco: Never Used  Substance Use Topics  . Alcohol use: No    Alcohol/week: 0.0 oz  . Drug use: No    FAMILY HISTORY: Family History  Problem Relation Age of Onset  . Emphysema Mother        smoked  . Diabetes Mother   . Hypertension Mother   . Colon cancer Father        7-s  . Hypertension Father   . Heart disease Father   . Breast cancer Sister     ROS: Review of Systems  Constitutional: Negative for weight loss.  Gastrointestinal: Negative for nausea and vomiting.  Musculoskeletal:       Negative for muscle weakness  Psychiatric/Behavioral: Positive for depression. Negative for suicidal ideas.    PHYSICAL EXAM: Blood pressure 126/83, pulse (!) 59, temperature (!) 97.5 F (36.4 C), height '5\' 2"'$  (1.575 m), weight 196 lb (88.9 kg), SpO2 99 %. Body mass index is 35.85 kg/m. Physical Exam  Constitutional: She is oriented to person, place, and time. She appears well-developed and well-nourished.  Cardiovascular: Normal rate.  Pulmonary/Chest: Effort normal.  Musculoskeletal: Normal range of motion.  Neurological: She is oriented to person, place, and time.  Skin: Skin is warm and dry.    Psychiatric: She has a normal mood and affect. Her behavior is normal.  Vitals reviewed.   RECENT LABS AND TESTS: BMET    Component Value Date/Time   NA 141 08/02/2017 1035   K 4.8 08/02/2017 1035   CL 102 08/02/2017 1035   CO2 24 08/02/2017 1035   GLUCOSE 120 (H) 08/02/2017 1035   GLUCOSE 91 08/16/2015 0353   BUN 14 08/02/2017 1035   CREATININE 0.94 08/02/2017 1035   CALCIUM 9.9 08/02/2017 1035   GFRNONAA 66 08/02/2017 1035   GFRAA 76 08/02/2017 1035   Lab Results  Component Value Date   HGBA1C 6.8 (H) 08/02/2017   Lab Results  Component Value Date   INSULIN 17.4 08/02/2017   CBC    Component Value Date/Time   WBC 5.7 08/02/2017 1035   WBC 5.5 08/21/2015 0332   RBC 4.76 08/02/2017 1035   RBC 4.12 08/21/2015 0332   HGB 13.4 08/02/2017 1035   HCT 40.7 08/02/2017 1035   PLT 376 08/21/2015 0332   MCV 86 08/02/2017 1035   MCH 28.2 08/02/2017 1035   MCH 28.9 08/21/2015 0332   MCHC 32.9 08/02/2017 1035   MCHC 33.5 08/21/2015 0332   RDW 14.9 08/02/2017 1035   LYMPHSABS 2.2 08/02/2017 1035   MONOABS 0.6 08/21/2015 0332   EOSABS 0.2 08/02/2017 1035   BASOSABS 0.0 08/02/2017 1035   Iron/TIBC/Ferritin/ %Sat No results found for: IRON, TIBC, FERRITIN, IRONPCTSAT Lipid Panel     Component Value Date/Time   CHOL 135 08/02/2017 1035   TRIG 65 08/02/2017 1035   HDL 54 08/02/2017 1035   CHOLHDL 2.8 08/14/2015 0417   VLDL 13 08/14/2015 0417   LDLCALC 68 08/02/2017 1035   Hepatic Function Panel     Component Value Date/Time   PROT 6.9 08/02/2017 1035   ALBUMIN 4.4 08/02/2017 1035   AST 15 08/02/2017 1035   ALT 18 08/02/2017 1035   ALKPHOS 101 08/02/2017 1035   BILITOT 0.3 08/02/2017 1035      Component Value Date/Time   TSH 2.000 08/02/2017 1035   Results for MIKEILA, BURGEN (MRN [id number]) as of 10/18/2017 13:34  Ref. Range 08/02/2017 10:35  Vitamin  D, 25-Hydroxy Latest Ref Range: 30.0 - 100.0 ng/mL 17.9 (L)   ASSESSMENT AND PLAN: Vitamin D deficiency -  Plan: Vitamin D, Ergocalciferol, (DRISDOL) 50000 units CAPS capsule  Other depression - with emotional eating   At risk for osteoporosis  Class 2 severe obesity with serious comorbidity and body mass index (BMI) of 35.0 to 35.9 in adult, unspecified obesity type ([place])  PLAN:  Vitamin D Deficiency [name] was informed that low vitamin D levels contributes to fatigue and are associated with obesity, breast, and colon cancer. She agrees to continue to take prescription Vit D _0 ,000 IU every week and will follow up for routine testing of vitamin D, at least 2-3 times per year. She was informed of the risk of over-replacement of vitamin D and agrees to not increase her dose unless she discusses this with [name] first.  At risk for osteopenia and osteoporosis [name] is at risk for osteopenia and osteoporosis due to her vitamin D deficiency. She was encouraged to take her vitamin D and follow her higher calcium diet and increase strengthening exercise to help strengthen her bones and decrease her risk of osteopenia and osteoporosis.  Depression with Emotional Eating Behaviors We discussed behavior modification techniques today to help [name] deal with her emotional eating and depression. She was offered an antidepressant, but she declined. She will follow up with our clinic in 2 to 3 weeks to reassess.   Obesity [name] is currently in the action stage of change. As such, her goal is to continue with weight loss efforts She has agreed to follow the Category 2 plan [name] has been instructed to work up to a goal of 150 minutes of combined cardio and strengthening exercise per week for weight loss and overall health benefits. We discussed the following Behavioral Modification Strategies today: no skipping meals, increasing lean protein intake, decreasing simple carbohydrates , travel eating strategies  and emotional eating strategies  [name] has agreed to follow up with our clinic in 2 to 3 weeks. She was  informed of the importance of frequent follow up visits to maximize her success with intensive lifestyle modifications for her multiple health conditions.   OBESITY BEHAVIORAL INTERVENTION VISIT  Today's visit was # 4 out of 22.  Starting weight: 204 lbs Starting date: 08/02/17 Today's weight : 196 lbs Today's date: 10/14/2017 Total lbs lost to date: 8 (Patients must lose 7 lbs in the first 6 months to continue with counseling)   ASK: We discussed the diagnosis of obesity with [name] today and [name] agreed to give [name] permission to discuss obesity behavioral modification therapy today.  ASSESS: [name] has the diagnosis of obesity and her BMI today is 35.84 [name] is in the action stage of change   ADVISE: [name] was educated on the multiple health risks of obesity as well as the benefit of weight loss to improve her health. She was advised of the need for long term treatment and the importance of lifestyle modifications.  AGREE: Multiple dietary modification options and treatment options were discussed and  [name] agreed to the above obesity treatment plan.  I, Doreene Nest, am acting as transcriptionist for Dennard Nip, MD  I have reviewed the above documentation for accuracy and completeness, and I agree with the above. -Dennard Nip, MD

## 2017-10-20 ENCOUNTER — Encounter: Payer: Self-pay | Admitting: Cardiology

## 2017-11-08 NOTE — Progress Notes (Deleted)
Cardiology Office Note   Date:  11/08/2017   ID:  Aliya, Sol [birth year], MRN [id number]  PCP:  Antony Contras, MD  Cardiologist:   Marletta Bousquet [place], MD   No chief complaint on file.     History of Present Illness: [name] is a 62 y.o. female who is seen at the request of Harlan Stains MD for evaluation of chest pain. She has a history of HTN and DM.     Past Medical History:  Diagnosis Date  . Anemia 2006   required transfusion post TAH/BSO 05/2005  . Arthritis   . Asthma   . Chronic cough   . Colon polyps    hyperplastic  . Diabetes mellitus without complication ([place])   . Diverticulosis   . GERD (gastroesophageal reflux disease)    on prilosec  . GLA deficiency ([place])   . Glaucoma of both eyes   . Hypertension   . Joint pain   . Osteoarthritis   . Pancreatitis   . Shortness of breath dyspnea     Past Surgical History:  Procedure Laterality Date  . ABDOMINAL HYSTERECTOMY    . BREAST EXCISIONAL BIOPSY Right 1990  . COLONOSCOPY N/A 08/22/2013   Procedure: COLONOSCOPY;  Surgeon: Irene Shipper, MD;  Location: [facility] ENDOSCOPY;  Service: Endoscopy;  Laterality: N/A;  . ESOPHAGOGASTRODUODENOSCOPY N/A 08/22/2013   Procedure: ESOPHAGOGASTRODUODENOSCOPY (EGD);  Surgeon: Irene Shipper, MD;  Location: [facility] ENDOSCOPY;  Service: Endoscopy;  Laterality: N/A;  . ESOPHAGOGASTRODUODENOSCOPY (EGD) WITH PROPOFOL N/A 08/19/2015   Procedure: ESOPHAGOGASTRODUODENOSCOPY (EGD) WITH PROPOFOL;  Surgeon: Irene Shipper, MD;  Location: [facility] ENDOSCOPY;  Service: Endoscopy;  Laterality: N/A;  . HERNIA REPAIR    . HYSTEROSCOPY W/D&C  01/2005   for uterine fibroids.   . INSERTION OF MESH N/A 08/24/2013   Procedure: INSERTION OF MESH;  Surgeon: Edward Jolly, MD;  Location: [place];  Service: General;  Laterality: N/A;  . JOINT REPLACEMENT Right   . LIPOMA EXCISION Left 06/21/2015   Procedure: EXCISION OF LEFT SCALP LIPOMA;  Surgeon: Johnathan Hausen, MD;  Location: [place];   Service: General;  Laterality: Left;  . PANNICULECTOMY N/A 08/24/2013   Procedure: PANNICULECTOMY;  Surgeon: Edward Jolly, MD;  Location: [place];  Service: General;  Laterality: N/A;  . TOTAL ABDOMINAL HYSTERECTOMY W/ BILATERAL SALPINGOOPHORECTOMY  05/2005  . TOTAL HIP ARTHROPLASTY Right 12/12/2013   Procedure: RIGHT TOTAL HIP ARTHROPLASTY ANTERIOR APPROACH;  Surgeon: Mcarthur Rossetti, MD;  Location: [place];  Service: Orthopedics;  Laterality: Right;  . VENTRAL HERNIA REPAIR N/A 08/24/2013   Procedure: HERNIA REPAIR VENTRAL ADULT;  Surgeon: Edward Jolly, MD;  Location: [facility] OR;  Service: General;  Laterality: N/A;     Current Outpatient Medications  Medication Sig Dispense Refill  . albuterol (PROVENTIL) (2.5 MG/3ML) 0.083% nebulizer solution Take 2.5 mg by nebulization every 6 (six) hours as needed.     Marland Kitchen amLODipine (NORVASC) 5 MG tablet Take 1 tablet (5 mg total) by mouth daily. 30 tablet 1  . aspirin 81 MG tablet Take 81 mg by mouth daily.    . blood glucose meter kit and supplies KIT Dispense based on patient and insurance preference. Use up to four times daily as directed. (FOR ICD-9 250.00, 250.01). 1 each 0  . budesonide-formoterol (SYMBICORT) 80-4.5 MCG/ACT inhaler Take 2 puffs first thing in am and then another 2 puffs about 12 hours later. 1 Inhaler 11  . empagliflozin (JARDIANCE) 25 MG  TABS tablet Take 25 mg by mouth daily.    Marland Kitchen glimepiride (AMARYL) 4 MG tablet Take 4 mg by mouth daily with breakfast.    . glucose blood test strip Use as instructed 100 each 1  . losartan (COZAAR) 100 MG tablet Take 1 tablet (100 mg total) by mouth daily. 30 tablet 1  . MELATONIN PO Take 1 tablet by mouth at bedtime as needed (sleep).     . metFORMIN (GLUCOPHAGE) 1000 MG tablet Take 1,000 mg by mouth 2 (two) times daily with a meal.    . omeprazole (PRILOSEC) 40 MG capsule Take 40 mg by mouth daily.     . ONE TOUCH LANCETS MISC 1 Package by Does not apply route every morning. 100 each 0    . travoprost, benzalkonium, (TRAVATAN) 0.004 % ophthalmic solution Place 1 drop into both eyes at bedtime.    . Vitamin D, Ergocalciferol, (DRISDOL) 50000 units CAPS capsule Take 1 capsule (50,000 Units total) by mouth every 7 (seven) days. 4 capsule 0   No current facility-administered medications for this visit.     Allergies:   Diclofenac and Penicillins    Social History:  The patient  reports that she quit smoking about 39 years ago. Her smoking use included cigarettes. She has a 3.75 pack-year smoking history. She has never used smokeless tobacco. She reports that she does not drink alcohol or use drugs.   Family History:  The patient's ***family history includes Breast cancer in her sister; Colon cancer in her father; Diabetes in her mother; Emphysema in her mother; Heart disease in her father; Hypertension in her father and mother.    ROS:  Please see the history of present illness.   Otherwise, review of systems are positive for {NONE DEFAULTED:18576::"none"}.   All other systems are reviewed and negative.    PHYSICAL EXAM: VS:  There were no vitals taken for this visit. , BMI There is no height or weight on file to calculate BMI. GEN: Well nourished, well developed, in no acute distress  HEENT: normal  Neck: no JVD, carotid bruits, or masses Cardiac: ***RRR; no murmurs, rubs, or gallops,no edema  Respiratory:  clear to auscultation bilaterally, normal work of breathing GI: soft, nontender, nondistended, + BS MS: no deformity or atrophy  Skin: warm and dry, no rash Neuro:  Strength and sensation are intact Psych: euthymic mood, full affect   EKG:  EKG {ACTION; IS/IS DYJ:[id number]} ordered today. The ekg ordered today demonstrates ***   Recent Labs: 08/02/2017: ALT 18; BUN 14; Creatinine, Ser 0.94; Hemoglobin 13.4; Potassium 4.8; Sodium 141; TSH 2.000    Lipid Panel    Component Value Date/Time   CHOL 135 08/02/2017 1035   TRIG 65 08/02/2017 1035   HDL 54  08/02/2017 1035   CHOLHDL 2.8 08/14/2015 0417   VLDL 13 08/14/2015 0417   LDLCALC 68 08/02/2017 1035      Wt Readings from Last 3 Encounters:  10/14/17 196 lb (88.9 kg)  09/15/17 (P) 196 lb (88.9 kg)  08/25/17 199 lb (90.3 kg)      Other studies Reviewed: Additional studies/ records that were reviewed today include: ***. Review of the above records demonstrates: ***   ASSESSMENT AND PLAN:  1.  ***   Current medicines are reviewed at length with the patient today.  The patient {ACTIONS; HAS/DOES NOT HAVE:19233} concerns regarding medicines.  The following changes have been made:  {PLAN; NO CHANGE:13088:s}  Labs/ tests ordered today include: *** No orders of  the defined types were placed in this encounter.    Disposition:   FU with *** in {gen number [id number]} {Days to years:10300}  Signed, Tysen Roesler [place], MD  11/08/2017 4:31 PM    [facility] [address] [place], [place], [address] Phone [phone], Fax [phone]

## 2017-11-10 ENCOUNTER — Ambulatory Visit: Payer: BC Managed Care – PPO | Admitting: Cardiology

## 2017-11-11 ENCOUNTER — Ambulatory Visit (INDEPENDENT_AMBULATORY_CARE_PROVIDER_SITE_OTHER): Payer: BC Managed Care – PPO | Admitting: Physician Assistant

## 2017-11-11 VITALS — BP 109/74 | HR 70 | Temp 97.5°F | Ht 62.0 in | Wt 192.0 lb

## 2017-11-11 DIAGNOSIS — Z9189 Other specified personal risk factors, not elsewhere classified: Secondary | ICD-10-CM

## 2017-11-11 DIAGNOSIS — Z6835 Body mass index (BMI) 35.0-35.9, adult: Secondary | ICD-10-CM

## 2017-11-11 DIAGNOSIS — E559 Vitamin D deficiency, unspecified: Secondary | ICD-10-CM

## 2017-11-11 DIAGNOSIS — E119 Type 2 diabetes mellitus without complications: Secondary | ICD-10-CM

## 2017-11-11 MED ORDER — VITAMIN D (ERGOCALCIFEROL) 1.25 MG (50000 UNIT) PO CAPS: 50000.0000 [IU] | ORAL_CAPSULE | ORAL | 0 refills | Status: DC

## 2017-11-11 NOTE — Progress Notes (Signed)
Office: [phone]  /  Fax: [phone]   HPI:   Chief Complaint: OBESITY [name] is here to discuss her progress with her obesity treatment plan. She is on the Category 2 plan and is following her eating plan approximately 60 % of the time. She states she is exercising 0 minutes 0 times per week. [name] continues to do well with weight loss. She has been sick with an upper respiratory infection and has not eaten all the recommended protein on the meal plan. Her weight is 192 lb (87.1 kg) today and has had a weight loss of 4 pounds over a period of 4 weeks since her last visit. She has lost 12 lbs since starting treatment with [name].  Vitamin D deficiency [name] has a diagnosis of vitamin D deficiency. She is currently taking vit D and denies nausea, vomiting or muscle weakness.  At risk for osteopenia and osteoporosis [name] is at higher risk of osteopenia and osteoporosis due to vitamin D deficiency.   Diabetes II without complications, non insulin [name] has a diagnosis of diabetes type II. [name] states fasting BGs range between 110 and 130's, post prandial BGs range between 100 and 120's and she denies any hypoglycemic episodes. She is currently on glimepiride, jardiance and metformin.  Last A1c was at 6.8 She has been working on intensive lifestyle modifications including diet, exercise, and weight loss to help control her blood glucose levels.  ALLERGIES: Allergies  Allergen Reactions  . Diclofenac     Hives   . Penicillins Hives    Has patient had a PCN reaction causing immediate rash, facial/tongue/throat swelling, SOB or lightheadedness with hypotension: Yes Has patient had a PCN reaction causing severe rash involving mucus membranes or skin necrosis: Yes Has patient had a PCN reaction that required hospitalization No Has patient had a PCN reaction occurring within the last 10 years: No. If all of the above answers are "NO", then may proceed with Cephalosporin use.      MEDICATIONS: Current Outpatient Medications on File Prior to Visit  Medication Sig Dispense Refill  . albuterol (PROVENTIL) (2.5 MG/3ML) 0.083% nebulizer solution Take 2.5 mg by nebulization every 6 (six) hours as needed.     Marland Kitchen amLODipine (NORVASC) 5 MG tablet Take 1 tablet (5 mg total) by mouth daily. 30 tablet 1  . aspirin 81 MG tablet Take 81 mg by mouth daily.    . blood glucose meter kit and supplies KIT Dispense based on patient and insurance preference. Use up to four times daily as directed. (FOR ICD-9 250.00, 250.01). 1 each 0  . budesonide-formoterol (SYMBICORT) 80-4.5 MCG/ACT inhaler Take 2 puffs first thing in am and then another 2 puffs about 12 hours later. 1 Inhaler 11  . empagliflozin (JARDIANCE) 25 MG TABS tablet Take 25 mg by mouth daily.    Marland Kitchen glimepiride (AMARYL) 4 MG tablet Take 4 mg by mouth daily with breakfast.    . glucose blood test strip Use as instructed 100 each 1  . losartan (COZAAR) 100 MG tablet Take 1 tablet (100 mg total) by mouth daily. 30 tablet 1  . MELATONIN PO Take 1 tablet by mouth at bedtime as needed (sleep).     . metFORMIN (GLUCOPHAGE) 1000 MG tablet Take 1,000 mg by mouth 2 (two) times daily with a meal.    . omeprazole (PRILOSEC) 40 MG capsule Take 40 mg by mouth daily.     . ONE TOUCH LANCETS MISC 1 Package by Does not apply route every  morning. 100 each 0  . travoprost, benzalkonium, (TRAVATAN) 0.004 % ophthalmic solution Place 1 drop into both eyes at bedtime.    . Vitamin D, Ergocalciferol, (DRISDOL) 50000 units CAPS capsule Take 1 capsule (50,000 Units total) by mouth every 7 (seven) days. 4 capsule 0   No current facility-administered medications on file prior to visit.     PAST MEDICAL HISTORY: Past Medical History:  Diagnosis Date  . Anemia 2006   required transfusion post TAH/BSO 05/2005  . Arthritis   . Asthma   . Chronic cough   . Colon polyps    hyperplastic  . Diabetes mellitus without complication ([place])   .  Diverticulosis   . GERD (gastroesophageal reflux disease)    on prilosec  . GLA deficiency ([place])   . Glaucoma of both eyes   . Hypertension   . Joint pain   . Osteoarthritis   . Pancreatitis   . Shortness of breath dyspnea     PAST SURGICAL HISTORY: Past Surgical History:  Procedure Laterality Date  . ABDOMINAL HYSTERECTOMY    . BREAST EXCISIONAL BIOPSY Right 1990  . COLONOSCOPY N/A 08/22/2013   Procedure: COLONOSCOPY;  Surgeon: Irene Shipper, MD;  Location: [facility] ENDOSCOPY;  Service: Endoscopy;  Laterality: N/A;  . ESOPHAGOGASTRODUODENOSCOPY N/A 08/22/2013   Procedure: ESOPHAGOGASTRODUODENOSCOPY (EGD);  Surgeon: Irene Shipper, MD;  Location: [facility] ENDOSCOPY;  Service: Endoscopy;  Laterality: N/A;  . ESOPHAGOGASTRODUODENOSCOPY (EGD) WITH PROPOFOL N/A 08/19/2015   Procedure: ESOPHAGOGASTRODUODENOSCOPY (EGD) WITH PROPOFOL;  Surgeon: Irene Shipper, MD;  Location: [facility] ENDOSCOPY;  Service: Endoscopy;  Laterality: N/A;  . HERNIA REPAIR    . HYSTEROSCOPY W/D&C  01/2005   for uterine fibroids.   . INSERTION OF MESH N/A 08/24/2013   Procedure: INSERTION OF MESH;  Surgeon: Edward Jolly, MD;  Location: [place];  Service: General;  Laterality: N/A;  . JOINT REPLACEMENT Right   . LIPOMA EXCISION Left 06/21/2015   Procedure: EXCISION OF LEFT SCALP LIPOMA;  Surgeon: Johnathan Hausen, MD;  Location: [place];  Service: General;  Laterality: Left;  . PANNICULECTOMY N/A 08/24/2013   Procedure: PANNICULECTOMY;  Surgeon: Edward Jolly, MD;  Location: [place];  Service: General;  Laterality: N/A;  . TOTAL ABDOMINAL HYSTERECTOMY W/ BILATERAL SALPINGOOPHORECTOMY  05/2005  . TOTAL HIP ARTHROPLASTY Right 12/12/2013   Procedure: RIGHT TOTAL HIP ARTHROPLASTY ANTERIOR APPROACH;  Surgeon: Mcarthur Rossetti, MD;  Location: [place];  Service: Orthopedics;  Laterality: Right;  . VENTRAL HERNIA REPAIR N/A 08/24/2013   Procedure: HERNIA REPAIR VENTRAL ADULT;  Surgeon: Edward Jolly, MD;  Location: [place];  Service: General;  Laterality: N/A;    SOCIAL HISTORY: Social History   Tobacco Use  . Smoking status: Former Smoker    Packs/day: 0.25    Years: 15.00    Pack years: 3.75    Types: Cigarettes    Last attempt to quit: 06/22/1978    Years since quitting: 39.4  . Smokeless tobacco: Never Used  Substance Use Topics  . Alcohol use: No    Alcohol/week: 0.0 oz  . Drug use: No    FAMILY HISTORY: Family History  Problem Relation Age of Onset  . Emphysema Mother        smoked  . Diabetes Mother   . Hypertension Mother   . Colon cancer Father        7-s  . Hypertension Father   . Heart disease Father   . Breast cancer Sister  ROS: Review of Systems  Constitutional: Positive for weight loss.  Gastrointestinal: Negative for nausea and vomiting.  Musculoskeletal:       Negative for muscle weakness   Endo/Heme/Allergies:       Negative for hypoglycemia    PHYSICAL EXAM: Blood pressure 109/74, pulse 70, temperature (!) 97.5 F (36.4 C), temperature source Oral, height '5\' 2"'  (1.575 m), weight 192 lb (87.1 kg), SpO2 98 %. Body mass index is 35.12 kg/m. Physical Exam  Constitutional: She is oriented to person, place, and time. She appears well-developed and well-nourished.  Cardiovascular: Normal rate.  Pulmonary/Chest: Effort normal.  Musculoskeletal: Normal range of motion.  Neurological: She is oriented to person, place, and time.  Skin: Skin is warm and dry.  Psychiatric: She has a normal mood and affect. Her behavior is normal.  Vitals reviewed.   RECENT LABS AND TESTS: BMET    Component Value Date/Time   NA 141 08/02/2017 1035   K 4.8 08/02/2017 1035   CL 102 08/02/2017 1035   CO2 24 08/02/2017 1035   GLUCOSE 120 (H) 08/02/2017 1035   GLUCOSE 91 08/16/2015 0353   BUN 14 08/02/2017 1035   CREATININE 0.94 08/02/2017 1035   CALCIUM 9.9 08/02/2017 1035   GFRNONAA 66 08/02/2017 1035   GFRAA 76 08/02/2017 1035   Lab Results  Component Value Date    HGBA1C 6.8 (H) 08/02/2017   Lab Results  Component Value Date   INSULIN 17.4 08/02/2017   CBC    Component Value Date/Time   WBC 5.7 08/02/2017 1035   WBC 5.5 08/21/2015 0332   RBC 4.76 08/02/2017 1035   RBC 4.12 08/21/2015 0332   HGB 13.4 08/02/2017 1035   HCT 40.7 08/02/2017 1035   PLT 376 08/21/2015 0332   MCV 86 08/02/2017 1035   MCH 28.2 08/02/2017 1035   MCH 28.9 08/21/2015 0332   MCHC 32.9 08/02/2017 1035   MCHC 33.5 08/21/2015 0332   RDW 14.9 08/02/2017 1035   LYMPHSABS 2.2 08/02/2017 1035   MONOABS 0.6 08/21/2015 0332   EOSABS 0.2 08/02/2017 1035   BASOSABS 0.0 08/02/2017 1035   Iron/TIBC/Ferritin/ %Sat No results found for: IRON, TIBC, FERRITIN, IRONPCTSAT Lipid Panel     Component Value Date/Time   CHOL 135 08/02/2017 1035   TRIG 65 08/02/2017 1035   HDL 54 08/02/2017 1035   CHOLHDL 2.8 08/14/2015 0417   VLDL 13 08/14/2015 0417   LDLCALC 68 08/02/2017 1035   Hepatic Function Panel     Component Value Date/Time   PROT 6.9 08/02/2017 1035   ALBUMIN 4.4 08/02/2017 1035   AST 15 08/02/2017 1035   ALT 18 08/02/2017 1035   ALKPHOS 101 08/02/2017 1035   BILITOT 0.3 08/02/2017 1035      Component Value Date/Time   TSH 2.000 08/02/2017 1035   Results for ROSELEE, TAYLOE (MRN [id number]) as of 11/11/2017 15:07  Ref. Range 08/02/2017 10:35  Vitamin D, 25-Hydroxy Latest Ref Range: 30.0 - 100.0 ng/mL 17.9 (L)   ASSESSMENT AND PLAN: Vitamin D deficiency - Plan: Vitamin D, Ergocalciferol, (DRISDOL) 50000 units CAPS capsule  Type 2 diabetes mellitus without complication, without long-term current use of insulin (HCC)  At risk for osteoporosis  Class 2 severe obesity with serious comorbidity and body mass index (BMI) of 35.0 to 35.9 in adult, unspecified obesity type ([place])  PLAN:  Vitamin D Deficiency [name] was informed that low vitamin D levels contributes to fatigue and are associated with obesity, breast, and colon cancer. She agrees  to continue to take  prescription Vit D '@50' ,000 IU every week #4 with no refills and will follow up for routine testing of vitamin D, at least 2-3 times per year. She was informed of the risk of over-replacement of vitamin D and agrees to not increase her dose unless she discusses this with [name] first. [place] agrees to follow up as directed.  At risk for osteopenia and osteoporosis [name] is at risk for osteopenia and osteoporosis due to her vitamin D deficiency. She was encouraged to take her vitamin D and follow her higher calcium diet and increase strengthening exercise to help strengthen her bones and decrease her risk of osteopenia and osteoporosis.  Diabetes II without complications, non insulin [name] has been given extensive diabetes education by myself today including ideal fasting and post-prandial blood glucose readings, individual ideal Hgb A1c goals  and hypoglycemia prevention. We discussed the importance of good blood sugar control to decrease the likelihood of diabetic complications such as nephropathy, neuropathy, limb loss, blindness, coronary artery disease, and death. We discussed the importance of intensive lifestyle modification including diet, exercise and weight loss as the first line treatment for diabetes. [name] agrees to continue her diabetes medications and will follow up at the agreed upon time.  Obesity [name] is currently in the action stage of change. As such, her goal is to continue with weight loss efforts She has agreed to follow the Category 2 plan [name] has been instructed to work up to a goal of 150 minutes of combined cardio and strengthening exercise per week for weight loss and overall health benefits. We discussed the following Behavioral Modification Strategies today: increasing lean protein intake and work on meal planning and easy cooking plans  [name] has agreed to follow up with our clinic in 3 weeks. She was informed of the importance of frequent follow up visits to maximize her  success with intensive lifestyle modifications for her multiple health conditions.   OBESITY BEHAVIORAL INTERVENTION VISIT  Today's visit was # 5 out of 22.  Starting weight: 204 lbs Starting date: 08/02/17 Today's weight : 192 lbs Today's date: 11/11/2017 Total lbs lost to date: 12 (Patients must lose 7 lbs in the first 6 months to continue with counseling)   ASK: We discussed the diagnosis of obesity with [name] today and [name] agreed to give [name] permission to discuss obesity behavioral modification therapy today.  ASSESS: [name] has the diagnosis of obesity and her BMI today is 35.11 [name] is in the action stage of change   ADVISE: [name] was educated on the multiple health risks of obesity as well as the benefit of weight loss to improve her health. She was advised of the need for long term treatment and the importance of lifestyle modifications.  AGREE: Multiple dietary modification options and treatment options were discussed and  [name] agreed to the above obesity treatment plan.   Corey Skains, am acting as transcriptionist for [employer], PA-C I, Lacy Duverney [facility], have reviewed this note and agree with its content

## 2017-12-02 ENCOUNTER — Encounter (INDEPENDENT_AMBULATORY_CARE_PROVIDER_SITE_OTHER): Payer: Self-pay | Admitting: Physician Assistant

## 2017-12-02 ENCOUNTER — Ambulatory Visit (INDEPENDENT_AMBULATORY_CARE_PROVIDER_SITE_OTHER): Payer: BC Managed Care – PPO | Admitting: Physician Assistant

## 2017-12-02 VITALS — BP 109/71 | HR 71 | Temp 98.5°F | Ht 62.0 in | Wt 192.0 lb

## 2017-12-02 DIAGNOSIS — E119 Type 2 diabetes mellitus without complications: Secondary | ICD-10-CM

## 2017-12-02 DIAGNOSIS — F3289 Other specified depressive episodes: Secondary | ICD-10-CM

## 2017-12-02 DIAGNOSIS — Z6835 Body mass index (BMI) 35.0-35.9, adult: Secondary | ICD-10-CM

## 2017-12-02 DIAGNOSIS — Z9189 Other specified personal risk factors, not elsewhere classified: Secondary | ICD-10-CM | POA: Diagnosis not present

## 2017-12-06 ENCOUNTER — Telehealth (INDEPENDENT_AMBULATORY_CARE_PROVIDER_SITE_OTHER): Payer: Self-pay | Admitting: Physician Assistant

## 2017-12-06 ENCOUNTER — Other Ambulatory Visit (INDEPENDENT_AMBULATORY_CARE_PROVIDER_SITE_OTHER): Payer: Self-pay | Admitting: Physician Assistant

## 2017-12-06 MED ORDER — BUPROPION HCL ER (SR) 150 MG PO TB12: 150.0000 mg | ORAL_TABLET | Freq: Every day | ORAL | 0 refills | Status: DC

## 2017-12-06 NOTE — Telephone Encounter (Signed)
A medication for cravings , pt doesn't know the name, call in to [place] on [address]

## 2017-12-06 NOTE — Progress Notes (Signed)
Office: [phone]  /  Fax: [phone]   HPI:   Chief Complaint: OBESITY [name] is here to discuss her progress with her obesity treatment plan. She is on the Category 2 plan and is following her eating plan approximately 30 % of the time. She states she is exercising 20 minutes 5 times per week. [name] has been busy taking care of her sick husband and has not been as mindful of her eating.  Her weight is 192 lb (87.1 kg) today and has not lost weight since her last visit. She has lost 12 lbs since starting treatment with [name].  Diabetes II [name] has a diagnosis of diabetes type II. [name] did not bring in BGs log in for review, states fasting BGs range between 100 and 140's. She denies hypoglycemia. [name] is on Jardiance and Amaryl. She was educated about risk of hypoglycemia associated with Amaryl and situations when hypoglycemia likely to occur such as missing a meal. She is scheduled for repeat Hgb A1c in 2 months. She has been working on intensive lifestyle modifications including diet, exercise, and weight loss to help control her blood glucose levels.  At risk for cardiovascular disease [name] is at a higher than average risk for cardiovascular disease due to obesity and diabetes II. She currently denies any chest pain.  Depression with emotional eating behaviors [name] is struggling with emotional eating and using food for comfort to the extent that it is negatively impacting her health. She often snacks when she is not hungry. [name] sometimes feels she is out of control and then feels guilty that she made poor food choices. She has been working on behavior modification techniques to help reduce her emotional eating and has been somewhat successful. Her mood is stable and she shows no sign of suicidal or homicidal ideations.  Depression screen PHQ 2/9 08/02/2017  Decreased Interest 0  Down, Depressed, Hopeless 1  PHQ - 2 Score 1  Altered sleeping 2  Tired, decreased energy 2  Change  in appetite 2  Feeling bad or failure about yourself  0  Trouble concentrating 1  Moving slowly or fidgety/restless 0  Suicidal thoughts 0  PHQ-9 Score 8  Difficult doing work/chores Not difficult at all    ALLERGIES: Allergies  Allergen Reactions  . Diclofenac     Hives   . Penicillins Hives    Has patient had a PCN reaction causing immediate rash, facial/tongue/throat swelling, SOB or lightheadedness with hypotension: Yes Has patient had a PCN reaction causing severe rash involving mucus membranes or skin necrosis: Yes Has patient had a PCN reaction that required hospitalization No Has patient had a PCN reaction occurring within the last 10 years: No. If all of the above answers are "NO", then may proceed with Cephalosporin use.     MEDICATIONS: Current Outpatient Medications on File Prior to Visit  Medication Sig Dispense Refill  . albuterol (PROVENTIL) (2.5 MG/3ML) 0.083% nebulizer solution Take 2.5 mg by nebulization every 6 (six) hours as needed.     Marland Kitchen amLODipine (NORVASC) 5 MG tablet Take 1 tablet (5 mg total) by mouth daily. 30 tablet 1  . aspirin 81 MG tablet Take 81 mg by mouth daily.    . blood glucose meter kit and supplies KIT Dispense based on patient and insurance preference. Use up to four times daily as directed. (FOR ICD-9 250.00, 250.01). 1 each 0  . budesonide-formoterol (SYMBICORT) 80-4.5 MCG/ACT inhaler Take 2 puffs first thing in am and then another 2 puffs  about 12 hours later. 1 Inhaler 11  . empagliflozin (JARDIANCE) 25 MG TABS tablet Take 25 mg by mouth daily.    Marland Kitchen glimepiride (AMARYL) 4 MG tablet Take 4 mg by mouth daily with breakfast.    . glucose blood test strip Use as instructed 100 each 1  . losartan (COZAAR) 100 MG tablet Take 1 tablet (100 mg total) by mouth daily. 30 tablet 1  . MELATONIN PO Take 1 tablet by mouth at bedtime as needed (sleep).     . metFORMIN (GLUCOPHAGE) 1000 MG tablet Take 1,000 mg by mouth 2 (two) times daily with a meal.      . omeprazole (PRILOSEC) 40 MG capsule Take 40 mg by mouth daily.     . ONE TOUCH LANCETS MISC 1 Package by Does not apply route every morning. 100 each 0  . travoprost, benzalkonium, (TRAVATAN) 0.004 % ophthalmic solution Place 1 drop into both eyes at bedtime.    . Vitamin D, Ergocalciferol, (DRISDOL) 50000 units CAPS capsule Take 1 capsule (50,000 Units total) by mouth every 7 (seven) days. 4 capsule 0   No current facility-administered medications on file prior to visit.     PAST MEDICAL HISTORY: Past Medical History:  Diagnosis Date  . Anemia 2006   required transfusion post TAH/BSO 05/2005  . Arthritis   . Asthma   . Chronic cough   . Colon polyps    hyperplastic  . Diabetes mellitus without complication ([place])   . Diverticulosis   . GERD (gastroesophageal reflux disease)    on prilosec  . GLA deficiency ([place])   . Glaucoma of both eyes   . Hypertension   . Joint pain   . Osteoarthritis   . Pancreatitis   . Shortness of breath dyspnea     PAST SURGICAL HISTORY: Past Surgical History:  Procedure Laterality Date  . ABDOMINAL HYSTERECTOMY    . BREAST EXCISIONAL BIOPSY Right 1990  . COLONOSCOPY N/A 08/22/2013   Procedure: COLONOSCOPY;  Surgeon: Irene Shipper, MD;  Location[facility] ENDOSCOPY;  Service: Endoscopy;  Laterality: N/A;  . ESOPHAGOGASTRODUODENOSCOPY N/A 08/22/2013   Procedure: ESOPHAGOGASTRODUODENOSCOPY (EGD);  Surgeon: Irene Shipper, MD;  Location: [facility] ENDOSCOPY;  Service: Endoscopy;  Laterality: N/A;  . ESOPHAGOGASTRODUODENOSCOPY (EGD) WITH PROPOFOL N/A 08/19/2015   Procedure: ESOPHAGOGASTRODUODENOSCOPY (EGD) WITH PROPOFOL;  Surgeon: Irene Shipper, MD;  Location: [facility] ENDOSCOPY;  Service: Endoscopy;  Laterality: N/A;  . HERNIA REPAIR    . HYSTEROSCOPY W/D&C  01/2005   for uterine fibroids.   . INSERTION OF MESH N/A 08/24/2013   Procedure: INSERTION OF MESH;  Surgeon: Edward Jolly, MD;  Location: [place];  Service: General;  Laterality: N/A;  . JOINT REPLACEMENT Right    . LIPOMA EXCISION Left 06/21/2015   Procedure: EXCISION OF LEFT SCALP LIPOMA;  Surgeon: Johnathan Hausen, MD;  Location: [place];  Service: General;  Laterality: Left;  . PANNICULECTOMY N/A 08/24/2013   Procedure: PANNICULECTOMY;  Surgeon: Edward Jolly, MD;  Location: [place];  Service: General;  Laterality: N/A;  . TOTAL ABDOMINAL HYSTERECTOMY W/ BILATERAL SALPINGOOPHORECTOMY  05/2005  . TOTAL HIP ARTHROPLASTY Right 12/12/2013   Procedure: RIGHT TOTAL HIP ARTHROPLASTY ANTERIOR APPROACH;  Surgeon: Mcarthur Rossetti, MD;  Location: [place];  Service: Orthopedics;  Laterality: Right;  . VENTRAL HERNIA REPAIR N/A 08/24/2013   Procedure: HERNIA REPAIR VENTRAL ADULT;  Surgeon: Edward Jolly, MD;  Location: [place];  Service: General;  Laterality: N/A;    SOCIAL HISTORY:  Social History   Tobacco Use  . Smoking status: Former Smoker    Packs/day: 0.25    Years: 15.00    Pack years: 3.75    Types: Cigarettes    Last attempt to quit: 06/22/1978    Years since quitting: 39.4  . Smokeless tobacco: Never Used  Substance Use Topics  . Alcohol use: No    Alcohol/week: 0.0 oz  . Drug use: No    FAMILY HISTORY: Family History  Problem Relation Age of Onset  . Emphysema Mother        smoked  . Diabetes Mother   . Hypertension Mother   . Colon cancer Father        7-s  . Hypertension Father   . Heart disease Father   . Breast cancer Sister     ROS: Review of Systems  Constitutional: Negative for weight loss.  Cardiovascular: Negative for chest pain.  Endo/Heme/Allergies:       Negative hypoglycemia  Psychiatric/Behavioral: Positive for depression. Negative for suicidal ideas.    PHYSICAL EXAM: Blood pressure 109/71, pulse 71, temperature 98.5 F (36.9 C), temperature source Oral, height _0  (1.575 m), weight 192 lb (87.1 kg), SpO2 99 %. Body mass index is 35.12 kg/m. Physical Exam  Constitutional: She is oriented to person, place, and time. She appears  well-developed and well-nourished.  Cardiovascular: Normal rate.  Pulmonary/Chest: Effort normal.  Musculoskeletal: Normal range of motion.  Neurological: She is oriented to person, place, and time.  Skin: Skin is warm and dry.  Psychiatric: She has a normal mood and affect. Her behavior is normal.  Vitals reviewed.   RECENT LABS AND TESTS: BMET    Component Value Date/Time   NA 141 08/02/2017 1035   K 4.8 08/02/2017 1035   CL 102 08/02/2017 1035   CO2 24 08/02/2017 1035   GLUCOSE 120 (H) 08/02/2017 1035   GLUCOSE 91 08/16/2015 0353   BUN 14 08/02/2017 1035   CREATININE 0.94 08/02/2017 1035   CALCIUM 9.9 08/02/2017 1035   GFRNONAA 66 08/02/2017 1035   GFRAA 76 08/02/2017 1035   Lab Results  Component Value Date   HGBA1C 6.8 (H) 08/02/2017   Lab Results  Component Value Date   INSULIN 17.4 08/02/2017   CBC    Component Value Date/Time   WBC 5.7 08/02/2017 1035   WBC 5.5 08/21/2015 0332   RBC 4.76 08/02/2017 1035   RBC 4.12 08/21/2015 0332   HGB 13.4 08/02/2017 1035   HCT 40.7 08/02/2017 1035   PLT 376 08/21/2015 0332   MCV 86 08/02/2017 1035   MCH 28.2 08/02/2017 1035   MCH 28.9 08/21/2015 0332   MCHC 32.9 08/02/2017 1035   MCHC 33.5 08/21/2015 0332   RDW 14.9 08/02/2017 1035   LYMPHSABS 2.2 08/02/2017 1035   MONOABS 0.6 08/21/2015 0332   EOSABS 0.2 08/02/2017 1035   BASOSABS 0.0 08/02/2017 1035   Iron/TIBC/Ferritin/ %Sat No results found for: IRON, TIBC, FERRITIN, IRONPCTSAT Lipid Panel     Component Value Date/Time   CHOL 135 08/02/2017 1035   TRIG 65 08/02/2017 1035   HDL 54 08/02/2017 1035   CHOLHDL 2.8 08/14/2015 0417   VLDL 13 08/14/2015 0417   LDLCALC 68 08/02/2017 1035   Hepatic Function Panel     Component Value Date/Time   PROT 6.9 08/02/2017 1035   ALBUMIN 4.4 08/02/2017 1035   AST 15 08/02/2017 1035   ALT 18 08/02/2017 1035   ALKPHOS 101 08/02/2017 1035   BILITOT 0.3  08/02/2017 1035      Component Value Date/Time   TSH 2.000  08/02/2017 1035    ASSESSMENT AND PLAN: Type 2 diabetes mellitus without complication, without long-term current use of insulin (HCC)  Class 2 severe obesity with serious comorbidity and body mass index (BMI) of 35.0 to 35.9 in adult, unspecified obesity type ([place])  PLAN:  Diabetes II Charolette has been given extensive diabetes education by myself today including ideal fasting and post-prandial blood glucose readings, individual ideal Hgb A1c goals and hypoglycemia prevention. We discussed the importance of good blood sugar control to decrease the likelihood of diabetic complications such as nephropathy, neuropathy, limb loss, blindness, coronary artery disease, and death. We discussed the importance of intensive lifestyle modification including diet, exercise and weight loss as the first line treatment for diabetes. Circe agrees to continue her diabetes medications and she agrees to follow up with our clinic in 2 weeks.  Cardiovascular risk counselling Atianna was given extended (15 minutes) coronary artery disease prevention counseling today. She is 62 y.o. female and has risk factors for heart disease including obesity and diabetes II. We discussed intensive lifestyle modifications today with an emphasis on specific weight loss instructions and strategies. Pt was also informed of the importance of increasing exercise and decreasing saturated fats to help prevent heart disease.  Depression with Emotional Eating Behaviors We discussed behavior modification techniques today to help Khamari deal with her emotional eating and depression. Maryan agrees to start Wellbutrin SR 150 mg qd #30 with no refills. Geraldina agrees to follow up with our clinic in 2 weeks.  Obesity Blakeley is currently in the action stage of change. As such, her goal is to continue with weight loss efforts She has agreed to portion control better and make smarter food choices, such as increase vegetables and decrease simple carbohydrates    Roseanna has been instructed to work up to a goal of 150 minutes of combined cardio and strengthening exercise per week for weight loss and overall health benefits. We discussed the following Behavioral Modification Strategies today: increasing lean protein intake and decrease eating out   Yamileth has agreed to follow up with our clinic in 2 weeks. She was informed of the importance of frequent follow up visits to maximize her success with intensive lifestyle modifications for her multiple health conditions.   OBESITY BEHAVIORAL INTERVENTION VISIT  Today's visit was # 6 out of 22.  Starting weight: 204 lbs Starting date: 08/02/17 Today's weight : 192 lbs Today's date: 12/02/2017 Total lbs lost to date: 12 (Patients must lose 7 lbs in the first 6 months to continue with counseling)   ASK: We discussed the diagnosis of obesity with Lucille Passy today and Corine agreed to give [name] permission to discuss obesity behavioral modification therapy today.  ASSESS: Barri has the diagnosis of obesity and her BMI today is 35.11 Crystalynn is in the action stage of change   ADVISE: Georgiana was educated on the multiple health risks of obesity as well as the benefit of weight loss to improve her health. She was advised of the need for long term treatment and the importance of lifestyle modifications.  AGREE: Multiple dietary modification options and treatment options were discussed and  Angelea agreed to the above obesity treatment plan.   Wilhemena Durie, am acting as transcriptionist for Lacy Duverney, PA-C I, Lacy Duverney [facility], have reviewed this note and agree with its content

## 2017-12-08 NOTE — Telephone Encounter (Signed)
Medication sent in to the pharmacy by [place]. Faizah Kandler, [place]

## 2017-12-16 ENCOUNTER — Ambulatory Visit (INDEPENDENT_AMBULATORY_CARE_PROVIDER_SITE_OTHER): Payer: BC Managed Care – PPO | Admitting: Physician Assistant

## 2017-12-16 VITALS — BP 114/78 | HR 59 | Temp 97.9°F | Ht 62.0 in | Wt 194.0 lb

## 2017-12-16 DIAGNOSIS — Z6835 Body mass index (BMI) 35.0-35.9, adult: Secondary | ICD-10-CM

## 2017-12-16 DIAGNOSIS — E119 Type 2 diabetes mellitus without complications: Secondary | ICD-10-CM

## 2017-12-19 ENCOUNTER — Other Ambulatory Visit (INDEPENDENT_AMBULATORY_CARE_PROVIDER_SITE_OTHER): Payer: Self-pay | Admitting: Physician Assistant

## 2017-12-19 DIAGNOSIS — E559 Vitamin D deficiency, unspecified: Secondary | ICD-10-CM

## 2017-12-20 NOTE — Progress Notes (Signed)
Office: [phone]  /  Fax: [phone]   HPI:   Chief Complaint: OBESITY [name] is here to discuss her progress with her obesity treatment plan. She is on the portion control better and make smarter food choices and is following her eating plan approximately 20 % of the time. She states she is exercising 0 minutes 0 times per week. [name] has been busy taking care of her sick husband and has not been following the meal plan. She wants a different meal plan. Her weight is 194 lb (88 kg) today and has had a weight gain of 2 pounds over a period of 2 weeks since her last visit. She has lost 10 lbs since starting treatment with [name].  Diabetes II without complications, non insulin [name] has a diagnosis of diabetes type II. She is on Amaryl, Metformin and Jardiance currently. [place] states fasting BGs range between 100 and 140's and post prandial BGs range between 110 and 140's. [name] denies any hypoglycemic episodes. Last A1c was at 6.8 She has been working on intensive lifestyle modifications including diet, exercise, and weight loss to help control her blood glucose levels.  ALLERGIES: Allergies  Allergen Reactions  . Diclofenac     Hives   . Penicillins Hives    Has patient had a PCN reaction causing immediate rash, facial/tongue/throat swelling, SOB or lightheadedness with hypotension: Yes Has patient had a PCN reaction causing severe rash involving mucus membranes or skin necrosis: Yes Has patient had a PCN reaction that required hospitalization No Has patient had a PCN reaction occurring within the last 10 years: No. If all of the above answers are "NO", then may proceed with Cephalosporin use.     MEDICATIONS: Current Outpatient Medications on File Prior to Visit  Medication Sig Dispense Refill  . albuterol (PROVENTIL) (2.5 MG/3ML) 0.083% nebulizer solution Take 2.5 mg by nebulization every 6 (six) hours as needed.     Marland Kitchen amLODipine (NORVASC) 5 MG tablet Take 1 tablet (5 mg total)  by mouth daily. 30 tablet 1  . aspirin 81 MG tablet Take 81 mg by mouth daily.    . blood glucose meter kit and supplies KIT Dispense based on patient and insurance preference. Use up to four times daily as directed. (FOR ICD-9 250.00, 250.01). 1 each 0  . budesonide-formoterol (SYMBICORT) 80-4.5 MCG/ACT inhaler Take 2 puffs first thing in am and then another 2 puffs about 12 hours later. 1 Inhaler 11  . buPROPion (WELLBUTRIN SR) 150 MG 12 hr tablet Take 1 tablet (150 mg total) by mouth daily. 30 tablet 0  . empagliflozin (JARDIANCE) 25 MG TABS tablet Take 25 mg by mouth daily.    Marland Kitchen glimepiride (AMARYL) 4 MG tablet Take 4 mg by mouth daily with breakfast.    . glucose blood test strip Use as instructed 100 each 1  . losartan (COZAAR) 100 MG tablet Take 1 tablet (100 mg total) by mouth daily. 30 tablet 1  . MELATONIN PO Take 1 tablet by mouth at bedtime as needed (sleep).     . metFORMIN (GLUCOPHAGE) 1000 MG tablet Take 1,000 mg by mouth 2 (two) times daily with a meal.    . omeprazole (PRILOSEC) 40 MG capsule Take 40 mg by mouth daily.     . ONE TOUCH LANCETS MISC 1 Package by Does not apply route every morning. 100 each 0  . travoprost, benzalkonium, (TRAVATAN) 0.004 % ophthalmic solution Place 1 drop into both eyes at bedtime.    . Vitamin  D, Ergocalciferol, (DRISDOL) 50000 units CAPS capsule Take 1 capsule (50,000 Units total) by mouth every 7 (seven) days. 4 capsule 0   No current facility-administered medications on file prior to visit.     PAST MEDICAL HISTORY: Past Medical History:  Diagnosis Date  . Anemia 2006   required transfusion post TAH/BSO 05/2005  . Arthritis   . Asthma   . Chronic cough   . Colon polyps    hyperplastic  . Diabetes mellitus without complication ([place])   . Diverticulosis   . GERD (gastroesophageal reflux disease)    on prilosec  . GLA deficiency ([place])   . Glaucoma of both eyes   . Hypertension   . Joint pain   . Osteoarthritis   . Pancreatitis   .  Shortness of breath dyspnea     PAST SURGICAL HISTORY: Past Surgical History:  Procedure Laterality Date  . ABDOMINAL HYSTERECTOMY    . BREAST EXCISIONAL BIOPSY Right 1990  . COLONOSCOPY N/A 08/22/2013   Procedure: COLONOSCOPY;  Surgeon: Irene Shipper, MD;  Location[facility] ENDOSCOPY;  Service: Endoscopy;  Laterality: N/A;  . ESOPHAGOGASTRODUODENOSCOPY N/A 08/22/2013   Procedure: ESOPHAGOGASTRODUODENOSCOPY (EGD);  Surgeon: Irene Shipper, MD;  Location: [facility] ENDOSCOPY;  Service: Endoscopy;  Laterality: N/A;  . ESOPHAGOGASTRODUODENOSCOPY (EGD) WITH PROPOFOL N/A 08/19/2015   Procedure: ESOPHAGOGASTRODUODENOSCOPY (EGD) WITH PROPOFOL;  Surgeon: Irene Shipper, MD;  Location: [facility] ENDOSCOPY;  Service: Endoscopy;  Laterality: N/A;  . HERNIA REPAIR    . HYSTEROSCOPY W/D&C  01/2005   for uterine fibroids.   . INSERTION OF MESH N/A 08/24/2013   Procedure: INSERTION OF MESH;  Surgeon: Edward Jolly, MD;  Location: [place];  Service: General;  Laterality: N/A;  . JOINT REPLACEMENT Right   . LIPOMA EXCISION Left 06/21/2015   Procedure: EXCISION OF LEFT SCALP LIPOMA;  Surgeon: Johnathan Hausen, MD;  Location: [place];  Service: General;  Laterality: Left;  . PANNICULECTOMY N/A 08/24/2013   Procedure: PANNICULECTOMY;  Surgeon: Edward Jolly, MD;  Location: [place];  Service: General;  Laterality: N/A;  . TOTAL ABDOMINAL HYSTERECTOMY W/ BILATERAL SALPINGOOPHORECTOMY  05/2005  . TOTAL HIP ARTHROPLASTY Right 12/12/2013   Procedure: RIGHT TOTAL HIP ARTHROPLASTY ANTERIOR APPROACH;  Surgeon: Mcarthur Rossetti, MD;  Location: [place];  Service: Orthopedics;  Laterality: Right;  . VENTRAL HERNIA REPAIR N/A 08/24/2013   Procedure: HERNIA REPAIR VENTRAL ADULT;  Surgeon: Edward Jolly, MD;  Location: [place];  Service: General;  Laterality: N/A;    SOCIAL HISTORY: Social History   Tobacco Use  . Smoking status: Former Smoker    Packs/day: 0.25    Years: 15.00    Pack years: 3.75    Types:  Cigarettes    Last attempt to quit: 06/22/1978    Years since quitting: 39.5  . Smokeless tobacco: Never Used  Substance Use Topics  . Alcohol use: No    Alcohol/week: 0.0 oz  . Drug use: No    FAMILY HISTORY: Family History  Problem Relation Age of Onset  . Emphysema Mother        smoked  . Diabetes Mother   . Hypertension Mother   . Colon cancer Father        7-s  . Hypertension Father   . Heart disease Father   . Breast cancer Sister     ROS: Review of Systems  Constitutional: Negative for weight loss.  Endo/Heme/Allergies:       Negative for hypoglycemia  PHYSICAL EXAM: Blood pressure 114/78, pulse (!) 59, temperature 97.9 F (36.6 C), temperature source Oral, height _0  (1.575 m), weight 194 lb (88 kg), SpO2 100 %. Body mass index is 35.48 kg/m. Physical Exam  Constitutional: She is oriented to person, place, and time. She appears well-developed and well-nourished.  Cardiovascular: Normal rate.  Pulmonary/Chest: Effort normal.  Musculoskeletal: Normal range of motion.  Neurological: She is oriented to person, place, and time.  Skin: Skin is warm and dry.  Psychiatric: She has a normal mood and affect. Her behavior is normal.  Vitals reviewed.   RECENT LABS AND TESTS: BMET    Component Value Date/Time   NA 141 08/02/2017 1035   K 4.8 08/02/2017 1035   CL 102 08/02/2017 1035   CO2 24 08/02/2017 1035   GLUCOSE 120 (H) 08/02/2017 1035   GLUCOSE 91 08/16/2015 0353   BUN 14 08/02/2017 1035   CREATININE 0.94 08/02/2017 1035   CALCIUM 9.9 08/02/2017 1035   GFRNONAA 66 08/02/2017 1035   GFRAA 76 08/02/2017 1035   Lab Results  Component Value Date   HGBA1C 6.8 (H) 08/02/2017   Lab Results  Component Value Date   INSULIN 17.4 08/02/2017   CBC    Component Value Date/Time   WBC 5.7 08/02/2017 1035   WBC 5.5 08/21/2015 0332   RBC 4.76 08/02/2017 1035   RBC 4.12 08/21/2015 0332   HGB 13.4 08/02/2017 1035   HCT 40.7 08/02/2017 1035   PLT 376  08/21/2015 0332   MCV 86 08/02/2017 1035   MCH 28.2 08/02/2017 1035   MCH 28.9 08/21/2015 0332   MCHC 32.9 08/02/2017 1035   MCHC 33.5 08/21/2015 0332   RDW 14.9 08/02/2017 1035   LYMPHSABS 2.2 08/02/2017 1035   MONOABS 0.6 08/21/2015 0332   EOSABS 0.2 08/02/2017 1035   BASOSABS 0.0 08/02/2017 1035   Iron/TIBC/Ferritin/ %Sat No results found for: IRON, TIBC, FERRITIN, IRONPCTSAT Lipid Panel     Component Value Date/Time   CHOL 135 08/02/2017 1035   TRIG 65 08/02/2017 1035   HDL 54 08/02/2017 1035   CHOLHDL 2.8 08/14/2015 0417   VLDL 13 08/14/2015 0417   LDLCALC 68 08/02/2017 1035   Hepatic Function Panel     Component Value Date/Time   PROT 6.9 08/02/2017 1035   ALBUMIN 4.4 08/02/2017 1035   AST 15 08/02/2017 1035   ALT 18 08/02/2017 1035   ALKPHOS 101 08/02/2017 1035   BILITOT 0.3 08/02/2017 1035      Component Value Date/Time   TSH 2.000 08/02/2017 1035   Results for OBERA, STAUCH (MRN [id number]) as of 12/20/2017 07:21  Ref. Range 08/02/2017 10:35  Vitamin D, 25-Hydroxy Latest Ref Range: 30.0 - 100.0 ng/mL 17.9 (L)   ASSESSMENT AND PLAN: Type 2 diabetes mellitus without complication, without long-term current use of insulin (HCC)  Class 2 severe obesity with serious comorbidity and body mass index (BMI) of 35.0 to 35.9 in adult, unspecified obesity type ([place])  PLAN:  Diabetes II without complications, non insulin Izora has been given extensive diabetes education by myself today including ideal fasting and post-prandial blood glucose readings, individual ideal Hgb A1c goals and hypoglycemia prevention. We discussed the importance of good blood sugar control to decrease the likelihood of diabetic complications such as nephropathy, neuropathy, limb loss, blindness, coronary artery disease, and death. We discussed the importance of intensive lifestyle modification including diet, exercise and weight loss as the first line treatment for diabetes. Alaena agrees to  continue her diabetes  medications and will follow up at the agreed upon time.  We spent > than 50% of the 15 minute visit on the counseling as documented in the note.  Obesity Rehana is currently in the action stage of change. As such, her goal is to continue with weight loss efforts She has agreed to follow the Pescatarian eating plan Sagal has been instructed to work up to a goal of 150 minutes of combined cardio and strengthening exercise per week for weight loss and overall health benefits. We discussed the following Behavioral Modification Strategies today: increasing lean protein intake and work on meal planning and easy cooking plans  Callyn has agreed to follow up with our clinic in 2 weeks. She was informed of the importance of frequent follow up visits to maximize her success with intensive lifestyle modifications for her multiple health conditions.   OBESITY BEHAVIORAL INTERVENTION VISIT  Today's visit was # 7 out of 22.  Starting weight: 204 lbs Starting date: 08/02/17 Today's weight : 194 lbs  Today's date: 12/16/2017 Total lbs lost to date: 10 (Patients must lose 7 lbs in the first 6 months to continue with counseling)   ASK: We discussed the diagnosis of obesity with Lucille Passy today and [place] agreed to give [name] permission to discuss obesity behavioral modification therapy today.  ASSESS: Ayisha has the diagnosis of obesity and her BMI today is 35.47 Cedric is in the action stage of change   ADVISE: Gola was educated on the multiple health risks of obesity as well as the benefit of weight loss to improve her health. She was advised of the need for long term treatment and the importance of lifestyle modifications.  AGREE: Multiple dietary modification options and treatment options were discussed and  Avya agreed to the above obesity treatment plan.   Corey Skains, am acting as transcriptionist for [employer], PA-C I, [facility], have reviewed this note  and agree with its content

## 2018-01-06 ENCOUNTER — Ambulatory Visit (INDEPENDENT_AMBULATORY_CARE_PROVIDER_SITE_OTHER): Payer: BC Managed Care – PPO | Admitting: Physician Assistant

## 2018-01-06 VITALS — BP 105/69 | HR 53 | Temp 97.8°F | Ht 62.0 in | Wt 193.0 lb

## 2018-01-06 DIAGNOSIS — Z6835 Body mass index (BMI) 35.0-35.9, adult: Secondary | ICD-10-CM | POA: Diagnosis not present

## 2018-01-06 DIAGNOSIS — E1165 Type 2 diabetes mellitus with hyperglycemia: Secondary | ICD-10-CM | POA: Diagnosis not present

## 2018-01-06 DIAGNOSIS — Z9189 Other specified personal risk factors, not elsewhere classified: Secondary | ICD-10-CM | POA: Diagnosis not present

## 2018-01-06 DIAGNOSIS — E559 Vitamin D deficiency, unspecified: Secondary | ICD-10-CM | POA: Diagnosis not present

## 2018-01-06 MED ORDER — VITAMIN D (ERGOCALCIFEROL) 1.25 MG (50000 UNIT) PO CAPS: 50000.0000 [IU] | ORAL_CAPSULE | ORAL | 0 refills | Status: DC

## 2018-01-06 NOTE — Progress Notes (Signed)
Office: [phone]  /  Fax: [phone]   HPI:   Chief Complaint: OBESITY [name] is here to discuss her progress with her obesity treatment plan. She is on the Pescatarian eating plan and is following her eating plan approximately 40 % of the time. She states she is exercising 0 minutes 0 times per week. [name] continues to do well with weight loss. She states she did not like Pescatarian meal plan. She would like to try a different meal plan.  Her weight is 193 lb (87.5 kg) today and has had a weight loss of 1 pound over a period of 3 weeks since her last visit. She has lost 11 lbs since starting treatment with [name].  Vitamin D Deficiency [name] has a diagnosis of vitamin D deficiency. She is currently taking prescription Vit D and denies nausea, vomiting or muscle weakness.  Diabetes II [name] has a diagnosis of diabetes type II. [name] brings in meter for review, however, readings are not at any specific times and range from 58 to 370, at 58 when she skipped a meal. Last A1c was 6.8. She has been working on intensive lifestyle modifications including diet, exercise, and weight loss to help control her blood glucose levels.  At risk for cardiovascular disease [name] is at a higher than average risk for cardiovascular disease due to obesity and diabetes II. She currently denies any chest pain.  ALLERGIES: Allergies  Allergen Reactions  . Diclofenac     Hives   . Penicillins Hives    Has patient had a PCN reaction causing immediate rash, facial/tongue/throat swelling, SOB or lightheadedness with hypotension: Yes Has patient had a PCN reaction causing severe rash involving mucus membranes or skin necrosis: Yes Has patient had a PCN reaction that required hospitalization No Has patient had a PCN reaction occurring within the last 10 years: No. If all of the above answers are "NO", then may proceed with Cephalosporin use.     MEDICATIONS: Current Outpatient Medications on File Prior to  Visit  Medication Sig Dispense Refill  . albuterol (PROVENTIL) (2.5 MG/3ML) 0.083% nebulizer solution Take 2.5 mg by nebulization every 6 (six) hours as needed.     Marland Kitchen amLODipine (NORVASC) 5 MG tablet Take 1 tablet (5 mg total) by mouth daily. 30 tablet 1  . aspirin 81 MG tablet Take 81 mg by mouth daily.    . blood glucose meter kit and supplies KIT Dispense based on patient and insurance preference. Use up to four times daily as directed. (FOR ICD-9 250.00, 250.01). 1 each 0  . budesonide-formoterol (SYMBICORT) 80-4.5 MCG/ACT inhaler Take 2 puffs first thing in am and then another 2 puffs about 12 hours later. 1 Inhaler 11  . buPROPion (WELLBUTRIN SR) 150 MG 12 hr tablet Take 1 tablet (150 mg total) by mouth daily. 30 tablet 0  . empagliflozin (JARDIANCE) 25 MG TABS tablet Take 25 mg by mouth daily.    Marland Kitchen glimepiride (AMARYL) 4 MG tablet Take 4 mg by mouth daily with breakfast.    . glucose blood test strip Use as instructed 100 each 1  . losartan (COZAAR) 100 MG tablet Take 1 tablet (100 mg total) by mouth daily. 30 tablet 1  . MELATONIN PO Take 1 tablet by mouth at bedtime as needed (sleep).     . metFORMIN (GLUCOPHAGE) 1000 MG tablet Take 1,000 mg by mouth 2 (two) times daily with a meal.    . omeprazole (PRILOSEC) 40 MG capsule Take 40 mg by mouth  daily.     . ONE TOUCH LANCETS MISC 1 Package by Does not apply route every morning. 100 each 0  . travoprost, benzalkonium, (TRAVATAN) 0.004 % ophthalmic solution Place 1 drop into both eyes at bedtime.    . Vitamin D, Ergocalciferol, (DRISDOL) 50000 units CAPS capsule Take 1 capsule (50,000 Units total) by mouth every 7 (seven) days. 4 capsule 0   No current facility-administered medications on file prior to visit.     PAST MEDICAL HISTORY: Past Medical History:  Diagnosis Date  . Anemia 2006   required transfusion post TAH/BSO 05/2005  . Arthritis   . Asthma   . Chronic cough   . Colon polyps    hyperplastic  . Diabetes mellitus  without complication ([place])   . Diverticulosis   . GERD (gastroesophageal reflux disease)    on prilosec  . GLA deficiency ([place])   . Glaucoma of both eyes   . Hypertension   . Joint pain   . Osteoarthritis   . Pancreatitis   . Shortness of breath dyspnea     PAST SURGICAL HISTORY: Past Surgical History:  Procedure Laterality Date  . ABDOMINAL HYSTERECTOMY    . BREAST EXCISIONAL BIOPSY Right 1990  . COLONOSCOPY N/A 08/22/2013   Procedure: COLONOSCOPY;  Surgeon: Irene Shipper, MD;  Location: [facility] ENDOSCOPY;  Service: Endoscopy;  Laterality: N/A;  . ESOPHAGOGASTRODUODENOSCOPY N/A 08/22/2013   Procedure: ESOPHAGOGASTRODUODENOSCOPY (EGD);  Surgeon: Irene Shipper, MD;  Location: [facility] ENDOSCOPY;  Service: Endoscopy;  Laterality: N/A;  . ESOPHAGOGASTRODUODENOSCOPY (EGD) WITH PROPOFOL N/A 08/19/2015   Procedure: ESOPHAGOGASTRODUODENOSCOPY (EGD) WITH PROPOFOL;  Surgeon: Irene Shipper, MD;  Location: [facility] ENDOSCOPY;  Service: Endoscopy;  Laterality: N/A;  . HERNIA REPAIR    . HYSTEROSCOPY W/D&C  01/2005   for uterine fibroids.   . INSERTION OF MESH N/A 08/24/2013   Procedure: INSERTION OF MESH;  Surgeon: Edward Jolly, MD;  Location: [place];  Service: General;  Laterality: N/A;  . JOINT REPLACEMENT Right   . LIPOMA EXCISION Left 06/21/2015   Procedure: EXCISION OF LEFT SCALP LIPOMA;  Surgeon: Johnathan Hausen, MD;  Location: [place];  Service: General;  Laterality: Left;  . PANNICULECTOMY N/A 08/24/2013   Procedure: PANNICULECTOMY;  Surgeon: Edward Jolly, MD;  Location: [place];  Service: General;  Laterality: N/A;  . TOTAL ABDOMINAL HYSTERECTOMY W/ BILATERAL SALPINGOOPHORECTOMY  05/2005  . TOTAL HIP ARTHROPLASTY Right 12/12/2013   Procedure: RIGHT TOTAL HIP ARTHROPLASTY ANTERIOR APPROACH;  Surgeon: Mcarthur Rossetti, MD;  Location: [place];  Service: Orthopedics;  Laterality: Right;  . VENTRAL HERNIA REPAIR N/A 08/24/2013   Procedure: HERNIA REPAIR VENTRAL ADULT;  Surgeon: Edward Jolly, MD;  Location: [place];  Service: General;  Laterality: N/A;    SOCIAL HISTORY: Social History   Tobacco Use  . Smoking status: Former Smoker    Packs/day: 0.25    Years: 15.00    Pack years: 3.75    Types: Cigarettes    Last attempt to quit: 06/22/1978    Years since quitting: 39.5  . Smokeless tobacco: Never Used  Substance Use Topics  . Alcohol use: No    Alcohol/week: 0.0 oz  . Drug use: No    FAMILY HISTORY: Family History  Problem Relation Age of Onset  . Emphysema Mother        smoked  . Diabetes Mother   . Hypertension Mother   . Colon cancer Father        7-s  .  Hypertension Father   . Heart disease Father   . Breast cancer Sister     ROS: Review of Systems  Constitutional: Positive for weight loss.  Cardiovascular: Negative for chest pain.  Gastrointestinal: Negative for nausea and vomiting.  Musculoskeletal:       Negative muscle weakness  Endo/Heme/Allergies:       Negative hypoglycemia    PHYSICAL EXAM: Blood pressure 105/69, pulse (!) 53, temperature 97.8 F (36.6 C), temperature source Oral, height '5\' 2"'  (1.575 m), weight 193 lb (87.5 kg), SpO2 99 %. Body mass index is 35.3 kg/m. Physical Exam  Constitutional: She is oriented to person, place, and time. She appears well-developed and well-nourished.  Cardiovascular: Bradycardia present.  Pulmonary/Chest: Effort normal.  Musculoskeletal: Normal range of motion.  Neurological: She is oriented to person, place, and time.  Skin: Skin is warm and dry.  Psychiatric: She has a normal mood and affect. Her behavior is normal.  Vitals reviewed.   RECENT LABS AND TESTS: BMET    Component Value Date/Time   NA 141 08/02/2017 1035   K 4.8 08/02/2017 1035   CL 102 08/02/2017 1035   CO2 24 08/02/2017 1035   GLUCOSE 120 (H) 08/02/2017 1035   GLUCOSE 91 08/16/2015 0353   BUN 14 08/02/2017 1035   CREATININE 0.94 08/02/2017 1035   CALCIUM 9.9 08/02/2017 1035   GFRNONAA 66 08/02/2017 1035     GFRAA 76 08/02/2017 1035   Lab Results  Component Value Date   HGBA1C 6.8 (H) 08/02/2017   Lab Results  Component Value Date   INSULIN 17.4 08/02/2017   CBC    Component Value Date/Time   WBC 5.7 08/02/2017 1035   WBC 5.5 08/21/2015 0332   RBC 4.76 08/02/2017 1035   RBC 4.12 08/21/2015 0332   HGB 13.4 08/02/2017 1035   HCT 40.7 08/02/2017 1035   PLT 376 08/21/2015 0332   MCV 86 08/02/2017 1035   MCH 28.2 08/02/2017 1035   MCH 28.9 08/21/2015 0332   MCHC 32.9 08/02/2017 1035   MCHC 33.5 08/21/2015 0332   RDW 14.9 08/02/2017 1035   LYMPHSABS 2.2 08/02/2017 1035   MONOABS 0.6 08/21/2015 0332   EOSABS 0.2 08/02/2017 1035   BASOSABS 0.0 08/02/2017 1035   Iron/TIBC/Ferritin/ %Sat No results found for: IRON, TIBC, FERRITIN, IRONPCTSAT Lipid Panel     Component Value Date/Time   CHOL 135 08/02/2017 1035   TRIG 65 08/02/2017 1035   HDL 54 08/02/2017 1035   CHOLHDL 2.8 08/14/2015 0417   VLDL 13 08/14/2015 0417   LDLCALC 68 08/02/2017 1035   Hepatic Function Panel     Component Value Date/Time   PROT 6.9 08/02/2017 1035   ALBUMIN 4.4 08/02/2017 1035   AST 15 08/02/2017 1035   ALT 18 08/02/2017 1035   ALKPHOS 101 08/02/2017 1035   BILITOT 0.3 08/02/2017 1035      Component Value Date/Time   TSH 2.000 08/02/2017 1035  Results for JAYLANNI, ELTRINGHAM (MRN [id number]) as of 01/06/2018 09:27  Ref. Range 08/02/2017 10:35  Vitamin D, 25-Hydroxy Latest Ref Range: 30.0 - 100.0 ng/mL 17.9 (L)    ASSESSMENT AND PLAN: Vitamin D deficiency - Plan: VITAMIN D 25 Hydroxy (Vit-D Deficiency, Fractures), Vitamin D, Ergocalciferol, (DRISDOL) 50000 units CAPS capsule  Type 2 diabetes mellitus with hyperglycemia, without long-term current use of insulin ([name]) - Plan: Ambulatory referral to Endocrinology  At risk for heart disease  Class 2 severe obesity with serious comorbidity and body mass index (BMI) of 35.0  to 35.9 in adult, unspecified obesity type ([name])  PLAN:  Vitamin D  Deficiency Estephani was informed that low vitamin D levels contributes to fatigue and are associated with obesity, breast, and colon cancer. [name] agrees to continue taking prescription Vit D '@50' ,000 IU every week #4 and we will refill for 1 month. She will follow up for routine testing of vitamin D, at least 2-3 times per year. She was informed of the risk of over-replacement of vitamin D and agrees to not increase her dose unless she discusses this with [name] first. We will check labs and [name] agrees to follow up with our clinic in 2 weeks.  Diabetes II [name] has been given extensive diabetes education by myself today including ideal fasting and post-prandial blood glucose readings, individual ideal Hgb A1c goals and hypoglycemia prevention. We discussed the importance of good blood sugar control to decrease the likelihood of diabetic complications such as nephropathy, neuropathy, limb loss, blindness, coronary artery disease, and death. We discussed the importance of intensive lifestyle modification including diet, exercise and weight loss as the first line treatment for diabetes. We have sent a referral to Dr. Cruzita Lederer for evaluation. [name] agrees to continue her diabetes medications and she agrees to follow up with our clinic in 2 weeks.  Cardiovascular risk counselling [name] was given extended (15 minutes) coronary artery disease prevention counseling today. She is 62 y.o. female and has risk factors for heart disease including obesity and diabetes II. We discussed intensive lifestyle modifications today with an emphasis on specific weight loss instructions and strategies. Pt was also informed of the importance of increasing exercise and decreasing saturated fats to help prevent heart disease.  Obesity [name] is currently in the action stage of change. As such, her goal is to continue with weight loss efforts She has agreed to follow a lower carbohydrate, vegetable and lean protein rich diet plan [name]  has been instructed to work up to a goal of 150 minutes of combined cardio and strengthening exercise per week for weight loss and overall health benefits. We discussed the following Behavioral Modification Strategies today: increasing lean protein intake and decreasing simple carbohydrates    [name] has agreed to follow up with our clinic in 2 weeks. She was informed of the importance of frequent follow up visits to maximize her success with intensive lifestyle modifications for her multiple health conditions.   OBESITY BEHAVIORAL INTERVENTION VISIT  Today's visit was # 8 out of 22.  Starting weight: 204 lbs Starting date: 08/02/17 Today's weight : 193 lbs  Today's date: 01/06/2018 Total lbs lost to date: 11 (Patients must lose 7 lbs in the first 6 months to continue with counseling)   ASK: We discussed the diagnosis of obesity with [name] today and [name] agreed to give [name] permission to discuss obesity behavioral modification therapy today.  ASSESS: [name] has the diagnosis of obesity and her BMI today is 35.29 [name] is in the action stage of change   ADVISE: [name] was educated on the multiple health risks of obesity as well as the benefit of weight loss to improve her health. She was advised of the need for long term treatment and the importance of lifestyle modifications.  AGREE: Multiple dietary modification options and treatment options were discussed and  Breely agreed to the above obesity treatment plan.   Wilhemena Durie, am acting as transcriptionist for Lacy Duverney, PA-C I, Lacy Duverney [facility], have reviewed this note and agree with its content

## 2018-01-07 LAB — VITAMIN D 25 HYDROXY (VIT D DEFICIENCY, FRACTURES): Vit D, 25-Hydroxy: 33.1 ng/mL (ref 30.0–100.0)

## 2018-01-20 ENCOUNTER — Ambulatory Visit (INDEPENDENT_AMBULATORY_CARE_PROVIDER_SITE_OTHER): Payer: Self-pay | Admitting: Physician Assistant

## 2018-02-01 ENCOUNTER — Ambulatory Visit (INDEPENDENT_AMBULATORY_CARE_PROVIDER_SITE_OTHER): Payer: Self-pay | Admitting: Family Medicine

## 2018-02-01 ENCOUNTER — Encounter (INDEPENDENT_AMBULATORY_CARE_PROVIDER_SITE_OTHER): Payer: Self-pay

## 2018-03-05 NOTE — Progress Notes (Deleted)
Cardiology Office Note   Date:  03/05/2018   ID:  ANGELIK [name], DOB 1955-07-08, MRN [id number]  PCP:  Renee Contras, MD  Cardiologist:   Niv Darley Martinique, MD   No chief complaint on file.     History of Present Illness: [name] is a 62 y.o. female who presents for evaluation of chest pressure at the request of Dr. Dema Severin. She has a history of HTN, obesity, and DM.     Past Medical History:  Diagnosis Date  . Anemia 2006   required transfusion post TAH/BSO 05/2005  . Arthritis   . Asthma   . Chronic cough   . Colon polyps    hyperplastic  . Diabetes mellitus without complication ([place])   . Diverticulosis   . GERD (gastroesophageal reflux disease)    on prilosec  . GLA deficiency ([place])   . Glaucoma of both eyes   . Hypertension   . Joint pain   . Osteoarthritis   . Pancreatitis   . Shortness of breath dyspnea     Past Surgical History:  Procedure Laterality Date  . ABDOMINAL HYSTERECTOMY    . BREAST EXCISIONAL BIOPSY Right 1990  . COLONOSCOPY N/A 08/22/2013   Procedure: COLONOSCOPY;  Surgeon: Irene Shipper, MD;  Location: [facility] ENDOSCOPY;  Service: Endoscopy;  Laterality: N/A;  . ESOPHAGOGASTRODUODENOSCOPY N/A 08/22/2013   Procedure: ESOPHAGOGASTRODUODENOSCOPY (EGD);  Surgeon: Irene Shipper, MD;  Location: [facility] ENDOSCOPY;  Service: Endoscopy;  Laterality: N/A;  . ESOPHAGOGASTRODUODENOSCOPY (EGD) WITH PROPOFOL N/A 08/19/2015   Procedure: ESOPHAGOGASTRODUODENOSCOPY (EGD) WITH PROPOFOL;  Surgeon: Irene Shipper, MD;  Location: [facility] ENDOSCOPY;  Service: Endoscopy;  Laterality: N/A;  . HERNIA REPAIR    . HYSTEROSCOPY W/D&C  01/2005   for uterine fibroids.   . INSERTION OF MESH N/A 08/24/2013   Procedure: INSERTION OF MESH;  Surgeon: Edward Jolly, MD;  Location: [place];  Service: General;  Laterality: N/A;  . JOINT REPLACEMENT Right   . LIPOMA EXCISION Left 06/21/2015   Procedure: EXCISION OF LEFT SCALP LIPOMA;  Surgeon: Johnathan Hausen, MD;  Location: [place];  Service: General;  Laterality: Left;  . PANNICULECTOMY N/A 08/24/2013   Procedure: PANNICULECTOMY;  Surgeon: Edward Jolly, MD;  Location: [place];  Service: General;  Laterality: N/A;  . TOTAL ABDOMINAL HYSTERECTOMY W/ BILATERAL SALPINGOOPHORECTOMY  05/2005  . TOTAL HIP ARTHROPLASTY Right 12/12/2013   Procedure: RIGHT TOTAL HIP ARTHROPLASTY ANTERIOR APPROACH;  Surgeon: Mcarthur Rossetti, MD;  Location: [place];  Service: Orthopedics;  Laterality: Right;  . VENTRAL HERNIA REPAIR N/A 08/24/2013   Procedure: HERNIA REPAIR VENTRAL ADULT;  Surgeon: Edward Jolly, MD;  Location: [facility] OR;  Service: General;  Laterality: N/A;     Current Outpatient Medications  Medication Sig Dispense Refill  . albuterol (PROVENTIL) (2.5 MG/3ML) 0.083% nebulizer solution Take 2.5 mg by nebulization every 6 (six) hours as needed.     Marland Kitchen amLODipine (NORVASC) 5 MG tablet Take 1 tablet (5 mg total) by mouth daily. 30 tablet 1  . aspirin 81 MG tablet Take 81 mg by mouth daily.    . blood glucose meter kit and supplies KIT Dispense based on patient and insurance preference. Use up to four times daily as directed. (FOR ICD-9 250.00, 250.01). 1 each 0  . budesonide-formoterol (SYMBICORT) 80-4.5 MCG/ACT inhaler Take 2 puffs first thing in am and then another 2 puffs about 12 hours later. 1 Inhaler 11  . buPROPion (WELLBUTRIN SR) 150 MG  12 hr tablet Take 1 tablet (150 mg total) by mouth daily. 30 tablet 0  . empagliflozin (JARDIANCE) 25 MG TABS tablet Take 25 mg by mouth daily.    Marland Kitchen glimepiride (AMARYL) 4 MG tablet Take 4 mg by mouth daily with breakfast.    . glucose blood test strip Use as instructed 100 each 1  . losartan (COZAAR) 100 MG tablet Take 1 tablet (100 mg total) by mouth daily. 30 tablet 1  . MELATONIN PO Take 1 tablet by mouth at bedtime as needed (sleep).     . metFORMIN (GLUCOPHAGE) 1000 MG tablet Take 1,000 mg by mouth 2 (two) times daily with a meal.    . omeprazole (PRILOSEC) 40 MG capsule  Take 40 mg by mouth daily.     . ONE TOUCH LANCETS MISC 1 Package by Does not apply route every morning. 100 each 0  . travoprost, benzalkonium, (TRAVATAN) 0.004 % ophthalmic solution Place 1 drop into both eyes at bedtime.    . Vitamin D, Ergocalciferol, (DRISDOL) 50000 units CAPS capsule Take 1 capsule (50,000 Units total) by mouth every 7 (seven) days. 4 capsule 0   No current facility-administered medications for this visit.     Allergies:   Diclofenac and Penicillins    Social History:  The patient  reports that she quit smoking about 39 years ago. Her smoking use included cigarettes. She has a 3.75 pack-year smoking history. She has never used smokeless tobacco. She reports that she does not drink alcohol or use drugs.   Family History:  The patient's family history includes Breast cancer in her sister; Colon cancer in her father; Diabetes in her mother; Emphysema in her mother; Heart disease in her father; Hypertension in her father and mother.    ROS:  Please see the history of present illness.   Otherwise, review of systems are positive for none.   All other systems are reviewed and negative.    PHYSICAL EXAM: VS:  There were no vitals taken for this visit. , BMI There is no height or weight on file to calculate BMI. GEN: Well nourished, well developed, in no acute distress  HEENT: normal  Neck: no JVD, carotid bruits, or masses Cardiac: RRR; no murmurs, rubs, or gallops,no edema  Respiratory:  clear to auscultation bilaterally, normal work of breathing GI: soft, nontender, nondistended, + BS MS: no deformity or atrophy  Skin: warm and dry, no rash Neuro:  Strength and sensation are intact Psych: euthymic mood, full affect   EKG:  EKG {ACTION; IS/IS JOA:[id number]} ordered today. The ekg ordered today demonstrates ***   Recent Labs: 08/02/2017: ALT 18; BUN 14; Creatinine, Ser 0.94; Hemoglobin 13.4; Potassium 4.8; Sodium 141; TSH 2.000    Lipid Panel    Component Value  Date/Time   CHOL 135 08/02/2017 1035   TRIG 65 08/02/2017 1035   HDL 54 08/02/2017 1035   CHOLHDL 2.8 08/14/2015 0417   VLDL 13 08/14/2015 0417   LDLCALC 68 08/02/2017 1035      Wt Readings from Last 3 Encounters:  01/06/18 193 lb (87.5 kg)  12/16/17 194 lb (88 kg)  12/02/17 192 lb (87.1 kg)      Other studies Reviewed: Additional studies/ records that were reviewed today include:   Labs dated 01/28/18: cholesterol 149, triglycerides 90, HDL 53, LDL 79. A1c 7%. CBC and Chemistries normal.   ASSESSMENT AND PLAN:  1.  ***   Current medicines are reviewed at length with the patient today.  The  patient {ACTIONS; HAS/DOES NOT HAVE:19233} concerns regarding medicines.  The following changes have been made:  {PLAN; NO CHANGE:13088:s}  Labs/ tests ordered today include: *** No orders of the defined types were placed in this encounter.    Disposition:   FU with *** in {gen number 3-01:720910} {Days to years:10300}  Signed, Remiel Corti Martinique, MD  03/05/2018 11:32 AM    [facility] [address] [place], [place], [address] Phone [phone], Fax [phone]

## 2018-03-08 ENCOUNTER — Ambulatory Visit: Payer: BC Managed Care – PPO | Admitting: Cardiology

## 2018-03-09 ENCOUNTER — Encounter: Payer: Self-pay | Admitting: *Deleted

## 2018-03-17 ENCOUNTER — Ambulatory Visit: Payer: BC Managed Care – PPO | Admitting: Internal Medicine

## 2018-04-08 ENCOUNTER — Ambulatory Visit: Payer: BC Managed Care – PPO | Admitting: Internal Medicine

## 2018-04-08 DIAGNOSIS — Z0289 Encounter for other administrative examinations: Secondary | ICD-10-CM

## 2018-04-08 NOTE — Progress Notes (Deleted)
Patient ID: [name], female   DOB: 1956-06-11, 62 y.o.   MRN: [id number]   HPI: Renee DECOOK is a 62 y.o.-year-old female, referred by her PCP, Dr. Moreen Fowler, for management of DM2, dx in ***, non-insulin-dependent, uncontrolled, without long term complications.  Last hemoglobin A1c was: Lab Results  Component Value Date   HGBA1C 6.8 (H) 08/02/2017   Pt is on a regimen of: - Metformin 1000 mg 2x a day, with meals - Amaryl 4 mg 2x a day before meals - Jardiance 25 mg before b'fast  Pt checks her sugars *** a day and they are: - am: n/c - 2h after b'fast: n/c - before lunch: n/c - 2h after lunch: n/c - before dinner: n/c - 2h after dinner: n/c - bedtime: n/c - nighttime: n/c No lows. Lowest sugar was ***; she has hypoglycemia awareness at 70.  Highest sugar was ***.  Glucometer: One Touch Ultra  Pt's meals are: - Breakfast: - Lunch: - Dinner: - Snacks:  She is seen in the [facility].  - no CKD, last BUN/creatinine:  Lab Results  Component Value Date   BUN 14 08/02/2017   BUN 7 08/16/2015   CREATININE 0.94 08/02/2017   CREATININE 0.77 08/21/2015  On losartan 100.  - no HL; last set of lipids: Lab Results  Component Value Date   CHOL 135 08/02/2017   HDL 54 08/02/2017   LDLCALC 68 08/02/2017   TRIG 65 08/02/2017   CHOLHDL 2.8 08/14/2015   - last eye exam was in ***. No DR.   - no numbness and tingling in her feet.  On ASA 81.  Pt has FH of DM in ***.  Of note, she has a h/o pancreatitis.  ROS: Constitutional: no weight gain/loss, no fatigue, no subjective hyperthermia/hypothermia Eyes: no blurry vision, no xerophthalmia ENT: no sore throat, no nodules palpated in throat, no dysphagia/odynophagia, no hoarseness Cardiovascular: no CP/SOB/palpitations/leg swelling Respiratory: no cough/SOB Gastrointestinal: no N/V/D/C Musculoskeletal: no muscle/joint aches Skin: no rashes Neurological: no  tremors/numbness/tingling/dizziness Psychiatric: no depression/anxiety  PE: There were no vitals taken for this visit. Wt Readings from Last 3 Encounters:  01/06/18 193 lb (87.5 kg)  12/16/17 194 lb (88 kg)  12/02/17 192 lb (87.1 kg)   Constitutional: overweight, in NAD Eyes: PERRLA, EOMI, no exophthalmos ENT: moist mucous membranes, no thyromegaly, no cervical lymphadenopathy Cardiovascular: RRR, No MRG Respiratory: CTA B Gastrointestinal: abdomen soft, NT, ND, BS+ Musculoskeletal: no deformities, strength intact in all 4 Skin: moist, warm, no rashes Neurological: no tremor with outstretched hands, DTR normal in all 4  ASSESSMENT: 1. DM2, non-insulin-dependent, uncontrolled, without long-term complications, but with hyperglycemia  PLAN:  1. Patient with long-standing, uncontrolled diabetes, on oral antidiabetic regimen, which became insufficient. - I suggested to:  There are no Patient Instructions on file for this visit. - Strongly advised her to start checking sugars at different times of the day - check 1x a day, rotating checks - given sugar log and advised how to fill it and to bring it at next appt  - given foot care handout and explained the principles  - given instructions for hypoglycemia management "15-15 rule"  - advised for yearly eye exams  - Return to clinic in 3 mo with sugar log   Renee Kingdom, MD PhD [facility] Endocrinology

## 2018-06-17 ENCOUNTER — Emergency Department (HOSPITAL_COMMUNITY)
Admission: EM | Admit: 2018-06-17 | Discharge: 2018-06-18 | Disposition: A | Discharge status: Home / Self Care | Payer: BC Managed Care – PPO | Attending: Emergency Medicine | Admitting: Emergency Medicine

## 2018-06-17 ENCOUNTER — Emergency Department (HOSPITAL_COMMUNITY): Payer: BC Managed Care – PPO

## 2018-06-17 ENCOUNTER — Encounter (HOSPITAL_COMMUNITY): Payer: Self-pay | Admitting: Emergency Medicine

## 2018-06-17 ENCOUNTER — Other Ambulatory Visit: Payer: Self-pay

## 2018-06-17 DIAGNOSIS — Z7984 Long term (current) use of oral hypoglycemic drugs: Secondary | ICD-10-CM | POA: Insufficient documentation

## 2018-06-17 DIAGNOSIS — Z79899 Other long term (current) drug therapy: Secondary | ICD-10-CM | POA: Insufficient documentation

## 2018-06-17 DIAGNOSIS — R1010 Upper abdominal pain, unspecified: Secondary | ICD-10-CM | POA: Diagnosis present

## 2018-06-17 DIAGNOSIS — K859 Acute pancreatitis without necrosis or infection, unspecified: Secondary | ICD-10-CM

## 2018-06-17 DIAGNOSIS — E119 Type 2 diabetes mellitus without complications: Secondary | ICD-10-CM | POA: Insufficient documentation

## 2018-06-17 DIAGNOSIS — Z7982 Long term (current) use of aspirin: Secondary | ICD-10-CM | POA: Diagnosis not present

## 2018-06-17 DIAGNOSIS — I10 Essential (primary) hypertension: Secondary | ICD-10-CM | POA: Insufficient documentation

## 2018-06-17 DIAGNOSIS — Z87891 Personal history of nicotine dependence: Secondary | ICD-10-CM | POA: Insufficient documentation

## 2018-06-17 DIAGNOSIS — Z96641 Presence of right artificial hip joint: Secondary | ICD-10-CM | POA: Diagnosis not present

## 2018-06-17 LAB — COMPREHENSIVE METABOLIC PANEL
ALT: 13 U/L (ref 0–44)
AST: 13 U/L — ABNORMAL LOW (ref 15–41)
Albumin: 3.8 g/dL (ref 3.5–5.0)
Alkaline Phosphatase: 72 U/L (ref 38–126)
Anion gap: 6 (ref 5–15)
BUN: 14 mg/dL (ref 8–23)
CO2: 31 mmol/L (ref 22–32)
Calcium: 9.6 mg/dL (ref 8.9–10.3)
Chloride: 105 mmol/L (ref 98–111)
Creatinine, Ser: 0.86 mg/dL (ref 0.44–1.00)
GFR calc Af Amer: >60 mL/min (ref >60)
GFR calc non Af Amer: >60 mL/min (ref >60)
Glucose, Bld: 104 mg/dL — ABNORMAL HIGH (ref 70–99)
Potassium: 3.7 mmol/L (ref 3.5–5.1)
Sodium: 142 mmol/L (ref 135–145)
Total Bilirubin: 0.5 mg/dL (ref 0.3–1.2)
Total Protein: 7 g/dL (ref 6.5–8.1)

## 2018-06-17 LAB — CBC
HCT: 43.6 % (ref 36.0–46.0)
Hemoglobin: 13.6 g/dL (ref 12.0–15.0)
MCH: 27.9 pg (ref 26.0–34.0)
MCHC: 31.2 g/dL (ref 30.0–36.0)
MCV: 89.5 fL (ref 80.0–100.0)
Platelets: 341 10*3/uL (ref 150–400)
RBC: 4.87 MIL/uL (ref 3.87–5.11)
RDW: 14.1 % (ref 11.5–15.5)
WBC: 7.5 10*3/uL (ref 4.0–10.5)
nRBC: 0 % (ref 0.0–0.2)

## 2018-06-17 LAB — URINALYSIS, ROUTINE W REFLEX MICROSCOPIC
Bilirubin Urine: NEGATIVE
Glucose, UA: >=500 mg/dL — AB
Hgb urine dipstick: NEGATIVE
Ketones, ur: 5 mg/dL — AB
Leukocytes, UA: NEGATIVE
Nitrite: NEGATIVE
Protein, ur: NEGATIVE mg/dL
Specific Gravity, Urine: >1.046 — ABNORMAL HIGH (ref 1.005–1.030)
pH: 5 (ref 5.0–8.0)

## 2018-06-17 LAB — LIPASE, BLOOD: Lipase: 57 U/L — ABNORMAL HIGH (ref 11–51)

## 2018-06-17 MED ORDER — ONDANSETRON 4 MG PO TBDP: 4.0000 mg | ORAL_TABLET | Freq: Three times a day (TID) | ORAL | 0 refills | Status: DC | PRN

## 2018-06-17 MED ORDER — SODIUM CHLORIDE 0.9 % IV BOLUS
1000.0000 mL | Freq: Once | INTRAVENOUS | Status: AC
  Administered 2018-06-17: 1000 mL via INTRAVENOUS

## 2018-06-17 MED ORDER — MORPHINE SULFATE (PF) 4 MG/ML IV SOLN
4.0000 mg | Freq: Once | INTRAVENOUS | Status: AC
  Administered 2018-06-17: 4 mg via INTRAVENOUS
  Filled 2018-06-17: qty 1

## 2018-06-17 MED ORDER — ONDANSETRON HCL 4 MG/2ML IJ SOLN
4.0000 mg | Freq: Once | INTRAMUSCULAR | Status: AC
  Administered 2018-06-17: 4 mg via INTRAVENOUS
  Filled 2018-06-17: qty 2

## 2018-06-17 MED ORDER — IOHEXOL 300 MG/ML  SOLN
100.0000 mL | Freq: Once | INTRAMUSCULAR | Status: AC | PRN
  Administered 2018-06-17: 100 mL via INTRAVENOUS

## 2018-06-17 MED ORDER — HYDROCODONE-ACETAMINOPHEN 5-325 MG PO TABS: 1.0000 | ORAL_TABLET | ORAL | 0 refills | Status: DC | PRN

## 2018-06-17 NOTE — ED Triage Notes (Signed)
Pt reports generalized abdominal and back pain that started 5 days ago. Pt reports taking tylenol 3 with codeine at home for pain. States she went to her doctor today and they sent her here for possible pancreatitis to which she has a hx of. Denies n/v/d

## 2018-06-17 NOTE — ED Provider Notes (Signed)
[facility] EMERGENCY DEPARTMENT Provider Note   CSN: [id number] Arrival date & time: 06/17/18  1613     History   Chief Complaint Chief Complaint  Patient presents with  . Abdominal Pain    HPI [name] is a 62 y.o. female.  Pt presents to the ED today with abdominal pain.  Pt said it is in her upper abdomen and radiates to her back.  She has had pancreatitis in the past, and is worried she has it again.  She said it was from her blood pressure medication which she does not take any more.  She denies n/v/d.  No f/c.  She is not an drinker of alcohol.     Past Medical History:  Diagnosis Date  . Anemia 2006   required transfusion post TAH/BSO 05/2005  . Arthritis   . Asthma   . Chronic cough   . Colon polyps    hyperplastic  . Diabetes mellitus without complication ([place])   . Diverticulosis   . GERD (gastroesophageal reflux disease)    on prilosec  . GLA deficiency ([place])   . Glaucoma of both eyes   . Hypertension   . Joint pain   . Osteoarthritis   . Pancreatitis   . Shortness of breath dyspnea     Patient Active Problem List   Diagnosis Date Noted  . Other fatigue 08/02/2017  . Shortness of breath on exertion 08/02/2017  . Abdominal pain, epigastric   . Abnormal CT of the abdomen   . Pancreatitis, acute   . Acute pancreatitis 08/13/2015  . UTI (lower urinary tract infection) 08/13/2015  . Severe obesity (BMI >= 40) ([place]) 01/23/2015  . Sinusitis, chronic 01/08/2015  . Cough variant asthma 01/03/2015  . Arthritis of right hip 12/12/2013  . 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 Benign neoplasm of colon 08/22/2013  . Special screening for malignant neoplasms, colon 08/22/2013  . Abdominal pain 08/19/2013  . Diabetes mellitus ([place]) 08/19/2013  . Essential hypertension, benign 08/19/2013  . Ventral hernia 08/19/2013    Past Surgical History:  Procedure Laterality Date  . ABDOMINAL HYSTERECTOMY    . BREAST  EXCISIONAL BIOPSY Right 1990  . COLONOSCOPY N/A 08/22/2013   Procedure: COLONOSCOPY;  Surgeon: Irene Shipper, MD;  Location[facility] ENDOSCOPY;  Service: Endoscopy;  Laterality: N/A;  . ESOPHAGOGASTRODUODENOSCOPY N/A 08/22/2013   Procedure: ESOPHAGOGASTRODUODENOSCOPY (EGD);  Surgeon: Irene Shipper, MD;  Location[facility] ENDOSCOPY;  Service: Endoscopy;  Laterality: N/A;  . ESOPHAGOGASTRODUODENOSCOPY (EGD) WITH PROPOFOL N/A 08/19/2015   Procedure: ESOPHAGOGASTRODUODENOSCOPY (EGD) WITH PROPOFOL;  Surgeon: Irene Shipper, MD;  Location: [facility] ENDOSCOPY;  Service: Endoscopy;  Laterality: N/A;  . HERNIA REPAIR    . HYSTEROSCOPY W/D&C  01/2005   for uterine fibroids.   . INSERTION OF MESH N/A 08/24/2013   Procedure: INSERTION OF MESH;  Surgeon: Edward Jolly, MD;  Location: [place];  Service: General;  Laterality: N/A;  . JOINT REPLACEMENT Right   . LIPOMA EXCISION Left 06/21/2015   Procedure: EXCISION OF LEFT SCALP LIPOMA;  Surgeon: Johnathan Hausen, MD;  Location: [place];  Service: General;  Laterality: Left;  . PANNICULECTOMY N/A 08/24/2013   Procedure: PANNICULECTOMY;  Surgeon: Edward Jolly, MD;  Location: [place];  Service: General;  Laterality: N/A;  . TOTAL ABDOMINAL HYSTERECTOMY W/ BILATERAL SALPINGOOPHORECTOMY  05/2005  . TOTAL HIP ARTHROPLASTY Right 12/12/2013   Procedure: RIGHT TOTAL HIP ARTHROPLASTY ANTERIOR APPROACH;  Surgeon: Mcarthur Rossetti,  MD;  Location: [place];  Service: Orthopedics;  Laterality: Right;  . VENTRAL HERNIA REPAIR N/A 08/24/2013   Procedure: HERNIA REPAIR VENTRAL ADULT;  Surgeon: Edward Jolly, MD;  Location: [facility] OR;  Service: General;  Laterality: N/A;     OB History    Gravida  5   Para      Term      Preterm      AB      Living  5     SAB      TAB      Ectopic      Multiple      Live Births               Home Medications    Prior to Admission medications   Medication Sig Start Date End Date Taking? Authorizing Provider    albuterol (PROVENTIL) (2.5 MG/3ML) 0.083% nebulizer solution Take 2.5 mg by nebulization every 6 (six) hours as needed.  07/13/15   [provider]  amLODipine (NORVASC) 5 MG tablet Take 1 tablet (5 mg total) by mouth daily. 08/21/15   Orson Eva, MD  aspirin 81 MG tablet Take 81 mg by mouth daily.    [provider]  blood glucose meter kit and supplies KIT Dispense based on patient and insurance preference. Use up to four times daily as directed. (FOR ICD-9 250.00, 250.01). 08/02/17   Dennard Nip D, MD  budesonide-formoterol [facility]) 80-4.5 MCG/ACT inhaler Take 2 puffs first thing in am and then another 2 puffs about 12 hours later. 01/03/15   Tanda Rockers, MD  buPROPion (WELLBUTRIN SR) 150 MG 12 hr tablet Take 1 tablet (150 mg total) by mouth daily. 12/06/17   Waldon Merl, PA-C  empagliflozin (JARDIANCE) 25 MG TABS tablet Take 25 mg by mouth daily.    [provider]  glimepiride (AMARYL) 4 MG tablet Take 4 mg by mouth daily with breakfast.    [provider]  glucose blood test strip Use as instructed 08/25/17   Dennard Nip D, MD  HYDROcodone-acetaminophen (NORCO/VICODIN) 5-325 MG tablet Take 1 tablet by mouth every 4 (four) hours as needed. 06/17/18   Isla Pence, MD  losartan (COZAAR) 100 MG tablet Take 1 tablet (100 mg total) by mouth daily. 08/21/15   Orson Eva, MD  MELATONIN PO Take 1 tablet by mouth at bedtime as needed (sleep).     [provider]  metFORMIN (GLUCOPHAGE) 1000 MG tablet Take 1,000 mg by mouth 2 (two) times daily with a meal.    [provider]  omeprazole (PRILOSEC) 40 MG capsule Take 40 mg by mouth daily.  07/13/15   [provider]  ondansetron (ZOFRAN ODT) 4 MG disintegrating tablet Take 1 tablet (4 mg total) by mouth every 8 (eight) hours as needed. 06/17/18   Isla Pence, MD  ONE TOUCH LANCETS MISC 1 Package by Does not apply route every morning. 08/02/17   Dennard Nip D, MD  travoprost,  benzalkonium, (TRAVATAN) 0.004 % ophthalmic solution Place 1 drop into both eyes at bedtime.    [provider]  Vitamin D, Ergocalciferol, (DRISDOL) 50000 units CAPS capsule Take 1 capsule (50,000 Units total) by mouth every 7 (seven) days. 01/06/18   Waldon Merl, PA-C    Family History Family History  Problem Relation Age of Onset  . Emphysema Mother        smoked  . Diabetes Mother   . Hypertension Mother   . Colon cancer  Father        7-s  . Hypertension Father   . Heart disease Father   . Breast cancer Sister     Social History Social History   Tobacco Use  . Smoking status: Former Smoker    Packs/day: 0.25    Years: 15.00    Pack years: 3.75    Types: Cigarettes    Last attempt to quit: 06/22/1978    Years since quitting: 40.0  . Smokeless tobacco: Never Used  Substance Use Topics  . Alcohol use: No    Alcohol/week: 0.0 standard drinks  . Drug use: No     Allergies   Diclofenac and Penicillins   Review of Systems Review of Systems  Gastrointestinal: Positive for abdominal pain.  All other systems reviewed and are negative.    Physical Exam Updated Vital Signs BP 137/85 (BP Location: Left Arm)   Pulse 67   Temp 98.3 F (36.8 C) (Oral)   Resp 14   SpO2 100%   Physical Exam Vitals signs and nursing note reviewed.  Constitutional:      Appearance: She is well-developed and normal weight.  HENT:     Head: Normocephalic and atraumatic.     Mouth/Throat:     Mouth: Mucous membranes are moist.     Pharynx: Oropharynx is clear.  Eyes:     Extraocular Movements: Extraocular movements intact.     Pupils: Pupils are equal, round, and reactive to light.  Cardiovascular:     Rate and Rhythm: Normal rate and regular rhythm.  Pulmonary:     Effort: Pulmonary effort is normal.     Breath sounds: Normal breath sounds.  Abdominal:     General: Abdomen is flat. Bowel sounds are normal.     Palpations: Abdomen is soft.     Tenderness: There is  generalized abdominal tenderness.  Skin:    General: Skin is warm and dry.     Capillary Refill: Capillary refill takes less than 2 seconds.  Neurological:     General: No focal deficit present.     Mental Status: She is alert and oriented to person, place, and time.  Psychiatric:        Mood and Affect: Mood normal.        Behavior: Behavior normal.      ED Treatments / Results  Labs (all labs ordered are listed, but only abnormal results are displayed) Labs Reviewed  LIPASE, BLOOD - Abnormal; Notable for the following components:      Result Value   Lipase 57 (*)    All other components within normal limits  COMPREHENSIVE METABOLIC PANEL - Abnormal; Notable for the following components:   Glucose, Bld 104 (*)    AST 13 (*)    All other components within normal limits  CBC  URINALYSIS, ROUTINE W REFLEX MICROSCOPIC    EKG EKG Interpretation  Date/Time:  Friday June 17 2018 17:20:23 EST Ventricular Rate:  64 PR Interval:  120 QRS Duration: 86 QT Interval:  426 QTC Calculation: 439 R Axis:   30 Text Interpretation:  Normal sinus rhythm Normal ECG Confirmed by Isla Pence [phone]) on 06/17/2018 11:17:05 PM   Radiology Ct Abdomen Pelvis W Contrast  Result Date: 06/17/2018 CLINICAL DATA:  Generalized abdominal pain for 5 days. EXAM: CT ABDOMEN AND PELVIS WITH CONTRAST TECHNIQUE: Multidetector CT imaging of the abdomen and pelvis was performed using the standard protocol following bolus administration of intravenous contrast. CONTRAST:  164m OMNIPAQUE IOHEXOL 300 MG/ML  SOLN COMPARISON:  08/13/2015 FINDINGS: Lower chest: No acute abnormality. Hepatobiliary: The dome of the right hepatic lobe is incompletely included this study. The liver is otherwise homogeneous in appearance without space-occupying mass. The gallbladder is normal. Pancreas: Trace edema about the pancreatic head, uncinate process proximal body. Ductal dilatation or mass. Spleen: Normal  Adrenals/Urinary Tract: Adrenal glands are unremarkable. Kidneys are normal, without renal calculi, focal lesion, or hydronephrosis. Bladder is unremarkable. Stomach/Bowel: Stomach is within normal limits. Appendix appears normal. No evidence of bowel wall thickening, distention, or inflammatory changes. Moderate stool retention within the colon suspicious for constipation. Vascular/Lymphatic: Aortic atherosclerosis. No enlarged abdominal or pelvic lymph nodes. Reproductive: Status post hysterectomy. No adnexal masses. Other: Ventral mesh repair without recurrence of hernia noted. Midline ventral scarring overlying lower abdomen and pelvis. No free air free fluid. Musculoskeletal: No acute or significant osseous findings. IMPRESSION: 1. Slight peripancreatic edema suspicious for pancreatitis without complicating features. 2. Moderate stool retention noted within the colon. 3. Status post ventral mesh repair without recurrence of hernia noted. Right hip arthroplasty obscures portions of the right hemipelvis. Electronically Signed   By: Ashley Royalty M.D.   On: 06/17/2018 23:30    Procedures Procedures (including critical care time)  Medications Ordered in ED Medications  sodium chloride 0.9 % bolus 1,000 mL (1,000 mLs Intravenous New Bag/Given 06/17/18 2240)  morphine 4 MG/ML injection 4 mg (4 mg Intravenous Given 06/17/18 2240)  ondansetron (ZOFRAN) injection 4 mg (4 mg Intravenous Given 06/17/18 2240)  iohexol (OMNIPAQUE) 300 MG/ML solution 100 mL (100 mLs Intravenous Contrast Given 06/17/18 2258)     Initial Impression / Assessment and Plan / ED Course  I have reviewed the triage vital signs and the nursing notes.  Pertinent labs & imaging results that were available during my care of the patient were reviewed by me and considered in my medical decision making (see chart for details).  Pt does have acute pancreatitis.  She has seen Dr. Henrene Pastor for pancreatitis in the past and it was thought to be  due to HCTZ.  Gallbladder looks ok today.  She does not drink alcohol.  Etiology unclear.   Pain is well controlled.  She will go home with pain meds and nausea meds.  She is instructed to f/u with pcp.  Return if worse.  She is instructed to also f/u with Dr. Henrene Pastor.  Final Clinical Impressions(s) / ED Diagnoses   Final diagnoses:  Acute pancreatitis, unspecified complication status, unspecified pancreatitis type    ED Discharge Orders         Ordered    HYDROcodone-acetaminophen (NORCO/VICODIN) 5-325 MG tablet  Every 4 hours PRN     06/17/18 2343    ondansetron (ZOFRAN ODT) 4 MG disintegrating tablet  Every 8 hours PRN     06/17/18 2343           Isla Pence, MD 06/17/18 2343

## 2018-06-18 MED ORDER — HYDROCODONE-ACETAMINOPHEN 5-325 MG PO TABS
1.0000 | ORAL_TABLET | Freq: Once | ORAL | Status: AC
  Administered 2018-06-18: 1 via ORAL
  Filled 2018-06-18: qty 1

## 2018-06-18 NOTE — ED Notes (Signed)
The pt angry at the edp she feels like she should get something else for pain.  She had taken all her bp cuff pulse ox off.  I had to ask dr Regenia Skeeter for the pain med.  He gave her a vicodin.  Pt unhappy that she was not admitted  She was asking for the doctors name very unhappy with the care

## 2018-06-18 NOTE — ED Notes (Signed)
Pt called back from pharmacy (sound of TV in back ground) stating that they had not called in prescription. Pt was very angry, RN attempted to de-esclate.  RN opened chart and explained that it was sent to the pharmacy and which one.  She was angry b/c they were not open and she was in great pain. RN asked if pain medication was given, she denied however RN looked at chart and explained that them medication they gave should last until the pharmacy opens.  Pt was very angry and began yelling obscenities at RN.  RN requested that she stop or the phone call would be ended which it was.

## 2018-06-19 ENCOUNTER — Other Ambulatory Visit: Payer: Self-pay

## 2018-06-19 ENCOUNTER — Encounter (HOSPITAL_COMMUNITY): Payer: Self-pay | Admitting: Emergency Medicine

## 2018-06-19 ENCOUNTER — Emergency Department (HOSPITAL_COMMUNITY): Payer: BC Managed Care – PPO

## 2018-06-19 ENCOUNTER — Inpatient Hospital Stay (HOSPITAL_COMMUNITY)
Admission: EM | Admit: 2018-06-19 | Discharge: 2018-06-23 | DRG: 440 | Disposition: A | Discharge status: Home / Self Care | Payer: BC Managed Care – PPO | Attending: Internal Medicine | Admitting: Internal Medicine

## 2018-06-19 DIAGNOSIS — R109 Unspecified abdominal pain: Secondary | ICD-10-CM

## 2018-06-19 DIAGNOSIS — Z833 Family history of diabetes mellitus: Secondary | ICD-10-CM

## 2018-06-19 DIAGNOSIS — Z880 Allergy status to penicillin: Secondary | ICD-10-CM

## 2018-06-19 DIAGNOSIS — Z90710 Acquired absence of both cervix and uterus: Secondary | ICD-10-CM

## 2018-06-19 DIAGNOSIS — Z803 Family history of malignant neoplasm of breast: Secondary | ICD-10-CM

## 2018-06-19 DIAGNOSIS — M1990 Unspecified osteoarthritis, unspecified site: Secondary | ICD-10-CM | POA: Diagnosis present

## 2018-06-19 DIAGNOSIS — Z7951 Long term (current) use of inhaled steroids: Secondary | ICD-10-CM

## 2018-06-19 DIAGNOSIS — H409 Unspecified glaucoma: Secondary | ICD-10-CM | POA: Diagnosis present

## 2018-06-19 DIAGNOSIS — I10 Essential (primary) hypertension: Secondary | ICD-10-CM

## 2018-06-19 DIAGNOSIS — R1013 Epigastric pain: Secondary | ICD-10-CM

## 2018-06-19 DIAGNOSIS — R1011 Right upper quadrant pain: Secondary | ICD-10-CM | POA: Diagnosis not present

## 2018-06-19 DIAGNOSIS — K219 Gastro-esophageal reflux disease without esophagitis: Secondary | ICD-10-CM | POA: Diagnosis present

## 2018-06-19 DIAGNOSIS — Z825 Family history of asthma and other chronic lower respiratory diseases: Secondary | ICD-10-CM

## 2018-06-19 DIAGNOSIS — K8500 Idiopathic acute pancreatitis without necrosis or infection: Secondary | ICD-10-CM | POA: Diagnosis not present

## 2018-06-19 DIAGNOSIS — Z888 Allergy status to other drugs, medicaments and biological substances status: Secondary | ICD-10-CM

## 2018-06-19 DIAGNOSIS — K5900 Constipation, unspecified: Secondary | ICD-10-CM | POA: Diagnosis not present

## 2018-06-19 DIAGNOSIS — E860 Dehydration: Secondary | ICD-10-CM | POA: Diagnosis present

## 2018-06-19 DIAGNOSIS — K5790 Diverticulosis of intestine, part unspecified, without perforation or abscess without bleeding: Secondary | ICD-10-CM | POA: Diagnosis present

## 2018-06-19 DIAGNOSIS — Z79899 Other long term (current) drug therapy: Secondary | ICD-10-CM

## 2018-06-19 DIAGNOSIS — E119 Type 2 diabetes mellitus without complications: Secondary | ICD-10-CM | POA: Diagnosis present

## 2018-06-19 DIAGNOSIS — J45991 Cough variant asthma: Secondary | ICD-10-CM | POA: Diagnosis not present

## 2018-06-19 DIAGNOSIS — E876 Hypokalemia: Secondary | ICD-10-CM | POA: Diagnosis not present

## 2018-06-19 DIAGNOSIS — Z7984 Long term (current) use of oral hypoglycemic drugs: Secondary | ICD-10-CM

## 2018-06-19 DIAGNOSIS — Z8249 Family history of ischemic heart disease and other diseases of the circulatory system: Secondary | ICD-10-CM

## 2018-06-19 DIAGNOSIS — Z96641 Presence of right artificial hip joint: Secondary | ICD-10-CM | POA: Diagnosis present

## 2018-06-19 DIAGNOSIS — Z8719 Personal history of other diseases of the digestive system: Secondary | ICD-10-CM

## 2018-06-19 DIAGNOSIS — Z800 Family history of malignant neoplasm of digestive organs: Secondary | ICD-10-CM

## 2018-06-19 DIAGNOSIS — Z87891 Personal history of nicotine dependence: Secondary | ICD-10-CM

## 2018-06-19 DIAGNOSIS — K859 Acute pancreatitis without necrosis or infection, unspecified: Secondary | ICD-10-CM

## 2018-06-19 LAB — CBC WITH DIFFERENTIAL/PLATELET
Abs Immature Granulocytes: 0.02 10*3/uL (ref 0.00–0.07)
Basophils Absolute: 0 10*3/uL (ref 0.0–0.1)
Basophils Relative: 0 %
Eosinophils Absolute: 0.1 10*3/uL (ref 0.0–0.5)
Eosinophils Relative: 1 %
HCT: 42.9 % (ref 36.0–46.0)
Hemoglobin: 13.6 g/dL (ref 12.0–15.0)
Immature Granulocytes: 0 %
Lymphocytes Relative: 24 %
Lymphs Abs: 1.9 10*3/uL (ref 0.7–4.0)
MCH: 28.9 pg (ref 26.0–34.0)
MCHC: 31.7 g/dL (ref 30.0–36.0)
MCV: 91.1 fL (ref 80.0–100.0)
Monocytes Absolute: 0.4 10*3/uL (ref 0.1–1.0)
Monocytes Relative: 5 %
Neutro Abs: 5.5 10*3/uL (ref 1.7–7.7)
Neutrophils Relative %: 70 %
Platelets: 298 10*3/uL (ref 150–400)
RBC: 4.71 MIL/uL (ref 3.87–5.11)
RDW: 14 % (ref 11.5–15.5)
WBC: 7.9 10*3/uL (ref 4.0–10.5)
nRBC: 0 % (ref 0.0–0.2)

## 2018-06-19 LAB — URINALYSIS, ROUTINE W REFLEX MICROSCOPIC
Bilirubin Urine: NEGATIVE
Glucose, UA: >=500 mg/dL — AB
Hgb urine dipstick: NEGATIVE
Ketones, ur: 20 mg/dL — AB
Leukocytes, UA: NEGATIVE
Nitrite: NEGATIVE
Protein, ur: NEGATIVE mg/dL
Specific Gravity, Urine: 1.033 — ABNORMAL HIGH (ref 1.005–1.030)
pH: 5 (ref 5.0–8.0)

## 2018-06-19 LAB — LIPASE, BLOOD: Lipase: 48 U/L (ref 11–51)

## 2018-06-19 LAB — GLUCOSE, CAPILLARY
Glucose-Capillary: 103 mg/dL — ABNORMAL HIGH (ref 70–99)
Glucose-Capillary: 115 mg/dL — ABNORMAL HIGH (ref 70–99)
Glucose-Capillary: 118 mg/dL — ABNORMAL HIGH (ref 70–99)

## 2018-06-19 LAB — COMPREHENSIVE METABOLIC PANEL
ALT: 14 U/L (ref 0–44)
AST: 13 U/L — ABNORMAL LOW (ref 15–41)
Albumin: 4 g/dL (ref 3.5–5.0)
Alkaline Phosphatase: 66 U/L (ref 38–126)
Anion gap: 9 (ref 5–15)
BUN: 14 mg/dL (ref 8–23)
CO2: 26 mmol/L (ref 22–32)
Calcium: 9.2 mg/dL (ref 8.9–10.3)
Chloride: 104 mmol/L (ref 98–111)
Creatinine, Ser: 0.82 mg/dL (ref 0.44–1.00)
GFR calc Af Amer: >60 mL/min (ref >60)
GFR calc non Af Amer: >60 mL/min (ref >60)
Glucose, Bld: 128 mg/dL — ABNORMAL HIGH (ref 70–99)
Potassium: 3.5 mmol/L (ref 3.5–5.1)
Sodium: 139 mmol/L (ref 135–145)
Total Bilirubin: 0.6 mg/dL (ref 0.3–1.2)
Total Protein: 7.5 g/dL (ref 6.5–8.1)

## 2018-06-19 LAB — I-STAT CG4 LACTIC ACID, ED: Lactic Acid, Venous: 0.49 mmol/L — ABNORMAL LOW (ref 0.5–1.9)

## 2018-06-19 MED ORDER — LOSARTAN POTASSIUM 50 MG PO TABS
100.0000 mg | ORAL_TABLET | Freq: Every day | ORAL | Status: DC
  Administered 2018-06-19 – 2018-06-22 (×4): 100 mg via ORAL
  Filled 2018-06-19 (×6): qty 2

## 2018-06-19 MED ORDER — PANTOPRAZOLE SODIUM 40 MG PO TBEC
40.0000 mg | DELAYED_RELEASE_TABLET | Freq: Every day | ORAL | Status: DC
  Administered 2018-06-20 – 2018-06-23 (×4): 40 mg via ORAL
  Filled 2018-06-19 (×5): qty 1

## 2018-06-19 MED ORDER — ENOXAPARIN SODIUM 40 MG/0.4ML ~~LOC~~ SOLN
40.0000 mg | SUBCUTANEOUS | Status: DC
  Administered 2018-06-19 – 2018-06-22 (×4): 40 mg via SUBCUTANEOUS
  Filled 2018-06-19 (×5): qty 0.4

## 2018-06-19 MED ORDER — BISACODYL 10 MG RE SUPP
10.0000 mg | Freq: Every day | RECTAL | Status: DC | PRN
  Filled 2018-06-19: qty 1

## 2018-06-19 MED ORDER — AMLODIPINE BESYLATE 5 MG PO TABS
5.0000 mg | ORAL_TABLET | Freq: Every day | ORAL | Status: DC
  Administered 2018-06-19 – 2018-06-22 (×4): 5 mg via ORAL
  Filled 2018-06-19 (×6): qty 1

## 2018-06-19 MED ORDER — SENNA 8.6 MG PO TABS
2.0000 | ORAL_TABLET | Freq: Two times a day (BID) | ORAL | Status: DC
  Administered 2018-06-19 – 2018-06-20 (×2): 17.2 mg via ORAL
  Filled 2018-06-19 (×3): qty 2

## 2018-06-19 MED ORDER — ONDANSETRON HCL 4 MG/2ML IJ SOLN
4.0000 mg | Freq: Once | INTRAMUSCULAR | Status: AC
  Administered 2018-06-19: 4 mg via INTRAVENOUS
  Filled 2018-06-19: qty 2

## 2018-06-19 MED ORDER — KCL IN DEXTROSE-NACL 20-5-0.9 MEQ/L-%-% IV SOLN
INTRAVENOUS | Status: AC
  Administered 2018-06-19 – 2018-06-20 (×3): via INTRAVENOUS
  Filled 2018-06-19 (×2): qty 1000

## 2018-06-19 MED ORDER — SODIUM CHLORIDE 0.9 % IV SOLN
Freq: Once | INTRAVENOUS | Status: AC
  Administered 2018-06-19: 100 mL/h via INTRAVENOUS

## 2018-06-19 MED ORDER — ALBUTEROL SULFATE (2.5 MG/3ML) 0.083% IN NEBU: 2.5000 mg | INHALATION_SOLUTION | RESPIRATORY_TRACT | Status: DC | PRN

## 2018-06-19 MED ORDER — LATANOPROST 0.005 % OP SOLN
1.0000 [drp] | Freq: Every day | OPHTHALMIC | Status: DC
  Administered 2018-06-19 – 2018-06-22 (×4): 1 [drp] via OPHTHALMIC
  Filled 2018-06-19: qty 2.5

## 2018-06-19 MED ORDER — TRAVOPROST 0.004 % OP SOLN: 1.0000 [drp] | Freq: Every day | OPHTHALMIC | Status: DC

## 2018-06-19 MED ORDER — HYDROCODONE-ACETAMINOPHEN 5-325 MG PO TABS
1.0000 | ORAL_TABLET | ORAL | Status: DC | PRN
  Administered 2018-06-19 – 2018-06-23 (×18): 1 via ORAL
  Filled 2018-06-19 (×20): qty 1

## 2018-06-19 MED ORDER — ONDANSETRON 4 MG PO TBDP
4.0000 mg | ORAL_TABLET | Freq: Three times a day (TID) | ORAL | Status: DC | PRN
  Administered 2018-06-19 – 2018-06-22 (×3): 4 mg via ORAL
  Filled 2018-06-19 (×3): qty 1

## 2018-06-19 MED ORDER — IOPAMIDOL (ISOVUE-300) INJECTION 61%
INTRAVENOUS | Status: AC
  Filled 2018-06-19: qty 100

## 2018-06-19 MED ORDER — SODIUM CHLORIDE (PF) 0.9 % IJ SOLN
INTRAMUSCULAR | Status: AC
  Filled 2018-06-19: qty 50

## 2018-06-19 MED ORDER — FENTANYL CITRATE (PF) 100 MCG/2ML IJ SOLN
25.0000 ug | INTRAMUSCULAR | Status: DC | PRN
  Administered 2018-06-19 – 2018-06-21 (×13): 50 ug via INTRAVENOUS
  Filled 2018-06-19 (×13): qty 2

## 2018-06-19 MED ORDER — FENTANYL CITRATE (PF) 100 MCG/2ML IJ SOLN
50.0000 ug | Freq: Once | INTRAMUSCULAR | Status: AC
  Administered 2018-06-19: 50 ug via INTRAVENOUS
  Filled 2018-06-19: qty 2

## 2018-06-19 MED ORDER — IOPAMIDOL (ISOVUE-300) INJECTION 61%
100.0000 mL | Freq: Once | INTRAVENOUS | Status: AC | PRN
  Administered 2018-06-19: 100 mL via INTRAVENOUS

## 2018-06-19 MED ORDER — SODIUM CHLORIDE 0.9% FLUSH
3.0000 mL | Freq: Two times a day (BID) | INTRAVENOUS | Status: DC
  Administered 2018-06-20 – 2018-06-23 (×4): 3 mL via INTRAVENOUS

## 2018-06-19 MED ORDER — SODIUM CHLORIDE 0.9 % IV BOLUS
1000.0000 mL | Freq: Once | INTRAVENOUS | Status: AC
  Administered 2018-06-19: 1000 mL via INTRAVENOUS

## 2018-06-19 MED ORDER — INSULIN ASPART 100 UNIT/ML ~~LOC~~ SOLN
0.0000 [IU] | SUBCUTANEOUS | Status: DC
  Administered 2018-06-20 (×3): 1 [IU] via SUBCUTANEOUS
  Administered 2018-06-20 – 2018-06-21 (×3): 2 [IU] via SUBCUTANEOUS
  Administered 2018-06-21 (×2): 1 [IU] via SUBCUTANEOUS
  Administered 2018-06-21 – 2018-06-22 (×3): 2 [IU] via SUBCUTANEOUS
  Administered 2018-06-22: 1 [IU] via SUBCUTANEOUS
  Administered 2018-06-22: 2 [IU] via SUBCUTANEOUS
  Administered 2018-06-22: 1 [IU] via SUBCUTANEOUS
  Administered 2018-06-22: 3 [IU] via SUBCUTANEOUS
  Administered 2018-06-23 (×3): 1 [IU] via SUBCUTANEOUS

## 2018-06-19 NOTE — ED Provider Notes (Signed)
[facility] DEPT Provider Note   CSN: [id number] Arrival date & time: 06/19/18  4098     History   Chief Complaint Chief Complaint  Patient presents with  . Pancreatitis    HPI LETRICIA [name] is a 62 y.o. female.  The history is provided by the patient.  Abdominal Pain   This is a recurrent problem. The current episode started more than 2 days ago. The problem occurs daily. The problem has been gradually worsening. The pain is associated with eating. The pain is located in the epigastric region, RUQ and LUQ. The quality of the pain is aching and dull. The pain is at a severity of 3/10. The pain is mild. Associated symptoms include anorexia and nausea. Pertinent negatives include fever, diarrhea, flatus, hematochezia, melena, vomiting, constipation, dysuria, hematuria, headaches, arthralgias and myalgias. The symptoms are aggravated by eating. Relieved by: narcotics. Past workup includes CT scan. Past workup does not include GI consult. Past medical history comments: recent diagnosis of pancreatitis .    Past Medical History:  Diagnosis Date  . Anemia 2006   required transfusion post TAH/BSO 05/2005  . Arthritis   . Asthma   . Chronic cough   . Colon polyps    hyperplastic  . Diabetes mellitus without complication ([place])   . Diverticulosis   . GERD (gastroesophageal reflux disease)    on prilosec  . GLA deficiency ([place])   . Glaucoma of both eyes   . Hypertension   . Joint pain   . Osteoarthritis   . Pancreatitis   . Shortness of breath dyspnea     Patient Active Problem List   Diagnosis Date Noted  . Other fatigue 08/02/2017  . Shortness of breath on exertion 08/02/2017  . Abdominal pain, epigastric   . Abnormal CT of the abdomen   . Pancreatitis, acute   . Acute pancreatitis 08/13/2015  . UTI (lower urinary tract infection) 08/13/2015  . Severe obesity (BMI >= 40) ([place]) 01/23/2015  . Sinusitis, chronic 01/08/2015  . Cough variant  asthma 01/03/2015  . Arthritis of right hip 12/12/2013  . 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 Benign neoplasm of colon 08/22/2013  . Special screening for malignant neoplasms, colon 08/22/2013  . Abdominal pain 08/19/2013  . Diabetes mellitus ([place]) 08/19/2013  . Essential hypertension, benign 08/19/2013  . Ventral hernia 08/19/2013    Past Surgical History:  Procedure Laterality Date  . ABDOMINAL HYSTERECTOMY    . BREAST EXCISIONAL BIOPSY Right 1990  . COLONOSCOPY N/A 08/22/2013   Procedure: COLONOSCOPY;  Surgeon: Irene Shipper, MD;  Location: [facility] ENDOSCOPY;  Service: Endoscopy;  Laterality: N/A;  . ESOPHAGOGASTRODUODENOSCOPY N/A 08/22/2013   Procedure: ESOPHAGOGASTRODUODENOSCOPY (EGD);  Surgeon: Irene Shipper, MD;  Location: [facility] ENDOSCOPY;  Service: Endoscopy;  Laterality: N/A;  . ESOPHAGOGASTRODUODENOSCOPY (EGD) WITH PROPOFOL N/A 08/19/2015   Procedure: ESOPHAGOGASTRODUODENOSCOPY (EGD) WITH PROPOFOL;  Surgeon: Irene Shipper, MD;  Location: [facility] ENDOSCOPY;  Service: Endoscopy;  Laterality: N/A;  . HERNIA REPAIR    . HYSTEROSCOPY W/D&C  01/2005   for uterine fibroids.   . INSERTION OF MESH N/A 08/24/2013   Procedure: INSERTION OF MESH;  Surgeon: Edward Jolly, MD;  Location: [facility];  Service: General;  Laterality: N/A;  . JOINT REPLACEMENT Right   . LIPOMA EXCISION Left 06/21/2015   Procedure: EXCISION OF LEFT SCALP LIPOMA;  Surgeon: Johnathan Hausen, MD;  Location: [facility];  Service: General;  Laterality: Left;  . PANNICULECTOMY  N/A 08/24/2013   Procedure: PANNICULECTOMY;  Surgeon: Benjamin T Hoxworth, MD;  Location: [facility] OR;  Service: General;  Laterality: N/A;  . TOTAL ABDOMINAL HYSTERECTOMY W/ BILATERAL SALPINGOOPHORECTOMY  05/2005  . TOTAL HIP ARTHROPLASTY Right 12/12/2013   Procedure: RIGHT TOTAL HIP ARTHROPLASTY ANTERIOR APPROACH;  Surgeon: Christopher Y Blackman, MD;  Location: [facility] OR;  Service: Orthopedics;  Laterality: Right;  . VENTRAL HERNIA REPAIR N/A  08/24/2013   Procedure: HERNIA REPAIR VENTRAL ADULT;  Surgeon: Benjamin T Hoxworth, MD;  Location: [facility] OR;  Service: General;  Laterality: N/A;     OB History    Gravida  5   Para      Term      Preterm      AB      Living  5     SAB      TAB      Ectopic      Multiple      Live Births               Home Medications    Prior to Admission medications   Medication Sig Start Date End Date Taking? Authorizing Provider  amLODipine (NORVASC) 5 MG tablet Take 1 tablet (5 mg total) by mouth daily. 08/21/15  Yes Tat, David, MD  blood glucose meter kit and supplies KIT Dispense based on patient and insurance preference. Use up to four times daily as directed. (FOR ICD-9 250.00, 250.01). 08/02/17  Yes Beasley, Caren D, MD  budesonide-formoterol (SYMBICORT) 80-4.5 MCG/ACT inhaler Take 2 puffs first thing in am and then another 2 puffs about 12 hours later. Patient taking differently: Inhale 2 puffs into the lungs 2 (two) times daily as needed (wheezing or shortness of breath).  01/03/15  Yes Wert, Michael B, MD  empagliflozin (JARDIANCE) 25 MG TABS tablet Take 25 mg by mouth daily.   Yes [provider]  glimepiride (AMARYL) 4 MG tablet Take 4 mg by mouth daily with breakfast.   Yes [provider]  glucose blood test strip Use as instructed 08/25/17  Yes Beasley, Caren D, MD  HYDROcodone-acetaminophen (NORCO/VICODIN) 5-325 MG tablet Take 1 tablet by mouth every 4 (four) hours as needed. Patient taking differently: Take 1 tablet by mouth every 4 (four) hours as needed for moderate pain.  06/17/18  Yes Haviland, Julie, MD  losartan (COZAAR) 100 MG tablet Take 1 tablet (100 mg total) by mouth daily. 08/21/15  Yes Tat, David, MD  MELATONIN PO Take 1 tablet by mouth at bedtime as needed (sleep).    Yes [provider]  metFORMIN (GLUCOPHAGE) 1000 MG tablet Take 1,000 mg by mouth 2 (two) times daily with a meal.   Yes [provider]  omeprazole (PRILOSEC) 40  MG capsule Take 40 mg by mouth daily.  07/13/15  Yes [provider]  ondansetron (ZOFRAN ODT) 4 MG disintegrating tablet Take 1 tablet (4 mg total) by mouth every 8 (eight) hours as needed. Patient taking differently: Take 4 mg by mouth every 8 (eight) hours as needed for nausea or vomiting.  06/17/18  Yes Haviland, Julie, MD  ONE TOUCH LANCETS MISC 1 Package by Does not apply route every morning. 08/02/17  Yes Beasley, Caren D, MD  travoprost, benzalkonium, (TRAVATAN) 0.004 % ophthalmic solution Place 1 drop into both eyes at bedtime.   Yes [provider]  buPROPion (WELLBUTRIN SR) 150 MG 12 hr tablet Take 1 tablet (150 mg total) by mouth daily. Patient not taking: Reported on 06/19/2018 12/06/17     Lacy Duverney M, PA-C  Vitamin D, Ergocalciferol, (DRISDOL) 50000 units CAPS capsule Take 1 capsule (50,000 Units total) by mouth every 7 (seven) days. Patient not taking: Reported on 06/19/2018 01/06/18   Waldon Merl, PA-C    Family History Family History  Problem Relation Age of Onset  . Emphysema Mother        smoked  . Diabetes Mother   . Hypertension Mother   . Colon cancer Father        7-s  . Hypertension Father   . Heart disease Father   . Breast cancer Sister     Social History Social History   Tobacco Use  . Smoking status: Former Smoker    Packs/day: 0.25    Years: 15.00    Pack years: 3.75    Types: Cigarettes    Last attempt to quit: 06/22/1978    Years since quitting: 40.0  . Smokeless tobacco: Never Used  Substance Use Topics  . Alcohol use: No    Alcohol/week: 0.0 standard drinks  . Drug use: No     Allergies   Diclofenac and Penicillins   Review of Systems Review of Systems  Constitutional: Negative for chills and fever.  HENT: Negative for ear pain and sore throat.   Eyes: Negative for pain and visual disturbance.  Respiratory: Negative for cough and shortness of breath.   Cardiovascular: Negative for chest pain and palpitations.    Gastrointestinal: Positive for abdominal pain, anorexia and nausea. Negative for constipation, diarrhea, flatus, hematochezia, melena and vomiting.  Genitourinary: Negative for dysuria and hematuria.  Musculoskeletal: Negative for arthralgias, back pain and myalgias.  Skin: Negative for color change and rash.  Neurological: Negative for seizures, syncope and headaches.  All other systems reviewed and are negative.    Physical Exam Updated Vital Signs  ED Triage Vitals  Enc Vitals Group     BP 06/19/18 0631 127/75     Pulse Rate 06/19/18 0631 (!) 59     Resp 06/19/18 0631 16     Temp --      Temp src --      SpO2 06/19/18 0631 98 %     Weight 06/19/18 0631 200 lb (90.7 kg)     Height 06/19/18 0631 5' 2" (1.575 m)     Head Circumference --      Peak Flow --      Pain Score 06/19/18 0634 7     Pain Loc --      Pain Edu? --      Excl. in [place]? --     Physical Exam Vitals signs and nursing note reviewed.  Constitutional:      General: She is not in acute distress.    Appearance: She is well-developed.  HENT:     Head: Normocephalic and atraumatic.     Nose: Nose normal.     Mouth/Throat:     Mouth: Mucous membranes are moist.  Eyes:     Extraocular Movements: Extraocular movements intact.     Conjunctiva/sclera: Conjunctivae normal.     Pupils: Pupils are equal, round, and reactive to light.  Neck:     Musculoskeletal: Normal range of motion and neck supple.  Cardiovascular:     Rate and Rhythm: Normal rate and regular rhythm.     Pulses: Normal pulses.     Heart sounds: Normal heart sounds. No murmur.  Pulmonary:     Effort: Pulmonary effort is normal. No respiratory distress.  Breath sounds: Normal breath sounds.  Abdominal:     General: There is no distension.     Palpations: Abdomen is soft. There is no mass.     Tenderness: There is abdominal tenderness. There is no guarding or rebound.     Hernia: No hernia is present.  Musculoskeletal: Normal range of  motion.  Skin:    General: Skin is warm and dry.     Capillary Refill: Capillary refill takes less than 2 seconds.  Neurological:     General: No focal deficit present.     Mental Status: She is alert.  Psychiatric:        Mood and Affect: Mood normal.      ED Treatments / Results  Labs (all labs ordered are listed, but only abnormal results are displayed) Labs Reviewed  COMPREHENSIVE METABOLIC PANEL - Abnormal; Notable for the following components:      Result Value   Glucose, Bld 128 (*)    AST 13 (*)    All other components within normal limits  URINALYSIS, ROUTINE W REFLEX MICROSCOPIC - Abnormal; Notable for the following components:   Specific Gravity, Urine 1.033 (*)    Glucose, UA >=500 (*)    Ketones, ur 20 (*)    Bacteria, UA RARE (*)    All other components within normal limits  I-STAT CG4 LACTIC ACID, ED - Abnormal; Notable for the following components:   Lactic Acid, Venous 0.49 (*)    All other components within normal limits  LIPASE, BLOOD  CBC WITH DIFFERENTIAL/PLATELET    EKG None  Radiology Ct Abdomen Pelvis W Contrast  Result Date: 06/19/2018 CLINICAL DATA:  Patient presents after being evaluated two days for continuing RUQ abd pain caused by pancreatitis. Patient states her pain medications are not working long enough and neither is her nausea medication. Pain medications are not working well enough. Nausea without vomiting. EXAM: CT ABDOMEN AND PELVIS WITH CONTRAST TECHNIQUE: Multidetector CT imaging of the abdomen and pelvis was performed using the standard protocol following bolus administration of intravenous contrast. CONTRAST:  100mL ISOVUE-300 IOPAMIDOL (ISOVUE-300) INJECTION 61% COMPARISON:  06/17/2018 FINDINGS: Lower chest: The lung bases are clear. Heart size is normal. Hepatobiliary: No focal liver abnormality is seen. No radiopaque gallstones, biliary dilatation, or pericholecystic inflammatory changes. Pancreas: There is mild peripancreatic  stranding primarily in the region of the pancreatic head. There is increased stranding in the fat adjacent to the celiac axis and extending into the base of the mesenteric root. Spleen: Normal in size without focal abnormality. Adrenals/Urinary Tract: Adrenal glands are normal in appearance. There is symmetric enhancement and excretion from the kidneys. The ureters are unremarkable. Streak artifact from RIGHT hip hardware partially obscures detail in the region of the bladder but no abnormalities are identified in this region. Stomach/Bowel: Stomach and small bowel loops are normal in appearance. Large stool burden. The appendix is well seen and has a normal appearance. Vascular/Lymphatic: There is atherosclerotic calcification not associated of the abdominal aorta with aneurysm. Small likely reactive lymph nodes are identified at the root of the mesentery and the para-aortic region in the UPPER abdomen. Lymph node measures 9 millimeters in the gastrohepatic ligament on image 21/2. Portacaval lymph node is 1.5 centimeters on image 25/2. Reproductive: Hysterectomy. No adnexal mass. Artifact in the pelvis. Other: No free pelvic fluid. Postoperative changes in the anterior abdominal wall. No hernia. No free pelvic fluid. Musculoskeletal: RIGHT hip arthroplasty. Mild degenerative changes in the LOWER thoracic and lumbar spine. No   evidence for acute abnormality. IMPRESSION: 1. Increased peripancreatic stranding consistent with acute pancreatitis. 2. No evidence for pseudocyst or abscess. 3. Likely reactive retroperitoneal lymph nodes. 4. Large stool burden. 5. Hysterectomy. Aortic Atherosclerosis (ICD10-I70.0). Electronically Signed   By: Elizabeth  Brown M.D.   On: 06/19/2018 09:48   Ct Abdomen Pelvis W Contrast  Result Date: 06/17/2018 CLINICAL DATA:  Generalized abdominal pain for 5 days. EXAM: CT ABDOMEN AND PELVIS WITH CONTRAST TECHNIQUE: Multidetector CT imaging of the abdomen and pelvis was performed using  the standard protocol following bolus administration of intravenous contrast. CONTRAST:  100mL OMNIPAQUE IOHEXOL 300 MG/ML  SOLN COMPARISON:  08/13/2015 FINDINGS: Lower chest: No acute abnormality. Hepatobiliary: The dome of the right hepatic lobe is incompletely included this study. The liver is otherwise homogeneous in appearance without space-occupying mass. The gallbladder is normal. Pancreas: Trace edema about the pancreatic head, uncinate process proximal body. Ductal dilatation or mass. Spleen: Normal Adrenals/Urinary Tract: Adrenal glands are unremarkable. Kidneys are normal, without renal calculi, focal lesion, or hydronephrosis. Bladder is unremarkable. Stomach/Bowel: Stomach is within normal limits. Appendix appears normal. No evidence of bowel wall thickening, distention, or inflammatory changes. Moderate stool retention within the colon suspicious for constipation. Vascular/Lymphatic: Aortic atherosclerosis. No enlarged abdominal or pelvic lymph nodes. Reproductive: Status post hysterectomy. No adnexal masses. Other: Ventral mesh repair without recurrence of hernia noted. Midline ventral scarring overlying lower abdomen and pelvis. No free air free fluid. Musculoskeletal: No acute or significant osseous findings. IMPRESSION: 1. Slight peripancreatic edema suspicious for pancreatitis without complicating features. 2. Moderate stool retention noted within the colon. 3. Status post ventral mesh repair without recurrence of hernia noted. Right hip arthroplasty obscures portions of the right hemipelvis. Electronically Signed   By: David  Kwon M.D.   On: 06/17/2018 23:30    Procedures Procedures (including critical care time)  Medications Ordered in ED Medications  iopamidol (ISOVUE-300) 61 % injection (has no administration in time range)  sodium chloride (PF) 0.9 % injection (has no administration in time range)  sodium chloride 0.9 % bolus 1,000 mL (0 mLs Intravenous Stopped 06/19/18 0854)    fentaNYL (SUBLIMAZE) injection 50 mcg (50 mcg Intravenous Given 06/19/18 0749)  ondansetron (ZOFRAN) injection 4 mg (4 mg Intravenous Given 06/19/18 0747)  iopamidol (ISOVUE-300) 61 % injection 100 mL (100 mLs Intravenous Contrast Given 06/19/18 0859)  0.9 %  sodium chloride infusion (100 mL/hr Intravenous New Bag/Given 06/19/18 1019)     Initial Impression / Assessment and Plan / ED Course  I have reviewed the triage vital signs and the nursing notes.  Pertinent labs & imaging results that were available during my care of the patient were reviewed by me and considered in my medical decision making (see chart for details).     [name] is a 62-year-old female with history of diabetes, reflux who presents to the ED with abdominal pain.  Patient with normal vitals.  No fever.  Patient diagnosed with pancreatitis 2 days ago but was discharged from the ED as she is able to tolerate pain, eating.  However she states that she has been home she has gotten progressively worse.  She has worsening pain when she eats.  She has been afraid to eat due to the pain.  She has had home narcotic pain medications without much relief.  She has some abdominal tenderness diffusely on exam but no signs of peritonitis.  Concern for worsened pancreatitis, other intra-abdominal process such as gastritis, appendicitis.  Lab work obtained including CT   of abdomen and pelvis.  Patient given IV fluid bolus, IV fentanyl, IV Zofran.  Patient with CT scan that shows interval worsening of pancreatitis.  No abscess.  Lipase within normal limits.  No significant white count.  No significant anemia, electrolyte abnormality, kidney injury.  Urinalysis shows no signs of infection.  However there is some ketones likely suggestive of dehydration.  Patient still not improved after IV fluids and IV pain medicine and given worsening of pancreatitis on CT scan will admit for further IV hydration and pain control.  Gallbladder and liver  enzymes within normal limits and doubt gallbladder etiology.  Patient is not an alcohol abuser as well.  Unclear of true etiology of pancreatitis but will admit for further care.  Hemodynamically stable throughout my care.  This chart was dictated using voice recognition software.  Despite best efforts to proofread,  errors can occur which can change the documentation meaning.   Final Clinical Impressions(s) / ED Diagnoses   Final diagnoses:  Acute pancreatitis, unspecified complication status, unspecified pancreatitis type    ED Discharge Orders    None       Lennice Sites, DO 06/19/18 1046

## 2018-06-19 NOTE — H&P (Signed)
History and Physical   [name]:[id number] DOB: [birth year] DOA: 06/19/2018  Referring MD/NP/PA: Dr. Ronnald Nian, [place] PCP: Antony Contras, MD Outpatient Specialists: Dr. Henrene Pastor, GI  Patient coming from: Home  Chief Complaint: Abdominal pain, nausea, anorexia  HPI: [name] is a 62 y.o. female with a history of pancreatitis, T2DM, HTN, and GERD who returned to the ED for intractable nausea, vomiting, and epigastric abdominal pain. She had been evaluated 12/27 and sent home since she was able to take po with Dx acute pancreatitis but has been unable to take anything by mouth since that time and pain has worsened. It is described as across the upper abdomen, severe, constant, worse when trying to eat anything, radiating to the back, partially improved with norco and associated with severe nausea.    ED Course: Appeared dehydrated but vitals and labs stable. Urinalysis demonstrating increased ketones consistent with no po intake. CT showed increasing pancreatic inflammation without fluid collection and some constipation. IV fluids and analgesics provided and admission for failed outpatient management of pancreatitis was requested.   Review of Systems: No fever, chills, cough, shortness of breath, chest pain, palpitations, diarrhea, bleeding, rashes, myalgias, arthralgias. No new medications or sick contacts. And per HPI. All others reviewed and are negative.   Past Medical History:  Diagnosis Date  . Anemia 2006   required transfusion post TAH/BSO 05/2005  . Arthritis   . Asthma   . Chronic cough   . Colon polyps    hyperplastic  . Diabetes mellitus without complication ([place])   . Diverticulosis   . GERD (gastroesophageal reflux disease)    on prilosec  . GLA deficiency ([place])   . Glaucoma of both eyes   . Hypertension   . Joint pain   . Osteoarthritis   . Pancreatitis   . Shortness of breath dyspnea    Past Surgical History:  Procedure Laterality Date  . ABDOMINAL  HYSTERECTOMY    . BREAST EXCISIONAL BIOPSY Right 1990  . COLONOSCOPY N/A 08/22/2013   Procedure: COLONOSCOPY;  Surgeon: Irene Shipper, MD;  Location: [facility] ENDOSCOPY;  Service: Endoscopy;  Laterality: N/A;  . ESOPHAGOGASTRODUODENOSCOPY N/A 08/22/2013   Procedure: ESOPHAGOGASTRODUODENOSCOPY (EGD);  Surgeon: Irene Shipper, MD;  Location[facility] ENDOSCOPY;  Service: Endoscopy;  Laterality: N/A;  . ESOPHAGOGASTRODUODENOSCOPY (EGD) WITH PROPOFOL N/A 08/19/2015   Procedure: ESOPHAGOGASTRODUODENOSCOPY (EGD) WITH PROPOFOL;  Surgeon: Irene Shipper, MD;  Location: [facility] ENDOSCOPY;  Service: Endoscopy;  Laterality: N/A;  . HERNIA REPAIR    . HYSTEROSCOPY W/D&C  01/2005   for uterine fibroids.   . INSERTION OF MESH N/A 08/24/2013   Procedure: INSERTION OF MESH;  Surgeon: Edward Jolly, MD;  Location: [place];  Service: General;  Laterality: N/A;  . JOINT REPLACEMENT Right   . LIPOMA EXCISION Left 06/21/2015   Procedure: EXCISION OF LEFT SCALP LIPOMA;  Surgeon: Johnathan Hausen, MD;  Location: [place];  Service: General;  Laterality: Left;  . PANNICULECTOMY N/A 08/24/2013   Procedure: PANNICULECTOMY;  Surgeon: Edward Jolly, MD;  Location: [place];  Service: General;  Laterality: N/A;  . TOTAL ABDOMINAL HYSTERECTOMY W/ BILATERAL SALPINGOOPHORECTOMY  05/2005  . TOTAL HIP ARTHROPLASTY Right 12/12/2013   Procedure: RIGHT TOTAL HIP ARTHROPLASTY ANTERIOR APPROACH;  Surgeon: Mcarthur Rossetti, MD;  Location: [place];  Service: Orthopedics;  Laterality: Right;  . VENTRAL HERNIA REPAIR N/A 08/24/2013   Procedure: HERNIA REPAIR VENTRAL ADULT;  Surgeon: Edward Jolly, MD;  Location: [place];  Service:  General;  Laterality: N/A;   - Remote smoker and drank alcohol minimally decades ago. Married.   reports that she quit smoking about 40 years ago. Her smoking use included cigarettes. She has a 3.75 pack-year smoking history. She has never used smokeless tobacco. She reports that she does not drink alcohol or  use drugs. Allergies  Allergen Reactions  . Diclofenac     Hives   . Penicillins Hives    Has patient had a PCN reaction causing immediate rash, facial/tongue/throat swelling, SOB or lightheadedness with hypotension: Yes Has patient had a PCN reaction causing severe rash involving mucus membranes or skin necrosis: Yes Has patient had a PCN reaction that required hospitalization No Has patient had a PCN reaction occurring within the last 10 years: No. If all of the above answers are "NO", then may proceed with Cephalosporin use.    Family History  Problem Relation Age of Onset  . Emphysema Mother        smoked  . Diabetes Mother   . Hypertension Mother   . Colon cancer Father        7-s  . Hypertension Father   . Heart disease Father   . Breast cancer Sister    - Family history otherwise reviewed and not pertinent.  Prior to Admission medications   Medication Sig Start Date End Date Taking? Authorizing Provider  amLODipine (NORVASC) 5 MG tablet Take 1 tablet (5 mg total) by mouth daily. 08/21/15  Yes TatShanon Brow, MD  blood glucose meter kit and supplies KIT Dispense based on patient and insurance preference. Use up to four times daily as directed. (FOR ICD-9 250.00, 250.01). 08/02/17  Yes Leafy Ro, Caren D, MD  budesonide-formoterol [facility]) 80-4.5 MCG/ACT inhaler Take 2 puffs first thing in am and then another 2 puffs about 12 hours later. Patient taking differently: Inhale 2 puffs into the lungs 2 (two) times daily as needed (wheezing or shortness of breath).  01/03/15  Yes Tanda Rockers, MD  empagliflozin (JARDIANCE) 25 MG TABS tablet Take 25 mg by mouth daily.   Yes [provider]  glimepiride (AMARYL) 4 MG tablet Take 4 mg by mouth daily with breakfast.   Yes [provider]  glucose blood test strip Use as instructed 08/25/17  Yes Dennard Nip D, MD  HYDROcodone-acetaminophen (NORCO/VICODIN) 5-325 MG tablet Take 1 tablet by mouth every 4 (four) hours as  needed. Patient taking differently: Take 1 tablet by mouth every 4 (four) hours as needed for moderate pain.  06/17/18  Yes Isla Pence, MD  losartan (COZAAR) 100 MG tablet Take 1 tablet (100 mg total) by mouth daily. 08/21/15  Yes Tat, Shanon Brow, MD  MELATONIN PO Take 1 tablet by mouth at bedtime as needed (sleep).    Yes [provider]  metFORMIN (GLUCOPHAGE) 1000 MG tablet Take 1,000 mg by mouth 2 (two) times daily with a meal.   Yes [provider]  omeprazole (PRILOSEC) 40 MG capsule Take 40 mg by mouth daily.  07/13/15  Yes [provider]  ondansetron (ZOFRAN ODT) 4 MG disintegrating tablet Take 1 tablet (4 mg total) by mouth every 8 (eight) hours as needed. Patient taking differently: Take 4 mg by mouth every 8 (eight) hours as needed for nausea or vomiting.  06/17/18  Yes Isla Pence, MD  ONE TOUCH LANCETS MISC 1 Package by Does not apply route every morning. 08/02/17  Yes Beasley, Caren D, MD  travoprost, benzalkonium, (TRAVATAN) 0.004 % ophthalmic solution Place 1 drop  into both eyes at bedtime.   Yes [provider]  buPROPion (WELLBUTRIN SR) 150 MG 12 hr tablet Take 1 tablet (150 mg total) by mouth daily. Patient not taking: Reported on 06/19/2018 12/06/17   Waldon Merl, PA-C  Vitamin D, Ergocalciferol, (DRISDOL) 50000 units CAPS capsule Take 1 capsule (50,000 Units total) by mouth every 7 (seven) days. Patient not taking: Reported on 06/19/2018 01/06/18   Waldon Merl, PA-C    Physical Exam: Vitals:   06/19/18 0920 06/19/18 1015 06/19/18 1100 06/19/18 1219  BP: (!) 140/92 (!) 150/86 130/71 (!) 131/99  Pulse: 86 (!) 58 61 60  Resp:  16  17  Temp:    98.9 F (37.2 C)  TempSrc:    Oral  SpO2: (!) 87% 99% 97% 99%  Weight:      Height:       Constitutional: 62 y.o. female in no distress, calm demeanor Eyes: Lids and conjunctivae normal, PERRL ENMT: Mucous membranes are dry. Posterior pharynx clear of any exudate or lesions. Fair  dentition.  Neck: normal, supple, no masses, no thyromegaly Respiratory: Non-labored breathing without accessory muscle use. Clear breath sounds to auscultation bilaterally Cardiovascular: Regular rate and rhythm, no murmurs, rubs, or gallops. No carotid bruits. No JVD. No significant LE edema. + pedal pulses. Abdomen: Hypoactive bowel sounds, diffuse tenderness worst in epigastrium with guarding without distention or rebound. No masses palpated. No hepatosplenomegaly. GU: No indwelling catheter Musculoskeletal: No clubbing / cyanosis. No joint deformity upper and lower extremities. Good ROM, no contractures. Normal muscle tone.  Skin: Warm, dry. No rashes, wounds, or ulcers. No significant lesions noted.  Neurologic: CN II-XII grossly intact. Gait not assessed. Speech normal. No focal deficits in motor strength or sensation in all extremities.  Psychiatric: Alert and oriented x3. Normal judgment and insight. Mood euthymic with congruent affect.   Labs on Admission: I have personally reviewed following labs and imaging studies  CBC: Recent Labs  Lab 06/17/18 1719 06/19/18 0740  WBC 7.5 7.9  NEUTROABS  --  5.5  HGB 13.6 13.6  HCT 43.6 42.9  MCV 89.5 91.1  PLT 341 970   Basic Metabolic Panel: Recent Labs  Lab 06/17/18 1719 06/19/18 0740  NA 142 139  K 3.7 3.5  CL 105 104  CO2 31 26  GLUCOSE 104* 128*  BUN 14 14  CREATININE 0.86 0.82  CALCIUM 9.6 9.2   GFR: Estimated Creatinine Clearance: 75.4 mL/min (by C-G formula based on SCr of 0.82 mg/dL). Liver Function Tests: Recent Labs  Lab 06/17/18 1719 06/19/18 0740  AST 13* 13*  ALT 13 14  ALKPHOS 72 66  BILITOT 0.5 0.6  PROT 7.0 7.5  ALBUMIN 3.8 4.0   Recent Labs  Lab 06/17/18 1719 06/19/18 0740  LIPASE 57* 48   No results for input(s): AMMONIA in the last 168 hours. Coagulation Profile: No results for input(s): INR, PROTIME in the last 168 hours. Cardiac Enzymes: No results for input(s): CKTOTAL, CKMB,  CKMBINDEX, TROPONINI in the last 168 hours. BNP (last 3 results) No results for input(s): PROBNP in the last 8760 hours. HbA1C: No results for input(s): HGBA1C in the last 72 hours. CBG: No results for input(s): GLUCAP in the last 168 hours. Lipid Profile: No results for input(s): CHOL, HDL, LDLCALC, TRIG, CHOLHDL, LDLDIRECT in the last 72 hours. Thyroid Function Tests: No results for input(s): TSH, T4TOTAL, FREET4, T3FREE, THYROIDAB in the last 72 hours. Anemia Panel: No results for input(s): VITAMINB12, FOLATE, FERRITIN, TIBC, IRON,  RETICCTPCT in the last 72 hours. Urine analysis:    Component Value Date/Time   COLORURINE YELLOW 06/19/2018 [place] 06/19/2018 0741   LABSPEC 1.033 (H) 06/19/2018 0741   PHURINE 5.0 06/19/2018 0741   GLUCOSEU >=500 (A) 06/19/2018 0741   HGBUR NEGATIVE 06/19/2018 0741   BILIRUBINUR NEGATIVE 06/19/2018 0741   KETONESUR 20 (A) 06/19/2018 0741   PROTEINUR NEGATIVE 06/19/2018 0741   UROBILINOGEN 1.0 11/12/2014 1944   NITRITE NEGATIVE 06/19/2018 0741   LEUKOCYTESUR NEGATIVE 06/19/2018 0741    No results found for this or any previous visit (from the past 240 hour(s)).   Radiological Exams on Admission: Ct Abdomen Pelvis W Contrast  Result Date: 06/19/2018 CLINICAL DATA:  Patient presents after being evaluated two days for continuing RUQ abd pain caused by pancreatitis. Patient states her pain medications are not working long enough and neither is her nausea medication. Pain medications are not working well enough. Nausea without vomiting. EXAM: CT ABDOMEN AND PELVIS WITH CONTRAST TECHNIQUE: Multidetector CT imaging of the abdomen and pelvis was performed using the standard protocol following bolus administration of intravenous contrast. CONTRAST:  142m ISOVUE-300 IOPAMIDOL (ISOVUE-300) INJECTION 61% COMPARISON:  06/17/2018 FINDINGS: Lower chest: The lung bases are clear. Heart size is normal. Hepatobiliary: No focal liver abnormality is  seen. No radiopaque gallstones, biliary dilatation, or pericholecystic inflammatory changes. Pancreas: There is mild peripancreatic stranding primarily in the region of the pancreatic head. There is increased stranding in the fat adjacent to the celiac axis and extending into the base of the mesenteric root. Spleen: Normal in size without focal abnormality. Adrenals/Urinary Tract: Adrenal glands are normal in appearance. There is symmetric enhancement and excretion from the kidneys. The ureters are unremarkable. Streak artifact from RIGHT hip hardware partially obscures detail in the region of the bladder but no abnormalities are identified in this region. Stomach/Bowel: Stomach and small bowel loops are normal in appearance. Large stool burden. The appendix is well seen and has a normal appearance. Vascular/Lymphatic: There is atherosclerotic calcification not associated of the abdominal aorta with aneurysm. Small likely reactive lymph nodes are identified at the root of the mesentery and the para-aortic region in the UPPER abdomen. Lymph node measures 9 millimeters in the gastrohepatic ligament on image 21/2. Portacaval lymph node is 1.5 centimeters on image 25/2. Reproductive: Hysterectomy. No adnexal mass. Artifact in the pelvis. Other: No free pelvic fluid. Postoperative changes in the anterior abdominal wall. No hernia. No free pelvic fluid. Musculoskeletal: RIGHT hip arthroplasty. Mild degenerative changes in the LOWER thoracic and lumbar spine. No evidence for acute abnormality. IMPRESSION: 1. Increased peripancreatic stranding consistent with acute pancreatitis. 2. No evidence for pseudocyst or abscess. 3. Likely reactive retroperitoneal lymph nodes. 4. Large stool burden. 5. Hysterectomy. Aortic Atherosclerosis (ICD10-I70.0). Electronically Signed   By: ENolon NationsM.D.   On: 06/19/2018 09:48   Ct Abdomen Pelvis W Contrast  Result Date: 06/17/2018 CLINICAL DATA:  Generalized abdominal pain for 5  days. EXAM: CT ABDOMEN AND PELVIS WITH CONTRAST TECHNIQUE: Multidetector CT imaging of the abdomen and pelvis was performed using the standard protocol following bolus administration of intravenous contrast. CONTRAST:  1050mOMNIPAQUE IOHEXOL 300 MG/ML  SOLN COMPARISON:  08/13/2015 FINDINGS: Lower chest: No acute abnormality. Hepatobiliary: The dome of the right hepatic lobe is incompletely included this study. The liver is otherwise homogeneous in appearance without space-occupying mass. The gallbladder is normal. Pancreas: Trace edema about the pancreatic head, uncinate process proximal body. Ductal dilatation or mass. Spleen: Normal Adrenals/Urinary  Tract: Adrenal glands are unremarkable. Kidneys are normal, without renal calculi, focal lesion, or hydronephrosis. Bladder is unremarkable. Stomach/Bowel: Stomach is within normal limits. Appendix appears normal. No evidence of bowel wall thickening, distention, or inflammatory changes. Moderate stool retention within the colon suspicious for constipation. Vascular/Lymphatic: Aortic atherosclerosis. No enlarged abdominal or pelvic lymph nodes. Reproductive: Status post hysterectomy. No adnexal masses. Other: Ventral mesh repair without recurrence of hernia noted. Midline ventral scarring overlying lower abdomen and pelvis. No free air free fluid. Musculoskeletal: No acute or significant osseous findings. IMPRESSION: 1. Slight peripancreatic edema suspicious for pancreatitis without complicating features. 2. Moderate stool retention noted within the colon. 3. Status post ventral mesh repair without recurrence of hernia noted. Right hip arthroplasty obscures portions of the right hemipelvis. Electronically Signed   By: Ashley Royalty M.D.   On: 06/17/2018 23:30    Assessment/Plan Principal Problem:   Acute pancreatitis Active Problems:   Diabetes mellitus without complication (H[facility])   Essential hypertension, benign   Cough variant asthma   Abdominal pain,  epigastric   GERD (gastroesophageal reflux disease)   Constipation   Acute idiopathic pancreatitis: Lipase has come back down, though inflammatory changes continue on CT without current complication. No biliary (normal LFTs) or alcoholic (no intake for decades) etiology suspected. No h/o hypertriglyceridemia (66 at last check earlier this year). Has stopped HCTZ, the previously considered etiology. Failed outpatient management. - NPO, advance to clear liquids once off IV pain medications.  - IVF's - IV analgesics, antiemetics   Constipation: Large stool burden on CT - Senna scheduled. Once taking more po, can add miralax.  - Dulcolax suppository prn, consider enema if needed.   GERD:  - Continue PPI  HTN:  - Continue medications  T2DM:  - Start SSI q4h while NPO. Dextrose in IVF's to prevent ongoing catabolism while NPO. - Hold home oral medications  Asthma:  - No active medications per patient, hasn't taken symbicort in months. Will give prn albuterol  DVT prophylaxis: Lovenox  Code Status: Full  Family Communication: None at bedside Disposition Plan: Home once improved Consults called: None  Admission status: Observation    Patrecia Pour, MD [facility] [email or web address] Password [email or web address] 06/19/2018, 1:43 PM

## 2018-06-19 NOTE — ED Notes (Signed)
Bed: WA05 Expected date:  Expected time:  Means of arrival:  Comments: 

## 2018-06-19 NOTE — Plan of Care (Signed)
Patient received from ED via stretcher. Complains of abdominal pain and requesting pain medication. MD paged for medication order. Will continue to monitor.

## 2018-06-19 NOTE — ED Notes (Signed)
ED TO INPATIENT HANDOFF REPORT  Name/Age/Gender [name] 62 y.o. female  Code Status Code Status History    Date Active Date Inactive Code Status Order ID Comments User Context   08/14/2015 0213 08/21/2015 1414 Full Code [id number]  Reubin Milan, MD Inpatient   12/12/2013 1958 12/15/2013 1307 Full Code [id number]  Mcarthur Rossetti, MD Inpatient   08/24/2013 1703 08/29/2013 1650 Full Code [id number]  Saverio Danker, PA-C Inpatient   08/19/2013 1506 08/24/2013 1703 Full Code [id number]  Domenic Polite, MD Inpatient      Home/SNF/Other Home  Chief Complaint pancreatitis  Level of Care/Admitting Diagnosis ED Disposition    ED Disposition Condition [facility] Area: [facility] [[id number]]  Level of Care: Med-Surg [16]  Diagnosis: Acute pancreatitis [577.0.ICD-9-CM]  Admitting Physician: Patrecia Pour [[id number]]  Attending Physician: Patrecia Pour [phone]  PT Class (Do Not Modify): Observation [104]  PT Acc Code (Do Not Modify): Observation [10022]       Medical History Past Medical History:  Diagnosis Date  . Anemia 2006   required transfusion post TAH/BSO 05/2005  . Arthritis   . Asthma   . Chronic cough   . Colon polyps    hyperplastic  . Diabetes mellitus without complication ([place])   . Diverticulosis   . GERD (gastroesophageal reflux disease)    on prilosec  . GLA deficiency ([place])   . Glaucoma of both eyes   . Hypertension   . Joint pain   . Osteoarthritis   . Pancreatitis   . Shortness of breath dyspnea     Allergies Allergies  Allergen Reactions  . Diclofenac     Hives   . Penicillins Hives    Has patient had a PCN reaction causing immediate rash, facial/tongue/throat swelling, SOB or lightheadedness with hypotension: Yes Has patient had a PCN reaction causing severe rash involving mucus membranes or skin necrosis: Yes Has patient had a PCN reaction that required hospitalization No Has patient had a PCN reaction  occurring within the last 10 years: No. If all of the above answers are "NO", then may proceed with Cephalosporin use.     IV Location/Drains/Wounds Patient Lines/Drains/Airways Status   Active Line/Drains/Airways    Name:   Placement date:   Placement time:   Site:   Days:   Peripheral IV 06/19/18 Right Antecubital   06/19/18    0739    Antecubital   less than 1   Incision (Closed) 06/21/15 Head Left   06/21/15    1447     1094          Labs/Imaging Results for orders placed or performed during the hospital encounter of 06/19/18 (from the past 48 hour(s))  Comprehensive metabolic panel     Status: Abnormal   Collection Time: 06/19/18  7:40 AM  Result Value Ref Range   Sodium 139 135 - 145 mmol/L   Potassium 3.5 3.5 - 5.1 mmol/L   Chloride 104 98 - 111 mmol/L   CO2 26 22 - 32 mmol/L   Glucose, Bld 128 (H) 70 - 99 mg/dL   BUN 14 8 - 23 mg/dL   Creatinine, Ser 0.82 0.44 - 1.00 mg/dL   Calcium 9.2 8.9 - 10.3 mg/dL   Total Protein 7.5 6.5 - 8.1 g/dL   Albumin 4.0 3.5 - 5.0 g/dL   AST 13 (L) 15 - 41 U/L   ALT 14 0 - 44 U/L   Alkaline Phosphatase 66 38 -  126 U/L   Total Bilirubin 0.6 0.3 - 1.2 mg/dL   GFR calc non Af Amer >60 >60 mL/min   GFR calc Af Amer >60 >60 mL/min   Anion gap 9 5 - 15    Comment: Performed at [facility], [place] [address] [place], [place] [address]  Lipase, blood     Status: None   Collection Time: 06/19/18  7:40 AM  Result Value Ref Range   Lipase 48 11 - 51 U/L    Comment: Performed at [facility], [place] [address] [place], [place] [address]  CBC with Diff     Status: None   Collection Time: 06/19/18  7:40 AM  Result Value Ref Range   WBC 7.9 4.0 - 10.5 K/uL   RBC 4.71 3.87 - 5.11 MIL/uL   Hemoglobin 13.6 12.0 - 15.0 g/dL   HCT 42.9 36.0 - 46.0 %   MCV 91.1 80.0 - 100.0 fL   MCH 28.9 26.0 - 34.0 pg   MCHC 31.7 30.0 - 36.0 g/dL   RDW 14.0 11.5 - 15.5 %   Platelets 298 150 - 400 K/uL   nRBC 0.0 0.0 - 0.2 %    Neutrophils Relative % 70 %   Neutro Abs 5.5 1.7 - 7.7 K/uL   Lymphocytes Relative 24 %   Lymphs Abs 1.9 0.7 - 4.0 K/uL   Monocytes Relative 5 %   Monocytes Absolute 0.4 0.1 - 1.0 K/uL   Eosinophils Relative 1 %   Eosinophils Absolute 0.1 0.0 - 0.5 K/uL   Basophils Relative 0 %   Basophils Absolute 0.0 0.0 - 0.1 K/uL   Immature Granulocytes 0 %   Abs Immature Granulocytes 0.02 0.00 - 0.07 K/uL    Comment: Performed at [facility], [place] [address], [place], [place] [address]  Urinalysis, Routine w reflex microscopic     Status: Abnormal   Collection Time: 06/19/18  7:41 AM  Result Value Ref Range   Color, Urine YELLOW YELLOW   APPearance CLEAR CLEAR   Specific Gravity, Urine 1.033 (H) 1.005 - 1.030   pH 5.0 5.0 - 8.0   Glucose, UA >=500 (A) NEGATIVE mg/dL   Hgb urine dipstick NEGATIVE NEGATIVE   Bilirubin Urine NEGATIVE NEGATIVE   Ketones, ur 20 (A) NEGATIVE mg/dL   Protein, ur NEGATIVE NEGATIVE mg/dL   Nitrite NEGATIVE NEGATIVE   Leukocytes, UA NEGATIVE NEGATIVE   RBC / HPF 0-5 0 - 5 RBC/hpf   WBC, UA 0-5 0 - 5 WBC/hpf   Bacteria, UA RARE (A) NONE SEEN   Squamous Epithelial / LPF 0-5 0 - 5   Mucus PRESENT     Comment: Performed at [facility], [place] [address], [place], [place] [address]  I-Stat CG4 Lactic Acid, ED     Status: Abnormal   Collection Time: 06/19/18  7:47 AM  Result Value Ref Range   Lactic Acid, Venous 0.49 (L) 0.5 - 1.9 mmol/L   Ct Abdomen Pelvis W Contrast  Result Date: 06/19/2018 CLINICAL DATA:  Patient presents after being evaluated two days for continuing RUQ abd pain caused by pancreatitis. Patient states her pain medications are not working long enough and neither is her nausea medication. Pain medications are not working well enough. Nausea without vomiting. EXAM: CT ABDOMEN AND PELVIS WITH CONTRAST TECHNIQUE: Multidetector CT imaging of the abdomen and pelvis was performed using the standard protocol following  bolus administration of intravenous contrast. CONTRAST:  156mL ISOVUE-300 IOPAMIDOL (ISOVUE-300)  INJECTION 61% COMPARISON:  06/17/2018 FINDINGS: Lower chest: The lung bases are clear. Heart size is normal. Hepatobiliary: No focal liver abnormality is seen. No radiopaque gallstones, biliary dilatation, or pericholecystic inflammatory changes. Pancreas: There is mild peripancreatic stranding primarily in the region of the pancreatic head. There is increased stranding in the fat adjacent to the celiac axis and extending into the base of the mesenteric root. Spleen: Normal in size without focal abnormality. Adrenals/Urinary Tract: Adrenal glands are normal in appearance. There is symmetric enhancement and excretion from the kidneys. The ureters are unremarkable. Streak artifact from RIGHT hip hardware partially obscures detail in the region of the bladder but no abnormalities are identified in this region. Stomach/Bowel: Stomach and small bowel loops are normal in appearance. Large stool burden. The appendix is well seen and has a normal appearance. Vascular/Lymphatic: There is atherosclerotic calcification not associated of the abdominal aorta with aneurysm. Small likely reactive lymph nodes are identified at the root of the mesentery and the para-aortic region in the UPPER abdomen. Lymph node measures 9 millimeters in the gastrohepatic ligament on image 21/2. Portacaval lymph node is 1.5 centimeters on image 25/2. Reproductive: Hysterectomy. No adnexal mass. Artifact in the pelvis. Other: No free pelvic fluid. Postoperative changes in the anterior abdominal wall. No hernia. No free pelvic fluid. Musculoskeletal: RIGHT hip arthroplasty. Mild degenerative changes in the LOWER thoracic and lumbar spine. No evidence for acute abnormality. IMPRESSION: 1. Increased peripancreatic stranding consistent with acute pancreatitis. 2. No evidence for pseudocyst or abscess. 3. Likely reactive retroperitoneal lymph nodes. 4. Large  stool burden. 5. Hysterectomy. Aortic Atherosclerosis (ICD10-I70.0). Electronically Signed   By: Nolon Nations M.D.   On: 06/19/2018 09:48   Ct Abdomen Pelvis W Contrast  Result Date: 06/17/2018 CLINICAL DATA:  Generalized abdominal pain for 5 days. EXAM: CT ABDOMEN AND PELVIS WITH CONTRAST TECHNIQUE: Multidetector CT imaging of the abdomen and pelvis was performed using the standard protocol following bolus administration of intravenous contrast. CONTRAST:  141mL OMNIPAQUE IOHEXOL 300 MG/ML  SOLN COMPARISON:  08/13/2015 FINDINGS: Lower chest: No acute abnormality. Hepatobiliary: The dome of the right hepatic lobe is incompletely included this study. The liver is otherwise homogeneous in appearance without space-occupying mass. The gallbladder is normal. Pancreas: Trace edema about the pancreatic head, uncinate process proximal body. Ductal dilatation or mass. Spleen: Normal Adrenals/Urinary Tract: Adrenal glands are unremarkable. Kidneys are normal, without renal calculi, focal lesion, or hydronephrosis. Bladder is unremarkable. Stomach/Bowel: Stomach is within normal limits. Appendix appears normal. No evidence of bowel wall thickening, distention, or inflammatory changes. Moderate stool retention within the colon suspicious for constipation. Vascular/Lymphatic: Aortic atherosclerosis. No enlarged abdominal or pelvic lymph nodes. Reproductive: Status post hysterectomy. No adnexal masses. Other: Ventral mesh repair without recurrence of hernia noted. Midline ventral scarring overlying lower abdomen and pelvis. No free air free fluid. Musculoskeletal: No acute or significant osseous findings. IMPRESSION: 1. Slight peripancreatic edema suspicious for pancreatitis without complicating features. 2. Moderate stool retention noted within the colon. 3. Status post ventral mesh repair without recurrence of hernia noted. Right hip arthroplasty obscures portions of the right hemipelvis. Electronically Signed   By:  Ashley Royalty M.D.   On: 06/17/2018 23:30   None  Pending Labs Unresulted Labs (From admission, onward)   None      Vitals/Pain Today's Vitals   06/19/18 0815 06/19/18 0830 06/19/18 0920 06/19/18 1015  BP:   (!) 140/92 (!) 150/86  Pulse: (!) 48  86 (!) 58  Resp: 15   16  SpO2: 99%  (!) 87% 99%  Weight:      Height:      PainSc:  4       Isolation Precautions No active isolations  Medications Medications  iopamidol (ISOVUE-300) 61 % injection (has no administration in time range)  sodium chloride (PF) 0.9 % injection (has no administration in time range)  sodium chloride 0.9 % bolus 1,000 mL (0 mLs Intravenous Stopped 06/19/18 0854)  fentaNYL (SUBLIMAZE) injection 50 mcg (50 mcg Intravenous Given 06/19/18 0749)  ondansetron (ZOFRAN) injection 4 mg (4 mg Intravenous Given 06/19/18 0747)  iopamidol (ISOVUE-300) 61 % injection 100 mL (100 mLs Intravenous Contrast Given 06/19/18 0859)  0.9 %  sodium chloride infusion (100 mL/hr Intravenous New Bag/Given 06/19/18 1019)    Mobility walks

## 2018-06-19 NOTE — ED Notes (Signed)
Patient transported to CT 

## 2018-06-19 NOTE — ED Triage Notes (Signed)
Patient presents after being evaluated two days for continuing RUQ abd pain caused by pancreatitis. Patient states her pain medications are not working long enough and neither is her nausea medication. Patient states her pain medicine only works for about 2 hours but she can only take it every four. Nausea but no vomiting.

## 2018-06-20 DIAGNOSIS — K859 Acute pancreatitis without necrosis or infection, unspecified: Secondary | ICD-10-CM | POA: Diagnosis not present

## 2018-06-20 DIAGNOSIS — K5900 Constipation, unspecified: Secondary | ICD-10-CM | POA: Diagnosis not present

## 2018-06-20 DIAGNOSIS — R1013 Epigastric pain: Secondary | ICD-10-CM | POA: Diagnosis not present

## 2018-06-20 LAB — BASIC METABOLIC PANEL
Anion gap: 8 (ref 5–15)
BUN: 10 mg/dL (ref 8–23)
CO2: 22 mmol/L (ref 22–32)
Calcium: 8.4 mg/dL — ABNORMAL LOW (ref 8.9–10.3)
Chloride: 109 mmol/L (ref 98–111)
Creatinine, Ser: 0.79 mg/dL (ref 0.44–1.00)
GFR calc Af Amer: >60 mL/min (ref >60)
GFR calc non Af Amer: >60 mL/min (ref >60)
Glucose, Bld: 147 mg/dL — ABNORMAL HIGH (ref 70–99)
Potassium: 4 mmol/L (ref 3.5–5.1)
Sodium: 139 mmol/L (ref 135–145)

## 2018-06-20 LAB — HIV ANTIBODY (ROUTINE TESTING W REFLEX): HIV Screen 4th Generation wRfx: NONREACTIVE

## 2018-06-20 LAB — LIPID PANEL
Cholesterol: 130 mg/dL (ref 0–200)
HDL: 46 mg/dL (ref >40)
LDL Cholesterol: 73 mg/dL (ref 0–99)
Total CHOL/HDL Ratio: 2.8 RATIO
Triglycerides: 53 mg/dL (ref <150)
VLDL: 11 mg/dL (ref 0–40)

## 2018-06-20 LAB — GLUCOSE, CAPILLARY
Glucose-Capillary: 129 mg/dL — ABNORMAL HIGH (ref 70–99)
Glucose-Capillary: 138 mg/dL — ABNORMAL HIGH (ref 70–99)
Glucose-Capillary: 101 mg/dL — ABNORMAL HIGH (ref 70–99)
Glucose-Capillary: 148 mg/dL — ABNORMAL HIGH (ref 70–99)
Glucose-Capillary: 163 mg/dL — ABNORMAL HIGH (ref 70–99)
Glucose-Capillary: 167 mg/dL — ABNORMAL HIGH (ref 70–99)

## 2018-06-20 MED ORDER — SENNOSIDES-DOCUSATE SODIUM 8.6-50 MG PO TABS: 1.0000 | ORAL_TABLET | Freq: Two times a day (BID) | ORAL | Status: DC

## 2018-06-20 MED ORDER — KCL IN DEXTROSE-NACL 20-5-0.9 MEQ/L-%-% IV SOLN
INTRAVENOUS | Status: DC
  Administered 2018-06-20: 14:00:00 via INTRAVENOUS
  Filled 2018-06-20: qty 1000

## 2018-06-20 MED ORDER — POLYETHYLENE GLYCOL 3350 17 G PO PACK
17.0000 g | PACK | Freq: Two times a day (BID) | ORAL | Status: DC
  Administered 2018-06-20 – 2018-06-22 (×5): 17 g via ORAL
  Filled 2018-06-20 (×6): qty 1

## 2018-06-20 MED ORDER — DEXTROSE-NACL 5-0.45 % IV SOLN
INTRAVENOUS | Status: AC
  Administered 2018-06-20 – 2018-06-21 (×3): via INTRAVENOUS

## 2018-06-20 MED ORDER — SENNOSIDES-DOCUSATE SODIUM 8.6-50 MG PO TABS
2.0000 | ORAL_TABLET | Freq: Two times a day (BID) | ORAL | Status: DC
  Administered 2018-06-20 – 2018-06-23 (×6): 2 via ORAL
  Filled 2018-06-20 (×6): qty 2

## 2018-06-20 NOTE — Progress Notes (Signed)
PROGRESS NOTE  [name] VHQ:[id number] DOB: [birth year] DOA: 06/19/2018 PCP: Antony Contras, MD  HPI/Brief Narrative  [name] is a 62 y.o. year old female with medical history significant for previous history of pancreatitis related to HCTZ (2017), 2 diabetes, HTN, GERD who presented on 06/19/2018 wit nausea, vomiting and epigastric abdominal pain with inability to tolerate p.o. intake and was found to have acute pancreatitis.  Subjective Still same amount of pain Some nausea no vomiting Ready to try liquid diet  Assessment/Plan:  #Acute pancreatitis, mild.  Still having persistent abdominal pain with nausea.  Will like to try diet, will advance from n.p.o. to clear monitor.  CT imaging confirms pancreatitis, lipase within normal limits.  CMP shows no other abnormalities.  Unclear nidus, no new medications, obtain lipid panel for completion of work-up.  Continue D5 half-normal saline with supportive care   #Hypokalemia, resolved.  Discontinue supplementation via IV fluids.  Monitor BMP  #Large stool burden.  Evident on CT this could be contributing to abdominal pain, did not improve with suppository, will optimize bowel regimen  #Hypertension, at goal.  Continue home BP meds  #Type 2 diabetes.  Holding home oral hypoglycemics.  Monitor CBG, sliding scale as needed.  #GERD, stable continue PPI   Cultures:  None   DVT prophylaxis: Consultants:  None    Procedures:  None  Antimicrobials:  Code Status: Full code  Family Communication: No family at bedside  Disposition Plan: Monitor advancing diet, continue IV fluids given limited p.o. intake, improved bowel regimen        Objective: Vitals:   06/19/18 1219 06/19/18 1352 06/19/18 2113 06/20/18 0454  BP: (!) 131/99 108/67 (!) 122/91 136/75  Pulse: 60 (!) 57 (!) 57 (!) 56  Resp: 17 17 16 16   Temp: 98.9 F (37.2 C) 98.2 F (36.8 C) 99 F (37.2 C) 97.8 F (36.6 C)  TempSrc: Oral Oral Oral Oral   SpO2: 99% 96% 98% 100%  Weight:      Height:        Intake/Output Summary (Last 24 hours) at 06/20/2018 1336 Last data filed at 06/20/2018 4132 Gross per 24 hour  Intake 1573.85 ml  Output 1850 ml  Net -276.15 ml   Filed Weights   06/19/18 0631  Weight: 90.7 kg    Exam:  Constitutional:normal appearing female, no acute distress Eyes: EOMI, anicteric, normal conjunctivae ENMT: Oropharynx with moist mucous membranes, normal dentition Neck: FROM Cardiovascular: RRR no MRGs, with no peripheral edema Respiratory: Normal respiratory effort, clear breath sounds  Abdomen: Soft,non-tender with deep palpation, normal bowel sounds Skin: No rash ulcers, or lesions. Without skin tenting  Neurologic: Grossly no focal neuro deficit. Psychiatric:Appropriate affect, and mood. Mental status AAOx3  Data Reviewed: CBC: Recent Labs  Lab 06/17/18 1719 06/19/18 0740  WBC 7.5 7.9  NEUTROABS  --  5.5  HGB 13.6 13.6  HCT 43.6 42.9  MCV 89.5 91.1  PLT 341 440   Basic Metabolic Panel: Recent Labs  Lab 06/17/18 1719 06/19/18 0740 06/20/18 0408  NA 142 139 139  K 3.7 3.5 4.0  CL 105 104 109  CO2 31 26 22   GLUCOSE 104* 128* 147*  BUN 14 14 10   CREATININE 0.86 0.82 0.79  CALCIUM 9.6 9.2 8.4*   GFR: Estimated Creatinine Clearance: 77.3 mL/min (by C-G formula based on SCr of 0.79 mg/dL). Liver Function Tests: Recent Labs  Lab 06/17/18 1719 06/19/18 0740  AST 13* 13*  ALT 13 14  ALKPHOS 72  66  BILITOT 0.5 0.6  PROT 7.0 7.5  ALBUMIN 3.8 4.0   Recent Labs  Lab 06/17/18 1719 06/19/18 0740  LIPASE 57* 48   No results for input(s): AMMONIA in the last 168 hours. Coagulation Profile: No results for input(s): INR, PROTIME in the last 168 hours. Cardiac Enzymes: No results for input(s): CKTOTAL, CKMB, CKMBINDEX, TROPONINI in the last 168 hours. BNP (last 3 results) No results for input(s): PROBNP in the last 8760 hours. HbA1C: No results for input(s): HGBA1C in the last  72 hours. CBG: Recent Labs  Lab 06/19/18 1948 06/19/18 2334 06/20/18 0422 06/20/18 0740 06/20/18 1239  GLUCAP 118* 115* 129* 138* 101*   Lipid Profile: No results for input(s): CHOL, HDL, LDLCALC, TRIG, CHOLHDL, LDLDIRECT in the last 72 hours. Thyroid Function Tests: No results for input(s): TSH, T4TOTAL, FREET4, T3FREE, THYROIDAB in the last 72 hours. Anemia Panel: No results for input(s): VITAMINB12, FOLATE, FERRITIN, TIBC, IRON, RETICCTPCT in the last 72 hours. Urine analysis:    Component Value Date/Time   COLORURINE YELLOW 06/19/2018 0741   APPEARANCEUR CLEAR 06/19/2018 0741   LABSPEC 1.033 (H) 06/19/2018 0741   PHURINE 5.0 06/19/2018 0741   GLUCOSEU >=500 (A) 06/19/2018 0741   HGBUR NEGATIVE 06/19/2018 0741   BILIRUBINUR NEGATIVE 06/19/2018 0741   KETONESUR 20 (A) 06/19/2018 0741   PROTEINUR NEGATIVE 06/19/2018 0741   UROBILINOGEN 1.0 11/12/2014 1944   NITRITE NEGATIVE 06/19/2018 0741   LEUKOCYTESUR NEGATIVE 06/19/2018 0741   Sepsis Labs: @LABRCNTIP (procalcitonin:4,lacticidven:4)  )No results found for this or any previous visit (from the past 240 hour(s)).    Studies: No results found.  Scheduled Meds: . amLODipine  5 mg Oral Daily  . enoxaparin (LOVENOX) injection  40 mg Subcutaneous Q24H  . insulin aspart  0-9 Units Subcutaneous Q4H  . latanoprost  1 drop Both Eyes QHS  . losartan  100 mg Oral Daily  . pantoprazole  40 mg Oral Daily  . senna  2 tablet Oral BID  . sodium chloride flush  3 mL Intravenous Q12H    Continuous Infusions: . dextrose 5 % and 0.9 % NaCl with KCl 20 mEq/L       LOS: 0 days     Desiree Hane, MD [facility] Pager [phone]  If 7PM-7AM, please contact night-coverage [email or web address] Password [email or web address] 06/20/2018, 1:36 PM

## 2018-06-21 ENCOUNTER — Observation Stay (HOSPITAL_COMMUNITY): Payer: BC Managed Care – PPO

## 2018-06-21 DIAGNOSIS — Z8249 Family history of ischemic heart disease and other diseases of the circulatory system: Secondary | ICD-10-CM | POA: Diagnosis not present

## 2018-06-21 DIAGNOSIS — Z87891 Personal history of nicotine dependence: Secondary | ICD-10-CM | POA: Diagnosis not present

## 2018-06-21 DIAGNOSIS — K5790 Diverticulosis of intestine, part unspecified, without perforation or abscess without bleeding: Secondary | ICD-10-CM | POA: Diagnosis present

## 2018-06-21 DIAGNOSIS — Z7984 Long term (current) use of oral hypoglycemic drugs: Secondary | ICD-10-CM | POA: Diagnosis not present

## 2018-06-21 DIAGNOSIS — Z880 Allergy status to penicillin: Secondary | ICD-10-CM | POA: Diagnosis not present

## 2018-06-21 DIAGNOSIS — M1990 Unspecified osteoarthritis, unspecified site: Secondary | ICD-10-CM | POA: Diagnosis present

## 2018-06-21 DIAGNOSIS — Z803 Family history of malignant neoplasm of breast: Secondary | ICD-10-CM | POA: Diagnosis not present

## 2018-06-21 DIAGNOSIS — Z800 Family history of malignant neoplasm of digestive organs: Secondary | ICD-10-CM | POA: Diagnosis not present

## 2018-06-21 DIAGNOSIS — R1011 Right upper quadrant pain: Secondary | ICD-10-CM | POA: Diagnosis present

## 2018-06-21 DIAGNOSIS — H409 Unspecified glaucoma: Secondary | ICD-10-CM | POA: Diagnosis present

## 2018-06-21 DIAGNOSIS — Z96641 Presence of right artificial hip joint: Secondary | ICD-10-CM | POA: Diagnosis present

## 2018-06-21 DIAGNOSIS — Z8719 Personal history of other diseases of the digestive system: Secondary | ICD-10-CM | POA: Diagnosis not present

## 2018-06-21 DIAGNOSIS — K5900 Constipation, unspecified: Secondary | ICD-10-CM | POA: Diagnosis present

## 2018-06-21 DIAGNOSIS — I10 Essential (primary) hypertension: Secondary | ICD-10-CM | POA: Diagnosis present

## 2018-06-21 DIAGNOSIS — Z825 Family history of asthma and other chronic lower respiratory diseases: Secondary | ICD-10-CM | POA: Diagnosis not present

## 2018-06-21 DIAGNOSIS — Z79899 Other long term (current) drug therapy: Secondary | ICD-10-CM | POA: Diagnosis not present

## 2018-06-21 DIAGNOSIS — Z833 Family history of diabetes mellitus: Secondary | ICD-10-CM | POA: Diagnosis not present

## 2018-06-21 DIAGNOSIS — J45991 Cough variant asthma: Secondary | ICD-10-CM | POA: Diagnosis present

## 2018-06-21 DIAGNOSIS — K859 Acute pancreatitis without necrosis or infection, unspecified: Secondary | ICD-10-CM

## 2018-06-21 DIAGNOSIS — Z90710 Acquired absence of both cervix and uterus: Secondary | ICD-10-CM | POA: Diagnosis not present

## 2018-06-21 DIAGNOSIS — E119 Type 2 diabetes mellitus without complications: Secondary | ICD-10-CM | POA: Diagnosis present

## 2018-06-21 DIAGNOSIS — Z7951 Long term (current) use of inhaled steroids: Secondary | ICD-10-CM | POA: Diagnosis not present

## 2018-06-21 DIAGNOSIS — E860 Dehydration: Secondary | ICD-10-CM | POA: Diagnosis present

## 2018-06-21 DIAGNOSIS — R1013 Epigastric pain: Secondary | ICD-10-CM | POA: Diagnosis not present

## 2018-06-21 DIAGNOSIS — K219 Gastro-esophageal reflux disease without esophagitis: Secondary | ICD-10-CM | POA: Diagnosis present

## 2018-06-21 DIAGNOSIS — K8500 Idiopathic acute pancreatitis without necrosis or infection: Secondary | ICD-10-CM | POA: Diagnosis present

## 2018-06-21 DIAGNOSIS — Z888 Allergy status to other drugs, medicaments and biological substances status: Secondary | ICD-10-CM | POA: Diagnosis not present

## 2018-06-21 LAB — BASIC METABOLIC PANEL
Anion gap: 6 (ref 5–15)
BUN: 6 mg/dL — ABNORMAL LOW (ref 8–23)
CO2: 27 mmol/L (ref 22–32)
Calcium: 9.1 mg/dL (ref 8.9–10.3)
Chloride: 105 mmol/L (ref 98–111)
Creatinine, Ser: 0.82 mg/dL (ref 0.44–1.00)
GFR calc Af Amer: >60 mL/min (ref >60)
GFR calc non Af Amer: >60 mL/min (ref >60)
Glucose, Bld: 140 mg/dL — ABNORMAL HIGH (ref 70–99)
Potassium: 3.9 mmol/L (ref 3.5–5.1)
Sodium: 138 mmol/L (ref 135–145)

## 2018-06-21 LAB — GLUCOSE, CAPILLARY
Glucose-Capillary: 120 mg/dL — ABNORMAL HIGH (ref 70–99)
Glucose-Capillary: 142 mg/dL — ABNORMAL HIGH (ref 70–99)
Glucose-Capillary: 144 mg/dL — ABNORMAL HIGH (ref 70–99)
Glucose-Capillary: 167 mg/dL — ABNORMAL HIGH (ref 70–99)
Glucose-Capillary: 170 mg/dL — ABNORMAL HIGH (ref 70–99)
Glucose-Capillary: 182 mg/dL — ABNORMAL HIGH (ref 70–99)

## 2018-06-21 MED ORDER — SORBITOL 70 % SOLN
27.0000 mL | Freq: Once | Status: AC
  Administered 2018-06-21: 27 mL via ORAL
  Filled 2018-06-21: qty 30

## 2018-06-21 MED ORDER — FENTANYL CITRATE (PF) 100 MCG/2ML IJ SOLN
25.0000 ug | INTRAMUSCULAR | Status: DC | PRN
  Administered 2018-06-21 – 2018-06-22 (×2): 25 ug via INTRAVENOUS
  Filled 2018-06-21 (×2): qty 2

## 2018-06-21 MED ORDER — DEXTROSE-NACL 5-0.45 % IV SOLN
INTRAVENOUS | Status: AC
  Administered 2018-06-21 – 2018-06-22 (×4): via INTRAVENOUS

## 2018-06-21 NOTE — Progress Notes (Signed)
PROGRESS NOTE  [name] AST:[id number] DOB: 14-Mar-1956 DOA: 06/19/2018 PCP: Antony Contras, MD  HPI/Brief Narrative  [name] is a 62 y.o. year old female with medical history significant for previous history of pancreatitis related to HCTZ (2017), 2 diabetes, HTN, GERD who presented on 06/19/2018 wit nausea, vomiting and epigastric abdominal pain with inability to tolerate p.o. intake and was found to have acute pancreatitis.  Subjective Still no bM Went up to regular diet but barely any intake Still nausea no vomiting Abdominal pain intermittent  Assessment/Plan:  #Acute pancreatitis, persistent.  Still having abdominal pain with nausea, was able to advance diet to regular diet, however still having very minimal p.o. intake to prevent dehydration so will require continued IV fluids (D5 half-normal saline) for support.  Additionally still requiring IV fentanyl in addition to Norco for pain control.  Right upper quadrant ultrasound obtained this afternoon negative for gallstones.  Lipid panel unremarkable.  Unclear nidus, no new medications.  #Hypokalemia, resolved.  Discontinue supplementation via IV fluids.  Monitor BMP  #Large stool burden.  Evident on CT this could be contributing to abdominal pain, did not improve with suppository, or edition to schedule senna docusate and MiraLAX.  Will add sorbitol solution  #Hypertension, at goal.  Continue home BP meds  #Type 2 diabetes.  Holding home oral hypoglycemics.  Monitor CBG, sliding scale as needed.  #GERD, stable continue PPI  #Increase hepatic echogenicity consistent with steatosis.  Found on right upper quadrant ultrasound.  Will need outpatient monitoring.   Cultures:  None   DVT prophylaxis: Consultants:  None    Procedures:  None  Antimicrobials:  Code Status: Full code  Family Communication: No family at bedside  Disposition Plan: Criteria for inpatient care given need for persistent IV  fluids due to minimal p.o. intake in the setting of persistent abdominal pain related to combined acute pancreatitis and significant stool burden.  Continue IV fluids, optimizing bowel regimen, providing IV antiemetics were nausea and pain control         Objective: Vitals:   06/20/18 1421 06/20/18 2113 06/21/18 0609 06/21/18 0900  BP: 117/78 107/65 139/85 103/68  Pulse: (!) 58 (!) 56 (!) 55 (!) 58  Resp: 14 18 18    Temp: 98.7 F (37.1 C) 98.7 F (37.1 C) 98.5 F (36.9 C)   TempSrc: Oral Oral Oral   SpO2: 100% 100% 100%   Weight:      Height:        Intake/Output Summary (Last 24 hours) at 06/21/2018 1201 Last data filed at 06/21/2018 9892 Gross per 24 hour  Intake 3243.67 ml  Output 3100 ml  Net 143.67 ml   Filed Weights   06/19/18 0631  Weight: 90.7 kg    Exam:  Constitutional:normal appearing female, no acute distress Eyes: EOMI, anicteric, normal conjunctivae ENMT: Oropharynx with moist mucous membranes, normal dentition Neck: FROM Cardiovascular: RRR no MRGs, with no peripheral edema Respiratory: Normal respiratory effort, clear breath sounds  Abdomen: Soft,non-tender with deep palpation, normal bowel sounds Skin: No rash ulcers, or lesions. Without skin tenting  Neurologic: Grossly no focal neuro deficit. Psychiatric:Appropriate affect, and mood. Mental status AAOx3  Data Reviewed: CBC: Recent Labs  Lab 06/17/18 1719 06/19/18 0740  WBC 7.5 7.9  NEUTROABS  --  5.5  HGB 13.6 13.6  HCT 43.6 42.9  MCV 89.5 91.1  PLT 341 119   Basic Metabolic Panel: Recent Labs  Lab 06/17/18 1719 06/19/18 0740 06/20/18 0408  NA 142 139  139  K 3.7 3.5 4.0  CL 105 104 109  CO2 31 26 22   GLUCOSE 104* 128* 147*  BUN 14 14 10   CREATININE 0.86 0.82 0.79  CALCIUM 9.6 9.2 8.4*   GFR: Estimated Creatinine Clearance: 77.3 mL/min (by C-G formula based on SCr of 0.79 mg/dL). Liver Function Tests: Recent Labs  Lab 06/17/18 1719 06/19/18 0740  AST 13* 13*  ALT  13 14  ALKPHOS 72 66  BILITOT 0.5 0.6  PROT 7.0 7.5  ALBUMIN 3.8 4.0   Recent Labs  Lab 06/17/18 1719 06/19/18 0740  LIPASE 57* 48   No results for input(s): AMMONIA in the last 168 hours. Coagulation Profile: No results for input(s): INR, PROTIME in the last 168 hours. Cardiac Enzymes: No results for input(s): CKTOTAL, CKMB, CKMBINDEX, TROPONINI in the last 168 hours. BNP (last 3 results) No results for input(s): PROBNP in the last 8760 hours. HbA1C: No results for input(s): HGBA1C in the last 72 hours. CBG: Recent Labs  Lab 06/20/18 2003 06/20/18 2309 06/21/18 0411 06/21/18 0807 06/21/18 1140  GLUCAP 163* 167* 142* 182* 144*   Lipid Profile: Recent Labs    06/20/18 1908  CHOL 130  HDL 46  LDLCALC 73  TRIG 53  CHOLHDL 2.8   Thyroid Function Tests: No results for input(s): TSH, T4TOTAL, FREET4, T3FREE, THYROIDAB in the last 72 hours. Anemia Panel: No results for input(s): VITAMINB12, FOLATE, FERRITIN, TIBC, IRON, RETICCTPCT in the last 72 hours. Urine analysis:    Component Value Date/Time   COLORURINE YELLOW 06/19/2018 0741   APPEARANCEUR CLEAR 06/19/2018 0741   LABSPEC 1.033 (H) 06/19/2018 0741   PHURINE 5.0 06/19/2018 0741   GLUCOSEU >=500 (A) 06/19/2018 0741   HGBUR NEGATIVE 06/19/2018 0741   BILIRUBINUR NEGATIVE 06/19/2018 0741   KETONESUR 20 (A) 06/19/2018 0741   PROTEINUR NEGATIVE 06/19/2018 0741   UROBILINOGEN 1.0 11/12/2014 1944   NITRITE NEGATIVE 06/19/2018 0741   LEUKOCYTESUR NEGATIVE 06/19/2018 0741   Sepsis Labs: @LABRCNTIP (procalcitonin:4,lacticidven:4)  )No results found for this or any previous visit (from the past 240 hour(s)).    Studies: No results found.  Scheduled Meds: . amLODipine  5 mg Oral Daily  . enoxaparin (LOVENOX) injection  40 mg Subcutaneous Q24H  . insulin aspart  0-9 Units Subcutaneous Q4H  . latanoprost  1 drop Both Eyes QHS  . losartan  100 mg Oral Daily  . pantoprazole  40 mg Oral Daily  . polyethylene  glycol  17 g Oral BID  . senna-docusate  2 tablet Oral BID  . sodium chloride flush  3 mL Intravenous Q12H    Continuous Infusions: . dextrose 5 % and 0.45% NaCl 100 mL/hr at 06/21/18 0418     LOS: 0 days     Desiree Hane, MD [facility] Pager [phone]  If 7PM-7AM, please contact night-coverage [email or web address] Password [email or web address] 06/21/2018, 12:01 PM

## 2018-06-22 LAB — GLUCOSE, CAPILLARY
Glucose-Capillary: 129 mg/dL — ABNORMAL HIGH (ref 70–99)
Glucose-Capillary: 130 mg/dL — ABNORMAL HIGH (ref 70–99)
Glucose-Capillary: 145 mg/dL — ABNORMAL HIGH (ref 70–99)
Glucose-Capillary: 153 mg/dL — ABNORMAL HIGH (ref 70–99)
Glucose-Capillary: 187 mg/dL — ABNORMAL HIGH (ref 70–99)
Glucose-Capillary: 214 mg/dL — ABNORMAL HIGH (ref 70–99)

## 2018-06-22 NOTE — Progress Notes (Signed)
PROGRESS NOTE    [name]  GEX:[id number] DOB: March 31, 1956 DOA: 06/19/2018 PCP: Antony Contras, MD   Brief Narrative:63 y.o. year old female with medical history significant for previous history of pancreatitis related to HCTZ (2017), 2 diabetes, HTN, GERD who presented on 06/19/2018 wit nausea, vomiting and epigastric abdominal pain with inability to tolerate p.o. intake and was found to have acute pancreatitis.  Assessment & Plan:   Principal Problem:   Acute pancreatitis Active Problems:   Diabetes mellitus without complication (HCC)   Essential hypertension, benign   Cough variant asthma   Abdominal pain, epigastric   GERD (gastroesophageal reflux disease)   Constipation   #Acute pancreatitis, persistent.  Patient failed outpatient management.  Still having abdominal pain with nausea and vomited twice overnight.  Not able to advance diet.  We will continue slow IV hydration and encourage her to try full liquids.   Additionally still requiring IV fentanyl in addition to Norco for pain control.  Right upper quadrant ultrasound obtained this afternoon negative for gallstones.  Lipid panel unremarkable.  Unclear nidus, no new medications.?  There was a concern if pancreatitis was related to HCTZ which was also stopped prior to admission.  #Hypokalemia, resolved.   #Large stool burden-resolved  #Hypertension, at goal.  Continue home BP meds  #Type 2 diabetes.  Holding home oral hypoglycemics.  Monitor CBG, sliding scale as needed.  #GERD, stable continue PPI  Increase hepatic echogenicity consistent with steatosis.  Found on right upper quadrant ultrasound.  Will need outpatient monitoring.  Severity patient still dependent on IV hydration IV pain medication has not been able to tolerate a diet yet.  Estimated body mass index is 36.58 kg/m as calculated from the following:   Height as of this encounter: 5\' 2"  (1.575 m).   Weight as of this encounter: 90.7 kg.  DVT  prophylaxis:  Code Status: Full code Family Communication none at bedside Disposition Plan: Pending clinical improvement patient is still not able to tolerate a full liquid diet continues with nausea and vomiting dependent on IV fluids and IV pain medications. Consultants: None Procedures: None Antimicrobials: None Subjective: Complains of nausea vomiting twice overnight had a bowel movement continues to have abdominal pain  Objective: Vitals:   06/21/18 0900 06/21/18 1359 06/21/18 2058 06/22/18 0543  BP: 103/68 110/76 105/63 136/88  Pulse: (!) 58 (!) 54 72 (!) 59  Resp:  16 18 16   Temp:  98.7 F (37.1 C) 98.3 F (36.8 C) 98.3 F (36.8 C)  TempSrc:   Oral Oral  SpO2:  99% 98% 98%  Weight:      Height:        Intake/Output Summary (Last 24 hours) at 06/22/2018 1011 Last data filed at 06/22/2018 1000 Gross per 24 hour  Intake 1503.75 ml  Output 2300 ml  Net -796.25 ml   Filed Weights   06/19/18 0631  Weight: 90.7 kg    Examination:  General exam: Appears calm and comfortable  Respiratory system: Clear to auscultation. Respiratory effort normal. Cardiovascular system: S1 & S2 heard, RRR. No JVD, murmurs, rubs, gallops or clicks. No pedal edema. Gastrointestinal system: Abdomen is nondistended, soft and nontender. No organomegaly or masses felt. Normal bowel sounds heard. Central nervous system: Alert and oriented. No focal neurological deficits. Extremities: Symmetric 5 x 5 power. Skin: No rashes, lesions or ulcers Psychiatry: Judgement and insight appear normal. Mood & affect appropriate.     Data Reviewed: I have personally reviewed following labs and imaging studies  CBC: Recent Labs  Lab 06/17/18 1719 06/19/18 0740  WBC 7.5 7.9  NEUTROABS  --  5.5  HGB 13.6 13.6  HCT 43.6 42.9  MCV 89.5 91.1  PLT 341 025   Basic Metabolic Panel: Recent Labs  Lab 06/17/18 1719 06/19/18 0740 06/20/18 0408 06/21/18 1407  NA 142 139 139 138  K 3.7 3.5 4.0 3.9  CL 105  104 109 105  CO2 31 26 22 27   GLUCOSE 104* 128* 147* 140*  BUN 14 14 10  6*  CREATININE 0.86 0.82 0.79 0.82  CALCIUM 9.6 9.2 8.4* 9.1   GFR: Estimated Creatinine Clearance: 75.4 mL/min (by C-G formula based on SCr of 0.82 mg/dL). Liver Function Tests: Recent Labs  Lab 06/17/18 1719 06/19/18 0740  AST 13* 13*  ALT 13 14  ALKPHOS 72 66  BILITOT 0.5 0.6  PROT 7.0 7.5  ALBUMIN 3.8 4.0   Recent Labs  Lab 06/17/18 1719 06/19/18 0740  LIPASE 57* 48   No results for input(s): AMMONIA in the last 168 hours. Coagulation Profile: No results for input(s): INR, PROTIME in the last 168 hours. Cardiac Enzymes: No results for input(s): CKTOTAL, CKMB, CKMBINDEX, TROPONINI in the last 168 hours. BNP (last 3 results) No results for input(s): PROBNP in the last 8760 hours. HbA1C: No results for input(s): HGBA1C in the last 72 hours. CBG: Recent Labs  Lab 06/21/18 1600 06/21/18 2007 06/21/18 2348 06/22/18 0401 06/22/18 0757  GLUCAP 120* 170* 167* 130* 129*   Lipid Profile: Recent Labs    06/20/18 1908  CHOL 130  HDL 46  LDLCALC 73  TRIG 53  CHOLHDL 2.8   Thyroid Function Tests: No results for input(s): TSH, T4TOTAL, FREET4, T3FREE, THYROIDAB in the last 72 hours. Anemia Panel: No results for input(s): VITAMINB12, FOLATE, FERRITIN, TIBC, IRON, RETICCTPCT in the last 72 hours. Sepsis Labs: Recent Labs  Lab 06/19/18 0747  LATICACIDVEN 0.49*    No results found for this or any previous visit (from the past 240 hour(s)).       Radiology Studies: US Abdomen Limited Ruq  Result Date: 06/21/2018 CLINICAL DATA:  Pancreatitis EXAM: ULTRASOUND ABDOMEN LIMITED RIGHT UPPER QUADRANT COMPARISON:  None. FINDINGS: Gallbladder: No gallstones or wall thickening visualized. No sonographic Murphy sign noted by sonographer. Common bile duct: Diameter: 5.4 mm Liver: Increased hepatic echogenicity. No focal hepatic abnormality portal vein is patent on color Doppler imaging with normal  direction of blood flow towards the liver. IMPRESSION: 1. Negative for gallstones 2. Increased hepatic echogenicity consistent with steatosis and or hepatocellular disease. Electronically Signed   By: Donavan Foil M.D.   On: 06/21/2018 16:54        Scheduled Meds: . amLODipine  5 mg Oral Daily  . enoxaparin (LOVENOX) injection  40 mg Subcutaneous Q24H  . insulin aspart  0-9 Units Subcutaneous Q4H  . latanoprost  1 drop Both Eyes QHS  . losartan  100 mg Oral Daily  . pantoprazole  40 mg Oral Daily  . polyethylene glycol  17 g Oral BID  . senna-docusate  2 tablet Oral BID  . sodium chloride flush  3 mL Intravenous Q12H   Continuous Infusions: . dextrose 5 % and 0.45% NaCl 100 mL/hr at 06/22/18 0940     LOS: 1 day     Georgette Shell, MD [facility]  If 7PM-7AM, please contact night-coverage [email or web address] Password [email or web address] 06/22/2018, 10:11 AM

## 2018-06-23 LAB — GLUCOSE, CAPILLARY
Glucose-Capillary: 127 mg/dL — ABNORMAL HIGH (ref 70–99)
Glucose-Capillary: 140 mg/dL — ABNORMAL HIGH (ref 70–99)
Glucose-Capillary: 146 mg/dL — ABNORMAL HIGH (ref 70–99)

## 2018-06-23 MED ORDER — GLIMEPIRIDE 4 MG PO TABS: 2.0000 mg | ORAL_TABLET | Freq: Every day | ORAL | 0 refills | Status: DC

## 2018-06-23 NOTE — Discharge Summary (Signed)
Physician Discharge Summary  [name] AST:[id number] DOB: March 21, 1956 DOA: 06/19/2018  PCP: Antony Contras, MD  Admit date: 06/19/2018 Discharge date: 06/23/2018  Admitted From: Home Disposition: Home home Recommendations for Outpatient Follow-up:  1. Follow up with PCP in 1-2 weeks 2. Please obtain BMP/CBC in one week 3. Consider restarting Glucophage, Jardiance, and antihypertensives as an outpatient as these were stopped due to soft blood pressure as well as low to low normal blood sugar.  She was treated with sliding scale insulin during this hospital stay.  Home Health none Equipment/Devices: None  Discharge Condition: Stable CODE STATUS full code Diet recommendation: Cardiac Brief/Interim Summary:63 y.o.year old femalewith medical history significant for previous history of pancreatitis related to HCTZ (2017), 2 diabetes, HTN, GERD who presented on 12/29/2019wit nausea, vomiting and epigastric abdominal pain with inability to tolerate p.o. intake and was found to have acute pancreatitis.    Discharge Diagnoses:  Principal Problem:   Acute pancreatitis Active Problems:   Diabetes mellitus without complication (HCC)   Essential hypertension, benign   Cough variant asthma   Abdominal pain, epigastric   GERD (gastroesophageal reflux disease)   Constipation  Acute pancreatitis idiopathic Patient was admitted after failed outpatient management. Patient was able to tolerate a soft diet prior to discharge.  She will be discharged home on a cardiac diet have advised her to stay away from fatty and fried foods.  Right upper quadrant ultrasound showed no signs of gallstones normal lipid panel.   #Hypokalemia, resolved.   #Large stool burden-resolved  #Hypertension-antihypertensives on hold due to soft BP  #Type 2 diabetes. Restarted Amaryl at 2 mg daily.  She was on 8 mg daily at home.  Blood sugars have been normal to low normal here.  She is also on Jardiance and  metformin which I am stopping  consider restart as an outpatient.  #GERD, stable continue PPI  Increase hepatic echogenicity consistent with steatosis. Found on right upper quadrant ultrasound. Will need outpatient monitoring.                    Estimated body mass index is 36.58 kg/m as calculated from the following:   Height as of this encounter: '5\' 2"'  (1.575 m).   Weight as of this encounter: 90.7 kg.  Discharge Instructions  Discharge Instructions    Call MD for:  difficulty breathing, headache or visual disturbances   Complete by:  As directed    Call MD for:  persistant nausea and vomiting   Complete by:  As directed    Diet - low sodium heart healthy   Complete by:  As directed    Increase activity slowly   Complete by:  As directed      Allergies as of 06/23/2018      Reactions   Diclofenac    Hives    Penicillins Hives   Has patient had a PCN reaction causing immediate rash, facial/tongue/throat swelling, SOB or lightheadedness with hypotension: Yes Has patient had a PCN reaction causing severe rash involving mucus membranes or skin necrosis: Yes Has patient had a PCN reaction that required hospitalization No Has patient had a PCN reaction occurring within the last 10 years: No. If all of the above answers are "NO", then may proceed with Cephalosporin use.      Medication List    STOP taking these medications   amLODipine 5 MG tablet Commonly known as:  NORVASC   JARDIANCE 25 MG Tabs tablet Generic drug:  empagliflozin  losartan 100 MG tablet Commonly known as:  COZAAR   metFORMIN 1000 MG tablet Commonly known as:  GLUCOPHAGE     TAKE these medications   blood glucose meter kit and supplies Kit Dispense based on patient and insurance preference. Use up to four times daily as directed. (FOR ICD-9 250.00, 250.01).   budesonide-formoterol 80-4.5 MCG/ACT inhaler Commonly known as:  SYMBICORT Take 2 puffs first thing in am and then  another 2 puffs about 12 hours later. What changed:    how much to take  how to take this  when to take this  reasons to take this  additional instructions   glimepiride 4 MG tablet Commonly known as:  AMARYL Take 0.5 tablets (2 mg total) by mouth daily with breakfast. What changed:  how much to take   glucose blood test strip Use as instructed   HYDROcodone-acetaminophen 5-325 MG tablet Commonly known as:  NORCO/VICODIN Take 1 tablet by mouth every 4 (four) hours as needed. What changed:  reasons to take this   MELATONIN PO Take 1 tablet by mouth at bedtime as needed (sleep).   omeprazole 40 MG capsule Commonly known as:  PRILOSEC Take 40 mg by mouth daily.   ondansetron 4 MG disintegrating tablet Commonly known as:  ZOFRAN ODT Take 1 tablet (4 mg total) by mouth every 8 (eight) hours as needed. What changed:  reasons to take this   ONE TOUCH LANCETS Misc 1 Package by Does not apply route every morning.   travoprost (benzalkonium) 0.004 % ophthalmic solution Commonly known as:  TRAVATAN Place 1 drop into both eyes at bedtime.      Follow-up Information    Antony Contras, MD Follow up.   Specialty:  Family Medicine Why:  jardiance metformin and antihypertensives have been stopped due to normal to low blood sugars and soft bp.restart them as an out patient as needed Contact information: [place] [id number] [phone]          Allergies  Allergen Reactions  . Diclofenac     Hives   . Penicillins Hives    Has patient had a PCN reaction causing immediate rash, facial/tongue/throat swelling, SOB or lightheadedness with hypotension: Yes Has patient had a PCN reaction causing severe rash involving mucus membranes or skin necrosis: Yes Has patient had a PCN reaction that required hospitalization No Has patient had a PCN reaction occurring within the last 10 years: No. If all of the above answers are "NO", then may proceed  with Cephalosporin use.     Consultations:     Procedures/Studies: Ct Abdomen Pelvis W Contrast  Result Date: 06/19/2018 CLINICAL DATA:  Patient presents after being evaluated two days for continuing RUQ abd pain caused by pancreatitis. Patient states her pain medications are not working long enough and neither is her nausea medication. Pain medications are not working well enough. Nausea without vomiting. EXAM: CT ABDOMEN AND PELVIS WITH CONTRAST TECHNIQUE: Multidetector CT imaging of the abdomen and pelvis was performed using the standard protocol following bolus administration of intravenous contrast. CONTRAST:  169m ISOVUE-300 IOPAMIDOL (ISOVUE-300) INJECTION 61% COMPARISON:  06/17/2018 FINDINGS: Lower chest: The lung bases are clear. Heart size is normal. Hepatobiliary: No focal liver abnormality is seen. No radiopaque gallstones, biliary dilatation, or pericholecystic inflammatory changes. Pancreas: There is mild peripancreatic stranding primarily in the region of the pancreatic head. There is increased stranding in the fat adjacent to the celiac axis and extending into the base of the  mesenteric root. Spleen: Normal in size without focal abnormality. Adrenals/Urinary Tract: Adrenal glands are normal in appearance. There is symmetric enhancement and excretion from the kidneys. The ureters are unremarkable. Streak artifact from RIGHT hip hardware partially obscures detail in the region of the bladder but no abnormalities are identified in this region. Stomach/Bowel: Stomach and small bowel loops are normal in appearance. Large stool burden. The appendix is well seen and has a normal appearance. Vascular/Lymphatic: There is atherosclerotic calcification not associated of the abdominal aorta with aneurysm. Small likely reactive lymph nodes are identified at the root of the mesentery and the para-aortic region in the UPPER abdomen. Lymph node measures 9 millimeters in the gastrohepatic ligament on  image 21/2. Portacaval lymph node is 1.5 centimeters on image 25/2. Reproductive: Hysterectomy. No adnexal mass. Artifact in the pelvis. Other: No free pelvic fluid. Postoperative changes in the anterior abdominal wall. No hernia. No free pelvic fluid. Musculoskeletal: RIGHT hip arthroplasty. Mild degenerative changes in the LOWER thoracic and lumbar spine. No evidence for acute abnormality. IMPRESSION: 1. Increased peripancreatic stranding consistent with acute pancreatitis. 2. No evidence for pseudocyst or abscess. 3. Likely reactive retroperitoneal lymph nodes. 4. Large stool burden. 5. Hysterectomy. Aortic Atherosclerosis (ICD10-I70.0). Electronically Signed   By: Nolon Nations M.D.   On: 06/19/2018 09:48   Ct Abdomen Pelvis W Contrast  Result Date: 06/17/2018 CLINICAL DATA:  Generalized abdominal pain for 5 days. EXAM: CT ABDOMEN AND PELVIS WITH CONTRAST TECHNIQUE: Multidetector CT imaging of the abdomen and pelvis was performed using the standard protocol following bolus administration of intravenous contrast. CONTRAST:  126m OMNIPAQUE IOHEXOL 300 MG/ML  SOLN COMPARISON:  08/13/2015 FINDINGS: Lower chest: No acute abnormality. Hepatobiliary: The dome of the right hepatic lobe is incompletely included this study. The liver is otherwise homogeneous in appearance without space-occupying mass. The gallbladder is normal. Pancreas: Trace edema about the pancreatic head, uncinate process proximal body. Ductal dilatation or mass. Spleen: Normal Adrenals/Urinary Tract: Adrenal glands are unremarkable. Kidneys are normal, without renal calculi, focal lesion, or hydronephrosis. Bladder is unremarkable. Stomach/Bowel: Stomach is within normal limits. Appendix appears normal. No evidence of bowel wall thickening, distention, or inflammatory changes. Moderate stool retention within the colon suspicious for constipation. Vascular/Lymphatic: Aortic atherosclerosis. No enlarged abdominal or pelvic lymph nodes.  Reproductive: Status post hysterectomy. No adnexal masses. Other: Ventral mesh repair without recurrence of hernia noted. Midline ventral scarring overlying lower abdomen and pelvis. No free air free fluid. Musculoskeletal: No acute or significant osseous findings. IMPRESSION: 1. Slight peripancreatic edema suspicious for pancreatitis without complicating features. 2. Moderate stool retention noted within the colon. 3. Status post ventral mesh repair without recurrence of hernia noted. Right hip arthroplasty obscures portions of the right hemipelvis. Electronically Signed   By: DAshley RoyaltyM.D.   On: 06/17/2018 23:30   UKoreaAbdomen Limited Ruq  Result Date: 06/21/2018 CLINICAL DATA:  Pancreatitis EXAM: ULTRASOUND ABDOMEN LIMITED RIGHT UPPER QUADRANT COMPARISON:  None. FINDINGS: Gallbladder: No gallstones or wall thickening visualized. No sonographic Murphy sign noted by sonographer. Common bile duct: Diameter: 5.4 mm Liver: Increased hepatic echogenicity. No focal hepatic abnormality portal vein is patent on color Doppler imaging with normal direction of blood flow towards the liver. IMPRESSION: 1. Negative for gallstones 2. Increased hepatic echogenicity consistent with steatosis and or hepatocellular disease. Electronically Signed   By: KDonavan FoilM.D.   On: 06/21/2018 16:54    (Echo, Carotid, EGD, Colonoscopy, ERCP)    Subjective: Sitting up in bed eating breakfast no new  complaints no nausea vomiting had bowel movements after stool softeners.  Discharge Exam: Vitals:   06/23/18 0806 06/23/18 1000  BP: 114/65 127/80  Pulse: (!) 49 62  Resp:  18  Temp:  98.2 F (36.8 C)  SpO2:  98%   Vitals:   06/22/18 2049 06/23/18 0556 06/23/18 0806 06/23/18 1000  BP: 134/76 107/70 114/65 127/80  Pulse: (!) 56 (!) 56 (!) 49 62  Resp: '18 18  18  ' Temp: 98.3 F (36.8 C) 98.1 F (36.7 C)  98.2 F (36.8 C)  TempSrc: Oral Oral  Oral  SpO2: 99% 94%  98%  Weight:      Height:        General: Pt is  alert, awake, not in acute distress Cardiovascular: RRR, S1/S2 +, no rubs, no gallops Respiratory: CTA bilaterally, no wheezing, no rhonchi Abdominal: Soft, NT, ND, bowel sounds + Extremities: no edema, no cyanosis    The results of significant diagnostics from this hospitalization (including imaging, microbiology, ancillary and laboratory) are listed below for reference.     Microbiology: No results found for this or any previous visit (from the past 240 hour(s)).   Labs: BNP (last 3 results) No results for input(s): BNP in the last 8760 hours. Basic Metabolic Panel: Recent Labs  Lab 06/17/18 1719 06/19/18 0740 06/20/18 0408 06/21/18 1407  NA 142 139 139 138  K 3.7 3.5 4.0 3.9  CL 105 104 109 105  CO2 '31 26 22 27  ' GLUCOSE 104* 128* 147* 140*  BUN '14 14 10 ' 6*  CREATININE 0.86 0.82 0.79 0.82  CALCIUM 9.6 9.2 8.4* 9.1   Liver Function Tests: Recent Labs  Lab 06/17/18 1719 06/19/18 0740  AST 13* 13*  ALT 13 14  ALKPHOS 72 66  BILITOT 0.5 0.6  PROT 7.0 7.5  ALBUMIN 3.8 4.0   Recent Labs  Lab 06/17/18 1719 06/19/18 0740  LIPASE 57* 48   No results for input(s): AMMONIA in the last 168 hours. CBC: Recent Labs  Lab 06/17/18 1719 06/19/18 0740  WBC 7.5 7.9  NEUTROABS  --  5.5  HGB 13.6 13.6  HCT 43.6 42.9  MCV 89.5 91.1  PLT 341 298   Cardiac Enzymes: No results for input(s): CKTOTAL, CKMB, CKMBINDEX, TROPONINI in the last 168 hours. BNP: Invalid input(s): POCBNP CBG: Recent Labs  Lab 06/22/18 1620 06/22/18 1953 06/22/18 2345 06/23/18 0408 06/23/18 0748  GLUCAP 153* 214* 145* 127* 140*   D-Dimer No results for input(s): DDIMER in the last 72 hours. Hgb A1c No results for input(s): HGBA1C in the last 72 hours. Lipid Profile Recent Labs    06/20/18 1908  CHOL 130  HDL 46  LDLCALC 73  TRIG 53  CHOLHDL 2.8   Thyroid function studies No results for input(s): TSH, T4TOTAL, T3FREE, THYROIDAB in the last 72 hours.  Invalid input(s):  FREET3 Anemia work up No results for input(s): VITAMINB12, FOLATE, FERRITIN, TIBC, IRON, RETICCTPCT in the last 72 hours. Urinalysis    Component Value Date/Time   COLORURINE YELLOW 06/19/2018 0741   APPEARANCEUR CLEAR 06/19/2018 0741   LABSPEC 1.033 (H) 06/19/2018 0741   PHURINE 5.0 06/19/2018 0741   GLUCOSEU >=500 (A) 06/19/2018 0741   HGBUR NEGATIVE 06/19/2018 0741   BILIRUBINUR NEGATIVE 06/19/2018 0741   KETONESUR 20 (A) 06/19/2018 0741   PROTEINUR NEGATIVE 06/19/2018 0741   UROBILINOGEN 1.0 11/12/2014 1944   NITRITE NEGATIVE 06/19/2018 0741   LEUKOCYTESUR NEGATIVE 06/19/2018 0741   Sepsis Labs Invalid input(s): PROCALCITONIN,  WBC,  Aneta Microbiology No results found for this or any previous visit (from the past 240 hour(s)).   Time coordinating discharge: 33  minutes  SIGNED:   Georgette Shell, MD  [facility] 06/23/2018, 11:41 AM Pager   If 7PM-7AM, please contact night-coverage [email or web address] Password [email or web address]

## 2018-06-23 NOTE — Progress Notes (Signed)
At 1211, the pt was provided with d/c instructions. After discussing the pt's plan of care upon d/c, the pt reported no further questions or concerns. The pt is awaiting their ride home at this time.

## 2018-06-24 ENCOUNTER — Other Ambulatory Visit: Payer: Self-pay | Admitting: Obstetrics and Gynecology

## 2018-06-24 DIAGNOSIS — Z1231 Encounter for screening mammogram for malignant neoplasm of breast: Secondary | ICD-10-CM

## 2018-07-14 ENCOUNTER — Other Ambulatory Visit: Payer: Self-pay | Admitting: Gastroenterology

## 2018-07-14 DIAGNOSIS — K8500 Idiopathic acute pancreatitis without necrosis or infection: Secondary | ICD-10-CM

## 2018-07-22 ENCOUNTER — Ambulatory Visit
Admission: RE | Admit: 2018-07-22 | Discharge: 2018-07-22 | Disposition: A | Discharge status: Home / Self Care | Payer: BC Managed Care – PPO | Source: Ambulatory Visit | Attending: Gastroenterology | Admitting: Gastroenterology

## 2018-07-22 DIAGNOSIS — K8500 Idiopathic acute pancreatitis without necrosis or infection: Secondary | ICD-10-CM

## 2018-07-22 MED ORDER — GADOBENATE DIMEGLUMINE 529 MG/ML IV SOLN
19.0000 mL | Freq: Once | INTRAVENOUS | Status: AC | PRN
  Administered 2018-07-22: 19 mL via INTRAVENOUS

## 2018-07-25 ENCOUNTER — Ambulatory Visit
Admission: RE | Admit: 2018-07-25 | Discharge: 2018-07-25 | Disposition: A | Discharge status: Home / Self Care | Payer: BC Managed Care – PPO | Source: Ambulatory Visit | Attending: Obstetrics and Gynecology | Admitting: Obstetrics and Gynecology

## 2018-07-25 DIAGNOSIS — Z1231 Encounter for screening mammogram for malignant neoplasm of breast: Secondary | ICD-10-CM

## 2018-08-30 ENCOUNTER — Ambulatory Visit: Payer: BC Managed Care – PPO | Admitting: Internal Medicine

## 2019-01-12 ENCOUNTER — Other Ambulatory Visit (INDEPENDENT_AMBULATORY_CARE_PROVIDER_SITE_OTHER): Payer: Self-pay | Admitting: Family Medicine

## 2019-01-12 DIAGNOSIS — E119 Type 2 diabetes mellitus without complications: Secondary | ICD-10-CM

## 2019-03-15 ENCOUNTER — Other Ambulatory Visit (INDEPENDENT_AMBULATORY_CARE_PROVIDER_SITE_OTHER): Payer: Self-pay | Admitting: Family Medicine

## 2019-03-15 DIAGNOSIS — E119 Type 2 diabetes mellitus without complications: Secondary | ICD-10-CM

## 2019-03-28 ENCOUNTER — Inpatient Hospital Stay (HOSPITAL_COMMUNITY)
Admission: EM | Admit: 2019-03-28 | Discharge: 2019-03-30 | DRG: 062 | Disposition: A | Discharge status: Home / Self Care | Payer: BC Managed Care – PPO | Attending: Neurology | Admitting: Neurology

## 2019-03-28 ENCOUNTER — Other Ambulatory Visit: Payer: Self-pay

## 2019-03-28 ENCOUNTER — Encounter (HOSPITAL_COMMUNITY): Payer: Self-pay

## 2019-03-28 ENCOUNTER — Emergency Department (HOSPITAL_COMMUNITY): Payer: BC Managed Care – PPO

## 2019-03-28 ENCOUNTER — Inpatient Hospital Stay (HOSPITAL_COMMUNITY): Payer: BC Managed Care – PPO

## 2019-03-28 DIAGNOSIS — Z888 Allergy status to other drugs, medicaments and biological substances status: Secondary | ICD-10-CM

## 2019-03-28 DIAGNOSIS — R2990 Unspecified symptoms and signs involving the nervous system: Secondary | ICD-10-CM | POA: Diagnosis present

## 2019-03-28 DIAGNOSIS — E785 Hyperlipidemia, unspecified: Secondary | ICD-10-CM | POA: Diagnosis present

## 2019-03-28 DIAGNOSIS — Z833 Family history of diabetes mellitus: Secondary | ICD-10-CM | POA: Diagnosis not present

## 2019-03-28 DIAGNOSIS — I6389 Other cerebral infarction: Secondary | ICD-10-CM | POA: Diagnosis not present

## 2019-03-28 DIAGNOSIS — G459 Transient cerebral ischemic attack, unspecified: Principal | ICD-10-CM | POA: Diagnosis present

## 2019-03-28 DIAGNOSIS — Z79899 Other long term (current) drug therapy: Secondary | ICD-10-CM

## 2019-03-28 DIAGNOSIS — Z6839 Body mass index (BMI) 39.0-39.9, adult: Secondary | ICD-10-CM

## 2019-03-28 DIAGNOSIS — H409 Unspecified glaucoma: Secondary | ICD-10-CM | POA: Diagnosis present

## 2019-03-28 DIAGNOSIS — Z7951 Long term (current) use of inhaled steroids: Secondary | ICD-10-CM

## 2019-03-28 DIAGNOSIS — Z86010 Personal history of colonic polyps: Secondary | ICD-10-CM | POA: Diagnosis not present

## 2019-03-28 DIAGNOSIS — I161 Hypertensive emergency: Secondary | ICD-10-CM | POA: Diagnosis present

## 2019-03-28 DIAGNOSIS — Z96641 Presence of right artificial hip joint: Secondary | ICD-10-CM | POA: Diagnosis present

## 2019-03-28 DIAGNOSIS — I10 Essential (primary) hypertension: Secondary | ICD-10-CM | POA: Diagnosis present

## 2019-03-28 DIAGNOSIS — Z20828 Contact with and (suspected) exposure to other viral communicable diseases: Secondary | ICD-10-CM | POA: Diagnosis present

## 2019-03-28 DIAGNOSIS — Z7984 Long term (current) use of oral hypoglycemic drugs: Secondary | ICD-10-CM | POA: Diagnosis not present

## 2019-03-28 DIAGNOSIS — Z9114 Patient's other noncompliance with medication regimen: Secondary | ICD-10-CM

## 2019-03-28 DIAGNOSIS — Z880 Allergy status to penicillin: Secondary | ICD-10-CM

## 2019-03-28 DIAGNOSIS — R402252 Coma scale, best verbal response, oriented, at arrival to emergency department: Secondary | ICD-10-CM | POA: Diagnosis present

## 2019-03-28 DIAGNOSIS — E041 Nontoxic single thyroid nodule: Secondary | ICD-10-CM | POA: Diagnosis present

## 2019-03-28 DIAGNOSIS — Z8249 Family history of ischemic heart disease and other diseases of the circulatory system: Secondary | ICD-10-CM

## 2019-03-28 DIAGNOSIS — R29702 NIHSS score 2: Secondary | ICD-10-CM | POA: Diagnosis present

## 2019-03-28 DIAGNOSIS — Z800 Family history of malignant neoplasm of digestive organs: Secondary | ICD-10-CM

## 2019-03-28 DIAGNOSIS — E119 Type 2 diabetes mellitus without complications: Secondary | ICD-10-CM

## 2019-03-28 DIAGNOSIS — J45909 Unspecified asthma, uncomplicated: Secondary | ICD-10-CM | POA: Diagnosis present

## 2019-03-28 DIAGNOSIS — I639 Cerebral infarction, unspecified: Secondary | ICD-10-CM | POA: Diagnosis present

## 2019-03-28 DIAGNOSIS — R402142 Coma scale, eyes open, spontaneous, at arrival to emergency department: Secondary | ICD-10-CM | POA: Diagnosis present

## 2019-03-28 DIAGNOSIS — R05 Cough: Secondary | ICD-10-CM | POA: Diagnosis present

## 2019-03-28 DIAGNOSIS — E1151 Type 2 diabetes mellitus with diabetic peripheral angiopathy without gangrene: Secondary | ICD-10-CM | POA: Diagnosis present

## 2019-03-28 DIAGNOSIS — R402362 Coma scale, best motor response, obeys commands, at arrival to emergency department: Secondary | ICD-10-CM | POA: Diagnosis present

## 2019-03-28 DIAGNOSIS — K219 Gastro-esophageal reflux disease without esophagitis: Secondary | ICD-10-CM | POA: Diagnosis present

## 2019-03-28 DIAGNOSIS — Z87891 Personal history of nicotine dependence: Secondary | ICD-10-CM

## 2019-03-28 DIAGNOSIS — E1159 Type 2 diabetes mellitus with other circulatory complications: Secondary | ICD-10-CM | POA: Diagnosis not present

## 2019-03-28 DIAGNOSIS — M1990 Unspecified osteoarthritis, unspecified site: Secondary | ICD-10-CM | POA: Diagnosis present

## 2019-03-28 LAB — ETHANOL: Alcohol, Ethyl (B): <10 mg/dL (ref <10)

## 2019-03-28 LAB — URINALYSIS, ROUTINE W REFLEX MICROSCOPIC
Bilirubin Urine: NEGATIVE
Glucose, UA: NEGATIVE mg/dL
Ketones, ur: NEGATIVE mg/dL
Leukocytes,Ua: NEGATIVE
Nitrite: NEGATIVE
Protein, ur: NEGATIVE mg/dL
Specific Gravity, Urine: 1.024 (ref 1.005–1.030)
pH: 6 (ref 5.0–8.0)

## 2019-03-28 LAB — COMPREHENSIVE METABOLIC PANEL
ALT: 22 U/L (ref 0–44)
AST: 19 U/L (ref 15–41)
Albumin: 3.9 g/dL (ref 3.5–5.0)
Alkaline Phosphatase: 86 U/L (ref 38–126)
Anion gap: 11 (ref 5–15)
BUN: 13 mg/dL (ref 8–23)
CO2: 24 mmol/L (ref 22–32)
Calcium: 9.5 mg/dL (ref 8.9–10.3)
Chloride: 104 mmol/L (ref 98–111)
Creatinine, Ser: 1.01 mg/dL — ABNORMAL HIGH (ref 0.44–1.00)
GFR calc Af Amer: >60 mL/min (ref >60)
GFR calc non Af Amer: >60 mL/min (ref >60)
Glucose, Bld: 157 mg/dL — ABNORMAL HIGH (ref 70–99)
Potassium: 3.6 mmol/L (ref 3.5–5.1)
Sodium: 139 mmol/L (ref 135–145)
Total Bilirubin: 0.7 mg/dL (ref 0.3–1.2)
Total Protein: 7.1 g/dL (ref 6.5–8.1)

## 2019-03-28 LAB — RAPID URINE DRUG SCREEN, HOSP PERFORMED
Amphetamines: NOT DETECTED
Barbiturates: NOT DETECTED
Benzodiazepines: NOT DETECTED
Cocaine: NOT DETECTED
Opiates: NOT DETECTED
Tetrahydrocannabinol: NOT DETECTED

## 2019-03-28 LAB — I-STAT CHEM 8, ED
BUN: 18 mg/dL (ref 8–23)
Calcium, Ion: 1.08 mmol/L — ABNORMAL LOW (ref 1.15–1.40)
Chloride: 105 mmol/L (ref 98–111)
Creatinine, Ser: 0.9 mg/dL (ref 0.44–1.00)
Glucose, Bld: 153 mg/dL — ABNORMAL HIGH (ref 70–99)
HCT: 43 % (ref 36.0–46.0)
Hemoglobin: 14.6 g/dL (ref 12.0–15.0)
Potassium: 4.8 mmol/L (ref 3.5–5.1)
Sodium: 139 mmol/L (ref 135–145)
TCO2: 26 mmol/L (ref 22–32)

## 2019-03-28 LAB — PROTIME-INR
INR: 1 (ref 0.8–1.2)
Prothrombin Time: 13.4 seconds (ref 11.4–15.2)

## 2019-03-28 LAB — DIFFERENTIAL
Abs Immature Granulocytes: 0.01 10*3/uL (ref 0.00–0.07)
Basophils Absolute: 0.1 10*3/uL (ref 0.0–0.1)
Basophils Relative: 1 %
Eosinophils Absolute: 0.4 10*3/uL (ref 0.0–0.5)
Eosinophils Relative: 5 %
Immature Granulocytes: 0 %
Lymphocytes Relative: 48 %
Lymphs Abs: 3.5 10*3/uL (ref 0.7–4.0)
Monocytes Absolute: 0.6 10*3/uL (ref 0.1–1.0)
Monocytes Relative: 8 %
Neutro Abs: 2.8 10*3/uL (ref 1.7–7.7)
Neutrophils Relative %: 38 %

## 2019-03-28 LAB — CBC
HCT: 42.2 % (ref 36.0–46.0)
Hemoglobin: 14.3 g/dL (ref 12.0–15.0)
MCH: 30.6 pg (ref 26.0–34.0)
MCHC: 33.9 g/dL (ref 30.0–36.0)
MCV: 90.2 fL (ref 80.0–100.0)
Platelets: 263 10*3/uL (ref 150–400)
RBC: 4.68 MIL/uL (ref 3.87–5.11)
RDW: 13.2 % (ref 11.5–15.5)
WBC: 7.4 10*3/uL (ref 4.0–10.5)
nRBC: 0 % (ref 0.0–0.2)

## 2019-03-28 LAB — SARS CORONAVIRUS 2 BY RT PCR (HOSPITAL ORDER, PERFORMED IN ~~LOC~~ HOSPITAL LAB): SARS Coronavirus 2: NEGATIVE

## 2019-03-28 LAB — ECHOCARDIOGRAM COMPLETE
Height: 62 in
Weight: 3474.45 oz

## 2019-03-28 LAB — MRSA PCR SCREENING: MRSA by PCR: NEGATIVE

## 2019-03-28 LAB — CBG MONITORING, ED: Glucose-Capillary: 146 mg/dL — ABNORMAL HIGH (ref 70–99)

## 2019-03-28 LAB — APTT: aPTT: 28 seconds (ref 24–36)

## 2019-03-28 MED ORDER — IOHEXOL 350 MG/ML SOLN
80.0000 mL | Freq: Once | INTRAVENOUS | Status: AC | PRN
  Administered 2019-03-28: 75 mL via INTRAVENOUS

## 2019-03-28 MED ORDER — LABETALOL HCL 5 MG/ML IV SOLN: 20.0000 mg | Freq: Once | INTRAVENOUS | Status: DC

## 2019-03-28 MED ORDER — ALTEPLASE (STROKE) FULL DOSE INFUSION
0.9000 mg/kg | Freq: Once | INTRAVENOUS | Status: AC
  Administered 2019-03-28: 88.7 mg via INTRAVENOUS
  Filled 2019-03-28: qty 100

## 2019-03-28 MED ORDER — ACETAMINOPHEN 160 MG/5ML PO SOLN: 650.0000 mg | ORAL | Status: DC | PRN

## 2019-03-28 MED ORDER — STROKE: EARLY STAGES OF RECOVERY BOOK
Freq: Once | Status: AC
  Administered 2019-03-28: 22:00:00

## 2019-03-28 MED ORDER — SODIUM CHLORIDE 0.9 % IV SOLN
INTRAVENOUS | Status: DC
  Administered 2019-03-28 – 2019-03-29 (×2): via INTRAVENOUS

## 2019-03-28 MED ORDER — ACETAMINOPHEN 650 MG RE SUPP: 650.0000 mg | RECTAL | Status: DC | PRN

## 2019-03-28 MED ORDER — ACETAMINOPHEN 325 MG PO TABS
650.0000 mg | ORAL_TABLET | ORAL | Status: DC | PRN
  Administered 2019-03-29 – 2019-03-30 (×3): 650 mg via ORAL
  Filled 2019-03-28 (×3): qty 2

## 2019-03-28 MED ORDER — PANTOPRAZOLE SODIUM 40 MG IV SOLR
40.0000 mg | Freq: Every day | INTRAVENOUS | Status: DC
  Administered 2019-03-28: 40 mg via INTRAVENOUS
  Filled 2019-03-28: qty 40

## 2019-03-28 MED ORDER — CHLORHEXIDINE GLUCONATE CLOTH 2 % EX PADS
6.0000 | MEDICATED_PAD | Freq: Every day | CUTANEOUS | Status: DC
  Administered 2019-03-28 – 2019-03-29 (×2): 6 via TOPICAL

## 2019-03-28 MED ORDER — SODIUM CHLORIDE 0.9 % IV SOLN
50.0000 mL | Freq: Once | INTRAVENOUS | Status: AC
  Administered 2019-03-28: 50 mL via INTRAVENOUS

## 2019-03-28 MED ORDER — SENNOSIDES-DOCUSATE SODIUM 8.6-50 MG PO TABS: 1.0000 | ORAL_TABLET | Freq: Every evening | ORAL | Status: DC | PRN

## 2019-03-28 MED ORDER — CLEVIDIPINE BUTYRATE 0.5 MG/ML IV EMUL: 0.0000 mg/h | INTRAVENOUS | Status: DC

## 2019-03-28 NOTE — Progress Notes (Signed)
  Echocardiogram 2D Echocardiogram has been performed.  [name] 03/28/2019, 4:01 PM

## 2019-03-28 NOTE — ED Notes (Signed)
ED TO INPATIENT HANDOFF REPORT  ED Nurse Name and Phone #: Annie Main [phone]  S Name/Age/Gender [name] 63 y.o. female Room/Bed: 020C/020C  Code Status   Code Status: Full Code  Home/SNF/Other Home Patient oriented to: self, place, time and situation Is this baseline? Yes   Triage Complete: Triage complete  Chief Complaint code stroke  Triage Note Pt brought via [facility] from home for c/o left facial numbness and left arm heaviness. LKW 1230 today. Pt is A&Ox4. PERRLA. Pt is NSR on monitor   Allergies Allergies  Allergen Reactions  . Diclofenac     Hives   . Penicillins Hives    Has patient had a PCN reaction causing immediate rash, facial/tongue/throat swelling, SOB or lightheadedness with hypotension: Yes Has patient had a PCN reaction causing severe rash involving mucus membranes or skin necrosis: Yes Has patient had a PCN reaction that required hospitalization No Has patient had a PCN reaction occurring within the last 10 years: No. If all of the above answers are "NO", then may proceed with Cephalosporin use.     Level of Care/Admitting Diagnosis ED Disposition    ED Disposition Condition Comment   Admit  Hospital Area: [place] [[id number]]  Level of Care: ICU [6]  Covid Evaluation: Asymptomatic Screening Protocol (No Symptoms)  Diagnosis: Acute ischemic stroke [facility]) [id number]  Admitting Physician: Amie Portland [id number]  Attending Physician: Amie Portland [id number]  Estimated length of stay: 3 - 4 days  Certification:: I certify this patient will need inpatient services for at least 2 midnights  PT Class (Do Not Modify): Inpatient [101]  PT Acc Code (Do Not Modify): Private [1]       B Medical/Surgery History Past Medical History:  Diagnosis Date  . Anemia 2006   required transfusion post TAH/BSO 05/2005  . Arthritis   . Asthma   . Chronic cough   . Colon polyps    hyperplastic  . Diabetes mellitus without complication ([place])    . Diverticulosis   . GERD (gastroesophageal reflux disease)    on prilosec  . GLA deficiency ([facility])   . Glaucoma of both eyes   . Hypertension   . Joint pain   . Osteoarthritis   . Pancreatitis   . Shortness of breath dyspnea    Past Surgical History:  Procedure Laterality Date  . ABDOMINAL HYSTERECTOMY    . BREAST EXCISIONAL BIOPSY Right 1990  . COLONOSCOPY N/A 08/22/2013   Procedure: COLONOSCOPY;  Surgeon: Irene Shipper, MD;  Location: [facility] ENDOSCOPY;  Service: Endoscopy;  Laterality: N/A;  . ESOPHAGOGASTRODUODENOSCOPY N/A 08/22/2013   Procedure: ESOPHAGOGASTRODUODENOSCOPY (EGD);  Surgeon: Irene Shipper, MD;  Location: [facility] ENDOSCOPY;  Service: Endoscopy;  Laterality: N/A;  . ESOPHAGOGASTRODUODENOSCOPY (EGD) WITH PROPOFOL N/A 08/19/2015   Procedure: ESOPHAGOGASTRODUODENOSCOPY (EGD) WITH PROPOFOL;  Surgeon: Irene Shipper, MD;  Location: [facility] ENDOSCOPY;  Service: Endoscopy;  Laterality: N/A;  . HERNIA REPAIR    . HYSTEROSCOPY W/D&C  01/2005   for uterine fibroids.   . INSERTION OF MESH N/A 08/24/2013   Procedure: INSERTION OF MESH;  Surgeon: Edward Jolly, MD;  Location: [place];  Service: General;  Laterality: N/A;  . JOINT REPLACEMENT Right   . LIPOMA EXCISION Left 06/21/2015   Procedure: EXCISION OF LEFT SCALP LIPOMA;  Surgeon: Johnathan Hausen, MD;  Location: [place];  Service: General;  Laterality: Left;  . PANNICULECTOMY N/A 08/24/2013   Procedure: PANNICULECTOMY;  Surgeon: Edward Jolly, MD;  Location: [place];  Service: General;  Laterality: N/A;  . TOTAL ABDOMINAL HYSTERECTOMY W/ BILATERAL SALPINGOOPHORECTOMY  05/2005  . TOTAL HIP ARTHROPLASTY Right 12/12/2013   Procedure: RIGHT TOTAL HIP ARTHROPLASTY ANTERIOR APPROACH;  Surgeon: Mcarthur Rossetti, MD;  Location: [place];  Service: Orthopedics;  Laterality: Right;  . VENTRAL HERNIA REPAIR N/A 08/24/2013   Procedure: HERNIA REPAIR VENTRAL ADULT;  Surgeon: Edward Jolly, MD;  Location: [place];  Service: General;   Laterality: N/A;     A IV Location/Drains/Wounds Patient Lines/Drains/Airways Status   Active Line/Drains/Airways    Name:   Placement date:   Placement time:   Site:   Days:   Peripheral IV 03/28/19 Right Antecubital   03/28/19    -    Antecubital   less than 1   Peripheral IV 03/28/19 Left Antecubital   03/28/19    -    Antecubital   less than 1   External Urinary Catheter   03/28/19    1442    -   less than 1          Intake/Output Last 24 hours  Intake/Output Summary (Last 24 hours) at 03/28/2019 1905 Last data filed at 03/28/2019 1508 Gross per 24 hour  Intake 150 ml  Output -  Net 150 ml    Labs/Imaging Results for orders placed or performed during the hospital encounter of 03/28/19 (from the past 48 hour(s))  CBG monitoring, ED     Status: Abnormal   Collection Time: 03/28/19  1:23 PM  Result Value Ref Range   Glucose-Capillary 146 (H) 70 - 99 mg/dL  Ethanol     Status: None   Collection Time: 03/28/19  1:26 PM  Result Value Ref Range   Alcohol, Ethyl (B) <10 <10 mg/dL    Comment: (NOTE) Lowest detectable limit for serum alcohol is 10 mg/dL. For medical purposes only. Performed at [facility], [place] [address] [place], [place] [address]   Protime-INR     Status: None   Collection Time: 03/28/19  1:26 PM  Result Value Ref Range   Prothrombin Time 13.4 11.4 - 15.2 seconds   INR 1.0 0.8 - 1.2    Comment: (NOTE) INR goal varies based on device and disease states. Performed at [facility], [place] [address] [place], [place] [address]   APTT     Status: None   Collection Time: 03/28/19  1:26 PM  Result Value Ref Range   aPTT 28 24 - 36 seconds    Comment: Performed at [place] [address], [place] [address]  CBC     Status: None   Collection Time: 03/28/19  1:26 PM  Result Value Ref Range   WBC 7.4 4.0 - 10.5 K/uL   RBC 4.68 3.87 - 5.11 MIL/uL   Hemoglobin 14.3 12.0 - 15.0 g/dL   HCT 42.2 36.0 - 46.0 %   MCV 90.2 80.0 -  100.0 fL   MCH 30.6 26.0 - 34.0 pg   MCHC 33.9 30.0 - 36.0 g/dL   RDW 13.2 11.5 - 15.5 %   Platelets 263 150 - 400 K/uL   nRBC 0.0 0.0 - 0.2 %    Comment: Performed at [facility], [place] [address], [place], [place] [address]  Differential     Status: None   Collection Time: 03/28/19  1:26 PM  Result Value Ref Range   Neutrophils Relative % 38 %   Neutro Abs 2.8 1.7 - 7.7  K/uL   Lymphocytes Relative 48 %   Lymphs Abs 3.5 0.7 - 4.0 K/uL   Monocytes Relative 8 %   Monocytes Absolute 0.6 0.1 - 1.0 K/uL   Eosinophils Relative 5 %   Eosinophils Absolute 0.4 0.0 - 0.5 K/uL   Basophils Relative 1 %   Basophils Absolute 0.1 0.0 - 0.1 K/uL   Immature Granulocytes 0 %   Abs Immature Granulocytes 0.01 0.00 - 0.07 K/uL    Comment: Performed at [place] [address] [place], [place] [address]  Comprehensive metabolic panel     Status: Abnormal   Collection Time: 03/28/19  1:26 PM  Result Value Ref Range   Sodium 139 135 - 145 mmol/L   Potassium 3.6 3.5 - 5.1 mmol/L   Chloride 104 98 - 111 mmol/L   CO2 24 22 - 32 mmol/L   Glucose, Bld 157 (H) 70 - 99 mg/dL   BUN 13 8 - 23 mg/dL   Creatinine, Ser 1.01 (H) 0.44 - 1.00 mg/dL   Calcium 9.5 8.9 - 10.3 mg/dL   Total Protein 7.1 6.5 - 8.1 g/dL   Albumin 3.9 3.5 - 5.0 g/dL   AST 19 15 - 41 U/L   ALT 22 0 - 44 U/L   Alkaline Phosphatase 86 38 - 126 U/L   Total Bilirubin 0.7 0.3 - 1.2 mg/dL   GFR calc non Af Amer >60 >60 mL/min   GFR calc Af Amer >60 >60 mL/min   Anion gap 11 5 - 15    Comment: Performed at [facility] [address], [place], [place] [address]  I-stat chem 8, ED     Status: Abnormal   Collection Time: 03/28/19  1:28 PM  Result Value Ref Range   Sodium 139 135 - 145 mmol/L   Potassium 4.8 3.5 - 5.1 mmol/L   Chloride 105 98 - 111 mmol/L   BUN 18 8 - 23 mg/dL   Creatinine, Ser 0.90 0.44 - 1.00 mg/dL   Glucose, Bld 153 (H) 70 - 99 mg/dL   Calcium, Ion 1.08 (L) 1.15 - 1.40 mmol/L   TCO2 26 22 - 32  mmol/L   Hemoglobin 14.6 12.0 - 15.0 g/dL   HCT 43.0 36.0 - 46.0 %  SARS Coronavirus 2 [facility] order, Performed in [facility] hospital lab) Nasopharyngeal Nasopharyngeal Swab     Status: None   Collection Time: 03/28/19  2:55 PM   Specimen: Nasopharyngeal Swab  Result Value Ref Range   SARS Coronavirus 2 NEGATIVE NEGATIVE    Comment: (NOTE) If result is NEGATIVE SARS-CoV-2 target nucleic acids are NOT DETECTED. The SARS-CoV-2 RNA is generally detectable in upper and lower  respiratory specimens during the acute phase of infection. The lowest  concentration of SARS-CoV-2 viral copies this assay can detect is 250  copies / mL. A negative result does not preclude SARS-CoV-2 infection  and should not be used as the sole basis for treatment or other  patient management decisions.  A negative result may occur with  improper specimen collection / handling, submission of specimen other  than nasopharyngeal swab, presence of viral mutation(s) within the  areas targeted by this assay, and inadequate number of viral copies  (<250 copies / mL). A negative result must be combined with clinical  observations, patient history, and epidemiological information. If result is POSITIVE SARS-CoV-2 target nucleic acids are DETECTED. The SARS-CoV-2 RNA is generally detectable in upper and lower  respiratory specimens dur ing the  acute phase of infection.  Positive  results are indicative of active infection with SARS-CoV-2.  Clinical  correlation with patient history and other diagnostic information is  necessary to determine patient infection status.  Positive results do  not rule out bacterial infection or co-infection with other viruses. If result is PRESUMPTIVE POSTIVE SARS-CoV-2 nucleic acids MAY BE PRESENT.   A presumptive positive result was obtained on the submitted specimen  and confirmed on repeat testing.  While 2019 novel coronavirus  (SARS-CoV-2) nucleic acids may be present in the  submitted sample  additional confirmatory testing may be necessary for epidemiological  and / or clinical management purposes  to differentiate between  SARS-CoV-2 and other Sarbecovirus currently known to infect humans.  If clinically indicated additional testing with an alternate test  methodology [phone]) is advised. The SARS-CoV-2 RNA is generally  detectable in upper and lower respiratory sp ecimens during the acute  phase of infection. The expected result is Negative. Fact Sheet for Patients:  StrictlyIdeas.no Fact Sheet for Healthcare Providers: BankingDealers.co.za This test is not yet approved or cleared by the [place] FDA and has been authorized for detection and/or diagnosis of SARS-CoV-2 by FDA under an Emergency Use Authorization (EUA).  This EUA will remain in effect (meaning this test can be used) for the duration of the COVID-19 declaration under Section 564(b)(1) of the Act, 21 U.S.C. section 360bbb-3(b)(1), unless the authorization is terminated or revoked sooner. Performed at [facility], [place] [address] [place], [place] [address]   Urine rapid drug screen (hosp performed)     Status: None   Collection Time: 03/28/19  3:15 PM  Result Value Ref Range   Opiates NONE DETECTED NONE DETECTED   Cocaine NONE DETECTED NONE DETECTED   Benzodiazepines NONE DETECTED NONE DETECTED   Amphetamines NONE DETECTED NONE DETECTED   Tetrahydrocannabinol NONE DETECTED NONE DETECTED   Barbiturates NONE DETECTED NONE DETECTED    Comment: (NOTE) DRUG SCREEN FOR MEDICAL PURPOSES ONLY.  IF CONFIRMATION IS NEEDED FOR ANY PURPOSE, NOTIFY LAB WITHIN 5 DAYS. LOWEST DETECTABLE LIMITS FOR URINE DRUG SCREEN Drug Class                     Cutoff (ng/mL) Amphetamine and metabolites    1000 Barbiturate and metabolites    200 Benzodiazepine                 [id number] Tricyclics and metabolites     300 Opiates and metabolites         300 Cocaine and metabolites        300 THC                            50 Performed at [facility], [place] [address] [place],  [address]   Urinalysis, Routine w reflex microscopic     Status: Abnormal   Collection Time: 03/28/19  3:15 PM  Result Value Ref Range   Color, Urine STRAW (A) YELLOW   APPearance CLEAR CLEAR   Specific Gravity, Urine 1.024 1.005 - 1.030   pH 6.0 5.0 - 8.0   Glucose, UA NEGATIVE NEGATIVE mg/dL   Hgb urine dipstick SMALL (A) NEGATIVE   Bilirubin Urine NEGATIVE NEGATIVE   Ketones, ur NEGATIVE NEGATIVE mg/dL   Protein, ur NEGATIVE NEGATIVE mg/dL   Nitrite NEGATIVE NEGATIVE   Leukocytes,Ua NEGATIVE NEGATIVE   RBC / HPF 0-5 0 - 5 RBC/hpf  WBC, UA 0-5 0 - 5 WBC/hpf   Bacteria, UA RARE (A) NONE SEEN   Squamous Epithelial / LPF 0-5 0 - 5    Comment: Performed at [facility], [place] [address], [place], [place] [address]   Ct Angio Head W Or Wo Contrast  Result Date: 03/28/2019 CLINICAL DATA:  Focal neuro deficit. Left facial numbness. Left arm heaviness. EXAM: CT ANGIOGRAPHY HEAD AND NECK TECHNIQUE: Multidetector CT imaging of the head and neck was performed using the standard protocol during bolus administration of intravenous contrast. Multiplanar CT image reconstructions and MIPs were obtained to evaluate the vascular anatomy. Carotid stenosis measurements (when applicable) are obtained utilizing NASCET criteria, using the distal internal carotid diameter as the denominator. CONTRAST:  55mL OMNIPAQUE IOHEXOL 350 MG/ML SOLN COMPARISON:  CT head 03/28/2019 FINDINGS: CTA NECK FINDINGS Aortic arch: Bovine arch. Proximal great vessels widely patent. Normal aortic arch. Right carotid system: Normal right carotid. Negative for stenosis or dissection Left carotid system: Normal left carotid. Negative for stenosis or dissection. Vertebral arteries: Hypoplastic posterior circulation due to fetal origin of the posterior cerebral arteries bilaterally. Small  vertebral arteries bilaterally, both patent to the basilar without significant disease. Skeleton: No acute skeletal abnormality. Other neck: Incidental left thyroid nodule measuring 21 x 30 mm. Upper chest: Lung apices clear bilaterally. Review of the MIP images confirms the above findings CTA HEAD FINDINGS Anterior circulation: Cavernous carotid widely patent bilaterally. Anterior and middle cerebral arteries patent bilaterally without stenosis or large vessel occlusion. Posterior circulation: Hypoplastic posterior circulation. Right vertebral artery dominant and patent to the basilar. Small left vertebral artery with atherosclerotic plaque and moderate to severe stenosis at the skull base. Minimal contribution to the basilar. Small basilar which supplies posterior cerebral arteries bilaterally. Fetal origin of the posterior cerebral artery bilaterally is without significant stenosis. Venous sinuses: Normal venous enhancement Anatomic variants: None. Review of the MIP images confirms the above findings IMPRESSION: 1. No significant carotid   stenosis in the neck. 2. 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 Right vertebral artery dominant and widely patent. 3. Negative for intracranial large vessel occlusion. 4. Incidental thyroid nodule 21 x 30 mm. Recommend thyroid ultrasound and consideration of biopsy to rule out neoplasm. Electronically Signed   By: Franchot Gallo M.D.   On: 03/28/2019 14:01   Ct Angio Neck W Or Wo Contrast  Result Date: 03/28/2019 CLINICAL DATA:  Focal neuro deficit. Left facial numbness. Left arm heaviness. EXAM: CT ANGIOGRAPHY HEAD AND NECK TECHNIQUE: Multidetector CT imaging of the head and neck was performed using the standard protocol during bolus administration of intravenous contrast. Multiplanar CT image reconstructions and MIPs were obtained to evaluate the vascular anatomy. Carotid stenosis measurements (when applicable) are obtained  utilizing NASCET criteria, using the distal internal carotid diameter as the denominator. CONTRAST:  84mL OMNIPAQUE IOHEXOL 350 MG/ML SOLN COMPARISON:  CT head 03/28/2019 FINDINGS: CTA NECK FINDINGS Aortic arch: Bovine arch. Proximal great vessels widely patent. Normal aortic arch. Right carotid system: Normal right carotid. Negative for stenosis or dissection Left carotid system: Normal left carotid. Negative for stenosis or dissection. Vertebral arteries: Hypoplastic posterior circulation due to fetal origin of the posterior cerebral arteries bilaterally. Small vertebral arteries bilaterally, both patent to the basilar without significant disease. Skeleton: No acute skeletal abnormality. Other neck: Incidental left thyroid nodule measuring 21 x 30 mm. Upper chest: Lung apices clear bilaterally. Review of the MIP images confirms the above findings CTA HEAD FINDINGS  Anterior circulation: Cavernous carotid widely patent bilaterally. Anterior and middle cerebral arteries patent bilaterally without stenosis or large vessel occlusion. Posterior circulation: Hypoplastic posterior circulation. Right vertebral artery dominant and patent to the basilar. Small left vertebral artery with atherosclerotic plaque and moderate to severe stenosis at the skull base. Minimal contribution to the basilar. Small basilar which supplies posterior cerebral arteries bilaterally. Fetal origin of the posterior cerebral artery bilaterally is without significant stenosis. Venous sinuses: Normal venous enhancement Anatomic variants: None. Review of the MIP images confirms the above findings IMPRESSION: 1. No significant carotid   stenosis in the neck. 2. 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 Right vertebral artery dominant and widely patent. 3. Negative for intracranial large vessel occlusion. 4. Incidental thyroid nodule 21 x 30 mm. Recommend thyroid ultrasound and consideration of biopsy to rule out  neoplasm. Electronically Signed   By: Franchot Gallo M.D.   On: 03/28/2019 14:01   Ct Head Code Stroke Wo Contrast  Result Date: 03/28/2019 CLINICAL DATA:  Code stroke. Focal neuro deficit less than 6 hours. Left facial numbness. Left arm weakness. EXAM: CT HEAD WITHOUT CONTRAST TECHNIQUE: Contiguous axial images were obtained from the base of the skull through the vertex without intravenous contrast. COMPARISON:  None. FINDINGS: Brain: No evidence of acute infarction, hemorrhage, hydrocephalus, extra-axial collection or mass lesion/mass effect. Vascular: Negative for hyperdense vessel Skull: Negative Sinuses/Orbits: Negative Other: None ASPECTS ([place] Stroke Program Early CT Score) - Ganglionic level infarction (caudate, lentiform nuclei, internal capsule, insula, M1-M3 cortex): 7 - Supraganglionic infarction (M4-M6 cortex): 3 Total score (0-10 with 10 being normal): 10 IMPRESSION: 1. Negative CT head 2. ASPECTS is 10 Electronically Signed   By: Franchot Gallo M.D.   On: 03/28/2019 13:33    Pending Labs Unresulted Labs (From admission, onward)    Start     Ordered   03/29/19 0500  Hemoglobin A1c  Tomorrow morning,   R     03/28/19 1335   03/29/19 0500  Lipid panel  Tomorrow morning,   R    Comments: Fasting    03/28/19 1335          Vitals/Pain Today's Vitals   03/28/19 1815 03/28/19 1830 03/28/19 1845 03/28/19 1900  BP: 126/76 132/83 140/79 116/75  Pulse: 62 61 62 63  Resp: 12 15 15 14   Temp:      TempSrc:      SpO2: 100% 100% 100% 100%  Weight:      Height:      PainSc:        Isolation Precautions Airborne and Contact precautions  Medications Medications   stroke: mapping our early stages of recovery book (has no administration in time range)  0.9 %  sodium chloride infusion ( Intravenous New Bag/Given 03/28/19 1509)  acetaminophen (TYLENOL) tablet 650 mg (has no administration in time range)    Or  acetaminophen (TYLENOL) solution 650 mg (has no administration in  time range)    Or  acetaminophen (TYLENOL) suppository 650 mg (has no administration in time range)  senna-docusate (Senokot-S) tablet 1 tablet (has no administration in time range)  pantoprazole (PROTONIX) injection 40 mg (has no administration in time range)  labetalol (NORMODYNE) injection 20 mg (0 mg Intravenous Hold 03/28/19 1722)    And  clevidipine (CLEVIPREX) infusion 0.5 mg/mL (0 mg/hr Intravenous Hold 03/28/19 1722)  alteplase (ACTIVASE) 1 mg/mL infusion 88.7 mg (0 mg/kg  98.5 kg Intravenous Stopped 03/28/19 1507)    Followed  by  0.9 %  sodium chloride infusion (0 mLs Intravenous Stopped 03/28/19 1508)  iohexol (OMNIPAQUE) 350 MG/ML injection 80 mL (75 mLs Intravenous Contrast Given 03/28/19 1351)    Mobility walks Low fall risk   Focused Assessments Neuro Assessment Handoff:  Swallow screen pass? Yes  Cardiac Rhythm: Normal sinus rhythm NIH Stroke Scale ( + Modified Stroke Scale Criteria)  Interval: Initial Level of Consciousness (1a.)   : Alert, keenly responsive LOC Questions (1b. )   +: Answers both questions correctly LOC Commands (1c. )   + : Performs both tasks correctly Best Gaze (2. )  +: Normal Visual (3. )  +: No visual loss Facial Palsy (4. )    : Normal symmetrical movements Motor Arm, Left (5a. )   +: No drift Motor Arm, Right (5b. )   +: No drift Motor Leg, Left (6a. )   +: No drift Motor Leg, Right (6b. )   +: No drift Limb Ataxia (7. ): Absent Sensory (8. )   +: Normal, no sensory loss Best Language (9. )   +: No aphasia Dysarthria (10. ): Normal Extinction/Inattention (11.)   +: No Abnormality Modified SS Total  +: 0 Complete NIHSS TOTAL: 1 Last date known well: 03/28/19 Last time known well: 1230 Neuro Assessment: Within Defined Limits Neuro Checks:   Initial (03/28/19 1316)  Last Documented NIHSS Modified Score: 0 (03/28/19 1703) Has TPA been given? Yes Temp: 98.1 F (36.7 C) (10/06 1400) Temp Source: Oral (10/06 1400) BP: 116/75 (10/06  1900) Pulse Rate: 63 (10/06 1900) If patient is a Neuro Trauma and patient is going to OR before floor call report to [place] nurse: [phone] or [phone]     R Recommendations: See Admitting Provider Note  Report given to:   Additional Notes:

## 2019-03-28 NOTE — Consult Note (Deleted)
Stroke admission history and physical   CC: Left-sided numbness and heaviness  History is obtained from: Patient, chart  HPI: [name] is a 63 y.o. female past medical history of hypertension currently not on medications due to suffering side effects from lisinopril that made her blood pressures go way down, diabetes, presented to the emergency room for sudden onset of left-sided numbness and heaviness. 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  She was at work with last known normal at noon when she had sudden onset of left face arm numbness and feeling of subjective heaviness. EMS was called.  Her blood pressure-systolic was in 888B.  Her blood sugar was 222. They noted no deficits other than subjective sensory difference. Due to the sudden onset of focal neurological deficit, they activated an acute code stroke and patient was seen and examined in the emergency room. She denied having such symptoms in the past. She denied any recent illnesses or sicknesses.  No fevers chills. She denied a headache.  She denies any visual changes.  She denied diplopia. Denied chest pain shortness of breath nausea vomiting fevers chills loss of appetite diarrhea constipation.   LKW: 12 PM on 03/28/2019  tpa given?: Yes remorbid modified Rankin scale (mRS): 0  ROS: ROS was performed and is negative except as noted in the HPI.   Past Medical History:  Diagnosis Date  . Anemia 2006   required transfusion post TAH/BSO 05/2005  . Arthritis   . Asthma   . Chronic cough   . Colon polyps    hyperplastic  . Diabetes mellitus without complication ([place])   . Diverticulosis   . GERD (gastroesophageal reflux disease)    on prilosec  . GLA deficiency ([place])   . Glaucoma of both eyes   . Hypertension   . Joint pain   . Osteoarthritis   . Pancreatitis   . Shortness of breath dyspnea    Family History  Problem Relation Age of Onset  . Emphysema Mother        smoked  . Diabetes  Mother   . Hypertension Mother   . Colon cancer Father        7-s  . Hypertension Father   . Heart disease Father   . Breast cancer Sister    Social History:   reports that she quit smoking about 40 years ago. Her smoking use included cigarettes. She has a 3.75 pack-year smoking history. She has never used smokeless tobacco. She reports that she does not drink alcohol or use drugs.  Medications No current facility-administered medications for this encounter.   Current Outpatient Medications:  .  blood glucose meter kit and supplies KIT, Dispense based on patient and insurance preference. Use up to four times daily as directed. (FOR ICD-9 250.00, 250.01)., Disp: 1 each, Rfl: 0 .  budesonide-formoterol (SYMBICORT) 80-4.5 MCG/ACT inhaler, Take 2 puffs first thing in am and then another 2 puffs about 12 hours later. (Patient taking differently: Inhale 2 puffs into the lungs 2 (two) times daily as needed (wheezing or shortness of breath). ), Disp: 1 Inhaler, Rfl: 11 .  glimepiride (AMARYL) 4 MG tablet, Take 0.5 tablets (2 mg total) by mouth daily with breakfast., Disp: 30 tablet, Rfl: 0 .  glucose blood test strip, Use as instructed, Disp: 100 each, Rfl: 1 .  HYDROcodone-acetaminophen (NORCO/VICODIN) 5-325 MG tablet, Take 1 tablet by mouth every 4 (four) hours as needed. (Patient taking differently: Take 1 tablet by mouth  every 4 (four) hours as needed for moderate pain. ), Disp: 10 tablet, Rfl: 0 .  MELATONIN PO, Take 1 tablet by mouth at bedtime as needed (sleep). , Disp: , Rfl:  .  omeprazole (PRILOSEC) 40 MG capsule, Take 40 mg by mouth daily. , Disp: , Rfl:  .  ondansetron (ZOFRAN ODT) 4 MG disintegrating tablet, Take 1 tablet (4 mg total) by mouth every 8 (eight) hours as needed. (Patient taking differently: Take 4 mg by mouth every 8 (eight) hours as needed for nausea or vomiting. ), Disp: 10 tablet, Rfl: 0 .  ONE TOUCH LANCETS MISC, 1 Package by Does not apply route every morning., Disp:  100 each, Rfl: 0 .  travoprost, benzalkonium, (TRAVATAN) 0.004 % ophthalmic solution, Place 1 drop into both eyes at bedtime., Disp: , Rfl:   Exam: Current vital signs: BP (!) 157/89   Pulse 65   Temp 98.1 F (36.7 C) (Oral)   Resp 18   Ht '5\' 2"'  (1.575 m)   Wt 98.5 kg   SpO2 99%   BMI 39.72 kg/m  Vital signs in last 24 hours: Temp:  [98.1 F (36.7 C)] 98.1 F (36.7 C) (10/06 1400) Pulse Rate:  [65] 65 (10/06 1400) Resp:  [18] 18 (10/06 1400) BP: (157)/(89) 157/89 (10/06 1400) SpO2:  [99 %] 99 % (10/06 1400) Weight:  [98.5 kg] 98.5 kg (10/06 1401) GENERAL: Awake, alert in NAD HEENT: - Normocephalic and atraumatic, dry mm, no LN++, no Thyromegally LUNGS - Clear to auscultation bilaterally with no wheezes CV - S1S2 RRR, no m/r/g, equal pulses bilaterally. ABDOMEN - Soft, nontender, nondistended with normoactive BS Ext: warm, well perfused, intact peripheral pulses, no edema NEURO:  Mental Status: AA&Ox3 Language: speech is mildly dysarthric.  Naming, repetition, fluency, and comprehension intact. Cranial Nerves: PERRL. EOMI, visual fields full, no facial asymmetry, facial sensation intact to LT but subjectively feels numb, hearing intact, tongue/uvula/soft palate midline, normal sternocleidomastoid and trapezius muscle strength. No evidence of tongue atrophy or fibrillations Motor: No vertical drift in any of the four ext.  Slight difficulty in performing fine movements with the left hand. Tone: is normal and bulk is normal Sensation- Intact to light touch bilaterally-continues to report feeling of heaviness although there is no vertical drift. Coordination: FTN intact bilaterally Gait- deferred  NIHSS-2 for subjective sensory findings and dysarthria.   Labs I have reviewed labs in epic and the results pertinent to this consultation are:  CBC    Component Value Date/Time   WBC 7.9 06/19/2018 0740   RBC 4.71 06/19/2018 0740   HGB 13.6 06/19/2018 0740   HGB 13.4  08/02/2017 1035   HCT 42.9 06/19/2018 0740   HCT 40.7 08/02/2017 1035   PLT 298 06/19/2018 0740   MCV 91.1 06/19/2018 0740   MCV 86 08/02/2017 1035   MCH 28.9 06/19/2018 0740   MCHC 31.7 06/19/2018 0740   RDW 14.0 06/19/2018 0740   RDW 14.9 08/02/2017 1035   LYMPHSABS 1.9 06/19/2018 0740   LYMPHSABS 2.2 08/02/2017 1035   MONOABS 0.4 06/19/2018 0740   EOSABS 0.1 06/19/2018 0740   EOSABS 0.2 08/02/2017 1035   BASOSABS 0.0 06/19/2018 0740   BASOSABS 0.0 08/02/2017 1035    CMP     Component Value Date/Time   NA 138 06/21/2018 1407   NA 141 08/02/2017 1035   K 3.9 06/21/2018 1407   CL 105 06/21/2018 1407   CO2 27 06/21/2018 1407   GLUCOSE 140 (H) 06/21/2018 1407   BUN  6 (L) 06/21/2018 1407   BUN 14 08/02/2017 1035   CREATININE 0.82 06/21/2018 1407   CALCIUM 9.1 06/21/2018 1407   PROT 7.5 06/19/2018 0740   PROT 6.9 08/02/2017 1035   ALBUMIN 4.0 06/19/2018 0740   ALBUMIN 4.4 08/02/2017 1035   AST 13 (L) 06/19/2018 0740   ALT 14 06/19/2018 0740   ALKPHOS 66 06/19/2018 0740   BILITOT 0.6 06/19/2018 0740   BILITOT 0.3 08/02/2017 1035   GFRNONAA >60 06/21/2018 1407   GFRAA >60 06/21/2018 1407     Imaging I have reviewed the images obtained:  CT-scan of the brain-no acute changes.  CTA head and neck with no LVO.  Assessment: 63 year old with hypertension noncompliant to medication presenting with sudden onset of left-sided numbness. Has a low NIH stroke scale -just subjective numbness and may be questionable subtle  Dysarthria. She did have some difficulty with fine motor skills in the left hand. She is employed full-time and is fully functional at baseline. I discussed the option of IV TPA for small strokes-suspecting that she has a small thalamic/capsular lacunar infarct.  I discussed the risks and benefits. She agreed to go ahead with IV TPA understanding fully the risks and benefits.  Impression: Acute ischemic stroke-small vessel etiology Hypertensive  emergency  Recommendations: Admit to ICU No aspirin or anticoagulation for 24 hours MRI brain without contrast at 24 hours Frequent neurochecks-per post TPA protocol Vitals per post TPA protocol Strict blood pressure control with systolic less than 654. 2D echocardiogram A1c, lipid panel IV normal saline 75 cc an hour Morning labs PT OT speech therapy N.p.o. until cleared by bedside swallow or formal swallow evaluation  Code Status: Full Code     THE FOLLOWING WERE PRESENT ON ADMISSION: CNS -  Acute Ischemic Stroke, hypertensive emergency    -- Amie Portland, MD [facility] Pager: [phone] If 7pm to 7am, please call on call as listed on AMION.  CRITICAL CARE ATTESTATION Performed by: Amie Portland, MD Total critical care time: 30 minutes Critical care time was exclusive of separately billable procedures and treating other patients and/or supervising APPs/Residents/Students Critical care was necessary to treat or prevent imminent or life-threatening deterioration due to possible acute ischemic stroke, IV thrombolytic (tPA) administration, hypertensive emergency This patient is critically ill and at significant risk for neurological worsening and/or death and care requires constant monitoring. Critical care was time spent personally by me on the following activities: development of treatment plan with patient and/or surrogate as well as nursing, discussions with consultants, evaluation of patient's response to treatment, examination of patient, obtaining history from patient or surrogate, ordering and performing treatments and interventions, ordering and review of laboratory studies, ordering and review of radiographic studies, pulse oximetry, re-evaluation of patient's condition, participation in multidisciplinary rounds and medical decision making of high complexity in the care of this patient.

## 2019-03-28 NOTE — ED Triage Notes (Signed)
Pt brought via [facility] from home for c/o left facial numbness and left arm heaviness. LKW 1230 today. Pt is A&Ox4. PERRLA. Pt is NSR on monitor

## 2019-03-28 NOTE — Progress Notes (Signed)
Pharmacist Code Stroke Response  Notified to mix tPA at 1329 Delivered tPA to RN at 1333   tPA dose = 8.9 mg bolus over 1 minute followed by 79.8 mg for a total dose of 88.7 mg over 1 hour  Issues/delays encountered (if applicable): N/A  [name] 03/28/19 1:43 PM

## 2019-03-28 NOTE — ED Provider Notes (Signed)
[facility] EMERGENCY DEPARTMENT Provider Note   CSN: [id number] Arrival date & time: 03/28/19  1316  An emergency department physician performed an initial assessment on this suspected stroke patient at 1318.  History   Chief Complaint Chief Complaint  Patient presents with   Code Stroke    HPI [name] is a 63 y.o. female.     HPI  64 year old female presents with acute facial numbness and left arm heaviness. LSN 12:30. No headache. No weakness per patient. History of HTN but no longer on meds. EMS called code stroke.  No vision changes.   Past Medical History:  Diagnosis Date   Anemia 2006   required transfusion post TAH/BSO 05/2005   Arthritis    Asthma    Chronic cough    Colon polyps    hyperplastic   Diabetes mellitus without complication (HCC)    Diverticulosis    GERD (gastroesophageal reflux disease)    on prilosec   GLA deficiency ([place])    Glaucoma of both eyes    Hypertension    Joint pain    Osteoarthritis    Pancreatitis    Shortness of breath dyspnea     Patient Active Problem List   Diagnosis Date Noted   Acute ischemic stroke ([place]) 03/28/2019   GERD (gastroesophageal reflux disease) 06/19/2018   Constipation 06/19/2018   Other fatigue 08/02/2017   Shortness of breath on exertion 08/02/2017   Abdominal pain, epigastric    Abnormal CT of the abdomen    Pancreatitis, acute    Acute pancreatitis 08/13/2015   UTI (lower urinary tract infection) 08/13/2015   Severe obesity (BMI >= 40) ([place]) 01/23/2015   Sinusitis, chronic 01/08/2015   Cough variant asthma 01/03/2015   Arthritis of right hip 12/12/2013   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   Benign neoplasm of colon 08/22/2013   Special screening for malignant neoplasms, colon 08/22/2013   Abdominal pain 08/19/2013   Diabetes mellitus without complication ([place]) 72/02/4708   Essential hypertension, benign 08/19/2013     Ventral hernia 08/19/2013    Past Surgical History:  Procedure Laterality Date   ABDOMINAL HYSTERECTOMY     BREAST EXCISIONAL BIOPSY Right 1990   COLONOSCOPY N/A 08/22/2013   Procedure: COLONOSCOPY;  Surgeon: Irene Shipper, MD;  Location: [facility];  Service: Endoscopy;  Laterality: N/A;   ESOPHAGOGASTRODUODENOSCOPY N/A 08/22/2013   Procedure: ESOPHAGOGASTRODUODENOSCOPY (EGD);  Surgeon: Irene Shipper, MD;  Location: [facility] ENDOSCOPY;  Service: Endoscopy;  Laterality: N/A;   ESOPHAGOGASTRODUODENOSCOPY (EGD) WITH PROPOFOL N/A 08/19/2015   Procedure: ESOPHAGOGASTRODUODENOSCOPY (EGD) WITH PROPOFOL;  Surgeon: Irene Shipper, MD;  Location: [facility] ENDOSCOPY;  Service: Endoscopy;  Laterality: N/A;   HERNIA REPAIR     HYSTEROSCOPY W/D&C  01/2005   for uterine fibroids.    INSERTION OF MESH N/A 08/24/2013   Procedure: INSERTION OF MESH;  Surgeon: Edward Jolly, MD;  Location: [facility];  Service: General;  Laterality: N/A;   JOINT REPLACEMENT Right    LIPOMA EXCISION Left 06/21/2015   Procedure: EXCISION OF LEFT SCALP LIPOMA;  Surgeon: Johnathan Hausen, MD;  Location: [facility];  Service: General;  Laterality: Left;   PANNICULECTOMY N/A 08/24/2013   Procedure: PANNICULECTOMY;  Surgeon: Edward Jolly, MD;  Location: [facility] [facility];  Service: General;  Laterality: N/A;   TOTAL ABDOMINAL HYSTERECTOMY W/ BILATERAL SALPINGOOPHORECTOMY  05/2005   TOTAL HIP ARTHROPLASTY Right 12/12/2013   Procedure: RIGHT TOTAL HIP ARTHROPLASTY ANTERIOR APPROACH;  Surgeon: Mcarthur Rossetti, MD;  Location: [facility];  Service: Orthopedics;  Laterality: Right;   VENTRAL HERNIA REPAIR N/A 08/24/2013   Procedure: HERNIA REPAIR VENTRAL ADULT;  Surgeon: Edward Jolly, MD;  Location: [facility] OR;  Service: General;  Laterality: N/A;     OB History    Gravida  5   Para      Term      Preterm      AB      Living  5     SAB      TAB      Ectopic      Multiple      Live Births                Home Medications    Prior to Admission medications   Medication Sig Start Date End Date Taking? Authorizing Provider  budesonide-formoterol (SYMBICORT) 80-4.5 MCG/ACT inhaler Take 2 puffs first thing in am and then another 2 puffs about 12 hours later. Patient taking differently: Inhale 2 puffs into the lungs 2 (two) times daily as needed (wheezing or shortness of breath).  01/03/15  Yes Tanda Rockers, MD  glimepiride (AMARYL) 4 MG tablet Take 0.5 tablets (2 mg total) by mouth daily with breakfast. Patient taking differently: Take 4 mg by mouth daily with breakfast.  06/23/18  Yes Georgette Shell, MD  MELATONIN PO Take 1 tablet by mouth at bedtime as needed (sleep).    Yes [provider]  metFORMIN (GLUCOPHAGE) 1000 MG tablet Take 1,000 mg by mouth 2 (two) times daily with a meal.   Yes [provider]  blood glucose meter kit and supplies KIT Dispense based on patient and insurance preference. Use up to four times daily as directed. (FOR ICD-9 250.00, 250.01). Patient not taking: Reported on 03/28/2019 08/02/17   Dennard Nip D, MD  glucose blood test strip Use as instructed 08/25/17   Dennard Nip D, MD  HYDROcodone-acetaminophen (NORCO/VICODIN) 5-325 MG tablet Take 1 tablet by mouth every 4 (four) hours as needed. Patient not taking: Reported on 03/28/2019 06/17/18   Isla Pence, MD  ondansetron (ZOFRAN ODT) 4 MG disintegrating tablet Take 1 tablet (4 mg total) by mouth every 8 (eight) hours as needed. Patient not taking: Reported on 03/28/2019 06/17/18   Isla Pence, MD  ONE TOUCH LANCETS MISC 1 Package by Does not apply route every morning. Patient not taking: Reported on 03/28/2019 08/02/17   Starlyn Skeans, MD    Family History Family History  Problem Relation Age of Onset   Emphysema Mother        smoked   Diabetes Mother    Hypertension Mother    Colon cancer Father        7-s   Hypertension Father    Heart disease Father    Breast  cancer Sister     Social History Social History   Tobacco Use   Smoking status: Former Smoker    Packs/day: 0.25    Years: 15.00    Pack years: 3.75    Types: Cigarettes    Quit date: 06/22/1978    Years since quitting: 40.7   Smokeless tobacco: Never Used  Substance Use Topics   Alcohol use: No    Alcohol/week: 0.0 standard drinks   Drug use: No     Allergies   Diclofenac and Penicillins   Review of Systems Review of Systems  Eyes: Negative for visual disturbance.  Neurological: Positive for numbness. Negative  for weakness and headaches.  All other systems reviewed and are negative.    Physical Exam Updated Vital Signs BP (!) 142/79    Pulse (!) 59    Temp 98.1 F (36.7 C) (Oral)    Resp 17    Ht 5' 2" (1.575 m)    Wt 98.5 kg    SpO2 100%    BMI 39.72 kg/m   Physical Exam Vitals signs and nursing note reviewed.  Constitutional:      General: She is not in acute distress.    Appearance: She is well-developed. She is not ill-appearing or diaphoretic.  HENT:     Head: Normocephalic and atraumatic.     Right Ear: External ear normal.     Left Ear: External ear normal.     Nose: Nose normal.  Eyes:     General:        Right eye: No discharge.        Left eye: No discharge.     Extraocular Movements: Extraocular movements intact.  Cardiovascular:     Rate and Rhythm: Normal rate and regular rhythm.     Heart sounds: Normal heart sounds.  Pulmonary:     Effort: Pulmonary effort is normal.     Breath sounds: Normal breath sounds.  Abdominal:     Palpations: Abdomen is soft.     Tenderness: There is no abdominal tenderness.  Skin:    General: Skin is warm and dry.  Neurological:     Mental Status: She is alert.     Comments: CN 3-12 grossly intact. 5/5 strength in right upper, left upper, and right lower extremities. 4/5 strength in LLE. Grossly normal sensation. Normal finger to nose.   Psychiatric:        Mood and Affect: Mood is not anxious.       ED Treatments / Results  Labs (all labs ordered are listed, but only abnormal results are displayed) Labs Reviewed  COMPREHENSIVE METABOLIC PANEL - Abnormal; Notable for the following components:      Result Value   Glucose, Bld 157 (*)    Creatinine, Ser 1.01 (*)    All other components within normal limits  I-STAT CHEM 8, ED - Abnormal; Notable for the following components:   Glucose, Bld 153 (*)    Calcium, Ion 1.08 (*)    All other components within normal limits  CBG MONITORING, ED - Abnormal; Notable for the following components:   Glucose-Capillary 146 (*)    All other components within normal limits  SARS CORONAVIRUS 2 (HOSPITAL ORDER, [facility])  ETHANOL  PROTIME-INR  APTT  CBC  DIFFERENTIAL  RAPID URINE DRUG SCREEN, HOSP PERFORMED  URINALYSIS, ROUTINE W REFLEX MICROSCOPIC    EKG None  Radiology Ct Angio Head W Or Wo Contrast  Result Date: 03/28/2019 CLINICAL DATA:  Focal neuro deficit. Left facial numbness. Left arm heaviness. EXAM: CT ANGIOGRAPHY HEAD AND NECK TECHNIQUE: Multidetector CT imaging of the head and neck was performed using the standard protocol during bolus administration of intravenous contrast. Multiplanar CT image reconstructions and MIPs were obtained to evaluate the vascular anatomy. Carotid stenosis measurements (when applicable) are obtained utilizing NASCET criteria, using the distal internal carotid diameter as the denominator. CONTRAST:  55m OMNIPAQUE IOHEXOL 350 MG/ML SOLN COMPARISON:  CT head 03/28/2019 FINDINGS: CTA NECK FINDINGS Aortic arch: Bovine arch. Proximal great vessels widely patent. Normal aortic arch. Right carotid system: Normal right carotid. Negative for stenosis or dissection Left  carotid system: Normal left carotid. Negative for stenosis or dissection. Vertebral arteries: Hypoplastic posterior circulation due to fetal origin of the posterior cerebral arteries bilaterally. Small vertebral  arteries bilaterally, both patent to the basilar without significant disease. Skeleton: No acute skeletal abnormality. Other neck: Incidental left thyroid nodule measuring 21 x 30 mm. Upper chest: Lung apices clear bilaterally. Review of the MIP images confirms the above findings CTA HEAD FINDINGS Anterior circulation: Cavernous carotid widely patent bilaterally. Anterior and middle cerebral arteries patent bilaterally without stenosis or large vessel occlusion. Posterior circulation: Hypoplastic posterior circulation. Right vertebral artery dominant and patent to the basilar. Small left vertebral artery with atherosclerotic plaque and moderate to severe stenosis at the skull base. Minimal contribution to the basilar. Small basilar which supplies posterior cerebral arteries bilaterally. Fetal origin of the posterior cerebral artery bilaterally is without significant stenosis. Venous sinuses: Normal venous enhancement Anatomic variants: None. Review of the MIP images confirms the above findings IMPRESSION: 1. No significant carotid   stenosis in the neck. 2. 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 Right vertebral artery dominant and widely patent. 3. Negative for intracranial large vessel occlusion. 4. Incidental thyroid nodule 21 x 30 mm. Recommend thyroid ultrasound and consideration of biopsy to rule out neoplasm. Electronically Signed   By: Franchot Gallo M.D.   On: 03/28/2019 14:01   Ct Angio Neck W Or Wo Contrast  Result Date: 03/28/2019 CLINICAL DATA:  Focal neuro deficit. Left facial numbness. Left arm heaviness. EXAM: CT ANGIOGRAPHY HEAD AND NECK TECHNIQUE: Multidetector CT imaging of the head and neck was performed using the standard protocol during bolus administration of intravenous contrast. Multiplanar CT image reconstructions and MIPs were obtained to evaluate the vascular anatomy. Carotid stenosis measurements (when applicable) are obtained utilizing NASCET  criteria, using the distal internal carotid diameter as the denominator. CONTRAST:  41m OMNIPAQUE IOHEXOL 350 MG/ML SOLN COMPARISON:  CT head 03/28/2019 FINDINGS: CTA NECK FINDINGS Aortic arch: Bovine arch. Proximal great vessels widely patent. Normal aortic arch. Right carotid system: Normal right carotid. Negative for stenosis or dissection Left carotid system: Normal left carotid. Negative for stenosis or dissection. Vertebral arteries: Hypoplastic posterior circulation due to fetal origin of the posterior cerebral arteries bilaterally. Small vertebral arteries bilaterally, both patent to the basilar without significant disease. Skeleton: No acute skeletal abnormality. Other neck: Incidental left thyroid nodule measuring 21 x 30 mm. Upper chest: Lung apices clear bilaterally. Review of the MIP images confirms the above findings CTA HEAD FINDINGS Anterior circulation: Cavernous carotid widely patent bilaterally. Anterior and middle cerebral arteries patent bilaterally without stenosis or large vessel occlusion. Posterior circulation: Hypoplastic posterior circulation. Right vertebral artery dominant and patent to the basilar. Small left vertebral artery with atherosclerotic plaque and moderate to severe stenosis at the skull base. Minimal contribution to the basilar. Small basilar which supplies posterior cerebral arteries bilaterally. Fetal origin of the posterior cerebral artery bilaterally is without significant stenosis. Venous sinuses: Normal venous enhancement Anatomic variants: None. Review of the MIP images confirms the above findings IMPRESSION: 1. No significant carotid   stenosis in the neck. 2. 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 Right vertebral artery dominant and widely patent. 3. Negative for intracranial large vessel occlusion. 4. Incidental thyroid nodule 21 x 30 mm. Recommend thyroid ultrasound and consideration of biopsy to rule out neoplasm.  Electronically Signed   By: CFranchot GalloM.D.   On: 03/28/2019 14:01  Ct Head Code Stroke Wo Contrast  Result Date: 03/28/2019 CLINICAL DATA:  Code stroke. Focal neuro deficit less than 6 hours. Left facial numbness. Left arm weakness. EXAM: CT HEAD WITHOUT CONTRAST TECHNIQUE: Contiguous axial images were obtained from the base of the skull through the vertex without intravenous contrast. COMPARISON:  None. FINDINGS: Brain: No evidence of acute infarction, hemorrhage, hydrocephalus, extra-axial collection or mass lesion/mass effect. Vascular: Negative for hyperdense vessel Skull: Negative Sinuses/Orbits: Negative Other: None ASPECTS ([place] Stroke Program Early CT Score) - Ganglionic level infarction (caudate, lentiform nuclei, internal capsule, insula, M1-M3 cortex): 7 - Supraganglionic infarction (M4-M6 cortex): 3 Total score (0-10 with 10 being normal): 10 IMPRESSION: 1. Negative CT head 2. ASPECTS is 10 Electronically Signed   By: Franchot Gallo M.D.   On: 03/28/2019 13:33    Procedures .Critical Care Performed by: Sherwood Gambler, MD Authorized by: Sherwood Gambler, MD   Critical care provider statement:    Critical care time (minutes):  30   Critical care time was exclusive of:  Separately billable procedures and treating other patients   Critical care was necessary to treat or prevent imminent or life-threatening deterioration of the following conditions:  CNS failure or compromise   Critical care was time spent personally by me on the following activities:  Discussions with consultants, evaluation of patient's response to treatment, examination of patient, ordering and performing treatments and interventions, ordering and review of laboratory studies, ordering and review of radiographic studies, pulse oximetry, re-evaluation of patient's condition, obtaining history from patient or surrogate and review of old charts   (including critical care time)  Medications Ordered in  ED Medications   stroke: mapping our early stages of recovery book (has no administration in time range)  0.9 %  sodium chloride infusion ( Intravenous New Bag/Given 03/28/19 1509)  acetaminophen (TYLENOL) tablet 650 mg (has no administration in time range)    Or  acetaminophen (TYLENOL) solution 650 mg (has no administration in time range)    Or  acetaminophen (TYLENOL) suppository 650 mg (has no administration in time range)  senna-docusate (Senokot-S) tablet 1 tablet (has no administration in time range)  pantoprazole (PROTONIX) injection 40 mg (has no administration in time range)  labetalol (NORMODYNE) injection 20 mg (has no administration in time range)    And  clevidipine (CLEVIPREX) infusion 0.5 mg/mL (has no administration in time range)  alteplase (ACTIVASE) 1 mg/mL infusion 88.7 mg (0 mg/kg  98.5 kg Intravenous Stopped 03/28/19 1507)    Followed by  0.9 %  sodium chloride infusion (0 mLs Intravenous Stopped 03/28/19 1508)  iohexol (OMNIPAQUE) 350 MG/ML injection 80 mL (75 mLs Intravenous Contrast Given 03/28/19 1351)     Initial Impression / Assessment and Plan / ED Course  I have reviewed the triage vital signs and the nursing notes.  Pertinent labs & imaging results that were available during my care of the patient were reviewed by me and considered in my medical decision making (see chart for details).        Patient presents with acute stroke symptoms. Given presentation, neurology will give TPA. Protecting airway. BP elevated but within limits to give thrombolytics. Admit to neuro ICU.  [name] was evaluated in Emergency Department on 03/28/2019 for the symptoms described in the history of present illness. She was evaluated in the context of the global COVID-19 pandemic, which necessitated consideration that the patient might be at risk for infection with the SARS-CoV-2 virus that causes COVID-19. Institutional protocols and algorithms  that pertain to the  evaluation of patients at risk for COVID-19 are in a state of rapid change based on information released by regulatory bodies including the CDC and federal and state organizations. These policies and algorithms were followed during the patient's care in the ED.   Final Clinical Impressions(s) / ED Diagnoses   Final diagnoses:  Acute ischemic stroke [facility])    ED Discharge Orders    None       Sherwood Gambler, MD 03/28/19 [phone]

## 2019-03-29 ENCOUNTER — Inpatient Hospital Stay (HOSPITAL_COMMUNITY): Payer: BC Managed Care – PPO

## 2019-03-29 DIAGNOSIS — E041 Nontoxic single thyroid nodule: Secondary | ICD-10-CM

## 2019-03-29 DIAGNOSIS — E785 Hyperlipidemia, unspecified: Secondary | ICD-10-CM

## 2019-03-29 DIAGNOSIS — I10 Essential (primary) hypertension: Secondary | ICD-10-CM

## 2019-03-29 DIAGNOSIS — E1159 Type 2 diabetes mellitus with other circulatory complications: Secondary | ICD-10-CM

## 2019-03-29 LAB — GLUCOSE, CAPILLARY
Glucose-Capillary: 163 mg/dL — ABNORMAL HIGH (ref 70–99)
Glucose-Capillary: 194 mg/dL — ABNORMAL HIGH (ref 70–99)
Glucose-Capillary: 258 mg/dL — ABNORMAL HIGH (ref 70–99)
Glucose-Capillary: 174 mg/dL — ABNORMAL HIGH (ref 70–99)

## 2019-03-29 LAB — HEMOGLOBIN A1C
Hgb A1c MFr Bld: 7.7 % — ABNORMAL HIGH (ref 4.8–5.6)
Mean Plasma Glucose: 174.29 mg/dL

## 2019-03-29 LAB — LIPID PANEL
Cholesterol: 129 mg/dL (ref 0–200)
HDL: 46 mg/dL (ref >40)
LDL Cholesterol: 73 mg/dL (ref 0–99)
Total CHOL/HDL Ratio: 2.8 RATIO
Triglycerides: 50 mg/dL (ref <150)
VLDL: 10 mg/dL (ref 0–40)

## 2019-03-29 MED ORDER — ASPIRIN EC 81 MG PO TBEC
81.0000 mg | DELAYED_RELEASE_TABLET | Freq: Every day | ORAL | Status: DC
  Administered 2019-03-29 – 2019-03-30 (×2): 81 mg via ORAL
  Filled 2019-03-29 (×2): qty 1

## 2019-03-29 MED ORDER — PANTOPRAZOLE SODIUM 40 MG PO TBEC
40.0000 mg | DELAYED_RELEASE_TABLET | Freq: Every day | ORAL | Status: DC
  Administered 2019-03-29 – 2019-03-30 (×2): 40 mg via ORAL
  Filled 2019-03-29 (×2): qty 1

## 2019-03-29 MED ORDER — LORAZEPAM 2 MG/ML IJ SOLN
INTRAMUSCULAR | Status: AC
  Administered 2019-03-29: 1 mg
  Filled 2019-03-29: qty 1

## 2019-03-29 MED ORDER — ATORVASTATIN CALCIUM 10 MG PO TABS
20.0000 mg | ORAL_TABLET | Freq: Every day | ORAL | Status: DC
  Administered 2019-03-29: 20 mg via ORAL
  Filled 2019-03-29: qty 2

## 2019-03-29 NOTE — Evaluation (Signed)
Physical Therapy Evaluation and Discharge  Patient Details Name: [name] MRN: [id number] DOB: 1956/01/01 Today's Date: 03/29/2019   History of Present Illness  63 y.o. female past medical history of hypertension currently not on medications due to suffering side effects from lisinopril that made her blood pressures go way down, diabetes, arthritis, glaucoma presented 03/28/19 to the emergency room for sudden onset of left-sided numbness and heaviness. +tPA; Head CT negative; MRI pending  Clinical Impression   Patient evaluated by Physical Therapy with no further acute PT needs identified. All education has been completed and the patient has no further questions. Patient with slightly antalgic gait due to right hip "stiffness" which she reports is normal for her (s/p rt THA).  PT is signing off. Thank you for this referral.     Follow Up Recommendations No PT follow up    Equipment Recommendations  None recommended by PT    Recommendations for Other Services       Precautions / Restrictions Precautions Precautions: None Restrictions Weight Bearing Restrictions: No      Mobility  Bed Mobility Overal bed mobility: Independent                Transfers Overall transfer level: Independent Equipment used: None                Ambulation/Gait Ambulation/Gait assistance: Supervision;Independent Gait Distance (Feet): 80 Feet Assistive device: None Gait Pattern/deviations: Antalgic;Decreased stride length;Wide base of support     General Gait Details: initially limping due to left hip pain; pain improved as moving more and progressed to independent  Stairs            Wheelchair Mobility    Modified Rankin (Stroke Patients Only) Modified Rankin (Stroke Patients Only) Pre-Morbid Rankin Score: No symptoms Modified Rankin: No symptoms     Balance Overall balance assessment: Independent                                            Pertinent Vitals/Pain Pain Assessment: 0-10 Pain Score: 5  Pain Location: rt hip  Pain Descriptors / Indicators: Burning;Grimacing;Dull Pain Intervention(s): Limited activity within patient's tolerance;Monitored during session;Repositioned;Other (comment)(activity as pain related to arthritis (per pt))    Home Living Family/patient expects to be discharged to:: Private residence Living Arrangements: Spouse/significant other;Children;Other relatives(4 grandchidren: 71-13 yo) Available Help at Discharge: Family;Available 24 hours/day Type of Home: House Home Access: Stairs to enter Entrance Stairs-Rails: Psychiatric nurse of Steps: 2 Home Layout: One level Home Equipment: None(husband has walker and uses (amputee))      Prior Function Level of Independence: Independent         Comments: She works as a Education officer, 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      Hand Dominance   Dominant Hand: Right    Extremity/Trunk Assessment   Upper Extremity Assessment Upper Extremity Assessment: Defer to OT evaluation    Lower Extremity Assessment Lower Extremity Assessment: Overall WFL for tasks assessed(sensation and coordination intact)    Cervical / Trunk Assessment Cervical / Trunk Assessment: Other exceptions Cervical / Trunk Exceptions: obese  Communication   Communication: No difficulties  Cognition Arousal/Alertness: Awake/alert Behavior During Therapy: WFL for tasks assessed/performed Overall Cognitive Status: Within Functional Limits for tasks assessed  General Comments General comments (skin integrity, edema, etc.): Educated pt on importance of exercise/activity for arthritis, weight and BP control. She reports she does not regularly exercise "but I guess I will now."     Exercises     Assessment/Plan    PT Assessment Patent does not need any further PT services  PT Problem List         PT  Treatment Interventions      PT Goals (Current goals can be found in the Care Plan section)  Acute Rehab PT Goals PT Goal Formulation: All assessment and education complete, DC therapy    Frequency     Barriers to discharge        Co-evaluation               AM-PAC PT "6 Clicks" Mobility  Outcome Measure Help needed turning from your back to your side while in a flat bed without using bedrails?: None Help needed moving from lying on your back to sitting on the side of a flat bed without using bedrails?: None Help needed moving to and from a bed to a chair (including a wheelchair)?: None Help needed standing up from a chair using your arms (e.g., wheelchair or bedside chair)?: None Help needed to walk in hospital room?: None Help needed climbing 3-5 steps with a railing? : None 6 Click Score: 24    End of Session Equipment Utilized During Treatment: Gait belt Activity Tolerance: Patient tolerated treatment well Patient left: in chair;with call bell/phone within reach Nurse Communication: Mobility status PT Visit Diagnosis: Other abnormalities of gait and mobility (R26.89)    Time: [id number] PT Time Calculation (min) (ACUTE ONLY): 28 min   Charges:   PT Evaluation $PT Eval Low Complexity: 1 Low PT Treatments $Self Care/Home Management: 8-22          Barry Brunner, PT      [place] P Lakima Dona 03/29/2019, 11:14 AM

## 2019-03-29 NOTE — Progress Notes (Signed)
STROKE TEAM PROGRESS NOTE   INTERVAL HISTORY Pt lying in bed, stated that her left sided numbness and heaviness has resolved. Pending MRI. She passed swallow and on diet.   Vitals:   03/29/19 0600 03/29/19 0630 03/29/19 0700 03/29/19 0800  BP: 119/84 125/76 122/78   Pulse: 64 64 66   Resp: 17 16 16    Temp:    98.2 F (36.8 C)  TempSrc:    Oral  SpO2: 98% 100% 99%   Weight:      Height:        CBC:  Recent Labs  Lab 03/28/19 1326 03/28/19 1328  WBC 7.4  --   NEUTROABS 2.8  --   HGB 14.3 14.6  HCT 42.2 43.0  MCV 90.2  --   PLT 263  --     Basic Metabolic Panel:  Recent Labs  Lab 03/28/19 1326 03/28/19 1328  NA 139 139  K 3.6 4.8  CL 104 105  CO2 24  --   GLUCOSE 157* 153*  BUN 13 18  CREATININE 1.01* 0.90  CALCIUM 9.5  --    Lipid Panel:     Component Value Date/Time   CHOL 129 03/29/2019 0324   CHOL 135 08/02/2017 1035   TRIG 50 03/29/2019 0324   HDL 46 03/29/2019 0324   HDL 54 08/02/2017 1035   CHOLHDL 2.8 03/29/2019 0324   VLDL 10 03/29/2019 0324   LDLCALC 73 03/29/2019 0324   LDLCALC 68 08/02/2017 1035   HgbA1c:  Lab Results  Component Value Date   HGBA1C 7.7 (H) 03/29/2019   Urine Drug Screen:     Component Value Date/Time   LABOPIA NONE DETECTED 03/28/2019 1515   COCAINSCRNUR NONE DETECTED 03/28/2019 1515   LABBENZ NONE DETECTED 03/28/2019 1515   AMPHETMU NONE DETECTED 03/28/2019 1515   THCU NONE DETECTED 03/28/2019 1515   LABBARB NONE DETECTED 03/28/2019 1515    Alcohol Level     Component Value Date/Time   ETH <10 03/28/2019 1326    IMAGING Ct Angio Head W Or Wo Contrast  Result Date: 03/28/2019 CLINICAL DATA:  Focal neuro deficit. Left facial numbness. Left arm heaviness. EXAM: CT ANGIOGRAPHY HEAD AND NECK TECHNIQUE: Multidetector CT imaging of the head and neck was performed using the standard protocol during bolus administration of intravenous contrast. Multiplanar CT image reconstructions and MIPs were obtained to evaluate  the vascular anatomy. Carotid stenosis measurements (when applicable) are obtained utilizing NASCET criteria, using the distal internal carotid diameter as the denominator. CONTRAST:  36mL OMNIPAQUE IOHEXOL 350 MG/ML SOLN COMPARISON:  CT head 03/28/2019 FINDINGS: CTA NECK FINDINGS Aortic arch: Bovine arch. Proximal great vessels widely patent. Normal aortic arch. Right carotid system: Normal right carotid. Negative for stenosis or dissection Left carotid system: Normal left carotid. Negative for stenosis or dissection. Vertebral arteries: Hypoplastic posterior circulation due to fetal origin of the posterior cerebral arteries bilaterally. Small vertebral arteries bilaterally, both patent to the basilar without significant disease. Skeleton: No acute skeletal abnormality. Other neck: Incidental left thyroid nodule measuring 21 x 30 mm. Upper chest: Lung apices clear bilaterally. Review of the MIP images confirms the above findings CTA HEAD FINDINGS Anterior circulation: Cavernous carotid widely patent bilaterally. Anterior and middle cerebral arteries patent bilaterally without stenosis or large vessel occlusion. Posterior circulation: Hypoplastic posterior circulation. Right vertebral artery dominant and patent to the basilar. Small left vertebral artery with atherosclerotic plaque and moderate to severe stenosis at the skull base. Minimal contribution to the basilar. Small basilar which  supplies posterior cerebral arteries bilaterally. Fetal origin of the posterior cerebral artery bilaterally is without significant stenosis. Venous sinuses: Normal venous enhancement Anatomic variants: None. Review of the MIP images confirms the above findings IMPRESSION: 1. No significant carotid   stenosis in the neck. 2. 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 Right vertebral artery dominant and widely patent. 3. Negative for intracranial large vessel occlusion. 4. Incidental thyroid nodule  21 x 30 mm. Recommend thyroid ultrasound and consideration of biopsy to rule out neoplasm. Electronically Signed   By: Franchot Gallo M.D.   On: 03/28/2019 14:01   Ct Angio Neck W Or Wo Contrast  Result Date: 03/28/2019 CLINICAL DATA:  Focal neuro deficit. Left facial numbness. Left arm heaviness. EXAM: CT ANGIOGRAPHY HEAD AND NECK TECHNIQUE: Multidetector CT imaging of the head and neck was performed using the standard protocol during bolus administration of intravenous contrast. Multiplanar CT image reconstructions and MIPs were obtained to evaluate the vascular anatomy. Carotid stenosis measurements (when applicable) are obtained utilizing NASCET criteria, using the distal internal carotid diameter as the denominator. CONTRAST:  23mL OMNIPAQUE IOHEXOL 350 MG/ML SOLN COMPARISON:  CT head 03/28/2019 FINDINGS: CTA NECK FINDINGS Aortic arch: Bovine arch. Proximal great vessels widely patent. Normal aortic arch. Right carotid system: Normal right carotid. Negative for stenosis or dissection Left carotid system: Normal left carotid. Negative for stenosis or dissection. Vertebral arteries: Hypoplastic posterior circulation due to fetal origin of the posterior cerebral arteries bilaterally. Small vertebral arteries bilaterally, both patent to the basilar without significant disease. Skeleton: No acute skeletal abnormality. Other neck: Incidental left thyroid nodule measuring 21 x 30 mm. Upper chest: Lung apices clear bilaterally. Review of the MIP images confirms the above findings CTA HEAD FINDINGS Anterior circulation: Cavernous carotid widely patent bilaterally. Anterior and middle cerebral arteries patent bilaterally without stenosis or large vessel occlusion. Posterior circulation: Hypoplastic posterior circulation. Right vertebral artery dominant and patent to the basilar. Small left vertebral artery with atherosclerotic plaque and moderate to severe stenosis at the skull base. Minimal contribution to the  basilar. Small basilar which supplies posterior cerebral arteries bilaterally. Fetal origin of the posterior cerebral artery bilaterally is without significant stenosis. Venous sinuses: Normal venous enhancement Anatomic variants: None. Review of the MIP images confirms the above findings IMPRESSION: 1. No significant carotid   stenosis in the neck. 2. 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 Right vertebral artery dominant and widely patent. 3. Negative for intracranial large vessel occlusion. 4. Incidental thyroid nodule 21 x 30 mm. Recommend thyroid ultrasound and consideration of biopsy to rule out neoplasm. Electronically Signed   By: Franchot Gallo M.D.   On: 03/28/2019 14:01   Ct Head Code Stroke Wo Contrast  Result Date: 03/28/2019 CLINICAL DATA:  Code stroke. Focal neuro deficit less than 6 hours. Left facial numbness. Left arm weakness. EXAM: CT HEAD WITHOUT CONTRAST TECHNIQUE: Contiguous axial images were obtained from the base of the skull through the vertex without intravenous contrast. COMPARISON:  None. FINDINGS: Brain: No evidence of acute infarction, hemorrhage, hydrocephalus, extra-axial collection or mass lesion/mass effect. Vascular: Negative for hyperdense vessel Skull: Negative Sinuses/Orbits: Negative Other: None ASPECTS ([place] Stroke Program Early CT Score) - Ganglionic level infarction (caudate, lentiform nuclei, internal capsule, insula, M1-M3 cortex): 7 - Supraganglionic infarction (M4-M6 cortex): 3 Total score (0-10 with 10 being normal): 10 IMPRESSION: 1. Negative CT head 2. ASPECTS is 10 Electronically Signed   By: Juanda Crumble  Carlis Abbott M.D.   On: 03/28/2019 13:33   2D Echocardiogram 1. Left ventricular ejection fraction, by visual estimation, is 60 to 65%. The left ventricle has normal function. Normal left ventricular size. There is no left ventricular hypertrophy.  2. Global right ventricle has normal systolic function.The right ventricular  size is normal. No increase in right ventricular wall thickness.  3. Left atrial size was normal.  4. Right atrial size was normal.  5. The mitral valve is normal in structure. No evidence of mitral valve regurgitation. No evidence of mitral stenosis.  6. The tricuspid valve is normal in structure. Tricuspid valve regurgitation was not visualized by color flow Doppler.  7. The aortic valve is normal in structure. Aortic valve regurgitation was not visualized by color flow Doppler. Structurally normal aortic valve, with no evidence of sclerosis or stenosis.  8. The pulmonic valve was normal in structure. Pulmonic valve regurgitation is not visualized by color flow Doppler.  9. TR signal is inadequate for assessing pulmonary artery systolic pressure. 10. The inferior vena cava is normal in size with greater than 50% respiratory variability, suggesting right atrial pressure of 3 mmHg.   PHYSICAL EXAM  Temp:  [97.7 F (36.5 C)-98.6 F (37 C)] 98.2 F (36.8 C) (10/07 0800) Pulse Rate:  [57-84] 65 (10/07 1000) Resp:  [10-22] 10 (10/07 1000) BP: (106-161)/(59-96) 139/70 (10/07 1000) SpO2:  [96 %-100 %] 100 % (10/07 1000) Weight:  [98 kg-98.5 kg] 98 kg (10/06 2000)  General - Well nourished, well developed, in no apparent distress.  Ophthalmologic - fundi not visualized due to noncooperation.  Cardiovascular - Regular rhythm and rate.  Mental Status -  Level of arousal and orientation to time, place, and person were intact. Language including expression, naming, repetition, comprehension was assessed and found intact. Fund of Knowledge was assessed and was intact.  Cranial Nerves II - XII - II - Visual field intact OU. III, IV, VI - Extraocular movements intact. V - Facial sensation intact bilaterally. VII - mild left nasolabial fold flattening. VIII - Hearing & vestibular intact bilaterally. X - Palate elevates symmetrically. XI - Chin turning & shoulder shrug intact  bilaterally. XII - Tongue protrusion intact.  Motor Strength - The patient's strength was normal in all extremities and pronator drift was absent.  Bulk was normal and fasciculations were absent.   Motor Tone - Muscle tone was assessed at the neck and appendages and was normal.  Reflexes - The patient's reflexes were symmetrical in all extremities and she had no pathological reflexes.  Sensory - Light touch, temperature/pinprick were assessed and were symmetrical.    Coordination - The patient had normal movements in the hands and feet with no ataxia or dysmetria.  Tremor was absent.  Gait and Station - deferred.   ASSESSMENT/PLAN Ms. KELI ATCHESON is a 63 y.o. female with history of HTN, DB presenting with L sided numbness and heaviness. Received tPA 03/28/2019 at 1336.   Stroke vs. TIA:  R brain location s/p tPA likely due to small vessel disease source  Code Stroke CT head No acute abnormality. ASPECTS 10.     CTA head & neck no ELVO. No sign stenosis in neck. Small L VA w/ stenosis at skull base. thyroid nodule.  MRI  pending  2D Echo EF 60-65%. No source of embolus   LDL 73  HgbA1c 7.7  SCDs for VTE prophylaxis  No antithrombotic prior to admission, now on No antithrombotic as within 24h of tPA administration.  Therapy recommendations:  pending  Disposition:  pending   Hypertension  Home meds:  None - lisinopril led to hypotension  Stable BP goal < 180/105 x 24h following tPA administration . Long-term BP goal normotensive  Hyperlipidemia  Home meds:  No statin  Now on lipitor 20 - not on higher dose d/t low LDL  LDL 73, goal < 70  Continue statin at discharge  Diabetes type II Uncontrolled  Home meds:  amaryl 4, metformin 1000 bid  HgbA1c 7.7, goal < 7.0  CBGs  SSI  Resume home meds on discharge  Close PCP follow up for better DM control  Other Stroke Risk Factors  Former Cigarette smoker, quit 40 yrs ago  Morbid Obesity, Body mass  index is 39.52 kg/m., recommend weight loss, diet and exercise as appropriate   Other Active Problems  Thyroid nodule - incidental finding on CTA - [name] pending. bx recommended as outpt   Hospital day # 1  This patient is critically ill due to stroke s/p tPA and hyperglycemia and at significant risk of neurological worsening, death form recurrent stroke and hemorrhagic conversion, DKA. This patient's care requires constant monitoring of vital signs, hemodynamics, respiratory and cardiac monitoring, review of multiple databases, neurological assessment, discussion with family, other specialists and medical decision making of high complexity. I spent 35 minutes of neurocritical care time in the care of this patient.  Rosalin Hawking, MD PhD Stroke Neurology 03/29/2019 10:48 AM    To contact Stroke Continuity provider, please refer to [email or web address] After hours, contact General Neurology

## 2019-03-29 NOTE — H&P (Signed)
Stroke admission history and physical   CC: Left-sided numbness and heaviness  History is obtained from: Patient, chart  HPI: [name] is a 63 y.o. female past medical history of hypertension currently not on medications due to suffering side effects from lisinopril that made her blood pressures go way down, diabetes, presented to the emergency room for sudden onset of left-sided numbness and heaviness. 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  She was at work with last known normal at noon when she had sudden onset of left face arm numbness and feeling of subjective heaviness. EMS was called.  Her blood pressure-systolic was in 782N.  Her blood sugar was 222. They noted no deficits other than subjective sensory difference. Due to the sudden onset of focal neurological deficit, they activated an acute code stroke and patient was seen and examined in the emergency room. She denied having such symptoms in the past. She denied any recent illnesses or sicknesses.  No fevers chills. She denied a headache.  She denies any visual changes.  She denied diplopia. Denied chest pain shortness of breath nausea vomiting fevers chills loss of appetite diarrhea constipation.   LKW: 12 PM on 03/28/2019  tpa given?: Yes remorbid modified Rankin scale (mRS): 0  ROS: ROS was performed and is negative except as noted in the HPI.       Past Medical History:  Diagnosis Date  . Anemia 2006   required transfusion post TAH/BSO 05/2005  . Arthritis   . Asthma   . Chronic cough   . Colon polyps    hyperplastic  . Diabetes mellitus without complication ([place])   . Diverticulosis   . GERD (gastroesophageal reflux disease)    on prilosec  . GLA deficiency ([place])   . Glaucoma of both eyes   . Hypertension   . Joint pain   . Osteoarthritis   . Pancreatitis   . Shortness of breath dyspnea         Family History  Problem Relation Age of Onset  . Emphysema  Mother        smoked  . Diabetes Mother   . Hypertension Mother   . Colon cancer Father        7-s  . Hypertension Father   . Heart disease Father   . Breast cancer Sister    Social History:   reports that she quit smoking about 40 years ago. Her smoking use included cigarettes. She has a 3.75 pack-year smoking history. She has never used smokeless tobacco. She reports that she does not drink alcohol or use drugs.  Medications No current facility-administered medications for this encounter.   Current Outpatient Medications:  .  blood glucose meter kit and supplies KIT, Dispense based on patient and insurance preference. Use up to four times daily as directed. (FOR ICD-9 250.00, 250.01)., Disp: 1 each, Rfl: 0 .  budesonide-formoterol (SYMBICORT) 80-4.5 MCG/ACT inhaler, Take 2 puffs first thing in am and then another 2 puffs about 12 hours later. (Patient taking differently: Inhale 2 puffs into the lungs 2 (two) times daily as needed (wheezing or shortness of breath). ), Disp: 1 Inhaler, Rfl: 11 .  glimepiride (AMARYL) 4 MG tablet, Take 0.5 tablets (2 mg total) by mouth daily with breakfast., Disp: 30 tablet, Rfl: 0 .  glucose blood test strip, Use as instructed, Disp: 100 each, Rfl: 1 .  HYDROcodone-acetaminophen (NORCO/VICODIN) 5-325 MG tablet, Take 1 tablet by mouth every 4 (four) hours as  needed. (Patient taking differently: Take 1 tablet by mouth every 4 (four) hours as needed for moderate pain. ), Disp: 10 tablet, Rfl: 0 .  MELATONIN PO, Take 1 tablet by mouth at bedtime as needed (sleep). , Disp: , Rfl:  .  omeprazole (PRILOSEC) 40 MG capsule, Take 40 mg by mouth daily. , Disp: , Rfl:  .  ondansetron (ZOFRAN ODT) 4 MG disintegrating tablet, Take 1 tablet (4 mg total) by mouth every 8 (eight) hours as needed. (Patient taking differently: Take 4 mg by mouth every 8 (eight) hours as needed for nausea or vomiting. ), Disp: 10 tablet, Rfl: 0 .  ONE TOUCH LANCETS MISC, 1 Package by  Does not apply route every morning., Disp: 100 each, Rfl: 0 .  travoprost, benzalkonium, (TRAVATAN) 0.004 % ophthalmic solution, Place 1 drop into both eyes at bedtime., Disp: , Rfl:   Exam: Current vital signs: BP (!) 157/89   Pulse 65   Temp 98.1 F (36.7 C) (Oral)   Resp 18   Ht _0  (1.575 m)   Wt 98.5 kg   SpO2 99%   BMI 39.72 kg/m  Vital signs in last 24 hours: Temp:  [98.1 F (36.7 C)] 98.1 F (36.7 C) (10/06 1400) Pulse Rate:  [65] 65 (10/06 1400) Resp:  [18] 18 (10/06 1400) BP: (157)/(89) 157/89 (10/06 1400) SpO2:  [99 %] 99 % (10/06 1400) Weight:  [98.5 kg] 98.5 kg (10/06 1401) GENERAL: Awake, alert in NAD HEENT: - Normocephalic and atraumatic, dry mm, no LN++, no Thyromegally LUNGS - Clear to auscultation bilaterally with no wheezes CV - S1S2 RRR, no m/r/g, equal pulses bilaterally. ABDOMEN - Soft, nontender, nondistended with normoactive BS Ext: warm, well perfused, intact peripheral pulses, no edema NEURO:  Mental Status: AA&Ox3 Language: speech is mildly dysarthric.  Naming, repetition, fluency, and comprehension intact. Cranial Nerves: PERRL. EOMI, visual fields full, no facial asymmetry, facial sensation intact to LT but subjectively feels numb, hearing intact, tongue/uvula/soft palate midline, normal sternocleidomastoid and trapezius muscle strength. No evidence of tongue atrophy or fibrillations Motor: No vertical drift in any of the four ext.  Slight difficulty in performing fine movements with the left hand. Tone: is normal and bulk is normal Sensation- Intact to light touch bilaterally-continues to report feeling of heaviness although there is no vertical drift. Coordination: FTN intact bilaterally Gait- deferred  NIHSS-2 for subjective sensory findings and dysarthria.   Labs I have reviewed labs in epic and the results pertinent to this consultation are:  CBC Labs (Brief)          Component Value Date/Time   WBC 7.9 06/19/2018 0740    RBC 4.71 06/19/2018 0740   HGB 13.6 06/19/2018 0740   HGB 13.4 08/02/2017 1035   HCT 42.9 06/19/2018 0740   HCT 40.7 08/02/2017 1035   PLT 298 06/19/2018 0740   MCV 91.1 06/19/2018 0740   MCV 86 08/02/2017 1035   MCH 28.9 06/19/2018 0740   MCHC 31.7 06/19/2018 0740   RDW 14.0 06/19/2018 0740   RDW 14.9 08/02/2017 1035   LYMPHSABS 1.9 06/19/2018 0740   LYMPHSABS 2.2 08/02/2017 1035   MONOABS 0.4 06/19/2018 0740   EOSABS 0.1 06/19/2018 0740   EOSABS 0.2 08/02/2017 1035   BASOSABS 0.0 06/19/2018 0740   BASOSABS 0.0 08/02/2017 1035      CMP     Labs (Brief)          Component Value Date/Time   NA 138 06/21/2018 1407   NA 141  08/02/2017 1035   K 3.9 06/21/2018 1407   CL 105 06/21/2018 1407   CO2 27 06/21/2018 1407   GLUCOSE 140 (H) 06/21/2018 1407   BUN 6 (L) 06/21/2018 1407   BUN 14 08/02/2017 1035   CREATININE 0.82 06/21/2018 1407   CALCIUM 9.1 06/21/2018 1407   PROT 7.5 06/19/2018 0740   PROT 6.9 08/02/2017 1035   ALBUMIN 4.0 06/19/2018 0740   ALBUMIN 4.4 08/02/2017 1035   AST 13 (L) 06/19/2018 0740   ALT 14 06/19/2018 0740   ALKPHOS 66 06/19/2018 0740   BILITOT 0.6 06/19/2018 0740   BILITOT 0.3 08/02/2017 1035   GFRNONAA >60 06/21/2018 1407   GFRAA >60 06/21/2018 1407       Imaging I have reviewed the images obtained:  CT-scan of the brain-no acute changes.  CTA head and neck with no LVO.  Assessment: 63 year old with hypertension noncompliant to medication presenting with sudden onset of left-sided numbness. Has a low NIH stroke scale -just subjective numbness and may be questionable subtle  Dysarthria. She did have some difficulty with fine motor skills in the left hand. She is employed full-time and is fully functional at baseline. I discussed the option of IV TPA for small strokes-suspecting that she has a small thalamic/capsular lacunar infarct.  I discussed the risks and benefits. She agreed to go ahead  with IV TPA understanding fully the risks and benefits.  Impression: Acute ischemic stroke-small vessel etiology Hypertensive emergency  Recommendations: Admit to ICU No aspirin or anticoagulation for 24 hours MRI brain without contrast at 24 hours Frequent neurochecks-per post TPA protocol Vitals per post TPA protocol Strict blood pressure control with systolic less than 396. 2D echocardiogram A1c, lipid panel IV normal saline 75 cc an hour Morning labs PT OT speech therapy N.p.o. until cleared by bedside swallow or formal swallow evaluation  Code Status: Full Code     THE FOLLOWING WERE PRESENT ON ADMISSION: CNS -  Acute Ischemic Stroke, hypertensive emergency    -- Amie Portland, MD [facility] Pager: [phone] If 7pm to 7am, please call on call as listed on AMION.  CRITICAL CARE ATTESTATION Performed by: Amie Portland, MD Total critical care time: 30 minutes Critical care time was exclusive of separately billable procedures and treating other patients and/or supervising APPs/Residents/Students Critical care was necessary to treat or prevent imminent or life-threatening deterioration due to possible acute ischemic stroke, IV thrombolytic (tPA) administration, hypertensive emergency This patient is critically ill and at significant risk for neurological worsening and/or death and care requires constant monitoring. Critical care was time spent personally by me on the following activities: development of treatment plan with patient and/or surrogate as well as nursing, discussions with consultants, evaluation of patient's response to treatment, examination of patient, obtaining history from patient or surrogate, ordering and performing treatments and interventions, ordering and review of laboratory studies, ordering and review of radiographic studies, pulse oximetry, re-evaluation of patient's condition, participation in multidisciplinary rounds and medical  decision making of high complexity in the care of this patient.

## 2019-03-29 NOTE — Progress Notes (Signed)
PT Cancellation Note  Patient Details Name: [name] MRN: [id number] DOB: [birth year]   Cancelled Treatment:    Reason Eval/Treat Not Completed: Active bedrest order    Barry Brunner, PT      [name] 03/29/2019, 8:09 AM

## 2019-03-29 NOTE — Progress Notes (Signed)
OT Cancellation Note  Patient Details Name: [name] MRN: [id number] DOB: Jan 04, 1956   Cancelled Treatment:    Reason Eval/Treat Not Completed: Active bedrest order(Will return as schedule allows.)  Lequita Halt, OT Student  03/29/2019, 7:50 AM

## 2019-03-29 NOTE — Progress Notes (Signed)
Pt. Arrived from 4N, report received from [place]. Patient is alert and oriented x4 with no pain. Patient belongings: purse and clothes. Patient educated on valuables policy. Pt. Sitting in chair with call bell in reach.

## 2019-03-29 NOTE — Progress Notes (Signed)
OT Cancellation Note  Patient Details Name: ALANDA KOENEMAN MRN: [id number] DOB: 1955/07/15   Cancelled Treatment:    Reason Eval/Treat Not Completed: OT screened, no needs identified, will sign off(Talked with pt, reports symptoms has resolved and has no concerned regarding ADLs. Talked with PT, reports that pt is moving well and is close to baseline. Will sign off. )  Lequita Halt, OT Student  03/29/2019, 4:59 PM

## 2019-03-30 ENCOUNTER — Encounter (HOSPITAL_COMMUNITY): Payer: Self-pay

## 2019-03-30 DIAGNOSIS — G459 Transient cerebral ischemic attack, unspecified: Principal | ICD-10-CM

## 2019-03-30 DIAGNOSIS — E119 Type 2 diabetes mellitus without complications: Secondary | ICD-10-CM

## 2019-03-30 DIAGNOSIS — E041 Nontoxic single thyroid nodule: Secondary | ICD-10-CM | POA: Diagnosis present

## 2019-03-30 DIAGNOSIS — E785 Hyperlipidemia, unspecified: Secondary | ICD-10-CM | POA: Diagnosis present

## 2019-03-30 LAB — GLUCOSE, CAPILLARY
Glucose-Capillary: 144 mg/dL — ABNORMAL HIGH (ref 70–99)
Glucose-Capillary: 215 mg/dL — ABNORMAL HIGH (ref 70–99)

## 2019-03-30 MED ORDER — INSULIN ASPART 100 UNIT/ML ~~LOC~~ SOLN
0.0000 [IU] | Freq: Three times a day (TID) | SUBCUTANEOUS | Status: DC
  Administered 2019-03-30: 5 [IU] via SUBCUTANEOUS
  Administered 2019-03-30: 2 [IU] via SUBCUTANEOUS

## 2019-03-30 MED ORDER — ATORVASTATIN CALCIUM 20 MG PO TABS: 20.0000 mg | ORAL_TABLET | Freq: Every day | ORAL | 2 refills | Status: AC

## 2019-03-30 MED ORDER — ASPIRIN 81 MG PO TBEC: 81.0000 mg | DELAYED_RELEASE_TABLET | Freq: Every day | ORAL | Status: AC

## 2019-03-30 MED ORDER — BUDESONIDE-FORMOTEROL FUMARATE 80-4.5 MCG/ACT IN AERO: 2.0000 | INHALATION_SPRAY | Freq: Two times a day (BID) | RESPIRATORY_TRACT | Status: DC | PRN

## 2019-03-30 NOTE — Plan of Care (Signed)
  Problem: Education: Goal: Knowledge of disease or condition will improve Outcome: Progressing Goal: Knowledge of secondary prevention will improve Outcome: Progressing Goal: Knowledge of patient specific risk factors addressed and post discharge goals established will improve Outcome: Progressing Goal: Individualized Educational Video(s) Outcome: Progressing   Problem: Coping: Goal: Will verbalize positive feelings about self Outcome: Progressing Goal: Will identify appropriate support needs Outcome: Progressing   Problem: Health Behavior/Discharge Planning: Goal: Ability to manage health-related needs will improve Outcome: Progressing   Problem: Self-Care: Goal: Ability to participate in self-care as condition permits will improve Outcome: Progressing Goal: Verbalization of feelings and concerns over difficulty with self-care will improve Outcome: Progressing Goal: Ability to communicate needs accurately will improve Outcome: Progressing   Problem: Nutrition: Goal: Risk of aspiration will decrease Outcome: Progressing Goal: Dietary intake will improve Outcome: Progressing   Problem: Ischemic Stroke/TIA Tissue Perfusion: Goal: Complications of ischemic stroke/TIA will be minimized Outcome: Progressing   Problem: Education: Goal: Knowledge of General Education information will improve Description: Including pain rating scale, medication(s)/side effects and non-pharmacologic comfort measures Outcome: Progressing   Problem: Health Behavior/Discharge Planning: Goal: Ability to manage health-related needs will improve Outcome: Progressing   Problem: Clinical Measurements: Goal: Ability to maintain clinical measurements within normal limits will improve Outcome: Progressing Goal: Will remain free from infection Outcome: Progressing Goal: Diagnostic test results will improve Outcome: Progressing Goal: Respiratory complications will improve Outcome: Progressing Goal:  Cardiovascular complication will be avoided Outcome: Progressing   Problem: Activity: Goal: Risk for activity intolerance will decrease Outcome: Progressing   Problem: Nutrition: Goal: Adequate nutrition will be maintained Outcome: Progressing   Problem: Coping: Goal: Level of anxiety will decrease Outcome: Progressing   Problem: Elimination: Goal: Will not experience complications related to bowel motility Outcome: Progressing Goal: Will not experience complications related to urinary retention Outcome: Progressing   Problem: Pain Managment: Goal: General experience of comfort will improve Outcome: Progressing   Problem: Safety: Goal: Ability to remain free from injury will improve Outcome: Progressing   Problem: Skin Integrity: Goal: Risk for impaired skin integrity will decrease Outcome: Progressing   Elye Harmsen, BSN, RN 

## 2019-03-30 NOTE — Discharge Summary (Addendum)
Stroke Discharge Summary  Patient ID: [name]   MRN: [id number]      DOB: 1956-06-14  Date of Admission: 03/28/2019 Date of Discharge: 03/30/2019  Attending Physician:  Rosalin Hawking, MD, Stroke MD Consultant(s):    None  Patient's PCP:  Antony Contras, MD  DISCHARGE DIAGNOSIS:  Principal Problem:   Stroke-like episode s/p IV tPA Active Problems:   Diabetes mellitus without complication ([place])   Essential hypertension, benign   Severe obesity (BMI >= 40) ([facility])   TIA (transient ischemic attack)   Hyperlipidemia   Thyroid nodule   Past Medical History:  Diagnosis Date  . Anemia 2006   required transfusion post TAH/BSO 05/2005  . Arthritis   . Asthma   . Chronic cough   . Colon polyps    hyperplastic  . Diabetes mellitus without complication ([place])   . Diverticulosis   . GERD (gastroesophageal reflux disease)    on prilosec  . GLA deficiency ([place])   . Glaucoma of both eyes   . Hypertension   . Joint pain   . Osteoarthritis   . Pancreatitis   . Shortness of breath dyspnea    Past Surgical History:  Procedure Laterality Date  . ABDOMINAL HYSTERECTOMY    . BREAST EXCISIONAL BIOPSY Right 1990  . COLONOSCOPY N/A 08/22/2013   Procedure: COLONOSCOPY;  Surgeon: Irene Shipper, MD;  Location: [facility] ENDOSCOPY;  Service: Endoscopy;  Laterality: N/A;  . ESOPHAGOGASTRODUODENOSCOPY N/A 08/22/2013   Procedure: ESOPHAGOGASTRODUODENOSCOPY (EGD);  Surgeon: Irene Shipper, MD;  Location: [facility] ENDOSCOPY;  Service: Endoscopy;  Laterality: N/A;  . ESOPHAGOGASTRODUODENOSCOPY (EGD) WITH PROPOFOL N/A 08/19/2015   Procedure: ESOPHAGOGASTRODUODENOSCOPY (EGD) WITH PROPOFOL;  Surgeon: Irene Shipper, MD;  Location: [facility] ENDOSCOPY;  Service: Endoscopy;  Laterality: N/A;  . HERNIA REPAIR    . HYSTEROSCOPY W/D&C  01/2005   for uterine fibroids.   . INSERTION OF MESH N/A 08/24/2013   Procedure: INSERTION OF MESH;  Surgeon: Edward Jolly, MD;  Location: [place];  Service: General;  Laterality: N/A;  .  JOINT REPLACEMENT Right   . LIPOMA EXCISION Left 06/21/2015   Procedure: EXCISION OF LEFT SCALP LIPOMA;  Surgeon: Johnathan Hausen, MD;  Location: [place];  Service: General;  Laterality: Left;  . PANNICULECTOMY N/A 08/24/2013   Procedure: PANNICULECTOMY;  Surgeon: Edward Jolly, MD;  Location: [place];  Service: General;  Laterality: N/A;  . TOTAL ABDOMINAL HYSTERECTOMY W/ BILATERAL SALPINGOOPHORECTOMY  05/2005  . TOTAL HIP ARTHROPLASTY Right 12/12/2013   Procedure: RIGHT TOTAL HIP ARTHROPLASTY ANTERIOR APPROACH;  Surgeon: Mcarthur Rossetti, MD;  Location: [place];  Service: Orthopedics;  Laterality: Right;  . VENTRAL HERNIA REPAIR N/A 08/24/2013   Procedure: HERNIA REPAIR VENTRAL ADULT;  Surgeon: Edward Jolly, MD;  Location: [place];  Service: General;  Laterality: N/A;    Allergies as of 03/30/2019      Reactions   Diclofenac    Hives    Penicillins Hives   Has patient had a PCN reaction causing immediate rash, facial/tongue/throat swelling, SOB or lightheadedness with hypotension: Yes Has patient had a PCN reaction causing severe rash involving mucus membranes or skin necrosis: Yes Has patient had a PCN reaction that required hospitalization No Has patient had a PCN reaction occurring within the last 10 years: No. If all of the above answers are "NO", then may proceed with Cephalosporin use.      Medication List    STOP taking these medications  blood glucose meter kit and supplies Kit   ONE TOUCH LANCETS Misc     TAKE these medications   aspirin 81 MG EC tablet Take 1 tablet (81 mg total) by mouth daily. Start taking on: March 31, 2019   atorvastatin 20 MG tablet Commonly known as: LIPITOR Take 1 tablet (20 mg total) by mouth daily at 6 PM.   budesonide-formoterol 80-4.5 MCG/ACT inhaler Commonly known as: Symbicort Inhale 2 puffs into the lungs 2 (two) times daily as needed (wheezing or shortness of breath).   glimepiride 4 MG tablet Commonly  known as: AMARYL Take 0.5 tablets (2 mg total) by mouth daily with breakfast. What changed: how much to take   glucose blood test strip Use as instructed   HYDROcodone-acetaminophen 5-325 MG tablet Commonly known as: NORCO/VICODIN Take 1 tablet by mouth every 4 (four) hours as needed.   MELATONIN PO Take 1 tablet by mouth at bedtime as needed (sleep).   metFORMIN 1000 MG tablet Commonly known as: GLUCOPHAGE Take 1,000 mg by mouth 2 (two) times daily with a meal.   ondansetron 4 MG disintegrating tablet Commonly known as: Zofran ODT Take 1 tablet (4 mg total) by mouth every 8 (eight) hours as needed.       LABORATORY STUDIES CBC    Component Value Date/Time   WBC 7.4 03/28/2019 1326   RBC 4.68 03/28/2019 1326   HGB 14.6 03/28/2019 1328   HGB 13.4 08/02/2017 1035   HCT 43.0 03/28/2019 1328   HCT 40.7 08/02/2017 1035   PLT 263 03/28/2019 1326   MCV 90.2 03/28/2019 1326   MCV 86 08/02/2017 1035   MCH 30.6 03/28/2019 1326   MCHC 33.9 03/28/2019 1326   RDW 13.2 03/28/2019 1326   RDW 14.9 08/02/2017 1035   LYMPHSABS 3.5 03/28/2019 1326   LYMPHSABS 2.2 08/02/2017 1035   MONOABS 0.6 03/28/2019 1326   EOSABS 0.4 03/28/2019 1326   EOSABS 0.2 08/02/2017 1035   BASOSABS 0.1 03/28/2019 1326   BASOSABS 0.0 08/02/2017 1035   CMP    Component Value Date/Time   NA 139 03/28/2019 1328   NA 141 08/02/2017 1035   K 4.8 03/28/2019 1328   CL 105 03/28/2019 1328   CO2 24 03/28/2019 1326   GLUCOSE 153 (H) 03/28/2019 1328   BUN 18 03/28/2019 1328   BUN 14 08/02/2017 1035   CREATININE 0.90 03/28/2019 1328   CALCIUM 9.5 03/28/2019 1326   PROT 7.1 03/28/2019 1326   PROT 6.9 08/02/2017 1035   ALBUMIN 3.9 03/28/2019 1326   ALBUMIN 4.4 08/02/2017 1035   AST 19 03/28/2019 1326   ALT 22 03/28/2019 1326   ALKPHOS 86 03/28/2019 1326   BILITOT 0.7 03/28/2019 1326   BILITOT 0.3 08/02/2017 1035   GFRNONAA >60 03/28/2019 1326   GFRAA >60 03/28/2019 1326   COAGS Lab Results   Component Value Date   INR 1.0 03/28/2019   Lipid Panel    Component Value Date/Time   CHOL 129 03/29/2019 0324   CHOL 135 08/02/2017 1035   TRIG 50 03/29/2019 0324   HDL 46 03/29/2019 0324   HDL 54 08/02/2017 1035   CHOLHDL 2.8 03/29/2019 0324   VLDL 10 03/29/2019 0324   LDLCALC 73 03/29/2019 0324   LDLCALC 68 08/02/2017 1035   HgbA1C  Lab Results  Component Value Date   HGBA1C 7.7 (H) 03/29/2019   Urinalysis    Component Value Date/Time   COLORURINE STRAW (A) 03/28/2019 1515   APPEARANCEUR CLEAR 03/28/2019 1515  LABSPEC 1.024 03/28/2019 1515   PHURINE 6.0 03/28/2019 1515   GLUCOSEU NEGATIVE 03/28/2019 1515   HGBUR SMALL (A) 03/28/2019 1515   BILIRUBINUR NEGATIVE 03/28/2019 1515   KETONESUR NEGATIVE 03/28/2019 1515   PROTEINUR NEGATIVE 03/28/2019 1515   UROBILINOGEN 1.0 11/12/2014 1944   NITRITE NEGATIVE 03/28/2019 1515   LEUKOCYTESUR NEGATIVE 03/28/2019 1515   Urine Drug Screen     Component Value Date/Time   LABOPIA NONE DETECTED 03/28/2019 1515   COCAINSCRNUR NONE DETECTED 03/28/2019 1515   LABBENZ NONE DETECTED 03/28/2019 1515   AMPHETMU NONE DETECTED 03/28/2019 1515   THCU NONE DETECTED 03/28/2019 1515   LABBARB NONE DETECTED 03/28/2019 1515    Alcohol Level    Component Value Date/Time   ETH <10 03/28/2019 1326     SIGNIFICANT DIAGNOSTIC STUDIES Ct Angio Head W Or [name] Contrast  Result Date: 03/28/2019 CLINICAL DATA:  Focal neuro deficit. Left facial numbness. Left arm heaviness. EXAM: CT ANGIOGRAPHY HEAD AND NECK TECHNIQUE: Multidetector CT imaging of the head and neck was performed using the standard protocol during bolus administration of intravenous contrast. Multiplanar CT image reconstructions and MIPs were obtained to evaluate the vascular anatomy. Carotid stenosis measurements (when applicable) are obtained utilizing NASCET criteria, using the distal internal carotid diameter as the denominator. CONTRAST:  37m OMNIPAQUE IOHEXOL 350 MG/ML SOLN  COMPARISON:  CT head 03/28/2019 FINDINGS: CTA NECK FINDINGS Aortic arch: Bovine arch. Proximal great vessels widely patent. Normal aortic arch. Right carotid system: Normal right carotid. Negative for stenosis or dissection Left carotid system: Normal left carotid. Negative for stenosis or dissection. Vertebral arteries: Hypoplastic posterior circulation due to fetal origin of the posterior cerebral arteries bilaterally. Small vertebral arteries bilaterally, both patent to the basilar without significant disease. Skeleton: No acute skeletal abnormality. Other neck: Incidental left thyroid nodule measuring 21 x 30 mm. Upper chest: Lung apices clear bilaterally. Review of the MIP images confirms the above findings CTA HEAD FINDINGS Anterior circulation: Cavernous carotid widely patent bilaterally. Anterior and middle cerebral arteries patent bilaterally without stenosis or large vessel occlusion. Posterior circulation: Hypoplastic posterior circulation. Right vertebral artery dominant and patent to the basilar. Small left vertebral artery with atherosclerotic plaque and moderate to severe stenosis at the skull base. Minimal contribution to the basilar. Small basilar which supplies posterior cerebral arteries bilaterally. Fetal origin of the posterior cerebral artery bilaterally is without significant stenosis. Venous sinuses: Normal venous enhancement Anatomic variants: None. Review of the MIP images confirms the above findings IMPRESSION: 1. No significant carotid   stenosis in the neck. 2. 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 Right vertebral artery dominant and widely patent. 3. Negative for intracranial large vessel occlusion. 4. Incidental thyroid nodule 21 x 30 mm. Recommend thyroid ultrasound and consideration of biopsy to rule out neoplasm. Electronically Signed   By: CFranchot GalloM.D.   On: 03/28/2019 14:01   Ct Angio Neck W Or [name] Contrast  Result Date:  03/28/2019 CLINICAL DATA:  Focal neuro deficit. Left facial numbness. Left arm heaviness. EXAM: CT ANGIOGRAPHY HEAD AND NECK TECHNIQUE: Multidetector CT imaging of the head and neck was performed using the standard protocol during bolus administration of intravenous contrast. Multiplanar CT image reconstructions and MIPs were obtained to evaluate the vascular anatomy. Carotid stenosis measurements (when applicable) are obtained utilizing NASCET criteria, using the distal internal carotid diameter as the denominator. CONTRAST:  717mOMNIPAQUE IOHEXOL 350 MG/ML SOLN COMPARISON:  CT head 03/28/2019 FINDINGS: CTA NECK FINDINGS Aortic arch:  Bovine arch. Proximal great vessels widely patent. Normal aortic arch. Right carotid system: Normal right carotid. Negative for stenosis or dissection Left carotid system: Normal left carotid. Negative for stenosis or dissection. Vertebral arteries: Hypoplastic posterior circulation due to fetal origin of the posterior cerebral arteries bilaterally. Small vertebral arteries bilaterally, both patent to the basilar without significant disease. Skeleton: No acute skeletal abnormality. Other neck: Incidental left thyroid nodule measuring 21 x 30 mm. Upper chest: Lung apices clear bilaterally. Review of the MIP images confirms the above findings CTA HEAD FINDINGS Anterior circulation: Cavernous carotid widely patent bilaterally. Anterior and middle cerebral arteries patent bilaterally without stenosis or large vessel occlusion. Posterior circulation: Hypoplastic posterior circulation. Right vertebral artery dominant and patent to the basilar. Small left vertebral artery with atherosclerotic plaque and moderate to severe stenosis at the skull base. Minimal contribution to the basilar. Small basilar which supplies posterior cerebral arteries bilaterally. Fetal origin of the posterior cerebral artery bilaterally is without significant stenosis. Venous sinuses: Normal venous enhancement  Anatomic variants: None. Review of the MIP images confirms the above findings IMPRESSION: 1. No significant carotid   stenosis in the neck. 2. 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 Right vertebral artery dominant and widely patent. 3. Negative for intracranial large vessel occlusion. 4. Incidental thyroid nodule 21 x 30 mm. Recommend thyroid ultrasound and consideration of biopsy to rule out neoplasm. Electronically Signed   By: Franchot Gallo M.D.   On: 03/28/2019 14:01   [name] Contrast  Result Date: 03/29/2019 CLINICAL DATA:  63 year old female code stroke presentation yesterday status post tPA. Left side symptoms. EXAM: MRI HEAD WITHOUT CONTRAST TECHNIQUE: Multiplanar, multiecho pulse sequences of the brain and surrounding structures were obtained without intravenous contrast. COMPARISON:  CT head and CTA head and neck yesterday. FINDINGS: Brain: No restricted diffusion to suggest acute infarction. No midline shift, mass effect, evidence of mass lesion, ventriculomegaly, extra-axial collection or acute intracranial hemorrhage. Cervicomedullary junction and pituitary are within normal limits. [place] Cables and white matter signal is within normal limits for age throughout the brain. No cortical encephalomalacia or chronic cerebral blood products. Normal cerebral volume. Vascular: Major intracranial vascular flow voids are preserved, diminutive vertebrobasilar system as seen yesterday. Skull and upper cervical spine: Negative visible cervical spine aside from diffusely decreased T1 marrow signal which is nonspecific. The clivus and occipital condyles are relatively spared. No destructive osseous lesion identified. No suspicious bone mineralization on the CTs yesterday. Sinuses/Orbits: Mild chronic left lamina papyracea fracture. Negative orbits. Paranasal sinuses and mastoids remain well pneumatized. Other: Scalp and face soft tissues appear negative. IMPRESSION: 1. No  acute intracranial abnormality. Normal for age noncontrast MRI appearance of the brain. 2. Diffusely decreased T1 marrow signal is nonspecific. Although this can be caused by marrow infiltrative processes, the most common causes include anemia, smoking, and obesity. Electronically Signed   By: Genevie Ann M.D.   On: 03/29/2019 12:47   US Thyroid  Result Date: 03/29/2019 CLINICAL DATA:  Thyroid nodule thyroid nodule seen on recent CT EXAM: THYROID ULTRASOUND TECHNIQUE: Ultrasound examination of the thyroid gland and adjacent soft tissues was performed. COMPARISON:  None. FINDINGS: Parenchymal Echotexture: Normal Isthmus: 0.5 cm Right lobe: 3.5 x 1.9 x 1.3 cm Left lobe: 4.9 x 3.1 x 2.2 cm _________________________________________________________ Estimated total number of nodules >/= 1 cm: 1 Number of spongiform nodules >/=  2 cm not described below (TR1): 0 Number of mixed cystic and solid nodules >/= 1.5 cm  not described below ([place]): 0 _________________________________________________________ Nodule # 1: Location: Left; Mid Maximum size: 2.5 cm; Other 2 dimensions: 2.4 x 3 cm Composition: solid/almost completely solid (2) Echogenicity: isoechoic (1) Shape: not taller-than-wide (0) Margins: smooth (0) Echogenic foci: none (0) ACR TI-RADS total points: 3. ACR TI-RADS risk category: TR3 (3 points). ACR TI-RADS recommendations: **Given size (>/= 2.5 cm) and appearance, fine needle aspiration of this mildly suspicious nodule should be considered based on TI-RADS criteria. _________________________________________________________ IMPRESSION: Single 2.5 cm thyroid nodule in the left mid thyroid gland that meet criteria for FNA. The above is in keeping with the ACR TI-RADS recommendations - J Am Coll Radiol 2017;14:587-595. Electronically Signed   By: Constance Holster M.D.   On: 03/29/2019 10:39   Ct Head Code Stroke [name] Contrast  Result Date: 03/28/2019 CLINICAL DATA:  Code stroke. Focal neuro deficit less than 6  hours. Left facial numbness. Left arm weakness. EXAM: CT HEAD WITHOUT CONTRAST TECHNIQUE: Contiguous axial images were obtained from the base of the skull through the vertex without intravenous contrast. COMPARISON:  None. FINDINGS: Brain: No evidence of acute infarction, hemorrhage, hydrocephalus, extra-axial collection or mass lesion/mass effect. Vascular: Negative for hyperdense vessel Skull: Negative Sinuses/Orbits: Negative Other: None ASPECTS ([place] Stroke Program Early CT Score) - Ganglionic level infarction (caudate, lentiform nuclei, internal capsule, insula, M1-M3 cortex): 7 - Supraganglionic infarction (M4-M6 cortex): 3 Total score (0-10 with 10 being normal): 10 IMPRESSION: 1. Negative CT head 2. ASPECTS is 10 Electronically Signed   By: Franchot Gallo M.D.   On: 03/28/2019 13:33    2D Echocardiogram 1. Left ventricular ejection fraction, by visual estimation, is 60 to 65%. The left ventricle has normal function. Normal left ventricular size. There is no left ventricular hypertrophy. 2. Global right ventricle has normal systolic function.The right ventricular size is normal. No increase in right ventricular wall thickness. 3. Left atrial size was normal. 4. Right atrial size was normal. 5. The mitral valve is normal in structure. No evidence of mitral valve regurgitation. No evidence of mitral stenosis. 6. The tricuspid valve is normal in structure. Tricuspid valve regurgitation was not visualized by color flow Doppler. 7. The aortic valve is normal in structure. Aortic valve regurgitation was not visualized by color flow Doppler. Structurally normal aortic valve, with no evidence of sclerosis or stenosis. 8. The pulmonic valve was normal in structure. Pulmonic valve regurgitation is not visualized by color flow Doppler. 9. TR signal is inadequate for assessing pulmonary artery systolic pressure. 10. The inferior vena cava is normal in size with greater than 50% respiratory  variability, suggesting right atrial pressure of 3 mmHg.     HISTORY OF PRESENT ILLNESS Sanyiah E Edwardsis a 63 y.o.femalepast medical history of hypertension currently not on medications due to suffering side effects from lisinopril that made her blood pressures go way down, diabetes,presented to the emergency room for sudden onset of left-sided numbness and heaviness. 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 She was at work with last known normal at noon when she had sudden onset of left face arm numbness and feeling of subjective heaviness. EMS was called. Her blood pressure-systolic was in 202R. Her blood sugar was 222. They noted no deficits other than subjective sensory difference. Due to the sudden onset of focal neurological deficit, they activated an acute code stroke and patient was seen and examined in the emergency room. She denied having such symptoms in the past. She denied any recent illnesses or sicknesses. No  fevers chills. She denied a headache. She denies any visual changes. She denied diplopia. Denied chest pain shortness of breath nausea vomiting fevers chills loss of appetite diarrhea constipation. She was administered tPA and admitted to the neuro ICU.   HOSPITAL COURSE [name] is a 63 y.o. female with history of HTN, DB presenting with L sided numbness and heaviness. Received tPA 03/28/2019 at 1336.   TIA R brain s/p tPA likely due to small vessel disease source  Code Stroke CT head No acute abnormality. ASPECTS 10.     CTA head & neck no ELVO. No sign stenosis in neck. Small L VA w/ stenosis at skull base. thyroid nodule.  MRI  no acute abnormality. Decreased T1 marrow signal.   2D Echo EF 60-65%. No source of embolus   LDL 73  HgbA1c 7.7  No antithrombotic prior to admission, now on aspirin 81 mg daily. Continue at d/c  Therapy recommendations:  no therapy needs  Disposition:  return home  Hypertension  Home meds:  None  - lisinopril led to hypotension  Stable  BP goal normotensive  Hyperlipidemia  Home meds:  No statin  Now on lipitor 20 - not on higher dose d/t low LDL  LDL 73, goal < 70  Continue statin at discharge  Diabetes type II Uncontrolled  Home meds:  amaryl 4, metformin 1000 bid  HgbA1c 7.7, goal < 7.0  CBGs  SSI  Resume home meds on discharge  Close PCP follow up for better DM control  Other Stroke Risk Factors  Former Cigarette smoker, quit 40 yrs ago  Morbid Obesity, Body mass index is 39.52 kg/m., recommend weight loss, diet and exercise as appropriate   Other Active Problems  Thyroid nodule - incidental finding on CTA - [name] 2.5cm thyroid L mid thyroid nodule that meets criteria for FNA. bx recommended as outpt     DISCHARGE EXAM Blood pressure 127/80, pulse 79, temperature 98.4 F (36.9 C), temperature source Oral, resp. rate 16, height '5\' 2"'  (1.575 m), weight 98 kg, SpO2 100 %. General - Well nourished, well developed, in no apparent distress.  Ophthalmologic - fundi not visualized due to noncooperation.  Cardiovascular - Regular rhythm and rate.  Mental Status -  Level of arousal and orientation to time, place, and person were intact. Language including expression, naming, repetition, comprehension was assessed and found intact. Fund of Knowledge was assessed and was intact.  Cranial Nerves II - XII - II - Visual field intact OU. III, IV, VI - Extraocular movements intact. V - Facial sensation intact bilaterally. VII - mild left nasolabial fold flattening. VIII - Hearing & vestibular intact bilaterally. X - Palate elevates symmetrically. XI - Chin turning & shoulder shrug intact bilaterally. XII - Tongue protrusion intact.  Motor Strength - The patient's strength was normal in all extremities and pronator drift was absent.  Bulk was normal and fasciculations were absent.   Motor Tone - Muscle tone was assessed at the neck and appendages  and was normal.  Reflexes - The patient's reflexes were symmetrical in all extremities and she had no pathological reflexes.  Sensory - Light touch, temperature/pinprick were assessed and were symmetrical.    Coordination - The patient had normal movements in the hands and feet with no ataxia or dysmetria.  Tremor was absent.  Gait and Station - deferred.  Discharge Diet   Heart healthy/carb modified liquids  DISCHARGE PLAN  Disposition:  Return home  No therapy needs  aspirin  81 mg daily for stroke prevention  Ongoing stroke risk factor control by Primary Care Physician at time of discharge  Follow-up Antony Contras, MD in 2 weeks.  Follow-up in [facility] in 4 weeks, office to schedule an appointment.   OP bx thyroid nodule - f/u with PCP  45 minutes were spent preparing discharge.  Rosalin Hawking, MD PhD Stroke Neurology 03/30/2019 8:12 PM

## 2019-03-30 NOTE — Evaluation (Signed)
Speech Language Pathology Evaluation Patient Details Name: [name] MRN: [id number] DOB: [birth year] Today's Date: 03/30/2019 Time: 1043-1100 SLP Time Calculation (min) (ACUTE ONLY): 17 min  Problem List:  Patient Active Problem List   Diagnosis Date Noted  . Acute ischemic stroke ([place]) 03/28/2019  . GERD (gastroesophageal reflux disease) 06/19/2018  . Constipation 06/19/2018  . Other fatigue 08/02/2017  . Shortness of breath on exertion 08/02/2017  . Abdominal pain, epigastric   . Abnormal CT of the abdomen   . Pancreatitis, acute   . Acute pancreatitis 08/13/2015  . UTI (lower urinary tract infection) 08/13/2015  . Severe obesity (BMI >= 40) ([place]) 01/23/2015  . Sinusitis, chronic 01/08/2015  . Cough variant asthma 01/03/2015  . Arthritis of right hip 12/12/2013  . 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 Benign neoplasm of colon 08/22/2013  . Special screening for malignant neoplasms, colon 08/22/2013  . Abdominal pain 08/19/2013  . Diabetes mellitus without complication ([place]) [id number]  . Essential hypertension, benign 08/19/2013  . Ventral hernia 08/19/2013   Past Medical History:  Past Medical History:  Diagnosis Date  . Anemia 2006   required transfusion post TAH/BSO 05/2005  . Arthritis   . Asthma   . Chronic cough   . Colon polyps    hyperplastic  . Diabetes mellitus without complication ([place])   . Diverticulosis   . GERD (gastroesophageal reflux disease)    on prilosec  . GLA deficiency ([place])   . Glaucoma of both eyes   . Hypertension   . Joint pain   . Osteoarthritis   . Pancreatitis   . Shortness of breath dyspnea    Past Surgical History:  Past Surgical History:  Procedure Laterality Date  . ABDOMINAL HYSTERECTOMY    . BREAST EXCISIONAL BIOPSY Right 1990  . COLONOSCOPY N/A 08/22/2013   Procedure: COLONOSCOPY;  Surgeon: Irene Shipper, MD;  Location: [facility] ENDOSCOPY;  Service: Endoscopy;  Laterality: N/A;  . ESOPHAGOGASTRODUODENOSCOPY  N/A 08/22/2013   Procedure: ESOPHAGOGASTRODUODENOSCOPY (EGD);  Surgeon: Irene Shipper, MD;  Location: [facility] ENDOSCOPY;  Service: Endoscopy;  Laterality: N/A;  . ESOPHAGOGASTRODUODENOSCOPY (EGD) WITH PROPOFOL N/A 08/19/2015   Procedure: ESOPHAGOGASTRODUODENOSCOPY (EGD) WITH PROPOFOL;  Surgeon: Irene Shipper, MD;  Location: [facility] ENDOSCOPY;  Service: Endoscopy;  Laterality: N/A;  . HERNIA REPAIR    . HYSTEROSCOPY W/D&C  01/2005   for uterine fibroids.   . INSERTION OF MESH N/A 08/24/2013   Procedure: INSERTION OF MESH;  Surgeon: Edward Jolly, MD;  Location: [place];  Service: General;  Laterality: N/A;  . JOINT REPLACEMENT Right   . LIPOMA EXCISION Left 06/21/2015   Procedure: EXCISION OF LEFT SCALP LIPOMA;  Surgeon: Johnathan Hausen, MD;  Location: [place];  Service: General;  Laterality: Left;  . PANNICULECTOMY N/A 08/24/2013   Procedure: PANNICULECTOMY;  Surgeon: Edward Jolly, MD;  Location: [place];  Service: General;  Laterality: N/A;  . TOTAL ABDOMINAL HYSTERECTOMY W/ BILATERAL SALPINGOOPHORECTOMY  05/2005  . TOTAL HIP ARTHROPLASTY Right 12/12/2013   Procedure: RIGHT TOTAL HIP ARTHROPLASTY ANTERIOR APPROACH;  Surgeon: Mcarthur Rossetti, MD;  Location: [place];  Service: Orthopedics;  Laterality: Right;  . VENTRAL HERNIA REPAIR N/A 08/24/2013   Procedure: HERNIA REPAIR VENTRAL ADULT;  Surgeon: Edward Jolly, MD;  Location: [place];  Service: General;  Laterality: N/A;   HPI:  Ms [name], 62y/f, presented to ED with left face/ arm numbness adn heaviness. PMH: hypertension and diabetes.   Assessment /  Plan / Recommendation Clinical Impression  Patient present with normal speech, language and cognitive abilities. She communicates at a conversational level. MOCA was given to assess language and cognition. Pt scored 29/30 (26 or greater being [facility]). No further ST services are warrented at this time. ST to sign off.     SLP Assessment  SLP Recommendation/Assessment:  Patient does not need any further Speech Lanaguage Pathology Services    Follow Up Recommendations  None               SLP Evaluation Cognition  Overall Cognitive Status: Within Functional Limits for tasks assessed Arousal/Alertness: Awake/alert Orientation Level: Oriented X4 Attention: Focused;Sustained;Selective Focused Attention: Appears intact Sustained Attention: Appears intact Selective Attention: Appears intact Memory: Appears intact Immediate Memory Recall: Sock;Blue;Bed Memory Recall Sock: Without Cue Memory Recall Blue: Without Cue Memory Recall Bed: Without Cue Awareness: Appears intact Problem Solving: Appears intact Executive Function: Reasoning;Sequencing;Organizing Reasoning: Appears intact Sequencing: Appears intact Organizing: Appears intact Safety/Judgment: Appears intact       Comprehension  Auditory Comprehension Overall Auditory Comprehension: Appears within functional limits for tasks assessed Yes/No Questions: Within Functional Limits Commands: Within Functional Limits Conversation: Complex Visual Recognition/Discrimination Discrimination: Within Function Limits Reading Comprehension Reading Status: Within funtional limits    Expression Expression Primary Mode of Expression: Verbal Verbal Expression Overall Verbal Expression: Appears within functional limits for tasks assessed Initiation: No impairment Level of Generative/Spontaneous Verbalization: Conversation Repetition: No impairment Naming: No impairment Pragmatics: No impairment Written Expression Dominant Hand: Right   Oral / Motor  Oral Motor/Sensory Function Overall Oral Motor/Sensory Function: Within functional limits Motor Speech Overall Motor Speech: Appears within functional limits for tasks assessed Respiration: Within functional limits Phonation: Normal Resonance: Within functional limits Articulation: Within functional limitis Intelligibility: Intelligible Motor  Planning: Witnin functional limits Motor Speech Errors: Not applicable   GO                   Charlynne Cousins Estelle Greenleaf, MA, CCC-SLP 03/30/2019 12:47 PM

## 2019-03-30 NOTE — TOC Transition Note (Signed)
Transition of Care [facility]) - CM/SW Discharge Note   Patient Details  Name: [name] MRN: [id number] Date of Birth: 03-25-1956  [facility] CM/SW Contact:  Pollie Friar, RN Phone Number: 03/30/2019, 3:40 PM   Clinical Narrative:    Pt discharging home with self care. No f/u per PT. No DME needs.  Pt has transportation home.   Final next level of care: Home/Self Care Barriers to Discharge: No Barriers Identified   Patient Goals and CMS Choice        Discharge Placement                       Discharge Plan and Services   Discharge Planning Services: CM Consult                                 Social Determinants of Health (SDOH) Interventions     Readmission Risk Interventions No flowsheet data found.

## 2019-03-30 NOTE — TOC Initial Note (Addendum)
Transition of Care [facility]) - Initial/Assessment Note    Patient Details  Name: [name] MRN: [id number] Date of Birth: 08/08/1955  Transition of Care [facility]) CM/SW Contact:    Pollie Friar, RN Phone Number: 03/30/2019, 11:45 AM  Clinical Narrative:                 No f/u per PT and no DME needs.  Pt denies any issues with home medications and no transportation issues. TOC following for d/c needs.   Expected Discharge Plan: Home/Self Care Barriers to Discharge: No Barriers Identified   Patient Goals and CMS Choice        Expected Discharge Plan and Services Expected Discharge Plan: Home/Self Care   Discharge Planning Services: CM Consult   Living arrangements for the past 2 months: Single Family Home                                      Prior Living Arrangements/Services Living arrangements for the past 2 months: Single Family Home Lives with:: Spouse(spouse is an amputee--patient assists him at home) Patient language and need for interpreter reviewed:: Yes(no needs) Do you feel safe going back to the place where you live?: Yes      Need for Family Participation in Patient Care: No (Comment) Care giver support system in place?: No (comment)(spouse can provide supervision but no physical support)--Son and grand daughter are able to provide assist.   Criminal Activity/Legal Involvement Pertinent to Current Situation/Hospitalization: No - Comment as needed  Activities of Daily Living Home Assistive Devices/Equipment: CBG Meter, Eyeglasses, Blood pressure cuff, Dentures (specify type)(partial dentures at home) ADL Screening (condition at time of admission) Patient's cognitive ability adequate to safely complete daily activities?: Yes Is the patient deaf or have difficulty hearing?: No Does the patient have difficulty seeing, even when wearing glasses/contacts?: No Does the patient have difficulty concentrating, remembering, or making decisions?: No Patient  able to express need for assistance with ADLs?: Yes Does the patient have difficulty dressing or bathing?: No Independently performs ADLs?: Yes (appropriate for developmental age) Does the patient have difficulty walking or climbing stairs?: No Weakness of Legs: None Weakness of Arms/Hands: None  Permission Sought/Granted                  Emotional Assessment Appearance:: Appears stated age Attitude/Demeanor/Rapport: Engaged Affect (typically observed): Accepting, Pleasant Orientation: : Oriented to Self, Oriented to Place, Oriented to  Time, Oriented to Situation   Psych Involvement: No (comment)  Admission diagnosis:  Acute ischemic stroke [facility]) [I63.9] Patient Active Problem List   Diagnosis Date Noted  . Acute ischemic stroke ([name]) 03/28/2019  . GERD (gastroesophageal reflux disease) 06/19/2018  . Constipation 06/19/2018  . Other fatigue 08/02/2017  . Shortness of breath on exertion 08/02/2017  . Abdominal pain, epigastric   . Abnormal CT of the abdomen   . Pancreatitis, acute   . Acute pancreatitis 08/13/2015  . UTI (lower urinary tract infection) 08/13/2015  . Severe obesity (BMI >= 40) ([place]) 01/23/2015  . Sinusitis, chronic 01/08/2015  . Cough variant asthma 01/03/2015  . Arthritis of right hip 12/12/2013  . 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 Benign neoplasm of colon 08/22/2013  . Special screening for malignant neoplasms, colon 08/22/2013  . Abdominal pain 08/19/2013  . Diabetes mellitus without complication ([place]) [id number]  . Essential hypertension, benign 08/19/2013  . Ventral hernia 08/19/2013  PCP:  Antony Contras, MD Pharmacy:   [facility] [address]), [place] [id number] [place] [address] Phone: [phone] Fax: [phone]  [id number] [place], [place]. [place] [address] Phone: [phone] Fax: [phone]  [id number] [place] [address] Phone: [phone] Fax: [phone]  [facility] [phone] - [place], [place] [address] [place] [address] Phone: [phone] Fax: [phone]     Social Determinants of Health (SDOH) Interventions    Readmission Risk Interventions No flowsheet data found.

## 2019-03-30 NOTE — Progress Notes (Signed)
Patient is discharging home. All discharge paperwork went over with patient, all questions and concerns addressed. All belongings sent with patient. IVs taken out, tele removed. Family called for transport home. Patient to be taken down in wheelchair. [place]

## 2019-04-11 ENCOUNTER — Other Ambulatory Visit: Payer: Self-pay | Admitting: Family Medicine

## 2019-04-12 ENCOUNTER — Other Ambulatory Visit: Payer: Self-pay | Admitting: Family Medicine

## 2019-04-12 DIAGNOSIS — E041 Nontoxic single thyroid nodule: Secondary | ICD-10-CM

## 2019-04-24 ENCOUNTER — Other Ambulatory Visit: Payer: Self-pay

## 2019-04-24 ENCOUNTER — Encounter (INDEPENDENT_AMBULATORY_CARE_PROVIDER_SITE_OTHER): Payer: Self-pay | Admitting: Bariatrics

## 2019-04-24 ENCOUNTER — Encounter: Payer: Self-pay | Admitting: Bariatrics

## 2019-04-24 ENCOUNTER — Ambulatory Visit (INDEPENDENT_AMBULATORY_CARE_PROVIDER_SITE_OTHER): Payer: BC Managed Care – PPO | Admitting: Bariatrics

## 2019-04-24 VITALS — BP 138/74 | HR 61 | Temp 98.5°F | Ht 62.0 in | Wt 210.0 lb

## 2019-04-24 DIAGNOSIS — R0602 Shortness of breath: Secondary | ICD-10-CM

## 2019-04-24 DIAGNOSIS — Z9189 Other specified personal risk factors, not elsewhere classified: Secondary | ICD-10-CM

## 2019-04-24 DIAGNOSIS — I10 Essential (primary) hypertension: Secondary | ICD-10-CM

## 2019-04-24 DIAGNOSIS — R5383 Other fatigue: Secondary | ICD-10-CM

## 2019-04-24 DIAGNOSIS — E559 Vitamin D deficiency, unspecified: Secondary | ICD-10-CM

## 2019-04-24 DIAGNOSIS — Z6838 Body mass index (BMI) 38.0-38.9, adult: Secondary | ICD-10-CM

## 2019-04-24 DIAGNOSIS — E7849 Other hyperlipidemia: Secondary | ICD-10-CM

## 2019-04-24 DIAGNOSIS — E119 Type 2 diabetes mellitus without complications: Secondary | ICD-10-CM

## 2019-04-24 DIAGNOSIS — Z1331 Encounter for screening for depression: Secondary | ICD-10-CM | POA: Diagnosis not present

## 2019-04-24 DIAGNOSIS — Z8673 Personal history of transient ischemic attack (TIA), and cerebral infarction without residual deficits: Secondary | ICD-10-CM

## 2019-04-24 NOTE — Progress Notes (Signed)
.  Office: [phone]  /  Fax: [phone]   HPI:   Chief Complaint: OBESITY  [name] (MR# [id number]) is a 63 y.o. female who presents on 04/24/2019 for obesity evaluation and treatment. Current BMI is Body mass index is 38.41 kg/m.Marland Kitchen She was previously in the program and saw Dr. Leafy Ro. [name] has struggled with obesity for years and has been unsuccessful in either losing weight or maintaining long term weight loss. [name] states she is currently in the action stage of change and ready to dedicate time achieving and maintaining a healthier weight.  [name] states her family eats meals together she thinks her family will eat healthier with  her her desired weight loss is 85 lbs. she started gaining weight after the birth of her last child her heaviest weight ever was 225 lbs. she states that she is a picky eater she does crave sweets she snacks frequently in the evenings she skips meals frequently she is frequently drinking liquids with calories she frequently makes poor food choices   Fatigue [name] has some fatigue.This has worsened with weight gain and has not worsened recently. Her indirect calorimetry in February 2019 was 1536. Her indirect calorimetry today was 1601. [name] admits to daytime somnolence and she denies waking up still tired. Patient is at risk for obstructive sleep apnea. Patent has a history of symptoms of daytime fatigue, morning headache and hypertension. Patient generally gets 5 or 6 hours of sleep per night, and states they generally have restless sleep. Snoring is present. Apneic episodes are not present. Epworth Sleepiness Score is 3  Dyspnea on exertion [name] notes increasing shortness of breath with exercising and seems to be worsening over time with weight gain. She notes getting out of breath sooner with activity than she used to. This has not gotten worse recently. [name] denies orthopnea.  Diabetes II without complications, non-insulin [name] has a  diagnosis of diabetes type II. She is taking Amaryl and Metformin. Her last A1c was at 7.7. [name] states fasting BGs range between 160 and 170's and 2 hour post prandial BGs average 125 and she denies any hypoglycemic episodes. She is attempting to work on intensive lifestyle modifications including diet, exercise, and weight loss to help control her blood glucose levels.  Hyperlipidemia [name] has hyperlipidemia and she is taking Lipitor. She is attempting to improve her cholesterol levels with intensive lifestyle modification including a low saturated fat diet, exercise and weight loss. She denies any chest pain or myalgias.  Hypertension [name] is a 63 y.o. female with hypertension. She is not on medications at this time. Her blood pressure is at 138/74. [name] denies chest pain. She is working weight loss to help control her blood pressure with the goal of decreasing her risk of heart attack and stroke. [name] blood pressure is currently controlled.  Vitamin D deficiency [name] has a diagnosis of vitamin D deficiency. [name] is not currently taking vit D and she denies nausea, vomiting or muscle weakness.  At risk for osteopenia and osteoporosis [name] is at higher risk of osteopenia and osteoporosis due to vitamin D deficiency.   History of TIA [name] has a history of TIA and she is taking ASA (new), and cholesterol medication.  Depression Screen [name] Food and Mood (modified PHQ-9) score was  Depression screen PHQ 2/9 04/24/2019  Decreased Interest 1  Down, Depressed, Hopeless 2  PHQ - 2 Score 3  Altered sleeping 2  Tired, decreased energy 2  Change in  appetite 1  Feeling bad or failure about yourself  0  Trouble concentrating 1  Moving slowly or fidgety/restless 0  Suicidal thoughts 0  PHQ-9 Score 9  Difficult doing work/chores Not difficult at all    ASSESSMENT AND PLAN:  Other fatigue - Plan: T3, T4, free, TSH, CANCELED: EKG 12-Lead  Shortness of breath on  exertion  Type 2 diabetes mellitus without complication, without long-term current use of insulin (HCC) - Plan: C-peptide, Microalbumin / creatinine urine ratio, Insulin, random  Other hyperlipidemia  Essential hypertension  Vitamin D deficiency - Plan: VITAMIN D 25 Hydroxy (Vit-D Deficiency, Fractures)  History of TIA (transient ischemic attack)  Depression screening  At risk for osteoporosis  Class 2 severe obesity with serious comorbidity and body mass index (BMI) of 38.0 to 38.9 in adult, unspecified obesity type (HCC)  PLAN:  Fatigue [name] was informed that her fatigue may be related to obesity, depression or many other causes. Labs will be ordered, and in the meanwhile [name] has agreed to work on diet, exercise and weight loss to help with fatigue. Proper sleep hygiene was discussed including the need for 7-8 hours of quality sleep each night. A sleep study was not ordered based on symptoms and Epworth score.  Dyspnea on exertion [name]'s shortness of breath appears to be obesity related and exercise induced. She has agreed to work on weight loss and gradually increase activities to treat her exercise induced shortness of breath. If [name] follows our instructions and loses weight without improvement of her shortness of breath, we will plan to refer to pulmonology. We will monitor this condition regularly. [name] agrees to this plan.  Diabetes II without complications, non-insulin [name] has been given extensive diabetes education by myself today including ideal fasting and post-prandial blood glucose readings, individual ideal Hgb A1c goals and hypoglycemia prevention. We discussed the importance of good blood sugar control to decrease the likelihood of diabetic complications such as nephropathy, neuropathy, limb loss, blindness, coronary artery disease, and death. We discussed the importance of intensive lifestyle modification including diet, exercise and weight loss as the first line  treatment for diabetes. [name] agrees to continue her diabetes medications and will follow up at the agreed upon time.  Hyperlipidemia [name] was informed of the American Heart Association Guidelines emphasizing intensive lifestyle modifications as the first line treatment for hyperlipidemia. We discussed many lifestyle modifications today in depth, and Aldena will work on decreasing saturated fats such as fatty red meat, butter and many fried foods. She will also increase vegetables and lean protein in her diet and work on exercise and weight loss efforts. [name] will continue Lipitor and she will follow up as directed.  Hypertension We discussed sodium restriction (no added salt), working on healthy weight loss, and a regular exercise program as the means to achieve improved blood pressure control. [name] agreed with this plan and agreed to follow up as directed. We will continue to monitor her blood pressure as well as her progress with the above lifestyle modifications. She will watch for signs of hypotension as she continues her lifestyle modifications.  Vitamin D Deficiency [name] was informed that low vitamin D levels contributes to fatigue and are associated with obesity, breast, and colon cancer. We will check vitamin D level today and she will follow up for routine testing of vitamin D, at least 2-3 times per year. She was informed of the risk of over-replacement of vitamin D and agrees to not increase her dose unless she discusses this  with [name] first.  At risk for osteopenia and osteoporosis Quenesha was given extended  (15 minutes) osteoporosis prevention counseling today. [name] is at risk for osteopenia and osteoporosis due to her vitamin D deficiency. She was encouraged to take her vitamin D and follow her higher calcium diet and increase strengthening exercise to help strengthen her bones and decrease her risk of osteopenia and osteoporosis.  History of TIA [name] will follow up with her PCP and  she will follow up with our clinic in 2 to 3 weeks.  Depression Screen [name] had a mildly positive depression screening. Depression is commonly associated with obesity and often results in emotional eating behaviors. We will monitor this closely and work on CBT to help improve the non-hunger eating patterns. Referral to Psychology may be required if no improvement is seen as she continues in our clinic.  Obesity [name] is currently in the action stage of change and her goal is to continue with weight loss efforts She has agreed to alternate between the Category 2 plan and the Pescatarian eating plan [name] has been instructed to work up to a goal of 150 minutes of combined cardio and strengthening exercise per week for weight loss and overall health benefits. We discussed the following Behavioral Modification Strategies today: increase H2O intake to at least 64 ounces, no skipping meals, keeping healthy foods in the home, increasing lean protein intake, decreasing simple carbohydrates, increasing vegetables, decrease eating out, decrease sweets, work on meal planning and easy cooking plans and ways to avoid boredom eating Albanie will stop all sugary drinks.  Dellamae has agreed to follow up with our clinic in 2 to 3 weeks. She was informed of the importance of frequent follow up visits to maximize her success with intensive lifestyle modifications for her multiple health conditions. She was informed we would discuss her lab results at her next visit unless there is a critical issue that needs to be addressed sooner. Anayeli agreed to keep her next visit at the agreed upon time to discuss these results.  ALLERGIES: Allergies  Allergen Reactions  . Diclofenac     Hives   . Penicillins Hives    Has patient had a PCN reaction causing immediate rash, facial/tongue/throat swelling, SOB or lightheadedness with hypotension: Yes Has patient had a PCN reaction causing severe rash involving mucus membranes or  skin necrosis: Yes Has patient had a PCN reaction that required hospitalization No Has patient had a PCN reaction occurring within the last 10 years: No. If all of the above answers are "NO", then may proceed with Cephalosporin use.     MEDICATIONS: Current Outpatient Medications on File Prior to Visit  Medication Sig Dispense Refill  . aspirin EC 81 MG EC tablet Take 1 tablet (81 mg total) by mouth daily.    Marland Kitchen atorvastatin (LIPITOR) 20 MG tablet Take 1 tablet (20 mg total) by mouth daily at 6 PM. 30 tablet 2  . glimepiride (AMARYL) 4 MG tablet Take 0.5 tablets (2 mg total) by mouth daily with breakfast. (Patient taking differently: Take 4 mg by mouth daily with breakfast. ) 30 tablet 0  . glucose blood test strip Use as instructed 100 each 1  . MELATONIN PO Take 1 tablet by mouth at bedtime as needed (sleep).     . metFORMIN (GLUCOPHAGE) 1000 MG tablet Take 1,000 mg by mouth 2 (two) times daily with a meal.    . budesonide-formoterol (SYMBICORT) 80-4.5 MCG/ACT inhaler Inhale 2 puffs into the lungs 2 (two)  times daily as needed (wheezing or shortness of breath).    Marland Kitchen HYDROcodone-acetaminophen (NORCO/VICODIN) 5-325 MG tablet Take 1 tablet by mouth every 4 (four) hours as needed. (Patient not taking: Reported on 03/28/2019) 10 tablet 0  . ondansetron (ZOFRAN ODT) 4 MG disintegrating tablet Take 1 tablet (4 mg total) by mouth every 8 (eight) hours as needed. (Patient not taking: Reported on 03/28/2019) 10 tablet 0   No current facility-administered medications on file prior to visit.     PAST MEDICAL HISTORY: Past Medical History:  Diagnosis Date  . Anemia 2006   required transfusion post TAH/BSO 05/2005  . Arthritis   . Asthma   . Chronic cough   . Colon polyps    hyperplastic  . Diabetes mellitus without complication ([place])   . Diverticulosis   . GERD (gastroesophageal reflux disease)    on prilosec  . GLA deficiency ([place])   . Glaucoma of both eyes   . Hypertension   . Joint pain    . Osteoarthritis   . Pancreatitis   . Shortness of breath dyspnea   . Vitamin D deficiency     PAST SURGICAL HISTORY: Past Surgical History:  Procedure Laterality Date  . ABDOMINAL HYSTERECTOMY    . BREAST EXCISIONAL BIOPSY Right 1990  . COLONOSCOPY N/A 08/22/2013   Procedure: COLONOSCOPY;  Surgeon: Irene Shipper, MD;  Location: [facility] ENDOSCOPY;  Service: Endoscopy;  Laterality: N/A;  . ESOPHAGOGASTRODUODENOSCOPY N/A 08/22/2013   Procedure: ESOPHAGOGASTRODUODENOSCOPY (EGD);  Surgeon: Irene Shipper, MD;  Location[facility] ENDOSCOPY;  Service: Endoscopy;  Laterality: N/A;  . ESOPHAGOGASTRODUODENOSCOPY (EGD) WITH PROPOFOL N/A 08/19/2015   Procedure: ESOPHAGOGASTRODUODENOSCOPY (EGD) WITH PROPOFOL;  Surgeon: Irene Shipper, MD;  Location: [facility] ENDOSCOPY;  Service: Endoscopy;  Laterality: N/A;  . HERNIA REPAIR    . HYSTEROSCOPY W/D&C  01/2005   for uterine fibroids.   . INSERTION OF MESH N/A 08/24/2013   Procedure: INSERTION OF MESH;  Surgeon: Edward Jolly, MD;  Location: [place];  Service: General;  Laterality: N/A;  . JOINT REPLACEMENT Right   . LIPOMA EXCISION Left 06/21/2015   Procedure: EXCISION OF LEFT SCALP LIPOMA;  Surgeon: Johnathan Hausen, MD;  Location: [place];  Service: General;  Laterality: Left;  . PANNICULECTOMY N/A 08/24/2013   Procedure: PANNICULECTOMY;  Surgeon: Edward Jolly, MD;  Location: [place];  Service: General;  Laterality: N/A;  . TOTAL ABDOMINAL HYSTERECTOMY W/ BILATERAL SALPINGOOPHORECTOMY  05/2005  . TOTAL HIP ARTHROPLASTY Right 12/12/2013   Procedure: RIGHT TOTAL HIP ARTHROPLASTY ANTERIOR APPROACH;  Surgeon: Mcarthur Rossetti, MD;  Location: [place];  Service: Orthopedics;  Laterality: Right;  . VENTRAL HERNIA REPAIR N/A 08/24/2013   Procedure: HERNIA REPAIR VENTRAL ADULT;  Surgeon: Edward Jolly, MD;  Location: [place];  Service: General;  Laterality: N/A;    SOCIAL HISTORY: Social History   Tobacco Use  . Smoking status: Former Smoker     Packs/day: 0.25    Years: 15.00    Pack years: 3.75    Types: Cigarettes    Quit date: 06/22/1978    Years since quitting: 40.8  . Smokeless tobacco: Never Used  Substance Use Topics  . Alcohol use: No    Alcohol/week: 0.0 standard drinks  . Drug use: No    FAMILY HISTORY: Family History  Problem Relation Age of Onset  . Emphysema Mother        smoked  . Diabetes Mother   . Hypertension Mother   . Colon cancer  Father        7-s  . Hypertension Father   . Heart disease Father   . High Cholesterol Father   . Breast cancer Sister     ROS: Review of Systems  Constitutional: Positive for malaise/fatigue.  HENT: Positive for sinus pain.   Eyes: Positive for redness.       Positive for Wear Glasses or Contacts  Respiratory: Positive for shortness of breath (on exertion).   Cardiovascular: Negative for chest pain and orthopnea.  Gastrointestinal: Negative for nausea and vomiting.  Musculoskeletal: Negative for myalgias.       Positive for Muscle Stiffness  Endo/Heme/Allergies:       Negative for hypoglycemia  Psychiatric/Behavioral: Positive for depression. The patient has insomnia.        Positive for Stress    PHYSICAL EXAM: Blood pressure 138/74, pulse 61, temperature 98.5 F (36.9 C), height 5\' 2"  (1.575 m), weight 210 lb (95.3 kg), SpO2 100 %. Body mass index is 38.41 kg/m. Physical Exam Vitals signs reviewed.  Constitutional:      Appearance: Normal appearance. She is well-developed. She is obese.  HENT:     Head: Normocephalic and atraumatic.     Nose: Nose normal.  Eyes:     General: No scleral icterus.    Extraocular Movements: Extraocular movements intact.  Neck:     Musculoskeletal: Normal range of motion and neck supple.     Thyroid: No thyromegaly.  Cardiovascular:     Rate and Rhythm: Normal rate and regular rhythm.  Pulmonary:     Effort: Pulmonary effort is normal. No respiratory distress.  Abdominal:     Palpations: Abdomen is soft.      Tenderness: There is no abdominal tenderness.  Musculoskeletal: Normal range of motion.     Comments: Range of Motion normal in all 4 extremities  Skin:    General: Skin is warm and dry.  Neurological:     Mental Status: She is alert and oriented to person, place, and time.     Coordination: Coordination normal.  Psychiatric:        Mood and Affect: Mood normal.        Behavior: Behavior normal.     RECENT LABS AND TESTS: BMET    Component Value Date/Time   NA 139 03/28/2019 1328   NA 141 08/02/2017 1035   K 4.8 03/28/2019 1328   CL 105 03/28/2019 1328   CO2 24 03/28/2019 1326   GLUCOSE 153 (H) 03/28/2019 1328   BUN 18 03/28/2019 1328   BUN 14 08/02/2017 1035   CREATININE 0.90 03/28/2019 1328   CALCIUM 9.5 03/28/2019 1326   GFRNONAA >60 03/28/2019 1326   GFRAA >60 03/28/2019 1326   Lab Results  Component Value Date   HGBA1C 7.7 (H) 03/29/2019   Lab Results  Component Value Date   INSULIN 17.4 08/02/2017   CBC    Component Value Date/Time   WBC 7.4 03/28/2019 1326   RBC 4.68 03/28/2019 1326   HGB 14.6 03/28/2019 1328   HGB 13.4 08/02/2017 1035   HCT 43.0 03/28/2019 1328   HCT 40.7 08/02/2017 1035   PLT 263 03/28/2019 1326   MCV 90.2 03/28/2019 1326   MCV 86 08/02/2017 1035   MCH 30.6 03/28/2019 1326   MCHC 33.9 03/28/2019 1326   RDW 13.2 03/28/2019 1326   RDW 14.9 08/02/2017 1035   LYMPHSABS 3.5 03/28/2019 1326   LYMPHSABS 2.2 08/02/2017 1035   MONOABS 0.6 03/28/2019 1326   EOSABS  0.4 03/28/2019 1326   EOSABS 0.2 08/02/2017 1035   BASOSABS 0.1 03/28/2019 1326   BASOSABS 0.0 08/02/2017 1035   Iron/TIBC/Ferritin/ %Sat No results found for: IRON, TIBC, FERRITIN, IRONPCTSAT Lipid Panel     Component Value Date/Time   CHOL 129 03/29/2019 0324   CHOL 135 08/02/2017 1035   TRIG 50 03/29/2019 0324   HDL 46 03/29/2019 0324   HDL 54 08/02/2017 1035   CHOLHDL 2.8 03/29/2019 0324   VLDL 10 03/29/2019 0324   LDLCALC 73 03/29/2019 0324   LDLCALC 68  08/02/2017 1035   Hepatic Function Panel     Component Value Date/Time   PROT 7.1 03/28/2019 1326   PROT 6.9 08/02/2017 1035   ALBUMIN 3.9 03/28/2019 1326   ALBUMIN 4.4 08/02/2017 1035   AST 19 03/28/2019 1326   ALT 22 03/28/2019 1326   ALKPHOS 86 03/28/2019 1326   BILITOT 0.7 03/28/2019 1326   BILITOT 0.3 08/02/2017 1035      Component Value Date/Time   TSH 2.000 08/02/2017 1035   Vitamin D  Ref. Range 01/06/2018 09:17  Vitamin D, 25-Hydroxy Latest Ref Range: 30.0 - 100.0 ng/mL 33.1    INDIRECT CALORIMETER done today shows a VO2 of 230 and a REE of 1601. Her calculated basal metabolic rate is 0000000 thus her basal metabolic rate is better than expected.       OBESITY BEHAVIORAL INTERVENTION VISIT  Today's visit was # 9   Starting weight: 204 lbs Starting date: 08/02/2017 Today's weight : 210 lbs Today's date: 04/24/2019 Total lbs lost to date: 0    04/24/2019  Height 5\' 2"  (1.575 m)  Weight 210 lb (95.3 kg)  BMI (Calculated) 38.4  BLOOD PRESSURE - SYSTOLIC 0000000  BLOOD PRESSURE - DIASTOLIC 74  Waist Measurement  46 inches   Body Fat % 47.2 %  Total Body Water (lbs) 74.2 lbs  RMR 1601    ASK: We discussed the diagnosis of obesity with [name] today and Wilmarie agreed to give [name] permission to discuss obesity behavioral modification therapy today.  ASSESS: Florean has the diagnosis of obesity and her BMI today is 38.4 Leather is in the action stage of change   ADVISE: Kessa was educated on the multiple health risks of obesity as well as the benefit of weight loss to improve her health. She was advised of the need for long term treatment and the importance of lifestyle modifications to improve her current health and to decrease her risk of future health problems.  AGREE: Multiple dietary modification options and treatment options were discussed and  Jazzman agreed to follow the recommendations documented in the above note.  ARRANGE: Kahlynn was educated on the  importance of frequent visits to treat obesity as outlined per CMS and USPSTF guidelines and agreed to schedule her next follow up appointment today.   Corey Skains, am acting as Location manager for [employer]. Owens Shark, DO  I have reviewed the above documentation for accuracy and completeness, and I agree with the above. -Jearld Lesch, DO

## 2019-04-25 ENCOUNTER — Encounter (INDEPENDENT_AMBULATORY_CARE_PROVIDER_SITE_OTHER): Payer: Self-pay | Admitting: Bariatrics

## 2019-04-25 LAB — INSULIN, RANDOM: INSULIN: 16.2 u[IU]/mL (ref 2.6–24.9)

## 2019-04-25 LAB — C-PEPTIDE: C-Peptide: 3.3 ng/mL (ref 1.1–4.4)

## 2019-04-25 LAB — T4, FREE: Free T4: 1.27 ng/dL (ref 0.82–1.77)

## 2019-04-25 LAB — T3: T3, Total: 142 ng/dL (ref 71–180)

## 2019-04-25 LAB — MICROALBUMIN / CREATININE URINE RATIO
Creatinine, Urine: 121.9 mg/dL
Microalb/Creat Ratio: 6 mg/g creat (ref 0–29)
Microalbumin, Urine: 7.7 ug/mL

## 2019-04-25 LAB — VITAMIN D 25 HYDROXY (VIT D DEFICIENCY, FRACTURES): Vit D, 25-Hydroxy: 25.7 ng/mL — ABNORMAL LOW (ref 30.0–100.0)

## 2019-04-25 LAB — TSH: TSH: 3.12 u[IU]/mL (ref 0.450–4.500)

## 2019-04-26 ENCOUNTER — Other Ambulatory Visit (HOSPITAL_COMMUNITY)
Admission: RE | Admit: 2019-04-26 | Discharge: 2019-04-26 | Disposition: A | Discharge status: Home / Self Care | Payer: BC Managed Care – PPO | Source: Ambulatory Visit | Attending: Family Medicine | Admitting: Family Medicine

## 2019-04-26 ENCOUNTER — Encounter (INDEPENDENT_AMBULATORY_CARE_PROVIDER_SITE_OTHER): Payer: Self-pay | Admitting: Bariatrics

## 2019-04-26 ENCOUNTER — Ambulatory Visit
Admission: RE | Admit: 2019-04-26 | Discharge: 2019-04-26 | Disposition: A | Discharge status: Home / Self Care | Payer: BC Managed Care – PPO | Source: Ambulatory Visit | Attending: Family Medicine | Admitting: Family Medicine

## 2019-04-26 DIAGNOSIS — E041 Nontoxic single thyroid nodule: Secondary | ICD-10-CM | POA: Insufficient documentation

## 2019-04-26 DIAGNOSIS — E559 Vitamin D deficiency, unspecified: Secondary | ICD-10-CM | POA: Insufficient documentation

## 2019-04-28 LAB — CYTOLOGY - NON PAP

## 2019-05-08 ENCOUNTER — Other Ambulatory Visit: Payer: Self-pay

## 2019-05-08 ENCOUNTER — Ambulatory Visit (INDEPENDENT_AMBULATORY_CARE_PROVIDER_SITE_OTHER): Payer: BC Managed Care – PPO | Admitting: Bariatrics

## 2019-05-08 VITALS — BP 123/82 | HR 68 | Temp 98.5°F | Ht 62.0 in | Wt 209.0 lb

## 2019-05-08 DIAGNOSIS — Z6838 Body mass index (BMI) 38.0-38.9, adult: Secondary | ICD-10-CM

## 2019-05-08 DIAGNOSIS — E559 Vitamin D deficiency, unspecified: Secondary | ICD-10-CM

## 2019-05-08 DIAGNOSIS — Z9189 Other specified personal risk factors, not elsewhere classified: Secondary | ICD-10-CM

## 2019-05-08 DIAGNOSIS — E119 Type 2 diabetes mellitus without complications: Secondary | ICD-10-CM | POA: Diagnosis not present

## 2019-05-10 ENCOUNTER — Encounter (INDEPENDENT_AMBULATORY_CARE_PROVIDER_SITE_OTHER): Payer: Self-pay | Admitting: Bariatrics

## 2019-05-10 ENCOUNTER — Other Ambulatory Visit (INDEPENDENT_AMBULATORY_CARE_PROVIDER_SITE_OTHER): Payer: Self-pay | Admitting: Bariatrics

## 2019-05-10 MED ORDER — VITAMIN D (ERGOCALCIFEROL) 1.25 MG (50000 UNIT) PO CAPS: 50000.0000 [IU] | ORAL_CAPSULE | ORAL | 0 refills | Status: DC

## 2019-05-10 NOTE — Progress Notes (Signed)
Office: [phone]  /  Fax: [phone]   HPI:   Chief Complaint: OBESITY [name] is here to discuss her progress with her obesity treatment plan. She is on the Pescatarian eating plan and Category 2 and is following her eating plan approximately 30-40% of the time. She states she is exercising 0 minutes 0 times per week. [name] is down 1 lb, but states that she was bored with the diet even though she only followed the plan 30-40%. She liked the dinners. She reports doing better with her water intake.  Her weight is 209 lb (94.8 kg) today and has had a weight loss of 1 pound over a period of 2 weeks since her last visit. She has lost 0 lbs since starting treatment with [name].  Vitamin D deficiency [name] has a diagnosis of Vitamin D deficiency. Last Vitamin D 25.7 on 04/24/2019. She is currently taking prescription Vit D and denies nausea, vomiting or muscle weakness.  At risk for osteopenia and osteoporosis [name] is at higher risk of osteopenia and osteoporosis due to Vitamin D deficiency.   Diabetes II [name] has a diagnosis of diabetes type II. [name] states fasting blood sugars range between 110 and 120's with 2-hour postprandials 130 to 140's. Last A1c was 7.7 on 03/29/2019 with an insulin of 16.2 on 04/24/2019. She has been working on intensive lifestyle modifications including diet, exercise, and weight loss to help control her blood glucose levels.  ASSESSMENT AND PLAN:  Vitamin D deficiency - Plan: Vitamin D, Ergocalciferol, (DRISDOL) 1.25 MG (50000 UT) CAPS capsule  Type 2 diabetes mellitus without complication, without long-term current use of insulin (HCC)  At risk for osteoporosis  Class 2 severe obesity with serious comorbidity and body mass index (BMI) of 38.0 to 38.9 in adult, unspecified obesity type ([place])  PLAN:  Vitamin D Deficiency [name] was informed that low Vitamin D levels contributes to fatigue and are associated with obesity, breast, and colon cancer. She agrees  to continue to take prescription Vit D @ 50,000 IU every week #4 with 0 refills and will follow-up for routine testing of Vitamin D, at least 2-3 times per year. She was informed of the risk of over-replacement of Vitamin D and agrees to not increase her dose unless she discusses this with [name] first. [name] agrees to follow-up with our clinic in 2-3 weeks.  At risk for osteopenia and osteoporosis [name] was given extended  (15 minutes) osteoporosis prevention counseling today. [name] is at risk for osteopenia and osteoporosis due to her Vitamin D deficiency. She was encouraged to take her Vitamin D and follow her higher calcium diet and increase strengthening exercise to help strengthen her bones and decrease her risk of osteopenia and osteoporosis.  Diabetes II [name] has been given extensive diabetes education by myself today including ideal fasting and post-prandial blood glucose readings, individual ideal HgA1c goals  and hypoglycemia prevention. We discussed the importance of good blood sugar control to decrease the likelihood of diabetic complications such as nephropathy, neuropathy, limb loss, blindness, coronary artery disease, and death. We discussed the importance of intensive lifestyle modification including diet, exercise and weight loss as the first line treatment for diabetes. [name] will continue her medications and will continue to track her blood sugars.  Obesity [name] is currently in the action stage of change. As such, her goal is to continue with weight loss efforts. She has agreed to follow the Pescatarian eating plan and Category 2 with additional choices for breakfast and lunch. [name]  will work on meal planning and will stop drinking all soda/sugary drinks. She was given handout on Thanksgiving. [name] has been instructed to work up to a goal of 150 minutes of combined cardio and strengthening exercise per week for weight loss and overall health benefits. We discussed the following  Behavioral Modification Strategies today: increasing lean protein intake, decreasing simple carbohydrates, increasing vegetables, increase H20 intake, decrease eating out, no skipping meals, work on meal planning and easy cooking plans, keeping healthy foods in the home, and planning for success.  [name] has agreed to follow-up with our clinic in 2-3 weeks. She was informed of the importance of frequent follow-up visits to maximize her success with intensive lifestyle modifications for her multiple health conditions.  ALLERGIES: Allergies  Allergen Reactions  . Diclofenac     Hives   . Penicillins Hives    Has patient had a PCN reaction causing immediate rash, facial/tongue/throat swelling, SOB or lightheadedness with hypotension: Yes Has patient had a PCN reaction causing severe rash involving mucus membranes or skin necrosis: Yes Has patient had a PCN reaction that required hospitalization No Has patient had a PCN reaction occurring within the last 10 years: No. If all of the above answers are "NO", then may proceed with Cephalosporin use.     MEDICATIONS: Current Outpatient Medications on File Prior to Visit  Medication Sig Dispense Refill  . aspirin EC 81 MG EC tablet Take 1 tablet (81 mg total) by mouth daily.    Marland Kitchen atorvastatin (LIPITOR) 20 MG tablet Take 1 tablet (20 mg total) by mouth daily at 6 PM. 30 tablet 2  . budesonide-formoterol (SYMBICORT) 80-4.5 MCG/ACT inhaler Inhale 2 puffs into the lungs 2 (two) times daily as needed (wheezing or shortness of breath).    Marland Kitchen glimepiride (AMARYL) 4 MG tablet Take 0.5 tablets (2 mg total) by mouth daily with breakfast. (Patient taking differently: Take 4 mg by mouth daily with breakfast. ) 30 tablet 0  . glucose blood test strip Use as instructed 100 each 1  . HYDROcodone-acetaminophen (NORCO/VICODIN) 5-325 MG tablet Take 1 tablet by mouth every 4 (four) hours as needed. 10 tablet 0  . MELATONIN PO Take 1 tablet by mouth at bedtime as needed  (sleep).     . metFORMIN (GLUCOPHAGE) 1000 MG tablet Take 1,000 mg by mouth 2 (two) times daily with a meal.    . ondansetron (ZOFRAN ODT) 4 MG disintegrating tablet Take 1 tablet (4 mg total) by mouth every 8 (eight) hours as needed. 10 tablet 0   No current facility-administered medications on file prior to visit.     PAST MEDICAL HISTORY: Past Medical History:  Diagnosis Date  . Anemia 2006   required transfusion post TAH/BSO 05/2005  . Arthritis   . Asthma   . Chronic cough   . Colon polyps    hyperplastic  . Diabetes mellitus without complication ([place])   . Diverticulosis   . GERD (gastroesophageal reflux disease)    on prilosec  . GLA deficiency ([place])   . Glaucoma of both eyes   . Hypertension   . Joint pain   . Osteoarthritis   . Pancreatitis   . Shortness of breath dyspnea   . Vitamin D deficiency     PAST SURGICAL HISTORY: Past Surgical History:  Procedure Laterality Date  . ABDOMINAL HYSTERECTOMY    . BREAST EXCISIONAL BIOPSY Right 1990  . COLONOSCOPY N/A 08/22/2013   Procedure: COLONOSCOPY;  Surgeon: Irene Shipper, MD;  Location: [facility]  ENDOSCOPY;  Service: Endoscopy;  Laterality: N/A;  . ESOPHAGOGASTRODUODENOSCOPY N/A 08/22/2013   Procedure: ESOPHAGOGASTRODUODENOSCOPY (EGD);  Surgeon: Irene Shipper, MD;  Location[facility] ENDOSCOPY;  Service: Endoscopy;  Laterality: N/A;  . ESOPHAGOGASTRODUODENOSCOPY (EGD) WITH PROPOFOL N/A 08/19/2015   Procedure: ESOPHAGOGASTRODUODENOSCOPY (EGD) WITH PROPOFOL;  Surgeon: Irene Shipper, MD;  Location: [facility] ENDOSCOPY;  Service: Endoscopy;  Laterality: N/A;  . HERNIA REPAIR    . HYSTEROSCOPY W/D&C  01/2005   for uterine fibroids.   . INSERTION OF MESH N/A 08/24/2013   Procedure: INSERTION OF MESH;  Surgeon: Edward Jolly, MD;  Location: [place];  Service: General;  Laterality: N/A;  . JOINT REPLACEMENT Right   . LIPOMA EXCISION Left 06/21/2015   Procedure: EXCISION OF LEFT SCALP LIPOMA;  Surgeon: Johnathan Hausen, MD;  Location: [place];  Service: General;  Laterality: Left;  . PANNICULECTOMY N/A 08/24/2013   Procedure: PANNICULECTOMY;  Surgeon: Edward Jolly, MD;  Location: [place];  Service: General;  Laterality: N/A;  . TOTAL ABDOMINAL HYSTERECTOMY W/ BILATERAL SALPINGOOPHORECTOMY  05/2005  . TOTAL HIP ARTHROPLASTY Right 12/12/2013   Procedure: RIGHT TOTAL HIP ARTHROPLASTY ANTERIOR APPROACH;  Surgeon: Mcarthur Rossetti, MD;  Location: [place];  Service: Orthopedics;  Laterality: Right;  . VENTRAL HERNIA REPAIR N/A 08/24/2013   Procedure: HERNIA REPAIR VENTRAL ADULT;  Surgeon: Edward Jolly, MD;  Location: [place];  Service: General;  Laterality: N/A;    SOCIAL HISTORY: Social History   Tobacco Use  . Smoking status: Former Smoker    Packs/day: 0.25    Years: 15.00    Pack years: 3.75    Types: Cigarettes    Quit date: 06/22/1978    Years since quitting: 40.9  . Smokeless tobacco: Never Used  Substance Use Topics  . Alcohol use: No    Alcohol/week: 0.0 standard drinks  . Drug use: No    FAMILY HISTORY: Family History  Problem Relation Age of Onset  . Emphysema Mother        smoked  . Diabetes Mother   . Hypertension Mother   . Colon cancer Father        7-s  . Hypertension Father   . Heart disease Father   . High Cholesterol Father   . Breast cancer Sister    ROS: Review of Systems  Gastrointestinal: Negative for nausea and vomiting.  Musculoskeletal:       Negative for muscle weakness.   PHYSICAL EXAM: Blood pressure 123/82, pulse 68, temperature 98.5 F (36.9 C), height 5\' 2"  (1.575 m), weight 209 lb (94.8 kg), SpO2 100 %. Body mass index is 38.23 kg/m. Physical Exam Vitals signs reviewed.  Constitutional:      Appearance: Normal appearance. She is obese.  Cardiovascular:     Rate and Rhythm: Normal rate.     Pulses: Normal pulses.  Pulmonary:     Effort: Pulmonary effort is normal.     Breath sounds: Normal breath sounds.  Musculoskeletal: Normal range of motion.   Skin:    General: Skin is warm and dry.  Neurological:     Mental Status: She is alert and oriented to person, place, and time.  Psychiatric:        Behavior: Behavior normal.   RECENT LABS AND TESTS: BMET    Component Value Date/Time   NA 139 03/28/2019 1328   NA 141 08/02/2017 1035   K 4.8 03/28/2019 1328   CL 105 03/28/2019 1328   CO2 24 03/28/2019 1326  GLUCOSE 153 (H) 03/28/2019 1328   BUN 18 03/28/2019 1328   BUN 14 08/02/2017 1035   CREATININE 0.90 03/28/2019 1328   CALCIUM 9.5 03/28/2019 1326   GFRNONAA >60 03/28/2019 1326   GFRAA >60 03/28/2019 1326   Lab Results  Component Value Date   HGBA1C 7.7 (H) 03/29/2019   HGBA1C 6.8 (H) 08/02/2017   Lab Results  Component Value Date   INSULIN 16.2 04/24/2019   INSULIN 17.4 08/02/2017   CBC    Component Value Date/Time   WBC 7.4 03/28/2019 1326   RBC 4.68 03/28/2019 1326   HGB 14.6 03/28/2019 1328   HGB 13.4 08/02/2017 1035   HCT 43.0 03/28/2019 1328   HCT 40.7 08/02/2017 1035   PLT 263 03/28/2019 1326   MCV 90.2 03/28/2019 1326   MCV 86 08/02/2017 1035   MCH 30.6 03/28/2019 1326   MCHC 33.9 03/28/2019 1326   RDW 13.2 03/28/2019 1326   RDW 14.9 08/02/2017 1035   LYMPHSABS 3.5 03/28/2019 1326   LYMPHSABS 2.2 08/02/2017 1035   MONOABS 0.6 03/28/2019 1326   EOSABS 0.4 03/28/2019 1326   EOSABS 0.2 08/02/2017 1035   BASOSABS 0.1 03/28/2019 1326   BASOSABS 0.0 08/02/2017 1035   Iron/TIBC/Ferritin/ %Sat No results found for: IRON, TIBC, FERRITIN, IRONPCTSAT Lipid Panel     Component Value Date/Time   CHOL 129 03/29/2019 0324   CHOL 135 08/02/2017 1035   TRIG 50 03/29/2019 0324   HDL 46 03/29/2019 0324   HDL 54 08/02/2017 1035   CHOLHDL 2.8 03/29/2019 0324   VLDL 10 03/29/2019 0324   LDLCALC 73 03/29/2019 0324   LDLCALC 68 08/02/2017 1035   Hepatic Function Panel     Component Value Date/Time   PROT 7.1 03/28/2019 1326   PROT 6.9 08/02/2017 1035   ALBUMIN 3.9 03/28/2019 1326   ALBUMIN 4.4  08/02/2017 1035   AST 19 03/28/2019 1326   ALT 22 03/28/2019 1326   ALKPHOS 86 03/28/2019 1326   BILITOT 0.7 03/28/2019 1326   BILITOT 0.3 08/02/2017 1035      Component Value Date/Time   TSH 3.120 04/24/2019 0830   TSH 2.000 08/02/2017 1035   Results for DONDREA, CAVINESS (MRN [id number]) as of 05/10/2019 14:51  Ref. Range 04/24/2019 08:30  Vitamin D, 25-Hydroxy Latest Ref Range: 30.0 - 100.0 ng/mL 25.7 (L)   OBESITY BEHAVIORAL INTERVENTION VISIT  Today's visit was #10  Starting weight: 204 lbs Starting date: 08/02/2017 Today's weight: 209 lbs  Today's date: 05/08/2019 Total lbs lost to date: 0     05/08/2019  Height 5\' 2"  (1.575 m)  Weight 209 lb (94.8 kg)  BMI (Calculated) 38.22  BLOOD PRESSURE - SYSTOLIC [id number]  BLOOD PRESSURE - DIASTOLIC 82   Body Fat % [id number] %  Total Body Water (lbs) 75 lbs   ASK: We discussed the diagnosis of obesity with [name] today and [name] agreed to give [name] permission to discuss obesity behavioral modification therapy today.  ASSESS: [name] has the diagnosis of obesity and her BMI today is 38.3. [name] is in the action stage of change.   ADVISE: [name] was educated on the multiple health risks of obesity as well as the benefit of weight loss to improve her health. She was advised of the need for long term treatment and the importance of lifestyle modifications to improve her current health and to decrease her risk of future health problems.  AGREE: Multiple dietary modification options and treatment options were discussed and  [name]  agreed to follow the recommendations documented in the above note.  ARRANGE: [name] was educated on the importance of frequent visits to treat obesity as outlined per CMS and USPSTF guidelines and agreed to schedule her next follow up appointment today.  Migdalia Dk, am acting as Location manager for [employer], DO  I have reviewed the above documentation for accuracy and completeness, and I agree with  the above. -Jearld Lesch, DO

## 2019-05-10 NOTE — Telephone Encounter (Signed)
Please review

## 2019-05-16 ENCOUNTER — Encounter: Payer: Self-pay | Admitting: Adult Health

## 2019-05-16 ENCOUNTER — Other Ambulatory Visit: Payer: Self-pay

## 2019-05-16 ENCOUNTER — Ambulatory Visit: Payer: BC Managed Care – PPO | Admitting: Adult Health

## 2019-05-16 VITALS — BP 124/76 | HR 71 | Temp 97.6°F | Ht 62.0 in | Wt 217.0 lb

## 2019-05-16 DIAGNOSIS — I10 Essential (primary) hypertension: Secondary | ICD-10-CM | POA: Diagnosis not present

## 2019-05-16 DIAGNOSIS — E119 Type 2 diabetes mellitus without complications: Secondary | ICD-10-CM

## 2019-05-16 DIAGNOSIS — G459 Transient cerebral ischemic attack, unspecified: Secondary | ICD-10-CM | POA: Diagnosis not present

## 2019-05-16 DIAGNOSIS — E785 Hyperlipidemia, unspecified: Secondary | ICD-10-CM | POA: Diagnosis not present

## 2019-05-16 NOTE — Progress Notes (Signed)
[facility] [address] [place]. [place] [address] [phone]       HOSPITAL FOLLOW UP NOTE  [name] Date of Birth:  07-11-1955 Medical Record Number:  [id number]   Reason for Referral:  hospital stroke follow up    CHIEF COMPLAINT:  Chief Complaint  Patient presents with  . Follow-up    Room 9, alone. No changes.    HPI: [name] being seen today for in office hospital follow-up regarding TIA status post TPA on 03/28/2019.  History obtained from patient and chart review. Reviewed all radiology images and labs personally.  [name] a 63 y.o.femalewith history of HTN, DBpresented on 03/28/2019 with L sided numbness and heaviness.  CT head unremarkable andreceived tPA 03/28/2019 at [id number] without complication. MRI negative for acute abnormality.  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  2D echo normal EF without cardiac source of embolus identified.  Recommended aspirin 81 mg daily.  HTN stabilized with initial elevation in SBP.  Initiated atorvastatin 20 mg daily for LDL 73.  Uncontrolled DM with A1c 7.7 and resumed Amaryl and Metformin and recommend close PCP follow-up.  Other stroke risk factors include former tobacco use and morbid obesity but no prior history of stroke.  Other active problems include evidence of thyroid nodule on CTA with size meeting criteria for FNA and recommended biopsy outpatient.  Discharged home in stable condition without therapy needs.  [name] is a 63 year old female who is being seen today for hospital follow-up.  She has been stable from a stroke standpoint without recurring or new stroke/TIA symptoms.  She did undergo FNA biopsy thyroid on 04/26/2019 with biopsy results benign follicular nodule.  Continues on aspirin 81 mg daily without bleeding or bruising.  Continues on atorvastatin without  myalgias.  Blood pressure today 124/76.  Glucose levels have been stable.  She continues to follow with her PCP for HTN, HLD and DM management.  Currently being followed by healthy weight and wellness for assistance on weight loss goals.  No concerns at this time.    ROS:   14 system review of systems performed and negative with exception of no complaints  PMH:  Past Medical History:  Diagnosis Date  . Anemia 2006   required transfusion post TAH/BSO 05/2005  . Arthritis   . Asthma   . Chronic cough   . Colon polyps    hyperplastic  . Diabetes mellitus without complication ([place])   . Diverticulosis   . GERD (gastroesophageal reflux disease)    on prilosec  . GLA deficiency ([place])   . Glaucoma of both eyes   . Hypertension   . Joint pain   . Osteoarthritis   . Pancreatitis   . Shortness of breath dyspnea   . Vitamin D deficiency     PSH:  Past Surgical History:  Procedure Laterality Date  . ABDOMINAL HYSTERECTOMY    . BREAST EXCISIONAL BIOPSY Right 1990  . COLONOSCOPY N/A 08/22/2013   Procedure: COLONOSCOPY;  Surgeon: Irene Shipper, MD;  Location[facility] ENDOSCOPY;  Service: Endoscopy;  Laterality: N/A;  . ESOPHAGOGASTRODUODENOSCOPY N/A 08/22/2013   Procedure: ESOPHAGOGASTRODUODENOSCOPY (EGD);  Surgeon: Irene Shipper, MD;  Location: [facility] ENDOSCOPY;  Service: Endoscopy;  Laterality: N/A;  . ESOPHAGOGASTRODUODENOSCOPY (EGD) WITH PROPOFOL N/A 08/19/2015   Procedure: ESOPHAGOGASTRODUODENOSCOPY (EGD) WITH PROPOFOL;  Surgeon: Irene Shipper, MD;  Location: [facility]  ENDOSCOPY;  Service: Endoscopy;  Laterality: N/A;  . HERNIA REPAIR    . HYSTEROSCOPY W/D&C  01/2005   for uterine fibroids.   . INSERTION OF MESH N/A 08/24/2013   Procedure: INSERTION OF MESH;  Surgeon: Edward Jolly, MD;  Location: [place];  Service: General;  Laterality: N/A;  . JOINT REPLACEMENT Right   . LIPOMA EXCISION Left 06/21/2015   Procedure: EXCISION OF LEFT SCALP LIPOMA;  Surgeon: Johnathan Hausen, MD;  Location: [place];  Service: General;  Laterality: Left;  . PANNICULECTOMY N/A 08/24/2013   Procedure: PANNICULECTOMY;  Surgeon: Edward Jolly, MD;  Location: [place];  Service: General;  Laterality: N/A;  . TOTAL ABDOMINAL HYSTERECTOMY W/ BILATERAL SALPINGOOPHORECTOMY  05/2005  . TOTAL HIP ARTHROPLASTY Right 12/12/2013   Procedure: RIGHT TOTAL HIP ARTHROPLASTY ANTERIOR APPROACH;  Surgeon: Mcarthur Rossetti, MD;  Location: [place];  Service: Orthopedics;  Laterality: Right;  . VENTRAL HERNIA REPAIR N/A 08/24/2013   Procedure: HERNIA REPAIR VENTRAL ADULT;  Surgeon: Edward Jolly, MD;  Location: [facility] OR;  Service: General;  Laterality: N/A;    Social History:  Social History   Socioeconomic History  . Marital status: Married    Spouse name: Amara Denardis  . Number of children: 5  . Years of education: Not on file  . Highest education level: Not on file  Occupational History  . Occupation: Journalist, newspaper  . Financial resource strain: Not on file  . Food insecurity    Worry: Not on file    Inability: Not on file  . Transportation needs    Medical: Not on file    Non-medical: Not on file  Tobacco Use  . Smoking status: Former Smoker    Packs/day: 0.25    Years: 15.00    Pack years: 3.75    Types: Cigarettes    Quit date: 06/22/1978    Years since quitting: 40.9  . Smokeless tobacco: Never Used  Substance and Sexual Activity  . Alcohol use: No    Alcohol/week: 0.0 standard drinks  . Drug use: No  . Sexual activity: Not on file  Lifestyle  . Physical activity    Days per week: Not on file    Minutes per session: Not on file  . Stress: Not on file  Relationships  . Social Herbalist on phone: Not on file    Gets together: Not on file    Attends religious service: Not on file    Active member of club or organization: Not on file    Attends meetings of clubs or organizations: Not on file    Relationship status: Not on file  . Intimate partner  violence    Fear of current or ex partner: Not on file    Emotionally abused: Not on file    Physically abused: Not on file    Forced sexual activity: Not on file  Other Topics Concern  . Not on file  Social History Narrative  . Not on file    Family History:  Family History  Problem Relation Age of Onset  . Emphysema Mother        smoked  . Diabetes Mother   . Hypertension Mother   . Colon cancer Father        7-s  . Hypertension Father   . Heart disease Father   . High Cholesterol Father   . Breast cancer Sister     Medications:  Current Outpatient Medications on File Prior to Visit  Medication Sig Dispense Refill  . aspirin EC 81 MG EC tablet Take 1 tablet (81 mg total) by mouth daily.    Marland Kitchen atorvastatin (LIPITOR) 20 MG tablet Take 1 tablet (20 mg total) by mouth daily at 6 PM. 30 tablet 2  . budesonide-formoterol (SYMBICORT) 80-4.5 MCG/ACT inhaler Inhale 2 puffs into the lungs 2 (two) times daily as needed (wheezing or shortness of breath).    Marland Kitchen glimepiride (AMARYL) 4 MG tablet Take 0.5 tablets (2 mg total) by mouth daily with breakfast. (Patient taking differently: Take 4 mg by mouth daily with breakfast. ) 30 tablet 0  . glucose blood test strip Use as instructed 100 each 1  . MELATONIN PO Take 1 tablet by mouth at bedtime as needed (sleep).     . metFORMIN (GLUCOPHAGE) 1000 MG tablet Take 1,000 mg by mouth 2 (two) times daily with a meal.    . Vitamin D, Ergocalciferol, (DRISDOL) 1.25 MG (50000 UT) CAPS capsule Take 1 capsule (50,000 Units total) by mouth every 7 (seven) days. 4 capsule 0  . ondansetron (ZOFRAN ODT) 4 MG disintegrating tablet Take 1 tablet (4 mg total) by mouth every 8 (eight) hours as needed. (Patient not taking: Reported on 05/16/2019) 10 tablet 0   No current facility-administered medications on file prior to visit.     Allergies:   Allergies  Allergen Reactions  . Diclofenac     Hives   . Penicillins Hives    Has patient had a PCN reaction  causing immediate rash, facial/tongue/throat swelling, SOB or lightheadedness with hypotension: Yes Has patient had a PCN reaction causing severe rash involving mucus membranes or skin necrosis: Yes Has patient had a PCN reaction that required hospitalization No Has patient had a PCN reaction occurring within the last 10 years: No. If all of the above answers are "NO", then may proceed with Cephalosporin use.      Physical Exam  Vitals:   05/16/19 1511  BP: 124/76  Pulse: 71  Temp: 97.6 F (36.4 C)  Weight: 217 lb (98.4 kg)  Height: 5\' 2"  (1.575 m)   Body mass index is 39.69 kg/m. No exam data present   General: Pleasant obese middle-aged African-American female, seated, in no evident distress Head: head normocephalic and atraumatic.   Neck: supple with no carotid or supraclavicular bruits Cardiovascular: regular rate and rhythm, no murmurs Musculoskeletal: no deformity Skin:  no rash/petichiae Vascular:  Normal pulses all extremities   Neurologic Exam Mental Status: Awake and fully alert. Oriented to place and time. Recent and remote memory intact. Attention span, concentration and fund of knowledge appropriate. Mood and affect appropriate.  Cranial Nerves: Fundoscopic exam reveals sharp disc margins. Pupils equal, briskly reactive to light. Extraocular movements full without nystagmus. Visual fields full to confrontation. Hearing intact. Facial sensation intact. Face, tongue, palate moves normally and symmetrically.  Motor: Normal bulk and tone. Normal strength in all tested extremity muscles. Sensory.: intact to touch , pinprick , position and vibratory sensation.  Coordination: Rapid alternating movements normal in all extremities. Finger-to-nose and heel-to-shin performed accurately bilaterally. Gait and Station: Arises from chair without difficulty. Stance is normal. Gait demonstrates normal stride length and balance Reflexes: 1+ and symmetric. Toes downgoing.      NIHSS  0 Modified Rankin  0    Diagnostic Data (Labs, Imaging, Testing)   Code Stroke CT head No acute abnormality. ASPECTS 10.   CTA head & neckno ELVO. No sign  stenosis in neck. Small L VA w/ stenosis at skull base. thyroid nodule.  MRIno acute abnormality. Decreased T1 marrow signal.   2D EchoEF60-65%. No source of embolus  LDL73  HgbA1c7.7    ASSESSMENT: [name] is a 63 y.o. 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 Vascular risk factors include HTN, HLD, uncontrolled DM, prior history of tobacco use, and morbid obesity.  CTA also showed evidence of thyroid nodule during admission and recently had biopsy which was benign.  Has been stable from a stroke standpoint    PLAN:  1. Right brain TIA: Continue aspirin 81 mg daily  and atorvastatin for secondary stroke prevention. Maintain strict control of hypertension with blood pressure goal below 130/90, diabetes with hemoglobin A1c goal below 6.5% and cholesterol with LDL cholesterol (bad cholesterol) goal below 70 mg/dL.  I also advised the patient to eat a healthy diet with plenty of whole grains, cereals, fruits and vegetables, exercise regularly with at least 30 minutes of continuous activity daily and maintain ideal body weight. 2. HTN: Advised to continue current treatment regimen.  Today's BP stable.  Advised to continue to monitor at home along with continued follow-up with PCP for management 3. HLD: Advised to continue current treatment regimen along with continued follow-up with PCP for future prescribing and monitoring of lipid panel 4. DMII: Advised to continue to monitor glucose levels at home along with continued follow-up with PCP for management and monitoring     Follow up in 6 months or call earlier if needed   Greater than 50% of time during this 45  minute visit was spent on counseling, explanation of diagnosis of right brain TIA, reviewing risk factor management of HTN, HLD and DM, planning of further management along with potential future management, and discussion with patient and family answering all questions.    Frann Rider, AGNP-BC  [facility] [address] [place], [place] [address]  Phone [phone] Fax [phone] Note: This document was prepared with digital dictation and possible smart phrase technology. Any transcriptional errors that result from this process are unintentional.

## 2019-05-16 NOTE — Patient Instructions (Signed)
Continue aspirin 81 mg daily  and Lipitor for secondary stroke prevention  Continue to follow up with PCP regarding cholesterol, blood pressure and diabetes management   Continue to follow with healthy weight and wellness for assistance in achieving your weight loss goals  Continue to monitor blood pressure at home  Maintain strict control of hypertension with blood pressure goal below 130/90, diabetes with hemoglobin A1c goal below 6.5% and cholesterol with LDL cholesterol (bad cholesterol) goal below 70 mg/dL. I also advised the patient to eat a healthy diet with plenty of whole grains, cereals, fruits and vegetables, exercise regularly and maintain ideal body weight.  Followup in the future with me in 6 months or call earlier if needed       Thank you for coming to see [name] at [facility]. I hope we have been able to provide you high quality care today.  You may receive a patient satisfaction survey over the next few weeks. We would appreciate your feedback and comments so that we may continue to improve ourselves and the health of our patients.

## 2019-05-30 ENCOUNTER — Telehealth (INDEPENDENT_AMBULATORY_CARE_PROVIDER_SITE_OTHER): Payer: BC Managed Care – PPO | Admitting: Bariatrics

## 2019-05-30 ENCOUNTER — Encounter (INDEPENDENT_AMBULATORY_CARE_PROVIDER_SITE_OTHER): Payer: Self-pay | Admitting: Bariatrics

## 2019-05-30 ENCOUNTER — Other Ambulatory Visit: Payer: Self-pay

## 2019-05-30 DIAGNOSIS — E785 Hyperlipidemia, unspecified: Secondary | ICD-10-CM

## 2019-05-30 DIAGNOSIS — E119 Type 2 diabetes mellitus without complications: Secondary | ICD-10-CM

## 2019-05-30 DIAGNOSIS — Z6835 Body mass index (BMI) 35.0-35.9, adult: Secondary | ICD-10-CM | POA: Diagnosis not present

## 2019-05-30 NOTE — Progress Notes (Signed)
Office: [phone]  /  Fax: [phone] TeleHealth Visit:  [name] has verbally consented to this TeleHealth visit today. The patient is located in the car driving, the provider is located at the [employer] [facility] office. The participants in this visit include the listed provider and patient. The visit was conducted today via telephone call.  HPI:   Chief Complaint: OBESITY [name] is here to discuss her progress with her obesity treatment plan. She is on the Pescatarian eating plan alternating with Category 2 and is following her eating plan approximately 50% of the time. She states she is exercising 0 minutes 0 times per week. [name] states that her weight remains the same. She reports doing well with her water intake. We were unable to weigh the patient today for this TeleHealth visit. She feels as if she has maintained her weight since her last visit. She has lost 0 lbs since starting treatment with [name].  Diabetes II [name] has a diagnosis of diabetes type II and is taking Amaryl and metformin. [name] states fasting blood sugars range between 110 and 120's. She denies any hypoglycemic episodes. Last A1c was 7.7 on 03/29/2019 with an insulin of 16.2 on 04/24/2019. She has been working on intensive lifestyle modifications including diet, exercise, and weight loss to help control her blood glucose levels.  Hyperlipidemia [name] has hyperlipidemia and has been trying to improve her cholesterol levels with intensive lifestyle modification including a low saturated fat diet, exercise and weight loss. She is taking Lipitor and denies any myalgias.  ASSESSMENT AND PLAN:  Diabetes mellitus without complication (HCC)  Hyperlipidemia, unspecified hyperlipidemia type  Class 2 severe obesity with serious comorbidity and body mass index (BMI) of 35.0 to 35.9 in adult, unspecified obesity type ([place])  PLAN:  Diabetes II [name] has been given diabetes education by myself today. Good  blood sugar control is important to decrease the likelihood of diabetic complications such as nephropathy, neuropathy, limb loss, blindness, coronary artery disease, and death. Intensive lifestyle modification including diet, exercise and weight loss were discussed as the first line treatment for diabetes. She will continue her medications and follow-up as directed.  Hyperlipidemia Intensive lifestyle modifications as the first line treatment for hyperlipidemia. We discussed many lifestyle modifications today and [name] will continue to work on diet, exercise and weight loss efforts. She will continue her medications and decrease saturated and trans fats.  Obesity [name] is currently in the action stage of change. As such, her goal is to continue with weight loss efforts. She has agreed to follow the Category 2 plan alternating with the Pescatarian plan. [name] will work on meal planning, will not skip meals, and will increase her protein. [name] has been instructed to walk several days a week for weight loss and overall health benefits. We discussed the following Behavioral Modification Strategies today: increasing lean protein intake, decreasing simple carbohydrates, increasing vegetables, increase H20 intake, decrease eating out, no skipping meals, work on meal planning and easy cooking plans, keeping healthy foods in the home, and planning for success.  [name] has agreed to follow-up with our clinic in 2-4 weeks. She was informed of the importance of frequent follow-up visits to maximize her success with intensive lifestyle modifications for her multiple health conditions.  ALLERGIES: Allergies  Allergen Reactions  . Diclofenac     Hives   . Penicillins Hives    Has patient had a PCN reaction causing immediate rash, facial/tongue/throat swelling, SOB or lightheadedness with hypotension: Yes Has patient  had a PCN reaction causing severe rash involving mucus membranes or skin necrosis: Yes Has  patient had a PCN reaction that required hospitalization No Has patient had a PCN reaction occurring within the last 10 years: No. If all of the above answers are "NO", then may proceed with Cephalosporin use.     MEDICATIONS: Current Outpatient Medications on File Prior to Visit  Medication Sig Dispense Refill  . aspirin EC 81 MG EC tablet Take 1 tablet (81 mg total) by mouth daily.    Marland Kitchen atorvastatin (LIPITOR) 20 MG tablet Take 1 tablet (20 mg total) by mouth daily at 6 PM. 30 tablet 2  . budesonide-formoterol (SYMBICORT) 80-4.5 MCG/ACT inhaler Inhale 2 puffs into the lungs 2 (two) times daily as needed (wheezing or shortness of breath).    Marland Kitchen glimepiride (AMARYL) 4 MG tablet Take 0.5 tablets (2 mg total) by mouth daily with breakfast. (Patient taking differently: Take 4 mg by mouth daily with breakfast. ) 30 tablet 0  . glucose blood test strip Use as instructed 100 each 1  . MELATONIN PO Take 1 tablet by mouth at bedtime as needed (sleep).     . metFORMIN (GLUCOPHAGE) 1000 MG tablet Take 1,000 mg by mouth 2 (two) times daily with a meal.    . ondansetron (ZOFRAN ODT) 4 MG disintegrating tablet Take 1 tablet (4 mg total) by mouth every 8 (eight) hours as needed. (Patient not taking: Reported on 05/16/2019) 10 tablet 0  . Vitamin D, Ergocalciferol, (DRISDOL) 1.25 MG (50000 UT) CAPS capsule Take 1 capsule (50,000 Units total) by mouth every 7 (seven) days. 4 capsule 0   No current facility-administered medications on file prior to visit.     PAST MEDICAL HISTORY: Past Medical History:  Diagnosis Date  . Anemia 2006   required transfusion post TAH/BSO 05/2005  . Arthritis   . Asthma   . Chronic cough   . Colon polyps    hyperplastic  . Diabetes mellitus without complication ([place])   . Diverticulosis   . GERD (gastroesophageal reflux disease)    on prilosec  . GLA deficiency ([place])   . Glaucoma of both eyes   . Hypertension   . Joint pain   . Osteoarthritis   . Pancreatitis   .  Shortness of breath dyspnea   . Vitamin D deficiency     PAST SURGICAL HISTORY: Past Surgical History:  Procedure Laterality Date  . ABDOMINAL HYSTERECTOMY    . BREAST EXCISIONAL BIOPSY Right 1990  . COLONOSCOPY N/A 08/22/2013   Procedure: COLONOSCOPY;  Surgeon: Irene Shipper, MD;  Location: [facility] ENDOSCOPY;  Service: Endoscopy;  Laterality: N/A;  . ESOPHAGOGASTRODUODENOSCOPY N/A 08/22/2013   Procedure: ESOPHAGOGASTRODUODENOSCOPY (EGD);  Surgeon: Irene Shipper, MD;  Location: [facility] ENDOSCOPY;  Service: Endoscopy;  Laterality: N/A;  . ESOPHAGOGASTRODUODENOSCOPY (EGD) WITH PROPOFOL N/A 08/19/2015   Procedure: ESOPHAGOGASTRODUODENOSCOPY (EGD) WITH PROPOFOL;  Surgeon: Irene Shipper, MD;  Location: [facility] ENDOSCOPY;  Service: Endoscopy;  Laterality: N/A;  . HERNIA REPAIR    . HYSTEROSCOPY W/D&C  01/2005   for uterine fibroids.   . INSERTION OF MESH N/A 08/24/2013   Procedure: INSERTION OF MESH;  Surgeon: Edward Jolly, MD;  Location: [place];  Service: General;  Laterality: N/A;  . JOINT REPLACEMENT Right   . LIPOMA EXCISION Left 06/21/2015   Procedure: EXCISION OF LEFT SCALP LIPOMA;  Surgeon: Johnathan Hausen, MD;  Location: [place];  Service: General;  Laterality: Left;  . PANNICULECTOMY N/A 08/24/2013  Procedure: PANNICULECTOMY;  Surgeon: Edward Jolly, MD;  Location: [place];  Service: General;  Laterality: N/A;  . TOTAL ABDOMINAL HYSTERECTOMY W/ BILATERAL SALPINGOOPHORECTOMY  05/2005  . TOTAL HIP ARTHROPLASTY Right 12/12/2013   Procedure: RIGHT TOTAL HIP ARTHROPLASTY ANTERIOR APPROACH;  Surgeon: Mcarthur Rossetti, MD;  Location: [place];  Service: Orthopedics;  Laterality: Right;  . VENTRAL HERNIA REPAIR N/A 08/24/2013   Procedure: HERNIA REPAIR VENTRAL ADULT;  Surgeon: Edward Jolly, MD;  Location: [place];  Service: General;  Laterality: N/A;    SOCIAL HISTORY: Social History   Tobacco Use  . Smoking status: Former Smoker    Packs/day: 0.25    Years: 15.00    Pack  years: 3.75    Types: Cigarettes    Quit date: 06/22/1978    Years since quitting: 40.9  . Smokeless tobacco: Never Used  Substance Use Topics  . Alcohol use: No    Alcohol/week: 0.0 standard drinks  . Drug use: No    FAMILY HISTORY: Family History  Problem Relation Age of Onset  . Emphysema Mother        smoked  . Diabetes Mother   . Hypertension Mother   . Colon cancer Father        7-s  . Hypertension Father   . Heart disease Father   . High Cholesterol Father   . Breast cancer Sister    ROS: Review of Systems  Musculoskeletal: Negative for myalgias.  Endo/Heme/Allergies:       Negative for hypoglycemia.   PHYSICAL EXAM: Pt in no acute distress  RECENT LABS AND TESTS: BMET    Component Value Date/Time   NA 139 03/28/2019 1328   NA 141 08/02/2017 1035   K 4.8 03/28/2019 1328   CL 105 03/28/2019 1328   CO2 24 03/28/2019 1326   GLUCOSE 153 (H) 03/28/2019 1328   BUN 18 03/28/2019 1328   BUN 14 08/02/2017 1035   CREATININE 0.90 03/28/2019 1328   CALCIUM 9.5 03/28/2019 1326   GFRNONAA >60 03/28/2019 1326   GFRAA >60 03/28/2019 1326   Lab Results  Component Value Date   HGBA1C 7.7 (H) 03/29/2019   HGBA1C 6.8 (H) 08/02/2017   Lab Results  Component Value Date   INSULIN 16.2 04/24/2019   INSULIN 17.4 08/02/2017   CBC    Component Value Date/Time   WBC 7.4 03/28/2019 1326   RBC 4.68 03/28/2019 1326   HGB 14.6 03/28/2019 1328   HGB 13.4 08/02/2017 1035   HCT 43.0 03/28/2019 1328   HCT 40.7 08/02/2017 1035   PLT 263 03/28/2019 1326   MCV 90.2 03/28/2019 1326   MCV 86 08/02/2017 1035   MCH 30.6 03/28/2019 1326   MCHC 33.9 03/28/2019 1326   RDW 13.2 03/28/2019 1326   RDW 14.9 08/02/2017 1035   LYMPHSABS 3.5 03/28/2019 1326   LYMPHSABS 2.2 08/02/2017 1035   MONOABS 0.6 03/28/2019 1326   EOSABS 0.4 03/28/2019 1326   EOSABS 0.2 08/02/2017 1035   BASOSABS 0.1 03/28/2019 1326   BASOSABS 0.0 08/02/2017 1035   Iron/TIBC/Ferritin/ %Sat No results found  for: IRON, TIBC, FERRITIN, IRONPCTSAT Lipid Panel     Component Value Date/Time   CHOL 129 03/29/2019 0324   CHOL 135 08/02/2017 1035   TRIG 50 03/29/2019 0324   HDL 46 03/29/2019 0324   HDL 54 08/02/2017 1035   CHOLHDL 2.8 03/29/2019 0324   VLDL 10 03/29/2019 0324   LDLCALC 73 03/29/2019 0324   LDLCALC 68 08/02/2017 1035  Hepatic Function Panel     Component Value Date/Time   PROT 7.1 03/28/2019 1326   PROT 6.9 08/02/2017 1035   ALBUMIN 3.9 03/28/2019 1326   ALBUMIN 4.4 08/02/2017 1035   AST 19 03/28/2019 1326   ALT 22 03/28/2019 1326   ALKPHOS 86 03/28/2019 1326   BILITOT 0.7 03/28/2019 1326   BILITOT 0.3 08/02/2017 1035      Component Value Date/Time   TSH 3.120 04/24/2019 0830   TSH 2.000 08/02/2017 1035   Results for DAMINI, COVAULT (MRN [id number]) as of 05/30/2019 16:39  Ref. Range 04/24/2019 08:30  Vitamin D, 25-Hydroxy Latest Ref Range: 30.0 - 100.0 ng/mL 25.7 (L)   I, Michaelene Song, am acting as Location manager for [employer], DO  I have reviewed the above documentation for accuracy and completeness, and I agree with the above. -Jearld Lesch, DO

## 2019-06-14 ENCOUNTER — Other Ambulatory Visit (INDEPENDENT_AMBULATORY_CARE_PROVIDER_SITE_OTHER): Payer: Self-pay | Admitting: Bariatrics

## 2019-06-14 DIAGNOSIS — E559 Vitamin D deficiency, unspecified: Secondary | ICD-10-CM

## 2019-06-26 MED ORDER — VITAMIN D (ERGOCALCIFEROL) 1.25 MG (50000 UNIT) PO CAPS: 50000.0000 [IU] | ORAL_CAPSULE | ORAL | 0 refills | Status: DC

## 2019-06-29 ENCOUNTER — Ambulatory Visit (HOSPITAL_COMMUNITY)
Admission: EM | Admit: 2019-06-29 | Discharge: 2019-06-29 | Disposition: A | Discharge status: Home / Self Care | Payer: BC Managed Care – PPO

## 2019-06-29 ENCOUNTER — Other Ambulatory Visit: Payer: Self-pay

## 2019-06-29 ENCOUNTER — Encounter (HOSPITAL_COMMUNITY): Payer: Self-pay | Admitting: Emergency Medicine

## 2019-06-29 DIAGNOSIS — R059 Cough, unspecified: Secondary | ICD-10-CM

## 2019-06-29 DIAGNOSIS — R05 Cough: Secondary | ICD-10-CM

## 2019-06-29 DIAGNOSIS — J4540 Moderate persistent asthma, uncomplicated: Secondary | ICD-10-CM

## 2019-06-29 DIAGNOSIS — R062 Wheezing: Secondary | ICD-10-CM

## 2019-06-29 MED ORDER — BENZONATATE 100 MG PO CAPS: 100.0000 mg | ORAL_CAPSULE | Freq: Three times a day (TID) | ORAL | 0 refills | Status: DC | PRN

## 2019-06-29 MED ORDER — METHYLPREDNISOLONE SODIUM SUCC 125 MG IJ SOLR
125.0000 mg | Freq: Once | INTRAMUSCULAR | Status: AC
  Administered 2019-06-29: 125 mg via INTRAMUSCULAR

## 2019-06-29 MED ORDER — PROMETHAZINE-DM 6.25-15 MG/5ML PO SYRP: 5.0000 mL | ORAL_SOLUTION | Freq: Every evening | ORAL | 0 refills | Status: DC | PRN

## 2019-06-29 MED ORDER — ALBUTEROL SULFATE (2.5 MG/3ML) 0.083% IN NEBU: 2.5000 mg | INHALATION_SOLUTION | Freq: Four times a day (QID) | RESPIRATORY_TRACT | 0 refills | Status: DC | PRN

## 2019-06-29 MED ORDER — METHYLPREDNISOLONE SODIUM SUCC 125 MG IJ SOLR
INTRAMUSCULAR | Status: AC
  Filled 2019-06-29: qty 2

## 2019-06-29 NOTE — Discharge Instructions (Addendum)
For sore throat or cough try using a honey-based tea. Use 3 teaspoons of honey with juice squeezed from half lemon. Place shaved pieces of ginger into 1/2-1 cup of water and warm over stove top. Then mix the ingredients and repeat every 4 hours as needed.

## 2019-06-29 NOTE — ED Provider Notes (Signed)
[place]   MRN: [id number] DOB: [birth year]  Subjective:   [name] is a 64 y.o. female presenting for 4 week hx of persistent dry hacking cough, wheezing. Patient contacted her PCP and has had multiple televisits.  She has a history of moderate persistent asthma, takes Symbicort every day, uses albuterol nebulized form at night.  States that she was given a prednisone course but did not end up taking it.  She did get tested for COVID-19 on 06/20/2019 and was negative.  No current facility-administered medications for this encounter.  Current Outpatient Medications:  .  aspirin EC 81 MG EC tablet, Take 1 tablet (81 mg total) by mouth daily., Disp:  , Rfl:  .  atorvastatin (LIPITOR) 20 MG tablet, Take 1 tablet (20 mg total) by mouth daily at 6 PM., Disp: 30 tablet, Rfl: 2 .  budesonide-formoterol (SYMBICORT) 80-4.5 MCG/ACT inhaler, Inhale 2 puffs into the lungs 2 (two) times daily as needed (wheezing or shortness of breath)., Disp: , Rfl:  .  glimepiride (AMARYL) 4 MG tablet, Take 0.5 tablets (2 mg total) by mouth daily with breakfast. (Patient taking differently: Take 4 mg by mouth daily with breakfast. ), Disp: 30 tablet, Rfl: 0 .  glucose blood test strip, Use as instructed, Disp: 100 each, Rfl: 1 .  HYDROcodone-homatropine (HYCODAN) 5-1.5 MG/5ML syrup, Take 5 mLs by mouth every 6 (six) hours as needed., Disp: , Rfl:  .  MELATONIN PO, Take 1 tablet by mouth at bedtime as needed (sleep). , Disp: , Rfl:  .  metFORMIN (GLUCOPHAGE) 1000 MG tablet, Take 1,000 mg by mouth 2 (two) times daily with a meal., Disp: , Rfl:  .  Vitamin D, Ergocalciferol, (DRISDOL) 1.25 MG (50000 UT) CAPS capsule, Take 1 capsule (50,000 Units total) by mouth every 7 (seven) days., Disp: 4 capsule, Rfl: 0 .  ondansetron (ZOFRAN ODT) 4 MG disintegrating tablet, Take 1 tablet (4 mg total) by mouth every 8 (eight) hours as needed. (Patient not taking: Reported on 05/16/2019), Disp: 10 tablet, Rfl: 0 .   predniSONE (STERAPRED UNI-PAK 21 TAB) 10 MG (21) TBPK tablet, TAKE AS DIRECTED FOR 6 DAYS, Disp: , Rfl:    Allergies  Allergen Reactions  . Diclofenac     Hives   . Penicillins Hives    Has patient had a PCN reaction causing immediate rash, facial/tongue/throat swelling, SOB or lightheadedness with hypotension: Yes Has patient had a PCN reaction causing severe rash involving mucus membranes or skin necrosis: Yes Has patient had a PCN reaction that required hospitalization No Has patient had a PCN reaction occurring within the last 10 years: No. If all of the above answers are "NO", then may proceed with Cephalosporin use.     Past Medical History:  Diagnosis Date  . Anemia 2006   required transfusion post TAH/BSO 05/2005  . Arthritis   . Asthma   . Chronic cough   . Colon polyps    hyperplastic  . Diabetes mellitus without complication ([place])   . Diverticulosis   . GERD (gastroesophageal reflux disease)    on prilosec  . GLA deficiency ([place])   . Glaucoma of both eyes   . Hypertension   . Joint pain   . Osteoarthritis   . Pancreatitis   . Shortness of breath dyspnea   . Vitamin D deficiency      Past Surgical History:  Procedure Laterality Date  . ABDOMINAL HYSTERECTOMY    . BREAST EXCISIONAL BIOPSY Right 1990  .  COLONOSCOPY N/A 08/22/2013   Procedure: COLONOSCOPY;  Surgeon: Irene Shipper, MD;  Location: [facility] ENDOSCOPY;  Service: Endoscopy;  Laterality: N/A;  . ESOPHAGOGASTRODUODENOSCOPY N/A 08/22/2013   Procedure: ESOPHAGOGASTRODUODENOSCOPY (EGD);  Surgeon: Irene Shipper, MD;  Location: [facility] ENDOSCOPY;  Service: Endoscopy;  Laterality: N/A;  . ESOPHAGOGASTRODUODENOSCOPY (EGD) WITH PROPOFOL N/A 08/19/2015   Procedure: ESOPHAGOGASTRODUODENOSCOPY (EGD) WITH PROPOFOL;  Surgeon: Irene Shipper, MD;  Location: [facility] ENDOSCOPY;  Service: Endoscopy;  Laterality: N/A;  . HERNIA REPAIR    . HYSTEROSCOPY WITH D & C  01/2005   for uterine fibroids.   . INSERTION OF MESH N/A 08/24/2013    Procedure: INSERTION OF MESH;  Surgeon: Edward Jolly, MD;  Location: [place];  Service: General;  Laterality: N/A;  . JOINT REPLACEMENT Right   . LIPOMA EXCISION Left 06/21/2015   Procedure: EXCISION OF LEFT SCALP LIPOMA;  Surgeon: Johnathan Hausen, MD;  Location: [place];  Service: General;  Laterality: Left;  . PANNICULECTOMY N/A 08/24/2013   Procedure: PANNICULECTOMY;  Surgeon: Edward Jolly, MD;  Location: [place];  Service: General;  Laterality: N/A;  . TOTAL ABDOMINAL HYSTERECTOMY W/ BILATERAL SALPINGOOPHORECTOMY  05/2005  . TOTAL HIP ARTHROPLASTY Right 12/12/2013   Procedure: RIGHT TOTAL HIP ARTHROPLASTY ANTERIOR APPROACH;  Surgeon: Mcarthur Rossetti, MD;  Location: [place];  Service: Orthopedics;  Laterality: Right;  . VENTRAL HERNIA REPAIR N/A 08/24/2013   Procedure: HERNIA REPAIR VENTRAL ADULT;  Surgeon: Edward Jolly, MD;  Location: [facility] OR;  Service: General;  Laterality: N/A;    Family History  Problem Relation Age of Onset  . Emphysema Mother        smoked  . Diabetes Mother   . Hypertension Mother   . Colon cancer Father        7-s  . Hypertension Father   . Heart disease Father   . High Cholesterol Father   . Breast cancer Sister     Social History   Tobacco Use  . Smoking status: Former Smoker    Packs/day: 0.25    Years: 15.00    Pack years: 3.75    Types: Cigarettes    Quit date: 06/22/1978    Years since quitting: 41.0  . Smokeless tobacco: Never Used  Substance Use Topics  . Alcohol use: No    Alcohol/week: 0.0 standard drinks  . Drug use: No    Review of Systems  Constitutional: Positive for malaise/fatigue. Negative for fever.  HENT: Negative for congestion, ear pain, sinus pain and sore throat.   Eyes: Negative for discharge and redness.  Respiratory: Positive for cough and wheezing. Negative for hemoptysis and shortness of breath.   Cardiovascular: Negative for chest pain.  Gastrointestinal: Negative for abdominal  pain, diarrhea, nausea and vomiting.  Genitourinary: Negative for dysuria, flank pain and hematuria.  Musculoskeletal: Negative for myalgias.  Skin: Negative for rash.  Neurological: Negative for dizziness, weakness and headaches.  Psychiatric/Behavioral: Negative for depression and substance abuse.     Objective:   Vitals: BP (!) 166/94 (BP Location: Right Arm)   Pulse 68   Temp 98.6 F (37 C) (Oral)   Resp 18   SpO2 99%   Physical Exam Constitutional:      General: She is not in acute distress.    Appearance: Normal appearance. She is well-developed. She is not ill-appearing, toxic-appearing or diaphoretic.  HENT:     Head: Normocephalic and atraumatic.     Nose: Nose normal.  Mouth/Throat:     Mouth: Mucous membranes are moist.  Eyes:     Extraocular Movements: Extraocular movements intact.     Pupils: Pupils are equal, round, and reactive to light.  Cardiovascular:     Rate and Rhythm: Normal rate and regular rhythm.     Pulses: Normal pulses.     Heart sounds: Normal heart sounds. No murmur. No friction rub. No gallop.   Pulmonary:     Effort: Pulmonary effort is normal. No respiratory distress.     Breath sounds: No stridor. Wheezing (Diffuse throughout) present. No rhonchi or rales.  Skin:    General: Skin is warm and dry.     Findings: No rash.  Neurological:     Mental Status: She is alert and oriented to person, place, and time.  Psychiatric:        Mood and Affect: Mood normal.        Behavior: Behavior normal.        Thought Content: Thought content normal.        Judgment: Judgment normal.      Assessment and Plan :   1. Moderate persistent asthma without complication   2. Cough   3. Wheezing     IM Solu-Medrol in clinic, schedule nebulized albuterol and increase to 3 times a day.  Maintain Symbicort.  We will have patient use cough suppression medications as she could not tolerate or really use hydrocodone cough syrup.  Recommend she monitor  her BP and recheck with her PCP. Consider cxr if no improvement in ~2 days. Counseled patient on potential for adverse effects with medications prescribed/recommended today, ER and return-to-clinic precautions discussed, patient verbalized understanding.    Jaynee Eagles, [place] 06/29/19 1832

## 2019-06-29 NOTE — ED Triage Notes (Addendum)
Pt has had a cough x4 weeks.  She called her PCP and she prescribed her a night time cough medicine.  Pt has had to use her nebulizer nightly for over 10 days and her Symbicort daily.  She states all of these do help her symptoms but her PCP wants her to get a chest Xray at this point since the cough is not gone.  Pt was given a Rx for prednisone but has not taken it yet.  She was also tested for Covid on Dec. 29 and it was negative.

## 2019-07-04 ENCOUNTER — Ambulatory Visit (INDEPENDENT_AMBULATORY_CARE_PROVIDER_SITE_OTHER): Payer: BC Managed Care – PPO | Admitting: Bariatrics

## 2019-07-07 ENCOUNTER — Other Ambulatory Visit: Payer: Self-pay

## 2019-07-07 NOTE — Patient Outreach (Signed)
Telephone outreach to patient to obtain mRS was successfully completed. MRS=0   [facility]

## 2019-08-18 ENCOUNTER — Other Ambulatory Visit (INDEPENDENT_AMBULATORY_CARE_PROVIDER_SITE_OTHER): Payer: Self-pay | Admitting: Bariatrics

## 2019-08-18 DIAGNOSIS — E559 Vitamin D deficiency, unspecified: Secondary | ICD-10-CM

## 2019-08-19 ENCOUNTER — Ambulatory Visit: Payer: BC Managed Care – PPO | Attending: Internal Medicine

## 2019-08-19 DIAGNOSIS — Z23 Encounter for immunization: Secondary | ICD-10-CM | POA: Insufficient documentation

## 2019-08-19 NOTE — Progress Notes (Signed)
   Covid-19 Vaccination Clinic  Name:  [name]    MRN: [id number] DOB: December 13, 1955  08/19/2019  [name] was observed post Covid-19 immunization for 15 minutes without incidence. She was provided with Vaccine Information Sheet and instruction to access the V-Safe system.   [name] was instructed to call 911 with any severe reactions post vaccine: Marland Kitchen Difficulty breathing  . Swelling of your face and throat  . A fast heartbeat  . A bad rash all over your body  . Dizziness and weakness    Immunizations Administered    Name Date Dose VIS Date Route   Pfizer COVID-19 Vaccine 08/19/2019 12:01 PM 0.3 mL 06/02/2019 Intramuscular   Manufacturer: [place]   Lot: [id number]   [place]: [id number]

## 2019-08-19 NOTE — Progress Notes (Signed)
   Covid-19 Vaccination Clinic  Name:  SUNNYE SAARINEN    MRN: [id number] DOB: 02-04-1956  08/19/2019  Ms. Frieze was observed post Covid-19 immunization for 30 minutes based on pre-vaccination screening without incidence. She was provided with Vaccine Information Sheet and instruction to access the V-Safe system.   Ms. Westberry was instructed to call 911 with any severe reactions post vaccine: Marland Kitchen Difficulty breathing  . Swelling of your face and throat  . A fast heartbeat  . A bad rash all over your body  . Dizziness and weakness    Immunizations Administered    Name Date Dose VIS Date Route   Pfizer COVID-19 Vaccine 08/19/2019 12:01 PM 0.3 mL 06/02/2019 Intramuscular   Manufacturer: [place]   Lot: [id number]   [place]: [id number]

## 2019-09-09 ENCOUNTER — Ambulatory Visit: Payer: BC Managed Care – PPO | Attending: Internal Medicine

## 2019-09-09 DIAGNOSIS — Z23 Encounter for immunization: Secondary | ICD-10-CM

## 2019-09-09 NOTE — Progress Notes (Signed)
   Covid-19 Vaccination Clinic  Name:  [name]    MRN: [id number] DOB: Apr 11, 1956  09/09/2019  [name] was observed post Covid-19 immunization for 15 minutes without incident. She was provided with Vaccine Information Sheet and instruction to access the V-Safe system.   [name] was instructed to call 911 with any severe reactions post vaccine: Marland Kitchen Difficulty breathing  . Swelling of face and throat  . A fast heartbeat  . A bad rash all over body  . Dizziness and weakness   Immunizations Administered    Name Date Dose VIS Date Route   Pfizer COVID-19 Vaccine 09/09/2019 12:39 PM 0.3 mL 06/02/2019 Intramuscular   Manufacturer: [place]   Lot: [id number]   [place]: [id number]

## 2019-09-13 ENCOUNTER — Ambulatory Visit: Payer: BC Managed Care – PPO

## 2019-09-27 ENCOUNTER — Other Ambulatory Visit: Payer: Self-pay | Admitting: Family Medicine

## 2019-09-27 DIAGNOSIS — Z1231 Encounter for screening mammogram for malignant neoplasm of breast: Secondary | ICD-10-CM

## 2019-10-13 ENCOUNTER — Other Ambulatory Visit: Payer: Self-pay

## 2019-10-13 ENCOUNTER — Encounter: Payer: Self-pay | Admitting: Podiatry

## 2019-10-13 ENCOUNTER — Ambulatory Visit: Payer: BC Managed Care – PPO | Admitting: Podiatry

## 2019-10-13 VITALS — BP 148/97 | HR 60 | Temp 98.0°F

## 2019-10-13 DIAGNOSIS — M79675 Pain in left toe(s): Secondary | ICD-10-CM

## 2019-10-13 DIAGNOSIS — E119 Type 2 diabetes mellitus without complications: Secondary | ICD-10-CM | POA: Diagnosis not present

## 2019-10-13 DIAGNOSIS — M79674 Pain in right toe(s): Secondary | ICD-10-CM

## 2019-10-13 DIAGNOSIS — M2141 Flat foot [pes planus] (acquired), right foot: Secondary | ICD-10-CM | POA: Diagnosis not present

## 2019-10-13 DIAGNOSIS — I10 Essential (primary) hypertension: Secondary | ICD-10-CM | POA: Insufficient documentation

## 2019-10-13 DIAGNOSIS — M2142 Flat foot [pes planus] (acquired), left foot: Secondary | ICD-10-CM

## 2019-10-13 DIAGNOSIS — B351 Tinea unguium: Secondary | ICD-10-CM

## 2019-10-13 DIAGNOSIS — D649 Anemia, unspecified: Secondary | ICD-10-CM | POA: Insufficient documentation

## 2019-10-13 DIAGNOSIS — H409 Unspecified glaucoma: Secondary | ICD-10-CM | POA: Insufficient documentation

## 2019-10-13 DIAGNOSIS — L84 Corns and callosities: Secondary | ICD-10-CM | POA: Diagnosis not present

## 2019-10-13 DIAGNOSIS — D259 Leiomyoma of uterus, unspecified: Secondary | ICD-10-CM | POA: Insufficient documentation

## 2019-10-13 NOTE — Patient Instructions (Addendum)
New Balance Sneakers, 600 series or higher  Tea tree oil for toenails. Apply 1-2 drops to each toenail daily.  Our office will contact you regarding orthotics benefits.   Diabetes Mellitus and Foot Care Foot care is an important part of your health, especially when you have diabetes. Diabetes may cause you to have problems because of poor blood flow (circulation) to your feet and legs, which can cause your skin to:  Become thinner and drier.  Break more easily.  Heal more slowly.  Peel and crack. You may also have nerve damage (neuropathy) in your legs and feet, causing decreased feeling in them. This means that you may not notice minor injuries to your feet that could lead to more serious problems. Noticing and addressing any potential problems early is the best way to prevent future foot problems. How to care for your feet Foot hygiene  Wash your feet daily with warm water and mild soap. Do not use hot water. Then, pat your feet and the areas between your toes until they are completely dry. Do not soak your feet as this can dry your skin.  Trim your toenails straight across. Do not dig under them or around the cuticle. File the edges of your nails with an emery board or nail file.  Apply a moisturizing lotion or petroleum jelly to the skin on your feet and to dry, brittle toenails. Use lotion that does not contain alcohol and is unscented. Do not apply lotion between your toes. Shoes and socks  Wear clean socks or stockings every day. Make sure they are not too tight. Do not wear knee-high stockings since they may decrease blood flow to your legs.  Wear shoes that fit properly and have enough cushioning. Always look in your shoes before you put them on to be sure there are no objects inside.  To break in new shoes, wear them for just a few hours a day. This prevents injuries on your feet. Wounds, scrapes, corns, and calluses  Check your feet daily for blisters, cuts, bruises,  sores, and redness. If you cannot see the bottom of your feet, use a mirror or ask someone for help.  Do not cut corns or calluses or try to remove them with medicine.  If you find a minor scrape, cut, or break in the skin on your feet, keep it and the skin around it clean and dry. You may clean these areas with mild soap and water. Do not clean the area with peroxide, alcohol, or iodine.  If you have a wound, scrape, corn, or callus on your foot, look at it several times a day to make sure it is healing and not infected. Check for: ? Redness, swelling, or pain. ? Fluid or blood. ? Warmth. ? Pus or a bad smell. General instructions  Do not cross your legs. This may decrease blood flow to your feet.  Do not use heating pads or hot water bottles on your feet. They may burn your skin. If you have lost feeling in your feet or legs, you may not know this is happening until it is too late.  Protect your feet from hot and cold by wearing shoes, such as at the beach or on hot pavement.  Schedule a complete foot exam at least once a year (annually) or more often if you have foot problems. If you have foot problems, report any cuts, sores, or bruises to your health care provider immediately. Contact a health care provider if:  You have a medical condition that increases your risk of infection and you have any cuts, sores, or bruises on your feet.  You have an injury that is not healing.  You have redness on your legs or feet.  You feel burning or tingling in your legs or feet.  You have pain or cramps in your legs and feet.  Your legs or feet are numb.  Your feet always feel cold.  You have pain around a toenail. Get help right away if:  You have a wound, scrape, corn, or callus on your foot and: ? You have pain, swelling, or redness that gets worse. ? You have fluid or blood coming from the wound, scrape, corn, or callus. ? Your wound, scrape, corn, or callus feels warm to the  touch. ? You have pus or a bad smell coming from the wound, scrape, corn, or callus. ? You have a fever. ? You have a red line going up your leg. Summary  Check your feet every day for cuts, sores, red spots, swelling, and blisters.  Moisturize feet and legs daily.  Wear shoes that fit properly and have enough cushioning.  If you have foot problems, report any cuts, sores, or bruises to your health care provider immediately.  Schedule a complete foot exam at least once a year (annually) or more often if you have foot problems. This information is not intended to replace advice given to you by your health care provider. Make sure you discuss any questions you have with your health care provider. Document Revised: 03/01/2019 Document Reviewed: 07/10/2016 Elsevier Patient Education  [place] are small areas of thickened skin that occur on the top, sides, or tip of a toe. They contain a cone-shaped core with a point that can press on a nerve below. This causes pain.  Calluses are areas of thickened skin that can occur anywhere on the body, including the hands, fingers, palms, soles of the feet, and heels. Calluses are usually larger than corns. What are the causes? Corns and calluses are caused by rubbing (friction) or pressure, such as from shoes that are too tight or do not fit properly. What increases the risk? Corns are more likely to develop in people who have misshapen toes (toe deformities), such as hammer toes. Calluses can occur with friction to any area of the skin. They are more likely to develop in people who:  Work with their hands.  Wear shoes that fit poorly, are too tight, or are high-heeled.  Have toe deformities. What are the signs or symptoms? Symptoms of a corn or callus include:  A hard growth on the skin.  Pain or tenderness under the skin.  Redness and swelling.  Increased discomfort while wearing tight-fitting shoes,  if your feet are affected. If a corn or callus becomes infected, symptoms may include:  Redness and swelling that gets worse.  Pain.  Fluid, blood, or pus draining from the corn or callus. How is this diagnosed? Corns and calluses may be diagnosed based on your symptoms, your medical history, and a physical exam. How is this treated? Treatment for corns and calluses may include:  Removing the cause of the friction or pressure. This may involve: ? Changing your shoes. ? Wearing shoe inserts (orthotics) or other protective layers in your shoes, such as a corn pad. ? Wearing gloves.  Applying medicine to the skin (topical medicine) to help soften skin in the hardened, thickened areas.  Removing layers of dead skin with a file to reduce the size of the corn or callus.  Removing the corn or callus with a scalpel or laser.  Taking antibiotic medicines, if your corn or callus is infected.  Having surgery, if a toe deformity is the cause. Follow these instructions at home:   Take over-the-counter and prescription medicines only as told by your health care provider.  If you were prescribed an antibiotic, take it as told by your health care provider. Do not stop taking it even if your condition starts to improve.  Wear shoes that fit well. Avoid wearing high-heeled shoes and shoes that are too tight or too loose.  Wear any padding, protective layers, gloves, or orthotics as told by your health care provider.  Soak your hands or feet and then use a file or pumice stone to soften your corn or callus. Do this as told by your health care provider.  Check your corn or callus every day for symptoms of infection. Contact a health care provider if you:  Notice that your symptoms do not improve with treatment.  Have redness or swelling that gets worse.  Notice that your corn or callus becomes painful.  Have fluid, blood, or pus coming from your corn or callus.  Have new  symptoms. Summary  Corns are small areas of thickened skin that occur on the top, sides, or tip of a toe.  Calluses are areas of thickened skin that can occur anywhere on the body, including the hands, fingers, palms, and soles of the feet. Calluses are usually larger than corns.  Corns and calluses are caused by rubbing (friction) or pressure, such as from shoes that are too tight or do not fit properly.  Treatment may include wearing any padding, protective layers, gloves, or orthotics as told by your health care provider. This information is not intended to replace advice given to you by your health care provider. Make sure you discuss any questions you have with your health care provider. Document Revised: 09/28/2018 Document Reviewed: 04/21/2017 Elsevier Patient Education  [place]  A bunion is a bump on the base of the big toe that forms when the bones of the big toe joint move out of position. Bunions may be small at first, but they often get larger over time. They can make walking painful. What are the causes? A bunion may be caused by:  Wearing narrow or pointed shoes that force the big toe to press against the other toes.  Abnormal foot development that causes the foot to roll inward (pronate).  Changes in the foot that are caused by certain diseases, such as rheumatoid arthritis or polio.  A foot injury. What increases the risk? The following factors may make you more likely to develop this condition:  Wearing shoes that squeeze the toes together.  Having certain diseases, such as: ? Rheumatoid arthritis. ? Polio. ? Cerebral palsy.  Having family members who have bunions.  Being born with a foot deformity, such as flat feet or low arches.  Doing activities that put a lot of pressure on the feet, such as ballet dancing. What are the signs or symptoms? The main symptom of a bunion is a noticeable bump on the big toe. Other symptoms may  include:  Pain.  Swelling around the big toe.  Redness and inflammation.  Thick or hardened skin on the big toe or between the toes.  Stiffness or loss of motion in the  big toe.  Trouble with walking. How is this diagnosed? A bunion may be diagnosed based on your symptoms, medical history, and activities. You may have tests, such as:  X-rays. These allow your health care provider to check the position of the bones in your foot and look for damage to your joint. They also help your health care provider determine the severity of your bunion and the best way to treat it.  Joint aspiration. In this test, a sample of fluid is removed from the toe joint. This test may be done if you are in a lot of pain. It helps rule out diseases that cause painful swelling of the joints, such as arthritis. How is this treated? Treatment depends on the severity of your symptoms. The goal of treatment is to relieve symptoms and prevent the bunion from getting worse. Your health care provider may recommend:  Wearing shoes that have a wide toe box.  Using bunion pads to cushion the affected area.  Taping your toes together to keep them in a normal position.  Placing a device inside your shoe (orthotics) to help reduce pressure on your toe joint.  Taking medicine to ease pain, inflammation, and swelling.  Applying heat or ice to the affected area.  Doing stretching exercises.  Surgery to remove scar tissue and move the toes back into their normal position. This treatment is rare. Follow these instructions at home: Managing pain, stiffness, and swelling   If directed, put ice on the painful area: ? Put ice in a plastic bag. ? Place a towel between your skin and the bag. ? Leave the ice on for 20 minutes, 2-3 times a day. Activity   If directed, apply heat to the affected area before you exercise. Use the heat source that your health care provider recommends, such as a moist heat pack or a  heating pad. ? Place a towel between your skin and the heat source. ? Leave the heat on for 20-30 minutes. ? Remove the heat if your skin turns bright red. This is especially important if you are unable to feel pain, heat, or cold. You may have a greater risk of getting burned.  Do exercises as told by your health care provider. General instructions  Support your toe joint with proper footwear, shoe padding, or taping as told by your health care provider.  Take over-the-counter and prescription medicines only as told by your health care provider.  Keep all follow-up visits as told by your health care provider. This is important. Contact a health care provider if your symptoms:  Get worse.  Do not improve in 2 weeks. Get help right away if you have:  Severe pain and trouble with walking. Summary  A bunion is a bump on the base of the big toe that forms when the bones of the big toe joint move out of position.  Bunions can make walking painful.  Treatment depends on the severity of your symptoms.  Support your toe joint with proper footwear, shoe padding, or taping as told by your health care provider. This information is not intended to replace advice given to you by your health care provider. Make sure you discuss any questions you have with your health care provider. Document Revised: 12/13/2017 Document Reviewed: 10/19/2017 Elsevier Patient Education  [place].

## 2019-10-20 NOTE — Progress Notes (Signed)
Subjective: [name] presents today referred by Antony Contras, MD for diabetic foot evaluation.  Patient relates 10-12 year history of diabetes.  Patient denies any history of foot wounds.  Patient denies any history of numbness, tingling, burning, pins/needles sensations.  Today, patient c/o of painful, discolored, thick toenails and calluses b/l which interfere with daily activities.  Pain is aggravated when wearing enclosed shoe gear.   Past Medical History:  Diagnosis Date  . Anemia 2006   required transfusion post TAH/BSO 05/2005  . Arthritis   . Asthma   . Chronic cough   . Colon polyps    hyperplastic  . Diabetes mellitus without complication ([place])   . Diverticulosis   . GERD (gastroesophageal reflux disease)    on prilosec  . GLA deficiency ([place])   . Glaucoma of both eyes   . Hypertension   . Joint pain   . Osteoarthritis   . Pancreatitis   . Shortness of breath dyspnea   . Vitamin D deficiency     Patient Active Problem List   Diagnosis Date Noted  . Anemia 10/13/2019  . Glaucoma 10/13/2019  . Hypertensive disorder 10/13/2019  . Uterine leiomyoma 10/13/2019  . Vitamin D insufficiency 04/26/2019  . TIA (transient ischemic attack) 03/30/2019  . Hyperlipidemia 03/30/2019  . Thyroid nodule 03/30/2019  . Stroke-like episode s/p IV tPA 03/28/2019  . GERD (gastroesophageal reflux disease) 06/19/2018  . Constipation 06/19/2018  . Other fatigue 08/02/2017  . Shortness of breath on exertion 08/02/2017  . Mass of axilla 07/07/2017  . Abdominal pain, epigastric   . Abnormal CT of the abdomen   . Pancreatitis, acute   . Acute pancreatitis 08/13/2015  . UTI (lower urinary tract infection) 08/13/2015  . Severe obesity (BMI >= 40) ([place]) 01/23/2015  . Sinusitis, chronic 01/08/2015  . Cough variant asthma 01/03/2015  . Arthritis of right hip 12/12/2013  . 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 Benign neoplasm of colon 08/22/2013  . Special  screening for malignant neoplasms, colon 08/22/2013  . Abdominal pain 08/19/2013  . Diabetes mellitus without complication ([place]) [id number]  . Essential hypertension, benign 08/19/2013  . Ventral hernia 08/19/2013    Past Surgical History:  Procedure Laterality Date  . ABDOMINAL HYSTERECTOMY    . BREAST EXCISIONAL BIOPSY Right 1990  . COLONOSCOPY N/A 08/22/2013   Procedure: COLONOSCOPY;  Surgeon: Irene Shipper, MD;  Location: [facility] ENDOSCOPY;  Service: Endoscopy;  Laterality: N/A;  . ESOPHAGOGASTRODUODENOSCOPY N/A 08/22/2013   Procedure: ESOPHAGOGASTRODUODENOSCOPY (EGD);  Surgeon: Irene Shipper, MD;  Location: [facility] ENDOSCOPY;  Service: Endoscopy;  Laterality: N/A;  . ESOPHAGOGASTRODUODENOSCOPY (EGD) WITH PROPOFOL N/A 08/19/2015   Procedure: ESOPHAGOGASTRODUODENOSCOPY (EGD) WITH PROPOFOL;  Surgeon: Irene Shipper, MD;  Location: [facility] ENDOSCOPY;  Service: Endoscopy;  Laterality: N/A;  . HERNIA REPAIR    . HYSTEROSCOPY WITH D & C  01/2005   for uterine fibroids.   . INSERTION OF MESH N/A 08/24/2013   Procedure: INSERTION OF MESH;  Surgeon: Edward Jolly, MD;  Location: [place];  Service: General;  Laterality: N/A;  . JOINT REPLACEMENT Right   . LIPOMA EXCISION Left 06/21/2015   Procedure: EXCISION OF LEFT SCALP LIPOMA;  Surgeon: Johnathan Hausen, MD;  Location: [place];  Service: General;  Laterality: Left;  . PANNICULECTOMY N/A 08/24/2013   Procedure: PANNICULECTOMY;  Surgeon: Edward Jolly, MD;  Location: [place];  Service: General;  Laterality: N/A;  . TOTAL ABDOMINAL HYSTERECTOMY W/ BILATERAL SALPINGOOPHORECTOMY  05/2005  .  TOTAL HIP ARTHROPLASTY Right 12/12/2013   Procedure: RIGHT TOTAL HIP ARTHROPLASTY ANTERIOR APPROACH;  Surgeon: Mcarthur Rossetti, MD;  Location: [place];  Service: Orthopedics;  Laterality: Right;  . VENTRAL HERNIA REPAIR N/A 08/24/2013   Procedure: HERNIA REPAIR VENTRAL ADULT;  Surgeon: Edward Jolly, MD;  Location: [facility] OR;  Service: General;  Laterality:  N/A;    Current Outpatient Medications on File Prior to Visit  Medication Sig Dispense Refill  . albuterol (PROVENTIL) (2.5 MG/3ML) 0.083% nebulizer solution Take 3 mLs (2.5 mg total) by nebulization every 6 (six) hours as needed for wheezing or shortness of breath. 75 mL 0  . aspirin EC 81 MG EC tablet Take 1 tablet (81 mg total) by mouth daily.    Marland Kitchen atorvastatin (LIPITOR) 20 MG tablet Take 1 tablet (20 mg total) by mouth daily at 6 PM. 30 tablet 2  . budesonide-formoterol (SYMBICORT) 80-4.5 MCG/ACT inhaler Inhale 2 puffs into the lungs 2 (two) times daily as needed (wheezing or shortness of breath).    . fluticasone (FLONASE) 50 MCG/ACT nasal spray fluticasone propionate 50 mcg/actuation nasal spray,suspension    . glucose blood test strip Use as instructed 100 each 1  . HYDROcodone-homatropine (HYCODAN) 5-1.5 MG/5ML syrup Take 5 mLs by mouth every 6 (six) hours as needed.    . hydrOXYzine (ATARAX/VISTARIL) 25 MG tablet SMARTSIG:1-2.5 Tablet(s) By Mouth Every 8 Hours PRN    . influenza vac split quadrivalent PF (AFLURIA QUADRIVALENT) 0.5 ML injection Afluria Qd 2020-21 (36 mos up)(PF)60 mcg (15 mcg x4)/0.5 mL IM syringe  PHARMACY ADMINISTERED    . MELATONIN PO Take 1 tablet by mouth at bedtime as needed (sleep).     . metFORMIN (GLUCOPHAGE) 1000 MG tablet Take 1,000 mg by mouth 2 (two) times daily with a meal.    . nystatin ointment (MYCOSTATIN) nystatin 100,000 unit/gram topical ointment  APPLY OINTMENT TOPICALLY TO AFFECTED AREA TWICE DAILY FOR 14 DAYS THEN AS NEEDED    . omeprazole (PRILOSEC) 20 MG capsule omeprazole 20 mg capsule,delayed release    . ondansetron (ZOFRAN ODT) 4 MG disintegrating tablet Take 1 tablet (4 mg total) by mouth every 8 (eight) hours as needed. (Patient not taking: Reported on 05/16/2019) 10 tablet 0  . pioglitazone (ACTOS) 15 MG tablet Take 15 mg by mouth daily.    . Travoprost, BAK Free, (TRAVATAN) 0.004 % SOLN ophthalmic solution     . Vitamin D,  Ergocalciferol, (DRISDOL) 1.25 MG (50000 UT) CAPS capsule Take 1 capsule (50,000 Units total) by mouth every 7 (seven) days. 4 capsule 0   No current facility-administered medications on file prior to visit.     Allergies  Allergen Reactions  . Diclofenac     Hives   . Penicillins Hives    Has patient had a PCN reaction causing immediate rash, facial/tongue/throat swelling, SOB or lightheadedness with hypotension: Yes Has patient had a PCN reaction causing severe rash involving mucus membranes or skin necrosis: Yes Has patient had a PCN reaction that required hospitalization No Has patient had a PCN reaction occurring within the last 10 years: No. If all of the above answers are "NO", then may proceed with Cephalosporin use.     Social History   Occupational History  . Occupation: Education officer, museum  Tobacco Use  . Smoking status: Former Smoker    Packs/day: 0.25    Years: 15.00    Pack years: 3.75    Types: Cigarettes    Quit date: 06/22/1978    Years since  quitting: 41.3  . Smokeless tobacco: Never Used  Substance and Sexual Activity  . Alcohol use: No    Alcohol/week: 0.0 standard drinks  . Drug use: No  . Sexual activity: Not on file    Family History  Problem Relation Age of Onset  . Emphysema Mother        smoked  . Diabetes Mother   . Hypertension Mother   . Colon cancer Father        7-s  . Hypertension Father   . Heart disease Father   . High Cholesterol Father   . Breast cancer Sister     Immunization History  Administered Date(s) Administered  . Influenza Split 03/22/2014  . PFIZER SARS-COV-2 Vaccination 08/19/2019, 09/09/2019   Review of systems: Positive Findings in bold print.  Constitutional:  chills, fatigue, fever, sweats, weight change Communication: Optometrist, sign Ecologist, hand writing, iPad/Android device Head: headaches, head injury Eyes: changes in vision, eye pain, glaucoma, cataracts, macular degeneration, diplopia, glare,   light sensitivity, eyeglasses or contacts, blindness Ears nose mouth throat: hearing impaired, hearing aids,  ringing in ears, deaf, sign language,  vertigo, nosebleeds,  rhinitis,  cold sores, snoring, swollen glands Cardiovascular: HTN, edema, arrhythmia, pacemaker in place, defibrillator in place, chest pain/tightness, chronic anticoagulation, blood clot, heart failure, MI Peripheral Vascular: leg cramps, varicose veins, blood clots, lymphedema, varicosities Respiratory:  difficulty breathing, denies congestion, SOB, wheezing, cough, emphysema Gastrointestinal: change in appetite or weight, abdominal pain, constipation, diarrhea, nausea, vomiting, vomiting blood, change in bowel habits, abdominal pain, jaundice, rectal bleeding, hemorrhoids, GERD Genitourinary:  nocturia,  pain on urination, polyuria,  blood in urine, Foley catheter, urinary urgency, ESRD on hemodialysis Musculoskeletal: amputation, cramping, stiff joints, painful joints, decreased joint motion, fractures, OA, gout, hemiplegia, paraplegia, uses cane, wheelchair bound, uses walker, uses rollator Skin: +changes in toenails, color change, dryness, itching, mole changes,  rash, wound(s) Neurological: headaches, numbness in feet, paresthesias in feet, burning in feet, fainting,  seizures, change in speech,  headaches, memory problems/poor historian, cerebral palsy, weakness, paralysis, CVA, TIA Endocrine: diabetes, hypothyroidism, hyperthyroidism,  goiter, dry mouth, flushing, heat intolerance,  cold intolerance,  excessive thirst, denies polyuria,  nocturia Hematological:  easy bleeding, excessive bleeding, easy bruising, enlarged lymph nodes, on long term blood thinner, history of past transusions Allergy/immunological:  hives, eczema, frequent infections, multiple drug allergies, seasonal allergies, transplant recipient, multiple food allergies Psychiatric:  anxiety, depression, mood disorder, suicidal ideations, hallucinations,  insomnia  Objective: Vitals:   10/13/19 1535  BP: (!) 148/97  Pulse: 60  Temp: 98 F (36.7 C)    64 y.o. pleasant AA female IN NAD. AAO X 3.  Vascular Examination: Capillary refill time to digits immediate b/l. Palpable DP pulses b/l. Palpable PT pulses b/l. Pedal hair sparse b/l. Skin temperature gradient within normal limits b/l. No edema noted b/l.  Dermatological Examination: Pedal skin with normal turgor, texture and tone bilaterally. No open wounds bilaterally. No interdigital macerations bilaterally. Toenails 1-5 b/l elongated, dystrophic, thickened, crumbly with subungual debris and tenderness to dorsal palpation. Hyperkeratotic lesion(s) submet head 5 left foot and submet head 5 right foot.  No erythema, no edema, no drainage, no flocculence.  Musculoskeletal Examination: Normal muscle strength 5/5 to all lower extremity muscle groups bilaterally. No pain crepitus or joint limitation noted with ROM b/l. Hallux valgus with bunion deformity noted b/l. Pes planus deformity noted b/l.   Neurological Examination: Protective sensation intact 5/5 intact bilaterally with 10g monofilament b/l. Vibratory sensation intact b/l. Proprioception intact bilaterally.  Babinski reflex negative b/l. Achilles reflex 2+ b/l. Clonus negative b/l.  Assessment: 1. Pain due to onychomycosis of toenails of both feet   2. Callus   3. Pes planus of both feet   4. Diabetes mellitus without complication ([place])     Plan: -Diabetic foot examination performed on today's visit. -Continue diabetic foot care principles. Literature dispensed on today.  -Toenails 1-5 b/l were debrided in length and girth with sterile nail nippers and dremel without iatrogenic bleeding.  -Callus(es) submet head 5 left foot and submet head 5 right foot pared utilizing sterile scalpel blade without complication or incident. Total number debrided =2. -Patient to continue soft, supportive shoe gear daily. -Patient to report any  pedal injuries to medical professional immediately. We did discuss orthotic therapy to limit pronation and further progression of bunion deformity. Will have O & P check benefits.  -Patient/POA to call should there be question/concern in the interim.  Return in about 3 months (around 01/12/2020) for diabetic nail trim.

## 2019-10-23 ENCOUNTER — Ambulatory Visit: Payer: BC Managed Care – PPO

## 2019-10-27 ENCOUNTER — Ambulatory Visit
Admission: RE | Admit: 2019-10-27 | Discharge: 2019-10-27 | Disposition: A | Discharge status: Home / Self Care | Payer: BC Managed Care – PPO | Source: Ambulatory Visit | Attending: Family Medicine | Admitting: Family Medicine

## 2019-10-27 ENCOUNTER — Other Ambulatory Visit: Payer: Self-pay

## 2019-10-27 DIAGNOSIS — Z1231 Encounter for screening mammogram for malignant neoplasm of breast: Secondary | ICD-10-CM

## 2019-11-14 ENCOUNTER — Encounter: Payer: Self-pay | Admitting: Adult Health

## 2019-11-14 ENCOUNTER — Ambulatory Visit: Payer: BC Managed Care – PPO | Admitting: Adult Health

## 2019-11-14 ENCOUNTER — Other Ambulatory Visit: Payer: Self-pay

## 2019-11-14 VITALS — BP 115/72 | HR 63 | Ht 62.0 in | Wt 209.0 lb

## 2019-11-14 DIAGNOSIS — E119 Type 2 diabetes mellitus without complications: Secondary | ICD-10-CM

## 2019-11-14 DIAGNOSIS — G459 Transient cerebral ischemic attack, unspecified: Secondary | ICD-10-CM

## 2019-11-14 DIAGNOSIS — E785 Hyperlipidemia, unspecified: Secondary | ICD-10-CM

## 2019-11-14 DIAGNOSIS — I10 Essential (primary) hypertension: Secondary | ICD-10-CM

## 2019-11-14 NOTE — Progress Notes (Signed)
I agree with the above plan 

## 2019-11-14 NOTE — Patient Instructions (Signed)
Continue aspirin 81 mg daily  and atorvastatin for secondary stroke prevention  Continue to follow up with PCP regarding cholesterol, blood pressure and diabetes management   Continue to monitor blood pressure at home  Maintain strict control of hypertension with blood pressure goal below 130/90, diabetes with hemoglobin A1c goal below 6.5% and cholesterol with LDL cholesterol (bad cholesterol) goal below 70 mg/dL. I also advised the patient to eat a healthy diet with plenty of whole grains, cereals, fruits and vegetables, exercise regularly and maintain ideal body weight.         Thank you for coming to see [name] at [facility]. I hope we have been able to provide you high quality care today.  You may receive a patient satisfaction survey over the next few weeks. We would appreciate your feedback and comments so that we may continue to improve ourselves and the health of our patients.

## 2019-11-14 NOTE — Progress Notes (Signed)
[facility] [address] [place]. [place] [address] [phone]       STROKE FOLLOW UP NOTE  [name] Date of Birth:  11/06/1955 Medical Record Number:  [id number]   Reason for Referral: TIA follow up    CHIEF COMPLAINT:  Chief Complaint  Patient presents with  . Follow-up    stroke fu, rm 9, alone, pt states she is doing well     HPI:   Today, 11/14/2019, [name] returns for follow up regarding TIA s/p tPA in 03/2019.  She has been stable since prior visit 6 months ago without new or recurring stroke/TIA symptoms.  Continues on aspirin 81 mg daily and atorvastatin for secondary stroke prevention.  Blood pressure today 115/72. Reports have lab work done in March with PCP which were satisfactory (unable to view thru epic).  Continues to follow with PCP for HTN, HLD and DM management.  No concerns at this time.   History provided for reference purposes only Initial visit 05/16/2019 JM: [name] is a 64 year old female who is being seen today for hospital follow-up.  She has been stable from a stroke standpoint without recurring or new stroke/TIA symptoms.  She did undergo FNA biopsy thyroid on 04/26/2019 with biopsy results benign follicular nodule.  Continues on aspirin 81 mg daily without bleeding or bruising.  Continues on atorvastatin without myalgias.  Blood pressure today 124/76.  Glucose levels have been stable.  She continues to follow with her PCP for HTN, HLD and DM management.  Currently being followed by healthy weight and wellness for assistance on weight loss goals.  No concerns at this time.  Stroke admission on 620: [name] a 64 y.o.femalewith history of HTN, DBpresented on 03/28/2019 with L sided numbness and heaviness.  CT head unremarkable andreceived tPA 03/28/2019 at [id number] without complication. MRI negative for acute abnormality.  CTA head/neck negative E LVO without significant stenosis in neck but did show small  left VA with stenosis at the skull base and thyroid nodule.  2D echo normal EF without cardiac source of embolus identified.  Recommended aspirin 81 mg daily.  HTN stabilized with initial elevation in SBP.  Initiated atorvastatin 20 mg daily for LDL 73.  Uncontrolled DM with A1c 7.7 and resumed Amaryl and Metformin and recommend close PCP follow-up.  Other stroke risk factors include former tobacco use and morbid obesity but no prior history of stroke.  Other active problems include evidence of thyroid nodule on CTA with size meeting criteria for FNA and recommended biopsy outpatient.  Discharged home in stable condition without therapy needs.     ROS:   14 system review of systems performed and negative with exception of no complaints  PMH:  Past Medical History:  Diagnosis Date  . Anemia 2006   required transfusion post TAH/BSO 05/2005  . Arthritis   . Asthma   . Chronic cough   . Colon polyps    hyperplastic  . Diabetes mellitus without complication ([place])   . Diverticulosis   . GERD (gastroesophageal reflux disease)    on prilosec  . GLA deficiency ([place])   . Glaucoma of both eyes   . Hypertension   . Joint pain   . Osteoarthritis   . Pancreatitis   . Shortness of breath dyspnea   . Vitamin D deficiency     PSH:  Past Surgical History:  Procedure Laterality Date  . ABDOMINAL HYSTERECTOMY    . BREAST EXCISIONAL BIOPSY Right 1990  .  COLONOSCOPY N/A 08/22/2013   Procedure: COLONOSCOPY;  Surgeon: Irene Shipper, MD;  Location: [facility] ENDOSCOPY;  Service: Endoscopy;  Laterality: N/A;  . ESOPHAGOGASTRODUODENOSCOPY N/A 08/22/2013   Procedure: ESOPHAGOGASTRODUODENOSCOPY (EGD);  Surgeon: Irene Shipper, MD;  Location: [facility] ENDOSCOPY;  Service: Endoscopy;  Laterality: N/A;  . ESOPHAGOGASTRODUODENOSCOPY (EGD) WITH PROPOFOL N/A 08/19/2015   Procedure: ESOPHAGOGASTRODUODENOSCOPY (EGD) WITH PROPOFOL;  Surgeon: Irene Shipper, MD;  Location: [facility] ENDOSCOPY;  Service: Endoscopy;  Laterality: N/A;  . HERNIA  REPAIR    . HYSTEROSCOPY WITH D & C  01/2005   for uterine fibroids.   . INSERTION OF MESH N/A 08/24/2013   Procedure: INSERTION OF MESH;  Surgeon: Edward Jolly, MD;  Location: [place];  Service: General;  Laterality: N/A;  . JOINT REPLACEMENT Right   . LIPOMA EXCISION Left 06/21/2015   Procedure: EXCISION OF LEFT SCALP LIPOMA;  Surgeon: Johnathan Hausen, MD;  Location: [place];  Service: General;  Laterality: Left;  . PANNICULECTOMY N/A 08/24/2013   Procedure: PANNICULECTOMY;  Surgeon: Edward Jolly, MD;  Location: [facility];  Service: General;  Laterality: N/A;  . TOTAL ABDOMINAL HYSTERECTOMY W/ BILATERAL SALPINGOOPHORECTOMY  05/2005  . TOTAL HIP ARTHROPLASTY Right 12/12/2013   Procedure: RIGHT TOTAL HIP ARTHROPLASTY ANTERIOR APPROACH;  Surgeon: Mcarthur Rossetti, MD;  Location: [facility];  Service: Orthopedics;  Laterality: Right;  . VENTRAL HERNIA REPAIR N/A 08/24/2013   Procedure: HERNIA REPAIR VENTRAL ADULT;  Surgeon: Edward Jolly, MD;  Location: [facility] OR;  Service: General;  Laterality: N/A;    Social History:  Social History   Socioeconomic History  . Marital status: Married    Spouse name: Charne Quamme  . Number of children: 5  . Years of education: Not on file  . Highest education level: Not on file  Occupational History  . Occupation: Education officer, museum  Tobacco Use  . Smoking status: Former Smoker    Packs/day: 0.25    Years: 15.00    Pack years: 3.75    Types: Cigarettes    Quit date: 06/22/1978    Years since quitting: 41.4  . Smokeless tobacco: Never Used  Substance and Sexual Activity  . Alcohol use: No    Alcohol/week: 0.0 standard drinks  . Drug use: No  . Sexual activity: Not on file  Other Topics Concern  . Not on file  Social History Narrative  . Not on file   Social Determinants of Health   Financial Resource Strain:   . Difficulty of Paying Living Expenses:   Food Insecurity:   . Worried About Charity fundraiser in the Last  Year:   . Arboriculturist in the Last Year:   Transportation Needs:   . Film/video editor (Medical):   Marland Kitchen Lack of Transportation (Non-Medical):   Physical Activity:   . Days of Exercise per Week:   . Minutes of Exercise per Session:   Stress:   . Feeling of Stress :   Social Connections:   . Frequency of Communication with Friends and Family:   . Frequency of Social Gatherings with Friends and Family:   . Attends Religious Services:   . Active Member of Clubs or Organizations:   . Attends Archivist Meetings:   Marland Kitchen Marital Status:   Intimate Partner Violence:   . Fear of Current or Ex-Partner:   . Emotionally Abused:   Marland Kitchen Physically Abused:   . Sexually Abused:     Family History:  Family  History  Problem Relation Age of Onset  . Emphysema Mother        smoked  . Diabetes Mother   . Hypertension Mother   . Colon cancer Father        7-s  . Hypertension Father   . Heart disease Father   . High Cholesterol Father   . Breast cancer Sister     Medications:   Current Outpatient Medications on File Prior to Visit  Medication Sig Dispense Refill  . albuterol (PROVENTIL) (2.5 MG/3ML) 0.083% nebulizer solution Take 3 mLs (2.5 mg total) by nebulization every 6 (six) hours as needed for wheezing or shortness of breath. 75 mL 0  . aspirin EC 81 MG EC tablet Take 1 tablet (81 mg total) by mouth daily.    Marland Kitchen atorvastatin (LIPITOR) 20 MG tablet Take 1 tablet (20 mg total) by mouth daily at 6 PM. 30 tablet 2  . budesonide-formoterol (SYMBICORT) 80-4.5 MCG/ACT inhaler Inhale 2 puffs into the lungs 2 (two) times daily as needed (wheezing or shortness of breath).    . fluticasone (FLONASE) 50 MCG/ACT nasal spray fluticasone propionate 50 mcg/actuation nasal spray,suspension    . glucose blood test strip Use as instructed 100 each 1  . HYDROcodone-homatropine (HYCODAN) 5-1.5 MG/5ML syrup Take 5 mLs by mouth every 6 (six) hours as needed.    . hydrOXYzine (ATARAX/VISTARIL) 25 MG  tablet SMARTSIG:1-2.5 Tablet(s) By Mouth Every 8 Hours PRN    . influenza vac split quadrivalent PF (AFLURIA QUADRIVALENT) 0.5 ML injection Afluria Qd 2020-21 (36 mos up)(PF)60 mcg (15 mcg x4)/0.5 mL IM syringe  PHARMACY ADMINISTERED    . MELATONIN PO Take 1 tablet by mouth at bedtime as needed (sleep).     . metFORMIN (GLUCOPHAGE) 1000 MG tablet Take 1,000 mg by mouth 2 (two) times daily with a meal.    . nystatin ointment (MYCOSTATIN) nystatin 100,000 unit/gram topical ointment  APPLY OINTMENT TOPICALLY TO AFFECTED AREA TWICE DAILY FOR 14 DAYS THEN AS NEEDED    . omeprazole (PRILOSEC) 20 MG capsule omeprazole 20 mg capsule,delayed release    . pioglitazone (ACTOS) 15 MG tablet Take 15 mg by mouth daily.    . Travoprost, BAK Free, (TRAVATAN) 0.004 % SOLN ophthalmic solution      No current facility-administered medications on file prior to visit.    Allergies:   Allergies  Allergen Reactions  . Diclofenac     Hives   . Penicillins Hives    Has patient had a PCN reaction causing immediate rash, facial/tongue/throat swelling, SOB or lightheadedness with hypotension: Yes Has patient had a PCN reaction causing severe rash involving mucus membranes or skin necrosis: Yes Has patient had a PCN reaction that required hospitalization No Has patient had a PCN reaction occurring within the last 10 years: No. If all of the above answers are "NO", then may proceed with Cephalosporin use.      Physical Exam  Vitals:   11/14/19 0921  BP: 115/72  Pulse: 63  Weight: 209 lb (94.8 kg)  Height: 5\' 2"  (1.575 m)   Body mass index is 38.23 kg/m. No exam data present   General: Pleasant obese middle-aged African-American female, seated, in no evident distress Head: head normocephalic and atraumatic.   Neck: supple with no carotid or supraclavicular bruits Cardiovascular: regular rate and rhythm, no murmurs Musculoskeletal: no deformity Skin:  no rash/petichiae Vascular:  Normal pulses all  extremities   Neurologic Exam Mental Status: Awake and fully alert. Oriented to place and  time. Recent and remote memory intact. Attention span, concentration and fund of knowledge appropriate. Mood and affect appropriate.  Cranial Nerves: Pupils equal, briskly reactive to light. Extraocular movements full without nystagmus. Visual fields full to confrontation. Hearing intact. Facial sensation intact. Face, tongue, palate moves normally and symmetrically.  Motor: Normal bulk and tone. Normal strength in all tested extremity muscles. Sensory.: intact to touch , pinprick , position and vibratory sensation.  Coordination: Rapid alternating movements normal in all extremities. Finger-to-nose and heel-to-shin performed accurately bilaterally. Gait and Station: Arises from chair without difficulty. Stance is normal. Gait demonstrates normal stride length and balance Reflexes: 1+ and symmetric. Toes downgoing.       ASSESSMENT: [name] is a 64 y.o. 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 Vascular risk factors include HTN, HLD, uncontrolled DM, prior history of tobacco use, and morbid obesity.  CTA also showed evidence of thyroid nodule during admission and recently had biopsy which was benign.  Has been stable from a stroke standpoint    PLAN:  1. Right brain TIA: Continue aspirin 81 mg daily  and atorvastatin for secondary stroke prevention. Maintain strict control of hypertension with blood pressure goal below 130/90, diabetes with hemoglobin A1c goal below 6.5% and cholesterol with LDL cholesterol (bad cholesterol) goal below 70 mg/dL.  I also advised the patient to eat a healthy diet with plenty of whole grains, cereals, fruits and vegetables, exercise regularly with at least 30 minutes of continuous activity daily and maintain ideal body weight.  2. HTN: Stable.  Continue to follow with PCP for monitoring and management 3. HLD: Continuation of atorvastatin and continue to follow with PCP for prescribing, monitoring and management 4. DMII: Continue to follow with PCP for monitoring management   Overall stable from stroke standpoint and recommend follow-up as needed   I spent 22 minutes of face-to-face and non-face-to-face time with patient.  This included previsit chart review, lab review, study review, order entry, electronic health record documentation, patient education   Frann Rider, [facility]  [facility] [address] [place], [place] [address]  Phone [phone] Fax [phone] Note: This document was prepared with digital dictation and possible smart phrase technology. Any transcriptional errors that result from this process are unintentional.

## 2019-12-07 ENCOUNTER — Telehealth: Payer: Self-pay | Admitting: Podiatry

## 2019-12-07 NOTE — Telephone Encounter (Signed)
Left message for pt to call to discuss orthotic coverage. 

## 2019-12-12 NOTE — Telephone Encounter (Signed)
Pt returned call and is aware orthotics are covered @ 80%  after deductible(pt stated she has 80/20 plan). She said thank you.

## 2019-12-30 ENCOUNTER — Other Ambulatory Visit: Payer: Self-pay

## 2019-12-30 ENCOUNTER — Emergency Department (HOSPITAL_COMMUNITY)
Admission: EM | Admit: 2019-12-30 | Discharge: 2019-12-30 | Disposition: A | Discharge status: Home / Self Care | Payer: BC Managed Care – PPO | Attending: Emergency Medicine | Admitting: Emergency Medicine

## 2019-12-30 ENCOUNTER — Encounter (HOSPITAL_COMMUNITY): Payer: Self-pay | Admitting: Emergency Medicine

## 2019-12-30 ENCOUNTER — Emergency Department (HOSPITAL_COMMUNITY): Payer: BC Managed Care – PPO

## 2019-12-30 DIAGNOSIS — Z79899 Other long term (current) drug therapy: Secondary | ICD-10-CM | POA: Diagnosis not present

## 2019-12-30 DIAGNOSIS — I10 Essential (primary) hypertension: Secondary | ICD-10-CM | POA: Insufficient documentation

## 2019-12-30 DIAGNOSIS — E119 Type 2 diabetes mellitus without complications: Secondary | ICD-10-CM | POA: Diagnosis not present

## 2019-12-30 DIAGNOSIS — M545 Low back pain, unspecified: Secondary | ICD-10-CM

## 2019-12-30 DIAGNOSIS — W010XXA Fall on same level from slipping, tripping and stumbling without subsequent striking against object, initial encounter: Secondary | ICD-10-CM | POA: Insufficient documentation

## 2019-12-30 DIAGNOSIS — Z7984 Long term (current) use of oral hypoglycemic drugs: Secondary | ICD-10-CM | POA: Diagnosis not present

## 2019-12-30 DIAGNOSIS — Z7951 Long term (current) use of inhaled steroids: Secondary | ICD-10-CM | POA: Diagnosis not present

## 2019-12-30 DIAGNOSIS — J45909 Unspecified asthma, uncomplicated: Secondary | ICD-10-CM | POA: Insufficient documentation

## 2019-12-30 DIAGNOSIS — M25551 Pain in right hip: Secondary | ICD-10-CM | POA: Insufficient documentation

## 2019-12-30 DIAGNOSIS — M25511 Pain in right shoulder: Secondary | ICD-10-CM

## 2019-12-30 DIAGNOSIS — M546 Pain in thoracic spine: Secondary | ICD-10-CM | POA: Insufficient documentation

## 2019-12-30 DIAGNOSIS — Z7982 Long term (current) use of aspirin: Secondary | ICD-10-CM | POA: Insufficient documentation

## 2019-12-30 DIAGNOSIS — W19XXXA Unspecified fall, initial encounter: Secondary | ICD-10-CM

## 2019-12-30 DIAGNOSIS — Z87891 Personal history of nicotine dependence: Secondary | ICD-10-CM | POA: Insufficient documentation

## 2019-12-30 MED ORDER — METHOCARBAMOL 500 MG PO TABS: 500.0000 mg | ORAL_TABLET | Freq: Two times a day (BID) | ORAL | 0 refills | Status: DC

## 2019-12-30 MED ORDER — NAPROXEN 500 MG PO TABS
500.0000 mg | ORAL_TABLET | Freq: Once | ORAL | Status: AC
  Administered 2019-12-30: 500 mg via ORAL
  Filled 2019-12-30: qty 1

## 2019-12-30 MED ORDER — METHOCARBAMOL 500 MG PO TABS
500.0000 mg | ORAL_TABLET | Freq: Once | ORAL | Status: AC
  Administered 2019-12-30: 500 mg via ORAL
  Filled 2019-12-30: qty 1

## 2019-12-30 NOTE — ED Provider Notes (Signed)
[facility] DEPT Provider Note   CSN: [id number] Arrival date & time: 12/30/19  1708     History Chief Complaint  Patient presents with  . Fall  . Hip Pain  . Shoulder Pain    [name] is a 64 y.o. female brought in by EMS who presents with a fall. Patient states that she slipped on water on the floor at [employer] today and fell onto her right side. She has pain in her right shoulder, right hip, and back. Pain does not radiate. She denies weakness, numbness or tingling. She is not on any blood thinners. Patient does have a history of a hip replacement on the right side in 2015. She has not ambulated since the fall. She denies head injury, lightheadedness, dizziness, loss of consciousness. No chest pain, shortness of breath or abdominal pain.  HPI     Past Medical History:  Diagnosis Date  . Anemia 2006   required transfusion post TAH/BSO 05/2005  . Arthritis   . Asthma   . Chronic cough   . Colon polyps    hyperplastic  . Diabetes mellitus without complication ([place])   . Diverticulosis   . GERD (gastroesophageal reflux disease)    on prilosec  . GLA deficiency ([place])   . Glaucoma of both eyes   . Hypertension   . Joint pain   . Osteoarthritis   . Pancreatitis   . Shortness of breath dyspnea   . Vitamin D deficiency     Patient Active Problem List   Diagnosis Date Noted  . Anemia 10/13/2019  . Glaucoma 10/13/2019  . Hypertensive disorder 10/13/2019  . Uterine leiomyoma 10/13/2019  . Vitamin D insufficiency 04/26/2019  . TIA (transient ischemic attack) 03/30/2019  . Hyperlipidemia 03/30/2019  . Thyroid nodule 03/30/2019  . Stroke-like episode s/p IV tPA 03/28/2019  . GERD (gastroesophageal reflux disease) 06/19/2018  . Constipation 06/19/2018  . Other fatigue 08/02/2017  . Shortness of breath on exertion 08/02/2017  . Mass of axilla 07/07/2017  . Abdominal pain, epigastric   . Abnormal CT of the abdomen   . Pancreatitis,  acute   . Acute pancreatitis 08/13/2015  . UTI (lower urinary tract infection) 08/13/2015  . Severe obesity (BMI >= 40) ([place]) 01/23/2015  . Sinusitis, chronic 01/08/2015  . Cough variant asthma 01/03/2015  . Arthritis of right hip 12/12/2013  . 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 Benign neoplasm of colon 08/22/2013  . Special screening for malignant neoplasms, colon 08/22/2013  . Abdominal pain 08/19/2013  . Diabetes mellitus without complication ([place]) 93/26/7124  . Essential hypertension, benign 08/19/2013  . Ventral hernia 08/19/2013    Past Surgical History:  Procedure Laterality Date  . ABDOMINAL HYSTERECTOMY    . BREAST EXCISIONAL BIOPSY Right 1990  . COLONOSCOPY N/A 08/22/2013   Procedure: COLONOSCOPY;  Surgeon: Irene Shipper, MD;  Location: [facility] ENDOSCOPY;  Service: Endoscopy;  Laterality: N/A;  . ESOPHAGOGASTRODUODENOSCOPY N/A 08/22/2013   Procedure: ESOPHAGOGASTRODUODENOSCOPY (EGD);  Surgeon: Irene Shipper, MD;  Location: [facility] ENDOSCOPY;  Service: Endoscopy;  Laterality: N/A;  . ESOPHAGOGASTRODUODENOSCOPY (EGD) WITH PROPOFOL N/A 08/19/2015   Procedure: ESOPHAGOGASTRODUODENOSCOPY (EGD) WITH PROPOFOL;  Surgeon: Irene Shipper, MD;  Location: [facility] ENDOSCOPY;  Service: Endoscopy;  Laterality: N/A;  . HERNIA REPAIR    . HYSTEROSCOPY WITH D & C  01/2005   for uterine fibroids.   . INSERTION OF MESH N/A 08/24/2013   Procedure: INSERTION OF MESH;  Surgeon: Marland Kitchen T  Hoxworth, MD;  Location: [place];  Service: General;  Laterality: N/A;  . JOINT REPLACEMENT Right   . LIPOMA EXCISION Left 06/21/2015   Procedure: EXCISION OF LEFT SCALP LIPOMA;  Surgeon: Johnathan Hausen, MD;  Location: [place];  Service: General;  Laterality: Left;  . PANNICULECTOMY N/A 08/24/2013   Procedure: PANNICULECTOMY;  Surgeon: Edward Jolly, MD;  Location: [place];  Service: General;  Laterality: N/A;  . TOTAL ABDOMINAL HYSTERECTOMY W/ BILATERAL SALPINGOOPHORECTOMY  05/2005  . TOTAL HIP  ARTHROPLASTY Right 12/12/2013   Procedure: RIGHT TOTAL HIP ARTHROPLASTY ANTERIOR APPROACH;  Surgeon: Mcarthur Rossetti, MD;  Location: [place];  Service: Orthopedics;  Laterality: Right;  . VENTRAL HERNIA REPAIR N/A 08/24/2013   Procedure: HERNIA REPAIR VENTRAL ADULT;  Surgeon: Edward Jolly, MD;  Location: [facility] OR;  Service: General;  Laterality: N/A;     OB History    Gravida  5   Para      Term      Preterm      AB      Living  5     SAB      TAB      Ectopic      Multiple      Live Births              Family History  Problem Relation Age of Onset  . Emphysema Mother        smoked  . Diabetes Mother   . Hypertension Mother   . Colon cancer Father        7-s  . Hypertension Father   . Heart disease Father   . High Cholesterol Father   . Breast cancer Sister     Social History   Tobacco Use  . Smoking status: Former Smoker    Packs/day: 0.25    Years: 15.00    Pack years: 3.75    Types: Cigarettes    Quit date: 06/22/1978    Years since quitting: 41.5  . Smokeless tobacco: Never Used  Vaping Use  . Vaping Use: Never used  Substance Use Topics  . Alcohol use: No    Alcohol/week: 0.0 standard drinks  . Drug use: No    Home Medications Prior to Admission medications   Medication Sig Start Date End Date Taking? Authorizing Provider  albuterol (PROVENTIL) (2.5 MG/3ML) 0.083% nebulizer solution Take 3 mLs (2.5 mg total) by nebulization every 6 (six) hours as needed for wheezing or shortness of breath. 06/29/19   Jaynee Eagles, PA-C  aspirin EC 81 MG EC tablet Take 1 tablet (81 mg total) by mouth daily. 03/31/19   Donzetta Starch, NP  atorvastatin (LIPITOR) 20 MG tablet Take 1 tablet (20 mg total) by mouth daily at 6 PM. 03/30/19   Donzetta Starch, NP  budesonide-formoterol (SYMBICORT) 80-4.5 MCG/ACT inhaler Inhale 2 puffs into the lungs 2 (two) times daily as needed (wheezing or shortness of breath). 03/30/19   Donzetta Starch, NP  fluticasone (FLONASE)  50 MCG/ACT nasal spray fluticasone propionate 50 mcg/actuation nasal spray,suspension    [provider]  glucose blood test strip Use as instructed 08/25/17   Dennard Nip D, MD  HYDROcodone-homatropine [facility]) 5-1.5 MG/5ML syrup Take 5 mLs by mouth every 6 (six) hours as needed. 06/22/19   [provider]  hydrOXYzine (ATARAX/VISTARIL) 25 MG tablet SMARTSIG:1-2.5 Tablet(s) By Mouth Every 8 Hours PRN 06/15/19   [provider]  influenza vac split quadrivalent PF (AFLURIA QUADRIVALENT)  0.5 ML injection Afluria Qd 2020-21 (36 mos up)(PF)60 mcg (15 mcg x4)/0.5 mL IM syringe  PHARMACY ADMINISTERED    [provider]  MELATONIN PO Take 1 tablet by mouth at bedtime as needed (sleep).     [provider]  metFORMIN (GLUCOPHAGE) 1000 MG tablet Take 1,000 mg by mouth 2 (two) times daily with a meal.    [provider]  nystatin ointment (MYCOSTATIN) nystatin 100,000 unit/gram topical ointment  APPLY OINTMENT TOPICALLY TO AFFECTED AREA TWICE DAILY FOR 14 DAYS THEN AS NEEDED    [provider]  omeprazole (PRILOSEC) 20 MG capsule omeprazole 20 mg capsule,delayed release    [provider]  pioglitazone (ACTOS) 15 MG tablet Take 15 mg by mouth daily. 09/06/19   [provider]  Travoprost, BAK Free, (TRAVATAN) 0.004 % SOLN ophthalmic solution  08/11/19   [provider]    Allergies    Diclofenac and Penicillins  Review of Systems   Review of Systems  Respiratory: Negative for shortness of breath.   Cardiovascular: Negative for chest pain.  Gastrointestinal: Negative for abdominal pain.  Musculoskeletal: Positive for arthralgias, back pain and myalgias. Negative for neck pain.  Skin: Negative for wound.  Neurological: Negative for dizziness, syncope, weakness, light-headedness, numbness and headaches.  All other systems reviewed and are negative.   Physical Exam Updated Vital Signs BP (!) 150/84 (BP  Location: Left Arm)   Pulse 67   Temp 98.4 F (36.9 C) (Oral)   Resp 16   SpO2 96%   Physical Exam Vitals and nursing note reviewed.  Constitutional:      General: She is not in acute distress.    Appearance: She is well-developed. She is obese. She is not ill-appearing.     Comments: Calm, cooperative, no acute distress  HENT:     Head: Normocephalic and atraumatic.  Eyes:     General: No scleral icterus.       Right eye: No discharge.        Left eye: No discharge.     Conjunctiva/sclera: Conjunctivae normal.     Pupils: Pupils are equal, round, and reactive to light.  Neck:     Comments: No midline tenderness Cardiovascular:     Rate and Rhythm: Normal rate and regular rhythm.  Pulmonary:     Effort: Pulmonary effort is normal. No respiratory distress.     Breath sounds: Normal breath sounds.  Abdominal:     General: There is no distension.     Palpations: Abdomen is soft.     Tenderness: There is no abdominal tenderness.  Musculoskeletal:     Cervical back: Normal range of motion.     Comments: Back: Midline tenderness along the T spine between the scapula and lower L spine.   R shoulder: No signs of trauma. Mild, diffuse tenderness. Normal ROM. 5/5 strength  R Hip: Mild tenderness. Ambulatory with a slight limp  Skin:    General: Skin is warm and dry.  Neurological:     Mental Status: She is alert and oriented to person, place, and time.  Psychiatric:        Behavior: Behavior normal.     ED Results / Procedures / Treatments   Labs (all labs ordered are listed, but only abnormal results are displayed) Labs Reviewed - No data to display  EKG None  Radiology DG Thoracic Spine 2 View  Result Date: 12/30/2019 CLINICAL DATA:  Substance fell, upper back pain, right-sided back pain EXAM: THORACIC  SPINE 2 VIEWS; LUMBAR SPINE - COMPLETE 4+ VIEW COMPARISON:  06/19/2018 FINDINGS: Thoracic spine: Frontal and lateral views demonstrate mild right convex scoliosis  centered at approximately the T6 level. Otherwise alignment is anatomic. There is mild diffuse thoracic spondylosis most pronounced within the midthoracic spine. No acute fractures. Paraspinal soft tissues are unremarkable. Lumbar spine: Frontal, bilateral oblique, and lateral views demonstrate 5 non-rib-bearing lumbar type vertebral bodies. There is mild right convex curvature centered at L3. Otherwise alignment is anatomic. Disc spaces are relatively well preserved. There is mild facet hypertrophy at L4-5 and L5-S1. Sacroiliac joints are normal. IMPRESSION: 1. No acute fracture within the thoracic or lumbar spine. 2. Mild thoracic and lumbar spine scoliosis. Mild multilevel thoracic spondylosis. Electronically Signed   By: Randa Ngo M.D.   On: 12/30/2019 21:00   DG Lumbar Spine Complete  Result Date: 12/30/2019 CLINICAL DATA:  Substance fell, upper back pain, right-sided back pain EXAM: THORACIC SPINE 2 VIEWS; LUMBAR SPINE - COMPLETE 4+ VIEW COMPARISON:  06/19/2018 FINDINGS: Thoracic spine: Frontal and lateral views demonstrate mild right convex scoliosis centered at approximately the T6 level. Otherwise alignment is anatomic. There is mild diffuse thoracic spondylosis most pronounced within the midthoracic spine. No acute fractures. Paraspinal soft tissues are unremarkable. Lumbar spine: Frontal, bilateral oblique, and lateral views demonstrate 5 non-rib-bearing lumbar type vertebral bodies. There is mild right convex curvature centered at L3. Otherwise alignment is anatomic. Disc spaces are relatively well preserved. There is mild facet hypertrophy at L4-5 and L5-S1. Sacroiliac joints are normal. IMPRESSION: 1. No acute fracture within the thoracic or lumbar spine. 2. Mild thoracic and lumbar spine scoliosis. Mild multilevel thoracic spondylosis. Electronically Signed   By: Randa Ngo M.D.   On: 12/30/2019 21:00   DG Shoulder Right  Result Date: 12/30/2019 CLINICAL DATA:  Slipped and fell, right  shoulder pain EXAM: RIGHT SHOULDER - 2+ VIEW COMPARISON:  None. FINDINGS: Frontal, transscapular, and axillary views of the right shoulder are obtained. No fracture, subluxation, or dislocation. Mild hypertrophic changes are seen at the acromioclavicular joint. Right chest is clear. IMPRESSION: 1. Acromioclavicular joint osteoarthritis. 2. No acute fracture. Electronically Signed   By: Randa Ngo M.D.   On: 12/30/2019 18:16   DG Hip Port Unilat W or Wo Pelvis 1 View Right  Result Date: 12/30/2019 CLINICAL DATA:  Slipped and fell, right hip pain EXAM: DG HIP (WITH OR WITHOUT PELVIS) 1V PORT RIGHT COMPARISON:  11/21/2015 FINDINGS: Frontal view of the pelvis as well as frontal and frogleg lateral views of the right hip are obtained. Right hip arthroplasty is in stable position with no evidence of acute complication. No acute displaced fractures. Bony pelvis is unremarkable. IMPRESSION: 1. No acute bony abnormality. Electronically Signed   By: Randa Ngo M.D.   On: 12/30/2019 18:17    Procedures Procedures (including critical care time)  Medications Ordered in ED Medications - No data to display  ED Course  I have reviewed the triage vital signs and the nursing notes.  Pertinent labs & imaging results that were available during my care of the patient were reviewed by me and considered in my medical decision making (see chart for details).  64 year old female presents with mechanical fall at [employer] today and subsequent right shoulder, right hip, and back pain.  Patient has no obvious signs of injury on exam.  She has normal range of motion of her shoulder and hip and is ambulatory.  X-ray of the shoulder and hip are negative for any  acute bony abnormality.  She is tender along the thoracic and lumbar spine.  Will add on x-rays of her back and provide pain control  Back x-rays are negative.  She feels better after naproxen and Robaxin.  We will send in a prescription for her to her pharmacy.   Advised rest, ice/heat, gentle stretching and to follow-up with her doctor or return if worsening  MDM Rules/Calculators/A&P                          Final Clinical Impression(s) / ED Diagnoses Final diagnoses:  Fall, initial encounter  Acute pain of right shoulder  Right hip pain  Acute midline low back pain without sciatica  Acute midline thoracic back pain    Rx / DC Orders ED Discharge Orders    None       Iris Pert 12/30/19 2153    Lacretia Leigh, MD 12/31/19 1550

## 2019-12-30 NOTE — Discharge Instructions (Signed)
Take NSAIDs or Tylenol as needed for the next week. Take this medicine with food. Take muscle relaxer at bedtime to help you sleep. This medicine makes you drowsy so do not take before driving or work Use a heating pad for sore muscles - use for 20 minutes several times a day Try gentle range of motion exercises Return for worsening symptoms  

## 2019-12-30 NOTE — ED Triage Notes (Signed)
Pt was at [employer] and slipped on wet floor on to her right side.

## 2019-12-30 NOTE — ED Triage Notes (Signed)
Patient presents with 7/10 right hip and arm pain after a fall today. HX right hip replacement   EMS vitals: 140/96 BP 98 HR 20 Resp Rate 99% SPO2 on room air 97.1 Temp 87 CBG

## 2020-01-12 ENCOUNTER — Ambulatory Visit: Payer: BC Managed Care – PPO | Admitting: Podiatry

## 2020-03-04 ENCOUNTER — Ambulatory Visit
Admission: RE | Admit: 2020-03-04 | Discharge: 2020-03-04 | Disposition: A | Discharge status: Home / Self Care | Payer: BC Managed Care – PPO | Source: Ambulatory Visit | Attending: Family Medicine | Admitting: Family Medicine

## 2020-03-04 ENCOUNTER — Other Ambulatory Visit: Payer: Self-pay | Admitting: Family Medicine

## 2020-03-04 DIAGNOSIS — R059 Cough, unspecified: Secondary | ICD-10-CM

## 2020-03-26 ENCOUNTER — Ambulatory Visit
Admission: EM | Admit: 2020-03-26 | Discharge: 2020-03-26 | Disposition: A | Discharge status: Home / Self Care | Payer: BC Managed Care – PPO | Attending: Emergency Medicine | Admitting: Emergency Medicine

## 2020-03-26 ENCOUNTER — Other Ambulatory Visit: Payer: Self-pay

## 2020-03-26 DIAGNOSIS — R053 Chronic cough: Secondary | ICD-10-CM | POA: Diagnosis not present

## 2020-03-26 DIAGNOSIS — J45901 Unspecified asthma with (acute) exacerbation: Secondary | ICD-10-CM

## 2020-03-26 MED ORDER — METHYLPREDNISOLONE SODIUM SUCC 125 MG IJ SOLR
125.0000 mg | Freq: Once | INTRAMUSCULAR | Status: AC
  Administered 2020-03-26: 125 mg via INTRAMUSCULAR

## 2020-03-26 MED ORDER — BENZONATATE 100 MG PO CAPS: 100.0000 mg | ORAL_CAPSULE | Freq: Three times a day (TID) | ORAL | 0 refills | Status: DC

## 2020-03-26 NOTE — ED Triage Notes (Signed)
Pt presents with complaints of cough and wheezing x 2 weeks. Pt has had two negative covid tests. Report history of same last year and was diagnosed with an asthma flare up. Reports she had steroids then that helped. Pt states she saw her doctor yesterday who increased her inhalers and gave her cough medication that has not improved anything. Denies any chest pain or shortness of breath.

## 2020-03-26 NOTE — Discharge Instructions (Addendum)

## 2020-03-26 NOTE — ED Provider Notes (Signed)
[facility]    CSN: [id number] Arrival date & time: 03/26/20  1719      History   Chief Complaint Chief Complaint  Patient presents with  . Cough    HPI [name] is a 64 y.o. female  Subjective:   Renee KONKLE is a 64 y.o. female here for evaluation of a cough.  The cough is non-productive, waxing and waning over time and is aggravated by dust and fumes. Onset of symptoms was 3 weeks ago, stable since that time.  Associated symptoms include wheezing and dyspnea since last night. Patient does have a history of asthma. Patient has not had recent travel. Patient does have a history of smoking. Patient  has had a previous chest x-ray. Patient has not had a PPD done. The following portions of the patient's history were reviewed and updated as appropriate: allergies, current medications, past family history, past medical history, past social history, past surgical history and problem list.  Denies CP, SOB in office.      Past Medical History:  Diagnosis Date  . Anemia 2006   required transfusion post TAH/BSO 05/2005  . Arthritis   . Asthma   . Chronic cough   . Colon polyps    hyperplastic  . Diabetes mellitus without complication ([place])   . Diverticulosis   . GERD (gastroesophageal reflux disease)    on prilosec  . GLA deficiency ([place])   . Glaucoma of both eyes   . Hypertension   . Joint pain   . Osteoarthritis   . Pancreatitis   . Shortness of breath dyspnea   . Vitamin D deficiency     Patient Active Problem List   Diagnosis Date Noted  . Anemia 10/13/2019  . Glaucoma 10/13/2019  . Hypertensive disorder 10/13/2019  . Uterine leiomyoma 10/13/2019  . Vitamin D insufficiency 04/26/2019  . TIA (transient ischemic attack) 03/30/2019  . Hyperlipidemia 03/30/2019  . Thyroid nodule 03/30/2019  . Stroke-like episode s/p IV tPA 03/28/2019  . GERD (gastroesophageal reflux disease) 06/19/2018  . Constipation 06/19/2018  . Other fatigue 08/02/2017   . Shortness of breath on exertion 08/02/2017  . Mass of axilla 07/07/2017  . Abdominal pain, epigastric   . Abnormal CT of the abdomen   . Pancreatitis, acute   . Acute pancreatitis 08/13/2015  . UTI (lower urinary tract infection) 08/13/2015  . Severe obesity (BMI >= 40) ([place]) 01/23/2015  . Sinusitis, chronic 01/08/2015  . Cough variant asthma 01/03/2015  . Arthritis of right hip 12/12/2013  . 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 Benign neoplasm of colon 08/22/2013  . Special screening for malignant neoplasms, colon 08/22/2013  . Abdominal pain 08/19/2013  . Diabetes mellitus without complication ([place]) 23/53/6144  . Essential hypertension, benign 08/19/2013  . Ventral hernia 08/19/2013    Past Surgical History:  Procedure Laterality Date  . ABDOMINAL HYSTERECTOMY    . BREAST EXCISIONAL BIOPSY Right 1990  . COLONOSCOPY N/A 08/22/2013   Procedure: COLONOSCOPY;  Surgeon: Irene Shipper, MD;  Location: [facility] ENDOSCOPY;  Service: Endoscopy;  Laterality: N/A;  . ESOPHAGOGASTRODUODENOSCOPY N/A 08/22/2013   Procedure: ESOPHAGOGASTRODUODENOSCOPY (EGD);  Surgeon: Irene Shipper, MD;  Location: [facility] ENDOSCOPY;  Service: Endoscopy;  Laterality: N/A;  . ESOPHAGOGASTRODUODENOSCOPY (EGD) WITH PROPOFOL N/A 08/19/2015   Procedure: ESOPHAGOGASTRODUODENOSCOPY (EGD) WITH PROPOFOL;  Surgeon: Irene Shipper, MD;  Location: [facility] ENDOSCOPY;  Service: Endoscopy;  Laterality: N/A;  . HERNIA REPAIR    . HYSTEROSCOPY WITH D & C  01/2005   for uterine fibroids.   . INSERTION OF MESH N/A 08/24/2013   Procedure: INSERTION OF MESH;  Surgeon: Edward Jolly, MD;  Location: [place];  Service: General;  Laterality: N/A;  . JOINT REPLACEMENT Right   . LIPOMA EXCISION Left 06/21/2015   Procedure: EXCISION OF LEFT SCALP LIPOMA;  Surgeon: Johnathan Hausen, MD;  Location: [place];  Service: General;  Laterality: Left;  . PANNICULECTOMY N/A 08/24/2013   Procedure: PANNICULECTOMY;  Surgeon: Edward Jolly, MD;  Location: [place];  Service: General;  Laterality: N/A;  . TOTAL ABDOMINAL HYSTERECTOMY W/ BILATERAL SALPINGOOPHORECTOMY  05/2005  . TOTAL HIP ARTHROPLASTY Right 12/12/2013   Procedure: RIGHT TOTAL HIP ARTHROPLASTY ANTERIOR APPROACH;  Surgeon: Mcarthur Rossetti, MD;  Location: [place];  Service: Orthopedics;  Laterality: Right;  . VENTRAL HERNIA REPAIR N/A 08/24/2013   Procedure: HERNIA REPAIR VENTRAL ADULT;  Surgeon: Edward Jolly, MD;  Location: [facility] OR;  Service: General;  Laterality: N/A;    OB History    Gravida  5   Para      Term      Preterm      AB      Living  5     SAB      TAB      Ectopic      Multiple      Live Births               Home Medications    Prior to Admission medications   Medication Sig Start Date End Date Taking? Authorizing Provider  albuterol (PROVENTIL) (2.5 MG/3ML) 0.083% nebulizer solution Take 3 mLs (2.5 mg total) by nebulization every 6 (six) hours as needed for wheezing or shortness of breath. 06/29/19   Jaynee Eagles, PA-C  aspirin EC 81 MG EC tablet Take 1 tablet (81 mg total) by mouth daily. 03/31/19   Donzetta Starch, NP  atorvastatin (LIPITOR) 20 MG tablet Take 1 tablet (20 mg total) by mouth daily at 6 PM. 03/30/19   Donzetta Starch, NP  benzonatate (TESSALON) 100 MG capsule Take 1 capsule (100 mg total) by mouth every 8 (eight) hours. 03/26/20   Hall-Potvin, [place], PA-C  budesonide-formoterol (SYMBICORT) 80-4.5 MCG/ACT inhaler Inhale 2 puffs into the lungs 2 (two) times daily as needed (wheezing or shortness of breath). 03/30/19   Donzetta Starch, NP  fluticasone (FLONASE) 50 MCG/ACT nasal spray fluticasone propionate 50 mcg/actuation nasal spray,suspension    [provider]  glucose blood test strip Use as instructed 08/25/17   Dennard Nip D, MD  hydrOXYzine (ATARAX/VISTARIL) 25 MG tablet SMARTSIG:1-2.5 Tablet(s) By Mouth Every 8 Hours PRN 06/15/19   [provider]  influenza vac split  quadrivalent PF (AFLURIA QUADRIVALENT) 0.5 ML injection Afluria Qd 2020-21 (36 mos up)(PF)60 mcg (15 mcg x4)/0.5 mL IM syringe  PHARMACY ADMINISTERED    [provider]  MELATONIN PO Take 1 tablet by mouth at bedtime as needed (sleep).     [provider]  metFORMIN (GLUCOPHAGE) 1000 MG tablet Take 1,000 mg by mouth 2 (two) times daily with a meal.    [provider]  methocarbamol (ROBAXIN) 500 MG tablet Take 1 tablet (500 mg total) by mouth 2 (two) times daily. 12/30/19   Recardo Evangelist, PA-C  nystatin ointment (MYCOSTATIN) nystatin 100,000 unit/gram topical ointment  APPLY OINTMENT TOPICALLY TO AFFECTED AREA TWICE DAILY FOR 14 DAYS THEN AS NEEDED    [provider]  omeprazole (  PRILOSEC) 20 MG capsule omeprazole 20 mg capsule,delayed release    [provider]  pioglitazone (ACTOS) 15 MG tablet Take 15 mg by mouth daily. 09/06/19   [provider]  Travoprost, BAK Free, (TRAVATAN) 0.004 % SOLN ophthalmic solution  08/11/19   [provider]    Family History Family History  Problem Relation Age of Onset  . Emphysema Mother        smoked  . Diabetes Mother   . Hypertension Mother   . Colon cancer Father        7-s  . Hypertension Father   . Heart disease Father   . High Cholesterol Father   . Breast cancer Sister     Social History Social History   Tobacco Use  . Smoking status: Former Smoker    Packs/day: 0.25    Years: 15.00    Pack years: 3.75    Types: Cigarettes    Quit date: 06/22/1978    Years since quitting: 41.7  . Smokeless tobacco: Never Used  Vaping Use  . Vaping Use: Never used  Substance Use Topics  . Alcohol use: No    Alcohol/week: 0.0 standard drinks  . Drug use: No     Allergies   Diclofenac and Penicillins   Review of Systems As per HPI   Physical Exam Triage Vital Signs ED Triage Vitals  Enc Vitals Group     BP      Pulse      Resp      Temp      Temp src      SpO2       Weight      Height      Head Circumference      Peak Flow      Pain Score      Pain Loc      Pain Edu?      Excl. in [place]?    No data found.  Updated Vital Signs BP (!) 149/86   Pulse 77   Temp 98.6 F (37 C)   Resp 19   SpO2 95%   Visual Acuity Right Eye Distance:   Left Eye Distance:   Bilateral Distance:    Right Eye Near:   Left Eye Near:    Bilateral Near:     Physical Exam Constitutional:      General: She is not in acute distress.    Appearance: She is not ill-appearing or diaphoretic.  HENT:     Head: Normocephalic and atraumatic.     Mouth/Throat:     Mouth: Mucous membranes are moist.     Pharynx: Oropharynx is clear. No oropharyngeal exudate or posterior oropharyngeal erythema.  Eyes:     General: No scleral icterus.    Conjunctiva/sclera: Conjunctivae normal.     Pupils: Pupils are equal, round, and reactive to light.  Neck:     Comments: Trachea midline, negative JVD Cardiovascular:     Rate and Rhythm: Normal rate and regular rhythm.     Heart sounds: No murmur heard.  No gallop.   Pulmonary:     Effort: Pulmonary effort is normal. No respiratory distress.     Breath sounds: Wheezing and rhonchi present. No rales.     Comments: Mild, diffuse.  No prolonged expiratory phase Musculoskeletal:     Cervical back: Neck supple. No tenderness.  Lymphadenopathy:     Cervical: No cervical adenopathy.  Skin:    Capillary Refill: Capillary refill takes less  than 2 seconds.     Coloration: Skin is not jaundiced or pale.     Findings: No rash.  Neurological:     General: No focal deficit present.     Mental Status: She is alert and oriented to person, place, and time.      UC Treatments / Results  Labs (all labs ordered are listed, but only abnormal results are displayed) Labs Reviewed - No data to display  EKG   Radiology No results found.  Procedures Procedures (including critical care time)  Medications Ordered in UC Medications    methylPREDNISolone sodium succinate (SOLU-MEDROL) 125 mg/2 mL injection 125 mg (125 mg Intramuscular Given 03/26/20 1745)    Initial Impression / Assessment and Plan / UC Course  I have reviewed the triage vital signs and the nursing notes.  Pertinent labs & imaging results that were available during my care of the patient were reviewed by me and considered in my medical decision making (see chart for details).     Patient afebrile, nontoxic, with SpO2 95%.  Per chart review, CXR >1 wk after sx onset: bronchial thickening, tx'd for bronchitis.  Improved.  Requesting IM solumedrol: given in office.  Pulmonary contact info given for f/u PRN.  Return precautions discussed, patient verbalized understanding and is agreeable to plan. Final Clinical Impressions(s) / UC Diagnoses   Final diagnoses:  Chronic cough  Asthma with acute exacerbation, unspecified asthma severity, unspecified whether persistent     Discharge Instructions     Tessalon for cough. Start flonase, atrovent nasal spray for nasal congestion/drainage. You can use over the counter nasal saline rinse such as neti pot for nasal congestion. Keep hydrated, your urine should be clear to pale yellow in color. Tylenol/motrin for fever and pain. Monitor for any worsening of symptoms, chest pain, shortness of breath, wheezing, swelling of the throat, go to the emergency department for further evaluation needed.     ED Prescriptions    Medication Sig Dispense Auth. Provider   benzonatate (TESSALON) 100 MG capsule Take 1 capsule (100 mg total) by mouth every 8 (eight) hours. 21 capsule Hall-Potvin, [place], PA-C     PDMP not reviewed this encounter.   Hall-Potvin, [place], [place] 03/26/20 1746

## 2020-03-30 ENCOUNTER — Telehealth: Payer: Self-pay | Admitting: Emergency Medicine

## 2020-03-30 MED ORDER — PREDNISONE 50 MG PO TABS: 50.0000 mg | ORAL_TABLET | Freq: Every day | ORAL | 0 refills | Status: DC

## 2020-03-30 NOTE — Telephone Encounter (Signed)
Prednisone not sent from 10/5 visit.  Patient does have follow-up with lung doctor 10/27: Intends to keep.  Confirm pharmacy-prednisone sent.

## 2020-04-17 ENCOUNTER — Encounter: Payer: Self-pay | Admitting: Pulmonary Disease

## 2020-04-17 ENCOUNTER — Ambulatory Visit: Payer: BC Managed Care – PPO | Admitting: Pulmonary Disease

## 2020-04-17 ENCOUNTER — Other Ambulatory Visit: Payer: Self-pay

## 2020-04-17 VITALS — BP 116/72 | HR 80 | Temp 97.7°F | Ht 62.0 in | Wt 201.8 lb

## 2020-04-17 DIAGNOSIS — J4530 Mild persistent asthma, uncomplicated: Secondary | ICD-10-CM

## 2020-04-17 MED ORDER — BUDESONIDE-FORMOTEROL FUMARATE 160-4.5 MCG/ACT IN AERO: 2.0000 | INHALATION_SPRAY | Freq: Two times a day (BID) | RESPIRATORY_TRACT | 12 refills | Status: DC

## 2020-04-17 MED ORDER — MONTELUKAST SODIUM 10 MG PO TABS: 10.0000 mg | ORAL_TABLET | Freq: Every day | ORAL | 11 refills | Status: DC

## 2020-04-17 NOTE — Progress Notes (Signed)
@Patient  ID: [name], female    DOB: 1956-03-28, 64 y.o.   MRN: [id number]  Chief Complaint  Patient presents with  . Consult    Coughing for about a month    Referring provider: Antony Contras, MD  HPI:   64 year old woman who previously followed in pulmonary clinic with Dr. Melvyn Novas with asthma whom we are seen in consultation at request of Marijo File, MD for evaluation of cough, shortness of breath.  Prior pulmonary notes and recent ED notes reviewed.  Patient has been diagnosed with asthma for years.  Usually relatively well controlled.  Uses Symbicort mid dose.  About once or twice a years with changes states since, spring and fall she has worsening breathing.  This occurred earlier in the month.  All of a sudden she had significant chest tightness, wheeze, shortness of breath.  Associated with hacking cough.  Dry.  Went to urgent care and received a dose of IV steroids.  Her symptoms have improved a good deal but still has lingering cough and mild chest tightness, shortness of breath.  No fever or chills.  No sick contacts.  Denies any viral symptoms such as sore throat, runny nose, fever at time of onset of symptoms.  When asked, she does think maybe she had a little more nasal congestion on the days preceding this acute change.  Chest x-ray 03/04/2020 reviewed and interpreted as clear lungs with mild hyperinflation with increased AP diameter on lateral film.  Prior labs reviewed which are notable for elevated IgE greater than 1200 and eosinophils as high as 800 in the past.  PMH: Asthma, diabetes, history of pancreatitis Surgical history: Hysterectomy Family history: Reviewed, denies significant family history of respiratory illness Social history: Former smoker, 4-pack-year smoking history quit 40 years ago, grew up in [place], Education officer, museum to [employer], raising 4 grandchildren all girls   Questionaires / Pulmonary Flowsheets:   ACT:  No flowsheet data  found.  MMRC: No flowsheet data found.  Epworth:  No flowsheet data found.  Tests:   FENO:  No results found for: NITRICOXIDE  PFT: No flowsheet data found.  WALK:  No flowsheet data found.  Imaging: Personally reviewed, per EMR and discussion in this note  Lab Results: Personally reviewed, eosinophils elevated up to 800 in the past, IgE greater than 1200 CBC    Component Value Date/Time   WBC 7.4 03/28/2019 1326   RBC 4.68 03/28/2019 1326   HGB 14.6 03/28/2019 1328   HGB 13.4 08/02/2017 1035   HCT 43.0 03/28/2019 1328   HCT 40.7 08/02/2017 1035   PLT 263 03/28/2019 1326   MCV 90.2 03/28/2019 1326   MCV 86 08/02/2017 1035   MCH 30.6 03/28/2019 1326   MCHC 33.9 03/28/2019 1326   RDW 13.2 03/28/2019 1326   RDW 14.9 08/02/2017 1035   LYMPHSABS 3.5 03/28/2019 1326   LYMPHSABS 2.2 08/02/2017 1035   MONOABS 0.6 03/28/2019 1326   EOSABS 0.4 03/28/2019 1326   EOSABS 0.2 08/02/2017 1035   BASOSABS 0.1 03/28/2019 1326   BASOSABS 0.0 08/02/2017 1035    BMET    Component Value Date/Time   NA 139 03/28/2019 1328   NA 141 08/02/2017 1035   K 4.8 03/28/2019 1328   CL 105 03/28/2019 1328   CO2 24 03/28/2019 1326   GLUCOSE 153 (H) 03/28/2019 1328   BUN 18 03/28/2019 1328   BUN 14 08/02/2017 1035   CREATININE 0.90 03/28/2019 1328   CALCIUM 9.5 03/28/2019 1326  GFRNONAA >60 03/28/2019 1326   GFRAA >60 03/28/2019 1326    BNP No results found for: BNP  ProBNP No results found for: PROBNP  Specialty Problems      Pulmonary Problems   Cough variant asthma    Followed in Pulmonary clinic/ [facility]/ [place]   - allergy profile 01/03/2015 > 0.8, IgE 1256 cat > dog  Dust/ mold - sinus ct > Pos > see sinusitis - 01/17/2015 p extensive coaching HFA effectiveness =   75% > continue symbicort 80 2bid       Sinusitis, chronic    Sinus CT 01/08/2015  Multifocal paranasal sinus disease, most pronounced in the left ethmoid air cell complex and left sphenoid  sinus region. Bilateral ostiomeatal unit obstruction. 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 No air-fluid levels.> referred to ENT 01/17/2015 >>>       Shortness of breath on exertion      Allergies  Allergen Reactions  . Diclofenac     Hives   . Penicillins Hives    Has patient had a PCN reaction causing immediate rash, facial/tongue/throat swelling, SOB or lightheadedness with hypotension: Yes Has patient had a PCN reaction causing severe rash involving mucus membranes or skin necrosis: Yes Has patient had a PCN reaction that required hospitalization No Has patient had a PCN reaction occurring within the last 10 years: No. If all of the above answers are "NO", then may proceed with Cephalosporin use.     Immunization History  Administered Date(s) Administered  . Influenza Split 03/22/2014  . PFIZER SARS-COV-2 Vaccination 08/19/2019, 09/09/2019    Past Medical History:  Diagnosis Date  . Anemia 2006   required transfusion post TAH/BSO 05/2005  . Arthritis   . Asthma   . Chronic cough   . Colon polyps    hyperplastic  . Diabetes mellitus without complication ([place])   . Diverticulosis   . GERD (gastroesophageal reflux disease)    on prilosec  . GLA deficiency ([place])   . Glaucoma of both eyes   . Hypertension   . Joint pain   . Osteoarthritis   . Pancreatitis   . Shortness of breath dyspnea   . Vitamin D deficiency     Tobacco History: Social History   Tobacco Use  Smoking Status Former Smoker  . Packs/day: 0.25  . Years: 15.00  . Pack years: 3.75  . Types: Cigarettes  . Quit date: 06/22/1978  . Years since quitting: 41.8  Smokeless Tobacco Never Used   Counseling given: Not Answered   Continue to not smoke  Outpatient Encounter Medications as of 63/27/2021  Medication Sig  . albuterol (PROVENTIL) (2.5 MG/3ML) 0.083% nebulizer solution Take 3 mLs (2.5 mg total) by nebulization every 6 (six) hours as needed for wheezing or shortness of  breath.  Marland Kitchen aspirin EC 81 MG EC tablet Take 1 tablet (81 mg total) by mouth daily.  Marland Kitchen atorvastatin (LIPITOR) 20 MG tablet Take 1 tablet (20 mg total) by mouth daily at 6 PM.  . benzonatate (TESSALON) 100 MG capsule Take 1 capsule (100 mg total) by mouth every 8 (eight) hours.  . fluticasone (FLONASE) 50 MCG/ACT nasal spray fluticasone propionate 50 mcg/actuation nasal spray,suspension  . glucose blood test strip Use as instructed  . hydrOXYzine (ATARAX/VISTARIL) 25 MG tablet SMARTSIG:1-2.5 Tablet(s) By Mouth Every 8 Hours PRN  . influenza vac split quadrivalent PF (AFLURIA QUADRIVALENT) 0.5 ML injection Afluria Qd 2020-21 (36 mos up)(PF)60 mcg (15 mcg x4)/0.5 mL IM syringe  PHARMACY ADMINISTERED  . MELATONIN PO Take 1 tablet by mouth at bedtime as needed (sleep).   . metFORMIN (GLUCOPHAGE) 1000 MG tablet Take 1,000 mg by mouth 2 (two) times daily with a meal.  . methocarbamol (ROBAXIN) 500 MG tablet Take 1 tablet (500 mg total) by mouth 2 (two) times daily.  Marland Kitchen omeprazole (PRILOSEC) 20 MG capsule omeprazole 20 mg capsule,delayed release  . pioglitazone (ACTOS) 15 MG tablet Take 15 mg by mouth daily.  . Travoprost, BAK Free, (TRAVATAN) 0.004 % SOLN ophthalmic solution   . [DISCONTINUED] budesonide-formoterol (SYMBICORT) 80-4.5 MCG/ACT inhaler Inhale 2 puffs into the lungs 2 (two) times daily as needed (wheezing or shortness of breath).  . budesonide-formoterol (SYMBICORT) 160-4.5 MCG/ACT inhaler Inhale 2 puffs into the lungs in the morning and at bedtime.  . montelukast (SINGULAIR) 10 MG tablet Take 1 tablet (10 mg total) by mouth at bedtime.  Marland Kitchen nystatin ointment (MYCOSTATIN) nystatin 100,000 unit/gram topical ointment  APPLY OINTMENT TOPICALLY TO AFFECTED AREA TWICE DAILY FOR 14 DAYS THEN AS NEEDED (Patient not taking: Reported on 04/17/2020)  . predniSONE (DELTASONE) 50 MG tablet Take 1 tablet (50 mg total) by mouth daily with breakfast. (Patient not taking: Reported on 04/17/2020)   No  facility-administered encounter medications on file as of 04/17/2020.     Review of Systems  Review of Systems  No chest pain with exertion.  No orthopnea or PND.  Comprehensive review of systems otherwise negative. Physical Exam  BP 116/72 (BP Location: Left Arm, Cuff Size: Normal)   Pulse 80   Temp 97.7 F (36.5 C) (Oral)   Ht 5\' 2"  (1.575 m)   Wt 201 lb 12.8 oz (91.5 kg)   SpO2 100%   BMI 36.91 kg/m   Wt Readings from Last 5 Encounters:  04/17/20 201 lb 12.8 oz (91.5 kg)  11/14/19 209 lb (94.8 kg)  05/16/19 217 lb (98.4 kg)  05/08/19 209 lb (94.8 kg)  04/24/19 210 lb (95.3 kg)    BMI Readings from Last 5 Encounters:  04/17/20 36.91 kg/m  11/14/19 38.23 kg/m  05/16/19 39.69 kg/m  05/08/19 38.23 kg/m  04/24/19 38.41 kg/m     Physical Exam General: Well-appearing, no acute distress Eyes: EOMI PERRLA Neck: Supple, no JVD Respiratory: Diffuse faint end expiratory wheezes left greater than right, scattered inspiratory squeak, normal work of breathing Cardiovascular: Regular rate and rhythm, no murmurs Abdomen: Nondistended, bowel sounds present MSK: No synovitis, no joint effusion Neuro: Normal gait, no weakness Psych: Normal mood, full affect   Assessment & Plan:   Cough: Associated chest tightness, SOB. Related to poorly controlled asthma. Improved after steroid dose in ED. See below.  Asthma: Elevated Eos and IgE in past. Suspect flare with worsened seasonal allergies, pollens. Increase to high dose symbicort. Add montelukast. If symptoms not significantly improving, will repeat IgE and Eos as she may require biologic therapy.    Return in about 6 weeks (around 05/29/2020).   Lanier Clam, MD 04/17/2020

## 2020-04-17 NOTE — Patient Instructions (Addendum)
Use your current Symbicort 2 puffs  4 times a a day until you run out.   Then use the high dose (new prescription) Symbicort 2 puffs twice a day.  I prescribed a new pill - montelukast. Take 1 pill once a day at night.   Come back in 6 weeks with Dr. Silas Flood

## 2020-05-03 ENCOUNTER — Other Ambulatory Visit: Payer: Self-pay

## 2020-05-03 ENCOUNTER — Telehealth: Payer: Self-pay | Admitting: Pulmonary Disease

## 2020-05-03 ENCOUNTER — Ambulatory Visit (HOSPITAL_COMMUNITY)
Admission: EM | Admit: 2020-05-03 | Discharge: 2020-05-03 | Disposition: A | Discharge status: Home / Self Care | Payer: BC Managed Care – PPO | Attending: Emergency Medicine | Admitting: Emergency Medicine

## 2020-05-03 ENCOUNTER — Ambulatory Visit (INDEPENDENT_AMBULATORY_CARE_PROVIDER_SITE_OTHER): Payer: BC Managed Care – PPO

## 2020-05-03 ENCOUNTER — Encounter (HOSPITAL_COMMUNITY): Payer: Self-pay | Admitting: Emergency Medicine

## 2020-05-03 DIAGNOSIS — J4531 Mild persistent asthma with (acute) exacerbation: Secondary | ICD-10-CM

## 2020-05-03 DIAGNOSIS — R0602 Shortness of breath: Secondary | ICD-10-CM | POA: Diagnosis not present

## 2020-05-03 DIAGNOSIS — R059 Cough, unspecified: Secondary | ICD-10-CM

## 2020-05-03 MED ORDER — PREDNISONE 50 MG PO TABS: 50.0000 mg | ORAL_TABLET | Freq: Every day | ORAL | 0 refills | Status: AC

## 2020-05-03 MED ORDER — METHYLPREDNISOLONE SODIUM SUCC 125 MG IJ SOLR
INTRAMUSCULAR | Status: AC
  Filled 2020-05-03: qty 2

## 2020-05-03 MED ORDER — DM-GUAIFENESIN ER 30-600 MG PO TB12: 1.0000 | ORAL_TABLET | Freq: Two times a day (BID) | ORAL | 0 refills | Status: DC

## 2020-05-03 MED ORDER — METHYLPREDNISOLONE SODIUM SUCC 125 MG IJ SOLR
125.0000 mg | Freq: Once | INTRAMUSCULAR | Status: AC
  Administered 2020-05-03: 125 mg via INTRAMUSCULAR

## 2020-05-03 MED ORDER — BENZONATATE 200 MG PO CAPS: 200.0000 mg | ORAL_CAPSULE | Freq: Three times a day (TID) | ORAL | 0 refills | Status: AC | PRN

## 2020-05-03 NOTE — ED Provider Notes (Signed)
[name]    CSN: [id number] Arrival date & time: 05/03/20  1244      History   Chief Complaint Chief Complaint  Patient presents with  . Shortness of Breath    HPI [name] is a 64 y.o. female history of asthma, GERD, hypertension, presenting today for evaluation of cough and shortness of breath.  Patient reports over the past 2 to 3 months she has had a cough that has come and gone.  Over the past 2 days she has developed increased mucus cough wheezing and shortness of breath.  Saw pulmonologist recently on 10/27 and started Singulair.  Symptoms had improved.  She denies any fevers chills or body aches.  Using Symbicort and nebulizers at home without relief.  Reports recent blood sugar this morning was 107.  HPI  Past Medical History:  Diagnosis Date  . Anemia 2006   required transfusion post TAH/BSO 05/2005  . Arthritis   . Asthma   . Chronic cough   . Colon polyps    hyperplastic  . Diabetes mellitus without complication ([place])   . Diverticulosis   . GERD (gastroesophageal reflux disease)    on prilosec  . GLA deficiency ([place])   . Glaucoma of both eyes   . Hypertension   . Joint pain   . Osteoarthritis   . Pancreatitis   . Shortness of breath dyspnea   . Vitamin D deficiency     Patient Active Problem List   Diagnosis Date Noted  . Anemia 10/13/2019  . Glaucoma 10/13/2019  . Hypertensive disorder 10/13/2019  . Uterine leiomyoma 10/13/2019  . Vitamin D insufficiency 04/26/2019  . TIA (transient ischemic attack) 03/30/2019  . Hyperlipidemia 03/30/2019  . Thyroid nodule 03/30/2019  . Stroke-like episode s/p IV tPA 03/28/2019  . GERD (gastroesophageal reflux disease) 06/19/2018  . Constipation 06/19/2018  . Other fatigue 08/02/2017  . Shortness of breath on exertion 08/02/2017  . Mass of axilla 07/07/2017  . Abdominal pain, epigastric   . Abnormal CT of the abdomen   . Pancreatitis, acute   . Acute pancreatitis 08/13/2015  . UTI  (lower urinary tract infection) 08/13/2015  . Severe obesity (BMI >= 40) ([place]) 01/23/2015  . Sinusitis, chronic 01/08/2015  . Cough variant asthma 01/03/2015  . Arthritis of right hip 12/12/2013  . 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 Benign neoplasm of colon 08/22/2013  . Special screening for malignant neoplasms, colon 08/22/2013  . Abdominal pain 08/19/2013  . Diabetes mellitus without complication ([place]) 70/62/3762  . Essential hypertension, benign 08/19/2013  . Ventral hernia 08/19/2013    Past Surgical History:  Procedure Laterality Date  . ABDOMINAL HYSTERECTOMY    . BREAST EXCISIONAL BIOPSY Right 1990  . COLONOSCOPY N/A 08/22/2013   Procedure: COLONOSCOPY;  Surgeon: Irene Shipper, MD;  Location: [facility] ENDOSCOPY;  Service: Endoscopy;  Laterality: N/A;  . ESOPHAGOGASTRODUODENOSCOPY N/A 08/22/2013   Procedure: ESOPHAGOGASTRODUODENOSCOPY (EGD);  Surgeon: Irene Shipper, MD;  Location: [facility] ENDOSCOPY;  Service: Endoscopy;  Laterality: N/A;  . ESOPHAGOGASTRODUODENOSCOPY (EGD) WITH PROPOFOL N/A 08/19/2015   Procedure: ESOPHAGOGASTRODUODENOSCOPY (EGD) WITH PROPOFOL;  Surgeon: Irene Shipper, MD;  Location: [facility] ENDOSCOPY;  Service: Endoscopy;  Laterality: N/A;  . HERNIA REPAIR    . HYSTEROSCOPY WITH D & C  01/2005   for uterine fibroids.   . INSERTION OF MESH N/A 08/24/2013   Procedure: INSERTION OF MESH;  Surgeon: Edward Jolly, MD;  Location: [place];  Service: General;  Laterality:  N/A;  . JOINT REPLACEMENT Right   . LIPOMA EXCISION Left 06/21/2015   Procedure: EXCISION OF LEFT SCALP LIPOMA;  Surgeon: Johnathan Hausen, MD;  Location: [place];  Service: General;  Laterality: Left;  . PANNICULECTOMY N/A 08/24/2013   Procedure: PANNICULECTOMY;  Surgeon: Edward Jolly, MD;  Location: [place];  Service: General;  Laterality: N/A;  . TOTAL ABDOMINAL HYSTERECTOMY W/ BILATERAL SALPINGOOPHORECTOMY  05/2005  . TOTAL HIP ARTHROPLASTY Right 12/12/2013   Procedure: RIGHT  TOTAL HIP ARTHROPLASTY ANTERIOR APPROACH;  Surgeon: Mcarthur Rossetti, MD;  Location: [place];  Service: Orthopedics;  Laterality: Right;  . VENTRAL HERNIA REPAIR N/A 08/24/2013   Procedure: HERNIA REPAIR VENTRAL ADULT;  Surgeon: Edward Jolly, MD;  Location: [facility] OR;  Service: General;  Laterality: N/A;    OB History    Gravida  5   Para      Term      Preterm      AB      Living  5     SAB      TAB      Ectopic      Multiple      Live Births               Home Medications    Prior to Admission medications   Medication Sig Start Date End Date Taking? Authorizing Provider  albuterol (PROVENTIL) (2.5 MG/3ML) 0.083% nebulizer solution Take 3 mLs (2.5 mg total) by nebulization every 6 (six) hours as needed for wheezing or shortness of breath. 06/29/19  Yes Jaynee Eagles, PA-C  aspirin EC 81 MG EC tablet Take 1 tablet (81 mg total) by mouth daily. 03/31/19  Yes Donzetta Starch, NP  atorvastatin (LIPITOR) 20 MG tablet Take 1 tablet (20 mg total) by mouth daily at 6 PM. 03/30/19  Yes Biby, Massie Kluver, NP  budesonide-formoterol (SYMBICORT) 160-4.5 MCG/ACT inhaler Inhale 2 puffs into the lungs in the morning and at bedtime. 04/17/20  Yes Hunsucker, Bonna Gains, MD  fluticasone (FLONASE) 50 MCG/ACT nasal spray fluticasone propionate 50 mcg/actuation nasal spray,suspension   Yes [provider]  MELATONIN PO Take 1 tablet by mouth at bedtime as needed (sleep).    Yes [provider]  metFORMIN (GLUCOPHAGE) 1000 MG tablet Take 1,000 mg by mouth 2 (two) times daily with a meal.   Yes [provider]  montelukast (SINGULAIR) 10 MG tablet Take 1 tablet (10 mg total) by mouth at bedtime. 04/17/20  Yes Hunsucker, Bonna Gains, MD  omeprazole (PRILOSEC) 20 MG capsule omeprazole 20 mg capsule,delayed release   Yes [provider]  pioglitazone (ACTOS) 15 MG tablet Take 15 mg by mouth daily. 09/06/19  Yes [provider]  Travoprost, BAK Free,  (TRAVATAN) 0.004 % SOLN ophthalmic solution  08/11/19  Yes [provider]  benzonatate (TESSALON) 200 MG capsule Take 1 capsule (200 mg total) by mouth 3 (three) times daily as needed for up to 7 days for cough. 05/03/20 05/10/20  Gladies Sofranko C, PA-C  dextromethorphan-guaiFENesin (MUCINEX DM) 30-600 MG 12hr tablet Take 1 tablet by mouth 2 (two) times daily. 05/03/20   Hicks Feick C, PA-C  glucose blood test strip Use as instructed 08/25/17   Dennard Nip D, MD  influenza vac split quadrivalent PF (AFLURIA QUADRIVALENT) 0.5 ML injection Afluria Qd 2020-21 (36 mos up)(PF)60 mcg (15 mcg x4)/0.5 mL IM syringe  PHARMACY ADMINISTERED    [provider]  predniSONE (DELTASONE) 50 MG tablet Take 1  tablet (50 mg total) by mouth daily with breakfast for 5 days. 05/03/20 05/08/20  Charolette Bultman, Elesa Hacker, PA-C    Family History Family History  Problem Relation Age of Onset  . Emphysema Mother        smoked  . Diabetes Mother   . Hypertension Mother   . Colon cancer Father        7-s  . Hypertension Father   . Heart disease Father   . High Cholesterol Father   . Breast cancer Sister     Social History Social History   Tobacco Use  . Smoking status: Former Smoker    Packs/day: 0.25    Years: 15.00    Pack years: 3.75    Types: Cigarettes    Quit date: 06/22/1978    Years since quitting: 41.8  . Smokeless tobacco: Never Used  Vaping Use  . Vaping Use: Never used  Substance Use Topics  . Alcohol use: No    Alcohol/week: 0.0 standard drinks  . Drug use: No     Allergies   Naproxen, Diclofenac, and Penicillins   Review of Systems Review of Systems  Constitutional: Negative for activity change, appetite change, chills, fatigue and fever.  HENT: Positive for congestion and rhinorrhea. Negative for ear pain, sinus pressure, sore throat and trouble swallowing.   Eyes: Negative for discharge and redness.  Respiratory: Positive for cough, chest tightness, shortness of  breath and wheezing.   Cardiovascular: Negative for chest pain.  Gastrointestinal: Negative for abdominal pain, diarrhea, nausea and vomiting.  Musculoskeletal: Negative for myalgias.  Skin: Negative for rash.  Neurological: Negative for dizziness, light-headedness and headaches.     Physical Exam Triage Vital Signs ED Triage Vitals  Enc Vitals Group     BP 05/03/20 1300 133/70     Pulse Rate 05/03/20 1300 92     Resp 05/03/20 1300 16     Temp 05/03/20 1300 97.6 F (36.4 C)     Temp Source 05/03/20 1300 Oral     SpO2 05/03/20 1300 100 %     Weight --      Height --      Head Circumference --      Peak Flow --      Pain Score 05/03/20 1256 0     Pain Loc --      Pain Edu? --      Excl. in [place]? --    No data found.  Updated Vital Signs BP 133/70 (BP Location: Right Arm)   Pulse 92   Temp 97.6 F (36.4 C) (Oral)   Resp 16   SpO2 100%   Visual Acuity Right Eye Distance:   Left Eye Distance:   Bilateral Distance:    Right Eye Near:   Left Eye Near:    Bilateral Near:     Physical Exam Vitals and nursing note reviewed.  Constitutional:      Appearance: She is well-developed.     Comments: No acute distress  HENT:     Head: Normocephalic and atraumatic.     Ears:     Comments: Bilateral ears without tenderness to palpation of external auricle, tragus and mastoid, EAC's without erythema or swelling, TM's with good bony landmarks and cone of light. Non erythematous.    Nose: Nose normal.     Mouth/Throat:     Comments: Oral mucosa pink and moist, no tonsillar enlargement or exudate. Posterior pharynx patent and nonerythematous, no uvula deviation or swelling. Normal phonation. Eyes:  Conjunctiva/sclera: Conjunctivae normal.  Cardiovascular:     Rate and Rhythm: Normal rate.  Pulmonary:     Effort: Pulmonary effort is normal. No respiratory distress.     Comments: Significant inspiratory and expiratory wheezing noted throughout all lung fields with some  rhonchi, after Solu-Medrol injection adventitious sounds significantly improved, mild inspiratory and expiratory wheezing noted throughout; appears to be breathing more comfortably, no respiratory distress Abdominal:     General: There is no distension.  Musculoskeletal:        General: Normal range of motion.     Cervical back: Neck supple.  Skin:    General: Skin is warm and dry.  Neurological:     Mental Status: She is alert and oriented to person, place, and time.      UC Treatments / Results  Labs (all labs ordered are listed, but only abnormal results are displayed) Labs Reviewed - No data to display  EKG   Radiology DG Chest 2 View  Result Date: 05/03/2020 CLINICAL DATA:  Cough for months.  Recently worsening. EXAM: CHEST - 2 VIEW COMPARISON:  03/04/2020 FINDINGS: The heart size and mediastinal contours are within normal limits. Both lungs are clear. The visualized skeletal structures are unremarkable. IMPRESSION: No active cardiopulmonary disease. Electronically Signed   By: Kathreen Devoid   On: 05/03/2020 13:46    Procedures Procedures (including critical care time)  Medications Ordered in UC Medications  methylPREDNISolone sodium succinate (SOLU-MEDROL) 125 mg/2 mL injection 125 mg (125 mg Intramuscular Given 05/03/20 1320)    Initial Impression / Assessment and Plan / UC Course  I have reviewed the triage vital signs and the nursing notes.  Pertinent labs & imaging results that were available during my care of the patient were reviewed by me and considered in my medical decision making (see chart for details).    Asthma Exacerbation: Chest x-ray without signs of pneumonia, treating for asthma exacerbation with Solu-Medrol IM prior to discharge, breathing and wheezing significantly improved, will continue on prednisone x5 days, continue inhalers and nebulizers at home, Tessalon and Mucinex DM for further cough/congestion relief.  Continue to monitor,Discussed  strict return precautions. Patient verbalized understanding and is agreeable with plan.   Final Clinical Impressions(s) / UC Diagnoses   Final diagnoses:  SOB (shortness of breath)  Mild persistent asthma with exacerbation     Discharge Instructions     Chest x-ray without signs of pneumonia We gave you a dose of Solu-Medrol today, continue with prednisone daily for 5 days with food Tessalon/benzonatate every 8 hours as needed for cough Mucinex DM twice daily for congestion/cough Continue inhalers and nebulizers at home Follow-up if any symptoms not improving or worsening    ED Prescriptions    Medication Sig Dispense Auth. Provider   predniSONE (DELTASONE) 50 MG tablet Take 1 tablet (50 mg total) by mouth daily with breakfast for 5 days. 5 tablet Jood Retana C, PA-C   benzonatate (TESSALON) 200 MG capsule Take 1 capsule (200 mg total) by mouth 3 (three) times daily as needed for up to 7 days for cough. 28 capsule Curtiss Mahmood C, PA-C   dextromethorphan-guaiFENesin (MUCINEX DM) 30-600 MG 12hr tablet Take 1 tablet by mouth 2 (two) times daily. 20 tablet Shereda Graw, [place] C, PA-C     PDMP not reviewed this encounter.   Janith Lima, PA-C 05/03/20 1419

## 2020-05-03 NOTE — Telephone Encounter (Signed)
Called and spoke to patient.  Patient stated that her cough has worsen since her last OV with Dr. Silas Flood on 04/17/2020. She also has experiencing increased sob, chest tightness and nasal congestion.  Sx have been present for 2d.  She is using albuterol solution Q3H with temporary relief.   Not using any OTC meds to help with sx.  She had had both covid vaccines and flu shot.  Dr. Melvyn Novas, please advise  As Dr. Silas Flood is not available. Thanks  Current Outpatient Medications on File Prior to Visit  Medication Sig Dispense Refill  . albuterol (PROVENTIL) (2.5 MG/3ML) 0.083% nebulizer solution Take 3 mLs (2.5 mg total) by nebulization every 6 (six) hours as needed for wheezing or shortness of breath. 75 mL 0  . aspirin EC 81 MG EC tablet Take 1 tablet (81 mg total) by mouth daily.    Marland Kitchen atorvastatin (LIPITOR) 20 MG tablet Take 1 tablet (20 mg total) by mouth daily at 6 PM. 30 tablet 2  . benzonatate (TESSALON) 100 MG capsule Take 1 capsule (100 mg total) by mouth every 8 (eight) hours. 21 capsule 0  . budesonide-formoterol (SYMBICORT) 160-4.5 MCG/ACT inhaler Inhale 2 puffs into the lungs in the morning and at bedtime. 1 each 12  . fluticasone (FLONASE) 50 MCG/ACT nasal spray fluticasone propionate 50 mcg/actuation nasal spray,suspension    . glucose blood test strip Use as instructed 100 each 1  . hydrOXYzine (ATARAX/VISTARIL) 25 MG tablet SMARTSIG:1-2.5 Tablet(s) By Mouth Every 8 Hours PRN    . influenza vac split quadrivalent PF (AFLURIA QUADRIVALENT) 0.5 ML injection Afluria Qd 2020-21 (36 mos up)(PF)60 mcg (15 mcg x4)/0.5 mL IM syringe  PHARMACY ADMINISTERED    . MELATONIN PO Take 1 tablet by mouth at bedtime as needed (sleep).     . metFORMIN (GLUCOPHAGE) 1000 MG tablet Take 1,000 mg by mouth 2 (two) times daily with a meal.    . methocarbamol (ROBAXIN) 500 MG tablet Take 1 tablet (500 mg total) by mouth 2 (two) times daily. 20 tablet 0  . montelukast (SINGULAIR) 10 MG tablet Take 1 tablet  (10 mg total) by mouth at bedtime. 30 tablet 11  . nystatin ointment (MYCOSTATIN) nystatin 100,000 unit/gram topical ointment  APPLY OINTMENT TOPICALLY TO AFFECTED AREA TWICE DAILY FOR 14 DAYS THEN AS NEEDED (Patient not taking: Reported on 04/17/2020)    . omeprazole (PRILOSEC) 20 MG capsule omeprazole 20 mg capsule,delayed release    . pioglitazone (ACTOS) 15 MG tablet Take 15 mg by mouth daily.    . predniSONE (DELTASONE) 50 MG tablet Take 1 tablet (50 mg total) by mouth daily with breakfast. (Patient not taking: Reported on 04/17/2020) 5 tablet 0  . Travoprost, BAK Free, (TRAVATAN) 0.004 % SOLN ophthalmic solution      No current facility-administered medications on file prior to visit.    Allergies  Allergen Reactions  . Diclofenac     Hives   . Penicillins Hives    Has patient had a PCN reaction causing immediate rash, facial/tongue/throat swelling, SOB or lightheadedness with hypotension: Yes Has patient had a PCN reaction causing severe rash involving mucus membranes or skin necrosis: Yes Has patient had a PCN reaction that required hospitalization No Has patient had a PCN reaction occurring within the last 10 years: No. If all of the above answers are "NO", then may proceed with Cephalosporin use.

## 2020-05-03 NOTE — Telephone Encounter (Signed)
If prednisone helped her cough in past, ok to give Prednisone 10 mg take  4 each am x 2 days,   2 each am x 2 days,  1 each am x 2 days and stop   Also ok to use nebulizer short term up to every 4 hours if helps  If tessalon helps can try the 200 mg q 8 h prn also   Nothing else to suggest over the phone > needs ov next avail to sort out

## 2020-05-03 NOTE — Discharge Instructions (Signed)
Chest x-ray without signs of pneumonia We gave you a dose of Solu-Medrol today, continue with prednisone daily for 5 days with food Tessalon/benzonatate every 8 hours as needed for cough Mucinex DM twice daily for congestion/cough Continue inhalers and nebulizers at home Follow-up if any symptoms not improving or worsening

## 2020-05-03 NOTE — ED Triage Notes (Signed)
Patient c/o wheezing and productive cough "thick yellow mucus" since yesterday.  Patient endorses cough x 2 months. Patient states "I've seen pulmonologist for cough issues".  Patient took 3 nebulizer treatments and Symbicort at home w/ no relief of symptoms.   History of Asthma.

## 2020-05-03 NOTE — Telephone Encounter (Signed)
Called and spoke with Patient.  Dr. Gustavus Bryant recommendations given.  Patient stated she was already taking Tessalon, with no help, Symbicort twice a day, using nebs every 3 hours, and nothing is helping.  Patient declined OV at this time, and stated she would go to Urgent Care.

## 2020-06-14 ENCOUNTER — Other Ambulatory Visit (INDEPENDENT_AMBULATORY_CARE_PROVIDER_SITE_OTHER): Payer: Self-pay | Admitting: Bariatrics

## 2020-06-14 DIAGNOSIS — E559 Vitamin D deficiency, unspecified: Secondary | ICD-10-CM

## 2020-06-19 ENCOUNTER — Other Ambulatory Visit (HOSPITAL_COMMUNITY): Payer: Self-pay

## 2020-08-12 ENCOUNTER — Ambulatory Visit (INDEPENDENT_AMBULATORY_CARE_PROVIDER_SITE_OTHER): Payer: BC Managed Care – PPO | Admitting: Orthopaedic Surgery

## 2020-08-12 ENCOUNTER — Encounter: Payer: Self-pay | Admitting: Orthopaedic Surgery

## 2020-08-12 ENCOUNTER — Other Ambulatory Visit: Payer: Self-pay

## 2020-08-12 ENCOUNTER — Ambulatory Visit: Payer: BC Managed Care – PPO | Admitting: Orthopaedic Surgery

## 2020-08-12 ENCOUNTER — Ambulatory Visit: Payer: Self-pay

## 2020-08-12 VITALS — Ht 62.0 in | Wt 188.0 lb

## 2020-08-12 DIAGNOSIS — M25552 Pain in left hip: Secondary | ICD-10-CM

## 2020-08-12 DIAGNOSIS — M25551 Pain in right hip: Secondary | ICD-10-CM

## 2020-08-12 MED ORDER — METHOCARBAMOL 750 MG PO TABS: 750.0000 mg | ORAL_TABLET | Freq: Three times a day (TID) | ORAL | 1 refills | Status: DC | PRN

## 2020-08-12 MED ORDER — MELOXICAM 7.5 MG PO TABS: 7.5000 mg | ORAL_TABLET | Freq: Two times a day (BID) | ORAL | 1 refills | Status: DC | PRN

## 2020-08-12 MED ORDER — TRAMADOL HCL 50 MG PO TABS
100.0000 mg | ORAL_TABLET | Freq: Four times a day (QID) | ORAL | 0 refills | Status: DC | PRN
Start: 2020-08-12 — End: 2020-10-30

## 2020-08-12 NOTE — Progress Notes (Signed)
Office Visit Note   Patient: [name]           Date of Birth: 05-20-1956           MRN: [id number] Visit Date: 08/12/2020              Requested by: Antony Contras, MD [address],  [place] [address] PCP: Antony Contras, MD   Assessment & Plan: Visit Diagnoses:  1. Pain in left hip   2. Pain in right hip     Plan: I am going to try combination of medications including tramadol, methocarbamol and meloxicam.  I would also like to send her to physical therapy for any type of strengthening and other modalities that can be done for her hips to decrease her hip pain.  Any modalities will be per the therapist discretion for trochanteric bursitis and IT band syndrome.  All questions and concerns were answered and addressed.  I would like to see her back in 4 weeks to see how she is doing overall.  Follow-Up Instructions: Return in about 4 weeks (around 09/09/2020).   Orders:  Orders Placed This Encounter  Procedures  . XR HIPS BILAT W OR W/O PELVIS 2V   Meds ordered this encounter  Medications  . methocarbamol (ROBAXIN) 750 MG tablet    Sig: Take 1 tablet (750 mg total) by mouth every 8 (eight) hours as needed for muscle spasms.    Dispense:  60 tablet    Refill:  1  . meloxicam (MOBIC) 7.5 MG tablet    Sig: Take 1 tablet (7.5 mg total) by mouth 2 (two) times daily between meals as needed for pain.    Dispense:  60 tablet    Refill:  1  . traMADol (ULTRAM) 50 MG tablet    Sig: Take 2 tablets (100 mg total) by mouth every 6 (six) hours as needed.    Dispense:  30 tablet    Refill:  0      Procedures: No procedures performed   Clinical Data: No additional findings.   Subjective: Chief Complaint  Patient presents with  . Right Leg - Pain  . Left Leg - Follow-up  The patient is about 6-1/2 years out from a right total hip arthroplasty.  She comes in today with a history of feeling like her right hip is going to give out.  She denies any  dislocation but she said she fell in July and fell landing and I told her present was okay when she had x-rays.  She points to the lateral aspect of both her hips on the right and left side as a source of her pain.  She denies any groin pain.  She has never had any type of dislocation either on this right hip that was done through an anterior approach.  She walks without an assistive device and does not seem to have any significant limp.  She is a diabetic.  She does state that some of the pain wakes her up at night.  HPI  Review of Systems She currently denies any headache, chest pain, shortness of breath, fever, chills, nausea, vomiting  Objective: Vital Signs: Ht 5\' 2"  (1.575 m)   Wt 188 lb (85.3 kg)   BMI 34.39 kg/m   Physical Exam She is alert and orient x3 and in no acute distress Ortho Exam Examination of both hips show the move smoothly and fluidly with no pain in the groin at all  and no instability on my exam of the right hip.  Her pain seems to be over the trochanteric area and the IT band bilaterally that is equal.  Her leg lengths appear equal.  The remainder of her lower extremity exam on both sides is normal. Specialty Comments:  No specialty comments available.  Imaging: XR HIPS BILAT W OR W/O PELVIS 2V  Result Date: 08/12/2020 An AP pelvis and lateral both hips a well-seated total hip arthroplasty on the right side with no complicating features.  There is no acute findings on the left side with a well-maintained hip joint.    PMFS History: Patient Active Problem List   Diagnosis Date Noted  . Anemia 10/13/2019  . Glaucoma 10/13/2019  . Hypertensive disorder 10/13/2019  . Uterine leiomyoma 10/13/2019  . Vitamin D insufficiency 04/26/2019  . TIA (transient ischemic attack) 03/30/2019  . Hyperlipidemia 03/30/2019  . Thyroid nodule 03/30/2019  . Stroke-like episode s/p IV tPA 03/28/2019  . GERD (gastroesophageal reflux disease) 06/19/2018  . Constipation 06/19/2018   . Other fatigue 08/02/2017  . Shortness of breath on exertion 08/02/2017  . Mass of axilla 07/07/2017  . Abdominal pain, epigastric   . Abnormal CT of the abdomen   . Pancreatitis, acute   . Acute pancreatitis 08/13/2015  . UTI (lower urinary tract infection) 08/13/2015  . Severe obesity (BMI >= 40) ([place]) 01/23/2015  . Sinusitis, chronic 01/08/2015  . Cough variant asthma 01/03/2015  . Arthritis of right hip 12/12/2013  . 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 Benign neoplasm of colon 08/22/2013  . Special screening for malignant neoplasms, colon 08/22/2013  . Abdominal pain 08/19/2013  . Diabetes mellitus without complication ([place]) 82/99/3716  . Essential hypertension, benign 08/19/2013  . Ventral hernia 08/19/2013   Past Medical History:  Diagnosis Date  . Anemia 2006   required transfusion post TAH/BSO 05/2005  . Arthritis   . Asthma   . Chronic cough   . Colon polyps    hyperplastic  . Diabetes mellitus without complication ([place])   . Diverticulosis   . GERD (gastroesophageal reflux disease)    on prilosec  . GLA deficiency ([place])   . Glaucoma of both eyes   . Hypertension   . Joint pain   . Osteoarthritis   . Pancreatitis   . Shortness of breath dyspnea   . Vitamin D deficiency     Family History  Problem Relation Age of Onset  . Emphysema Mother        smoked  . Diabetes Mother   . Hypertension Mother   . Colon cancer Father        7-s  . Hypertension Father   . Heart disease Father   . High Cholesterol Father   . Breast cancer Sister     Past Surgical History:  Procedure Laterality Date  . ABDOMINAL HYSTERECTOMY    . BREAST EXCISIONAL BIOPSY Right 1990  . COLONOSCOPY N/A 08/22/2013   Procedure: COLONOSCOPY;  Surgeon: Irene Shipper, MD;  Location: [facility] ENDOSCOPY;  Service: Endoscopy;  Laterality: N/A;  . ESOPHAGOGASTRODUODENOSCOPY N/A 08/22/2013   Procedure: ESOPHAGOGASTRODUODENOSCOPY (EGD);  Surgeon: Irene Shipper, MD;  Location[facility]  ENDOSCOPY;  Service: Endoscopy;  Laterality: N/A;  . ESOPHAGOGASTRODUODENOSCOPY (EGD) WITH PROPOFOL N/A 08/19/2015   Procedure: ESOPHAGOGASTRODUODENOSCOPY (EGD) WITH PROPOFOL;  Surgeon: Irene Shipper, MD;  Location: [facility] ENDOSCOPY;  Service: Endoscopy;  Laterality: N/A;  . HERNIA REPAIR    . HYSTEROSCOPY WITH D & C  01/2005  for uterine fibroids.   . INSERTION OF MESH N/A 08/24/2013   Procedure: INSERTION OF MESH;  Surgeon: Edward Jolly, MD;  Location: [place];  Service: General;  Laterality: N/A;  . JOINT REPLACEMENT Right   . LIPOMA EXCISION Left 06/21/2015   Procedure: EXCISION OF LEFT SCALP LIPOMA;  Surgeon: Johnathan Hausen, MD;  Location: [place];  Service: General;  Laterality: Left;  . PANNICULECTOMY N/A 08/24/2013   Procedure: PANNICULECTOMY;  Surgeon: Edward Jolly, MD;  Location: [place];  Service: General;  Laterality: N/A;  . TOTAL ABDOMINAL HYSTERECTOMY W/ BILATERAL SALPINGOOPHORECTOMY  05/2005  . TOTAL HIP ARTHROPLASTY Right 12/12/2013   Procedure: RIGHT TOTAL HIP ARTHROPLASTY ANTERIOR APPROACH;  Surgeon: Mcarthur Rossetti, MD;  Location: [place];  Service: Orthopedics;  Laterality: Right;  . VENTRAL HERNIA REPAIR N/A 08/24/2013   Procedure: HERNIA REPAIR VENTRAL ADULT;  Surgeon: Edward Jolly, MD;  Location: [facility] OR;  Service: General;  Laterality: N/A;   Social History   Occupational History  . Occupation: Education officer, museum  Tobacco Use  . Smoking status: Former Smoker    Packs/day: 0.25    Years: 15.00    Pack years: 3.75    Types: Cigarettes    Quit date: 06/22/1978    Years since quitting: 42.1  . Smokeless tobacco: Never Used  Vaping Use  . Vaping Use: Never used  Substance and Sexual Activity  . Alcohol use: No    Alcohol/week: 0.0 standard drinks  . Drug use: No  . Sexual activity: Not on file

## 2020-08-26 ENCOUNTER — Ambulatory Visit: Payer: BC Managed Care – PPO | Admitting: Physical Therapy

## 2020-08-31 ENCOUNTER — Ambulatory Visit (HOSPITAL_COMMUNITY)
Admission: EM | Admit: 2020-08-31 | Discharge: 2020-08-31 | Disposition: A | Discharge status: Home / Self Care | Payer: BC Managed Care – PPO | Attending: Student | Admitting: Student

## 2020-08-31 ENCOUNTER — Other Ambulatory Visit: Payer: Self-pay

## 2020-08-31 ENCOUNTER — Encounter (HOSPITAL_COMMUNITY): Payer: Self-pay

## 2020-08-31 DIAGNOSIS — Z87891 Personal history of nicotine dependence: Secondary | ICD-10-CM

## 2020-08-31 DIAGNOSIS — J4541 Moderate persistent asthma with (acute) exacerbation: Secondary | ICD-10-CM | POA: Diagnosis present

## 2020-08-31 DIAGNOSIS — Z1152 Encounter for screening for COVID-19: Secondary | ICD-10-CM | POA: Diagnosis not present

## 2020-08-31 DIAGNOSIS — J209 Acute bronchitis, unspecified: Secondary | ICD-10-CM

## 2020-08-31 DIAGNOSIS — J301 Allergic rhinitis due to pollen: Secondary | ICD-10-CM | POA: Diagnosis present

## 2020-08-31 DIAGNOSIS — E119 Type 2 diabetes mellitus without complications: Secondary | ICD-10-CM | POA: Diagnosis not present

## 2020-08-31 LAB — SARS CORONAVIRUS 2 (TAT 6-24 HRS): SARS Coronavirus 2: POSITIVE — AB

## 2020-08-31 MED ORDER — PREDNISONE 20 MG PO TABS: 20.0000 mg | ORAL_TABLET | Freq: Every day | ORAL | 0 refills | Status: AC

## 2020-08-31 MED ORDER — METHYLPREDNISOLONE SODIUM SUCC 125 MG IJ SOLR
60.0000 mg | Freq: Once | INTRAMUSCULAR | Status: AC
  Administered 2020-08-31: 60 mg via INTRAMUSCULAR

## 2020-08-31 MED ORDER — CETIRIZINE HCL 10 MG PO TABS: 10.0000 mg | ORAL_TABLET | Freq: Every day | ORAL | 2 refills | Status: AC

## 2020-08-31 MED ORDER — PROMETHAZINE-DM 6.25-15 MG/5ML PO SYRP: 5.0000 mL | ORAL_SOLUTION | Freq: Four times a day (QID) | ORAL | 0 refills | Status: DC | PRN

## 2020-08-31 MED ORDER — METHYLPREDNISOLONE SODIUM SUCC 125 MG IJ SOLR
INTRAMUSCULAR | Status: AC
  Filled 2020-08-31: qty 2

## 2020-08-31 MED ORDER — FLUTICASONE PROPIONATE 50 MCG/ACT NA SUSP: 1.0000 | Freq: Every day | NASAL | 2 refills | Status: AC

## 2020-08-31 NOTE — ED Provider Notes (Signed)
[facility]    CSN: [id number] Arrival date & time: 08/31/20  1002      History   Chief Complaint Chief Complaint  Patient presents with  . Headache  . Cough  . Wheezing    HPI [name] is a 65 y.o. female presenting with headaches, cough, and wheezing for 4 days. History asthma, former smoker, chronic cough, cough variant asthma, shortness of breath, diabetes, diverticulosis, GERD, glaucoma, hypertension, OA, pancreatitis, hyperlipidemia. Taking albuterol and symbicort for asthma with some improvement.  States she is out of Zyrtec and Flonase for her allergic rhinitis.  Requesting Covid test. Denies fevers/chills, n/v/d, shortness of breath, chest pain,  facial pain, teeth pain, sore throat, loss of taste/smell, swollen lymph nodes, ear pain, worse headache of life.    HPI  Past Medical History:  Diagnosis Date  . Anemia 2006   required transfusion post TAH/BSO 05/2005  . Arthritis   . Asthma   . Chronic cough   . Colon polyps    hyperplastic  . Diabetes mellitus without complication ([place])   . Diverticulosis   . GERD (gastroesophageal reflux disease)    on prilosec  . GLA deficiency ([place])   . Glaucoma of both eyes   . Hypertension   . Joint pain   . Osteoarthritis   . Pancreatitis   . Shortness of breath dyspnea   . Vitamin D deficiency     Patient Active Problem List   Diagnosis Date Noted  . Anemia 10/13/2019  . Glaucoma 10/13/2019  . Hypertensive disorder 10/13/2019  . Uterine leiomyoma 10/13/2019  . Vitamin D insufficiency 04/26/2019  . TIA (transient ischemic attack) 03/30/2019  . Hyperlipidemia 03/30/2019  . Thyroid nodule 03/30/2019  . Stroke-like episode s/p IV tPA 03/28/2019  . GERD (gastroesophageal reflux disease) 06/19/2018  . Constipation 06/19/2018  . Other fatigue 08/02/2017  . Shortness of breath on exertion 08/02/2017  . Mass of axilla 07/07/2017  . Abdominal pain, epigastric   . Abnormal CT of the abdomen   .  Pancreatitis, acute   . Acute pancreatitis 08/13/2015  . UTI (lower urinary tract infection) 08/13/2015  . Severe obesity (BMI >= 40) ([place]) 01/23/2015  . Sinusitis, chronic 01/08/2015  . Cough variant asthma 01/03/2015  . Arthritis of right hip 12/12/2013  . 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 Benign neoplasm of colon 08/22/2013  . Special screening for malignant neoplasms, colon 08/22/2013  . Abdominal pain 08/19/2013  . Diabetes mellitus without complication ([place]) 88/50/2774  . Essential hypertension, benign 08/19/2013  . Ventral hernia 08/19/2013    Past Surgical History:  Procedure Laterality Date  . ABDOMINAL HYSTERECTOMY    . BREAST EXCISIONAL BIOPSY Right 1990  . COLONOSCOPY N/A 08/22/2013   Procedure: COLONOSCOPY;  Surgeon: Irene Shipper, MD;  Location[facility] ENDOSCOPY;  Service: Endoscopy;  Laterality: N/A;  . ESOPHAGOGASTRODUODENOSCOPY N/A 08/22/2013   Procedure: ESOPHAGOGASTRODUODENOSCOPY (EGD);  Surgeon: Irene Shipper, MD;  Location: [facility] ENDOSCOPY;  Service: Endoscopy;  Laterality: N/A;  . ESOPHAGOGASTRODUODENOSCOPY (EGD) WITH PROPOFOL N/A 08/19/2015   Procedure: ESOPHAGOGASTRODUODENOSCOPY (EGD) WITH PROPOFOL;  Surgeon: Irene Shipper, MD;  Location: [facility] ENDOSCOPY;  Service: Endoscopy;  Laterality: N/A;  . HERNIA REPAIR    . HYSTEROSCOPY WITH D & C  01/2005   for uterine fibroids.   . INSERTION OF MESH N/A 08/24/2013   Procedure: INSERTION OF MESH;  Surgeon: Edward Jolly, MD;  Location: [place];  Service: General;  Laterality: N/A;  . JOINT  REPLACEMENT Right   . LIPOMA EXCISION Left 06/21/2015   Procedure: EXCISION OF LEFT SCALP LIPOMA;  Surgeon: Johnathan Hausen, MD;  Location: [place];  Service: General;  Laterality: Left;  . PANNICULECTOMY N/A 08/24/2013   Procedure: PANNICULECTOMY;  Surgeon: Edward Jolly, MD;  Location: [place];  Service: General;  Laterality: N/A;  . TOTAL ABDOMINAL HYSTERECTOMY W/ BILATERAL SALPINGOOPHORECTOMY  05/2005   . TOTAL HIP ARTHROPLASTY Right 12/12/2013   Procedure: RIGHT TOTAL HIP ARTHROPLASTY ANTERIOR APPROACH;  Surgeon: Mcarthur Rossetti, MD;  Location: [place];  Service: Orthopedics;  Laterality: Right;  . VENTRAL HERNIA REPAIR N/A 08/24/2013   Procedure: HERNIA REPAIR VENTRAL ADULT;  Surgeon: Edward Jolly, MD;  Location: [facility] OR;  Service: General;  Laterality: N/A;    OB History    Gravida  5   Para      Term      Preterm      AB      Living  5     SAB      IAB      Ectopic      Multiple      Live Births               Home Medications    Prior to Admission medications   Medication Sig Start Date End Date Taking? Authorizing Provider  cetirizine (ZYRTEC ALLERGY) 10 MG tablet Take 1 tablet (10 mg total) by mouth daily. 08/31/20  Yes Hazel Sams, PA-C  fluticasone (FLONASE) 50 MCG/ACT nasal spray Place 1 spray into both nostrils daily. 08/31/20  Yes Hazel Sams, PA-C  predniSONE (DELTASONE) 20 MG tablet Take 1 tablet (20 mg total) by mouth daily for 5 days. 08/31/20 09/05/20 Yes Hazel Sams, PA-C  promethazine-dextromethorphan (PROMETHAZINE-DM) 6.25-15 MG/5ML syrup Take 5 mLs by mouth 4 (four) times daily as needed for cough. 08/31/20  Yes Hazel Sams, PA-C  albuterol (PROVENTIL) (2.5 MG/3ML) 0.083% nebulizer solution Take 3 mLs (2.5 mg total) by nebulization every 6 (six) hours as needed for wheezing or shortness of breath. 06/29/19   Jaynee Eagles, PA-C  aspirin EC 81 MG EC tablet Take 1 tablet (81 mg total) by mouth daily. 03/31/19   Donzetta Starch, NP  atorvastatin (LIPITOR) 20 MG tablet Take 1 tablet (20 mg total) by mouth daily at 6 PM. 03/30/19   Donzetta Starch, NP  budesonide-formoterol (SYMBICORT) 160-4.5 MCG/ACT inhaler Inhale 2 puffs into the lungs in the morning and at bedtime. 04/17/20   Hunsucker, Bonna Gains, MD  dextromethorphan-guaiFENesin (MUCINEX DM) 30-600 MG 12hr tablet Take 1 tablet by mouth 2 (two) times daily. 05/03/20   Wieters, Hallie C,  PA-C  glucose blood test strip Use as instructed 08/25/17   Dennard Nip D, MD  influenza vac split quadrivalent PF (AFLURIA QUADRIVALENT) 0.5 ML injection Afluria Qd 2020-21 (36 mos up)(PF)60 mcg (15 mcg x4)/0.5 mL IM syringe  PHARMACY ADMINISTERED    [provider]  MELATONIN PO Take 1 tablet by mouth at bedtime as needed (sleep).     [provider]  meloxicam (MOBIC) 7.5 MG tablet Take 1 tablet (7.5 mg total) by mouth 2 (two) times daily between meals as needed for pain. 08/12/20   Mcarthur Rossetti, MD  metFORMIN (GLUCOPHAGE) 1000 MG tablet Take 1,000 mg by mouth 2 (two) times daily with a meal.    [provider]  methocarbamol (ROBAXIN) 750 MG tablet Take 1 tablet (750 mg total) by mouth  every 8 (eight) hours as needed for muscle spasms. 08/12/20   Mcarthur Rossetti, MD  montelukast (SINGULAIR) 10 MG tablet Take 1 tablet (10 mg total) by mouth at bedtime. 04/17/20   Hunsucker, Bonna Gains, MD  omeprazole (PRILOSEC) 20 MG capsule omeprazole 20 mg capsule,delayed release    [provider]  pioglitazone (ACTOS) 15 MG tablet Take 15 mg by mouth daily. 09/06/19   [provider]  traMADol (ULTRAM) 50 MG tablet Take 2 tablets (100 mg total) by mouth every 6 (six) hours as needed. 08/12/20   Mcarthur Rossetti, MD  Travoprost, BAK Free, (TRAVATAN) 0.004 % SOLN ophthalmic solution  08/11/19   [provider]    Family History Family History  Problem Relation Age of Onset  . Emphysema Mother        smoked  . Diabetes Mother   . Hypertension Mother   . Colon cancer Father        7-s  . Hypertension Father   . Heart disease Father   . High Cholesterol Father   . Breast cancer Sister     Social History Social History   Tobacco Use  . Smoking status: Former Smoker    Packs/day: 0.25    Years: 15.00    Pack years: 3.75    Types: Cigarettes    Quit date: 06/22/1978    Years since quitting: 42.2  . Smokeless tobacco:  Never Used  Vaping Use  . Vaping Use: Never used  Substance Use Topics  . Alcohol use: No    Alcohol/week: 0.0 standard drinks  . Drug use: No     Allergies   Naproxen, Diclofenac, and Penicillins   Review of Systems Review of Systems  Constitutional: Negative for appetite change, chills, diaphoresis, fever and unexpected weight change.  HENT: Positive for congestion. Negative for ear pain, sinus pressure, sinus pain, sneezing, sore throat and trouble swallowing.   Respiratory: Positive for cough and wheezing. Negative for chest tightness and shortness of breath.   Cardiovascular: Negative for chest pain.  Gastrointestinal: Negative for abdominal distention, abdominal pain, anal bleeding, blood in stool, constipation, diarrhea, nausea, rectal pain and vomiting.  Genitourinary: Negative for dysuria, flank pain, frequency and urgency.  Musculoskeletal: Negative for back pain and myalgias.  Neurological: Positive for headaches. Negative for dizziness and light-headedness.  All other systems reviewed and are negative.    Physical Exam Triage Vital Signs ED Triage Vitals [08/31/20 1012]  Enc Vitals Group     BP      Pulse      Resp      Temp      Temp src      SpO2      Weight      Height      Head Circumference      Peak Flow      Pain Score 6     Pain Loc      Pain Edu?      Excl. in [place]?    No data found.  Updated Vital Signs BP (!) 150/68 (BP Location: Right Arm)   Pulse 65   Temp 97.6 F (36.4 C) (Oral)   Resp 18   SpO2 99%   Visual Acuity Right Eye Distance:   Left Eye Distance:   Bilateral Distance:    Right Eye Near:   Left Eye Near:    Bilateral Near:     Physical Exam Vitals reviewed.  Constitutional:      General:  She is not in acute distress.    Appearance: Normal appearance. She is not ill-appearing.  HENT:     Head: Normocephalic and atraumatic.     Right Ear: Hearing, tympanic membrane, ear canal and external ear normal. No swelling or  tenderness. There is no impacted cerumen. No mastoid tenderness. Tympanic membrane is not perforated, erythematous, retracted or bulging.     Left Ear: Hearing, tympanic membrane, ear canal and external ear normal. No swelling or tenderness. There is no impacted cerumen. No mastoid tenderness. Tympanic membrane is not perforated, erythematous, retracted or bulging.     Nose:     Right Sinus: No maxillary sinus tenderness or frontal sinus tenderness.     Left Sinus: No maxillary sinus tenderness or frontal sinus tenderness.     Mouth/Throat:     Mouth: Mucous membranes are moist.     Pharynx: Uvula midline. No oropharyngeal exudate or posterior oropharyngeal erythema.     Tonsils: No tonsillar exudate.  Cardiovascular:     Rate and Rhythm: Normal rate and regular rhythm.     Heart sounds: Normal heart sounds.  Pulmonary:     Effort: No accessory muscle usage or prolonged expiration.     Breath sounds: Normal breath sounds and air entry. No decreased breath sounds, wheezing, rhonchi or rales.  Chest:     Chest wall: No tenderness.  Abdominal:     General: Abdomen is flat. Bowel sounds are normal.     Tenderness: There is no abdominal tenderness. There is no guarding or rebound.  Lymphadenopathy:     Cervical: No cervical adenopathy.  Neurological:     General: No focal deficit present.     Mental Status: She is alert and oriented to person, place, and time.  Psychiatric:        Attention and Perception: Attention and perception normal.        Mood and Affect: Mood and affect normal.        Behavior: Behavior normal. Behavior is cooperative.        Thought Content: Thought content normal.        Judgment: Judgment normal.      UC Treatments / Results  Labs (all labs ordered are listed, but only abnormal results are displayed) Labs Reviewed  SARS CORONAVIRUS 2 (TAT 6-24 HRS)    EKG   Radiology No results found.  Procedures Procedures (including critical care  time)  Medications Ordered in UC Medications  methylPREDNISolone sodium succinate (SOLU-MEDROL) 125 mg/2 mL injection 60 mg (has no administration in time range)    Initial Impression / Assessment and Plan / UC Course  I have reviewed the triage vital signs and the nursing notes.  Pertinent labs & imaging results that were available during my care of the patient were reviewed by me and considered in my medical decision making (see chart for details).      This patient is a 65 year old female presenting with acute bronchitis and allergic rhinitis. Today she is  afebrile nontachycardic nontachypneic, oxygenating well on room air, no wheezes rhonchi's or rales.   For asthma, continue current regimen- albuterol, symbicort.   For diabetes- fasting sugars running 120s at home. Continue to monitor these. Continue current regimen.   For bronchitis, solumedrol and prednisone as below. Promethazine DM for symptomatic relief.  For allergic rhinitis, zyrtec and flonase refilled.  Covid PCR sent.  Isolation as per CDC guidelines.  Return precautions discussed.  This chart was dictated using voice recognition software, Dragon.  Despite the best efforts of this provider to proofread and correct errors, errors may still occur which can change documentation meaning.   Final Clinical Impressions(s) / UC Diagnoses   Final diagnoses:  Acute bronchitis, unspecified organism  Moderate persistent asthma with acute exacerbation  Type 2 diabetes mellitus without complication, without long-term current use of insulin ([place])  Encounter for screening for COVID-19  Seasonal allergic rhinitis due to pollen  Former smoker     Discharge Instructions     -Start the prednisone- one pill daily for 5 days in a row, starting tomorrow.  -Promethazine DM cough syrup for congestion/cough. This could make you drowsy, so take at night before bed. -Zyrtec daily and flonase 1-2x daily. I refilled this  today. -Continue the albuterol and Symbicort inhalers. -It is important that you continue to monitor your sugars at home while you are taking the steroid.  Follow-up with your primary care if these raise more than 20 points. -Continue current regimen of diabetes medications. -Seek immediate medical attention if you develop shortness of breath, chest pain, fever/chills are not reduced by Tylenol, worse headache of life, dizziness, etc.    ED Prescriptions    Medication Sig Dispense Auth. Provider   predniSONE (DELTASONE) 20 MG tablet Take 1 tablet (20 mg total) by mouth daily for 5 days. 5 tablet Hazel Sams, PA-C   promethazine-dextromethorphan (PROMETHAZINE-DM) 6.25-15 MG/5ML syrup Take 5 mLs by mouth 4 (four) times daily as needed for cough. 118 mL Hazel Sams, PA-C   cetirizine (ZYRTEC ALLERGY) 10 MG tablet Take 1 tablet (10 mg total) by mouth daily. 30 tablet Hazel Sams, PA-C   fluticasone [facility]) 50 MCG/ACT nasal spray Place 1 spray into both nostrils daily. 15 mL Hazel Sams, PA-C     PDMP not reviewed this encounter.   Hazel Sams, PA-C 08/31/20 1046

## 2020-08-31 NOTE — Discharge Instructions (Addendum)
-  Start the prednisone- one pill daily for 5 days in a row, starting tomorrow.  -Promethazine DM cough syrup for congestion/cough. This could make you drowsy, so take at night before bed. -Zyrtec daily and flonase 1-2x daily. I refilled this today. -Continue the albuterol and Symbicort inhalers. -It is important that you continue to monitor your sugars at home while you are taking the steroid.  Follow-up with your primary care if these raise more than 20 points. -Continue current regimen of diabetes medications. -Seek immediate medical attention if you develop shortness of breath, chest pain, fever/chills are not reduced by Tylenol, worse headache of life, dizziness, etc.

## 2020-08-31 NOTE — ED Triage Notes (Signed)
Pt c/o cough and wheezing x 3 days. Pt states she has a headache X 4 days. Pt states she has not been tested for COVID. She states she has been giving herself breathing treatments.

## 2020-09-02 ENCOUNTER — Other Ambulatory Visit (HOSPITAL_COMMUNITY): Payer: Self-pay | Admitting: Family Medicine

## 2020-09-02 ENCOUNTER — Telehealth: Payer: Self-pay

## 2020-09-02 NOTE — Telephone Encounter (Signed)
Called to discuss with patient about COVID-19 symptoms and the use of one of the available treatments for those with mild to moderate Covid symptoms and at a high risk of hospitalization.  Pt appears to qualify for outpatient treatment due to co-morbid conditions and/or a member of an at-risk group in accordance with the FDA Emergency Use Authorization.    Symptom onset: 2 weeks ago 08/23/20 Cough,headache Vaccinated: Yes Booster? No Immunocompromised? No Qualifiers: Asthma,DM,HTN  Unable to reach pt - Pt. Out of treatment window.   Marcello Moores

## 2020-09-04 ENCOUNTER — Other Ambulatory Visit: Payer: Self-pay | Admitting: Obstetrics and Gynecology

## 2020-09-04 ENCOUNTER — Ambulatory Visit: Payer: BC Managed Care – PPO | Admitting: Rehabilitative and Restorative Service Providers"

## 2020-09-04 DIAGNOSIS — N6310 Unspecified lump in the right breast, unspecified quadrant: Secondary | ICD-10-CM

## 2020-09-09 ENCOUNTER — Ambulatory Visit: Payer: Self-pay | Admitting: Orthopaedic Surgery

## 2020-09-17 ENCOUNTER — Institutional Professional Consult (permissible substitution): Payer: BC Managed Care – PPO | Admitting: Internal Medicine

## 2020-09-17 ENCOUNTER — Ambulatory Visit: Payer: BC Managed Care – PPO | Admitting: Adult Health

## 2020-09-18 ENCOUNTER — Encounter: Payer: Self-pay | Admitting: Primary Care

## 2020-09-18 ENCOUNTER — Ambulatory Visit: Payer: BC Managed Care – PPO | Admitting: Primary Care

## 2020-09-18 ENCOUNTER — Ambulatory Visit (INDEPENDENT_AMBULATORY_CARE_PROVIDER_SITE_OTHER): Payer: BC Managed Care – PPO

## 2020-09-18 ENCOUNTER — Other Ambulatory Visit: Payer: Self-pay

## 2020-09-18 VITALS — BP 118/78 | HR 78 | Temp 98.3°F | Ht 62.0 in | Wt 193.0 lb

## 2020-09-18 DIAGNOSIS — J4521 Mild intermittent asthma with (acute) exacerbation: Secondary | ICD-10-CM | POA: Diagnosis not present

## 2020-09-18 DIAGNOSIS — U071 COVID-19: Secondary | ICD-10-CM | POA: Diagnosis not present

## 2020-09-18 DIAGNOSIS — J4530 Mild persistent asthma, uncomplicated: Secondary | ICD-10-CM | POA: Diagnosis not present

## 2020-09-18 MED ORDER — CHLORPHENIRAMINE MALEATE 4 MG PO TABS: 4.0000 mg | ORAL_TABLET | Freq: Four times a day (QID) | ORAL | 0 refills | Status: AC | PRN

## 2020-09-18 MED ORDER — METHYLPREDNISOLONE ACETATE 80 MG/ML IJ SUSP
80.0000 mg | Freq: Once | INTRAMUSCULAR | Status: AC
  Administered 2020-09-18: 80 mg via INTRAMUSCULAR

## 2020-09-18 MED ORDER — PREDNISONE 10 MG PO TABS: ORAL_TABLET | ORAL | 0 refills | Status: DC

## 2020-09-18 NOTE — Progress Notes (Signed)
@Patient  ID: [name], female    DOB: [birth year], 65 y.o.   MRN: [id number]  Chief Complaint  Patient presents with  . Follow-up    Productive cough with white mucus, wheezing, sob. Symptoms started a month ago.     Referring provider: Antony Contras, MD  HPI: 65 year old female, former smoker. PMH significant for HTN, TIA, cough variant asthma, chronic sinusitis. Patient of [name], last seen on 04/17/20.  Previous [facility] pulmonary encounter: 04/17/20 65 year old woman who previously followed in pulmonary clinic with [name] with asthma whom we are seen in consultation at request of Renee File, MD for evaluation of cough, shortness of breath.  Prior pulmonary notes and recent ED notes reviewed.  Patient has been diagnosed with asthma for years.  Usually relatively well controlled.  Uses Symbicort mid dose.  About once or twice a years with changes states since, spring and fall she has worsening breathing.  This occurred earlier in the month.  All of a sudden she had significant chest tightness, wheeze, shortness of breath.  Associated with hacking cough.  Dry.  Went to urgent care and received a dose of IV steroids.  Her symptoms have improved a good deal but still has lingering cough and mild chest tightness, shortness of breath.  No fever or chills.  No sick contacts.  Denies any viral symptoms such as sore throat, runny nose, fever at time of onset of symptoms.  When asked, she does think maybe she had a little more nasal congestion on the days preceding this acute change.  Chest x-ray 03/04/2020 reviewed and interpreted as clear lungs with mild hyperinflation with increased AP diameter on lateral film.  Prior labs reviewed which are notable for elevated IgE greater than 1200 and eosinophils as high as 800 in the past.  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 Hx asthma. She was seen on 08/31/20 at [facility] for acute bronchitis. She was given solumedrol, prednisone and promethazine DM for cough. Covid PCR was positive.  Continues to have a moderate cough with associated shortness of breath and wheezing. Maintained on Symbicort 160 as prescribed. She is using albuterol nebulizer twice a day. She takes Zyrtec and Flonase daily. She has continued to work from home despite being covid positive. She is a Education officer, museum. 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 Denies f/c/s, chest tightness, chest pain, N/V/D.   Allergies  Allergen Reactions  . Naproxen     "hives, I had to call 911"   . Diclofenac     Hives   . Penicillins Hives    Has patient had a PCN reaction causing immediate rash, facial/tongue/throat swelling, SOB or lightheadedness with hypotension: Yes Has patient had a PCN reaction causing severe rash involving mucus membranes or skin necrosis: Yes Has patient had a PCN reaction that required hospitalization No Has patient had a PCN reaction occurring within the last 10 years: No. If all of the above answers are "NO", then may proceed with Cephalosporin use.     Immunization History  Administered Date(s) Administered  . Influenza Split 03/22/2014  . Influenza,inj,quad, With Preservative 04/23/2019, 03/22/2020  . PFIZER(Purple Top)SARS-COV-2 Vaccination 08/19/2019, 09/09/2019    Past Medical History:  Diagnosis Date  . Anemia 2006   required transfusion post TAH/BSO 05/2005  . Arthritis   . Asthma   . Chronic cough   . Colon polyps    hyperplastic  . Diabetes mellitus without complication ([place])   . Diverticulosis   . GERD (gastroesophageal reflux disease)    on prilosec  . GLA deficiency ([place])    . Glaucoma of both eyes   . Hypertension   . Joint pain   . Osteoarthritis   . Pancreatitis   . Shortness of breath dyspnea   . Vitamin D deficiency     Tobacco History: Social History   Tobacco Use  Smoking Status Former Smoker  . Packs/day: 0.25  . Years: 15.00  . Pack years: 3.75  . Types: Cigarettes  . Quit date: 06/22/1978  . Years since quitting: 42.2  Smokeless Tobacco Never Used   Counseling given: Not Answered   Outpatient Medications Prior to Visit  Medication Sig Dispense Refill  . albuterol (PROVENTIL) (2.5 MG/3ML) 0.083% nebulizer solution Take 3 mLs (2.5 mg total) by nebulization every 6 (six) hours as needed for wheezing or shortness of breath. 75 mL 0  . aspirin EC 81 MG EC tablet Take 1 tablet (81 mg total) by mouth daily.    Marland Kitchen atorvastatin (LIPITOR) 20 MG tablet Take 1 tablet (20 mg total) by mouth daily at 6 PM. 30 tablet 2  . budesonide-formoterol (SYMBICORT) 160-4.5 MCG/ACT inhaler Inhale 2 puffs into the lungs in the morning and at bedtime. 1 each 12  . cetirizine (ZYRTEC ALLERGY) 10 MG tablet Take 1 tablet (10 mg total) by mouth daily. 30 tablet 2  . fluticasone (FLONASE) 50 MCG/ACT nasal spray Place 1 spray into both nostrils daily. 15 mL 2  . glucose blood test strip Use as instructed 100 each 1  . influenza vac split quadrivalent PF (AFLURIA QUADRIVALENT) 0.5 ML injection Afluria Qd 2020-21 (36 mos up)(PF)60 mcg (15 mcg x4)/0.5 mL IM syringe  PHARMACY ADMINISTERED    . MELATONIN PO Take 1 tablet by mouth at bedtime as needed (sleep).     . meloxicam (MOBIC) 7.5 MG tablet Take 1 tablet (7.5 mg total) by mouth 2 (two) times daily between meals as needed for pain. 60 tablet 1  . metFORMIN (GLUCOPHAGE) 1000 MG tablet Take 1,000 mg by mouth 2 (two) times daily with a meal.    . methocarbamol (ROBAXIN) 750 MG tablet Take 1 tablet (750 mg total) by mouth every 8 (eight) hours as needed for muscle spasms. 60 tablet 1  . montelukast (SINGULAIR) 10 MG tablet  Take 1 tablet (10 mg total) by mouth at bedtime. 30 tablet 11  . omeprazole (PRILOSEC) 20 MG capsule omeprazole 20 mg capsule,delayed release    . pioglitazone (ACTOS) 15 MG tablet Take 15 mg by mouth daily.    . promethazine-dextromethorphan (PROMETHAZINE-DM) 6.25-15 MG/5ML syrup Take 5 mLs by mouth 4 (four) times daily as needed for cough. 118 mL 0  . traMADol (ULTRAM) 50 MG tablet Take 2 tablets (100 mg total) by mouth every 6 (six) hours as needed. 30 tablet 0  . Travoprost, BAK Free, (TRAVATAN) 0.004 % SOLN ophthalmic solution     . dextromethorphan-guaiFENesin (MUCINEX DM) 30-600 MG 12hr tablet Take 1 tablet by mouth 2 (two) times daily. 20 tablet 0   No facility-administered medications prior to visit.   Review of Systems  Review of Systems  Constitutional: Positive for fatigue.  Respiratory: Positive for cough, shortness of breath and wheezing.  Physical Exam  BP 118/78 (BP Location: Right Arm, Cuff Size: Normal)   Pulse 78   Temp 98.3 F (36.8 C)   Ht 5\' 2"  (1.575 m)   Wt 193 lb (87.5 kg)   SpO2 99%   BMI 35.30 kg/m  Physical Exam Constitutional:      Appearance: Normal appearance.  HENT:     Head: Normocephalic and atraumatic.     Mouth/Throat:     Comments: Deferred d/t masking  Cardiovascular:     Rate and Rhythm: Normal rate and regular rhythm.  Pulmonary:     Effort: Pulmonary effort is normal.     Breath sounds: Wheezing present.     Comments: Inspiratory wheeze/pleural rub Skin:    General: Skin is warm and dry.  Neurological:     General: No focal deficit present.     Mental Status: She is alert and oriented to person, place, and time. Mental status is at baseline.  Psychiatric:        Mood and Affect: Mood normal.        Behavior: Behavior normal.        Thought Content: Thought content normal.        Judgment: Judgment normal.      Lab Results:  CBC    Component Value Date/Time   WBC 7.4 03/28/2019 1326   RBC 4.68 03/28/2019 1326   HGB  14.6 03/28/2019 1328   HGB 13.4 08/02/2017 1035   HCT 43.0 03/28/2019 1328   HCT 40.7 08/02/2017 1035   PLT 263 03/28/2019 1326   MCV 90.2 03/28/2019 1326   MCV 86 08/02/2017 1035   MCH 30.6 03/28/2019 1326   MCHC 33.9 03/28/2019 1326   RDW 13.2 03/28/2019 1326   RDW 14.9 08/02/2017 1035   LYMPHSABS 3.5 03/28/2019 1326   LYMPHSABS 2.2 08/02/2017 1035   MONOABS 0.6 03/28/2019 1326   EOSABS 0.4 03/28/2019 1326   EOSABS 0.2 08/02/2017 1035   BASOSABS 0.1 03/28/2019 1326   BASOSABS 0.0 08/02/2017 1035    BMET    Component Value Date/Time   NA 139 03/28/2019 1328   NA 141 08/02/2017 1035   K 4.8 03/28/2019 1328   CL 105 03/28/2019 1328   CO2 24 03/28/2019 1326   GLUCOSE 153 (H) 03/28/2019 1328   BUN 18 03/28/2019 1328   BUN 14 08/02/2017 1035   CREATININE 0.90 03/28/2019 1328   CALCIUM 9.5 03/28/2019 1326   GFRNONAA >60 03/28/2019 1326   GFRAA >60 03/28/2019 1326    BNP No results found for: BNP  ProBNP No results found for: PROBNP  Imaging: DG Chest 2 View  Result Date: 09/19/2020 CLINICAL DATA:  65 year old female with COVID pneumonia EXAM: CHEST - 2 VIEW COMPARISON:  05/03/2020 FINDINGS: Cardiomediastinal silhouette unchanged in size and contour. No interlobular septal thickening. No pneumothorax or pleural effusion. No confluent airspace disease. No significant reticulonodular opacities. No displaced fracture. IMPRESSION: Negative for acute cardiopulmonary disease with relatively unchanged appearance of the chest x-ray Electronically Signed   By: Corrie Mckusick D.O.   On: 09/19/2020 09:17     Assessment & Plan:   Asthma exacerbation - Acute asthma exacerbation d/t recent covid dx March 12th. Not hospitalized, outside window for monoclonal anitbody infusion. She has a persistent cough with associated shortness of breath and wheezing. Patient was given 80mg  IM depo-medrol injection in office. We will check CXR today. Needs RX prednisone taper (40mg  x 3 days; 30mg  x 3  days; 20mg  x 3 days; 10mg   x 3 days). Continue Symbicort 160 two puffs twice a day; use albuterol nebulizer 3-4 times a day. Start regular mucinex 600-1200mg  tablet twice a day. She can take chlorpheniramine tablet every 6 hours as needed for cough. Patient to notify PCP if BS >300-350. FU in 6 weeks with [name].    Martyn Ehrich, NP 09/19/2020

## 2020-09-18 NOTE — Patient Instructions (Addendum)
Cough/wheezing/sob likely residual from recent covid infection, it likely exacerbated your underlying asthma. Symptoms can persist for several weeks to months.   Recommendations: - Continue Symbicort 160 two puffs twice a day; use albuterol nebulizer 3-4 times a day  - Start regular mucinex 600-1200mg  tablet twice a day  - Take prednisone taper a directed, monitor blood sugars and if >350 notify PCP  - Take Chlorpheniramine  4mg  tablet every 6 hours as needed for cough/allegies (start at bedtime, may cause drowsiness)  - Continue Zyrtec and flonase as directed   Orders: - CXR today (ordered)  Office treatment: - Depo-medrol 80mg  IM x1 today   Rx: - Prednisone taper - Chlorpheniramine    Follow-up: - 6 weeks with Dr. Silas Flood    Asthma Attack  Asthma attack, also called acute bronchospasm, is the sudden narrowing and tightening of the air passages, which limits the amount of oxygen that can get into the lungs. The narrowing is caused by inflammation and tightening of the muscles in the air tubes (bronchi) in the lungs. Too much mucus is also produced, which narrows the airways more. This can cause trouble breathing, loud breathing (wheezing), and coughing. The goal of treatment is to open the airways in the lungs and reduce inflammation. What are the causes? Possible causes or triggers of this condition include:  Animal dander, dust mites, or cockroaches.  Mold, pollen from trees or grass, or cold air.  Air pollutants such as dust, household cleaners, aerosol sprays, strong chemicals, strong odors, and smoke of any kind.  Stress or strong emotions such as crying or laughing hard.  Exercise or activity that requires a lot of energy.  Substances in foods and drinks, such as dried fruits and wine, called sulfites.  Certain medicines or medical conditions such as: ? Aspirin or beta-blockers. ? Infections or inflammatory conditions, such as a flu (influenza), a cold, pneumonia,  or inflammation of the nasal membranes (rhinitis). ? Gastroesophageal reflux disease (GERD). GERD is a condition in which stomach acid backs up into your esophagus and spills into your trachea (windpipe), which can irritate your airways. What are the signs or symptoms? Symptoms of this condition include:  Wheezing. This may sound like whistling while breathing. This may only happen at night.  Excessive coughing. This may only happen at night.  Chest tightness or pain.  Shortness of breath.  Feeling like you cannot get enough air no matter how hard you breathe (air hunger). How is this diagnosed? This condition may be diagnosed based on:  Your medical history.  Your symptoms.  A physical exam.  Tests to check for other causes of your symptoms or other conditions that may have triggered your asthma attack. These tests may include: ? A chest X-ray. ? Blood tests. ? Tests to assess lung function, such as breathing into a device that measures how much air you can inhale and exhale (spirometry). How is this treated? Treatment for this condition depends on the severity and cause of your asthma attack.  For mild attacks, you may receive medicines through a hand-held inhaler (metered dose inhaler, or MDI) or through a device that turns liquid medicine into a mist (nebulizer). These medicines include: ? Quick relief or rescue medicines that quickly relax the airways and lungs. ? Long-acting medicines that are used daily to prevent (control) your asthma symptoms.  For moderate or severe attacks, you may be treated with steroid medicines by mouth or through an IV injection at the hospital.  For severe attacks,  you may need oxygen therapy or a breathing machine (ventilator).  If your asthma attack was caused by an infection from bacteria, you will be given antibiotic medicines. Follow these instructions at home: Medicines  Take over-the-counter and prescription medicines only as told by  your health care provider. ? Keep your medicines up-to-date. ? Make sure you have all of your medicines available at all times.  If you were prescribed an antibiotic medicine, take it as told by your health care provider. Do not stop taking the antibiotic even if you start to feel better.  Tell your doctor if you may be pregnant to make sure your asthma medicine is safe to use during pregnancy. Avoiding triggers  Keep track of things that trigger your asthma attacks. Avoid exposure to these triggers.  Do not use any products that contain nicotine or tobacco, such as cigarettes, e-cigarettes, and chewing tobacco. If you need help quitting, ask your health care provider.  When there is a lot of pollen, air pollution, or humidity, keep windows closed and use an air conditioner or go to places with air conditioning.   Asthma action plan  Work with your health care provider to make a written plan for managing and treating your asthma attacks (asthma action plan). This plan should include: ? A list of your asthma triggers and how to avoid them. ? A list of symptoms that you may have during an asthma attack. ? Information about which medicine to take, when to take the medicine and how much of the medicine to take. ? Information to help you understand your peak flow measurements. ? Daily actions that you can take to control your asthma symptoms. ? Contact information for your health care providers.  If you have an asthma attack, act quickly. Follow the emergency steps on your written asthma action plan. This may prevent you from needing to go to the hospital. General instructions  Avoid excessive exercise or activity until your asthma attack goes away. Ask your health care provider what activities are safe for you and when you can return to your normal activities.  Stay up to date on all your vaccines, such as flu and pneumonia vaccines.  Drink enough fluid to keep your urine pale yellow.  Staying hydrated helps keep mucus in your lungs thin so it can be coughed up easily.  Do not use alcohol until you have recovered.  Keep all follow-up visits as told by your health care provider. This is important. Asthma requires careful medical care. Contact a health care provider if:  You have followed your action plan for 1 hour and your peak flow reading is still at 50-79%. This is in the yellow zone, which means "caution."  You need to use your quick reliever medicine more frequently than normal.  Your medicines are causing side effects, such as rash, itching, swelling, or trouble breathing.  Your symptoms do not improve after 48 hours.  You cough up mucus that is thicker than usual.  You have a fever. Get help right away if:  Your peak flow reading is less than 50% of your personal best. This is in the red zone, which means "danger."  You have trouble breathing.  You develop chest pain or discomfort.  Your medicines no longer seem to be helping.  You are coughing up bloody mucus.  You have a fever and your symptoms suddenly get worse.  You have trouble swallowing.  You feel very tired, and breathing becomes tiring. These symptoms may  represent a serious problem that is an emergency. Do not wait to see if the symptoms will go away. Get medical help right away. Call your local emergency services (911 in the [place] Do not drive yourself to the hospital. Summary  Asthma attacks are caused by narrowing or tightness in air passages, which causes shortness of breath, coughing, and loud breathing (wheezing).  Many things can trigger an asthma attack, such as allergens, weather changes, exercise, strong odors, and smoke of any kind.  If you have an asthma attack, act quickly. Follow the emergency steps on your written asthma action plan.  Get help right away if you have severe trouble breathing, chest pain, or fever, or if your home medicines are no longer helping with your  symptoms. This information is not intended to replace advice given to you by your health care provider. Make sure you discuss any questions you have with your health care provider. Document Revised: 06/06/2019 Document Reviewed: 06/06/2019 Elsevier Patient Education  2021 [employer].

## 2020-09-19 ENCOUNTER — Encounter: Payer: Self-pay | Admitting: Primary Care

## 2020-09-19 DIAGNOSIS — J45901 Unspecified asthma with (acute) exacerbation: Secondary | ICD-10-CM | POA: Insufficient documentation

## 2020-09-19 NOTE — Assessment & Plan Note (Addendum)
-   Acute asthma exacerbation d/t recent covid dx March 12th. Not hospitalized, outside window for monoclonal anitbody infusion. She has a persistent cough with associated shortness of breath and wheezing. Patient was given 80mg  IM depo-medrol injection in office. CXR today showed no evidence of airspace disease or opacities. Needs RX prednisone taper (40mg  x 3 days; 30mg  x 3 days; 20mg  x 3 days; 10mg  x 3 days). Continue Symbicort 160 two puffs twice a day; use albuterol nebulizer 3-4 times a day. Start regular mucinex 600-1200mg  tablet twice a day. She can take chlorpheniramine tablet every 6 hours as needed for cough. Patient to notify PCP if BS >300-350. FU in 6 weeks with Dr. Silas Flood.

## 2020-09-20 ENCOUNTER — Ambulatory Visit: Payer: BC Managed Care – PPO | Admitting: Rehabilitative and Restorative Service Providers"

## 2020-10-18 ENCOUNTER — Ambulatory Visit
Admission: RE | Admit: 2020-10-18 | Discharge: 2020-10-18 | Disposition: A | Discharge status: Home / Self Care | Payer: BC Managed Care – PPO | Source: Ambulatory Visit | Attending: Obstetrics and Gynecology | Admitting: Obstetrics and Gynecology

## 2020-10-18 ENCOUNTER — Other Ambulatory Visit: Payer: Self-pay

## 2020-10-18 ENCOUNTER — Other Ambulatory Visit: Payer: Self-pay | Admitting: Family Medicine

## 2020-10-18 DIAGNOSIS — N6310 Unspecified lump in the right breast, unspecified quadrant: Secondary | ICD-10-CM

## 2020-10-18 DIAGNOSIS — Z1231 Encounter for screening mammogram for malignant neoplasm of breast: Secondary | ICD-10-CM

## 2020-10-30 ENCOUNTER — Encounter: Payer: Self-pay | Admitting: Pulmonary Disease

## 2020-10-30 ENCOUNTER — Ambulatory Visit: Payer: BC Managed Care – PPO | Admitting: Pulmonary Disease

## 2020-10-30 ENCOUNTER — Other Ambulatory Visit: Payer: Self-pay

## 2020-10-30 VITALS — BP 118/74 | HR 77 | Temp 97.9°F | Ht 62.0 in | Wt 188.6 lb

## 2020-10-30 DIAGNOSIS — R053 Chronic cough: Secondary | ICD-10-CM

## 2020-10-30 DIAGNOSIS — J4530 Mild persistent asthma, uncomplicated: Secondary | ICD-10-CM | POA: Diagnosis not present

## 2020-10-30 NOTE — Progress Notes (Signed)
@Patient  ID: [name], female    DOB: Sep 21, 1955, 65 y.o.   MRN: [id number]  Chief Complaint  Patient presents with  . Follow-up    2 mo f/u. She was diagnosed with covid back in March 2022. She has noticed that after her 2nd bout with COVID, her cough is now productive with greyish phlegm. SOB has remained stable.     Referring provider: Antony Contras, MD  HPI:   65 year old woman who previously followed in pulmonary clinic with Dr. Melvyn Novas with asthma here for follow up of recent asthma exacerbation with COVID 19. Note from Renee Barrow, NP reviewed. ED note x 2 reviewed.  Breathing is stable - not much of an issue with dyspnea. Cough persists. All day. Intermittently productive. Got covid end of 08/2020. Cough worse, new DOE. Seen in office and given steroid taper. Breathing improved quickly. Cough not so much. Also thinks pollen is making cough worse. Received sterpid course 04/2020 as well.  Went to ED x 2 since last visit with cough. CXR 05/03/20 and 09/19/20 reviewed and interpreted as clear lungs.   HPI at initial visit: Patient has been diagnosed with asthma for years.  Usually relatively well controlled.  Uses Symbicort mid dose.  About once or twice a years with changes states since, spring and fall she has worsening breathing.  This occurred earlier in the month.  All of a sudden she had significant chest tightness, wheeze, shortness of breath.  Associated with hacking cough.  Dry.  Went to urgent care and received a dose of IV steroids.  Her symptoms have improved a good deal but still has lingering cough and mild chest tightness, shortness of breath.  No fever or chills.  No sick contacts.  Denies any viral symptoms such as sore throat, runny nose, fever at time of onset of symptoms.  When asked, she does think maybe she had a little more nasal congestion on the days preceding this acute change.  Chest x-ray 03/04/2020 reviewed and interpreted as clear lungs with mild  hyperinflation with increased AP diameter on lateral film.  Prior labs reviewed which are notable for elevated IgE greater than 1200 and eosinophils as high as 800 in the past.  PMH: Asthma, diabetes, history of pancreatitis Surgical history: Hysterectomy Family history: Reviewed, denies significant family history of respiratory illness Social history: Former smoker, 4-pack-year smoking history quit 40 years ago, grew up in [place], Education officer, museum to [employer], raising 4 grandchildren all girls   Questionaires / Pulmonary Flowsheets:   ACT:  Asthma Control Test ACT Total Score  09/18/2020 11    MMRC: No flowsheet data found.  Epworth:  No flowsheet data found.  Tests:   FENO:  No results found for: NITRICOXIDE  PFT: No flowsheet data found.  WALK:  No flowsheet data found.  Imaging: Personally reviewed, per EMR and discussion in this note  Lab Results: Personally reviewed, eosinophils elevated up to 800 in the past, IgE greater than 1200 CBC    Component Value Date/Time   WBC 7.4 03/28/2019 1326   RBC 4.68 03/28/2019 1326   HGB 14.6 03/28/2019 1328   HGB 13.4 08/02/2017 1035   HCT 43.0 03/28/2019 1328   HCT 40.7 08/02/2017 1035   PLT 263 03/28/2019 1326   MCV 90.2 03/28/2019 1326   MCV 86 08/02/2017 1035   MCH 30.6 03/28/2019 1326   MCHC 33.9 03/28/2019 1326   RDW 13.2 03/28/2019 1326   RDW 14.9 08/02/2017 1035   LYMPHSABS  3.5 03/28/2019 1326   LYMPHSABS 2.2 08/02/2017 1035   MONOABS 0.6 03/28/2019 1326   EOSABS 0.4 03/28/2019 1326   EOSABS 0.2 08/02/2017 1035   BASOSABS 0.1 03/28/2019 1326   BASOSABS 0.0 08/02/2017 1035    BMET    Component Value Date/Time   NA 139 03/28/2019 1328   NA 141 08/02/2017 1035   K 4.8 03/28/2019 1328   CL 105 03/28/2019 1328   CO2 24 03/28/2019 1326   GLUCOSE 153 (H) 03/28/2019 1328   BUN 18 03/28/2019 1328   BUN 14 08/02/2017 1035   CREATININE 0.90 03/28/2019 1328   CALCIUM 9.5 03/28/2019 1326    GFRNONAA >60 03/28/2019 1326   GFRAA >60 03/28/2019 1326    BNP No results found for: BNP  ProBNP No results found for: PROBNP  Specialty Problems      Pulmonary Problems   Cough variant asthma    Followed in Pulmonary clinic/  [facility]/ [facility]   - allergy profile 01/03/2015 > 0.8, IgE 1256 cat > dog  Dust/ mold - sinus ct > Pos > see sinusitis - 01/17/2015 p extensive coaching HFA effectiveness =   75% > continue symbicort 80 2bid       Sinusitis, chronic    Sinus CT 01/08/2015  Multifocal paranasal sinus disease, most pronounced in the left ethmoid air cell complex and left sphenoid sinus region. Bilateral ostiomeatal unit obstruction. 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 No air-fluid levels.> referred to ENT 01/17/2015 >>>       Shortness of breath on exertion   Asthma exacerbation      Allergies  Allergen Reactions  . Naproxen     "hives, I had to call 911"   . Diclofenac     Hives   . Penicillins Hives    Has patient had a PCN reaction causing immediate rash, facial/tongue/throat swelling, SOB or lightheadedness with hypotension: Yes Has patient had a PCN reaction causing severe rash involving mucus membranes or skin necrosis: Yes Has patient had a PCN reaction that required hospitalization No Has patient had a PCN reaction occurring within the last 10 years: No. If all of the above answers are "NO", then may proceed with Cephalosporin use.     Immunization History  Administered Date(s) Administered  . Influenza Split 03/22/2014  . Influenza,inj,quad, With Preservative 04/23/2019, 03/22/2020  . PFIZER(Purple Top)SARS-COV-2 Vaccination 08/19/2019, 09/09/2019    Past Medical History:  Diagnosis Date  . Anemia 2006   required transfusion post TAH/BSO 05/2005  . Arthritis   . Asthma   . Chronic cough   . Colon polyps    hyperplastic  . Diabetes mellitus without complication ([place])   . Diverticulosis   . GERD (gastroesophageal  reflux disease)    on prilosec  . GLA deficiency ([place])   . Glaucoma of both eyes   . Hypertension   . Joint pain   . Osteoarthritis   . Pancreatitis   . Shortness of breath dyspnea   . Vitamin D deficiency     Tobacco History: Social History   Tobacco Use  Smoking Status Former Smoker  . Packs/day: 0.25  . Years: 15.00  . Pack years: 3.75  . Types: Cigarettes  . Quit date: 06/22/1978  . Years since quitting: 42.3  Smokeless Tobacco Never Used   Counseling given: Not Answered   Continue to not smoke  Outpatient Encounter Medications as of 10/30/2020  Medication Sig  . albuterol (PROVENTIL) (2.5 MG/3ML) 0.083% nebulizer solution Take 3  mLs (2.5 mg total) by nebulization every 6 (six) hours as needed for wheezing or shortness of breath.  Marland Kitchen aspirin EC 81 MG EC tablet Take 1 tablet (81 mg total) by mouth daily.  Marland Kitchen atorvastatin (LIPITOR) 20 MG tablet Take 1 tablet (20 mg total) by mouth daily at 6 PM.  . budesonide-formoterol (SYMBICORT) 160-4.5 MCG/ACT inhaler Inhale 2 puffs into the lungs in the morning and at bedtime.  . cetirizine (ZYRTEC ALLERGY) 10 MG tablet Take 1 tablet (10 mg total) by mouth daily.  . chlorpheniramine (CHLOR-TRIMETON) 4 MG tablet Take 1 tablet (4 mg total) by mouth every 6 (six) hours as needed for allergies.  . fluticasone (FLONASE) 50 MCG/ACT nasal spray Place 1 spray into both nostrils daily.  Marland Kitchen glucose blood test strip Use as instructed  . influenza vac split quadrivalent PF (AFLURIA QUADRIVALENT) 0.5 ML injection Afluria Qd 2020-21 (36 mos up)(PF)60 mcg (15 mcg x4)/0.5 mL IM syringe  PHARMACY ADMINISTERED  . MELATONIN PO Take 1 tablet by mouth at bedtime as needed (sleep).   . metFORMIN (GLUCOPHAGE) 1000 MG tablet Take 1,000 mg by mouth 2 (two) times daily with a meal.  . montelukast (SINGULAIR) 10 MG tablet Take 1 tablet (10 mg total) by mouth at bedtime.  Marland Kitchen omeprazole (PRILOSEC) 20 MG capsule omeprazole 20 mg capsule,delayed release  .  pioglitazone (ACTOS) 15 MG tablet Take 15 mg by mouth daily.  . promethazine-dextromethorphan (PROMETHAZINE-DM) 6.25-15 MG/5ML syrup Take 5 mLs by mouth 4 (four) times daily as needed for cough.  . Travoprost, BAK Free, (TRAVATAN) 0.004 % SOLN ophthalmic solution   . [DISCONTINUED] HYDROcodone-homatropine (HYCODAN) 5-1.5 MG/5ML syrup 5 ML AS NEEDED ORALLY EVERY 6 HRS 5 DAYS  . [DISCONTINUED] meloxicam (MOBIC) 7.5 MG tablet Take 1 tablet (7.5 mg total) by mouth 2 (two) times daily between meals as needed for pain.  . [DISCONTINUED] methocarbamol (ROBAXIN) 750 MG tablet Take 1 tablet (750 mg total) by mouth every 8 (eight) hours as needed for muscle spasms.  . [DISCONTINUED] predniSONE (DELTASONE) 10 MG tablet Take 4 tabs po daily x 3 days; then 3 tabs daily x3 days; then 2 tabs daily x3 days; then 1 tab daily x 3 days; then stop  . [DISCONTINUED] traMADol (ULTRAM) 50 MG tablet Take 2 tablets (100 mg total) by mouth every 6 (six) hours as needed.   No facility-administered encounter medications on file as of 10/30/2020.     Review of Systems  Review of Systems  n/a Physical Exam  BP 118/74   Pulse 77   Temp 97.9 F (36.6 C) (Temporal)   Ht 5\' 2"  (1.575 m)   Wt 188 lb 9.6 oz (85.5 kg)   SpO2 97% Comment: on RA  BMI 34.50 kg/m   Wt Readings from Last 5 Encounters:  10/30/20 188 lb 9.6 oz (85.5 kg)  09/18/20 193 lb (87.5 kg)  08/12/20 188 lb (85.3 kg)  04/17/20 201 lb 12.8 oz (91.5 kg)  11/14/19 209 lb (94.8 kg)    BMI Readings from Last 5 Encounters:  10/30/20 34.50 kg/m  09/18/20 35.30 kg/m  08/12/20 34.39 kg/m  04/17/20 36.91 kg/m  11/14/19 38.23 kg/m     Physical Exam General: Well-appearing, no acute distress Eyes: EOMI PERRLA Neck: Supple, no JVD Respiratory: Diffuse faint end expiratory wheezes bilateral upper lung fields, normal work of breathing Cardiovascular: Regular rate and rhythm, no murmurs   Assessment & Plan:   Cough: Suspect related to poorly  controlled asthma symptoms. Has responded to sterids  in past.   Asthma: Elevated Eos and IgE in past. Recent exacerbation 09/19/2020 in setting of COVID 19 infection. On high dose symbicort and singulair. Steroid course x 2 since last visit. Repeat IgE and eos today, suspect will need biologics to control cough. Fortunately, dyspnea is doing ok.    Return in about 3 months (around 01/30/2021).   Lanier Clam, MD 10/30/2020

## 2020-10-30 NOTE — Patient Instructions (Signed)
Nice to see you again  I worry the cough is related to the asthma as he had a bit of a wheeze today on exam.  I am glad the breathing is doing okay.  We will get labs today as in the past both eosinophils and IgE have been high.  We can target these with new medicines because the eosinophils and IgE can cause uncontrolled asthma symptoms.  The new medicines are shots.  Based on the results of the lab test we will decide which medicine will be best.  No changes to medicines for now.  Return to clinic in 3 months or sooner as needed for follow-up with Dr. Silas Flood.  Hopefully we will start of the new medicine in the interim we can assess how well it is helping at the follow-up visit.

## 2020-10-31 LAB — CBC WITH DIFFERENTIAL/PLATELET
Basophils Absolute: 0.1 10*3/uL (ref 0.0–0.1)
Basophils Relative: 1.1 % (ref 0.0–3.0)
Eosinophils Absolute: 0.4 10*3/uL (ref 0.0–0.7)
Eosinophils Relative: 7.5 % — ABNORMAL HIGH (ref 0.0–5.0)
HCT: 37.4 % (ref 36.0–46.0)
Hemoglobin: 12.5 g/dL (ref 12.0–15.0)
Lymphocytes Relative: 38.4 % (ref 12.0–46.0)
Lymphs Abs: 1.8 10*3/uL (ref 0.7–4.0)
MCHC: 33.3 g/dL (ref 30.0–36.0)
MCV: 89.5 fl (ref 78.0–100.0)
Monocytes Absolute: 0.6 10*3/uL (ref 0.1–1.0)
Monocytes Relative: 13.1 % — ABNORMAL HIGH (ref 3.0–12.0)
Neutro Abs: 1.9 10*3/uL (ref 1.4–7.7)
Neutrophils Relative %: 39.9 % — ABNORMAL LOW (ref 43.0–77.0)
Platelets: 266 10*3/uL (ref 150.0–400.0)
RBC: 4.18 Mil/uL (ref 3.87–5.11)
RDW: 14.6 % (ref 11.5–15.5)
WBC: 4.7 10*3/uL (ref 4.0–10.5)

## 2020-10-31 LAB — IGE: IgE (Immunoglobulin E), Serum: 967 kU/L — ABNORMAL HIGH (ref <=114)

## 2020-11-12 ENCOUNTER — Telehealth: Payer: Self-pay | Admitting: Pulmonary Disease

## 2020-11-12 MED ORDER — PREDNISONE 20 MG PO TABS: 40.0000 mg | ORAL_TABLET | Freq: Every day | ORAL | 0 refills | Status: AC

## 2020-11-12 NOTE — Telephone Encounter (Signed)
Spoke with pt, aware of recs. Prednisone has already been sent to pharmacy. Nothing further needed at this time- will close encounter.

## 2020-11-12 NOTE — Telephone Encounter (Signed)
Prednisone 40 mg daily x 5 days. Will send in now.

## 2020-11-12 NOTE — Telephone Encounter (Signed)
I have called the pt and she is aware of meds that have been sent to her pharmacy.

## 2020-11-12 NOTE — Telephone Encounter (Signed)
I called and spoke with pt and she stated that she was seen by [facility] on 10/30/20.  She stated that [place] could hear her wheezing at that visit.  She stated that she has gotten worse in the last 2 days.  She stated that her chest is tight from the cough with clear sputum.  She denies any fever or body aches, other than the chest from coughing.    She is wanting further recs from [facility]. Please advise.. thanks

## 2020-12-09 ENCOUNTER — Ambulatory Visit
Admission: RE | Admit: 2020-12-09 | Discharge: 2020-12-09 | Disposition: A | Discharge status: Home / Self Care | Payer: BC Managed Care – PPO | Source: Ambulatory Visit | Attending: Family Medicine | Admitting: Family Medicine

## 2020-12-09 ENCOUNTER — Other Ambulatory Visit: Payer: Self-pay

## 2020-12-09 DIAGNOSIS — Z1231 Encounter for screening mammogram for malignant neoplasm of breast: Secondary | ICD-10-CM

## 2021-01-05 ENCOUNTER — Telehealth: Payer: Self-pay | Admitting: Pulmonary Disease

## 2021-01-05 MED ORDER — PREDNISONE 10 MG (21) PO TBPK: ORAL_TABLET | Freq: Every day | ORAL | 0 refills | Status: AC

## 2021-01-05 NOTE — Telephone Encounter (Signed)
Wheezing x 3 days Compliant with symbicort Has used alb neb thrice   Sent in Pred 40 mg x 5 days  OV if no better in 3 days

## 2021-01-23 NOTE — Telephone Encounter (Signed)
Dr. Silas Flood,   This message was received for you today.    Dr. Silas Flood What are the next steps concerning my treatment.  I continue to have episodes of weezing that require steroids. I use the symbicort inhaler 160/4.5 twice daily.

## 2021-01-28 ENCOUNTER — Telehealth: Payer: Self-pay | Admitting: Pharmacist

## 2021-01-28 NOTE — Telephone Encounter (Addendum)
Please start Xolair BIV.  Dose: '375mg'$  every 2 weeks  Dx: J45.50 (severe persistent asthma)  IgE 967 on 10/30/20 Abs eosinophil 400 on 10/30/20  Current regimen:  Symbicort max dose, flonase daily, montelukast '10mg'$  nightly  Will also require Epipen to start [place], PharmD, MPH, BCPS Clinical Pharmacist (Rheumatology and Pulmonology)

## 2021-01-29 ENCOUNTER — Other Ambulatory Visit (HOSPITAL_COMMUNITY): Payer: Self-pay

## 2021-01-29 MED ORDER — EPINEPHRINE 0.3 MG/0.3ML IJ SOAJ: 0.3000 mg | INTRAMUSCULAR | 2 refills | Status: DC | PRN

## 2021-01-29 NOTE — Telephone Encounter (Signed)
Rx for Epipen sent to [facility] on [address]. Advisde patient not to pick up until Xolair is approved and she feels comfortable starting. Per test claim, generic epinephrine autoinjector has copay for $30  I reviewed office policy for Xolair, monitoring time, and Epipen requirement.  She expresses some concern about Xolair. We reviewed that anaphylactic reaction is rare but we have safety protocol in place. Reviewed that her IgE was elevated and Dr. Silas Flood believes that Xolair will work well for her for this reason. Advised that she may do her own research for Xolair and we can touch base once pharmacy team has update on Xolair  Knox Saliva, PharmD, MPH, BCPS Clinical Pharmacist (Rheumatology and Pulmonology)

## 2021-01-29 NOTE — Telephone Encounter (Signed)
Submitted a Prior Authorization request to [facility] for XOLAIR via CoverMyMeds. Will update once we receive a response.  KeyMadaline Brilliant - PA Case ID: [id number]

## 2021-01-30 ENCOUNTER — Other Ambulatory Visit (HOSPITAL_COMMUNITY): Payer: Self-pay

## 2021-01-30 NOTE — Telephone Encounter (Signed)
ATC patient to schedule Xolair new start. She expresses some concerns with starting d/t Epipen requirement. We reviewed that risk is uncommon but frequent enough to warrant 2-hour monitoring time for first injection. Advised that she must have Epipen on hand and in date at each visit since I have to train her on Epipen use as well. Patient verbalized understanding.  She will plan to do some research on medication and call me back once she feels comfortable with starting Xolair so we can schedule her for new start. She will plan to pick up Epipen prior to appointment.  Knox Saliva, PharmD, MPH, BCPS Clinical Pharmacist (Rheumatology and Pulmonology)

## 2021-01-30 NOTE — Telephone Encounter (Signed)
Received notification from [facility] regarding a prior authorization for XOLAIR. Authorization has been APPROVED from 01/30/21 to 01/30/22.   Patient must fill through [place]: [phone]  Authorization # [id number]   [place] card:  Member ID: [id number]  Group Number: [id number]  RxBIN: [id number]  PCN: [id number]  Payer ID: [id number]

## 2021-02-13 ENCOUNTER — Encounter: Payer: Self-pay | Admitting: Pulmonary Disease

## 2021-02-13 ENCOUNTER — Other Ambulatory Visit: Payer: Self-pay

## 2021-02-13 ENCOUNTER — Ambulatory Visit (INDEPENDENT_AMBULATORY_CARE_PROVIDER_SITE_OTHER): Payer: BC Managed Care – PPO | Admitting: Pulmonary Disease

## 2021-02-13 VITALS — BP 124/72 | HR 71 | Ht 62.0 in | Wt 192.2 lb

## 2021-02-13 DIAGNOSIS — R0602 Shortness of breath: Secondary | ICD-10-CM

## 2021-02-13 DIAGNOSIS — J45991 Cough variant asthma: Secondary | ICD-10-CM

## 2021-02-13 NOTE — Patient Instructions (Signed)
Nice to see you again  I am sorry the breathing has been a struggle over the last several months.  I think the report of the Xolair to help with asthma symptoms is the right thing to do.  I totally understand your apprehension.  I will send him to our pharmacist a message to get in touch with you about scheduling the first dose.  Please pick up the EpiPen that was prescribed.  Return to clinic in 2 months or sooner as needed with Dr. Silas Flood

## 2021-02-14 NOTE — Telephone Encounter (Signed)
Patient had appt with Dr. Silas Flood on 02/13/21 and after discussion with him, she has decided to move forward with Xolair. Will schedule new start  Knox Saliva, PharmD, MPH, BCPS Clinical Pharmacist (Rheumatology and Pulmonology)

## 2021-02-14 NOTE — Telephone Encounter (Signed)
Patient scheduled for Xolair new start on 02/20/21 @ 2:20pm. Patient aware of Epipen requirement and 2 hour monitoring time for 1st visit.  She will pick up Epipen from pharmacy today  Knox Saliva, PharmD, MPH, BCPS Clinical Pharmacist (Rheumatology and Pulmonology)

## 2021-02-17 NOTE — Progress Notes (Signed)
$'@Patient't$  ID: [name], female    DOB: Jan 07, 1956, 65 y.o.   MRN: [id number]  Chief Complaint  Patient presents with   Follow-up    3 mo f/u for asthma. States her breathing has not been well since last visit. Wants to discuss the biologic therapy.     Referring provider: Antony Contras, MD  HPI:   65 y.o.woman who previously followed in pulmonary clinic with Dr. Melvyn Novas with asthma here for follow up of DOE felt to be poorly controlled asthma exacerbated by COVID infection.  Breathing is stable. Some DOE. Cough is bad, not improved. Got steroids x 2 in interim since last visit. Last week via PCP finished last dose prednisone yesterday. Advised starting xolair in interim since last visit. Multiple telephone encounters reviewed between patient and pharmacist. She is hesitant to begin xolair given concerns of side effects, ned for epipen.  Discussed risks of Xolair and answered all questions today.  She agrees to move forward with Xolair injections.  HPI at initial visit: Patient has been diagnosed with asthma for years.  Usually relatively well controlled.  Uses Symbicort mid dose.  About once or twice a years with changes states since, spring and fall she has worsening breathing.  This occurred earlier in the month.  All of a sudden she had significant chest tightness, wheeze, shortness of breath.  Associated with hacking cough.  Dry.  Went to urgent care and received a dose of IV steroids.  Her symptoms have improved a good deal but still has lingering cough and mild chest tightness, shortness of breath.  No fever or chills.  No sick contacts.  Denies any viral symptoms such as sore throat, runny nose, fever at time of onset of symptoms.  When asked, she does think maybe she had a little more nasal congestion on the days preceding this acute change.  Chest x-ray 03/04/2020 reviewed and interpreted as clear lungs with mild hyperinflation with increased AP diameter on lateral film.  Prior labs  reviewed which are notable for elevated IgE greater than 1200 and eosinophils as high as 800 in the past.  PMH: Asthma, diabetes, history of pancreatitis Surgical history: Hysterectomy Family history: Reviewed, denies significant family history of respiratory illness Social history: Former smoker, 4-pack-year smoking history quit 40 years ago, grew up in [place], Education officer, museum to [employer], raising 4 grandchildren all girls   Questionaires / Pulmonary Flowsheets:   ACT:  Asthma Control Test ACT Total Score  09/18/2020 64    MMRC: No flowsheet data found.  Epworth:  No flowsheet data found.  Tests:   FENO:  No results found for: NITRICOXIDE  PFT: No flowsheet data found.  WALK:  No flowsheet data found.  Imaging: Personally reviewed, per EMR and discussion in this note  Lab Results: Personally reviewed, eosinophils elevated up to 800 in the past, IgE greater than 1200 CBC    Component Value Date/Time   WBC 4.7 10/30/2020 1556   RBC 4.18 10/30/2020 1556   HGB 12.5 10/30/2020 1556   HGB 13.4 08/02/2017 1035   HCT 37.4 10/30/2020 1556   HCT 40.7 08/02/2017 1035   PLT 266.0 10/30/2020 1556   MCV 89.5 10/30/2020 1556   MCV 86 08/02/2017 1035   MCH 30.6 03/28/2019 1326   MCHC 33.3 10/30/2020 1556   RDW 14.6 10/30/2020 1556   RDW 14.9 08/02/2017 1035   LYMPHSABS 1.8 10/30/2020 1556   LYMPHSABS 2.2 08/02/2017 1035   MONOABS 0.6 10/30/2020 1556  EOSABS 0.4 10/30/2020 1556   EOSABS 0.2 08/02/2017 1035   BASOSABS 0.1 10/30/2020 1556   BASOSABS 0.0 08/02/2017 1035    BMET    Component Value Date/Time   NA 139 03/28/2019 1328   NA 141 08/02/2017 1035   K 4.8 03/28/2019 1328   CL 105 03/28/2019 1328   CO2 24 03/28/2019 1326   GLUCOSE 153 (H) 03/28/2019 1328   BUN 18 03/28/2019 1328   BUN 14 08/02/2017 1035   CREATININE 0.90 03/28/2019 1328   CALCIUM 9.5 03/28/2019 1326   GFRNONAA >60 03/28/2019 1326   GFRAA >60 03/28/2019 1326    BNP No  results found for: BNP  ProBNP No results found for: PROBNP  Specialty Problems       Pulmonary Problems   Cough variant asthma    Followed in Pulmonary clinic/ [facility]/ [place]   - allergy profile 01/03/2015 > 0.8, IgE 1256 cat > dog  Dust/ mold - sinus ct > Pos > see sinusitis - 01/17/2015 p extensive coaching HFA effectiveness =   75% > continue symbicort 80 2bid       Sinusitis, chronic    Sinus CT 01/08/2015  Multifocal paranasal sinus disease, most pronounced in the left ethmoid air cell complex and left sphenoid sinus region. Bilateral ostiomeatal unit obstruction. 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 No air-fluid levels.> referred to ENT 01/17/2015 >>>       Shortness of breath on exertion   Asthma exacerbation    Allergies  Allergen Reactions   Naproxen     "hives, I had to call 911"    Diclofenac     Hives    Penicillins Hives    Has patient had a PCN reaction causing immediate rash, facial/tongue/throat swelling, SOB or lightheadedness with hypotension: Yes Has patient had a PCN reaction causing severe rash involving mucus membranes or skin necrosis: Yes Has patient had a PCN reaction that required hospitalization No Has patient had a PCN reaction occurring within the last 10 years: No. If all of the above answers are "NO", then may proceed with Cephalosporin use.     Immunization History  Administered Date(s) Administered   Influenza Split 03/22/2014   Influenza,inj,quad, With Preservative 04/23/2019, 03/22/2020   PFIZER(Purple Top)SARS-COV-2 Vaccination 08/19/2019, 09/09/2019    Past Medical History:  Diagnosis Date   Anemia 2006   required transfusion post TAH/BSO 05/2005   Arthritis    Asthma    Chronic cough    Colon polyps    hyperplastic   Diabetes mellitus without complication (HCC)    Diverticulosis    GERD (gastroesophageal reflux disease)    on prilosec   GLA deficiency ([place])    Glaucoma of both eyes     Hypertension    Joint pain    Osteoarthritis    Pancreatitis    Shortness of breath dyspnea    Vitamin D deficiency     Tobacco History: Social History   Tobacco Use  Smoking Status Former   Packs/day: 0.25   Years: 15.00   Pack years: 3.75   Types: Cigarettes   Quit date: 06/22/1978   Years since quitting: 42.6  Smokeless Tobacco Never   Counseling given: Not Answered   Continue to not smoke  Outpatient Encounter Medications as of 02/13/2021  Medication Sig   albuterol (PROVENTIL) (2.5 MG/3ML) 0.083% nebulizer solution Take 3 mLs (2.5 mg total) by nebulization every 6 (six) hours as needed for wheezing or shortness of breath.  aspirin EC 81 MG EC tablet Take 1 tablet (81 mg total) by mouth daily.   atorvastatin (LIPITOR) 20 MG tablet Take 1 tablet (20 mg total) by mouth daily at 6 PM.   budesonide-formoterol (SYMBICORT) 160-4.5 MCG/ACT inhaler Inhale 2 puffs into the lungs in the morning and at bedtime.   cetirizine (ZYRTEC ALLERGY) 10 MG tablet Take 1 tablet (10 mg total) by mouth daily.   chlorpheniramine (CHLOR-TRIMETON) 4 MG tablet Take 1 tablet (4 mg total) by mouth every 6 (six) hours as needed for allergies.   EPINEPHrine 0.3 mg/0.3 mL IJ SOAJ injection Inject 0.3 mg into the muscle as needed for anaphylaxis.   fluticasone (FLONASE) 50 MCG/ACT nasal spray Place 1 spray into both nostrils daily.   glucose blood test strip Use as instructed   influenza vac split quadrivalent PF (AFLURIA QUADRIVALENT) 0.5 ML injection Afluria Qd 2020-21 (36 mos up)(PF)60 mcg (15 mcg x4)/0.5 mL IM syringe  PHARMACY ADMINISTERED   MELATONIN PO Take 1 tablet by mouth at bedtime as needed (sleep).    metFORMIN (GLUCOPHAGE) 1000 MG tablet Take 1,000 mg by mouth 2 (two) times daily with a meal.   montelukast (SINGULAIR) 10 MG tablet Take 1 tablet (10 mg total) by mouth at bedtime.   omeprazole (PRILOSEC) 20 MG capsule omeprazole 20 mg capsule,delayed release   pioglitazone (ACTOS) 15 MG tablet  Take 15 mg by mouth daily.   Travoprost, BAK Free, (TRAVATAN) 0.004 % SOLN ophthalmic solution    [DISCONTINUED] promethazine-dextromethorphan (PROMETHAZINE-DM) 6.25-15 MG/5ML syrup Take 5 mLs by mouth 4 (four) times daily as needed for cough.   No facility-administered encounter medications on file as of 02/13/2021.     Review of Systems  Review of Systems  n/a Physical Exam  BP 124/72   Pulse 71   Ht '5\' 2"'$  (1.575 m)   Wt 192 lb 3.2 oz (87.2 kg)   SpO2 98% Comment: on RA  BMI 35.15 kg/m   Wt Readings from Last 5 Encounters:  02/13/21 192 lb 3.2 oz (87.2 kg)  10/30/20 188 lb 9.6 oz (85.5 kg)  09/18/20 193 lb (87.5 kg)  08/12/20 188 lb (85.3 kg)  04/17/20 201 lb 12.8 oz (91.5 kg)    BMI Readings from Last 5 Encounters:  02/13/21 35.15 kg/m  10/30/20 34.50 kg/m  09/18/20 35.30 kg/m  08/12/20 34.39 kg/m  04/17/20 36.91 kg/m     Physical Exam General: Well-appearing, no acute distress Eyes: EOMI PERRLA Neck: Supple, no JVD Respiratory: Clear to auscultation bilaterally, normal work of breathing Cardiovascular: Regular rate and rhythm, no murmurs   Assessment & Plan:   Cough: Suspect related to poorly controlled asthma symptoms. Has responded to steroids.  Xolair as below.     Asthma: Elevated Eos and IgE in past. Recent exacerbation 09/19/2020 in setting of COVID 19 infection and now 2 subsequent exacerbations in the interim.. On high dose symbicort and singulair. Steroid course x 2 since last visit.  Eosinophils and IgE both elevated in the past.  More recently, he is on 400 and IgE 900.  Historically her IgE levels have been elevated out of proportion to the elevation of eosinophils.  Given this, Consult later biologic therapy.  After much hesitancy, she agrees to move forward with therapy today.  We will inform pharmacist.   Return in about 2 months (around 04/15/2021).   Lanier Clam, MD 02/17/2021

## 2021-02-20 ENCOUNTER — Other Ambulatory Visit: Payer: BC Managed Care – PPO | Admitting: Pharmacist

## 2021-02-20 NOTE — Telephone Encounter (Signed)
Patient r/s Xolair new start to 02/25/21

## 2021-02-20 NOTE — Progress Notes (Deleted)
HPI Patient presents today to [facility] to see pharmacy team for Xolair new start.  Past medical history includes ***  Epipen on hand and in date: {yes/no:20286}  Number of hospitalizations in past year: Number of ***COPD/asthma exacerbations in past year:   Respiratory Medications Current regimen: *** Tried in past: *** Patient reports {Adherence challenges yes no:3044014::"adherence challenges","no known adherence challenges"}  OBJECTIVE Allergies  Allergen Reactions   Naproxen     "hives, I had to call 911"    Diclofenac     Hives    Penicillins Hives    Has patient had a PCN reaction causing immediate rash, facial/tongue/throat swelling, SOB or lightheadedness with hypotension: Yes Has patient had a PCN reaction causing severe rash involving mucus membranes or skin necrosis: Yes Has patient had a PCN reaction that required hospitalization No Has patient had a PCN reaction occurring within the last 10 years: No. If all of the above answers are "NO", then may proceed with Cephalosporin use.     Outpatient Encounter Medications as of 02/20/2021  Medication Sig   albuterol (PROVENTIL) (2.5 MG/3ML) 0.083% nebulizer solution Take 3 mLs (2.5 mg total) by nebulization every 6 (six) hours as needed for wheezing or shortness of breath.   aspirin EC 81 MG EC tablet Take 1 tablet (81 mg total) by mouth daily.   atorvastatin (LIPITOR) 20 MG tablet Take 1 tablet (20 mg total) by mouth daily at 6 PM.   budesonide-formoterol (SYMBICORT) 160-4.5 MCG/ACT inhaler Inhale 2 puffs into the lungs in the morning and at bedtime.   cetirizine (ZYRTEC ALLERGY) 10 MG tablet Take 1 tablet (10 mg total) by mouth daily.   chlorpheniramine (CHLOR-TRIMETON) 4 MG tablet Take 1 tablet (4 mg total) by mouth every 6 (six) hours as needed for allergies.   EPINEPHrine 0.3 mg/0.3 mL IJ SOAJ injection Inject 0.3 mg into the muscle as needed for anaphylaxis.   fluticasone (FLONASE) 50 MCG/ACT nasal spray  Place 1 spray into both nostrils daily.   glucose blood test strip Use as instructed   influenza vac split quadrivalent PF (AFLURIA QUADRIVALENT) 0.5 ML injection Afluria Qd 2020-21 (36 mos up)(PF)60 mcg (15 mcg x4)/0.5 mL IM syringe  PHARMACY ADMINISTERED   MELATONIN PO Take 1 tablet by mouth at bedtime as needed (sleep).    metFORMIN (GLUCOPHAGE) 1000 MG tablet Take 1,000 mg by mouth 2 (two) times daily with a meal.   montelukast (SINGULAIR) 10 MG tablet Take 1 tablet (10 mg total) by mouth at bedtime.   omeprazole (PRILOSEC) 20 MG capsule omeprazole 20 mg capsule,delayed release   pioglitazone (ACTOS) 15 MG tablet Take 15 mg by mouth daily.   Travoprost, BAK Free, (TRAVATAN) 0.004 % SOLN ophthalmic solution    No facility-administered encounter medications on file as of 02/20/2021.     Immunization History  Administered Date(s) Administered   Influenza Split 03/22/2014   Influenza,inj,quad, With Preservative 04/23/2019, 03/22/2020   PFIZER(Purple Top)SARS-COV-2 Vaccination 08/19/2019, 09/09/2019     PFTs No flowsheet data found.   Eosinophils Most recent blood eosinophil count was *** cells/microL taken on ***.   IgE: *** on *** Pre-treatment weight:    Assessment   Biologics training for omalizumab (Xolair)  Goals of therapy: Mechanism: IgG monoclonal antibody (recombinant DNA derived) which inhibits IgE binding to the high-affinity IgE receptor on mast cells and basophils.  Reviewed that Xolair is add-on medication and patient must continue maintenance inhaler regimen. Response to therapy: may take 3 to 6 months to determine  efficacy. Discussed that patients generally feel improvement sooner than 3 months.  Side effects: anaphylaxis (0.1%) (black boxed warning), injection site reaction (45%), arthralgia (2.9% to 8%), headache (3% to 15%)  Dose: *** every *** based on pre-treatment IgE of *** and weight of ***kg. Patient advised that each dose will be *** purple and ***  blue pre-filled syringes.  Administration/Storage:  Reviewed administration sites of thigh or abdomen (at least 2-3 inches away from abdomen). Reviewed the upper arm is only appropriate if caregiver is administering injection. Patient advised of injection site reaction management. Discussed that injections may take 5 to 10 seconds to administer (solution is slightly viscous). Do not inject into moles, scars, bruises, tender areas, or broken skin. Do not shake pre-filled syringe as this could lead to product foaming or precipitation. Do not use if solution is discolored or contains particulate matter or if window on prefilled pen is yellow (indicates pen has been used).  Reviewed storage of medication in refrigerator. Reviewed that Xolair can be stored at room temperature in unopened carton for up to 4 hours only.  Access: Approval of Xolair through: {specialtycoverage:25706} Patient enrolled into copay card program to help with copay assistance.  Medication Reconciliation  A drug regimen assessment was performed, including review of allergies, interactions, disease-state management, dosing and immunization history. Medications were reviewed with the patient, including name, instructions, indication, goals of therapy, potential side effects, importance of adherence, and safe use.  Drug interaction(s): ***  Immunizations  Patient is indicated for the influenzae, pneumonia, and shingles vaccinations. Patient has received *** COVID19 vaccines.   PLAN Second Xolair dose will be administered in clinic with 30 minute monitoring period. Scheduled for ***. Patient advised that Epipen must be on hand at each visit. Third Xolair will be administered in clinic with 30 minute monitoring period - appointment will be scheduled at second-dose visit. Rx sent to: {SpecialtyPharmacies:25705}.  Patient provided with pharmacy phone number and advised to call later this week to schedule shipment to home.  Patient provided with copay card information to provide to pharmacy if quoted copay exceeds $5 per month. Continue maintenance asthma regimen of: ***  All questions encouraged and answered.  Instructed patient to reach out with any further questions or concerns.  Thank you for allowing pharmacy to participate in this patient's care.  This appointment required *** minutes of patient care (this includes precharting, chart review, review of results, face-to-face care, etc.).

## 2021-02-25 ENCOUNTER — Other Ambulatory Visit: Payer: Self-pay

## 2021-02-25 ENCOUNTER — Ambulatory Visit: Payer: BC Managed Care – PPO | Admitting: Pharmacist

## 2021-02-25 DIAGNOSIS — Z7189 Other specified counseling: Secondary | ICD-10-CM

## 2021-02-25 DIAGNOSIS — J4530 Mild persistent asthma, uncomplicated: Secondary | ICD-10-CM

## 2021-02-25 DIAGNOSIS — Z79899 Other long term (current) drug therapy: Secondary | ICD-10-CM

## 2021-02-25 MED ORDER — BUDESONIDE-FORMOTEROL FUMARATE 160-4.5 MCG/ACT IN AERO: 2.0000 | INHALATION_SPRAY | Freq: Two times a day (BID) | RESPIRATORY_TRACT | 12 refills | Status: DC

## 2021-02-25 MED ORDER — OMALIZUMAB 75 MG/0.5ML ~~LOC~~ SOSY: 75.0000 mg | PREFILLED_SYRINGE | SUBCUTANEOUS | 0 refills | Status: DC

## 2021-02-25 MED ORDER — OMALIZUMAB 150 MG/ML ~~LOC~~ SOSY: 300.0000 mg | PREFILLED_SYRINGE | SUBCUTANEOUS | 0 refills | Status: DC

## 2021-02-25 NOTE — Telephone Encounter (Signed)
Patient received first Xolair dose in clinic on 02/25/21. Rx sent to [facility] to be shipped to clinic for Week 2 and Week 4 dose.  Will route back to pharmacy pool to f/u and ensure medication is delivered to clinic on time for 03/11/21 appointment  Knox Saliva, PharmD, MPH, BCPS Clinical Pharmacist (Rheumatology and Pulmonology)

## 2021-02-25 NOTE — Patient Instructions (Signed)
Your next Xolair dose is due on 9/20. We will monitor you for 30 minutes next time.  CONTINUE Symbicort. I sent a refill to [facility]  We need your Xolair shipped from [facility] to the clinic. I've sent a prescription but they may reach out to you to provide consent.  Your copay should be affordable. If you call the pharmacy and it is not affordable, please double-check that they are billing through your copay card as secondary coverage. That copay card information is: Member ID: [id number] Group Number: [id number] RxBIN: [id number] PCN: [id number] Payer ID: [id number] Anaphylactic Reactions  Anaphylactic reactions can occur up to 24 hours after administration.  What are the signs and symptoms of anaphylaxis?  Symptoms can vary from a mild skin reaction to more severe reactions including: Wheezing, shortness of breath, cough, chest tightness or trouble breathing Low blood pressure, dizziness, fainting, rapid of weak heartbeat, anxiety, or feeling of "impending doom" Flushing, itching, hives, or feeling warm Swelling of the throat or tongue, throat tightness, hoarse voice, or trouble swallowing  Some of these symptoms require immediate treatment, as they can be life threatening.  If you experience severe symptoms which are bolded above use your Epipen and call 911 for immediate emergency care.  How to manage an injection site reaction: Remember the 5 C's: COUNTER - leave on the counter at least 30 minutes but up to overnight to bring medication to room temperature. This may help prevent stinging COLD - place something cold (like an ice gel pack or cold water bottle) on the injection site just before cleansing with alcohol. This may help reduce pain CLARITIN - use Claritin (generic name is loratadine) for the first two weeks of treatment or the day of, the day before, and the day after injecting. This will help to minimize injection site reactions CORTISONE CREAM - apply if injection site is  irritated and itching CALL ME - if injection site reaction is bigger than the size of your fist, looks infected, blisters, or if you develop hives

## 2021-02-25 NOTE — Progress Notes (Signed)
HPI Patient presents today to [facility] to see pharmacy team for Xolair new start.  Past medical history includes:  cough variant asthma which was further exacerbated by COVID infection. She reports that she does not take Symbicort as prescribed and last used potentially 2 weeks ago.  She remains slightly concerned about risk for anaphylaxis. She does not have a history of anaphylaxis to medications or foods. 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 We reviewed how to use Epipen.   Respiratory Medications Current regimen: Symbicort 160/4.5 mcg (2 puffs twice daily), montelukast '10mg'$  nightly Patient reports no known adherence challenges  OBJECTIVE Allergies  Allergen Reactions   Naproxen     "hives, I had to call 911"    Diclofenac     Hives    Penicillins Hives    Has patient had a PCN reaction causing immediate rash, facial/tongue/throat swelling, SOB or lightheadedness with hypotension: Yes Has patient had a PCN reaction causing severe rash involving mucus membranes or skin necrosis: Yes Has patient had a PCN reaction that required hospitalization No Has patient had a PCN reaction occurring within the last 10 years: No. If all of the above answers are "NO", then may proceed with Cephalosporin use.     Outpatient Encounter Medications as of 02/25/2021  Medication Sig   albuterol (PROVENTIL) (2.5 MG/3ML) 0.083% nebulizer solution Take 3 mLs (2.5 mg total) by nebulization every 6 (six) hours as needed for wheezing or shortness of breath.   aspirin EC 81 MG EC tablet Take 1 tablet (81 mg total) by mouth daily.   atorvastatin (LIPITOR) 20 MG tablet Take 1 tablet (20 mg total) by mouth daily at 6 PM.   budesonide-formoterol (SYMBICORT) 160-4.5 MCG/ACT inhaler Inhale 2 puffs into the lungs in the morning and at bedtime.   cetirizine (ZYRTEC ALLERGY) 10 MG tablet Take 1 tablet (10 mg total) by  mouth daily.   chlorpheniramine (CHLOR-TRIMETON) 4 MG tablet Take 1 tablet (4 mg total) by mouth every 6 (six) hours as needed for allergies.   EPINEPHrine 0.3 mg/0.3 mL IJ SOAJ injection Inject 0.3 mg into the muscle as needed for anaphylaxis.   fluticasone (FLONASE) 50 MCG/ACT nasal spray Place 1 spray into both nostrils daily.   glucose blood test strip Use as instructed   influenza vac split quadrivalent PF (AFLURIA QUADRIVALENT) 0.5 ML injection Afluria Qd 2020-21 (36 mos up)(PF)60 mcg (15 mcg x4)/0.5 mL IM syringe  PHARMACY ADMINISTERED   MELATONIN PO Take 1 tablet by mouth at bedtime as needed (sleep).    metFORMIN (GLUCOPHAGE) 1000 MG tablet Take 1,000 mg by mouth 2 (two) times daily with a meal.   montelukast (SINGULAIR) 10 MG tablet Take 1 tablet (10 mg total) by mouth at bedtime.   omeprazole (PRILOSEC) 20 MG capsule omeprazole 20 mg capsule,delayed release   pioglitazone (ACTOS) 15 MG tablet Take 15 mg by mouth daily.   Travoprost, BAK Free, (TRAVATAN) 0.004 % SOLN ophthalmic solution    No facility-administered encounter medications on file as of 02/25/2021.    Immunization History  Administered Date(s) Administered   Influenza Split 03/22/2014   Influenza,inj,quad, With Preservative 04/23/2019, 03/22/2020   PFIZER(Purple Top)SARS-COV-2 Vaccination 08/19/2019, 09/09/2019    PFTs No flowsheet data found.   Eosinophils Most recent blood eosinophil count was 400 cells/microL taken on 02/25/21.   IgE: 967 on 10/30/20  Pre-treatment weight: 87.2  kg  Assessment   Biologics training for omalizumab (Xolair)  Goals of therapy: Mechanism: IgG monoclonal antibody (recombinant DNA derived) which inhibits IgE binding to the high-affinity IgE receptor on mast cells and basophils.  Reviewed that Xolair is add-on medication and patient must continue maintenance inhaler regimen. Response to therapy: may take 3 to 6 months to determine efficacy. Discussed that patients generally feel  improvement sooner than 3 months.  Side effects: anaphylaxis (0.1%) (black boxed warning), injection site reaction (45%), arthralgia (2.9% to 8%), headache (3% to 15%)  Dose: '375mg'$  every 2 weeks based on pre-treatment IgE of 967 and weight of 87kg. Patient advised that each dose will be 2 purple and 1 blue pre-filled syringe.  Administration/Storage:  Reviewed administration sites of thigh or abdomen (at least 2-3 inches away from abdomen). Reviewed the upper arm is only appropriate if caregiver is administering injection. Patient advised of injection site reaction management. Discussed that injections may take 5 to 10 seconds to administer (solution is slightly viscous). Do not inject into moles, scars, bruises, tender areas, or broken skin. Do not shake pre-filled syringe as this could lead to product foaming or precipitation. Do not use if solution is discolored or contains particulate matter or if window on prefilled pen is yellow (indicates pen has been used).  Reviewed storage of medication in refrigerator. Reviewed that Xolair can be stored at room temperature in unopened carton for up to 4 hours only.  Access: Approval of Xolair through: insurance Patient enrolled into copay card program to help with copay assistance.  Patient self-administer in the right abdomen, right thigh, and left thigh with: Xolair '150mg'$ /ml x 2 pre-filled syringe NDC: [address]-01 Lot: [id number] Exp: 10/2021  Xolair '75mg'$ /0.5 mL x 1 pre-filled syringe NDC: [phone] Lot: [id number] Exp: 09/2021  Monitoring after administration of medication at 1:40pm. 2:00pm - no issues noted and patient reports none 2:15pm - no issues 2:45pm - no issues, patient reports no issues 3:00pm - no issues, requesting water d/t slight headache, patient scheduled for 2nd dose 3:20pm - no issues, handed patient AVS and reviewed information written 3:40pm - no issues visible and patient self-reporting no concerns or  issues  Medication Reconciliation  A drug regimen assessment was performed, including review of allergies, interactions, disease-state management, dosing and immunization history. Medications were reviewed with the patient, including name, instructions, indication, goals of therapy, potential side effects, importance of adherence, and safe use.  Drug interaction(s): none noted  Immunizations  Patient is indicated for the influenzae, pneumonia, and shingles vaccinations. Patient has received 2 COVID19 vaccines.   PLAN She will restart taking Symbicort scheduled twice daily. Refills sent to pharmacy. Patient advised that Xolair is an add-on to her maintenance asthma regimen. Second Xolair dose will be administered in clinic with 30 minute monitoring period. Scheduled for 03/11/21 @ 3:30pm. Patient advised that Epipen must be on hand at each visit. Third Xolair will be administered in clinic with 30 minute monitoring period - appointment will be scheduled at second-dose visit. She will plan to review how to use Epipen with household members in case of emergency. Rx sent to: [facility]: [phone] to be delivered to clinic for 2nd and 3rd doses. Pharmacy team will coordinate shipment from pharmacy Continue maintenance asthma regimen of: Symbicort 160/4.5 mcg (2 puffs twice daily), montelukast '10mg'$  nightly. Rx for Symbicort sent to [facility] per patient request.   All questions encouraged and answered.  Instructed patient to reach out with any further questions or concerns.  Thank  you for allowing pharmacy to participate in this patient's care.  This appointment required 150 minutes of patient care (this includes precharting, chart review, review of results, face-to-face care, etc.).  Knox Saliva, PharmD, MPH, BCPS Clinical Pharmacist (Rheumatology and Pulmonology)

## 2021-02-27 ENCOUNTER — Other Ambulatory Visit: Payer: Self-pay | Admitting: Pulmonary Disease

## 2021-02-27 DIAGNOSIS — J4530 Mild persistent asthma, uncomplicated: Secondary | ICD-10-CM

## 2021-02-27 NOTE — Telephone Encounter (Signed)
Called [facility] to schedule shipment of Xolair 2 x '150mg'$  syringes and 1 x 75 mg syringe to clinic.  Attempted to conference call patient to schedule shipment because she has to provide consent. She did not have 15 minutes to spare but she states she will call back on her own time and has their phone number.  Will f/u next week  Knox Saliva, PharmD, MPH, BCPS Clinical Pharmacist (Rheumatology and Pulmonology)

## 2021-03-03 ENCOUNTER — Telehealth: Payer: Self-pay | Admitting: Pulmonary Disease

## 2021-03-03 NOTE — Telephone Encounter (Signed)
Message routed to pharmacy team 

## 2021-03-04 NOTE — Telephone Encounter (Signed)
Returned call to pharmacy to provide clarification on Xolair. Requested expedited review of medication and requested that pharmacist note that patient has second dose appt on 02/08/21  Knox Saliva, PharmD, MPH, BCPS Clinical Pharmacist (Rheumatology and Pulmonology)

## 2021-03-05 NOTE — Telephone Encounter (Signed)
Called [facility] for update on Xolair shipment.  Patient still has not called to provide consent for clinic to schedule Xolair shipment  Knox Saliva, PharmD, MPH, BCPS Clinical Pharmacist (Rheumatology and Pulmonology)

## 2021-03-06 NOTE — Telephone Encounter (Signed)
Received Xolair shipment from [facility] for Week 2 and Week 4 dose which patient will be receiving in the clinic  Knox Saliva, PharmD, MPH, BCPS Clinical Pharmacist (Rheumatology and Pulmonology)

## 2021-03-11 ENCOUNTER — Ambulatory Visit: Payer: BC Managed Care – PPO | Admitting: Pharmacist

## 2021-03-11 ENCOUNTER — Other Ambulatory Visit: Payer: Self-pay

## 2021-03-11 DIAGNOSIS — J4530 Mild persistent asthma, uncomplicated: Secondary | ICD-10-CM

## 2021-03-11 DIAGNOSIS — Z79899 Other long term (current) drug therapy: Secondary | ICD-10-CM

## 2021-03-11 DIAGNOSIS — Z7189 Other specified counseling: Secondary | ICD-10-CM

## 2021-03-11 NOTE — Progress Notes (Signed)
HPI [name] presents today to [facility] to see pharmacy team for second dose of Xolair. First dose received on 02/25/21 with 2 hour monitoring period.  Past medical history includes cough variant asthma which was further exacerbated by COVID infection. She reports that since she started Xolair she has been taking Symbicort as prescribed. States that she continues to have wheezing. We reviewed that it can several months to assess effectiveness of asthma biologics.  Epipen on hand and in date: Yes  Respiratory Medications Current regimen: Symbicort 160/4.5 mcg (2 puffs twice daily), montelukast 10mg  nightly Patient reports no known adherence challenges  OBJECTIVE Allergies  Allergen Reactions   Naproxen     "hives, I had to call 911"    Diclofenac     Hives    Penicillins Hives    Has patient had a PCN reaction causing immediate rash, facial/tongue/throat swelling, SOB or lightheadedness with hypotension: Yes Has patient had a PCN reaction causing severe rash involving mucus membranes or skin necrosis: Yes Has patient had a PCN reaction that required hospitalization No Has patient had a PCN reaction occurring within the last 10 years: No. If all of the above answers are "NO", then may proceed with Cephalosporin use.     Outpatient Encounter Medications as of 03/11/2021  Medication Sig   albuterol (PROVENTIL) (2.5 MG/3ML) 0.083% nebulizer solution Take 3 mLs (2.5 mg total) by nebulization every 6 (six) hours as needed for wheezing or shortness of breath.   aspirin EC 81 MG EC tablet Take 1 tablet (81 mg total) by mouth daily.   atorvastatin (LIPITOR) 20 MG tablet Take 1 tablet (20 mg total) by mouth daily at 6 PM.   budesonide-formoterol (SYMBICORT) 160-4.5 MCG/ACT inhaler Inhale 2 puffs into the lungs in the morning and at bedtime.   cetirizine (ZYRTEC ALLERGY) 10 MG tablet Take 1 tablet (10 mg total) by mouth daily.   chlorpheniramine (CHLOR-TRIMETON) 4 MG tablet Take 1  tablet (4 mg total) by mouth every 6 (six) hours as needed for allergies.   EPINEPHrine 0.3 mg/0.3 mL IJ SOAJ injection Inject 0.3 mg into the muscle as needed for anaphylaxis.   fluticasone (FLONASE) 50 MCG/ACT nasal spray Place 1 spray into both nostrils daily.   glucose blood test strip Use as instructed   influenza vac split quadrivalent PF (AFLURIA QUADRIVALENT) 0.5 ML injection Afluria Qd 2020-21 (36 mos up)(PF)60 mcg (15 mcg x4)/0.5 mL IM syringe  PHARMACY ADMINISTERED   MELATONIN PO Take 1 tablet by mouth at bedtime.   metFORMIN (GLUCOPHAGE) 1000 MG tablet Take 1,000 mg by mouth 2 (two) times daily with a meal.   montelukast (SINGULAIR) 10 MG tablet Take 1 tablet (10 mg total) by mouth at bedtime.   omalizumab Arvid Right) 150 MG/ML prefilled syringe Inject 300 mg into the skin every 14 (fourteen) days. Total dose of 375mg  every 14 days. DELIVER TO CLINIC: [address], [place], [place] [address]   omalizumab Arvid Right) 75 MG/0.5ML prefilled syringe Inject 75 mg into the skin every 14 (fourteen) days. Total dose of 375mg  every 14 days. DELIVER TO clinic: [address], [place], [place] [address]   omeprazole (PRILOSEC) 20 MG capsule omeprazole 20 mg capsule,delayed release   pioglitazone (ACTOS) 15 MG tablet Take 15 mg by mouth daily.   Travoprost, BAK Free, (TRAVATAN) 0.004 % SOLN ophthalmic solution    No facility-administered encounter medications on file as of 03/11/2021.     Immunization History  Administered Date(s) Administered  Influenza Split 03/22/2014   Influenza,inj,quad, With Preservative 04/23/2019, 03/22/2020   PFIZER(Purple Top)SARS-COV-2 Vaccination 08/19/2019, 09/09/2019    PFTs No flowsheet data found.   Eosinophils Most recent blood eosinophil count was 400 cells/microL taken on 02/25/21.    IgE: 967 on 10/30/20   Pre-treatment weight: 87.2 kg  Assessment   Biologics training for omalizumab (Xolair)  Reports no side effects after first dose  and since first dose on 02/25/21  Dose: 375mg  every 2 weeks based on pre-treatment IgE of 967 and weight of 87kg. Patient advised that each dose will be 2 purple and 1 blue pre-filled syringe.  Received medication from [facility] for 4 week supply. Patient self-administered in right thigh, left thigh, and right abdomen with: Xolair 150mg /ml x 2 pre-filled syringe NDC: [address]-01 Lot: [id number] Exp: 11/2021   Xolair 75mg /0.5 mL x 1 pre-filled syringe NDC: [address]-73 Lot: [id number] Exp: 10/2021   Patient monitored for 30 minutes. No issues or concerns.  Medication Reconciliation  A drug regimen assessment was performed, including review of allergies, interactions, disease-state management, dosing and immunization history. Medications were reviewed with the patient, including name, instructions, indication, goals of therapy, potential side effects, importance of adherence, and safe use.  Drug interaction(s): none noted  Immunizations  Patient is indicated for the influenzae, pneumonia, and shingles vaccinations. Patient has received 2 COVID19 vaccines.  PLAN Third Xolair dose will be administered in clinic with 30 minute monitoring period. Medication on hand that was delivered from [place]. Scheduled for 03/25/21. Patient advised that Epipen must be on hand at this visit.  Continue maintenance asthma regimen of: Symbicort 160/4.5 mcg (2 puffs twice daily), montelukast 10mg  nightly. Rx for Symbicort sent to [facility] per patient request.   All questions encouraged and answered.  Instructed patient to reach out with any further questions or concerns.  Thank you for allowing pharmacy to participate in this patient's care.  This appointment required 60 minutes of patient care (this includes precharting, chart review, review of results, face-to-face care, etc.).  Knox Saliva, PharmD, MPH, BCPS Clinical Pharmacist (Rheumatology and Pulmonology)

## 2021-03-11 NOTE — Patient Instructions (Signed)
Your next Xolair dose is due on Tuesday, 10/4. We will monitor you for 30 minutes next time.   CONTINUE Symbicort.  Anaphylactic Reactions   Anaphylactic reactions can occur up to 24 hours after administration.   What are the signs and symptoms of anaphylaxis?   Symptoms can vary from a mild skin reaction to more severe reactions including: Wheezing, shortness of breath, cough, chest tightness or trouble breathing Low blood pressure, dizziness, fainting, rapid of weak heartbeat, anxiety, or feeling of "impending doom" Flushing, itching, hives, or feeling warm Swelling of the throat or tongue, throat tightness, hoarse voice, or trouble swallowing   Some of these symptoms require immediate treatment, as they can be life threatening.  If you experience severe symptoms which are bolded above use your Epipen and call 911 for immediate emergency care.   How to manage an injection site reaction: Remember the 5 C's: COUNTER - leave on the counter at least 30 minutes but up to overnight to bring medication to room temperature. This may help prevent stinging COLD - place something cold (like an ice gel pack or cold water bottle) on the injection site just before cleansing with alcohol. This may help reduce pain CLARITIN - use Claritin (generic name is loratadine) for the first two weeks of treatment or the day of, the day before, and the day after injecting. This will help to minimize injection site reactions CORTISONE CREAM - apply if injection site is irritated and itching CALL ME - if injection site reaction is bigger than the size of your fist, looks infected, blisters, or if you develop hives

## 2021-03-12 ENCOUNTER — Other Ambulatory Visit: Payer: Self-pay | Admitting: Pulmonary Disease

## 2021-03-12 DIAGNOSIS — J4530 Mild persistent asthma, uncomplicated: Secondary | ICD-10-CM

## 2021-03-12 NOTE — Telephone Encounter (Signed)
Received 2 requests from [facility] for patient's Xolair. One was for the 75mg  dose, the other one was for the 150mg  dose.   Pharmacy, can you all help me to determine which dose she should be on? Thanks!

## 2021-03-13 NOTE — Telephone Encounter (Signed)
Patient scheduled for third Xolair injection on 03/25/21. Refill sent today to prevent delay. Will call [facility] after 3rd dose in clinic to provide consent to ship to patient's home for self-administration  Knox Saliva, PharmD, MPH, BCPS Clinical Pharmacist (Rheumatology and Pulmonology)

## 2021-03-15 ENCOUNTER — Encounter (HOSPITAL_COMMUNITY): Payer: Self-pay | Admitting: Emergency Medicine

## 2021-03-15 ENCOUNTER — Emergency Department (HOSPITAL_COMMUNITY): Payer: BC Managed Care – PPO

## 2021-03-15 ENCOUNTER — Emergency Department (HOSPITAL_COMMUNITY)
Admission: EM | Admit: 2021-03-15 | Discharge: 2021-03-15 | Disposition: A | Discharge status: Home / Self Care | Payer: BC Managed Care – PPO | Attending: Emergency Medicine | Admitting: Emergency Medicine

## 2021-03-15 ENCOUNTER — Other Ambulatory Visit: Payer: Self-pay

## 2021-03-15 DIAGNOSIS — R0789 Other chest pain: Secondary | ICD-10-CM | POA: Diagnosis present

## 2021-03-15 DIAGNOSIS — J45909 Unspecified asthma, uncomplicated: Secondary | ICD-10-CM | POA: Diagnosis not present

## 2021-03-15 DIAGNOSIS — Z7984 Long term (current) use of oral hypoglycemic drugs: Secondary | ICD-10-CM | POA: Insufficient documentation

## 2021-03-15 DIAGNOSIS — Z87891 Personal history of nicotine dependence: Secondary | ICD-10-CM | POA: Diagnosis not present

## 2021-03-15 DIAGNOSIS — E119 Type 2 diabetes mellitus without complications: Secondary | ICD-10-CM | POA: Diagnosis not present

## 2021-03-15 DIAGNOSIS — Z7982 Long term (current) use of aspirin: Secondary | ICD-10-CM | POA: Diagnosis not present

## 2021-03-15 DIAGNOSIS — I10 Essential (primary) hypertension: Secondary | ICD-10-CM | POA: Insufficient documentation

## 2021-03-15 LAB — CBC WITH DIFFERENTIAL/PLATELET
Abs Immature Granulocytes: 0.06 10*3/uL (ref 0.00–0.07)
Basophils Absolute: 0 10*3/uL (ref 0.0–0.1)
Basophils Relative: 0 %
Eosinophils Absolute: 0 10*3/uL (ref 0.0–0.5)
Eosinophils Relative: 0 %
HCT: 38.1 % (ref 36.0–46.0)
Hemoglobin: 12.1 g/dL (ref 12.0–15.0)
Immature Granulocytes: 1 %
Lymphocytes Relative: 6 %
Lymphs Abs: 0.4 10*3/uL — ABNORMAL LOW (ref 0.7–4.0)
MCH: 29.8 pg (ref 26.0–34.0)
MCHC: 31.8 g/dL (ref 30.0–36.0)
MCV: 93.8 fL (ref 80.0–100.0)
Monocytes Absolute: 0.3 10*3/uL (ref 0.1–1.0)
Monocytes Relative: 4 %
Neutro Abs: 6.2 10*3/uL (ref 1.7–7.7)
Neutrophils Relative %: 89 %
Platelets: 269 10*3/uL (ref 150–400)
RBC: 4.06 MIL/uL (ref 3.87–5.11)
RDW: 14.2 % (ref 11.5–15.5)
WBC: 7 10*3/uL (ref 4.0–10.5)
nRBC: 0 % (ref 0.0–0.2)

## 2021-03-15 LAB — BASIC METABOLIC PANEL
Anion gap: 11 (ref 5–15)
BUN: 13 mg/dL (ref 8–23)
CO2: 21 mmol/L — ABNORMAL LOW (ref 22–32)
Calcium: 9.3 mg/dL (ref 8.9–10.3)
Chloride: 106 mmol/L (ref 98–111)
Creatinine, Ser: 1.06 mg/dL — ABNORMAL HIGH (ref 0.44–1.00)
GFR, Estimated: 59 mL/min — ABNORMAL LOW (ref >60)
Glucose, Bld: 493 mg/dL — ABNORMAL HIGH (ref 70–99)
Potassium: 3.7 mmol/L (ref 3.5–5.1)
Sodium: 138 mmol/L (ref 135–145)

## 2021-03-15 LAB — TROPONIN I (HIGH SENSITIVITY)
Troponin I (High Sensitivity): 7 ng/L (ref <18)
Troponin I (High Sensitivity): 7 ng/L (ref <18)

## 2021-03-15 MED ORDER — IPRATROPIUM-ALBUTEROL 0.5-2.5 (3) MG/3ML IN SOLN
3.0000 mL | Freq: Once | RESPIRATORY_TRACT | Status: AC
  Administered 2021-03-15: 3 mL via RESPIRATORY_TRACT
  Filled 2021-03-15: qty 3

## 2021-03-15 NOTE — ED Triage Notes (Signed)
Pt c/o cough, shortness of breath and wheezing x 3 days and chest pain that started today. Hx asthma.

## 2021-03-15 NOTE — Discharge Instructions (Signed)
Continue with your medications as prescribed.  However, I would like for you to use your albuterol inhaler every 4 hours regardless of your symptoms for the next 2 days as we discussed.  Follow-up with your primary care for continued management of symptoms, as well as your pulmonology appointment that you have scheduled for next week.  Return if worsening shortness of breath.

## 2021-03-15 NOTE — ED Provider Notes (Signed)
[facility] EMERGENCY DEPARTMENT Provider Note   CSN: [id number] Arrival date & time: 03/15/21  1437     History Chief Complaint  Patient presents with   Chest Pain    [name] is a 65 y.o. female.  Pt with extensive history of asthma and diabetes presents today with shortness of breath.  She states that symptoms have been significantly worse since she had COVID last March.  Also endorses that changes in weather are a typical trigger for her.  She was given a 5-day dose of prednisone 2 days ago and has also been receiving Xolair injections with second dose 5 days ago.  She also endorses cough with chest pain.  She states that chest pain is sharp, substernal, and pleuritic in nature and does not radiate.  Cough is productive of clear mucus.  She takes Symbicort, montelukast, and albuterol daily for management of her symptoms.  Has not been needing her rescue albuterol more than normal over the past few days.  Denies fevers, chills, nausea, vomiting.  The history is provided by the patient. No language interpreter was used.  Chest Pain Associated symptoms: cough   Associated symptoms: no abdominal pain, no diaphoresis, no dizziness, no dysphagia, no fatigue, no fever, no headache, no nausea, no numbness, no palpitations, no shortness of breath, no vomiting and no weakness       Past Medical History:  Diagnosis Date   Anemia 2006   required transfusion post TAH/BSO 05/2005   Arthritis    Asthma    Chronic cough    Colon polyps    hyperplastic   Diabetes mellitus without complication (HCC)    Diverticulosis    GERD (gastroesophageal reflux disease)    on prilosec   GLA deficiency ([place])    Glaucoma of both eyes    Hypertension    Joint pain    Osteoarthritis    Pancreatitis    Shortness of breath dyspnea    Vitamin D deficiency     Patient Active Problem List   Diagnosis Date Noted   Asthma exacerbation 09/19/2020   Anemia 10/13/2019   Glaucoma  10/13/2019   Hypertensive disorder 10/13/2019   Uterine leiomyoma 10/13/2019   Vitamin D insufficiency 04/26/2019   TIA (transient ischemic attack) 03/30/2019   Hyperlipidemia 03/30/2019   Thyroid nodule 03/30/2019   Stroke-like episode s/p IV tPA 03/28/2019   GERD (gastroesophageal reflux disease) 06/19/2018   Constipation 06/19/2018   Other fatigue 08/02/2017   Shortness of breath on exertion 08/02/2017   Mass of axilla 07/07/2017   Abdominal pain, epigastric    Abnormal CT of the abdomen    Pancreatitis, acute    Acute pancreatitis 08/13/2015   UTI (lower urinary tract infection) 08/13/2015   Severe obesity (BMI >= 40) ([place]) 01/23/2015   Sinusitis, chronic 01/08/2015   Cough variant asthma 01/03/2015   Arthritis of right hip 12/12/2013   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   Benign neoplasm of colon 08/22/2013   Special screening for malignant neoplasms, colon 08/22/2013   Abdominal pain 08/19/2013   Diabetes mellitus without complication ([place]) 32/20/2542   Essential hypertension, benign 08/19/2013   Ventral hernia 08/19/2013    Past Surgical History:  Procedure Laterality Date   ABDOMINAL HYSTERECTOMY     BREAST EXCISIONAL BIOPSY Right 1990   COLONOSCOPY N/A 08/22/2013   Procedure: COLONOSCOPY;  Surgeon: Irene Shipper, MD;  Location: [place];  Service: Endoscopy;  Laterality: N/A;   ESOPHAGOGASTRODUODENOSCOPY N/A 08/22/2013  Procedure: ESOPHAGOGASTRODUODENOSCOPY (EGD);  Surgeon: Irene Shipper, MD;  Location: [facility] ENDOSCOPY;  Service: Endoscopy;  Laterality: N/A;   ESOPHAGOGASTRODUODENOSCOPY (EGD) WITH PROPOFOL N/A 08/19/2015   Procedure: ESOPHAGOGASTRODUODENOSCOPY (EGD) WITH PROPOFOL;  Surgeon: Irene Shipper, MD;  Location: [facility] ENDOSCOPY;  Service: Endoscopy;  Laterality: N/A;   HERNIA REPAIR     HYSTEROSCOPY WITH D & C  01/2005   for uterine fibroids.    INSERTION OF MESH N/A 08/24/2013   Procedure: INSERTION OF MESH;  Surgeon: Edward Jolly, MD;   Location: [place];  Service: General;  Laterality: N/A;   JOINT REPLACEMENT Right    LIPOMA EXCISION Left 06/21/2015   Procedure: EXCISION OF LEFT SCALP LIPOMA;  Surgeon: Johnathan Hausen, MD;  Location: [place];  Service: General;  Laterality: Left;   PANNICULECTOMY N/A 08/24/2013   Procedure: PANNICULECTOMY;  Surgeon: Edward Jolly, MD;  Location: [place];  Service: General;  Laterality: N/A;   TOTAL ABDOMINAL HYSTERECTOMY W/ BILATERAL SALPINGOOPHORECTOMY  05/2005   TOTAL HIP ARTHROPLASTY Right 12/12/2013   Procedure: RIGHT TOTAL HIP ARTHROPLASTY ANTERIOR APPROACH;  Surgeon: Mcarthur Rossetti, MD;  Location: [place];  Service: Orthopedics;  Laterality: Right;   VENTRAL HERNIA REPAIR N/A 08/24/2013   Procedure: HERNIA REPAIR VENTRAL ADULT;  Surgeon: Edward Jolly, MD;  Location: [facility] OR;  Service: General;  Laterality: N/A;     OB History     Gravida  5   Para      Term      Preterm      AB      Living  5      SAB      IAB      Ectopic      Multiple      Live Births              Family History  Problem Relation Age of Onset   Emphysema Mother        smoked   Diabetes Mother    Hypertension Mother    Colon cancer Father        7-s   Hypertension Father    Heart disease Father    High Cholesterol Father    Breast cancer Sister     Social History   Tobacco Use   Smoking status: Former    Packs/day: 0.25    Years: 15.00    Pack years: 3.75    Types: Cigarettes    Quit date: 06/22/1978    Years since quitting: 42.7   Smokeless tobacco: Never  Vaping Use   Vaping Use: Never used  Substance Use Topics   Alcohol use: No    Alcohol/week: 0.0 standard drinks   Drug use: No    Home Medications Prior to Admission medications   Medication Sig Start Date End Date Taking? Authorizing Provider  albuterol (PROVENTIL) (2.5 MG/3ML) 0.083% nebulizer solution Take 3 mLs (2.5 mg total) by nebulization every 6 (six) hours as needed for  wheezing or shortness of breath. 06/29/19   Jaynee Eagles, PA-C  aspirin EC 81 MG EC tablet Take 1 tablet (81 mg total) by mouth daily. 03/31/19   Donzetta Starch, NP  atorvastatin (LIPITOR) 20 MG tablet Take 1 tablet (20 mg total) by mouth daily at 6 PM. 03/30/19   Donzetta Starch, NP  budesonide-formoterol (SYMBICORT) 160-4.5 MCG/ACT inhaler Inhale 2 puffs into the lungs in the morning and at bedtime. 02/25/21   Hunsucker, Bonna Gains, MD  cetirizine (  ZYRTEC ALLERGY) 10 MG tablet Take 1 tablet (10 mg total) by mouth daily. 08/31/20   Hazel Sams, PA-C  chlorpheniramine (CHLOR-TRIMETON) 4 MG tablet Take 1 tablet (4 mg total) by mouth every 6 (six) hours as needed for allergies. 09/18/20   Martyn Ehrich, NP  EPINEPHrine 0.3 mg/0.3 mL IJ SOAJ injection Inject 0.3 mg into the muscle as needed for anaphylaxis. 01/29/21   Hunsucker, Bonna Gains, MD  fluticasone (FLONASE) 50 MCG/ACT nasal spray Place 1 spray into both nostrils daily. 08/31/20   Marin Roberts E, PA-C  glucose blood test strip Use as instructed 08/25/17   Dennard Nip D, MD  MELATONIN PO Take 1 tablet by mouth at bedtime.    [provider]  metFORMIN (GLUCOPHAGE) 1000 MG tablet Take 1,000 mg by mouth 2 (two) times daily with a meal.    [provider]  montelukast (SINGULAIR) 10 MG tablet Take 1 tablet (10 mg total) by mouth at bedtime. 04/17/20   Hunsucker, Bonna Gains, MD  omalizumab Arvid Right) 150 MG/ML prefilled syringe INJECT 2 SYRINGES UNDER THE SKIN EVERY 2 WEEKS. TOTAL DOSE =375MG  03/13/21   Hunsucker, Bonna Gains, MD  omalizumab Arvid Right) 75 MG/0.5ML prefilled syringe INJECT 1 SYRINGE UNDER THE SKIN EVERY 2 WEEKS. TOTAL DOSE OF 375 MG EVERY 14 DAYS. 03/13/21   Hunsucker, Bonna Gains, MD  omeprazole (PRILOSEC) 20 MG capsule Take 20 mg by mouth daily.    [provider]  pioglitazone (ACTOS) 15 MG tablet Take 15 mg by mouth daily. 09/06/19   [provider]  Travoprost, BAK Free, (TRAVATAN) 0.004 % SOLN ophthalmic  solution  08/11/19   [provider]    Allergies    Naproxen, Diclofenac, and Penicillins  Review of Systems   Review of Systems  Constitutional:  Negative for chills, diaphoresis, fatigue and fever.  HENT:  Negative for congestion, postnasal drip, rhinorrhea, sore throat, trouble swallowing and voice change.   Respiratory:  Positive for cough, chest tightness and wheezing. Negative for apnea, choking, shortness of breath and stridor.   Cardiovascular:  Positive for chest pain. Negative for palpitations and leg swelling.  Gastrointestinal:  Negative for abdominal distention, abdominal pain, constipation, diarrhea, nausea and vomiting.  Skin:  Negative for color change and pallor.  Neurological:  Negative for dizziness, tremors, seizures, syncope, facial asymmetry, speech difficulty, weakness, light-headedness, numbness and headaches.  Psychiatric/Behavioral:  Negative for confusion and decreased concentration.   All other systems reviewed and are negative.  Physical Exam Updated Vital Signs BP (!) 147/72 (BP Location: Left Arm)   Pulse 63   Temp 98.4 F (36.9 C) (Oral)   Resp 18   Ht 5\' 2"  (1.575 m)   Wt 89.4 kg   SpO2 98%   BMI 36.03 kg/m   Physical Exam Vitals and nursing note reviewed.  Constitutional:      General: She is not in acute distress.    Appearance: Normal appearance. She is well-developed and normal weight. She is not ill-appearing, toxic-appearing or diaphoretic.  HENT:     Head: Normocephalic and atraumatic.  Eyes:     General: No scleral icterus.    Conjunctiva/sclera: Conjunctivae normal.  Cardiovascular:     Rate and Rhythm: Normal rate and regular rhythm.     Heart sounds: Normal heart sounds.  Pulmonary:     Effort: Pulmonary effort is normal. No tachypnea, accessory muscle usage, prolonged expiration or respiratory distress.     Breath sounds: No stridor. Wheezing present. No decreased breath sounds,  rhonchi or rales.     Comments:  Wheezing noted throughout bilateral upper and lower lung fields, improvement noted following neb treatment.  Patient ambulatory without any distress or drop in oxygen saturation or tachycardia. Chest:     Chest wall: No mass or tenderness.  Abdominal:     General: Abdomen is flat. Bowel sounds are normal.     Palpations: Abdomen is soft.  Musculoskeletal:        General: Normal range of motion.     Cervical back: Normal range of motion.     Right lower leg: No tenderness. No edema.     Left lower leg: No tenderness. No edema.  Skin:    General: Skin is warm and dry.  Neurological:     General: No focal deficit present.     Mental Status: She is alert.  Psychiatric:        Mood and Affect: Mood normal.        Behavior: Behavior normal.    ED Results / Procedures / Treatments   Labs (all labs ordered are listed, but only abnormal results are displayed) Labs Reviewed  BASIC METABOLIC PANEL - Abnormal; Notable for the following components:      Result Value   CO2 21 (*)    Glucose, Bld 493 (*)    Creatinine, Ser 1.06 (*)    GFR, Estimated 59 (*)    All other components within normal limits  CBC WITH DIFFERENTIAL/PLATELET - Abnormal; Notable for the following components:   Lymphs Abs 0.4 (*)    All other components within normal limits  TROPONIN I (HIGH SENSITIVITY)  TROPONIN I (HIGH SENSITIVITY)    EKG None  Radiology DG Chest 2 View  Result Date: 03/15/2021 CLINICAL DATA:  cp sob EXAM: CHEST - 2 VIEW COMPARISON:  September 18, 2020 FINDINGS: The cardiomediastinal silhouette is unchanged in contour. No pleural effusion. No pneumothorax. No acute pleuroparenchymal abnormality. Visualized abdomen is unremarkable. IMPRESSION: No acute cardiopulmonary abnormality. Electronically Signed   By: Valentino Saxon M.D.   On: 03/15/2021 15:49    Procedures Procedures   Medications Ordered in ED Medications  ipratropium-albuterol (DUONEB) 0.5-2.5 (3) MG/3ML nebulizer solution 3 mL  (3 mLs Nebulization Given 03/15/21 2237)    ED Course  I have reviewed the triage vital signs and the nursing notes.  Pertinent labs & imaging results that were available during my care of the patient were reviewed by me and considered in my medical decision making (see chart for details).    MDM Rules/Calculators/A&P                         Mervin Kung presents to the ED with shortness of breath and chest pain.  Chest pain is not likely of cardiac etiology d/t presentation, VSS, no tracheal deviation, no JVD or new murmur, RRR, EKG without acute abnormalities, negative troponin, and negative CXR.  Pain is pleuritic in nature, sharp, not exertional, and does not radiate.  Suspect that chest pain is associated with these associated with patient's asthma, she is in agreement with this.  Case has been discussed with Dr. Rogene Houston who agrees with the above plan to discharge.   The presentation of [name] is not consistent with cardiac wheeze, congestive heart failure, pneumothorax, pulmonary emboli, or other emergent process.  Additionally, Mervin Kung has no evidence of of pneumonia, sepsis, or other indication for antibiotics.  Patient has a longstanding history of asthma,  which has been worse since she had COVID in March.  She is seeing closely with [facility] pulmonology for management.  Currently, she is on prednisone, Symbicort, montelukast, and albuterol as a rescue inhaler for management of her symptoms.  She is also receiving Xolair injections.  She has an appointment with her pulmonologist next week.  Upon discharge, JENNEA RAGER has no evidence of respiratory failure or signs of tiring, and is comfortable without respiratory distress. Oxygen saturations remain above 90%.  She was able to ambulate in the hallway without significant drop in oxygen saturation or any distress.  Lung sounds improved with DuoNeb treatment.  I feel at this time that Mervin Kung meets  outpatient treatment criteria.  I will recommend that patient switches to scheduled albuterol treatments for 2 days to hopefully better manage her symptoms until she can be seen by her pulmonologist next week, as well as continuing her course of prednisone.  She is in agreement with this plan.  Data Reviewed/Counseling: I have reviewed the patient's vital signs, nursing notes, and other relevant tests/information. I had a detailed discussion regarding the historical points, exam findings, and any diagnostic results supporting the discharge diagnosis. I also discussed the need for outpatient follow-up and the need to return to the ED if symptoms worsen or if there are any questions or concerns that arise at home.   Additionally, pt has been advised to return to the ED if CP becomes exertional, associated with diaphoresis or nausea, radiates to left jaw/arm, worsens or becomes concerning in any way. Pt appears reliable for follow up and is agreeable to discharge.     Final Clinical Impression(s) / ED Diagnoses Final diagnoses:  Persistent asthma without complication, unspecified asthma severity    Rx / DC Orders ED Discharge Orders     None     An After Visit Summary was printed and given to the patient.    Nestor Lewandowsky 03/16/21 0050    Fredia Sorrow, MD 03/24/21 1316

## 2021-03-15 NOTE — ED Provider Notes (Signed)
Emergency Medicine Provider Triage Evaluation Note  [name] , a 65 y.o. female  was evaluated in triage.  Pt complains of wheezing.  Reports associated shortness of breath.  Symptoms have been going on for 4 days.  Has been on steroids for the past few days for her asthma.  Is also on Xolair, received her second dose a few days ago.  Started having chest pain today.  Has also been needing to give herself breathing treatments which is unusual.  Review of Systems  Positive: Chest pain, wheezing, shortness of breath Negative: Fever  Physical Exam  There were no vitals taken for this visit. Gen:   Awake, no distress   Resp:  Normal effort  MSK:   Moves extremities without difficulty  Other:  Wheezing to bilateral lung fields without signs of respiratory distress  Medical Decision Making  Medically screening exam initiated at 3:18 PM.  Appropriate orders placed.  Mervin Kung was informed that the remainder of the evaluation will be completed by another provider, this initial triage assessment does not replace that evaluation, and the importance of remaining in the ED until their evaluation is complete.  Work-up initiated   Delia Heady, PA-C 03/15/21 1519    Fredia Sorrow, MD 03/24/21 1314

## 2021-03-17 NOTE — Telephone Encounter (Signed)
Will complete the PA for the symbicort.

## 2021-03-25 ENCOUNTER — Telehealth: Payer: Self-pay | Admitting: Pharmacist

## 2021-03-25 ENCOUNTER — Other Ambulatory Visit: Payer: BC Managed Care – PPO | Admitting: Pharmacist

## 2021-03-25 NOTE — Progress Notes (Deleted)
HPI [name] presents today to [facility] to see pharmacy team for third dose of Xolair. First dose received on 02/25/21 with 2 hour monitoring period. She received second dose of Xolair on 03/11/21 with 30 minute monitoring period. Past medical history includes cough variant asthma which was further exacerbated by COVID infection. She reports that since she started Xolair she has been taking Symbicort as prescribed. States that she continues to have wheezing. We reviewed that it can several months to assess effectiveness of asthma biologics.  Epipen on hand and in date: Yes  Interim History: Patient had ED visit on 03/15/21 d/t asthma exacerbation - presented with shortness of breath, sharp and non-radiating chest pain. She received 5-day prednisone course on 03/12/21. Recommendation to switch to scheduled albuterol treatment x 2 days upon ED discharge.  Respiratory Medications Current regimen: Symbicort 160/4.5 mcg (2 puffs twice daily), montelukast 10mg  nightly Patient reports no known adherence challenges  OBJECTIVE Allergies  Allergen Reactions   Naproxen     "hives, I had to call 911"    Diclofenac     Hives    Penicillins Hives    Has patient had a PCN reaction causing immediate rash, facial/tongue/throat swelling, SOB or lightheadedness with hypotension: Yes Has patient had a PCN reaction causing severe rash involving mucus membranes or skin necrosis: Yes Has patient had a PCN reaction that required hospitalization No Has patient had a PCN reaction occurring within the last 10 years: No. If all of the above answers are "NO", then may proceed with Cephalosporin use.     Outpatient Encounter Medications as of 03/25/2021  Medication Sig   albuterol (PROVENTIL) (2.5 MG/3ML) 0.083% nebulizer solution Take 3 mLs (2.5 mg total) by nebulization every 6 (six) hours as needed for wheezing or shortness of breath.   aspirin EC 81 MG EC tablet Take 1 tablet (81 mg total) by mouth  daily.   atorvastatin (LIPITOR) 20 MG tablet Take 1 tablet (20 mg total) by mouth daily at 6 PM.   budesonide-formoterol (SYMBICORT) 160-4.5 MCG/ACT inhaler Inhale 2 puffs into the lungs in the morning and at bedtime.   cetirizine (ZYRTEC ALLERGY) 10 MG tablet Take 1 tablet (10 mg total) by mouth daily.   chlorpheniramine (CHLOR-TRIMETON) 4 MG tablet Take 1 tablet (4 mg total) by mouth every 6 (six) hours as needed for allergies.   EPINEPHrine 0.3 mg/0.3 mL IJ SOAJ injection Inject 0.3 mg into the muscle as needed for anaphylaxis.   fluticasone (FLONASE) 50 MCG/ACT nasal spray Place 1 spray into both nostrils daily.   glucose blood test strip Use as instructed   MELATONIN PO Take 1 tablet by mouth at bedtime.   metFORMIN (GLUCOPHAGE) 1000 MG tablet Take 1,000 mg by mouth 2 (two) times daily with a meal.   montelukast (SINGULAIR) 10 MG tablet Take 1 tablet (10 mg total) by mouth at bedtime.   omalizumab (XOLAIR) 150 MG/ML prefilled syringe INJECT 2 SYRINGES UNDER THE SKIN EVERY 2 WEEKS. TOTAL DOSE =375MG    omalizumab (XOLAIR) 75 MG/0.5ML prefilled syringe INJECT 1 SYRINGE UNDER THE SKIN EVERY 2 WEEKS. TOTAL DOSE OF 375 MG EVERY 14 DAYS.   omeprazole (PRILOSEC) 20 MG capsule Take 20 mg by mouth daily.   pioglitazone (ACTOS) 15 MG tablet Take 15 mg by mouth daily.   Travoprost, BAK Free, (TRAVATAN) 0.004 % SOLN ophthalmic solution    No facility-administered encounter medications on file as of 03/25/2021.     Immunization History  Administered Date(s) Administered  Influenza Split 03/22/2014   Influenza,inj,quad, With Preservative 04/23/2019, 03/22/2020   PFIZER(Purple Top)SARS-COV-2 Vaccination 08/19/2019, 09/09/2019    PFTs No flowsheet data found.   Eosinophils Most recent blood eosinophil count was 400 cells/microL taken on 02/25/21.    IgE: 967 on 10/30/20   Pre-treatment weight: 87.2 kg  Assessment   Biologics training for omalizumab (Xolair)  Reports no side effects after  first dose and since first dose on 02/25/21  Dose: 375mg  every 2 weeks based on pre-treatment IgE of 967 and weight of 87kg. Patient advised that each dose will be 2 purple and 1 blue pre-filled syringe.  Received medication from [facility] for 4 week supply. Patient self-administered in right thigh, left thigh, and right abdomen with: Xolair 150mg /ml x 2 pre-filled syringe NDC: [address]-01 Lot: [id number] Exp: 11/2021   Xolair 75mg /0.5 mL x 1 pre-filled syringe NDC: [address]-67 Lot: [id number] Exp: 10/2021   Patient monitored for 30 minutes. No issues or concerns.  Medication Reconciliation  A drug regimen assessment was performed, including review of allergies, interactions, disease-state management, dosing and immunization history. Medications were reviewed with the patient, including name, instructions, indication, goals of therapy, potential side effects, importance of adherence, and safe use.  Drug interaction(s): none noted  Immunizations  Patient is indicated for the influenzae, pneumonia, and shingles vaccinations. Patient has received 2 COVID19 vaccines.  PLAN She will continue Xolair 375mg  every 2 weeks. Next dose is due on 04/08/21 and every 14 days thereafter. Patient advised to keep Epipen on hand. Continue maintenance asthma regimen of: Symbicort 160/4.5 mcg (2 puffs twice daily), montelukast 10mg  nightly. Rx for Symbicort sent to [facility] per patient request.  F/u with Dr. Silas Flood on 04/15/21 or sooner with APP if any significant concerns  All questions encouraged and answered.  Instructed patient to reach out with any further questions or concerns.  Thank you for allowing pharmacy to participate in this patient's care.  This appointment required 60 minutes of patient care (this includes precharting, chart review, review of results, face-to-face care, etc.).  Knox Saliva, PharmD, MPH, BCPS Clinical Pharmacist (Rheumatology and Pulmonology)

## 2021-03-25 NOTE — Telephone Encounter (Signed)
Patient was no-show to appointment for third Xolair injection. Left VM for patient requesting return call to r/s appointment  Knox Saliva, PharmD, MPH, BCPS Clinical Pharmacist (Rheumatology and Pulmonology)

## 2021-03-26 NOTE — Telephone Encounter (Signed)
Returned call to patient. Unable to reach to schedule Xolair injection. Advised her to return call to front desk and r/s appointment with staff on phone and that she does not need to speak with me directly to schedule appt  Knox Saliva, PharmD, MPH, BCPS Clinical Pharmacist (Rheumatology and Pulmonology)

## 2021-03-28 ENCOUNTER — Telehealth: Payer: Self-pay | Admitting: Pulmonary Disease

## 2021-03-31 ENCOUNTER — Ambulatory Visit: Payer: BC Managed Care – PPO | Admitting: Pharmacist

## 2021-03-31 ENCOUNTER — Other Ambulatory Visit: Payer: Self-pay

## 2021-03-31 DIAGNOSIS — Z7189 Other specified counseling: Secondary | ICD-10-CM

## 2021-03-31 DIAGNOSIS — Z79899 Other long term (current) drug therapy: Secondary | ICD-10-CM

## 2021-03-31 DIAGNOSIS — J4530 Mild persistent asthma, uncomplicated: Secondary | ICD-10-CM

## 2021-03-31 NOTE — Progress Notes (Signed)
HPI [name] presents today to [facility] to see pharmacy team for third dose of Xolair. First dose received on 02/25/21 with 2 hour monitoring period. Second dose received on 03/11/21 with 30 minute monitoring period  Past medical history includes cough variant asthma which was further exacerbated by COVID infection. She reports that since she started Xolair she has been taking Symbicort as prescribed. States that she continues to have wheezing. We reviewed that it can several months to assess effectiveness of asthma biologics.  Epipen on hand and in date: Yes  Interim history - patient had one urgent care visit and subsequent ED visit for asthma/SOB. She received a 5-day prednisone taper.   Respiratory Medications Current regimen: Symbicort 160/4.5 mcg (2 puffs twice daily), montelukast 10mg  nightly Patient reports no known adherence challenges  OBJECTIVE Allergies  Allergen Reactions   Naproxen     "hives, I had to call 911"    Diclofenac     Hives    Penicillins Hives    Has patient had a PCN reaction causing immediate rash, facial/tongue/throat swelling, SOB or lightheadedness with hypotension: Yes Has patient had a PCN reaction causing severe rash involving mucus membranes or skin necrosis: Yes Has patient had a PCN reaction that required hospitalization No Has patient had a PCN reaction occurring within the last 10 years: No. If all of the above answers are "NO", then may proceed with Cephalosporin use.     Outpatient Encounter Medications as of 03/31/2021  Medication Sig   albuterol (PROVENTIL) (2.5 MG/3ML) 0.083% nebulizer solution Take 3 mLs (2.5 mg total) by nebulization every 6 (six) hours as needed for wheezing or shortness of breath.   aspirin EC 81 MG EC tablet Take 1 tablet (81 mg total) by mouth daily.   atorvastatin (LIPITOR) 20 MG tablet Take 1 tablet (20 mg total) by mouth daily at 6 PM.   budesonide-formoterol (SYMBICORT) 160-4.5 MCG/ACT inhaler Inhale  2 puffs into the lungs in the morning and at bedtime.   cetirizine (ZYRTEC ALLERGY) 10 MG tablet Take 1 tablet (10 mg total) by mouth daily.   chlorpheniramine (CHLOR-TRIMETON) 4 MG tablet Take 1 tablet (4 mg total) by mouth every 6 (six) hours as needed for allergies.   EPINEPHrine 0.3 mg/0.3 mL IJ SOAJ injection Inject 0.3 mg into the muscle as needed for anaphylaxis.   fluticasone (FLONASE) 50 MCG/ACT nasal spray Place 1 spray into both nostrils daily.   glucose blood test strip Use as instructed   MELATONIN PO Take 1 tablet by mouth at bedtime.   metFORMIN (GLUCOPHAGE) 1000 MG tablet Take 1,000 mg by mouth 2 (two) times daily with a meal.   montelukast (SINGULAIR) 10 MG tablet Take 1 tablet (10 mg total) by mouth at bedtime.   omalizumab (XOLAIR) 150 MG/ML prefilled syringe INJECT 2 SYRINGES UNDER THE SKIN EVERY 2 WEEKS. TOTAL DOSE =375MG    omalizumab (XOLAIR) 75 MG/0.5ML prefilled syringe INJECT 1 SYRINGE UNDER THE SKIN EVERY 2 WEEKS. TOTAL DOSE OF 375 MG EVERY 14 DAYS.   omeprazole (PRILOSEC) 20 MG capsule Take 20 mg by mouth daily.   pioglitazone (ACTOS) 15 MG tablet Take 15 mg by mouth daily.   Travoprost, BAK Free, (TRAVATAN) 0.004 % SOLN ophthalmic solution    No facility-administered encounter medications on file as of 03/31/2021.     Immunization History  Administered Date(s) Administered   Influenza Split 03/22/2014   Influenza,inj,quad, With Preservative 04/23/2019, 03/22/2020   PFIZER(Purple Top)SARS-COV-2 Vaccination 08/19/2019, 09/09/2019    PFTs  No flowsheet data found.   Eosinophils Most recent blood eosinophil count was 400 cells/microL taken on 02/25/21.    IgE: 967 on 10/30/20   Pre-treatment weight: 87.2 kg  Assessment   Biologics training for omalizumab (Xolair)  Reports no side effects after first dose and since first dose on 02/25/21  Dose: 375mg  every 2 weeks based on pre-treatment IgE of 967 and weight of 87kg. Patient advised that each dose will be 2  purple and 1 blue pre-filled syringe.  Received medication from [facility] for 4 week supply. Patient self-administered in right thigh, left thigh, and right abdomen with: Xolair 150mg /ml x 2 pre-filled syringe NDC: [address]-01 Lot: [id number] Exp: 11/2021   Xolair 75mg /0.5 mL x 1 pre-filled syringe NDC: [address]-56 Lot: [id number] Exp: 10/2021   Patient monitored for 30 minutes. No issues or concerns.  Medication Reconciliation  A drug regimen assessment was performed, including review of allergies, interactions, disease-state management, dosing and immunization history. Medications were reviewed with the patient, including name, instructions, indication, goals of therapy, potential side effects, importance of adherence, and safe use.  Drug interaction(s): none noted  Immunizations  Patient is indicated for the influenzae, pneumonia, and shingles vaccinations. Patient has received 2 COVID19 vaccines.  PLAN Third Xolair dose will be administered in clinic with 30 minute monitoring period. Medication on hand that was delivered from [facility]. Scheduled for 03/25/21. Patient advised that Epipen must be on hand at this visit.  Continue maintenance asthma regimen of: Symbicort 160/4.5 mcg (2 puffs twice daily), montelukast 10mg  nightly. Rx for Symbicort sent to [facility] per patient request.   All questions encouraged and answered.  Instructed patient to reach out with any further questions or concerns.  Thank you for allowing pharmacy to participate in this patient's care.  This appointment required 60 minutes of patient care (this includes precharting, chart review, review of results, face-to-face care, etc.).  Knox Saliva, PharmD, MPH, BCPS Clinical Pharmacist (Rheumatology and Pulmonology)

## 2021-03-31 NOTE — Telephone Encounter (Signed)
Patient scheduled for Xolair 3rd dose today, 03/31/21

## 2021-03-31 NOTE — Patient Instructions (Signed)
Your next Xolair dose is due on 10/24, 11/7, and every 14 days thereafter  CONTINUE Symbicort 160/4.5 mcg (2 puffs twice daily), montelukast 10mg  nightly  Your prescription will be shipped from [facility]. Their phone number is [phone]    Please call to schedule shipment and confirm address. They will mail your medication to your home.  Your copay should be affordable. If you call the pharmacy and it is not affordable, please double-check that they are billing through your copay card as secondary coverage. That copay card information is: ID: [id number] Group Number: [id number] BIN: [id number] PCN: [id number]  You will need to be seen by your provider in 3 to 4 months to assess how Xolair is working for you. Please ensure you have a follow-up appointment scheduled in January/February 2023 with Dr. Silas Flood.  Call our clinic if you need to make this appointment.  How to manage an injection site reaction: Remember the 5 C's: COUNTER - leave on the counter at least 30 minutes but up to overnight to bring medication to room temperature. This may help prevent stinging COLD - place something cold (like an ice gel pack or cold water bottle) on the injection site just before cleansing with alcohol. This may help reduce pain CLARITIN - use Claritin (generic name is loratadine) for the first two weeks of treatment or the day of, the day before, and the day after injecting. This will help to minimize injection site reactions CORTISONE CREAM - apply if injection site is irritated and itching CALL ME - if injection site reaction is bigger than the size of your fist, looks infected, blisters, or if you develop hives  Anaphylactic Reactions  Anaphylactic reactions can occur up to 24 hours after administration.  What are the signs and symptoms of anaphylaxis?  Symptoms can vary from a mild skin reaction to more severe reactions including: Wheezing, shortness of breath, cough, chest tightness or  trouble breathing Low blood pressure, dizziness, fainting, rapid of weak heartbeat, anxiety, or feeling of "impending doom" Flushing, itching, hives, or feeling warm Swelling of the throat or tongue, throat tightness, hoarse voice, or trouble swallowing  Some of these symptoms require immediate treatment, as they can be life threatening.  If you experience severe symptoms which are bolded above use your Epipen and call 911 for immediate emergency care.

## 2021-04-09 NOTE — Telephone Encounter (Signed)
Encounter opened in error

## 2021-04-15 ENCOUNTER — Ambulatory Visit (INDEPENDENT_AMBULATORY_CARE_PROVIDER_SITE_OTHER): Payer: BC Managed Care – PPO | Admitting: Pulmonary Disease

## 2021-04-15 ENCOUNTER — Encounter: Payer: Self-pay | Admitting: Pulmonary Disease

## 2021-04-15 ENCOUNTER — Other Ambulatory Visit: Payer: Self-pay

## 2021-04-15 VITALS — BP 118/78 | HR 69 | Ht 62.0 in | Wt 197.2 lb

## 2021-04-15 DIAGNOSIS — J45991 Cough variant asthma: Secondary | ICD-10-CM

## 2021-04-15 DIAGNOSIS — R0602 Shortness of breath: Secondary | ICD-10-CM | POA: Diagnosis not present

## 2021-04-15 NOTE — Patient Instructions (Addendum)
Nice to see you again  I am optimistic that we are seeing some early success with being Xolair.  I am sorry it took a little bit of time to kick in.  I will plan to keep it going through the new year and then touching base to see how things are going.  If at any point time you want to discuss anything about your treatment plan or no longer comfortable with the treatment plan, please let me know.  Return to clinic in 3 months or sooner as needed with Dr. Silas Flood

## 2021-04-16 NOTE — Progress Notes (Signed)
@Patient  ID: [name], female    DOB: 1955/11/20, 65 y.o.   MRN: [id number]  Chief Complaint  Patient presents with   Follow-up    Patient states asthma is somewhat controlled    Referring provider: Antony Contras, MD  HPI:   65 y.o.woman who previously followed in pulmonary clinic with Dr. Melvyn Novas with asthma here for follow up of DOE felt to be poorly controlled asthma exacerbated by COVID infection.  ED note 02/2021 reviewed.  Multiple pharmacy notes from our clinic reviewed.  In the interim since last visit has started Xolair.  Fourth injection given yesterday.  First 1 at home.  A bit difficult, struggled with the multiple syringes given the high dose related to her IgE level.  Unfortunate, went to the ED with asthma exacerbation a couple weeks after first dose, day 2 after second dose.  Improved with prednisone.  Since then, symptoms seem to be improving.  Hard to tease out.  However cough seems totally gone.  Dyspnea seems to be improving.  This is reassuring.  Discussed at length side effect profile.  She seems hesitant to continue given read about side effects.  Discussed additional time trial given improving symptoms.  If at any point in time she is uncomfortable continuing Xolair therapy we can stop.  Encourage that we are seeing improved symptoms after only about 8 weeks of treatment.  HPI at initial visit: Patient has been diagnosed with asthma for years.  Usually relatively well controlled.  Uses Symbicort mid dose.  About once or twice a years with changes states since, spring and fall she has worsening breathing.  This occurred earlier in the month.  All of a sudden she had significant chest tightness, wheeze, shortness of breath.  Associated with hacking cough.  Dry.  Went to urgent care and received a dose of IV steroids.  Her symptoms have improved a good deal but still has lingering cough and mild chest tightness, shortness of breath.  No fever or chills.  No sick contacts.   Denies any viral symptoms such as sore throat, runny nose, fever at time of onset of symptoms.  When asked, she does think maybe she had a little more nasal congestion on the days preceding this acute change.  Chest x-ray 03/04/2020 reviewed and interpreted as clear lungs with mild hyperinflation with increased AP diameter on lateral film.  Prior labs reviewed which are notable for elevated IgE greater than 1200 and eosinophils as high as 800 in the past.  PMH: Asthma, diabetes, history of pancreatitis Surgical history: Hysterectomy Family history: Reviewed, denies significant family history of respiratory illness Social history: Former smoker, 4-pack-year smoking history quit 40 years ago, grew up in [place], Education officer, museum to [employer], raising 4 grandchildren all girls   Questionaires / Pulmonary Flowsheets:   ACT:  Asthma Control Test ACT Total Score  09/18/2020 11    MMRC: No flowsheet data found.  Epworth:  No flowsheet data found.  Tests:   FENO:  No results found for: NITRICOXIDE  PFT: No flowsheet data found.  WALK:  No flowsheet data found.  Imaging: Personally reviewed, per EMR and discussion in this note  Lab Results: Personally reviewed, eosinophils elevated up to 800 in the past, IgE greater than 1200 CBC    Component Value Date/Time   WBC 7.0 03/15/2021 1539   RBC 4.06 03/15/2021 1539   HGB 12.1 03/15/2021 1539   HGB 13.4 08/02/2017 1035   HCT 38.1 03/15/2021 1539  HCT 40.7 08/02/2017 1035   PLT 269 03/15/2021 1539   MCV 93.8 03/15/2021 1539   MCV 86 08/02/2017 1035   MCH 29.8 03/15/2021 1539   MCHC 31.8 03/15/2021 1539   RDW 14.2 03/15/2021 1539   RDW 14.9 08/02/2017 1035   LYMPHSABS 0.4 (L) 03/15/2021 1539   LYMPHSABS 2.2 08/02/2017 1035   MONOABS 0.3 03/15/2021 1539   EOSABS 0.0 03/15/2021 1539   EOSABS 0.2 08/02/2017 1035   BASOSABS 0.0 03/15/2021 1539   BASOSABS 0.0 08/02/2017 1035    BMET    Component Value Date/Time    NA 138 03/15/2021 1539   NA 141 08/02/2017 1035   K 3.7 03/15/2021 1539   CL 106 03/15/2021 1539   CO2 21 (L) 03/15/2021 1539   GLUCOSE 493 (H) 03/15/2021 1539   BUN 13 03/15/2021 1539   BUN 14 08/02/2017 1035   CREATININE 1.06 (H) 03/15/2021 1539   CALCIUM 9.3 03/15/2021 1539   GFRNONAA 59 (L) 03/15/2021 1539   GFRAA >60 03/28/2019 1326    BNP No results found for: BNP  ProBNP No results found for: PROBNP  Specialty Problems       Pulmonary Problems   Cough variant asthma    Followed in Pulmonary clinic/ [facility]/ [facility]   - allergy profile 01/03/2015 > 0.8, IgE 1256 cat > dog  Dust/ mold - sinus ct > Pos > see sinusitis - 01/17/2015 p extensive coaching HFA effectiveness =   75% > continue symbicort 80 2bid       Sinusitis, chronic    Sinus CT 01/08/2015  Multifocal paranasal sinus disease, most pronounced in the left ethmoid air cell complex and left sphenoid sinus region. Bilateral ostiomeatal unit obstruction. 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 No air-fluid levels.> referred to ENT 01/17/2015 >>>       Shortness of breath on exertion   Asthma exacerbation    Allergies  Allergen Reactions   Naproxen     "hives, I had to call 911"    Diclofenac     Hives    Penicillins Hives    Has patient had a PCN reaction causing immediate rash, facial/tongue/throat swelling, SOB or lightheadedness with hypotension: Yes Has patient had a PCN reaction causing severe rash involving mucus membranes or skin necrosis: Yes Has patient had a PCN reaction that required hospitalization No Has patient had a PCN reaction occurring within the last 10 years: No. If all of the above answers are "NO", then may proceed with Cephalosporin use.     Immunization History  Administered Date(s) Administered   Influenza Split 03/22/2014   Influenza,inj,quad, With Preservative 04/23/2019, 03/22/2020   PFIZER(Purple Top)SARS-COV-2 Vaccination 08/19/2019, 09/09/2019     Past Medical History:  Diagnosis Date   Anemia 2006   required transfusion post TAH/BSO 05/2005   Arthritis    Asthma    Chronic cough    Colon polyps    hyperplastic   Diabetes mellitus without complication (HCC)    Diverticulosis    GERD (gastroesophageal reflux disease)    on prilosec   GLA deficiency (HCC)    Glaucoma of both eyes    Hypertension    Joint pain    Osteoarthritis    Pancreatitis    Shortness of breath dyspnea    Vitamin D deficiency     Tobacco History: Social History   Tobacco Use  Smoking Status Former   Packs/day: 0.25   Years: 15.00   Pack years: 3.75  Types: Cigarettes   Quit date: 06/22/1978   Years since quitting: 42.8  Smokeless Tobacco Never   Counseling given: Not Answered   Continue to not smoke  Outpatient Encounter Medications as of 04/15/2021  Medication Sig   albuterol (PROVENTIL) (2.5 MG/3ML) 0.083% nebulizer solution Take 3 mLs (2.5 mg total) by nebulization every 6 (six) hours as needed for wheezing or shortness of breath.   aspirin EC 81 MG EC tablet Take 1 tablet (81 mg total) by mouth daily.   atorvastatin (LIPITOR) 20 MG tablet Take 1 tablet (20 mg total) by mouth daily at 6 PM.   budesonide-formoterol (SYMBICORT) 160-4.5 MCG/ACT inhaler Inhale 2 puffs into the lungs in the morning and at bedtime.   cetirizine (ZYRTEC ALLERGY) 10 MG tablet Take 1 tablet (10 mg total) by mouth daily.   chlorpheniramine (CHLOR-TRIMETON) 4 MG tablet Take 1 tablet (4 mg total) by mouth every 6 (six) hours as needed for allergies.   EPINEPHrine 0.3 mg/0.3 mL IJ SOAJ injection Inject 0.3 mg into the muscle as needed for anaphylaxis.   fluticasone (FLONASE) 50 MCG/ACT nasal spray Place 1 spray into both nostrils daily.   glucose blood test strip Use as instructed   MELATONIN PO Take 1 tablet by mouth at bedtime.   metFORMIN (GLUCOPHAGE) 1000 MG tablet Take 1,000 mg by mouth 2 (two) times daily with a meal.   montelukast (SINGULAIR) 10 MG  tablet Take 1 tablet (10 mg total) by mouth at bedtime.   omalizumab (XOLAIR) 150 MG/ML prefilled syringe INJECT 2 SYRINGES UNDER THE SKIN EVERY 2 WEEKS. TOTAL DOSE =375MG    omalizumab (XOLAIR) 75 MG/0.5ML prefilled syringe INJECT 1 SYRINGE UNDER THE SKIN EVERY 2 WEEKS. TOTAL DOSE OF 375 MG EVERY 14 DAYS.   omeprazole (PRILOSEC) 20 MG capsule Take 20 mg by mouth daily.   pioglitazone (ACTOS) 15 MG tablet Take 15 mg by mouth daily.   Travoprost, BAK Free, (TRAVATAN) 0.004 % SOLN ophthalmic solution    No facility-administered encounter medications on file as of 04/15/2021.     Review of Systems  Review of Systems  n/a Physical Exam  BP 118/78 (BP Location: Right Arm, Cuff Size: Normal)   Pulse 69   Ht 5\' 2"  (1.575 m)   Wt 197 lb 3.2 oz (89.4 kg)   SpO2 98%   BMI 36.07 kg/m   Wt Readings from Last 5 Encounters:  04/15/21 197 lb 3.2 oz (89.4 kg)  03/15/21 197 lb (89.4 kg)  02/13/21 192 lb 3.2 oz (87.2 kg)  10/30/20 188 lb 9.6 oz (85.5 kg)  09/18/20 193 lb (87.5 kg)    BMI Readings from Last 5 Encounters:  04/15/21 36.07 kg/m  03/15/21 36.03 kg/m  02/13/21 35.15 kg/m  10/30/20 34.50 kg/m  09/18/20 35.30 kg/m     Physical Exam General: Well-appearing, no acute distress Eyes: EOMI PERRLA Neck: Supple, no JVD Respiratory: Clear to auscultation bilaterally, normal work of breathing Cardiovascular: Regular rate and rhythm, no murmurs   Assessment & Plan:   Cough: Suspect related to poorly controlled asthma symptoms. Has responded to steroids.  Xolair as below.  Cough now resolved after starting Xolair.  Asthma: Elevated Eos and IgE in past. Recent exacerbation 09/19/2020 in setting of COVID 19 infection and now 2 subsequent exacerbations in the interim. On high dose symbicort and singulair. Steroid course x 1 since last visit.  Eosinophils and IgE both elevated in the past.  More recently, he is on 400 and IgE 900.  Started Xolair.  A few doses in.  Cough resolved.   Dyspnea improving.  Encouraged to continue Xolair.  Can stop at any time for some point she was uncomfortable.  Advised that in general recommend 1 year of stable symptoms before de-escalating therapy.  Return in about 3 months (around 07/16/2021).   Lanier Clam, MD 04/16/2021

## 2021-05-12 ENCOUNTER — Emergency Department (HOSPITAL_COMMUNITY): Payer: No Typology Code available for payment source

## 2021-05-12 ENCOUNTER — Encounter (HOSPITAL_COMMUNITY): Payer: Self-pay | Admitting: Oncology

## 2021-05-12 ENCOUNTER — Other Ambulatory Visit: Payer: Self-pay

## 2021-05-12 ENCOUNTER — Emergency Department (HOSPITAL_COMMUNITY)
Admission: EM | Admit: 2021-05-12 | Discharge: 2021-05-12 | Disposition: A | Discharge status: Home / Self Care | Payer: No Typology Code available for payment source | Attending: Emergency Medicine | Admitting: Emergency Medicine

## 2021-05-12 DIAGNOSIS — Z96641 Presence of right artificial hip joint: Secondary | ICD-10-CM | POA: Diagnosis not present

## 2021-05-12 DIAGNOSIS — S0990XA Unspecified injury of head, initial encounter: Secondary | ICD-10-CM

## 2021-05-12 DIAGNOSIS — W19XXXA Unspecified fall, initial encounter: Secondary | ICD-10-CM | POA: Diagnosis not present

## 2021-05-12 DIAGNOSIS — J45901 Unspecified asthma with (acute) exacerbation: Secondary | ICD-10-CM | POA: Insufficient documentation

## 2021-05-12 DIAGNOSIS — S0101XA Laceration without foreign body of scalp, initial encounter: Secondary | ICD-10-CM | POA: Diagnosis not present

## 2021-05-12 DIAGNOSIS — E119 Type 2 diabetes mellitus without complications: Secondary | ICD-10-CM | POA: Insufficient documentation

## 2021-05-12 DIAGNOSIS — Y990 Civilian activity done for income or pay: Secondary | ICD-10-CM | POA: Diagnosis not present

## 2021-05-12 DIAGNOSIS — Z87891 Personal history of nicotine dependence: Secondary | ICD-10-CM | POA: Insufficient documentation

## 2021-05-12 DIAGNOSIS — Z23 Encounter for immunization: Secondary | ICD-10-CM | POA: Diagnosis not present

## 2021-05-12 DIAGNOSIS — Z7952 Long term (current) use of systemic steroids: Secondary | ICD-10-CM | POA: Insufficient documentation

## 2021-05-12 DIAGNOSIS — I10 Essential (primary) hypertension: Secondary | ICD-10-CM | POA: Insufficient documentation

## 2021-05-12 DIAGNOSIS — Z7984 Long term (current) use of oral hypoglycemic drugs: Secondary | ICD-10-CM | POA: Insufficient documentation

## 2021-05-12 DIAGNOSIS — Z79899 Other long term (current) drug therapy: Secondary | ICD-10-CM | POA: Diagnosis not present

## 2021-05-12 DIAGNOSIS — Z7982 Long term (current) use of aspirin: Secondary | ICD-10-CM | POA: Insufficient documentation

## 2021-05-12 MED ORDER — TETANUS-DIPHTH-ACELL PERTUSSIS 5-2.5-18.5 LF-MCG/0.5 IM SUSY
0.5000 mL | PREFILLED_SYRINGE | Freq: Once | INTRAMUSCULAR | Status: AC
  Administered 2021-05-12: 0.5 mL via INTRAMUSCULAR
  Filled 2021-05-12: qty 0.5

## 2021-05-12 MED ORDER — LIDOCAINE HCL 2 % IJ SOLN
5.0000 mL | Freq: Once | INTRAMUSCULAR | Status: AC
  Administered 2021-05-12: 100 mg
  Filled 2021-05-12: qty 20

## 2021-05-12 NOTE — ED Triage Notes (Signed)
Pt bib [facility] from home s/p fall at work.  Pt had mechanical fall striking head on desk, resulting in laceration to forehead.  EMS reports moderate amount of blood on desk pt hit her head on as well as surrounding area.  No LOC, no neck/back pain.  Bleeding controlled prior to EMS arriving on scene.

## 2021-05-12 NOTE — Discharge Instructions (Addendum)
Have the stitches taken out in about 7 to 10 days.  There is 1 long running stitch of 9 stitches and another single interrupted stitch.

## 2021-05-12 NOTE — ED Provider Notes (Signed)
[facility] DEPT Provider Note   CSN: [id number] Arrival date & time: 05/12/21  0913     History Chief Complaint  Patient presents with   Tilden Fossa ZARRA GEFFERT is a 65 y.o. female.   Fall Pertinent negatives include no chest pain, no abdominal pain and no shortness of breath. Patient with mechanical fall.  Lost her balance.  [place] forward striking her head.  Laceration of forehead.  Reportedly fair amount of blood loss.  No loss conscious.  Not on blood thinners.  No lightheadedness or dizziness.  No fevers or chills.  No vision change.  No numbness weakness.  No other injury.     Past Medical History:  Diagnosis Date   Anemia 2006   required transfusion post TAH/BSO 05/2005   Arthritis    Asthma    Chronic cough    Colon polyps    hyperplastic   Diabetes mellitus without complication (HCC)    Diverticulosis    GERD (gastroesophageal reflux disease)    on prilosec   GLA deficiency ([place])    Glaucoma of both eyes    Hypertension    Joint pain    Osteoarthritis    Pancreatitis    Shortness of breath dyspnea    Vitamin D deficiency     Patient Active Problem List   Diagnosis Date Noted   Asthma exacerbation 09/19/2020   Anemia 10/13/2019   Glaucoma 10/13/2019   Hypertensive disorder 10/13/2019   Uterine leiomyoma 10/13/2019   Vitamin D insufficiency 04/26/2019   TIA (transient ischemic attack) 03/30/2019   Hyperlipidemia 03/30/2019   Thyroid nodule 03/30/2019   Stroke-like episode s/p IV tPA 03/28/2019   GERD (gastroesophageal reflux disease) 06/19/2018   Constipation 06/19/2018   Other fatigue 08/02/2017   Shortness of breath on exertion 08/02/2017   Mass of axilla 07/07/2017   Abdominal pain, epigastric    Abnormal CT of the abdomen    Pancreatitis, acute    Acute pancreatitis 08/13/2015   UTI (lower urinary tract infection) 08/13/2015   Severe obesity (BMI >= 40) ([place]) 01/23/2015   Sinusitis, chronic 01/08/2015    Cough variant asthma 01/03/2015   Arthritis of right hip 12/12/2013   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   Benign neoplasm of colon 08/22/2013   Special screening for malignant neoplasms, colon 08/22/2013   Abdominal pain 08/19/2013   Diabetes mellitus without complication ([place]) 16/60/6301   Essential hypertension, benign 08/19/2013   Ventral hernia 08/19/2013    Past Surgical History:  Procedure Laterality Date   ABDOMINAL HYSTERECTOMY     BREAST EXCISIONAL BIOPSY Right 1990   COLONOSCOPY N/A 08/22/2013   Procedure: COLONOSCOPY;  Surgeon: Irene Shipper, MD;  Location: [facility];  Service: Endoscopy;  Laterality: N/A;   ESOPHAGOGASTRODUODENOSCOPY N/A 08/22/2013   Procedure: ESOPHAGOGASTRODUODENOSCOPY (EGD);  Surgeon: Irene Shipper, MD;  Location: [facility] ENDOSCOPY;  Service: Endoscopy;  Laterality: N/A;   ESOPHAGOGASTRODUODENOSCOPY (EGD) WITH PROPOFOL N/A 08/19/2015   Procedure: ESOPHAGOGASTRODUODENOSCOPY (EGD) WITH PROPOFOL;  Surgeon: Irene Shipper, MD;  Location: [facility] ENDOSCOPY;  Service: Endoscopy;  Laterality: N/A;   HERNIA REPAIR     HYSTEROSCOPY WITH D & C  01/2005   for uterine fibroids.    INSERTION OF MESH N/A 08/24/2013   Procedure: INSERTION OF MESH;  Surgeon: Edward Jolly, MD;  Location: [place];  Service: General;  Laterality: N/A;   JOINT REPLACEMENT Right    LIPOMA EXCISION Left 06/21/2015   Procedure: EXCISION OF  LEFT SCALP LIPOMA;  Surgeon: Johnathan Hausen, MD;  Location: [place];  Service: General;  Laterality: Left;   PANNICULECTOMY N/A 08/24/2013   Procedure: PANNICULECTOMY;  Surgeon: Edward Jolly, MD;  Location: [place];  Service: General;  Laterality: N/A;   TOTAL ABDOMINAL HYSTERECTOMY W/ BILATERAL SALPINGOOPHORECTOMY  05/2005   TOTAL HIP ARTHROPLASTY Right 12/12/2013   Procedure: RIGHT TOTAL HIP ARTHROPLASTY ANTERIOR APPROACH;  Surgeon: Mcarthur Rossetti, MD;  Location: [place];  Service: Orthopedics;  Laterality: Right;   VENTRAL  HERNIA REPAIR N/A 08/24/2013   Procedure: HERNIA REPAIR VENTRAL ADULT;  Surgeon: Edward Jolly, MD;  Location: [facility] OR;  Service: General;  Laterality: N/A;     OB History     Gravida  5   Para      Term      Preterm      AB      Living  5      SAB      IAB      Ectopic      Multiple      Live Births              Family History  Problem Relation Age of Onset   Emphysema Mother        smoked   Diabetes Mother    Hypertension Mother    Colon cancer Father        7-s   Hypertension Father    Heart disease Father    High Cholesterol Father    Breast cancer Sister     Social History   Tobacco Use   Smoking status: Former    Packs/day: 0.25    Years: 15.00    Pack years: 3.75    Types: Cigarettes    Quit date: 06/22/1978    Years since quitting: 42.9   Smokeless tobacco: Never  Vaping Use   Vaping Use: Never used  Substance Use Topics   Alcohol use: No    Alcohol/week: 0.0 standard drinks   Drug use: No    Home Medications Prior to Admission medications   Medication Sig Start Date End Date Taking? Authorizing Provider  albuterol (PROVENTIL) (2.5 MG/3ML) 0.083% nebulizer solution Take 3 mLs (2.5 mg total) by nebulization every 6 (six) hours as needed for wheezing or shortness of breath. 06/29/19   Jaynee Eagles, PA-C  aspirin EC 81 MG EC tablet Take 1 tablet (81 mg total) by mouth daily. 03/31/19   Donzetta Starch, NP  atorvastatin (LIPITOR) 20 MG tablet Take 1 tablet (20 mg total) by mouth daily at 6 PM. 03/30/19   Donzetta Starch, NP  budesonide-formoterol (SYMBICORT) 160-4.5 MCG/ACT inhaler Inhale 2 puffs into the lungs in the morning and at bedtime. 02/25/21   Hunsucker, Bonna Gains, MD  cetirizine (ZYRTEC ALLERGY) 10 MG tablet Take 1 tablet (10 mg total) by mouth daily. 08/31/20   Hazel Sams, PA-C  chlorpheniramine (CHLOR-TRIMETON) 4 MG tablet Take 1 tablet (4 mg total) by mouth every 6 (six) hours as needed for allergies. 09/18/20   Martyn Ehrich, NP  EPINEPHrine 0.3 mg/0.3 mL IJ SOAJ injection Inject 0.3 mg into the muscle as needed for anaphylaxis. 01/29/21   Hunsucker, Bonna Gains, MD  fluticasone (FLONASE) 50 MCG/ACT nasal spray Place 1 spray into both nostrils daily. 08/31/20   Marin Roberts E, PA-C  glucose blood test strip Use as instructed 08/25/17   Dennard Nip D, MD  MELATONIN PO Take 1 tablet  by mouth at bedtime.    [provider]  metFORMIN (GLUCOPHAGE) 1000 MG tablet Take 1,000 mg by mouth 2 (two) times daily with a meal.    [provider]  montelukast (SINGULAIR) 10 MG tablet Take 1 tablet (10 mg total) by mouth at bedtime. 04/17/20   Hunsucker, Bonna Gains, MD  omalizumab Arvid Right) 150 MG/ML prefilled syringe INJECT 2 SYRINGES UNDER THE SKIN EVERY 2 WEEKS. TOTAL DOSE =375MG  03/13/21   Hunsucker, Bonna Gains, MD  omalizumab Arvid Right) 75 MG/0.5ML prefilled syringe INJECT 1 SYRINGE UNDER THE SKIN EVERY 2 WEEKS. TOTAL DOSE OF 375 MG EVERY 14 DAYS. 03/13/21   Hunsucker, Bonna Gains, MD  omeprazole (PRILOSEC) 20 MG capsule Take 20 mg by mouth daily.    [provider]  pioglitazone (ACTOS) 15 MG tablet Take 15 mg by mouth daily. 09/06/19   [provider]  Travoprost, BAK Free, (TRAVATAN) 0.004 % SOLN ophthalmic solution  08/11/19   [provider]    Allergies    Naproxen, Diclofenac, and Penicillins  Review of Systems   Review of Systems  Constitutional:  Negative for appetite change.  HENT:  Negative for congestion.   Respiratory:  Negative for shortness of breath.   Cardiovascular:  Negative for chest pain.  Gastrointestinal:  Negative for abdominal pain.  Genitourinary:  Negative for dysuria.  Musculoskeletal:  Negative for back pain.  Skin:  Positive for wound.  Neurological:  Negative for weakness.  Psychiatric/Behavioral:  Negative for confusion.    Physical Exam Updated Vital Signs BP (!) 150/76 (BP Location: Right Arm)   Pulse (!) 58   Temp 98.6 F (37 C) (Oral)   Resp  14   Ht 5\' 2"  (1.575 m)   Wt 83.5 kg   SpO2 100%   BMI 33.65 kg/m   Physical Exam Vitals reviewed.  HENT:     Head: Normocephalic.     Comments: Horizontal laceration on scalp just above the hairline towards the midline.  aProximately 4 cm long. Eyes:     Pupils: Pupils are equal, round, and reactive to light.  Neck:     Comments: Midline tenderness without deformity Cardiovascular:     Rate and Rhythm: Regular rhythm.  Pulmonary:     Breath sounds: No wheezing or rhonchi.  Abdominal:     Tenderness: There is no abdominal tenderness.  Musculoskeletal:        General: No tenderness.  Skin:    General: Skin is warm.     Capillary Refill: Capillary refill takes less than 2 seconds.  Neurological:     Mental Status: She is alert and oriented to person, place, and time.    ED Results / Procedures / Treatments   Labs (all labs ordered are listed, but only abnormal results are displayed) Labs Reviewed - No data to display  EKG None  Radiology CT HEAD WO CONTRAST (5MM)  Result Date: 05/12/2021 CLINICAL DATA:  Head trauma, mod-severe; Neck trauma, midline tenderness (Age 10-64y) EXAM: CT HEAD WITHOUT CONTRAST CT CERVICAL SPINE WITHOUT CONTRAST TECHNIQUE: Multidetector CT imaging of the head and cervical spine was performed following the standard protocol without intravenous contrast. Multiplanar CT image reconstructions of the cervical spine were also generated. COMPARISON:  MRI March 29, 2019 FINDINGS: CT HEAD FINDINGS Brain: No evidence of acute large vascular territory infarct, acute hemorrhage, hydrocephalus, extra-axial collection or mass lesion/mass effect. Vascular: No hyperdense vessel identified. Calcific intracranial atherosclerosis. Skull: No acute fracture.  Left posterior scalp contusion. Sinuses/Orbits: Moderate mucosal thickening  of the visualized sinuses. Remote left medial orbital wall fracture, visible on prior MRI. Other: No mastoid effusions. CT CERVICAL SPINE  FINDINGS Alignment: Reversal of the normal cervical lordosis. No substantial sagittal subluxation. Skull base and vertebrae: Vertebral body heights are maintained. No acute fracture Soft tissues and spinal canal: No prevertebral fluid or swelling. No visible canal hematoma. Disc levels: Moderate degenerative disease at C4-C5 and C5-C6 with disc height loss and endplate spurring. Upper chest: Visualized lung apices are clear. Other: 3.6 cm left thyroid nodule, further evaluated on prior ultrasound and FNA on April 26, 2019 (ref: J Am Coll Radiol. 2015 Feb;12(2): 143-50). IMPRESSION: CT head: 1. No evidence of acute intracranial abnormality. 2. Posterior left scalp contusion without acute fracture. CT cervical spine: 1. No evidence of acute fracture or traumatic malalignment. 2. Moderate multilevel degenerative disc disease. Electronically Signed   By: Margaretha Sheffield M.D.   On: 05/12/2021 14:51   CT Cervical Spine Wo Contrast  Result Date: 05/12/2021 CLINICAL DATA:  Head trauma, mod-severe; Neck trauma, midline tenderness (Age 25-64y) EXAM: CT HEAD WITHOUT CONTRAST CT CERVICAL SPINE WITHOUT CONTRAST TECHNIQUE: Multidetector CT imaging of the head and cervical spine was performed following the standard protocol without intravenous contrast. Multiplanar CT image reconstructions of the cervical spine were also generated. COMPARISON:  MRI March 29, 2019 FINDINGS: CT HEAD FINDINGS Brain: No evidence of acute large vascular territory infarct, acute hemorrhage, hydrocephalus, extra-axial collection or mass lesion/mass effect. Vascular: No hyperdense vessel identified. Calcific intracranial atherosclerosis. Skull: No acute fracture.  Left posterior scalp contusion. Sinuses/Orbits: Moderate mucosal thickening of the visualized sinuses. Remote left medial orbital wall fracture, visible on prior MRI. Other: No mastoid effusions. CT CERVICAL SPINE FINDINGS Alignment: Reversal of the normal cervical lordosis. No  substantial sagittal subluxation. Skull base and vertebrae: Vertebral body heights are maintained. No acute fracture Soft tissues and spinal canal: No prevertebral fluid or swelling. No visible canal hematoma. Disc levels: Moderate degenerative disease at C4-C5 and C5-C6 with disc height loss and endplate spurring. Upper chest: Visualized lung apices are clear. Other: 3.6 cm left thyroid nodule, further evaluated on prior ultrasound and FNA on April 26, 2019 (ref: J Am Coll Radiol. 2015 Feb;12(2): 143-50). IMPRESSION: CT head: 1. No evidence of acute intracranial abnormality. 2. Posterior left scalp contusion without acute fracture. CT cervical spine: 1. No evidence of acute fracture or traumatic malalignment. 2. Moderate multilevel degenerative disc disease. Electronically Signed   By: Margaretha Sheffield M.D.   On: 05/12/2021 14:51    Procedures .Marland KitchenLaceration Repair  Date/Time: 05/12/2021 4:35 PM Performed by: Davonna Belling, MD Authorized by: Davonna Belling, MD   Consent:    Consent obtained:  Verbal   Consent given by:  Patient   Risks discussed:  Infection, pain, need for additional repair, nerve damage, poor wound healing, poor cosmetic result and vascular damage Anesthesia:    Anesthesia method:  Local infiltration   Local anesthetic:  Lidocaine 1% w/o epi Laceration details:    Location:  Scalp   Scalp location:  Frontal   Length (cm):  5 Pre-procedure details:    Preparation:  Patient was prepped and draped in usual sterile fashion Exploration:    Limited defect created (wound extended): no     Hemostasis achieved with:  Direct pressure   Imaging obtained: x-ray     Imaging outcome: foreign body not noted   Treatment:    Area cleansed with:  Saline   Amount of cleaning:  Standard Skin repair:  Repair method:  Sutures   Suture size:  4-0   Suture material:  Prolene   Number of sutures: 9 running stitches and 1 simple interrupted. Approximation:    Approximation:   Close Repair type:    Repair type:  Simple Post-procedure details:    Procedure completion:  Tolerated well, no immediate complications   Medications Ordered in ED Medications  Tdap (BOOSTRIX) injection 0.5 mL (0.5 mLs Intramuscular Given 05/12/21 1353)  lidocaine (XYLOCAINE) 2 % (with pres) injection 100 mg (100 mg Infiltration Given by Other 05/12/21 1541)    ED Course  I have reviewed the triage vital signs and the nursing notes.  Pertinent labs & imaging results that were available during my care of the patient were reviewed by me and considered in my medical decision making (see chart for details).    MDM Rules/Calculators/A&P                           Patient with fall.  Mechanical fall.  Hit forehead.  Laceration closed.  Imaging reassuring.  No other apparent injury.  Has had some history of anemia but feeling well and no lightheadedness.  Peers stable for discharge home. Final Clinical Impression(s) / ED Diagnoses Final diagnoses:  Fall, initial encounter  Laceration of scalp, initial encounter  Minor head injury, initial encounter    Rx / DC Orders ED Discharge Orders     None        Davonna Belling, MD 05/12/21 1636

## 2021-05-13 ENCOUNTER — Ambulatory Visit: Payer: Worker's Compensation

## 2021-05-13 ENCOUNTER — Other Ambulatory Visit: Payer: Self-pay | Admitting: Occupational Medicine

## 2021-05-13 DIAGNOSIS — M79641 Pain in right hand: Secondary | ICD-10-CM

## 2021-05-13 MED ORDER — HYDROCODONE-ACETAMINOPHEN 5-325 MG PO TABS: 1.0000 | ORAL_TABLET | Freq: Three times a day (TID) | ORAL | 0 refills | Status: DC | PRN

## 2021-05-13 NOTE — Progress Notes (Unsigned)
Work injury.  Notes in systoc

## 2021-05-17 ENCOUNTER — Other Ambulatory Visit: Payer: Self-pay | Admitting: Pulmonary Disease

## 2021-05-17 DIAGNOSIS — J4530 Mild persistent asthma, uncomplicated: Secondary | ICD-10-CM

## 2021-07-02 ENCOUNTER — Ambulatory Visit: Payer: BC Managed Care – PPO | Admitting: Pulmonary Disease

## 2021-07-02 ENCOUNTER — Other Ambulatory Visit: Payer: Self-pay

## 2021-07-02 ENCOUNTER — Encounter: Payer: Self-pay | Admitting: Pulmonary Disease

## 2021-07-02 VITALS — BP 122/68 | HR 63 | Temp 98.7°F | Ht 62.0 in | Wt 190.2 lb

## 2021-07-02 DIAGNOSIS — R0602 Shortness of breath: Secondary | ICD-10-CM

## 2021-07-02 DIAGNOSIS — J45991 Cough variant asthma: Secondary | ICD-10-CM

## 2021-07-02 NOTE — Patient Instructions (Addendum)
Nice to see you again  No changes to medications today  I hope you get some rest soon   Return to clinic in 6 months or sooner as needed

## 2021-07-08 ENCOUNTER — Telehealth: Payer: Self-pay

## 2021-07-08 NOTE — Telephone Encounter (Signed)
Received faxed PA renewal form for XOLAIR from [facility]. Completed form and faxed back along with chart notes. Will await response.

## 2021-07-09 NOTE — Telephone Encounter (Signed)
Received notification from [facility] regarding a prior authorization for XOLAIR. Authorization has been APPROVED from 07/08/2021 to 07/08/2022. Approval letter sent to scan center.  Authorization # [id number]

## 2021-07-18 NOTE — Progress Notes (Signed)
@Patient  ID: [name], female    DOB: Apr 26, 1956, 66 y.o.   MRN: [id number]  Chief Complaint  Patient presents with   Follow-up    3 month follow up. SHOB is better but pt does states she does have some wheezing that started this week     Referring provider: Antony Contras, MD  HPI:   66 y.o.woman who previously followed in pulmonary clinic with Dr. Melvyn Novas with asthma here for follow up of DOE felt to be poorly controlled asthma exacerbated by COVID infection.  ED note 04/2021 reviewed.  Multiple pharmacy notes from our clinic reviewed.  I continue Xolair.  Overall symptoms improved.  Still having some difficulty with injections.  But getting them done at home.  She reports good adherence to her maintenance inhalers.  Not using albuterol very often, very rarely if ever.  HPI at initial visit: Patient has been diagnosed with asthma for years.  Usually relatively well controlled.  Uses Symbicort mid dose.  About once or twice a years with changes states since, spring and fall she has worsening breathing.  This occurred earlier in the month.  All of a sudden she had significant chest tightness, wheeze, shortness of breath.  Associated with hacking cough.  Dry.  Went to urgent care and received a dose of IV steroids.  Her symptoms have improved a good deal but still has lingering cough and mild chest tightness, shortness of breath.  No fever or chills.  No sick contacts.  Denies any viral symptoms such as sore throat, runny nose, fever at time of onset of symptoms.  When asked, she does think maybe she had a little more nasal congestion on the days preceding this acute change.  Chest x-ray 03/04/2020 reviewed and interpreted as clear lungs with mild hyperinflation with increased AP diameter on lateral film.  Prior labs reviewed which are notable for elevated IgE greater than 1200 and eosinophils as high as 800 in the past.  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  Epworth:  No flowsheet data found.  Tests:   FENO:  No results found for: NITRICOXIDE  PFT: No flowsheet data found.  WALK:  No flowsheet data found.  Imaging: Personally reviewed, per EMR and discussion in this note  Lab Results: Personally reviewed, eosinophils elevated up to 800 in the past, IgE greater than 1200 CBC    Component Value Date/Time   WBC 7.0 03/15/2021 1539   RBC 4.06 03/15/2021 1539   HGB 12.1 03/15/2021 1539   HGB 13.4 08/02/2017 1035   HCT 38.1 03/15/2021 1539   HCT 40.7 08/02/2017 1035   PLT 269 03/15/2021 1539   MCV 93.8 03/15/2021 1539   MCV 86 08/02/2017 1035   MCH 29.8 03/15/2021 1539   MCHC 31.8 03/15/2021 1539   RDW 14.2 03/15/2021 1539   RDW 14.9 08/02/2017 1035   LYMPHSABS 0.4 (L) 03/15/2021 1539   LYMPHSABS 2.2 08/02/2017 1035   MONOABS 0.3 03/15/2021 1539   EOSABS 0.0 03/15/2021 1539   EOSABS 0.2 08/02/2017 1035   BASOSABS 0.0 03/15/2021 1539   BASOSABS 0.0 08/02/2017 1035  BMET    Component Value Date/Time   NA 138 03/15/2021 1539   NA 141 08/02/2017 1035   K 3.7 03/15/2021 1539   CL 106 03/15/2021 1539   CO2 21 (L) 03/15/2021 1539   GLUCOSE 493 (H) 03/15/2021 1539   BUN 13 03/15/2021 1539   BUN 14 08/02/2017 1035   CREATININE 1.06 (H) 03/15/2021 1539   CALCIUM 9.3 03/15/2021 1539   GFRNONAA 59 (L) 03/15/2021 1539   GFRAA >60 03/28/2019 1326    BNP No results found for: BNP  ProBNP No results found for: PROBNP  Specialty Problems       Pulmonary Problems   Cough variant asthma    Followed in Pulmonary  clinic/ [facility]/ [place]   - allergy profile 01/03/2015 > 0.8, IgE 1256 cat > dog  Dust/ mold - sinus ct > Pos > see sinusitis - 01/17/2015 p extensive coaching HFA effectiveness =   75% > continue symbicort 80 2bid       Sinusitis, chronic    Sinus CT 01/08/2015  Multifocal paranasal sinus disease, most pronounced in the left ethmoid air cell complex and left sphenoid sinus region. Bilateral ostiomeatal unit obstruction. 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 No air-fluid levels.> referred to ENT 01/17/2015 >>>       Shortness of breath on exertion   Asthma exacerbation    Allergies  Allergen Reactions   Naproxen     "hives, I had to call 911"    Diclofenac     Hives    Penicillins Hives    Has patient had a PCN reaction causing immediate rash, facial/tongue/throat swelling, SOB or lightheadedness with hypotension: Yes Has patient had a PCN reaction causing severe rash involving mucus membranes or skin necrosis: Yes Has patient had a PCN reaction that required hospitalization No Has patient had a PCN reaction occurring within the last 10 years: No. If all of the above answers are "NO", then may proceed with Cephalosporin use.     Immunization History  Administered Date(s) Administered   Influenza Split 03/22/2014   Influenza,inj,Quad PF,6+ Mos 04/19/2017, 05/08/2018, 03/18/2019   Influenza,inj,quad, With Preservative 04/23/2019, 03/22/2020   PFIZER(Purple Top)SARS-COV-2 Vaccination 08/19/2019, 09/09/2019   Tdap 05/12/2021    Past Medical History:  Diagnosis Date   Anemia 2006   required transfusion post TAH/BSO 05/2005   Arthritis    Asthma    Chronic cough    Colon polyps    hyperplastic   Diabetes mellitus without complication (HCC)    Diverticulosis    GERD (gastroesophageal reflux disease)    on prilosec   GLA deficiency ([place])    Glaucoma of both eyes    Hypertension    Joint pain    Osteoarthritis    Pancreatitis    Shortness of  breath dyspnea    Vitamin D deficiency     Tobacco History: Social History   Tobacco Use  Smoking Status Former   Packs/day: 0.25   Years: 15.00   Pack years: 3.75   Types: Cigarettes   Quit date: 06/22/1978   Years since quitting: 43.1  Smokeless Tobacco Never   Counseling given: Not Answered   Continue to not smoke  Outpatient Encounter Medications as of 07/02/2021  Medication Sig   albuterol (PROVENTIL) (2.5 MG/3ML) 0.083% nebulizer solution Take 3 mLs (2.5 mg total) by nebulization every 6 (six) hours as needed for wheezing or shortness of breath.   aspirin EC 81 MG EC tablet Take 1 tablet (81  mg total) by mouth daily.   atorvastatin (LIPITOR) 20 MG tablet Take 1 tablet (20 mg total) by mouth daily at 6 PM.   budesonide-formoterol (SYMBICORT) 160-4.5 MCG/ACT inhaler Inhale 2 puffs into the lungs in the morning and at bedtime.   cetirizine (ZYRTEC ALLERGY) 10 MG tablet Take 1 tablet (10 mg total) by mouth daily.   chlorpheniramine (CHLOR-TRIMETON) 4 MG tablet Take 1 tablet (4 mg total) by mouth every 6 (six) hours as needed for allergies.   EPINEPHrine 0.3 mg/0.3 mL IJ SOAJ injection Inject 0.3 mg into the muscle as needed for anaphylaxis.   fluticasone (FLONASE) 50 MCG/ACT nasal spray Place 1 spray into both nostrils daily.   glucose blood test strip Use as instructed   HYDROcodone-acetaminophen (NORCO) 5-325 MG tablet Take 1 tablet by mouth every 8 (eight) hours as needed for moderate pain.   MELATONIN PO Take 1 tablet by mouth at bedtime.   metFORMIN (GLUCOPHAGE) 1000 MG tablet Take 1,000 mg by mouth 2 (two) times daily with a meal.   montelukast (SINGULAIR) 10 MG tablet TAKE 1 TABLET(10 MG) BY MOUTH AT BEDTIME   omalizumab (XOLAIR) 150 MG/ML prefilled syringe INJECT 2 SYRINGES UNDER THE SKIN EVERY 2 WEEKS. TOTAL DOSE =375MG    omalizumab (XOLAIR) 75 MG/0.5ML prefilled syringe INJECT 1 SYRINGE UNDER THE SKIN EVERY 2 WEEKS. TOTAL DOSE OF 375 MG EVERY 14 DAYS.   omeprazole  (PRILOSEC) 20 MG capsule Take 20 mg by mouth daily.   pioglitazone (ACTOS) 15 MG tablet Take 15 mg by mouth daily.   Travoprost, BAK Free, (TRAVATAN) 0.004 % SOLN ophthalmic solution    No facility-administered encounter medications on file as of 07/02/2021.     Review of Systems  Review of Systems  n/a Physical Exam  BP 122/68 (BP Location: Left Arm, Patient Position: Sitting, Cuff Size: Normal)    Pulse 63    Temp 98.7 F (37.1 C) (Oral)    Ht 5\' 2"  (1.575 m)    Wt 190 lb 3.2 oz (86.3 kg)    SpO2 98%    BMI 34.79 kg/m   Wt Readings from Last 5 Encounters:  07/02/21 190 lb 3.2 oz (86.3 kg)  05/12/21 184 lb (83.5 kg)  04/15/21 197 lb 3.2 oz (89.4 kg)  03/15/21 197 lb (89.4 kg)  02/13/21 192 lb 3.2 oz (87.2 kg)    BMI Readings from Last 5 Encounters:  07/02/21 34.79 kg/m  05/12/21 33.65 kg/m  04/15/21 36.07 kg/m  03/15/21 36.03 kg/m  02/13/21 35.15 kg/m     Physical Exam General: Well-appearing, no acute distress Eyes: EOMI PERRLA Neck: Supple, no JVD Respiratory: Clear to auscultation bilaterally, normal work of breathing Cardiovascular: Regular rate and rhythm, no murmurs   Assessment & Plan:   Cough: Suspect related to poorly controlled asthma symptoms. Has responded to steroids.  Xolair as below.  Cough now resolved after starting Xolair.  Asthma: Elevated Eos and IgE in past. Recent exacerbation 09/19/2020 in setting of COVID 19 infection and now 2 subsequent exacerbations in the interim. On high dose symbicort and singulair. Steroid course x 1 since last visit.  Eosinophils and IgE both elevated in the past.  More recently, he is on 400 and IgE 900.  Started Xolair.  A few doses in.  Cough resolved.  Dyspnea improving.  Encouraged to continue Xolair.  Can stop at any time for some point she was uncomfortable.  Advised that in general recommend 1 year of stable symptoms before de-escalating therapy.  Return in  about 6 months (around 12/30/2021).   Lanier Clam, MD 07/18/2021

## 2021-08-27 ENCOUNTER — Ambulatory Visit: Payer: BC Managed Care – PPO | Admitting: Orthopaedic Surgery

## 2021-08-27 ENCOUNTER — Ambulatory Visit: Payer: Self-pay

## 2021-08-27 DIAGNOSIS — Z96641 Presence of right artificial hip joint: Secondary | ICD-10-CM

## 2021-08-27 DIAGNOSIS — M7061 Trochanteric bursitis, right hip: Secondary | ICD-10-CM

## 2021-08-27 MED ORDER — LIDOCAINE HCL 1 % IJ SOLN
3.0000 mL | INTRAMUSCULAR | Status: AC | PRN
  Administered 2021-08-27: 3 mL

## 2021-08-27 MED ORDER — TRAMADOL HCL 50 MG PO TABS: 50.0000 mg | ORAL_TABLET | Freq: Two times a day (BID) | ORAL | 0 refills | Status: DC | PRN

## 2021-08-27 MED ORDER — METHYLPREDNISOLONE ACETATE 40 MG/ML IJ SUSP
40.0000 mg | INTRAMUSCULAR | Status: AC | PRN
Start: 2021-08-27 — End: 2021-08-27
  Administered 2021-08-27: 40 mg via INTRA_ARTICULAR

## 2021-08-27 NOTE — Progress Notes (Signed)
? ?Office Visit Note ?  ?Patient: [name]           ?Date of Birth: 12-28-1955           ?MRN: [id number] ?Visit Date: 08/27/2021 ?             ?Requested by: Antony Contras, MD ?[address] ?[address] ?[place],  [place] [address] ?PCP: Antony Contras, MD ? ? ?Assessment & Plan: ?Visit Diagnoses:  ?1. History of right hip replacement   ?2. Trochanteric bursitis, right hip   ? ? ?Plan: Her signs and symptoms as well as clinical exam seem consistent with trochanteric bursitis of her right hip.  I recommend a steroid injection over this area.  She is a diabetic but has good control.  I discussed the risk and benefits of steroid injections and she tolerated it well.  She reports hives and an anaphylactic type response to anti-inflammatories.  I was going to send in some meloxicam but I sent in tramadol instead.  I will see her back in 4 weeks.  If she is not having much improvement my neck step would be to send her to formal physical therapy for modalities such as dry needling that could help this feel better. ? ?Follow-Up Instructions: Return in about 4 weeks (around 09/24/2021).  ? ?Orders:  ?Orders Placed This Encounter  ?Procedures  ? Large Joint Inj  ? XR HIP UNILAT W OR W/O PELVIS 1V RIGHT  ? ?No orders of the defined types were placed in this encounter. ? ? ? ? Procedures: ?Large Joint Inj: R greater trochanter on 08/27/2021 2:32 PM ?Indications: pain and diagnostic evaluation ?Details: 22 G 1.5 in needle, lateral approach ? ?Arthrogram: No ? ?Medications: 3 mL lidocaine 1 %; 40 mg methylPREDNISolone acetate 40 MG/ML ?Outcome: tolerated well, no immediate complications ?Procedure, treatment alternatives, risks and benefits explained, specific risks discussed. Consent was given by the patient. Immediately prior to procedure a time out was called to verify the correct patient, procedure, equipment, support staff and site/side marked as required. Patient was prepped and draped in the usual sterile fashion.   ? ? ? ? ?Clinical Data: ?No additional findings. ? ? ?Subjective: ?Chief Complaint  ?Patient presents with  ? Right Hip - Pain  ?The patient is well-known to me.  She is 8 years out from a right total hip arthroplasty.  She is only 65 years old.  She is having a lot of pain on the lateral aspect of her right hip and she says she has been limping recently and feels like it may be swelling.  She denies any groin pain at all.  She denies any recent injuries.  She is diabetic but reports good control.  Her allergies no reported anaphylaxis to anti-inflammatories. ? ?HPI ? ?Review of Systems ?She currently denies any fever, chills, nausea, vomiting ? ?Objective: ?Vital Signs: There were no vitals taken for this visit. ? ?Physical Exam ?She is alert and orient x3 and in no acute distress ?Ortho Exam ?Examination of her right hip shows a has fluid and full range of motion and no pain in the groin at all.  There is only pain to palpation over the proximal trochanteric area and IT band.  Her incision is healed nicely.  Her leg lengths are equal. ?Specialty Comments:  ?No specialty comments available. ? ?Imaging: ?XR HIP UNILAT W OR W/O PELVIS 1V RIGHT ? ?Result Date: 08/27/2021 ?An AP pelvis and lateral of the right hip shows a  well-seated total hip arthroplasty with no gross complicating features.  ? ? ?PMFS History: ?Patient Active Problem List  ? Diagnosis Date Noted  ? Asthma exacerbation 09/19/2020  ? Anemia 10/13/2019  ? Glaucoma 10/13/2019  ? Hypertensive disorder 10/13/2019  ? Uterine leiomyoma 10/13/2019  ? Vitamin D insufficiency 04/26/2019  ? TIA (transient ischemic attack) 03/30/2019  ? Hyperlipidemia 03/30/2019  ? Thyroid nodule 03/30/2019  ? Stroke-like episode s/p IV tPA 03/28/2019  ? GERD (gastroesophageal reflux disease) 06/19/2018  ? Constipation 06/19/2018  ? Other fatigue 08/02/2017  ? Shortness of breath on exertion 08/02/2017  ? Mass of axilla 07/07/2017  ? Abdominal pain, epigastric   ? Abnormal CT of  the abdomen   ? Pancreatitis, acute   ? Acute pancreatitis 08/13/2015  ? UTI (lower urinary tract infection) 08/13/2015  ? Severe obesity (BMI >= 40) ([place]) 01/23/2015  ? Sinusitis, chronic 01/08/2015  ? Cough variant asthma 01/03/2015  ? Arthritis of right hip 12/12/2013  ? 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  ? Benign neoplasm of colon 08/22/2013  ? Special screening for malignant neoplasms, colon 08/22/2013  ? Abdominal pain 08/19/2013  ? Diabetes mellitus without complication ([place]) 17/79/3903  ? Essential hypertension, benign 08/19/2013  ? Ventral hernia 08/19/2013  ? ?Past Medical History:  ?Diagnosis Date  ? Anemia 2006  ? required transfusion post TAH/BSO 05/2005  ? Arthritis   ? Asthma   ? Chronic cough   ? Colon polyps   ? hyperplastic  ? Diabetes mellitus without complication ([place])   ? Diverticulosis   ? GERD (gastroesophageal reflux disease)   ? on prilosec  ? GLA deficiency ([place])   ? Glaucoma of both eyes   ? Hypertension   ? Joint pain   ? Osteoarthritis   ? Pancreatitis   ? Shortness of breath dyspnea   ? Vitamin D deficiency   ?  ?Family History  ?Problem Relation Age of Onset  ? Emphysema Mother   ?     smoked  ? Diabetes Mother   ? Hypertension Mother   ? Colon cancer Father   ?     7-s  ? Hypertension Father   ? Heart disease Father   ? High Cholesterol Father   ? Breast cancer Sister   ?  ?Past Surgical History:  ?Procedure Laterality Date  ? ABDOMINAL HYSTERECTOMY    ? BREAST EXCISIONAL BIOPSY Right 1990  ? COLONOSCOPY N/A 08/22/2013  ? Procedure: COLONOSCOPY;  Surgeon: Irene Shipper, MD;  Location: [facility] ENDOSCOPY;  Service: Endoscopy;  Laterality: N/A;  ? ESOPHAGOGASTRODUODENOSCOPY N/A 08/22/2013  ? Procedure: ESOPHAGOGASTRODUODENOSCOPY (EGD);  Surgeon: Irene Shipper, MD;  Location: [facility] ENDOSCOPY;  Service: Endoscopy;  Laterality: N/A;  ? ESOPHAGOGASTRODUODENOSCOPY (EGD) WITH PROPOFOL N/A 08/19/2015  ? Procedure: ESOPHAGOGASTRODUODENOSCOPY (EGD) WITH PROPOFOL;  Surgeon: Irene Shipper, MD;   Location: [facility] ENDOSCOPY;  Service: Endoscopy;  Laterality: N/A;  ? HERNIA REPAIR    ? HYSTEROSCOPY WITH D & C  01/2005  ? for uterine fibroids.   ? INSERTION OF MESH N/A 08/24/2013  ? Procedure: INSERTION OF MESH;  Surgeon: Edward Jolly, MD;  Location: [place];  Service: General;  Laterality: N/A;  ? JOINT REPLACEMENT Right   ? LIPOMA EXCISION Left 06/21/2015  ? Procedure: EXCISION OF LEFT SCALP LIPOMA;  Surgeon: Johnathan Hausen, MD;  Location: [place];  Service: General;  Laterality: Left;  ? PANNICULECTOMY N/A 08/24/2013  ? Procedure: PANNICULECTOMY;  Surgeon: Edward Jolly, MD;  Location:  [facility];  Service: General;  Laterality: N/A;  ? TOTAL ABDOMINAL HYSTERECTOMY W/ BILATERAL SALPINGOOPHORECTOMY  05/2005  ? TOTAL HIP ARTHROPLASTY Right 12/12/2013  ? Procedure: RIGHT TOTAL HIP ARTHROPLASTY ANTERIOR APPROACH;  Surgeon: Mcarthur Rossetti, MD;  Location: [facility];  Service: Orthopedics;  Laterality: Right;  ? VENTRAL HERNIA REPAIR N/A 08/24/2013  ? Procedure: HERNIA REPAIR VENTRAL ADULT;  Surgeon: Edward Jolly, MD;  Location: [place];  Service: General;  Laterality: N/A;  ? ?Social History  ? ?Occupational History  ? Occupation: Education officer, museum  ?Tobacco Use  ? Smoking status: Former  ?  Packs/day: 0.25  ?  Years: 15.00  ?  Pack years: 3.75  ?  Types: Cigarettes  ?  Quit date: 06/22/1978  ?  Years since quitting: 43.2  ? Smokeless tobacco: Never  ?Vaping Use  ? Vaping Use: Never used  ?Substance and Sexual Activity  ? Alcohol use: No  ?  Alcohol/week: 0.0 standard drinks  ? Drug use: No  ? Sexual activity: Not on file  ? ? ? ? ? ? ?

## 2021-09-17 ENCOUNTER — Ambulatory Visit: Payer: BC Managed Care – PPO | Admitting: Podiatry

## 2021-09-24 ENCOUNTER — Other Ambulatory Visit: Payer: Self-pay

## 2021-09-24 ENCOUNTER — Encounter: Payer: Self-pay | Admitting: Orthopaedic Surgery

## 2021-09-24 ENCOUNTER — Ambulatory Visit: Payer: BC Managed Care – PPO | Admitting: Orthopaedic Surgery

## 2021-09-24 DIAGNOSIS — Z96641 Presence of right artificial hip joint: Secondary | ICD-10-CM | POA: Diagnosis not present

## 2021-09-24 DIAGNOSIS — M25551 Pain in right hip: Secondary | ICD-10-CM

## 2021-09-24 DIAGNOSIS — M7061 Trochanteric bursitis, right hip: Secondary | ICD-10-CM

## 2021-09-24 NOTE — Progress Notes (Signed)
The patient comes in continuing to deal with right hip pain.  We replaced her hip several years ago.  When I saw her last the x-rays looked okay of the right hip with no complicating features and her pain was over the trochanteric area.  I did provide a steroid injection over the point of maximum tenderness and she said it did help for several days but the pain is come back.  She is walking without a limp. ? ?On exam her right total hip moves smoothly and normally with no pain in the groin at all.  Her pain is to palpation of the trochanteric area. ? ?My next step is wanting to send her to outpatient physical therapy for any modalities that therapist can try to help decrease her right hip pain over the trochanteric area including even dry needling if needed and recommended.  Any modalities would be helpful.  I will then see her back about 6 weeks to see how she is done from a physical therapy standpoint. ?

## 2021-10-06 NOTE — Therapy (Incomplete)
?OUTPATIENT PHYSICAL THERAPY LOWER EXTREMITY EVALUATION ? ? ?Patient Name: [name] ?MRN: [id number] ?DOB:1956-01-29, 66 y.o., female ?Today's Date: 10/06/2021 ? ? ? ?Past Medical History:  ?Diagnosis Date  ? Anemia 2006  ? required transfusion post TAH/BSO 05/2005  ? Arthritis   ? Asthma   ? Chronic cough   ? Colon polyps   ? hyperplastic  ? Diabetes mellitus without complication ([place])   ? Diverticulosis   ? GERD (gastroesophageal reflux disease)   ? on prilosec  ? GLA deficiency ([place])   ? Glaucoma of both eyes   ? Hypertension   ? Joint pain   ? Osteoarthritis   ? Pancreatitis   ? Shortness of breath dyspnea   ? Vitamin D deficiency   ? ?Past Surgical History:  ?Procedure Laterality Date  ? ABDOMINAL HYSTERECTOMY    ? BREAST EXCISIONAL BIOPSY Right 1990  ? COLONOSCOPY N/A 08/22/2013  ? Procedure: COLONOSCOPY;  Surgeon: Irene Shipper, MD;  Location: [facility] ENDOSCOPY;  Service: Endoscopy;  Laterality: N/A;  ? ESOPHAGOGASTRODUODENOSCOPY N/A 08/22/2013  ? Procedure: ESOPHAGOGASTRODUODENOSCOPY (EGD);  Surgeon: Irene Shipper, MD;  Location: [facility] ENDOSCOPY;  Service: Endoscopy;  Laterality: N/A;  ? ESOPHAGOGASTRODUODENOSCOPY (EGD) WITH PROPOFOL N/A 08/19/2015  ? Procedure: ESOPHAGOGASTRODUODENOSCOPY (EGD) WITH PROPOFOL;  Surgeon: Irene Shipper, MD;  Location: [facility] ENDOSCOPY;  Service: Endoscopy;  Laterality: N/A;  ? HERNIA REPAIR    ? HYSTEROSCOPY WITH D & C  01/2005  ? for uterine fibroids.   ? INSERTION OF MESH N/A 08/24/2013  ? Procedure: INSERTION OF MESH;  Surgeon: Edward Jolly, MD;  Location: [place];  Service: General;  Laterality: N/A;  ? JOINT REPLACEMENT Right   ? LIPOMA EXCISION Left 06/21/2015  ? Procedure: EXCISION OF LEFT SCALP LIPOMA;  Surgeon: Johnathan Hausen, MD;  Location: [place];  Service: General;  Laterality: Left;  ? PANNICULECTOMY N/A 08/24/2013  ? Procedure: PANNICULECTOMY;  Surgeon: Edward Jolly, MD;  Location: [place];  Service: General;  Laterality: N/A;  ? TOTAL ABDOMINAL  HYSTERECTOMY W/ BILATERAL SALPINGOOPHORECTOMY  05/2005  ? TOTAL HIP ARTHROPLASTY Right 12/12/2013  ? Procedure: RIGHT TOTAL HIP ARTHROPLASTY ANTERIOR APPROACH;  Surgeon: Mcarthur Rossetti, MD;  Location: [place];  Service: Orthopedics;  Laterality: Right;  ? VENTRAL HERNIA REPAIR N/A 08/24/2013  ? Procedure: HERNIA REPAIR VENTRAL ADULT;  Surgeon: Edward Jolly, MD;  Location: [place];  Service: General;  Laterality: N/A;  ? ?Patient Active Problem List  ? Diagnosis Date Noted  ? Asthma exacerbation 09/19/2020  ? Anemia 10/13/2019  ? Glaucoma 10/13/2019  ? Hypertensive disorder 10/13/2019  ? Uterine leiomyoma 10/13/2019  ? Vitamin D insufficiency 04/26/2019  ? TIA (transient ischemic attack) 03/30/2019  ? Hyperlipidemia 03/30/2019  ? Thyroid nodule 03/30/2019  ? Stroke-like episode s/p IV tPA 03/28/2019  ? GERD (gastroesophageal reflux disease) 06/19/2018  ? Constipation 06/19/2018  ? Other fatigue 08/02/2017  ? Shortness of breath on exertion 08/02/2017  ? Mass of axilla 07/07/2017  ? Abdominal pain, epigastric   ? Abnormal CT of the abdomen   ? Pancreatitis, acute   ? Acute pancreatitis 08/13/2015  ? UTI (lower urinary tract infection) 08/13/2015  ? Severe obesity (BMI >= 40) ([place]) 01/23/2015  ? Sinusitis, chronic 01/08/2015  ? Cough variant asthma 01/03/2015  ? Arthritis of right hip 12/12/2013  ? 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  ? Benign neoplasm of colon 08/22/2013  ? Special screening for malignant neoplasms, colon 08/22/2013  ? Abdominal pain  08/19/2013  ? Diabetes mellitus without complication ([place]) 62/26/3335  ? Essential hypertension, benign 08/19/2013  ? Ventral hernia 08/19/2013  ? ? ?PCP: Antony Contras, MD ? ?REFERRING PROVIDER: Mcarthur Rossetti* ? ?REFERRING DIAG: M25.551 (ICD-10-CM) - Pain in right hip ?M70.61 (ICD-10-CM) - Trochanteric bursitis, right hip ? ?THERAPY DIAG:  ?No diagnosis found. ? ?ONSET DATE: *** ? ?SUBJECTIVE:  ? ?SUBJECTIVE STATEMENT: ?*** ? ?PERTINENT  HISTORY: ?*** ? ?PAIN:  ?Are you having pain? {OPRCPAIN:27236} ? ?PRECAUTIONS: None ? ?WEIGHT BEARING RESTRICTIONS No ? ?FALLS:  ?Has patient fallen in last 6 months? No ? ?LIVING ENVIRONMENT: ?Lives with: {OPRC lives with:25569::"lives with their family"} ?Lives in: {Lives in:25570} ?Stairs: {opstairs:27293} ?Has following equipment at home: {Assistive devices:23999} ? ?OCCUPATION: *** ? ?PLOF: {PLOF:24004} ? ?PATIENT GOALS *** ? ? ?OBJECTIVE:  ? ?DIAGNOSTIC FINDINGS: *** ? ?PATIENT SURVEYS:  ?{rehab surveys:24030} ? ?COGNITION: ? Overall cognitive status: {cognition:24006}   ?  ?SENSATION: ?{sensation:27233} ? ?MUSCLE LENGTH: ?Hamstrings: Right *** deg; Left *** deg ?Thomas test: Right *** deg; Left *** deg ? ?PALPATION: ?*** ? ?LE ROM: ? ?Active ROM Right ?10/07/2021 Left ?10/07/2021  ?Hip flexion    ?Hip extension    ?Hip abduction    ?Hip adduction    ?Hip internal rotation    ?Hip external rotation    ?Knee flexion    ?Knee extension    ?Ankle dorsiflexion    ?Ankle plantarflexion    ?Ankle inversion    ?Ankle eversion    ? (Blank rows = not tested) ? ?LE MMT: ? ?MMT Right ?10/07/2021 Left ?10/07/2021  ?Hip flexion    ?Hip extension    ?Hip abduction    ?Hip adduction    ?Hip internal rotation    ?Hip external rotation    ?Knee flexion    ?Knee extension    ?Ankle dorsiflexion    ?Ankle plantarflexion    ?Ankle inversion    ?Ankle eversion    ? (Blank rows = not tested) ? ?LOWER EXTREMITY SPECIAL TESTS:  ?{LEspecialtests:26242} ? ?FUNCTIONAL TESTS:  ?{Functional tests:24029} ? ?GAIT: ?Distance walked: *** ?Assistive device utilized: {Assistive devices:23999} ?Level of assistance: {Levels of assistance:24026} ?Comments: *** ? ? ? ?TODAY'S TREATMENT: ?HEP instruction/performance c cues for techniques, handout provided.  Trial set performed of each for comprehension and symptom assessment.  See below for exercise list. ? ? ? ? ? ? ?PATIENT EDUCATION:  ?Education details: PT POC< HEP ?Person educated:  Patient ?Education method: Explanation, Demonstration, Tactile cues, Verbal cues, and Handouts ?Education comprehension: verbalized understanding and returned demonstration ? ? ?HOME EXERCISE PROGRAM: ? ?*** ? ?ASSESSMENT: ? ?CLINICAL IMPRESSION: ?Patient is a 66 y.o. who comes to clinic with complaints of Rt hip pain with mobility, strength and movement coordination deficits that impair their ability to perform usual daily and recreational functional activities without increase difficulty/symptoms at this time.  Pt dx with trochanteric bursitis of Rt hip. Patient to benefit from skilled PT services to address impairments and limitations to improve to previous level of function without restriction secondary to condition.  ? ? ?OBJECTIVE IMPAIRMENTS decreased balance, decreased mobility, difficulty walking, decreased ROM, decreased strength, impaired flexibility, and pain.  ? ?ACTIVITY LIMITATIONS community activity.  ? ?PERSONAL FACTORS {Personal factors:25162} are also affecting patient's functional outcome.  ? ? ?REHAB POTENTIAL: Good ? ?CLINICAL DECISION MAKING: Stable/uncomplicated ? ?EVALUATION COMPLEXITY: Low ? ? ?GOALS: ?Goals reviewed with patient? Yes ?Short term PT Goals (target date for Short term goals are *** weeks ***) ?Patient will demonstrate independent use of  home exercise program to maintain progress from in clinic treatments. ?Goal status: New ?  ?Long term PT goals (target dates for all long term goals are *** weeks  *** ) ?Patient will demonstrate/report pain at worst less than or equal to 2/10 to facilitate minimal limitation in daily activity secondary to pain symptoms. ?Goal status: New ? ?Patient will demonstrate independent use of home exercise program to facilitate ability to maintain/progress functional gains from skilled physical therapy services. ?Goal status: New ? ?Patient will demonstrate FOTO outcome > or = *** % to indicate reduced disability due to condition. ?Goal status:  New ? ?*** ?Goal status: New ? ?    5.  *** ?Goal status: New ? ? ?PLAN: ?PT FREQUENCY: {rehab frequency:25116} ? ?PT DURATION: {rehab duration:25117} ? ?PLANNED INTERVENTIONS:  ? ?Therapeutic exercises, Therapeutic activity, Neuro

## 2021-10-07 ENCOUNTER — Ambulatory Visit: Payer: BC Managed Care – PPO | Admitting: Physical Therapy

## 2021-11-05 ENCOUNTER — Ambulatory Visit: Payer: BC Managed Care – PPO | Admitting: Orthopaedic Surgery

## 2021-11-11 ENCOUNTER — Other Ambulatory Visit: Payer: Self-pay | Admitting: Pulmonary Disease

## 2021-11-11 DIAGNOSIS — J4530 Mild persistent asthma, uncomplicated: Secondary | ICD-10-CM

## 2021-11-11 NOTE — Telephone Encounter (Signed)
Refill sent for [facility] to [facility]: [phone]  Dose: 375 mg every 2 weeks  Last OV: 07/02/21 Provider: Dr. Silas Flood  Next OV: not scheduled, anticipated in July 2023  Knox Saliva, PharmD, MPH, BCPS Clinical Pharmacist (Rheumatology and Pulmonology)

## 2021-11-24 NOTE — Therapy (Signed)
OUTPATIENT PHYSICAL THERAPY LOWER EXTREMITY EVALUATION   Patient Name: [name] MRN: [id number] DOB:1956-03-11, 66 y.o., female Today's Date: 11/25/2021   PT End of Session - 11/25/21 0806     Visit Number 1    Number of Visits 13    Date for PT Re-Evaluation 01/09/22    Authorization Type BBS    PT Start Time 0801    PT Stop Time 0840    PT Time Calculation (min) 39 min    Activity Tolerance Patient tolerated treatment well    Behavior During Therapy [facility] for tasks assessed/performed             Past Medical History:  Diagnosis Date   Anemia 2006   required transfusion post TAH/BSO 05/2005   Arthritis    Asthma    Chronic cough    Colon polyps    hyperplastic   Diabetes mellitus without complication (H[facility])    Diverticulosis    GERD (gastroesophageal reflux disease)    on prilosec   GLA deficiency ([place])    Glaucoma of both eyes    Hypertension    Joint pain    Osteoarthritis    Pancreatitis    Shortness of breath dyspnea    Vitamin D deficiency    Past Surgical History:  Procedure Laterality Date   ABDOMINAL HYSTERECTOMY     BREAST EXCISIONAL BIOPSY Right 1990   COLONOSCOPY N/A 08/22/2013   Procedure: COLONOSCOPY;  Surgeon: Irene Shipper, MD;  Location: [place];  Service: Endoscopy;  Laterality: N/A;   ESOPHAGOGASTRODUODENOSCOPY N/A 08/22/2013   Procedure: ESOPHAGOGASTRODUODENOSCOPY (EGD);  Surgeon: Irene Shipper, MD;  Location: [facility] ENDOSCOPY;  Service: Endoscopy;  Laterality: N/A;   ESOPHAGOGASTRODUODENOSCOPY (EGD) WITH PROPOFOL N/A 08/19/2015   Procedure: ESOPHAGOGASTRODUODENOSCOPY (EGD) WITH PROPOFOL;  Surgeon: Irene Shipper, MD;  Location: [facility] ENDOSCOPY;  Service: Endoscopy;  Laterality: N/A;   HERNIA REPAIR     HYSTEROSCOPY WITH D & C  01/2005   for uterine fibroids.    INSERTION OF MESH N/A 08/24/2013   Procedure: INSERTION OF MESH;  Surgeon: Edward Jolly, MD;  Location: [place];  Service: General;  Laterality: N/A;   JOINT REPLACEMENT Right     LIPOMA EXCISION Left 06/21/2015   Procedure: EXCISION OF LEFT SCALP LIPOMA;  Surgeon: Johnathan Hausen, MD;  Location: [place];  Service: General;  Laterality: Left;   PANNICULECTOMY N/A 08/24/2013   Procedure: PANNICULECTOMY;  Surgeon: Edward Jolly, MD;  Location: [place];  Service: General;  Laterality: N/A;   TOTAL ABDOMINAL HYSTERECTOMY W/ BILATERAL SALPINGOOPHORECTOMY  05/2005   TOTAL HIP ARTHROPLASTY Right 12/12/2013   Procedure: RIGHT TOTAL HIP ARTHROPLASTY ANTERIOR APPROACH;  Surgeon: Mcarthur Rossetti, MD;  Location: [place];  Service: Orthopedics;  Laterality: Right;   VENTRAL HERNIA REPAIR N/A 08/24/2013   Procedure: HERNIA REPAIR VENTRAL ADULT;  Surgeon: Edward Jolly, MD;  Location: [place];  Service: General;  Laterality: N/A;   Patient Active Problem List   Diagnosis Date Noted   Asthma exacerbation 09/19/2020   Anemia 10/13/2019   Glaucoma 10/13/2019   Hypertensive disorder 10/13/2019   Uterine leiomyoma 10/13/2019   Vitamin D insufficiency 04/26/2019   TIA (transient ischemic attack) 03/30/2019   Hyperlipidemia 03/30/2019   Thyroid nodule 03/30/2019   Stroke-like episode s/p IV tPA 03/28/2019   GERD (gastroesophageal reflux disease) 06/19/2018   Constipation 06/19/2018   Other fatigue 08/02/2017   Shortness of breath on exertion 08/02/2017   Mass of axilla 07/07/2017  Abdominal pain, epigastric    Abnormal CT of the abdomen    Pancreatitis, acute    Acute pancreatitis 08/13/2015   UTI (lower urinary tract infection) 08/13/2015   Severe obesity (BMI >= 40) (H[facility]) 01/23/2015   Sinusitis, chronic 01/08/2015   Cough variant asthma 01/03/2015   Arthritis of right hip 12/12/2013   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   Benign neoplasm of colon 08/22/2013   Special screening for malignant neoplasms, colon 08/22/2013   Abdominal pain 08/19/2013   Diabetes mellitus without complication ([place]) 42/59/5638   Essential hypertension,  benign 08/19/2013   Ventral hernia 08/19/2013    PCP: Antony Contras MD  REFERRING PROVIDER: Jean Rosenthal MD  REFERRING DIAG: [phone] (ICD-10-CM) - Pain in right hip M70.61 (ICD-10-CM) - Trochanteric bursitis, right hip   THERAPY DIAG:  Pain in right hip  Muscle weakness (generalized)  Difficulty in walking, not elsewhere classified  Rationale for Evaluation and Treatment Rehabilitation  ONSET DATE: 5 months ago  SUBJECTIVE:   SUBJECTIVE STATEMENT: Pt arriving to therapy reporting 3/10 pain at rest and with walking her pain increases to 6-7/10. Pt reporting limping when she walks. Pt stating her sleeping is disturbed because of her pain. Pt stating the injection in March helped some but not completely.   PERTINENT HISTORY:  Hip injection on 08/27/2021 PMH: asthma, arthritis, DM, GERD HTN, glaucoma both eyes, abdominal hysterectomy,   PAIN:  Are you having pain? Yes: NPRS scale: 3/10 Pain location: Rt hip, lateral and into Rt thigh Pain description: throbbing Aggravating factors: walking Relieving factors: resting, sitting  PRECAUTIONS: None  WEIGHT BEARING RESTRICTIONS No  FALLS:  Has patient fallen in last 6 months? No  LIVING ENVIRONMENT: Lives with: lives with their family Lives in: House/apartment Stairs: No only 2 small steps to enter Has following equipment at home: None  OCCUPATION: [place]: Independent  PATIENT GOALS: stop hurting, be able to walk straight with limping   OBJECTIVE:   DIAGNOSTIC FINDINGS:  An AP pelvis and lateral of the right hip shows a well-seated total hip arthroplasty with no gross complicating features.  PATIENT SURVEYS:  11/25/2021: FOTO 59% (predicted 64%)  COGNITION:  11/25/2021: Overall cognitive status: Within functional limits for tasks assessed     SENSATION: 11/25/2021: [facility]    MUSCLE LENGTH: Hamstrings: Right 80 deg; Left 86 deg Thomas test: Right 10 deg; Left 8 deg  POSTURE:  11/25/2021: rounded shoulders and forward head  PALPATION: 11/25/2021:   LOWER EXTREMITY ROM:  Active / Passive ROM Right 11/25/21 A/P Left 11/25/21 A/P  Hip flexion 95/110 105/120  Hip extension 8/15 10/15  Hip abduction 32/38 35/40  Hip adduction    Hip internal rotation 35/40 40/48  Hip external rotation 40/44 42/46  Knee flexion 128/ 132 126/130  Knee extension 0 0  Ankle dorsiflexion    Ankle plantarflexion    Ankle inversion    Ankle eversion     (Blank rows = not tested)  LOWER EXTREMITY MMT:  MMT Right eval Left eval  Hip flexion 4/5 4/5  Hip extension 5/5 5/5  Hip abduction 4/5 5/5  Hip adduction 4/5 5/5  Hip internal rotation    Hip external rotation    Knee flexion 5/5 5/5  Knee extension 5/5 5/5  Ankle dorsiflexion    Ankle plantarflexion    Ankle inversion    Ankle eversion     (Blank rows = not tested)  LOWER EXTREMITY SPECIAL TESTS:  Hip special tests: Saralyn Pilar (  FABER) test: negative Lateral greater trochanter tenderness/pain with compression in sidelying  FUNCTIONAL TESTS:  5 times sit to stand: 10 seconds with no UE support  GAIT: Distance walked: 30 feet  Assistive device utilized: None Level of assistance: Complete Independence Comments: pt with mild antalgic gait    TODAY'S TREATMENT: 11/25/2021: HEP instruction/performance c cues for techniques, handout provided.  Trial set performed of each for comprehension and symptom assessment.  See below for exercise list.   PATIENT EDUCATION:  Education details: PT POC, HEP Person educated: Patient Education method: Explanation, Demonstration, Tactile cues, Verbal cues, and Handouts Education comprehension: verbalized understanding, returned demonstration, and needs further education   HOME EXERCISE PROGRAM: Access Code: [email or web address] URL: [email or web address] Date: 11/25/2021 Prepared by: Kearney Hard  Exercises - Supine Bridge  - 2 x daily - 7 x weekly - 10 reps - 3 seconds  hold - Supine Piriformis Stretch with Foot on Ground  - 2 x daily - 7 x weekly - 3 reps - 20 seconds hold - Supine Figure 4 Piriformis Stretch  - 2 x daily - 7 x weekly - 3 reps - 20 seconds hold - Supine Lower Trunk Rotation  - 2 x daily - 7 x weekly - 3 reps - 20 seconds hold - Sidelying Hip Abduction  - 2 x daily - 7 x weekly - 2 sets - 10 reps - Standing Hip Flexor Stretch  - 2 x daily - 7 x weekly - 3 reps - 20 seconds hold  ASSESSMENT:  CLINICAL IMPRESSION: Patient is a 66 y.o. female who comes to clinic with complaints of Rt hip pain with dx of trochanteric bursitis. with mobility, strength and movement coordination deficits that impair their ability to perform usual daily and recreational functional activities without increase difficulty/symptoms.  Patient to benefit from skilled PT services to address impairments and limitations to improve to previous level of function without restriction secondary to condition.    OBJECTIVE IMPAIRMENTS decreased activity tolerance, decreased balance, decreased mobility, difficulty walking, decreased ROM, decreased strength, impaired flexibility, and pain.   ACTIVITY LIMITATIONS lifting, bending, sitting, sleeping, and stairs  PARTICIPATION LIMITATIONS: community activity  PERSONAL FACTORS asthma, arthritis, DM, GERD HTN, glaucoma both eyes, abdominal hysterectomy,  are also affecting patient's functional outcome.   REHAB POTENTIAL: Good  CLINICAL DECISION MAKING: Stable/uncomplicated  EVALUATION COMPLEXITY: Low   GOALS: Goals reviewed with patient? Yes  SHORT TERM GOALS: Target date: 12/16/2021  Patient will demonstrate independent use of initial home exercise program to maintain progress from in clinic treatments. Goal status: New   Long term PT goals: Target date 01/09/22 Patient will demonstrate/report pain at worst less than or equal to 2/10 to facilitate minimal limitation in daily activity secondary to pain symptoms. Goal status:  New   Patient will demonstrate independent use of home exercise program to facilitate ability to maintain/progress functional gains from skilled physical therapy services. Goal status: New   Patient will demonstrate FOTO outcome > or = 64 % to indicate reduced disability due to condition. Goal status: New   Pt will be able to walk for >/= 15 minutes with normalized gait with no pain reported.  Goal status: New       5.  Pt will improve her right hip flexion and abduction to 5/5 in order to improve functional mobility.   Goal status: New   PLAN: PT FREQUENCY: 1-2x/week  PT DURATION: 6 weeks  PLANNED INTERVENTIONS:   Therapeutic exercises, Therapeutic activity, Neuro Muscular re-education, Balance training, Gait  training, Patient/Family education, Joint mobilization, Stair training, DME instructions, Dry Needling, Electrical stimulation, Cryotherapy, Moist heat, Taping, Ultrasound, Ionotophoresis '4mg'$ /ml Dexamethasone, and Manual therapy.  All included unless contraindicated  PLAN FOR NEXT SESSION: Nustep, hip stretching, assess for DN hip musculature, hip strengthening, percussion and IASTM to lateral hip, IT band stretching   Oretha Caprice, PT, MPT 11/25/2021, 8:07 AM

## 2021-11-25 ENCOUNTER — Ambulatory Visit: Payer: BC Managed Care – PPO | Admitting: Physical Therapy

## 2021-11-25 ENCOUNTER — Encounter: Payer: Self-pay | Admitting: Physical Therapy

## 2021-11-25 DIAGNOSIS — M25551 Pain in right hip: Secondary | ICD-10-CM | POA: Diagnosis not present

## 2021-11-25 DIAGNOSIS — R262 Difficulty in walking, not elsewhere classified: Secondary | ICD-10-CM

## 2021-11-25 DIAGNOSIS — M6281 Muscle weakness (generalized): Secondary | ICD-10-CM

## 2021-12-03 ENCOUNTER — Encounter: Payer: Self-pay | Admitting: Physical Therapy

## 2021-12-03 ENCOUNTER — Ambulatory Visit: Payer: BC Managed Care – PPO | Admitting: Physical Therapy

## 2021-12-03 DIAGNOSIS — M25551 Pain in right hip: Secondary | ICD-10-CM | POA: Diagnosis not present

## 2021-12-03 DIAGNOSIS — M6281 Muscle weakness (generalized): Secondary | ICD-10-CM | POA: Diagnosis not present

## 2021-12-03 DIAGNOSIS — R262 Difficulty in walking, not elsewhere classified: Secondary | ICD-10-CM

## 2021-12-03 NOTE — Therapy (Signed)
OUTPATIENT PHYSICAL THERAPY TREATMENT NOTE   Patient Name: [name] MRN: [id number] DOB:06-May-1956, 66 y.o., female Today's Date: 12/03/2021  PCP: Antony Contras MD REFERRING PROVIDER: Jean Rosenthal MD  END OF SESSION:   PT End of Session - 12/03/21 1507     Visit Number 2    Number of Visits 13    Date for PT Re-Evaluation 01/09/22    Authorization Type BBS    PT Start Time 1430    PT Stop Time 1505    PT Time Calculation (min) 35 min    Activity Tolerance Patient tolerated treatment well    Behavior During Therapy [facility] for tasks assessed/performed             Past Medical History:  Diagnosis Date   Anemia 2006   required transfusion post TAH/BSO 05/2005   Arthritis    Asthma    Chronic cough    Colon polyps    hyperplastic   Diabetes mellitus without complication (HCC)    Diverticulosis    GERD (gastroesophageal reflux disease)    on prilosec   GLA deficiency ([place])    Glaucoma of both eyes    Hypertension    Joint pain    Osteoarthritis    Pancreatitis    Shortness of breath dyspnea    Vitamin D deficiency    Past Surgical History:  Procedure Laterality Date   ABDOMINAL HYSTERECTOMY     BREAST EXCISIONAL BIOPSY Right 1990   COLONOSCOPY N/A 08/22/2013   Procedure: COLONOSCOPY;  Surgeon: Irene Shipper, MD;  Location: [place];  Service: Endoscopy;  Laterality: N/A;   ESOPHAGOGASTRODUODENOSCOPY N/A 08/22/2013   Procedure: ESOPHAGOGASTRODUODENOSCOPY (EGD);  Surgeon: Irene Shipper, MD;  Location: [facility] ENDOSCOPY;  Service: Endoscopy;  Laterality: N/A;   ESOPHAGOGASTRODUODENOSCOPY (EGD) WITH PROPOFOL N/A 08/19/2015   Procedure: ESOPHAGOGASTRODUODENOSCOPY (EGD) WITH PROPOFOL;  Surgeon: Irene Shipper, MD;  Location: [facility] ENDOSCOPY;  Service: Endoscopy;  Laterality: N/A;   HERNIA REPAIR     HYSTEROSCOPY WITH D & C  01/2005   for uterine fibroids.    INSERTION OF MESH N/A 08/24/2013   Procedure: INSERTION OF MESH;  Surgeon: Edward Jolly, MD;  Location:  [place];  Service: General;  Laterality: N/A;   JOINT REPLACEMENT Right    LIPOMA EXCISION Left 06/21/2015   Procedure: EXCISION OF LEFT SCALP LIPOMA;  Surgeon: Johnathan Hausen, MD;  Location: [place];  Service: General;  Laterality: Left;   PANNICULECTOMY N/A 08/24/2013   Procedure: PANNICULECTOMY;  Surgeon: Edward Jolly, MD;  Location: [place];  Service: General;  Laterality: N/A;   TOTAL ABDOMINAL HYSTERECTOMY W/ BILATERAL SALPINGOOPHORECTOMY  05/2005   TOTAL HIP ARTHROPLASTY Right 12/12/2013   Procedure: RIGHT TOTAL HIP ARTHROPLASTY ANTERIOR APPROACH;  Surgeon: Mcarthur Rossetti, MD;  Location: [place];  Service: Orthopedics;  Laterality: Right;   VENTRAL HERNIA REPAIR N/A 08/24/2013   Procedure: HERNIA REPAIR VENTRAL ADULT;  Surgeon: Edward Jolly, MD;  Location: [place];  Service: General;  Laterality: N/A;   Patient Active Problem List   Diagnosis Date Noted   Asthma exacerbation 09/19/2020   Anemia 10/13/2019   Glaucoma 10/13/2019   Hypertensive disorder 10/13/2019   Uterine leiomyoma 10/13/2019   Vitamin D insufficiency 04/26/2019   TIA (transient ischemic attack) 03/30/2019   Hyperlipidemia 03/30/2019   Thyroid nodule 03/30/2019   Stroke-like episode s/p IV tPA 03/28/2019   GERD (gastroesophageal reflux disease) 06/19/2018   Constipation 06/19/2018   Other fatigue 08/02/2017  Shortness of breath on exertion 08/02/2017   Mass of axilla 07/07/2017   Abdominal pain, epigastric    Abnormal CT of the abdomen    Pancreatitis, acute    Acute pancreatitis 08/13/2015   UTI (lower urinary tract infection) 08/13/2015   Severe obesity (BMI >= 40) (HCC) 01/23/2015   Sinusitis, chronic 01/08/2015   Cough variant asthma 01/03/2015   Arthritis of right hip 12/12/2013   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   Benign neoplasm of colon 08/22/2013   Special screening for malignant neoplasms, colon 08/22/2013   Abdominal pain 08/19/2013   Diabetes  mellitus without complication ([place]) 02/54/2706   Essential hypertension, benign 08/19/2013   Ventral hernia 08/19/2013    REFERRING DIAG: M25.551 (ICD-10-CM) - Pain in right hip M70.61 (ICD-10-CM) - Trochanteric bursitis, right hip  THERAPY DIAG:  Pain in right hip  Muscle weakness (generalized)  Difficulty in walking, not elsewhere classified  Rationale for Evaluation and Treatment Rehabilitation  PERTINENT HISTORY:  Hip injection on 08/27/2021 PMH: asthma, arthritis, DM, GERD HTN, glaucoma both eyes, abdominal hysterectomy,   PRECAUTIONS: None   SUBJECTIVE: Hip is "the same, nothing's changed."  Stretches are helping to provide some short term relief  PAIN:  Are you having pain? Yes: NPRS scale: 3, up to 7/10 Pain location: Rt hip Pain description: sharp Aggravating factors: unknown Relieving factors: exercises   OBJECTIVE: (objective measures completed at initial evaluation unless otherwise dated)   PATIENT SURVEYS:  11/25/2021: FOTO 59% (predicted 64%)    MUSCLE LENGTH:   11/25/21:Hamstrings: Right 80 deg; Left 86 deg Thomas test: Right 10 deg; Left 8 deg    LOWER EXTREMITY ROM:   Active / Passive ROM Right 11/25/21 A/P Left 11/25/21 A/P  Hip flexion 95/110 105/120  Hip extension 8/15 10/15  Hip abduction 32/38 35/40  Hip adduction      Hip internal rotation 35/40 40/48  Hip external rotation 40/44 42/46  Knee flexion 128/ 132 126/130  Knee extension 0 0   (Blank rows = not tested)   LOWER EXTREMITY MMT:   MMT Right eval Left eval  Hip flexion 4/5 4/5  Hip extension 5/5 5/5  Hip abduction 4/5 5/5  Hip adduction 4/5 5/5  Hip internal rotation      Hip external rotation      Knee flexion 5/5 5/5  Knee extension 5/5 5/5   (Blank rows = not tested)   LOWER EXTREMITY SPECIAL TESTS:  Hip special tests: Saralyn Pilar (FABER) test: negative Lateral greater trochanter tenderness/pain with compression in sidelying   FUNCTIONAL TESTS:  11/25/21: 5 times sit to  stand: 10 seconds with no UE support   GAIT: Distance walked: 30 feet  Assistive device utilized: None Level of assistance: Complete Independence Comments: pt with mild antalgic gait       TODAY'S TREATMENT: 12/03/21 Therex:  NuStep L5 x 5 min Reviewed HEP and encouraged daily activity  Manual: STM with compression to Rt glutes and lateral hamstrings; skilled palpation and monitoring of soft tissue during DN  Trigger Point Dry-Needling  Treatment instructions: Expect mild to moderate muscle soreness. S/S of pneumothorax if dry needled over a lung field, and to seek immediate medical attention should they occur. Patient verbalized understanding of these instructions and education.  Patient Consent Given: Yes Education handout provided: Yes Muscles treated: Rt glute med, min, lateral hamstring Electrical stimulation performed: No Parameters: N/A Treatment response/outcome: twitch responses with decreased pain reported   11/25/2021: HEP instruction/performance c cues for techniques, handout  provided.  Trial set performed of each for comprehension and symptom assessment.  See below for exercise list.     PATIENT EDUCATION:  Education details: PT POC, HEP Person educated: Patient Education method: Explanation, Demonstration, Tactile cues, Verbal cues, and Handouts Education comprehension: verbalized understanding, returned demonstration, and needs further education     HOME EXERCISE PROGRAM: Access Code: [email or web address] URL: [email or web address] Date: 11/25/2021 Prepared by: Kearney Hard   Exercises - Supine Bridge  - 2 x daily - 7 x weekly - 10 reps - 3 seconds hold - Supine Piriformis Stretch with Foot on Ground  - 2 x daily - 7 x weekly - 3 reps - 20 seconds hold - Supine Figure 4 Piriformis Stretch  - 2 x daily - 7 x weekly - 3 reps - 20 seconds hold - Supine Lower Trunk Rotation  - 2 x daily - 7 x weekly - 3 reps - 20 seconds hold - Sidelying Hip Abduction   - 2 x daily - 7 x weekly - 2 sets - 10 reps - Standing Hip Flexor Stretch  - 2 x daily - 7 x weekly - 3 reps - 20 seconds hold   ASSESSMENT:   CLINICAL IMPRESSION: Trial of DN and manual therapy today to trigger points, with pt reporting decreased pain after session.  At this time all goals ongoing as pt reporting no change in symptoms.  Will continue to benefit from PT to maximize function.   OBJECTIVE IMPAIRMENTS decreased activity tolerance, decreased balance, decreased mobility, difficulty walking, decreased ROM, decreased strength, impaired flexibility, and pain.    ACTIVITY LIMITATIONS lifting, bending, sitting, sleeping, and stairs   PARTICIPATION LIMITATIONS: community activity   PERSONAL FACTORS asthma, arthritis, DM, GERD HTN, glaucoma both eyes, abdominal hysterectomy,  are also affecting patient's functional outcome.    REHAB POTENTIAL: Good   CLINICAL DECISION MAKING: Stable/uncomplicated   EVALUATION COMPLEXITY: Low     GOALS: Goals reviewed with patient? Yes   SHORT TERM GOALS: Target date: 12/16/2021  Patient will demonstrate independent use of initial home exercise program to maintain progress from in clinic treatments. Goal status: New   Long term PT goals: Target date 01/09/22 Patient will demonstrate/report pain at worst less than or equal to 2/10 to facilitate minimal limitation in daily activity secondary to pain symptoms. Goal status: New   Patient will demonstrate independent use of home exercise program to facilitate ability to maintain/progress functional gains from skilled physical therapy services. Goal status: New   Patient will demonstrate FOTO outcome > or = 64 % to indicate reduced disability due to condition. Goal status: New   Pt will be able to walk for >/= 15 minutes with normalized gait with no pain reported.  Goal status: New       5.  Pt will improve her right hip flexion and abduction to 5/5 in order to improve functional mobility.    Goal status: New     PLAN: PT FREQUENCY: 1-2x/week   PT DURATION: 6 weeks   PLANNED INTERVENTIONS:   Therapeutic exercises, Therapeutic activity, Neuro Muscular re-education, Balance training, Gait training, Patient/Family education, Joint mobilization, Stair training, DME instructions, Dry Needling, Electrical stimulation, Cryotherapy, Moist heat, Taping, Ultrasound, Ionotophoresis '4mg'$ /ml Dexamethasone, and Manual therapy.  All included unless contraindicated   PLAN FOR NEXT SESSION: check STGs,  Nustep, hip stretching, assess response to DN, hip strengthening, percussion and IASTM to lateral hip, IT band stretching      Laureen Abrahams, PT, DPT 12/03/21 3:07 PM

## 2021-12-08 ENCOUNTER — Ambulatory Visit (INDEPENDENT_AMBULATORY_CARE_PROVIDER_SITE_OTHER): Payer: BC Managed Care – PPO | Admitting: Physical Therapy

## 2021-12-08 ENCOUNTER — Encounter: Payer: Self-pay | Admitting: Physical Therapy

## 2021-12-08 DIAGNOSIS — M25551 Pain in right hip: Secondary | ICD-10-CM

## 2021-12-08 DIAGNOSIS — R262 Difficulty in walking, not elsewhere classified: Secondary | ICD-10-CM | POA: Diagnosis not present

## 2021-12-08 DIAGNOSIS — M6281 Muscle weakness (generalized): Secondary | ICD-10-CM | POA: Diagnosis not present

## 2021-12-08 NOTE — Therapy (Signed)
OUTPATIENT PHYSICAL THERAPY TREATMENT NOTE   Patient Name: [name] MRN: [id number] DOB:1956/02/09, 66 y.o., female Today's Date: 12/08/2021  PCP: Antony Contras MD REFERRING PROVIDER: Jean Rosenthal MD  END OF SESSION:   PT End of Session - 12/08/21 1050     Visit Number 3    Number of Visits 13    Date for PT Re-Evaluation 01/09/22    Authorization Type BBS    PT Start Time 1052    PT Stop Time 1132    PT Time Calculation (min) 40 min    Activity Tolerance Patient tolerated treatment well    Behavior During Therapy [facility] for tasks assessed/performed              Past Medical History:  Diagnosis Date   Anemia 2006   required transfusion post TAH/BSO 05/2005   Arthritis    Asthma    Chronic cough    Colon polyps    hyperplastic   Diabetes mellitus without complication (HCC)    Diverticulosis    GERD (gastroesophageal reflux disease)    on prilosec   GLA deficiency ([place])    Glaucoma of both eyes    Hypertension    Joint pain    Osteoarthritis    Pancreatitis    Shortness of breath dyspnea    Vitamin D deficiency    Past Surgical History:  Procedure Laterality Date   ABDOMINAL HYSTERECTOMY     BREAST EXCISIONAL BIOPSY Right 1990   COLONOSCOPY N/A 08/22/2013   Procedure: COLONOSCOPY;  Surgeon: Irene Shipper, MD;  Location: [place];  Service: Endoscopy;  Laterality: N/A;   ESOPHAGOGASTRODUODENOSCOPY N/A 08/22/2013   Procedure: ESOPHAGOGASTRODUODENOSCOPY (EGD);  Surgeon: Irene Shipper, MD;  Location: [facility] ENDOSCOPY;  Service: Endoscopy;  Laterality: N/A;   ESOPHAGOGASTRODUODENOSCOPY (EGD) WITH PROPOFOL N/A 08/19/2015   Procedure: ESOPHAGOGASTRODUODENOSCOPY (EGD) WITH PROPOFOL;  Surgeon: Irene Shipper, MD;  Location: [facility] ENDOSCOPY;  Service: Endoscopy;  Laterality: N/A;   HERNIA REPAIR     HYSTEROSCOPY WITH D & C  01/2005   for uterine fibroids.    INSERTION OF MESH N/A 08/24/2013   Procedure: INSERTION OF MESH;  Surgeon: Edward Jolly, MD;   Location: [place];  Service: General;  Laterality: N/A;   JOINT REPLACEMENT Right    LIPOMA EXCISION Left 06/21/2015   Procedure: EXCISION OF LEFT SCALP LIPOMA;  Surgeon: Johnathan Hausen, MD;  Location: [place];  Service: General;  Laterality: Left;   PANNICULECTOMY N/A 08/24/2013   Procedure: PANNICULECTOMY;  Surgeon: Edward Jolly, MD;  Location: [place];  Service: General;  Laterality: N/A;   TOTAL ABDOMINAL HYSTERECTOMY W/ BILATERAL SALPINGOOPHORECTOMY  05/2005   TOTAL HIP ARTHROPLASTY Right 12/12/2013   Procedure: RIGHT TOTAL HIP ARTHROPLASTY ANTERIOR APPROACH;  Surgeon: Mcarthur Rossetti, MD;  Location: [place];  Service: Orthopedics;  Laterality: Right;   VENTRAL HERNIA REPAIR N/A 08/24/2013   Procedure: HERNIA REPAIR VENTRAL ADULT;  Surgeon: Edward Jolly, MD;  Location: [place];  Service: General;  Laterality: N/A;   Patient Active Problem List   Diagnosis Date Noted   Asthma exacerbation 09/19/2020   Anemia 10/13/2019   Glaucoma 10/13/2019   Hypertensive disorder 10/13/2019   Uterine leiomyoma 10/13/2019   Vitamin D insufficiency 04/26/2019   TIA (transient ischemic attack) 03/30/2019   Hyperlipidemia 03/30/2019   Thyroid nodule 03/30/2019   Stroke-like episode s/p IV tPA 03/28/2019   GERD (gastroesophageal reflux disease) 06/19/2018   Constipation 06/19/2018   Other fatigue 08/02/2017  Shortness of breath on exertion 08/02/2017   Mass of axilla 07/07/2017   Abdominal pain, epigastric    Abnormal CT of the abdomen    Pancreatitis, acute    Acute pancreatitis 08/13/2015   UTI (lower urinary tract infection) 08/13/2015   Severe obesity (BMI >= 40) (HCC) 01/23/2015   Sinusitis, chronic 01/08/2015   Cough variant asthma 01/03/2015   Arthritis of right hip 12/12/2013   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   Benign neoplasm of colon 08/22/2013   Special screening for malignant neoplasms, colon 08/22/2013   Abdominal pain 08/19/2013    Diabetes mellitus without complication ([place]) 38/88/7579   Essential hypertension, benign 08/19/2013   Ventral hernia 08/19/2013    REFERRING DIAG: M25.551 (ICD-10-CM) - Pain in right hip M70.61 (ICD-10-CM) - Trochanteric bursitis, right hip  THERAPY DIAG:  Pain in right hip  Muscle weakness (generalized)  Difficulty in walking, not elsewhere classified  Rationale for Evaluation and Treatment Rehabilitation  PERTINENT HISTORY:  Hip injection on 08/27/2021 PMH: asthma, arthritis, DM, GERD HTN, glaucoma both eyes, abdominal hysterectomy,   PRECAUTIONS: None   SUBJECTIVE: Pt stating she was sore following the needling last visit but was able to sleep without waking up on the 2nd night.   PAIN:  Are you having pain? Yes: NPRS scale: 3-4/10 Pain location: Rt hip Pain description: sharp Aggravating factors: unknown Relieving factors: exercises   OBJECTIVE: (objective measures completed at initial evaluation unless otherwise dated)   PATIENT SURVEYS:  11/25/2021: FOTO 59% (predicted 64%)    MUSCLE LENGTH:   11/25/21:Hamstrings: Right 80 deg; Left 86 deg Thomas test: Right 10 deg; Left 8 deg    LOWER EXTREMITY ROM:   Active / Passive ROM Right 11/25/21 A/P Left 11/25/21 A/P  Hip flexion 95/110 105/120  Hip extension 8/15 10/15  Hip abduction 32/38 35/40  Hip adduction      Hip internal rotation 35/40 40/48  Hip external rotation 40/44 42/46  Knee flexion 128/ 132 126/130  Knee extension 0 0   (Blank rows = not tested)   LOWER EXTREMITY MMT:   MMT Right eval Left eval  Hip flexion 4/5 4/5  Hip extension 5/5 5/5  Hip abduction 4/5 5/5  Hip adduction 4/5 5/5  Hip internal rotation      Hip external rotation      Knee flexion 5/5 5/5  Knee extension 5/5 5/5   (Blank rows = not tested)   LOWER EXTREMITY SPECIAL TESTS:  Hip special tests: Saralyn Pilar (FABER) test: negative Lateral greater trochanter tenderness/pain with compression in sidelying   FUNCTIONAL TESTS:   11/25/21: 5 times sit to stand: 10 seconds with no UE support   GAIT: Distance walked: 30 feet  Assistive device utilized: None Level of assistance: Complete Independence Comments: pt with mild antalgic gait       TODAY'S TREATMENT: 12/08/21 Therex: NuStep L5 x 8 min Piriformis stretch: bilateral x 2 holding 30 sec Lunge stretch with foot on 6 inch step holding 10 sec x 5 each LE Hip hike x 10 each LE Hip abduction 2 x 10 each LE, UE support Hip extension 2 x 10 each LE, UE support Seated hamstring stretch x 2 bilateral LE's holding 30 sec Leg Press 50 # 3 x 10 bilateral LEs  Manual: STM with compression to Rt glutes and lateral hamstrings; skilled palpation and monitoring of soft tissue during DN  Trigger Point Dry-Needling  Treatment instructions: Expect mild to moderate muscle soreness. S/S of pneumothorax if dry  needled over a lung field, and to seek immediate medical attention should they occur. Patient verbalized understanding of these instructions and education.  Patient Consent Given: Yes Education handout provided: Yes Muscles treated: Rt glute med, min, lateral hamstring Electrical stimulation performed: No Parameters: N/A Treatment response/outcome: twitch responses with decreased pain reported   12/03/21 Therex:  NuStep L5 x 5 min Reviewed HEP and encouraged daily activity  Manual: STM with compression to Rt glutes and lateral hamstrings; skilled palpation and monitoring of soft tissue during DN  Trigger Point Dry-Needling  Treatment instructions: Expect mild to moderate muscle soreness. S/S of pneumothorax if dry needled over a lung field, and to seek immediate medical attention should they occur. Patient verbalized understanding of these instructions and education.  Patient Consent Given: Yes Education handout provided: Yes Muscles treated: bilateral lateral hamstrings, TFL bilateral  Electrical stimulation performed: No Parameters: N/A Treatment  response/outcome: twitch responses with decreased pain reported        PATIENT EDUCATION:  Education details: PT POC, HEP Person educated: Patient Education method: Consulting civil engineer, Demonstration, Corporate treasurer cues, Verbal cues, and Handouts Education comprehension: verbalized understanding, returned demonstration, and needs further education     HOME EXERCISE PROGRAM: Access Code: [email or web address] URL: [email or web address] Date: 11/25/2021 Prepared by: Kearney Hard   Exercises - Supine Bridge  - 2 x daily - 7 x weekly - 10 reps - 3 seconds hold - Supine Piriformis Stretch with Foot on Ground  - 2 x daily - 7 x weekly - 3 reps - 20 seconds hold - Supine Figure 4 Piriformis Stretch  - 2 x daily - 7 x weekly - 3 reps - 20 seconds hold - Supine Lower Trunk Rotation  - 2 x daily - 7 x weekly - 3 reps - 20 seconds hold - Sidelying Hip Abduction  - 2 x daily - 7 x weekly - 2 sets - 10 reps - Standing Hip Flexor Stretch  - 2 x daily - 7 x weekly - 3 reps - 20 seconds hold   ASSESSMENT:   CLINICAL IMPRESSION: TPDN performed again after pt reporting good response following last session with better sleep. Pt still presenting with antalgic gait and pain of 3-4/10.  Continue with skilled PT to progress toward maximizal functional mobility.    OBJECTIVE IMPAIRMENTS decreased activity tolerance, decreased balance, decreased mobility, difficulty walking, decreased ROM, decreased strength, impaired flexibility, and pain.    ACTIVITY LIMITATIONS lifting, bending, sitting, sleeping, and stairs   PARTICIPATION LIMITATIONS: community activity   PERSONAL FACTORS asthma, arthritis, DM, GERD HTN, glaucoma both eyes, abdominal hysterectomy,  are also affecting patient's functional outcome.    REHAB POTENTIAL: Good   CLINICAL DECISION MAKING: Stable/uncomplicated   EVALUATION COMPLEXITY: Low     GOALS: Goals reviewed with patient? Yes   SHORT TERM GOALS: Target date: 12/16/2021  Patient  will demonstrate independent use of initial home exercise program to maintain progress from in clinic treatments. Goal status: Met 12/08/2021   Long term PT goals: Target date 01/09/22 Patient will demonstrate/report pain at worst less than or equal to 2/10 to facilitate minimal limitation in daily activity secondary to pain symptoms. Goal status: New   Patient will demonstrate independent use of home exercise program to facilitate ability to maintain/progress functional gains from skilled physical therapy services. Goal status: New   Patient will demonstrate FOTO outcome > or = 64 % to indicate reduced disability due to condition. Goal status: New   Pt will be able to walk for >/= 15  minutes with normalized gait with no pain reported.  Goal status: New       5.  Pt will improve her right hip flexion and abduction to 5/5 in order to improve functional mobility.   Goal status: New     PLAN: PT FREQUENCY: 1-2x/week   PT DURATION: 6 weeks   PLANNED INTERVENTIONS:   Therapeutic exercises, Therapeutic activity, Neuro Muscular re-education, Balance training, Gait training, Patient/Family education, Joint mobilization, Stair training, DME instructions, Dry Needling, Electrical stimulation, Cryotherapy, Moist heat, Taping, Ultrasound, Ionotophoresis 62m/ml Dexamethasone, and Manual therapy.  All included unless contraindicated   PLAN FOR NEXT SESSION: Assess response to DN, Nustep, hip stretching, hip strengthening, IT band stretching      JKearney Hard PT, MPT 12/08/21 10:55 AM   12/08/21 10:55 AM

## 2021-12-15 ENCOUNTER — Encounter: Payer: BC Managed Care – PPO | Admitting: Physical Therapy

## 2021-12-15 ENCOUNTER — Telehealth: Payer: Self-pay | Admitting: Physical Therapy

## 2021-12-15 NOTE — Therapy (Deleted)
OUTPATIENT PHYSICAL THERAPY TREATMENT NOTE   Patient Name: [name] MRN: [id number] DOB:Sep 22, 1955, 66 y.o., female Today's Date: 12/15/2021  PCP: Antony Contras MD REFERRING PROVIDER: Jean Rosenthal MD  END OF SESSION:      Past Medical History:  Diagnosis Date   Anemia 2006   required transfusion post TAH/BSO 05/2005   Arthritis    Asthma    Chronic cough    Colon polyps    hyperplastic   Diabetes mellitus without complication ([place])    Diverticulosis    GERD (gastroesophageal reflux disease)    on prilosec   GLA deficiency ([place])    Glaucoma of both eyes    Hypertension    Joint pain    Osteoarthritis    Pancreatitis    Shortness of breath dyspnea    Vitamin D deficiency    Past Surgical History:  Procedure Laterality Date   ABDOMINAL HYSTERECTOMY     BREAST EXCISIONAL BIOPSY Right 1990   COLONOSCOPY N/A 08/22/2013   Procedure: COLONOSCOPY;  Surgeon: Irene Shipper, MD;  Location: [place];  Service: Endoscopy;  Laterality: N/A;   ESOPHAGOGASTRODUODENOSCOPY N/A 08/22/2013   Procedure: ESOPHAGOGASTRODUODENOSCOPY (EGD);  Surgeon: Irene Shipper, MD;  Location: [facility] ENDOSCOPY;  Service: Endoscopy;  Laterality: N/A;   ESOPHAGOGASTRODUODENOSCOPY (EGD) WITH PROPOFOL N/A 08/19/2015   Procedure: ESOPHAGOGASTRODUODENOSCOPY (EGD) WITH PROPOFOL;  Surgeon: Irene Shipper, MD;  Location: [facility] ENDOSCOPY;  Service: Endoscopy;  Laterality: N/A;   HERNIA REPAIR     HYSTEROSCOPY WITH D & C  01/2005   for uterine fibroids.    INSERTION OF MESH N/A 08/24/2013   Procedure: INSERTION OF MESH;  Surgeon: Edward Jolly, MD;  Location: [place];  Service: General;  Laterality: N/A;   JOINT REPLACEMENT Right    LIPOMA EXCISION Left 06/21/2015   Procedure: EXCISION OF LEFT SCALP LIPOMA;  Surgeon: Johnathan Hausen, MD;  Location: [place];  Service: General;  Laterality: Left;   PANNICULECTOMY N/A 08/24/2013   Procedure: PANNICULECTOMY;  Surgeon: Edward Jolly, MD;   Location: [place];  Service: General;  Laterality: N/A;   TOTAL ABDOMINAL HYSTERECTOMY W/ BILATERAL SALPINGOOPHORECTOMY  05/2005   TOTAL HIP ARTHROPLASTY Right 12/12/2013   Procedure: RIGHT TOTAL HIP ARTHROPLASTY ANTERIOR APPROACH;  Surgeon: Mcarthur Rossetti, MD;  Location: [place];  Service: Orthopedics;  Laterality: Right;   VENTRAL HERNIA REPAIR N/A 08/24/2013   Procedure: HERNIA REPAIR VENTRAL ADULT;  Surgeon: Edward Jolly, MD;  Location: [place];  Service: General;  Laterality: N/A;   Patient Active Problem List   Diagnosis Date Noted   Asthma exacerbation 09/19/2020   Anemia 10/13/2019   Glaucoma 10/13/2019   Hypertensive disorder 10/13/2019   Uterine leiomyoma 10/13/2019   Vitamin D insufficiency 04/26/2019   TIA (transient ischemic attack) 03/30/2019   Hyperlipidemia 03/30/2019   Thyroid nodule 03/30/2019   Stroke-like episode s/p IV tPA 03/28/2019   GERD (gastroesophageal reflux disease) 06/19/2018   Constipation 06/19/2018   Other fatigue 08/02/2017   Shortness of breath on exertion 08/02/2017   Mass of axilla 07/07/2017   Abdominal pain, epigastric    Abnormal CT of the abdomen    Pancreatitis, acute    Acute pancreatitis 08/13/2015   UTI (lower urinary tract infection) 08/13/2015   Severe obesity (BMI >= 40) (HCC) 01/23/2015   Sinusitis, chronic 01/08/2015   Cough variant asthma 01/03/2015   Arthritis of right hip 12/12/2013   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   Benign neoplasm of  colon 08/22/2013   Special screening for malignant neoplasms, colon 08/22/2013   Abdominal pain 08/19/2013   Diabetes mellitus without complication ([place]) 29/52/8413   Essential hypertension, benign 08/19/2013   Ventral hernia 08/19/2013    REFERRING DIAG: M25.551 (ICD-10-CM) - Pain in right hip M70.61 (ICD-10-CM) - Trochanteric bursitis, right hip  THERAPY DIAG:  No diagnosis found.  Rationale for Evaluation and Treatment Rehabilitation  PERTINENT HISTORY:   Hip injection on 08/27/2021 PMH: asthma, arthritis, DM, GERD HTN, glaucoma both eyes, abdominal hysterectomy,   PRECAUTIONS: None   SUBJECTIVE: Pt stating she was sore following the needling last visit but was able to sleep without waking up on the 2nd night.   PAIN:  Are you having pain? Yes: NPRS scale: 3-4/10 Pain location: Rt hip Pain description: sharp Aggravating factors: unknown Relieving factors: exercises   OBJECTIVE: (objective measures completed at initial evaluation unless otherwise dated)   PATIENT SURVEYS:  11/25/2021: FOTO 59% (predicted 64%)    MUSCLE LENGTH:   11/25/21:Hamstrings: Right 80 deg; Left 86 deg Thomas test: Right 10 deg; Left 8 deg    LOWER EXTREMITY ROM:   Active / Passive ROM Right 11/25/21 A/P Left 11/25/21 A/P  Hip flexion 95/110 105/120  Hip extension 8/15 10/15  Hip abduction 32/38 35/40  Hip adduction      Hip internal rotation 35/40 40/48  Hip external rotation 40/44 42/46  Knee flexion 128/ 132 126/130  Knee extension 0 0   (Blank rows = not tested)   LOWER EXTREMITY MMT:   MMT Right eval Left eval  Hip flexion 4/5 4/5  Hip extension 5/5 5/5  Hip abduction 4/5 5/5  Hip adduction 4/5 5/5  Hip internal rotation      Hip external rotation      Knee flexion 5/5 5/5  Knee extension 5/5 5/5   (Blank rows = not tested)   LOWER EXTREMITY SPECIAL TESTS:  Hip special tests: Saralyn Pilar (FABER) test: negative Lateral greater trochanter tenderness/pain with compression in sidelying   FUNCTIONAL TESTS:  11/25/21: 5 times sit to stand: 10 seconds with no UE support   GAIT: Distance walked: 30 feet  Assistive device utilized: None Level of assistance: Complete Independence Comments: pt with mild antalgic gait       TODAY'S TREATMENT: 12/15/21 Therex: NuStep L5 x 8 min Seated Piriformis stretch: bilateral x 2 holding 30 sec Hip flexor lunge stretch x 2 each LE holding 30 sec Mini squats 2 x 10 in parallel bars c UE support Hip hike  x 10 each LE Hip abduction 2 x 10 each LE, UE support Hip extension 2 x 10 each LE, UE support Leg Press 50 # 3 x 10 bilateral LEs  Manual: STM with compression to Rt glutes and lateral hamstrings; skilled palpation and monitoring of soft tissue during DN  Trigger Point Dry-Needling  Treatment instructions: Expect mild to moderate muscle soreness. S/S of pneumothorax if dry needled over a lung field, and to seek immediate medical attention should they occur. Patient verbalized understanding of these instructions and education.  Patient Consent Given: Yes Education handout provided: previous visit Muscles treated: Rt glute med, min, lateral hamstring Treatment response/outcome: twitch responses with decreased pain reported     12/08/21 Therex: NuStep L5 x 8 min Piriformis stretch: bilateral x 2 holding 30 sec Lunge stretch with foot on 6 inch step holding 10 sec x 5 each LE Hip hike x 10 each LE Hip abduction 2 x 10 each LE, UE support Hip extension  2 x 10 each LE, UE support Seated hamstring stretch x 2 bilateral LE's holding 30 sec Leg Press 50 # 3 x 10 bilateral LEs  Manual: STM with compression to Rt glutes and lateral hamstrings; skilled palpation and monitoring of soft tissue during DN  Trigger Point Dry-Needling  Treatment instructions: Expect mild to moderate muscle soreness. S/S of pneumothorax if dry needled over a lung field, and to seek immediate medical attention should they occur. Patient verbalized understanding of these instructions and education.  Patient Consent Given: Yes Education handout provided: Yes Muscles treated: Rt glute med, min, lateral hamstring Electrical stimulation performed: No Parameters: N/A Treatment response/outcome: twitch responses with decreased pain reported   12/03/21 Therex:  NuStep L5 x 5 min Reviewed HEP and encouraged daily activity  Manual: STM with compression to Rt glutes and lateral hamstrings; skilled palpation and  monitoring of soft tissue during DN  Trigger Point Dry-Needling  Treatment instructions: Expect mild to moderate muscle soreness. S/S of pneumothorax if dry needled over a lung field, and to seek immediate medical attention should they occur. Patient verbalized understanding of these instructions and education.  Patient Consent Given: Yes Education handout provided: Yes Muscles treated: bilateral lateral hamstrings, TFL bilateral  Electrical stimulation performed: No Parameters: N/A Treatment response/outcome: twitch responses with decreased pain reported        PATIENT EDUCATION:  Education details: PT POC, HEP Person educated: Patient Education method: Consulting civil engineer, Demonstration, Corporate treasurer cues, Verbal cues, and Handouts Education comprehension: verbalized understanding, returned demonstration, and needs further education     HOME EXERCISE PROGRAM: Access Code: [email or web address] URL: [email or web address] Date: 11/25/2021 Prepared by: Kearney Hard   Exercises - Supine Bridge  - 2 x daily - 7 x weekly - 10 reps - 3 seconds hold - Supine Piriformis Stretch with Foot on Ground  - 2 x daily - 7 x weekly - 3 reps - 20 seconds hold - Supine Figure 4 Piriformis Stretch  - 2 x daily - 7 x weekly - 3 reps - 20 seconds hold - Supine Lower Trunk Rotation  - 2 x daily - 7 x weekly - 3 reps - 20 seconds hold - Sidelying Hip Abduction  - 2 x daily - 7 x weekly - 2 sets - 10 reps - Standing Hip Flexor Stretch  - 2 x daily - 7 x weekly - 3 reps - 20 seconds hold   ASSESSMENT:   CLINICAL IMPRESSION:      Continue with skilled PT to progress toward maximizal functional mobility.    OBJECTIVE IMPAIRMENTS decreased activity tolerance, decreased balance, decreased mobility, difficulty walking, decreased ROM, decreased strength, impaired flexibility, and pain.    ACTIVITY LIMITATIONS lifting, bending, sitting, sleeping, and stairs   PARTICIPATION LIMITATIONS: community activity    PERSONAL FACTORS asthma, arthritis, DM, GERD HTN, glaucoma both eyes, abdominal hysterectomy,  are also affecting patient's functional outcome.    REHAB POTENTIAL: Good   CLINICAL DECISION MAKING: Stable/uncomplicated   EVALUATION COMPLEXITY: Low     GOALS: Goals reviewed with patient? Yes   SHORT TERM GOALS: Target date: 12/16/2021  Patient will demonstrate independent use of initial home exercise program to maintain progress from in clinic treatments. Goal status: Met 12/08/2021   Long term PT goals: Target date 01/09/22 Patient will demonstrate/report pain at worst less than or equal to 2/10 to facilitate minimal limitation in daily activity secondary to pain symptoms. Goal status: New   Patient will demonstrate independent use of home exercise program to facilitate ability to  maintain/progress functional gains from skilled physical therapy services. Goal status: New   Patient will demonstrate FOTO outcome > or = 64 % to indicate reduced disability due to condition. Goal status: New   Pt will be able to walk for >/= 15 minutes with normalized gait with no pain reported.  Goal status: New       5.  Pt will improve her right hip flexion and abduction to 5/5 in order to improve functional mobility.   Goal status: New     PLAN: PT FREQUENCY: 1-2x/week   PT DURATION: 6 weeks   PLANNED INTERVENTIONS:   Therapeutic exercises, Therapeutic activity, Neuro Muscular re-education, Balance training, Gait training, Patient/Family education, Joint mobilization, Stair training, DME instructions, Dry Needling, Electrical stimulation, Cryotherapy, Moist heat, Taping, Ultrasound, Ionotophoresis 10m/ml Dexamethasone, and Manual therapy.  All included unless contraindicated   PLAN FOR NEXT SESSION: Assess response to DN, Nustep, hip stretching, hip strengthening, IT band stretching      JKearney Hard PT, MPT 12/15/21 8:54 AM   12/15/21 8:54 AM

## 2021-12-22 ENCOUNTER — Encounter: Payer: Self-pay | Admitting: Physical Therapy

## 2021-12-22 ENCOUNTER — Ambulatory Visit (INDEPENDENT_AMBULATORY_CARE_PROVIDER_SITE_OTHER): Payer: BC Managed Care – PPO | Admitting: Physical Therapy

## 2021-12-22 DIAGNOSIS — M25551 Pain in right hip: Secondary | ICD-10-CM | POA: Diagnosis not present

## 2021-12-22 DIAGNOSIS — M6281 Muscle weakness (generalized): Secondary | ICD-10-CM

## 2021-12-22 DIAGNOSIS — R262 Difficulty in walking, not elsewhere classified: Secondary | ICD-10-CM

## 2021-12-22 NOTE — Therapy (Signed)
OUTPATIENT PHYSICAL THERAPY TREATMENT NOTE   Patient Name: [name] MRN: [id number] DOB:1955/10/16, 66 y.o., female Today's Date: 12/22/2021  PCP: Antony Contras MD REFERRING PROVIDER: Jean Rosenthal MD  END OF SESSION:   PT End of Session - 12/22/21 0941     Visit Number 4    Number of Visits 13    Date for PT Re-Evaluation 01/09/22    Authorization Type BBS    PT Start Time 0932    PT Stop Time 5643    PT Time Calculation (min) 43 min    Activity Tolerance Patient tolerated treatment well    Behavior During Therapy [facility] for tasks assessed/performed               Past Medical History:  Diagnosis Date   Anemia 2006   required transfusion post TAH/BSO 05/2005   Arthritis    Asthma    Chronic cough    Colon polyps    hyperplastic   Diabetes mellitus without complication (HCC)    Diverticulosis    GERD (gastroesophageal reflux disease)    on prilosec   GLA deficiency ([place])    Glaucoma of both eyes    Hypertension    Joint pain    Osteoarthritis    Pancreatitis    Shortness of breath dyspnea    Vitamin D deficiency    Past Surgical History:  Procedure Laterality Date   ABDOMINAL HYSTERECTOMY     BREAST EXCISIONAL BIOPSY Right 1990   COLONOSCOPY N/A 08/22/2013   Procedure: COLONOSCOPY;  Surgeon: Irene Shipper, MD;  Location: [place];  Service: Endoscopy;  Laterality: N/A;   ESOPHAGOGASTRODUODENOSCOPY N/A 08/22/2013   Procedure: ESOPHAGOGASTRODUODENOSCOPY (EGD);  Surgeon: Irene Shipper, MD;  Location: [facility] ENDOSCOPY;  Service: Endoscopy;  Laterality: N/A;   ESOPHAGOGASTRODUODENOSCOPY (EGD) WITH PROPOFOL N/A 08/19/2015   Procedure: ESOPHAGOGASTRODUODENOSCOPY (EGD) WITH PROPOFOL;  Surgeon: Irene Shipper, MD;  Location: [facility] ENDOSCOPY;  Service: Endoscopy;  Laterality: N/A;   HERNIA REPAIR     HYSTEROSCOPY WITH D & C  01/2005   for uterine fibroids.    INSERTION OF MESH N/A 08/24/2013   Procedure: INSERTION OF MESH;  Surgeon: Edward Jolly, MD;   Location: [place];  Service: General;  Laterality: N/A;   JOINT REPLACEMENT Right    LIPOMA EXCISION Left 06/21/2015   Procedure: EXCISION OF LEFT SCALP LIPOMA;  Surgeon: Johnathan Hausen, MD;  Location: [place];  Service: General;  Laterality: Left;   PANNICULECTOMY N/A 08/24/2013   Procedure: PANNICULECTOMY;  Surgeon: Edward Jolly, MD;  Location: [place];  Service: General;  Laterality: N/A;   TOTAL ABDOMINAL HYSTERECTOMY W/ BILATERAL SALPINGOOPHORECTOMY  05/2005   TOTAL HIP ARTHROPLASTY Right 12/12/2013   Procedure: RIGHT TOTAL HIP ARTHROPLASTY ANTERIOR APPROACH;  Surgeon: Mcarthur Rossetti, MD;  Location: [place];  Service: Orthopedics;  Laterality: Right;   VENTRAL HERNIA REPAIR N/A 08/24/2013   Procedure: HERNIA REPAIR VENTRAL ADULT;  Surgeon: Edward Jolly, MD;  Location: [place];  Service: General;  Laterality: N/A;   Patient Active Problem List   Diagnosis Date Noted   Asthma exacerbation 09/19/2020   Anemia 10/13/2019   Glaucoma 10/13/2019   Hypertensive disorder 10/13/2019   Uterine leiomyoma 10/13/2019   Vitamin D insufficiency 04/26/2019   TIA (transient ischemic attack) 03/30/2019   Hyperlipidemia 03/30/2019   Thyroid nodule 03/30/2019   Stroke-like episode s/p IV tPA 03/28/2019   GERD (gastroesophageal reflux disease) 06/19/2018   Constipation 06/19/2018   Other fatigue  08/02/2017   Shortness of breath on exertion 08/02/2017   Mass of axilla 07/07/2017   Abdominal pain, epigastric    Abnormal CT of the abdomen    Pancreatitis, acute    Acute pancreatitis 08/13/2015   UTI (lower urinary tract infection) 08/13/2015   Severe obesity (BMI >= 40) ([place]) 01/23/2015   Sinusitis, chronic 01/08/2015   Cough variant asthma 01/03/2015   Arthritis of right hip 12/12/2013   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   Benign neoplasm of colon 08/22/2013   Special screening for malignant neoplasms, colon 08/22/2013   Abdominal pain 08/19/2013    Diabetes mellitus without complication ([place]) 32/05/2481   Essential hypertension, benign 08/19/2013   Ventral hernia 08/19/2013    REFERRING DIAG: M25.551 (ICD-10-CM) - Pain in right hip M70.61 (ICD-10-CM) - Trochanteric bursitis, right hip  THERAPY DIAG:  Pain in right hip  Muscle weakness (generalized)  Difficulty in walking, not elsewhere classified  Rationale for Evaluation and Treatment Rehabilitation  PERTINENT HISTORY:  Hip injection on 08/27/2021 PMH: asthma, arthritis, DM, GERD HTN, glaucoma both eyes, abdominal hysterectomy,   PRECAUTIONS: None   SUBJECTIVE: Pt is still concerned about her limping and pain when she first gets up after sitting. Pt still reporting going up and down on her 2 steps is difficult. Pt reporting no more pain at night.   PAIN:  Are you having pain? Yes: NPRS scale: 4/10 Pain location: Rt hip Pain description: sharp Aggravating factors: unknown Relieving factors: exercises   OBJECTIVE: (objective measures completed at initial evaluation unless otherwise dated)   PATIENT SURVEYS:  11/25/2021: FOTO 59% (predicted 64%) 12/22/2021: FOTO 56%     MUSCLE LENGTH:   11/25/21:Hamstrings: Right 80 deg; Left 86 deg Thomas test: Right 10 deg; Left 8 deg    LOWER EXTREMITY ROM:   Active / Passive ROM Right 11/25/21 A/P Left 11/25/21 A/P  Hip flexion 95/110 105/120  Hip extension 8/15 10/15  Hip abduction 32/38 35/40  Hip adduction      Hip internal rotation 35/40 40/48  Hip external rotation 40/44 42/46  Knee flexion 128/ 132 126/130  Knee extension 0 0   (Blank rows = not tested)   LOWER EXTREMITY MMT:   MMT Right eval Left eval  Hip flexion 4/5 4/5  Hip extension 5/5 5/5  Hip abduction 4/5 5/5  Hip adduction 4/5 5/5  Hip internal rotation      Hip external rotation      Knee flexion 5/5 5/5  Knee extension 5/5 5/5   (Blank rows = not tested)   LOWER EXTREMITY SPECIAL TESTS:  Hip special tests: Saralyn Pilar (FABER) test:  negative Lateral greater trochanter tenderness/pain with compression in sidelying   FUNCTIONAL TESTS:  11/25/21: 5 times sit to stand: 10 seconds with no UE support   GAIT: Distance walked: 30 feet  Assistive device utilized: None Level of assistance: Complete Independence Comments: pt with mild antalgic gait       TODAY'S TREATMENT: 12/22/21 Therex: NuStep L5 x 8 min Seated hamstring stretch bilateral x 2 holding 30 sec Seated Piriformis stretch: bilateral x 2 holding 30 sec Hip flexor lunge stretch x 2 each LE holding 30 sec Mini squats 2 x 10 in parallel bars c UE support Standing c UE support: Hip flexion then ER/ABd knees at 90 degrees x 10 Rt LE only Hip extension x 15 each LE, UE support Sidelying: reverse clam position c feet elevated and then perform clam shell 2 x 10  Manual: skilled  palpation and monitoring of soft tissue during DN STM with biofreeze to Rt IT band and Rt TFL  Trigger Point Dry-Needling  Treatment instructions: Expect mild to moderate muscle soreness. S/S of pneumothorax if dry needled over a lung field, and to seek immediate medical attention should they occur. Patient verbalized understanding of these instructions and education.  Patient Consent Given: Yes Education handout provided: previous visit Muscles treated: Rt TFL Treatment response/outcome:multiple twitch responses noted Modalities:  Mosit heat x 5 minutes to Rt lateral hip       12/15/21 Therex: NuStep L5 x 8 min Seated Piriformis stretch: bilateral x 2 holding 30 sec Hip flexor lunge stretch x 2 each LE holding 30 sec Mini squats 2 x 10 in parallel bars c UE support Hip hike x 10 each LE Hip abduction 2 x 10 each LE, UE support Hip extension 2 x 10 each LE, UE support Leg Press 50 # 3 x 10 bilateral LEs  Manual: STM with compression to Rt glutes and lateral hamstrings; skilled palpation and monitoring of soft tissue during DN  Trigger Point Dry-Needling  Treatment  instructions: Expect mild to moderate muscle soreness. S/S of pneumothorax if dry needled over a lung field, and to seek immediate medical attention should they occur. Patient verbalized understanding of these instructions and education.  Patient Consent Given: Yes Education handout provided: previous visit Muscles treated: Rt glute med, min, lateral hamstring Treatment response/outcome: twitch responses with decreased pain reported     12/08/21 Therex: NuStep L5 x 8 min Piriformis stretch: bilateral x 2 holding 30 sec Lunge stretch with foot on 6 inch step holding 10 sec x 5 each LE Hip hike x 10 each LE Hip abduction 2 x 10 each LE, UE support Hip extension 2 x 10 each LE, UE support Seated hamstring stretch x 2 bilateral LE's holding 30 sec Leg Press 50 # 3 x 10 bilateral LEs  Manual: STM with compression to Rt glutes and lateral hamstrings; skilled palpation and monitoring of soft tissue during DN  Trigger Point Dry-Needling  Treatment instructions: Expect mild to moderate muscle soreness. S/S of pneumothorax if dry needled over a lung field, and to seek immediate medical attention should they occur. Patient verbalized understanding of these instructions and education.  Patient Consent Given: Yes Education handout provided: Yes Muscles treated: Rt glute med, min, lateral hamstring Electrical stimulation performed: No Parameters: N/A Treatment response/outcome: twitch responses with decreased pain reported       PATIENT EDUCATION:  Education details: PT POC, HEP Person educated: Patient Education method: Consulting civil engineer, Demonstration, Tactile cues, Verbal cues, and Handouts Education comprehension: verbalized understanding, returned demonstration, and needs further education     HOME EXERCISE PROGRAM: Access Code: [email or web address] URL: [email or web address] Date: 11/25/2021 Prepared by: Kearney Hard   Exercises - Supine Bridge  - 2 x daily - 7 x weekly - 10  reps - 3 seconds hold - Supine Piriformis Stretch with Foot on Ground  - 2 x daily - 7 x weekly - 3 reps - 20 seconds hold - Supine Figure 4 Piriformis Stretch  - 2 x daily - 7 x weekly - 3 reps - 20 seconds hold - Supine Lower Trunk Rotation  - 2 x daily - 7 x weekly - 3 reps - 20 seconds hold - Sidelying Hip Abduction  - 2 x daily - 7 x weekly - 2 sets - 10 reps - Standing Hip Flexor Stretch  - 2 x daily - 7 x weekly - 3  reps - 20 seconds hold   ASSESSMENT:   CLINICAL IMPRESSION: Pt tolerating exercises well. Pt still reporting initial pain when standing and first getting up after prolonged sitting. Pt is however able to sleep through the night now. Pt reporting compliance in her HEP. Pt agreeing to try DN again this visit to help with pain and relaxation of her muscles Rt TFL needled today. Continue with skilled PT to progress toward maximizal functional mobility.    OBJECTIVE IMPAIRMENTS decreased activity tolerance, decreased balance, decreased mobility, difficulty walking, decreased ROM, decreased strength, impaired flexibility, and pain.    ACTIVITY LIMITATIONS lifting, bending, sitting, sleeping, and stairs   PARTICIPATION LIMITATIONS: community activity   PERSONAL FACTORS asthma, arthritis, DM, GERD HTN, glaucoma both eyes, abdominal hysterectomy,  are also affecting patient's functional outcome.    REHAB POTENTIAL: Good   CLINICAL DECISION MAKING: Stable/uncomplicated   EVALUATION COMPLEXITY: Low     GOALS: Goals reviewed with patient? Yes   SHORT TERM GOALS: Target date: 12/16/2021  Patient will demonstrate independent use of initial home exercise program to maintain progress from in clinic treatments. Goal status: Met 12/08/2021   Long term PT goals: Target date 01/09/22 Patient will demonstrate/report pain at worst less than or equal to 2/10 to facilitate minimal limitation in daily activity secondary to pain symptoms. Goal status: New   Patient will demonstrate  independent use of home exercise program to facilitate ability to maintain/progress functional gains from skilled physical therapy services. Goal status: New   Patient will demonstrate FOTO outcome > or = 64 % to indicate reduced disability due to condition. Goal status: New   Pt will be able to walk for >/= 15 minutes with normalized gait with no pain reported.  Goal status: New       5.  Pt will improve her right hip flexion and abduction to 5/5 in order to improve functional mobility.   Goal status: New     PLAN: PT FREQUENCY: 1-2x/week   PT DURATION: 6 weeks   PLANNED INTERVENTIONS:   Therapeutic exercises, Therapeutic activity, Neuro Muscular re-education, Balance training, Gait training, Patient/Family education, Joint mobilization, Stair training, DME instructions, Dry Needling, Electrical stimulation, Cryotherapy, Moist heat, Taping, Ultrasound, Ionotophoresis 79m/ml Dexamethasone, and Manual therapy.  All included unless contraindicated   PLAN FOR NEXT SESSION: Continue with Nustep, hip stretching, hip strengthening, IT band stretching, [facility]     JKearney Hard PT, MPT 12/22/21 10:14 AM   12/22/21 10:14 AM

## 2022-01-02 ENCOUNTER — Encounter: Payer: Self-pay | Admitting: Emergency Medicine

## 2022-01-02 ENCOUNTER — Ambulatory Visit (INDEPENDENT_AMBULATORY_CARE_PROVIDER_SITE_OTHER): Payer: BC Managed Care – PPO | Admitting: Emergency Medicine

## 2022-01-02 ENCOUNTER — Encounter: Payer: BC Managed Care – PPO | Admitting: Physical Therapy

## 2022-01-02 DIAGNOSIS — J4521 Mild intermittent asthma with (acute) exacerbation: Secondary | ICD-10-CM | POA: Diagnosis not present

## 2022-01-02 MED ORDER — PREDNISONE 10 MG PO TABS: ORAL_TABLET | ORAL | 0 refills | Status: DC

## 2022-01-02 MED ORDER — AZITHROMYCIN 250 MG PO TABS: ORAL_TABLET | ORAL | 0 refills | Status: DC

## 2022-01-02 MED ORDER — OMEPRAZOLE 20 MG PO CPDR: 20.0000 mg | DELAYED_RELEASE_CAPSULE | Freq: Two times a day (BID) | ORAL | 2 refills | Status: AC

## 2022-01-02 NOTE — Assessment & Plan Note (Signed)
Flaring symptoms, likely both upper airway and asthma.  She does have some wheeze, also stridor.  Question why she is having recurrent flares, possibly due to some breakthrough GERD.  We will try to address this.  Continue to treat her rhinitis (she has breakthrough PND as well).  Please take prednisone as directed until completely gone Please take azithromycin as directed until completely gone Continue your Symbicort 2 puffs twice a day.  Rinse and gargle after using. Continue your Zyrtec once daily Continue fluticasone nasal spray as you have been taking Continue Singulair 10 mg each evening Continue Xolair on your current schedule. Increase your omeprazole to 20 mg twice a day until next visit.  Try taking this 1 hour around food. Please call our office next week if you are not improving. Follow-up with APP or Dr. Silas Flood in 3 to 4 weeks to assess progress

## 2022-01-02 NOTE — Progress Notes (Signed)
Subjective:    Patient ID: [name], female    DOB: 1956/03/01, 66 y.o.   MRN: [id number]  HPI 66 year old former smoker who has a history of moderate persistent asthma.  She has been managed on Symbicort.  Her symptoms are characterized by wheezing, coughing.  Followed previously by Dr. Melvyn Novas and now by Dr. Silas Flood.  She has an elevated IgE and eosinophilic phenotype, is on Xolair beginning at the end of 2022.  Currently managed on Zyrtec, chlor tabs as needed, fluticasone nasal spray, Singulair.  She remains on Xolair - last dose was this week. She was treated w pred about 3-4 weeks ago.  She reports that when her pred ended she began to have increased cough for about 2 weeks, wheeze for over a week. Sometimes productive, yellow mucous. She is having increased exertional SOB. She is using albuterol about 1-2x a day, most nights. Congestion and rhinitis are still active. She is having breakthrough GERD on omeprazole qd.   Review of Systems As per HPi  Past Medical History:  Diagnosis Date   Anemia 2006   required transfusion post TAH/BSO 05/2005   Arthritis    Asthma    Chronic cough    Colon polyps    hyperplastic   Diabetes mellitus without complication (HCC)    Diverticulosis    GERD (gastroesophageal reflux disease)    on prilosec   GLA deficiency (HCC)    Glaucoma of both eyes    Hypertension    Joint pain    Osteoarthritis    Pancreatitis    Shortness of breath dyspnea    Vitamin D deficiency      Family History  Problem Relation Age of Onset   Emphysema Mother        smoked   Diabetes Mother    Hypertension Mother    Colon cancer Father        7-s   Hypertension Father    Heart disease Father    High Cholesterol Father    Breast cancer Sister      Social History   Socioeconomic History   Marital status: Married    Spouse name: Saleemah Mollenhauer   Number of children: 5   Years of education: Not on file   Highest education level: Not on file   Occupational History   Occupation: Education officer, museum  Tobacco Use   Smoking status: Former    Packs/day: 0.25    Years: 15.00    Total pack years: 3.75    Types: Cigarettes    Quit date: 06/22/1978    Years since quitting: 43.5   Smokeless tobacco: Never  Vaping Use   Vaping Use: Never used  Substance and Sexual Activity   Alcohol use: No    Alcohol/week: 0.0 standard drinks of alcohol   Drug use: No   Sexual activity: Not on file  Other Topics Concern   Not on file  Social History Narrative   Not on file   Social Determinants of Health   Financial Resource Strain: Not on file  Food Insecurity: Not on file  Transportation Needs: Not on file  Physical Activity: Not on file  Stress: Not on file  Social Connections: Not on file  Intimate Partner Violence: Not on file     Allergies  Allergen Reactions   Naproxen     "hives, I had to call 911"    Diclofenac     Hives    Penicillins Hives    Has  patient had a PCN reaction causing immediate rash, facial/tongue/throat swelling, SOB or lightheadedness with hypotension: Yes Has patient had a PCN reaction causing severe rash involving mucus membranes or skin necrosis: Yes Has patient had a PCN reaction that required hospitalization No Has patient had a PCN reaction occurring within the last 10 years: No. If all of the above answers are "NO", then may proceed with Cephalosporin use.      Outpatient Medications Prior to Visit  Medication Sig Dispense Refill   albuterol (PROVENTIL) (2.5 MG/3ML) 0.083% nebulizer solution Take 3 mLs (2.5 mg total) by nebulization every 6 (six) hours as needed for wheezing or shortness of breath. 75 mL 0   aspirin EC 81 MG EC tablet Take 1 tablet (81 mg total) by mouth daily.     atorvastatin (LIPITOR) 20 MG tablet Take 1 tablet (20 mg total) by mouth daily at 6 PM. 30 tablet 2   budesonide-formoterol (SYMBICORT) 160-4.5 MCG/ACT inhaler Inhale 2 puffs into the lungs in the morning and at bedtime. 1  each 12   cetirizine (ZYRTEC ALLERGY) 10 MG tablet Take 1 tablet (10 mg total) by mouth daily. 30 tablet 2   chlorpheniramine (CHLOR-TRIMETON) 4 MG tablet Take 1 tablet (4 mg total) by mouth every 6 (six) hours as needed for allergies. 30 tablet 0   EPINEPHrine 0.3 mg/0.3 mL IJ SOAJ injection Inject 0.3 mg into the muscle as needed for anaphylaxis. 1 each 2   fluticasone (FLONASE) 50 MCG/ACT nasal spray Place 1 spray into both nostrils daily. 15 mL 2   glucose blood test strip Use as instructed 100 each 1   MELATONIN PO Take 1 tablet by mouth at bedtime.     metFORMIN (GLUCOPHAGE) 1000 MG tablet Take 1,000 mg by mouth 2 (two) times daily with a meal.     montelukast (SINGULAIR) 10 MG tablet TAKE 1 TABLET(10 MG) BY MOUTH AT BEDTIME 30 tablet 11   omalizumab (XOLAIR) 150 MG/ML prefilled syringe INJECT 2 SYRINGES UNDER THE SKIN EVERY 2 WEEKS. TOTAL DOSE = 375 MG 12 mL 1   omalizumab (XOLAIR) 75 MG/0.5ML prefilled syringe INJECT 1 SYRINGE UNDER THE SKIN EVERY 2 WEEKS. TOTAL DOSE = 375 MG 6 mL 1   omeprazole (PRILOSEC) 20 MG capsule Take 20 mg by mouth daily.     pioglitazone (ACTOS) 15 MG tablet Take 15 mg by mouth daily.     traMADol (ULTRAM) 50 MG tablet Take 1-2 tablets (50-100 mg total) by mouth every 12 (twelve) hours as needed. 60 tablet 0   Travoprost, BAK Free, (TRAVATAN) 0.004 % SOLN ophthalmic solution      HYDROcodone-acetaminophen (NORCO) 5-325 MG tablet Take 1 tablet by mouth every 8 (eight) hours as needed for moderate pain. 10 tablet 0   No facility-administered medications prior to visit.        Objective:   Physical Exam Vitals:   01/02/22 1423  BP: 126/74  Pulse: 76  Temp: 98.1 F (36.7 C)  TempSrc: Oral  SpO2: 94%  Weight: 175 lb 9.6 oz (79.7 kg)  Height: '5\' 2"'$  (1.575 m)   Gen: Pleasant, overweight woman, in no distress,  normal affect  ENT: No lesions,  mouth clear,  oropharynx clear, no postnasal drip  Neck: No JVD, intermittent mild stridor  Lungs: No use of  accessory muscles, scattered inspiratory squeaks, expiratory wheezes  Cardiovascular: RRR, heart sounds normal, no murmur or gallops, no peripheral edema  Musculoskeletal: No deformities, no cyanosis or clubbing  Neuro: alert, awake, non  focal  Skin: Warm, no lesions or rash      Assessment & Plan:   Asthma exacerbation Flaring symptoms, likely both upper airway and asthma.  She does have some wheeze, also stridor.  Question why she is having recurrent flares, possibly due to some breakthrough GERD.  We will try to address this.  Continue to treat her rhinitis (she has breakthrough PND as well).  Please take prednisone as directed until completely gone Please take azithromycin as directed until completely gone Continue your Symbicort 2 puffs twice a day.  Rinse and gargle after using. Continue your Zyrtec once daily Continue fluticasone nasal spray as you have been taking Continue Singulair 10 mg each evening Continue Xolair on your current schedule. Increase your omeprazole to 20 mg twice a day until next visit.  Try taking this 1 hour around food. Please call our office next week if you are not improving. Follow-up with APP or Dr. Silas Flood in 3 to 4 weeks to assess progress   Baltazar Apo, MD, PhD 01/02/2022, 2:51 PM [facility] [phone] or if no answer before 7:00PM call [phone] For any issues after 7:00PM please call eLink [phone]

## 2022-01-02 NOTE — Patient Instructions (Addendum)
Please take prednisone as directed until completely gone Please take azithromycin as directed until completely gone Continue your Symbicort 2 puffs twice a day.  Rinse and gargle after using. Continue your Zyrtec once daily Continue fluticasone nasal spray as you have been taking Continue Singulair 10 mg each evening Continue Xolair on your current schedule. Increase your omeprazole to 20 mg twice a day until next visit.  Try taking this 1 hour around food. Please call our office next week if you are not improving. Follow-up with APP or Dr. Silas [name] in 3 to 4 weeks to assess progress

## 2022-01-10 LAB — GLUCOSE, POCT (MANUAL RESULT ENTRY): POC Glucose: 100 mg/dl — AB (ref 70–99)

## 2022-01-14 ENCOUNTER — Ambulatory Visit: Payer: BC Managed Care – PPO | Admitting: Pulmonary Disease

## 2022-01-21 ENCOUNTER — Encounter: Payer: Self-pay | Admitting: Pulmonary Disease

## 2022-01-21 ENCOUNTER — Ambulatory Visit (INDEPENDENT_AMBULATORY_CARE_PROVIDER_SITE_OTHER): Payer: BC Managed Care – PPO | Admitting: Pulmonary Disease

## 2022-01-21 VITALS — BP 120/66 | HR 66 | Temp 98.6°F | Ht 62.0 in | Wt 175.2 lb

## 2022-01-21 DIAGNOSIS — R062 Wheezing: Secondary | ICD-10-CM | POA: Diagnosis not present

## 2022-01-21 NOTE — Patient Instructions (Addendum)
No changes in medication today  We will start paperwork for a new injectable medication, Tezspire.  I ordered a CT scan to evaluate for flattening of the air tubes and because wheeze in the evening and night.  If this is normal, we will move forward with stopping Xolair and starting this new injectable medication.  Return to clinic in 2 months or sooner if needed with Dr. Silas Flood

## 2022-01-21 NOTE — Progress Notes (Signed)
$'@Patient'L$  ID: [name], female    DOB: 01/29/1956, 66 y.o.   MRN: [id number]  Chief Complaint  Patient presents with   Follow-up    Pt is here for follow up for SOB/asthma. Pt states that she is still wheezing really bad at night. Pt is taking Xolair injections. Pt is currently on Symbicort daily and Albuterol as needed. Pt states Symbicort helps some with her SOB. Pt states she is confused about prednisone.     Referring provider: Antony Contras, MD  HPI:   66 y.o. woman who previously followed in pulmonary clinic with Dr. Melvyn Novas with asthma here for follow up of DOE felt to be poorly controlled asthma exacerbated by COVID infection.  Most recent pulmonary note 11/2021 with Dr. Lamonte Sakai, acute visit reviewed.  Over the last 2 months, persistent wheeze.  Particularly in the evenings.  Sometimes when lies down.  She continues on Xolair.  Unfortunately poor control over the last 2 months.  2 courses of prednisone, about once a month over the last 2 months.  Helps for period of time but wheeze persists.  Cough also back a bit.  Xolair had been adequate controlling this for a period of time.  HPI at initial visit: Patient has been diagnosed with asthma for years.  Usually relatively well controlled.  Uses Symbicort mid dose.  About once or twice a years with changes states since, spring and fall she has worsening breathing.  This occurred earlier in the month.  All of a sudden she had significant chest tightness, wheeze, shortness of breath.  Associated with hacking cough.  Dry.  Went to urgent care and received a dose of IV steroids.  Her symptoms have improved a good deal but still has lingering cough and mild chest tightness, shortness of breath.  No fever or chills.  No sick contacts.  Denies any viral symptoms such as sore throat, runny nose, fever at time of onset of symptoms.  When asked, she does think maybe she had a little more nasal congestion on the days preceding this acute  change.  Chest x-ray 03/04/2020 reviewed and interpreted as clear lungs with mild hyperinflation with increased AP diameter on lateral film.  Prior labs reviewed which are notable for elevated IgE greater than 1200 and eosinophils as high as 800 in the past.  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0 03/15/2021  1539   RBC 4.06 03/15/2021 1539   HGB 12.1 03/15/2021 1539   HGB 13.4 08/02/2017 1035   HCT 38.1 03/15/2021 1539   HCT 40.7 08/02/2017 1035   PLT 269 03/15/2021 1539   MCV 93.8 03/15/2021 1539   MCV 86 08/02/2017 1035   MCH 29.8 03/15/2021 1539   MCHC 31.8 03/15/2021 1539   RDW 14.2 03/15/2021 1539   RDW 14.9 08/02/2017 1035   LYMPHSABS 0.4 (L) 03/15/2021 1539   LYMPHSABS 2.2 08/02/2017 1035   MONOABS 0.3 03/15/2021 1539   EOSABS 0.0 03/15/2021 1539   EOSABS 0.2 08/02/2017 1035   BASOSABS 0.0 03/15/2021 1539   BASOSABS 0.0 08/02/2017 1035    BMET    Component Value Date/Time   NA 138 03/15/2021 1539   NA 141 08/02/2017 1035   K 3.7 03/15/2021 1539   CL 106 03/15/2021 1539   CO2 21 (L)  03/15/2021 1539   GLUCOSE 493 (H) 03/15/2021 1539   BUN 13 03/15/2021 1539   BUN 14 08/02/2017 1035   CREATININE 1.06 (H) 03/15/2021 1539   CALCIUM 9.3 03/15/2021 1539   GFRNONAA 59 (L) 03/15/2021 1539   GFRAA >60 03/28/2019 1326    BNP No results found for: "BNP"  ProBNP No results found for: "PROBNP"  Specialty Problems       Pulmonary Problems   Cough variant asthma    Followed in Pulmonary clinic/ [facility]/ [place]   - allergy profile 01/03/2015 > 0.8, IgE 1256 cat > dog  Dust/ mold - sinus ct > Pos > see sinusitis - 01/17/2015 p extensive coaching HFA effectiveness =   75% > continue symbicort 80 2bid       Sinusitis, chronic    Sinus CT 01/08/2015  Multifocal paranasal sinus disease, most pronounced in the left ethmoid air cell complex and left sphenoid sinus region. Bilateral ostiomeatal unit obstruction. 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 No air-fluid levels.> referred to ENT 01/17/2015 >>>       Shortness of breath on exertion   Asthma exacerbation    Allergies  Allergen Reactions   Naproxen     "hives, I had to call 911"    Diclofenac     Hives    Penicillins Hives    Has patient had a PCN reaction causing immediate rash, facial/tongue/throat swelling, SOB or lightheadedness with hypotension: Yes Has patient had a PCN reaction causing severe rash involving mucus membranes or skin necrosis: Yes Has patient had a PCN reaction that required hospitalization No Has patient had a PCN reaction occurring within the last 10 years: No. If all of the above answers are "NO", then may proceed with Cephalosporin use.     Immunization History  Administered Date(s) Administered   Influenza Split 03/22/2014   Influenza,inj,Quad PF,6+ Mos 04/19/2017, 05/08/2018, 03/18/2019   Influenza,inj,quad, With Preservative 04/23/2019, 03/22/2020   PFIZER(Purple Top)SARS-COV-2 Vaccination 08/19/2019, 09/09/2019   Tdap 05/12/2021    Past Medical  History:  Diagnosis Date   Anemia 2006   required transfusion post TAH/BSO 05/2005   Arthritis    Asthma    Chronic cough    Colon polyps    hyperplastic   Diabetes mellitus without complication (HCC)    Diverticulosis    GERD (gastroesophageal reflux disease)    on prilosec   GLA deficiency (HCC)    Glaucoma of both eyes    Hypertension    Joint pain    Osteoarthritis    Pancreatitis    Shortness of  breath dyspnea    Vitamin D deficiency     Tobacco History: Social History   Tobacco Use  Smoking Status Former   Packs/day: 0.25   Years: 15.00   Total pack years: 3.75   Types: Cigarettes   Quit date: 06/22/1978   Years since quitting: 43.6  Smokeless Tobacco Never   Counseling given: Not Answered   Continue to not smoke  Outpatient Encounter Medications as of 01/21/2022  Medication Sig   albuterol (PROVENTIL) (2.5 MG/3ML) 0.083% nebulizer solution Take 3 mLs (2.5 mg total) by nebulization every 6 (six) hours as needed for wheezing or shortness of breath.   aspirin EC 81 MG EC tablet Take 1 tablet (81 mg total) by mouth daily.   atorvastatin (LIPITOR) 20 MG tablet Take 1 tablet (20 mg total) by mouth daily at 6 PM.   budesonide-formoterol (SYMBICORT) 160-4.5 MCG/ACT inhaler Inhale 2 puffs into the lungs in the morning and at bedtime.   cetirizine (ZYRTEC ALLERGY) 10 MG tablet Take 1 tablet (10 mg total) by mouth daily.   chlorpheniramine (CHLOR-TRIMETON) 4 MG tablet Take 1 tablet (4 mg total) by mouth every 6 (six) hours as needed for allergies.   EPINEPHrine 0.3 mg/0.3 mL IJ SOAJ injection Inject 0.3 mg into the muscle as needed for anaphylaxis.   fluticasone (FLONASE) 50 MCG/ACT nasal spray Place 1 spray into both nostrils daily.   glucose blood test strip Use as instructed   MELATONIN PO Take 1 tablet by mouth at bedtime.   metFORMIN (GLUCOPHAGE) 1000 MG tablet Take 1,000 mg by mouth 2 (two) times daily with a meal.   montelukast (SINGULAIR) 10 MG tablet TAKE 1  TABLET(10 MG) BY MOUTH AT BEDTIME   omalizumab (XOLAIR) 150 MG/ML prefilled syringe INJECT 2 SYRINGES UNDER THE SKIN EVERY 2 WEEKS. TOTAL DOSE = 375 MG   omalizumab (XOLAIR) 75 MG/0.5ML prefilled syringe INJECT 1 SYRINGE UNDER THE SKIN EVERY 2 WEEKS. TOTAL DOSE = 375 MG   omeprazole (PRILOSEC) 20 MG capsule Take 1 capsule (20 mg total) by mouth in the morning and at bedtime.   pioglitazone (ACTOS) 15 MG tablet Take 15 mg by mouth daily.   predniSONE (DELTASONE) 10 MG tablet Take 4 tablets X 3 days, 3 tabs X 3 days, 2 tabs x 3 days, 1 tab x 3 days   Travoprost, BAK Free, (TRAVATAN) 0.004 % SOLN ophthalmic solution    [DISCONTINUED] azithromycin (ZITHROMAX Z-PAK) 250 MG tablet Take 2 tabs today, then 1 tab until gone   [DISCONTINUED] HYDROcodone-acetaminophen (NORCO) 5-325 MG tablet Take 1 tablet by mouth every 8 (eight) hours as needed for moderate pain.   [DISCONTINUED] traMADol (ULTRAM) 50 MG tablet Take 1-2 tablets (50-100 mg total) by mouth every 12 (twelve) hours as needed.   No facility-administered encounter medications on file as of 01/21/2022.     Review of Systems  Review of Systems  n/a Physical Exam  BP 120/66 (BP Location: Left Arm, Patient Position: Sitting, Cuff Size: Normal)   Pulse 66   Temp 98.6 F (37 C) (Oral)   Ht '5\' 2"'$  (1.575 m)   Wt 175 lb 3.2 oz (79.5 kg)   SpO2 98%   BMI 32.04 kg/m   Wt Readings from Last 5 Encounters:  01/21/22 175 lb 3.2 oz (79.5 kg)  01/02/22 175 lb 9.6 oz (79.7 kg)  07/02/21 190 lb 3.2 oz (86.3 kg)  05/12/21 184 lb (83.5 kg)  04/15/21 197 lb 3.2 oz (89.4 kg)    BMI Readings from  Last 5 Encounters:  01/21/22 32.04 kg/m  01/02/22 32.12 kg/m  07/02/21 34.79 kg/m  05/12/21 33.65 kg/m  04/15/21 36.07 kg/m     Physical Exam General: Well-appearing, no acute distress Eyes: EOMI PERRLA Neck: Supple, no JVD Respiratory: Clear to auscultation bilaterally, normal work of breathing Cardiovascular: Regular rate and rhythm, no  murmurs   Assessment & Plan:   Cough: Suspect related to poorly controlled asthma symptoms. Has responded to steroids.  Unfortunate poor control overall despite Xolair use over the last couple months.  See below.  Asthma: Elevated Eos and IgE in past. Recent exacerbation 09/19/2020 in setting of COVID 19 infection and now 2 subsequent exacerbations in the interim. On high dose symbicort and singulair. Steroid course x 1 since last visit.  Eosinophils and IgE both elevated in the past.  More recently, he is on 400 and IgE 900.  Started Xolair in 2022.  Initially cough and dyspnea improving for several months.  However, starting on May 2023 worsening symptoms with 2 exacerbations requiring prednisone interval worsening wheezing cough.  Paperwork today for [employer] and consideration of switching from Xolair.  To further evaluate wheeze CT high-resolution ordered to evaluate presence of tracheobronchial malacia.  Wheeze: Suspect worsening asthma despite Xolair therapy and maximal inhaler therapy.  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  Cardiac cause of wheeze considered but she denies any orthopnea or PND, weight is down, no signs of volume overload on exam.  Consider TTE in the future.  Return in about 2 months (around 03/23/2022).   Lanier Clam, MD 01/21/2022

## 2022-01-26 ENCOUNTER — Encounter: Payer: Self-pay | Admitting: Physical Therapy

## 2022-01-26 ENCOUNTER — Ambulatory Visit (INDEPENDENT_AMBULATORY_CARE_PROVIDER_SITE_OTHER): Payer: BC Managed Care – PPO | Admitting: Physical Therapy

## 2022-01-26 DIAGNOSIS — M25551 Pain in right hip: Secondary | ICD-10-CM

## 2022-01-26 DIAGNOSIS — M6281 Muscle weakness (generalized): Secondary | ICD-10-CM | POA: Diagnosis not present

## 2022-01-26 DIAGNOSIS — R262 Difficulty in walking, not elsewhere classified: Secondary | ICD-10-CM | POA: Diagnosis not present

## 2022-01-26 NOTE — Therapy (Signed)
OUTPATIENT PHYSICAL THERAPY TREATMENT NOTE RE-CERTIFICATION   Patient Name: [name] MRN: [id number] DOB:01-26-1956, 67 y.o., female Today's Date: 01/26/2022  PCP: Antony Contras MD REFERRING PROVIDER: Jean Rosenthal MD  END OF SESSION:   PT End of Session - 01/26/22 0912     Visit Number 5    Number of Visits 13    Date for PT Re-Evaluation 03/27/22    Authorization Type BCBS, REcert sent on 08/22/6710 for extension of time    PT Start Time 0910    PT Stop Time 0955    PT Time Calculation (min) 45 min    Activity Tolerance Patient tolerated treatment well    Behavior During Therapy [facility] for tasks assessed/performed                Past Medical History:  Diagnosis Date   Anemia 2006   required transfusion post TAH/BSO 05/2005   Arthritis    Asthma    Chronic cough    Colon polyps    hyperplastic   Diabetes mellitus without complication (HCC)    Diverticulosis    GERD (gastroesophageal reflux disease)    on prilosec   GLA deficiency ([place])    Glaucoma of both eyes    Hypertension    Joint pain    Osteoarthritis    Pancreatitis    Shortness of breath dyspnea    Vitamin D deficiency    Past Surgical History:  Procedure Laterality Date   ABDOMINAL HYSTERECTOMY     BREAST EXCISIONAL BIOPSY Right 1990   COLONOSCOPY N/A 08/22/2013   Procedure: COLONOSCOPY;  Surgeon: Irene Shipper, MD;  Location: [place];  Service: Endoscopy;  Laterality: N/A;   ESOPHAGOGASTRODUODENOSCOPY N/A 08/22/2013   Procedure: ESOPHAGOGASTRODUODENOSCOPY (EGD);  Surgeon: Irene Shipper, MD;  Location[facility] ENDOSCOPY;  Service: Endoscopy;  Laterality: N/A;   ESOPHAGOGASTRODUODENOSCOPY (EGD) WITH PROPOFOL N/A 08/19/2015   Procedure: ESOPHAGOGASTRODUODENOSCOPY (EGD) WITH PROPOFOL;  Surgeon: Irene Shipper, MD;  Location: [facility] ENDOSCOPY;  Service: Endoscopy;  Laterality: N/A;   HERNIA REPAIR     HYSTEROSCOPY WITH D & C  01/2005   for uterine fibroids.    INSERTION OF MESH N/A 08/24/2013    Procedure: INSERTION OF MESH;  Surgeon: Edward Jolly, MD;  Location: [place];  Service: General;  Laterality: N/A;   JOINT REPLACEMENT Right    LIPOMA EXCISION Left 06/21/2015   Procedure: EXCISION OF LEFT SCALP LIPOMA;  Surgeon: Johnathan Hausen, MD;  Location: [place];  Service: General;  Laterality: Left;   PANNICULECTOMY N/A 08/24/2013   Procedure: PANNICULECTOMY;  Surgeon: Edward Jolly, MD;  Location: [place];  Service: General;  Laterality: N/A;   TOTAL ABDOMINAL HYSTERECTOMY W/ BILATERAL SALPINGOOPHORECTOMY  05/2005   TOTAL HIP ARTHROPLASTY Right 12/12/2013   Procedure: RIGHT TOTAL HIP ARTHROPLASTY ANTERIOR APPROACH;  Surgeon: Mcarthur Rossetti, MD;  Location: [place];  Service: Orthopedics;  Laterality: Right;   VENTRAL HERNIA REPAIR N/A 08/24/2013   Procedure: HERNIA REPAIR VENTRAL ADULT;  Surgeon: Edward Jolly, MD;  Location: [place];  Service: General;  Laterality: N/A;   Patient Active Problem List   Diagnosis Date Noted   Asthma exacerbation 09/19/2020   Anemia 10/13/2019   Glaucoma 10/13/2019   Hypertensive disorder 10/13/2019   Uterine leiomyoma 10/13/2019   Vitamin D insufficiency 04/26/2019   TIA (transient ischemic attack) 03/30/2019   Hyperlipidemia 03/30/2019   Thyroid nodule 03/30/2019   Stroke-like episode s/p IV tPA 03/28/2019   GERD (gastroesophageal reflux  disease) 06/19/2018   Constipation 06/19/2018   Other fatigue 08/02/2017   Shortness of breath on exertion 08/02/2017   Mass of axilla 07/07/2017   Abdominal pain, epigastric    Abnormal CT of the abdomen    Pancreatitis, acute    Acute pancreatitis 08/13/2015   UTI (lower urinary tract infection) 08/13/2015   Severe obesity (BMI >= 40) ([place]) 01/23/2015   Sinusitis, chronic 01/08/2015   Cough variant asthma 01/03/2015   Arthritis of right hip 12/12/2013   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   Benign neoplasm of colon 08/22/2013   Special screening for  malignant neoplasms, colon 08/22/2013   Abdominal pain 08/19/2013   Diabetes mellitus without complication ([place]) 67/67/2094   Essential hypertension, benign 08/19/2013   Ventral hernia 08/19/2013    REFERRING DIAG: M25.551 (ICD-10-CM) - Pain in right hip M70.61 (ICD-10-CM) - Trochanteric bursitis, right hip  THERAPY DIAG:  Pain in right hip  Muscle weakness (generalized)  Difficulty in walking, not elsewhere classified  Rationale for Evaluation and Treatment Rehabilitation  PERTINENT HISTORY:  Hip injection on 08/27/2021 PMH: asthma, arthritis, DM, GERD HTN, glaucoma both eyes, abdominal hysterectomy,   PRECAUTIONS: None   SUBJECTIVE: Pt arriving today reporting less pain in her Rt hip but feels she is limping more with walking and ADL's. Pt has not been able to attend therapy due to transportation but now has a new car.   PAIN:  Are you having pain? Yes: NPRS scale: 3/10 Pain location: Rt hip Pain description: sharp aching Aggravating factors: unknown Relieving factors: exercises   OBJECTIVE: (objective measures completed at initial evaluation unless otherwise dated)   PATIENT SURVEYS:  11/25/2021: FOTO 59% (predicted 64%) 12/22/2021: FOTO 56%  01/26/22: FOTO 51%    MUSCLE LENGTH:   11/25/21:Hamstrings: Right 80 deg; Left 86 deg Thomas test: Right 10 deg; Left 8 deg    LOWER EXTREMITY ROM:   Active / Passive ROM Right 11/25/21 A/P Left 11/25/21 A/P Rt 01/26/22 A/P  Hip flexion 95/110 105/120 90/ 95  Hip extension 8/15 10/15 12/ 15  Hip abduction '32/38 35/40 35/38 '  Hip adduction       Hip internal rotation '35/40 40/48 44/55 '  Hip external rotation '40/44 42/46 45/50 '  Knee flexion 128/ 132 126/130   Knee extension 0 0    (Blank rows = not tested)   LOWER EXTREMITY MMT:   MMT Right eval Left eval Rt 01/26/22  Hip flexion 4/5 4/5 4/5  Hip extension 5/5 5/5 5/5  Hip abduction 4/5 5/5 4/5  Hip adduction 4/5 5/5 4/5  Hip internal rotation       Hip external  rotation       Knee flexion 5/5 5/5   Knee extension 5/5 5/5    (Blank rows = not tested)   LOWER EXTREMITY SPECIAL TESTS:  Hip special tests: Saralyn Pilar (FABER) test: negative Lateral greater trochanter tenderness/pain with compression in sidelying   FUNCTIONAL TESTS:  11/25/21: 5 times sit to stand: 10 seconds with no UE support   GAIT: Distance walked: 30 feet  Assistive device utilized: None Level of assistance: Complete Independence Comments: pt with mild antalgic gait       TODAY'S TREATMENT: 01/26/22 Therex: NuStep L5 x 7 min Supine bridges x 10 holding 5 seconds Sidelying:reverse clams 2 x 15 Sidelying hip abduction  2 x 15 Seated SLR 2 x 10 Manual: IASTM with biofreeze to Rt IT band and Rt TFL, around incision site   12/22/21 Therex: NuStep L5 x  8 min Seated hamstring stretch bilateral x 2 holding 30 sec Seated Piriformis stretch: bilateral x 2 holding 30 sec Hip flexor lunge stretch x 2 each LE holding 30 sec Mini squats 2 x 10 in parallel bars c UE support Standing c UE support: Hip flexion then ER/ABd knees at 90 degrees x 10 Rt LE only Hip extension x 15 each LE, UE support Sidelying: reverse clam position c feet elevated and then perform clam shell 2 x 10  Manual: skilled palpation and monitoring of soft tissue during DN STM with biofreeze to Rt IT band and Rt TFL  Trigger Point Dry-Needling  Treatment instructions: Expect mild to moderate muscle soreness. S/S of pneumothorax if dry needled over a lung field, and to seek immediate medical attention should they occur. Patient verbalized understanding of these instructions and education.  Patient Consent Given: Yes Education handout provided: previous visit Muscles treated: Rt TFL Treatment response/outcome:multiple twitch responses noted Modalities:  Mosit heat x 5 minutes to Rt lateral hip    12/15/21 Therex: NuStep L5 x 8 min Seated Piriformis stretch: bilateral x 2 holding 30 sec Hip flexor lunge  stretch x 2 each LE holding 30 sec Mini squats 2 x 10 in parallel bars c UE support Hip hike x 10 each LE Hip abduction 2 x 10 each LE, UE support Hip extension 2 x 10 each LE, UE support Leg Press 50 # 3 x 10 bilateral LEs  Manual: STM with compression to Rt glutes and lateral hamstrings; skilled palpation and monitoring of soft tissue during DN  Trigger Point Dry-Needling  Treatment instructions: Expect mild to moderate muscle soreness. S/S of pneumothorax if dry needled over a lung field, and to seek immediate medical attention should they occur. Patient verbalized understanding of these instructions and education.  Patient Consent Given: Yes Education handout provided: previous visit Muscles treated: Rt glute med, min, lateral hamstring Treatment response/outcome: twitch responses with decreased pain reported        PATIENT EDUCATION:  Education details: PT POC, HEP Person educated: Patient Education method: Explanation, Demonstration, Tactile cues, Verbal cues, and Handouts Education comprehension: verbalized understanding, returned demonstration, and needs further education     HOME EXERCISE PROGRAM: Access Code: [email or web address] URL: [email or web address] Date: 11/25/2021 Prepared by: Kearney Hard   Exercises - Supine Bridge  - 2 x daily - 7 x weekly - 10 reps - 3 seconds hold - Supine Piriformis Stretch with Foot on Ground  - 2 x daily - 7 x weekly - 3 reps - 20 seconds hold - Supine Figure 4 Piriformis Stretch  - 2 x daily - 7 x weekly - 3 reps - 20 seconds hold - Supine Lower Trunk Rotation  - 2 x daily - 7 x weekly - 3 reps - 20 seconds hold - Sidelying Hip Abduction  - 2 x daily - 7 x weekly - 2 sets - 10 reps - Standing Hip Flexor Stretch  - 2 x daily - 7 x weekly - 3 reps - 20 seconds hold   ASSESSMENT:   CLINICAL IMPRESSION: Pt arriving today after several weeks since her last therapy session. Pt reporting 3/10 pain in her Rt hip. Pt stating her  overall pain has improved since the TPDN but feels like her limp is worse. Pt still reporting difficulty with household chores. Pt's overall AROM has improved in her Rt hip with the exception of her flexion. Pt's FOTO score has decreased. We discussed importance in continuation of her HEP as well  as home modalities such as ice, heat, and self soft tissue.  Continue with skilled PT to progress toward maximizal functional mobility with re-certification sent for an additional 8 weeks for 1 x/ week up to 7 additional visits.     OBJECTIVE IMPAIRMENTS decreased activity tolerance, decreased balance, decreased mobility, difficulty walking, decreased ROM, decreased strength, impaired flexibility, and pain.    ACTIVITY LIMITATIONS lifting, bending, sitting, sleeping, and stairs   PARTICIPATION LIMITATIONS: community activity   PERSONAL FACTORS asthma, arthritis, DM, GERD HTN, glaucoma both eyes, abdominal hysterectomy,  are also affecting patient's functional outcome.    REHAB POTENTIAL: Good   CLINICAL DECISION MAKING: Stable/uncomplicated   EVALUATION COMPLEXITY: Low     GOALS: Goals reviewed with patient? Yes   SHORT TERM GOALS: Target date: 12/16/2021  Patient will demonstrate independent use of initial home exercise program to maintain progress from in clinic treatments. Goal status: Met 12/08/2021   Long term PT goals: Target date 10/6/ 23 Patient will demonstrate/report pain at worst less than or equal to 2/10 to facilitate minimal limitation in daily activity secondary to pain symptoms. Goal status: ongoing 01/26/22   Patient will demonstrate independent use of home exercise program to facilitate ability to maintain/progress functional gains from skilled physical therapy services. Goal status: ongoing 01/26/22   Patient will demonstrate FOTO outcome > or = 64 % to indicate reduced disability due to condition. Goal status: On-going 01/26/22   Pt will be able to walk for >/= 15 minutes with  normalized gait with no pain reported.  Goal status: ongoing 01/26/22       5.  Pt will improve her right hip flexion and abduction to 5/5 in order to improve functional mobility.   Goal status: On-going 01/26/22     PLAN: PT FREQUENCY: 1x/week    PT DURATION: 8 weeks   PLANNED INTERVENTIONS:   Therapeutic exercises, Therapeutic activity, Neuro Muscular re-education, Balance training, Gait training, Patient/Family education, Joint mobilization, Stair training, DME instructions, Dry Needling, Electrical stimulation, Cryotherapy, Moist heat, Taping, Ultrasound, Ionotophoresis 45m/ml Dexamethasone, and Manual therapy.  All included unless contraindicated   PLAN FOR NEXT SESSION: Continue with Nustep, hip stretching, hip strengthening, IT band stretching, consider DN again     JKearney Hard PT, MPT 01/26/22 10:10 AM   01/26/22 10:10 AM

## 2022-01-28 ENCOUNTER — Encounter (INDEPENDENT_AMBULATORY_CARE_PROVIDER_SITE_OTHER): Payer: Self-pay

## 2022-01-28 ENCOUNTER — Ambulatory Visit
Admission: RE | Admit: 2022-01-28 | Discharge: 2022-01-28 | Disposition: A | Discharge status: Home / Self Care | Payer: BC Managed Care – PPO | Source: Ambulatory Visit | Attending: Pulmonary Disease | Admitting: Pulmonary Disease

## 2022-01-28 DIAGNOSIS — R062 Wheezing: Secondary | ICD-10-CM

## 2022-01-29 ENCOUNTER — Telehealth: Payer: Self-pay

## 2022-01-29 NOTE — Telephone Encounter (Signed)
Renee Hirschfeld,  Do you happen to have the [employer] paperwork for this patient.   I am not sure if she gave it back to me or not when I gave it to her to fill out.   Please advise and thank you

## 2022-01-29 NOTE — Progress Notes (Signed)
CT shoes no sign of collapse of airways which is good. CT is consistent with changes of asthma.  April, can you check on status of [employer]?

## 2022-01-30 NOTE — Telephone Encounter (Signed)
New updated application for [employer] sent in mail to patient with note attached for her to fill out application and send back to office when completed. Nothing further needed

## 2022-02-09 ENCOUNTER — Ambulatory Visit (INDEPENDENT_AMBULATORY_CARE_PROVIDER_SITE_OTHER): Payer: BC Managed Care – PPO

## 2022-02-09 ENCOUNTER — Ambulatory Visit: Payer: BC Managed Care – PPO | Admitting: Physician Assistant

## 2022-02-09 ENCOUNTER — Encounter: Payer: Self-pay | Admitting: Physician Assistant

## 2022-02-09 ENCOUNTER — Telehealth: Payer: Self-pay | Admitting: Physician Assistant

## 2022-02-09 DIAGNOSIS — M25511 Pain in right shoulder: Secondary | ICD-10-CM

## 2022-02-09 MED ORDER — HYDROCODONE-ACETAMINOPHEN 5-325 MG PO TABS: 1.0000 | ORAL_TABLET | Freq: Four times a day (QID) | ORAL | 0 refills | Status: DC | PRN

## 2022-02-09 NOTE — Progress Notes (Signed)
Office Visit Note   Patient: [name]           Date of Birth: 09-19-1955           MRN: [id number] Visit Date: 02/09/2022              Requested by: Antony Contras, MD [place],  [place] [address] PCP: Antony Contras, MD  Chief Complaint  Patient presents with   Right Shoulder - Follow-up      HPI: Patient is a pleasant 66 year old woman with a chief complaint of right shoulder pain.  No difficulty with her shoulders in the past.  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  She said that this morning she fell onto her outstretched hand with pain in her shoulder.  Denies any wrist pain any elbow pain or any distal humerus pain.  Denies any neck pain.  She describes her pain as 9 out of 10 hurts more with lifting her arm overhead  Assessment & Plan: Visit Diagnoses:  1. Acute pain of right shoulder     Plan: X-rays are reassuring she does have a downsloping acromion.  I do think maybe she has irritation of the rotator cuff.  With such an acute acute injury difficult to say.  I have instructed her in doing pendulum exercises and wall crawls with her arm.  I will also provide her a sling for comfort I will give her a small amount of hydrocodone  Follow-Up Instructions: Return in about 1 week (around 02/16/2022) for Dr Durward Fortes.   Ortho Exam  Patient is alert, oriented, no adenopathy, well-dressed, normal affect, normal respiratory effort. Examination of her right shoulder she has no ecchymosis no bruising.  No tenderness to palpation over the humerus.  She does have significant pain with overhead motion of her arm but is able to hold her arm there.  She is extremely tender with empty can test.  She can slowly go behind her back to about the belt line.  No pain with range of motion of her neck.  Distal strength is intact sensation is intact  Imaging: XR Shoulder Right  Result Date: 02/09/2022 Radiographs of her right shoulder were reviewed today.  No  evidence of an fracture.  She does have hypertrophic bone at the [facility] joint and a downsloping acromion.  No evidence of dislocation.  Humeral head does not ride high  No images are attached to the encounter.  Labs: Lab Results  Component Value Date   HGBA1C 7.7 (H) 03/29/2019   HGBA1C 6.8 (H) 08/02/2017   REPTSTATUS 08/16/2015 FINAL 08/13/2015   GRAMSTAIN  09/11/2007    NO WBC SEEN NO SQUAMOUS EPITHELIAL CELLS SEEN NO ORGANISMS SEEN   CULT  08/13/2015    >=100,000 COLONIES/mL KLEBSIELLA PNEUMONIAE Performed at [facility] 08/13/2015     Lab Results  Component Value Date   ALBUMIN 3.9 03/28/2019   ALBUMIN 4.0 06/19/2018   ALBUMIN 3.8 06/17/2018    Lab Results  Component Value Date   MG 1.7 08/14/2015   Lab Results  Component Value Date   VD25OH 25.7 (L) 04/24/2019   VD25OH 33.1 01/06/2018   VD25OH 17.9 (L) 08/02/2017    No results found for: "PREALBUMIN"    Latest Ref Rng & Units 03/15/2021    3:39 PM 10/30/2020    3:56 PM 03/28/2019    1:28 PM  CBC EXTENDED  WBC 4.0 - 10.5 K/uL  7.0  4.7    RBC 3.87 - 5.11 MIL/uL 4.06  4.18    Hemoglobin 12.0 - 15.0 g/dL 12.1  12.5  14.6   HCT 36.0 - 46.0 % 38.1  37.4  43.0   Platelets 150 - 400 K/uL 269  266.0    NEUT# 1.7 - 7.7 K/uL 6.2  1.9    Lymph# 0.7 - 4.0 K/uL 0.4  1.8       There is no height or weight on file to calculate BMI.  Orders:  Orders Placed This Encounter  Procedures   XR Shoulder Right   No orders of the defined types were placed in this encounter.    Procedures: No procedures performed  Clinical Data: No additional findings.  ROS:  All other systems negative, except as noted in the HPI. Review of Systems  Objective: Vital Signs: There were no vitals taken for this visit.  Specialty Comments:  No specialty comments available.  PMFS History: Patient Active Problem List   Diagnosis Date Noted   Pain in right shoulder 02/09/2022   Asthma  exacerbation 09/19/2020   Anemia 10/13/2019   Glaucoma 10/13/2019   Hypertensive disorder 10/13/2019   Uterine leiomyoma 10/13/2019   Vitamin D insufficiency 04/26/2019   TIA (transient ischemic attack) 03/30/2019   Hyperlipidemia 03/30/2019   Thyroid nodule 03/30/2019   Stroke-like episode s/p IV tPA 03/28/2019   GERD (gastroesophageal reflux disease) 06/19/2018   Constipation 06/19/2018   Other fatigue 08/02/2017   Shortness of breath on exertion 08/02/2017   Mass of axilla 07/07/2017   Abdominal pain, epigastric    Abnormal CT of the abdomen    Pancreatitis, acute    Acute pancreatitis 08/13/2015   UTI (lower urinary tract infection) 08/13/2015   Severe obesity (BMI >= 40) ([place]) 01/23/2015   Sinusitis, chronic 01/08/2015   Cough variant asthma 01/03/2015   Arthritis of right hip 12/12/2013   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   Benign neoplasm of colon 08/22/2013   Special screening for malignant neoplasms, colon 08/22/2013   Abdominal pain 08/19/2013   Diabetes mellitus without complication ([place]) 35/36/1443   Essential hypertension, benign 08/19/2013   Ventral hernia 08/19/2013   Past Medical History:  Diagnosis Date   Anemia 2006   required transfusion post TAH/BSO 05/2005   Arthritis    Asthma    Chronic cough    Colon polyps    hyperplastic   Diabetes mellitus without complication (HCC)    Diverticulosis    GERD (gastroesophageal reflux disease)    on prilosec   GLA deficiency (HCC)    Glaucoma of both eyes    Hypertension    Joint pain    Osteoarthritis    Pancreatitis    Shortness of breath dyspnea    Vitamin D deficiency     Family History  Problem Relation Age of Onset   Emphysema Mother        smoked   Diabetes Mother    Hypertension Mother    Colon cancer Father        7-s   Hypertension Father    Heart disease Father    High Cholesterol Father    Breast cancer Sister     Past Surgical History:  Procedure Laterality Date    ABDOMINAL HYSTERECTOMY     BREAST EXCISIONAL BIOPSY Right 1990   COLONOSCOPY N/A 08/22/2013   Procedure: COLONOSCOPY;  Surgeon: Irene Shipper, MD;  Location: [place];  Service: Endoscopy;  Laterality: N/A;  ESOPHAGOGASTRODUODENOSCOPY N/A 08/22/2013   Procedure: ESOPHAGOGASTRODUODENOSCOPY (EGD);  Surgeon: Irene Shipper, MD;  Location: [facility] ENDOSCOPY;  Service: Endoscopy;  Laterality: N/A;   ESOPHAGOGASTRODUODENOSCOPY (EGD) WITH PROPOFOL N/A 08/19/2015   Procedure: ESOPHAGOGASTRODUODENOSCOPY (EGD) WITH PROPOFOL;  Surgeon: Irene Shipper, MD;  Location: [facility] ENDOSCOPY;  Service: Endoscopy;  Laterality: N/A;   HERNIA REPAIR     HYSTEROSCOPY WITH D & C  01/2005   for uterine fibroids.    INSERTION OF MESH N/A 08/24/2013   Procedure: INSERTION OF MESH;  Surgeon: Edward Jolly, MD;  Location: [place];  Service: General;  Laterality: N/A;   JOINT REPLACEMENT Right    LIPOMA EXCISION Left 06/21/2015   Procedure: EXCISION OF LEFT SCALP LIPOMA;  Surgeon: Johnathan Hausen, MD;  Location: [place];  Service: General;  Laterality: Left;   PANNICULECTOMY N/A 08/24/2013   Procedure: PANNICULECTOMY;  Surgeon: Edward Jolly, MD;  Location: [place];  Service: General;  Laterality: N/A;   TOTAL ABDOMINAL HYSTERECTOMY W/ BILATERAL SALPINGOOPHORECTOMY  05/2005   TOTAL HIP ARTHROPLASTY Right 12/12/2013   Procedure: RIGHT TOTAL HIP ARTHROPLASTY ANTERIOR APPROACH;  Surgeon: Mcarthur Rossetti, MD;  Location: [place];  Service: Orthopedics;  Laterality: Right;   VENTRAL HERNIA REPAIR N/A 08/24/2013   Procedure: HERNIA REPAIR VENTRAL ADULT;  Surgeon: Edward Jolly, MD;  Location: [place];  Service: General;  Laterality: N/A;   Social History   Occupational History   Occupation: Education officer, museum  Tobacco Use   Smoking status: Former    Packs/day: 0.25    Years: 15.00    Total pack years: 3.75    Types: Cigarettes    Quit date: 06/22/1978    Years since quitting: 43.6   Smokeless tobacco: Never   Vaping Use   Vaping Use: Never used  Substance and Sexual Activity   Alcohol use: No    Alcohol/week: 0.0 standard drinks of alcohol   Drug use: No   Sexual activity: Not on file

## 2022-02-09 NOTE — Telephone Encounter (Signed)
Pt called requesting to call pharmacy for pre authorization. Please send before 6 pm at her pharmacy on file. Pt phone number is [phone]

## 2022-02-10 ENCOUNTER — Encounter: Payer: Self-pay | Admitting: Physical Therapy

## 2022-02-10 ENCOUNTER — Ambulatory Visit (INDEPENDENT_AMBULATORY_CARE_PROVIDER_SITE_OTHER): Payer: BC Managed Care – PPO | Admitting: Physical Therapy

## 2022-02-10 DIAGNOSIS — M25551 Pain in right hip: Secondary | ICD-10-CM | POA: Diagnosis not present

## 2022-02-10 DIAGNOSIS — M6281 Muscle weakness (generalized): Secondary | ICD-10-CM

## 2022-02-10 DIAGNOSIS — R262 Difficulty in walking, not elsewhere classified: Secondary | ICD-10-CM | POA: Diagnosis not present

## 2022-02-10 NOTE — Telephone Encounter (Signed)
Prior Authorization was started through cover my meds for the patient. Waiting on approval and then will contact patient once this has been approved or denied.

## 2022-02-10 NOTE — Therapy (Addendum)
OUTPATIENT PHYSICAL THERAPY TREATMENT NOTE RE-CERTIFICATION Discharge   Patient Name: [name] MRN: [id number] DOB:May 26, 1956, 66 y.o., female Today's Date: 02/10/2022  PCP: Antony Contras MD REFERRING PROVIDER: Jean Rosenthal MD  END OF SESSION:   PT End of Session - 02/10/22 1604     Visit Number 6    Number of Visits 13    Date for PT Re-Evaluation 03/27/22    Authorization Type BCBS, REcert sent on 07/29/784 for extension of time    PT Start Time 1557    PT Stop Time 1635    PT Time Calculation (min) 38 min    Activity Tolerance Patient tolerated treatment well    Behavior During Therapy [facility] for tasks assessed/performed                Past Medical History:  Diagnosis Date   Anemia 2006   required transfusion post TAH/BSO 05/2005   Arthritis    Asthma    Chronic cough    Colon polyps    hyperplastic   Diabetes mellitus without complication (HCC)    Diverticulosis    GERD (gastroesophageal reflux disease)    on prilosec   GLA deficiency ([place])    Glaucoma of both eyes    Hypertension    Joint pain    Osteoarthritis    Pancreatitis    Shortness of breath dyspnea    Vitamin D deficiency    Past Surgical History:  Procedure Laterality Date   ABDOMINAL HYSTERECTOMY     BREAST EXCISIONAL BIOPSY Right 1990   COLONOSCOPY N/A 08/22/2013   Procedure: COLONOSCOPY;  Surgeon: Irene Shipper, MD;  Location: [place];  Service: Endoscopy;  Laterality: N/A;   ESOPHAGOGASTRODUODENOSCOPY N/A 08/22/2013   Procedure: ESOPHAGOGASTRODUODENOSCOPY (EGD);  Surgeon: Irene Shipper, MD;  Location[facility] ENDOSCOPY;  Service: Endoscopy;  Laterality: N/A;   ESOPHAGOGASTRODUODENOSCOPY (EGD) WITH PROPOFOL N/A 08/19/2015   Procedure: ESOPHAGOGASTRODUODENOSCOPY (EGD) WITH PROPOFOL;  Surgeon: Irene Shipper, MD;  Location: [facility] ENDOSCOPY;  Service: Endoscopy;  Laterality: N/A;   HERNIA REPAIR     HYSTEROSCOPY WITH D & C  01/2005   for uterine fibroids.    INSERTION OF MESH N/A  08/24/2013   Procedure: INSERTION OF MESH;  Surgeon: Edward Jolly, MD;  Location: [place];  Service: General;  Laterality: N/A;   JOINT REPLACEMENT Right    LIPOMA EXCISION Left 06/21/2015   Procedure: EXCISION OF LEFT SCALP LIPOMA;  Surgeon: Johnathan Hausen, MD;  Location: [place];  Service: General;  Laterality: Left;   PANNICULECTOMY N/A 08/24/2013   Procedure: PANNICULECTOMY;  Surgeon: Edward Jolly, MD;  Location: [place];  Service: General;  Laterality: N/A;   TOTAL ABDOMINAL HYSTERECTOMY W/ BILATERAL SALPINGOOPHORECTOMY  05/2005   TOTAL HIP ARTHROPLASTY Right 12/12/2013   Procedure: RIGHT TOTAL HIP ARTHROPLASTY ANTERIOR APPROACH;  Surgeon: Mcarthur Rossetti, MD;  Location: [place];  Service: Orthopedics;  Laterality: Right;   VENTRAL HERNIA REPAIR N/A 08/24/2013   Procedure: HERNIA REPAIR VENTRAL ADULT;  Surgeon: Edward Jolly, MD;  Location: [place];  Service: General;  Laterality: N/A;   Patient Active Problem List   Diagnosis Date Noted   Pain in right shoulder 02/09/2022   Asthma exacerbation 09/19/2020   Anemia 10/13/2019   Glaucoma 10/13/2019   Hypertensive disorder 10/13/2019   Uterine leiomyoma 10/13/2019   Vitamin D insufficiency 04/26/2019   TIA (transient ischemic attack) 03/30/2019   Hyperlipidemia 03/30/2019   Thyroid nodule 03/30/2019   Stroke-like episode s/p  IV tPA 03/28/2019   GERD (gastroesophageal reflux disease) 06/19/2018   Constipation 06/19/2018   Other fatigue 08/02/2017   Shortness of breath on exertion 08/02/2017   Mass of axilla 07/07/2017   Abdominal pain, epigastric    Abnormal CT of the abdomen    Pancreatitis, acute    Acute pancreatitis 08/13/2015   UTI (lower urinary tract infection) 08/13/2015   Severe obesity (BMI >= 40) ([place]) 01/23/2015   Sinusitis, chronic 01/08/2015   Cough variant asthma 01/03/2015   Arthritis of right hip 12/12/2013   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   Benign neoplasm  of colon 08/22/2013   Special screening for malignant neoplasms, colon 08/22/2013   Abdominal pain 08/19/2013   Diabetes mellitus without complication ([place]) 58/83/2549   Essential hypertension, benign 08/19/2013   Ventral hernia 08/19/2013    REFERRING DIAG: M25.551 (ICD-10-CM) - Pain in right hip M70.61 (ICD-10-CM) - Trochanteric bursitis, right hip  THERAPY DIAG:  Pain in right hip  Muscle weakness (generalized)  Difficulty in walking, not elsewhere classified  Rationale for Evaluation and Treatment Rehabilitation  PERTINENT HISTORY:  Hip injection on 08/27/2021 PMH: asthma, arthritis, DM, GERD HTN, glaucoma both eyes, abdominal hysterectomy,   PRECAUTIONS: None   SUBJECTIVE: Pt arriving stating she had fall onto Rt arm and is in 9/10 pain, she saw MD about this (XR were negative) yesterday. She still gets some hip pain and limps at time   PAIN:  Are you having pain? Yes: NPRS scale: 3/10 Pain location: Rt hip Pain description: sharp aching Aggravating factors: unknown Relieving factors: exercises   OBJECTIVE: (objective measures completed at initial evaluation unless otherwise dated)   PATIENT SURVEYS:  11/25/2021: FOTO 59% (predicted 64%) 12/22/2021: FOTO 56%  01/26/22: FOTO 51%    MUSCLE LENGTH:   11/25/21:Hamstrings: Right 80 deg; Left 86 deg Thomas test: Right 10 deg; Left 8 deg    LOWER EXTREMITY ROM:   Active / Passive ROM Right 11/25/21 A/P Left 11/25/21 A/P Rt 01/26/22 A/P  Hip flexion 95/110 105/120 90/ 95  Hip extension 8/15 10/15 12/ 15  Hip abduction '32/38 35/40 35/38 '  Hip adduction       Hip internal rotation '35/40 40/48 44/55 '  Hip external rotation '40/44 42/46 45/50 '  Knee flexion 128/ 132 126/130   Knee extension 0 0    (Blank rows = not tested)   LOWER EXTREMITY MMT:   MMT Right eval Left eval Rt 01/26/22  Hip flexion 4/5 4/5 4/5  Hip extension 5/5 5/5 5/5  Hip abduction 4/5 5/5 4/5  Hip adduction 4/5 5/5 4/5  Hip internal rotation        Hip external rotation       Knee flexion 5/5 5/5   Knee extension 5/5 5/5    (Blank rows = not tested)   LOWER EXTREMITY SPECIAL TESTS:  Hip special tests: Saralyn Pilar (FABER) test: negative Lateral greater trochanter tenderness/pain with compression in sidelying   FUNCTIONAL TESTS:  11/25/21: 5 times sit to stand: 10 seconds with no UE support   GAIT: Distance walked: 30 feet  Assistive device utilized: None Level of assistance: Complete Independence Comments: pt with mild antalgic gait       TODAY'S TREATMENT: 02/10/22 -Nu step L5 X 8 min -Standing hip abd X 10 bilat -Standing hip extensions X 10 bilat -Standing hip marches X 10 bilat -Supine bridges 5 sec X10 -Supine piriformis/ITB stretch knee to opposite shoulder 30 sec X 3 -Supine clams green 2X10 -Supine hip add  isometric ball squeeze 5 sec X 15  -Manual theray: IASTM with massage gun to Rt hip and IT band X 8 min  01/26/22 Therex: NuStep L5 x 7 min Supine bridges x 10 holding 5 seconds Sidelying:reverse clams 2 x 15 Sidelying hip abduction  2 x 15 Seated SLR 2 x 10 Manual: IASTM with biofreeze to Rt IT band and Rt TFL, around incision site   12/22/21 Therex: NuStep L5 x 8 min Seated hamstring stretch bilateral x 2 holding 30 sec Seated Piriformis stretch: bilateral x 2 holding 30 sec Hip flexor lunge stretch x 2 each LE holding 30 sec Mini squats 2 x 10 in parallel bars c UE support Standing c UE support: Hip flexion then ER/ABd knees at 90 degrees x 10 Rt LE only Hip extension x 15 each LE, UE support Sidelying: reverse clam position c feet elevated and then perform clam shell 2 x 10  Manual: skilled palpation and monitoring of soft tissue during DN STM with biofreeze to Rt IT band and Rt TFL  Trigger Point Dry-Needling  Treatment instructions: Expect mild to moderate muscle soreness. S/S of pneumothorax if dry needled over a lung field, and to seek immediate medical attention should they occur. Patient  verbalized understanding of these instructions and education.  Patient Consent Given: Yes Education handout provided: previous visit Muscles treated: Rt TFL Treatment response/outcome:multiple twitch responses noted Modalities:  Mosit heat x 5 minutes to Rt lateral hip    12/15/21 Therex: NuStep L5 x 8 min Seated Piriformis stretch: bilateral x 2 holding 30 sec Hip flexor lunge stretch x 2 each LE holding 30 sec Mini squats 2 x 10 in parallel bars c UE support Hip hike x 10 each LE Hip abduction 2 x 10 each LE, UE support Hip extension 2 x 10 each LE, UE support Leg Press 50 # 3 x 10 bilateral LEs  Manual: STM with compression to Rt glutes and lateral hamstrings; skilled palpation and monitoring of soft tissue during DN  Trigger Point Dry-Needling  Treatment instructions: Expect mild to moderate muscle soreness. S/S of pneumothorax if dry needled over a lung field, and to seek immediate medical attention should they occur. Patient verbalized understanding of these instructions and education.  Patient Consent Given: Yes Education handout provided: previous visit Muscles treated: Rt glute med, min, lateral hamstring Treatment response/outcome: twitch responses with decreased pain reported        PATIENT EDUCATION:  Education details: PT POC, HEP Person educated: Patient Education method: Explanation, Demonstration, Tactile cues, Verbal cues, and Handouts Education comprehension: verbalized understanding, returned demonstration, and needs further education     HOME EXERCISE PROGRAM: Access Code: [email or web address] URL: [email or web address] Date: 11/25/2021 Prepared by: Kearney Hard   Exercises - Supine Bridge  - 2 x daily - 7 x weekly - 10 reps - 3 seconds hold - Supine Piriformis Stretch with Foot on Ground  - 2 x daily - 7 x weekly - 3 reps - 20 seconds hold - Supine Figure 4 Piriformis Stretch  - 2 x daily - 7 x weekly - 3 reps - 20 seconds hold - Supine  Lower Trunk Rotation  - 2 x daily - 7 x weekly - 3 reps - 20 seconds hold - Sidelying Hip Abduction  - 2 x daily - 7 x weekly - 2 sets - 10 reps - Standing Hip Flexor Stretch  - 2 x daily - 7 x weekly - 3 reps - 20 seconds hold   ASSESSMENT:  CLINICAL IMPRESSION: She had more pain after a recent fall. She has been checked out by MD and had negative XR. She had good tolerance to hip exercises today and I trialed massage gun to her Rt hip which did appear to give her some relief.    OBJECTIVE IMPAIRMENTS decreased activity tolerance, decreased balance, decreased mobility, difficulty walking, decreased ROM, decreased strength, impaired flexibility, and pain.    ACTIVITY LIMITATIONS lifting, bending, sitting, sleeping, and stairs   PARTICIPATION LIMITATIONS: community activity   PERSONAL FACTORS asthma, arthritis, DM, GERD HTN, glaucoma both eyes, abdominal hysterectomy,  are also affecting patient's functional outcome.    REHAB POTENTIAL: Good   CLINICAL DECISION MAKING: Stable/uncomplicated   EVALUATION COMPLEXITY: Low     GOALS: Goals reviewed with patient? Yes   SHORT TERM GOALS: Target date: 12/16/2021  Patient will demonstrate independent use of initial home exercise program to maintain progress from in clinic treatments. Goal status: Met 12/08/2021   Long term PT goals: Target date 10/6/ 23 Patient will demonstrate/report pain at worst less than or equal to 2/10 to facilitate minimal limitation in daily activity secondary to pain symptoms. Goal status: ongoing 01/26/22   Patient will demonstrate independent use of home exercise program to facilitate ability to maintain/progress functional gains from skilled physical therapy services. Goal status: ongoing 01/26/22   Patient will demonstrate FOTO outcome > or = 64 % to indicate reduced disability due to condition. Goal status: On-going 01/26/22   Pt will be able to walk for >/= 15 minutes with normalized gait with no pain reported.   Goal status: ongoing 01/26/22       5.  Pt will improve her right hip flexion and abduction to 5/5 in order to improve functional mobility.   Goal status: On-going 01/26/22     PLAN: PT FREQUENCY: 1x/week    PT DURATION: 8 weeks   PLANNED INTERVENTIONS:   Therapeutic exercises, Therapeutic activity, Neuro Muscular re-education, Balance training, Gait training, Patient/Family education, Joint mobilization, Stair training, DME instructions, Dry Needling, Electrical stimulation, Cryotherapy, Moist heat, Taping, Ultrasound, Ionotophoresis 55m/ml Dexamethasone, and Manual therapy.  All included unless contraindicated   PLAN FOR NEXT SESSION: update goals and measurments, hip stretching, hip strengthening, IT band stretching, consider DN or massage gun.   BElsie Ra PT, DPT 02/10/22 4:36 PM PHYSICAL THERAPY DISCHARGE SUMMARY  Visits from Start of Care: 6  Current functional level related to goals / functional outcomes: See above   Remaining deficits: See above   Education / Equipment: HEP   Patient agrees to discharge. Patient goals were partially met. Patient is being discharged due to not returning since the last visit.   JKearney Hard PT, MPT 04/08/22 11:40 AM

## 2022-02-13 ENCOUNTER — Telehealth: Payer: Self-pay | Admitting: Pharmacist

## 2022-02-13 NOTE — Telephone Encounter (Signed)
Patient returned call and stated that [facility] has messed up her Xolair shipment twice now. She only received Xolair '150mg'$  syringe and not enough of the '75mg'$  syringes. Pharmacist will be reaching out to resolution team to request an additional '75mg'$  syringe be over-nighted to patient. Pharmacist has marked in patient's chart to double count her Xolair.  Knox Saliva, PharmD, MPH, BCPS, CPP Clinical Pharmacist (Rheumatology and Pulmonology)

## 2022-02-13 NOTE — Telephone Encounter (Signed)
Left VM for patient to inquire if she will be switching to Medicare in November as this will likely require her to receive Tezspire at the infusion center.  Knox Saliva, PharmD, MPH, BCPS, CPP Clinical Pharmacist (Rheumatology and Pulmonology)

## 2022-02-13 NOTE — Telephone Encounter (Signed)
Please start Tezspire autoinjector BIV.  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  She is eligible for copay card once approved through insurance  Knox Saliva, PharmD, MPH, BCPS, CPP Clinical Pharmacist (Rheumatology and Pulmonology)

## 2022-02-16 ENCOUNTER — Other Ambulatory Visit (HOSPITAL_COMMUNITY): Payer: Self-pay

## 2022-02-16 NOTE — Telephone Encounter (Signed)
Received notification from [facility] regarding a prior authorization for [place]. Authorization has been APPROVED from 02/16/2022 to 08/19/2022.   Patient must fill through [facility]: [phone]  Authorization # [id number]  Will complete PAP application in preparation for submission.

## 2022-02-16 NOTE — Telephone Encounter (Signed)
Submitted a Prior Authorization request to [facility] for TEZSPIRE via CoverMyMeds. Will update once we receive a response.   Key: [id number]

## 2022-02-16 NOTE — Telephone Encounter (Signed)
Submitted Patient Assistance Application to  [employer]  for [employer] along with provider portion, PA and income documents. Will update patient when we receive a response.  Fax# [phone] Phone# [phone]

## 2022-02-18 ENCOUNTER — Encounter: Payer: Self-pay | Admitting: Orthopaedic Surgery

## 2022-02-18 ENCOUNTER — Ambulatory Visit (INDEPENDENT_AMBULATORY_CARE_PROVIDER_SITE_OTHER): Payer: BC Managed Care – PPO | Admitting: Orthopaedic Surgery

## 2022-02-18 ENCOUNTER — Other Ambulatory Visit: Payer: Self-pay | Admitting: Emergency Medicine

## 2022-02-18 ENCOUNTER — Telehealth: Payer: Self-pay | Admitting: Pulmonary Disease

## 2022-02-18 DIAGNOSIS — M25511 Pain in right shoulder: Secondary | ICD-10-CM | POA: Diagnosis not present

## 2022-02-18 MED ORDER — METHYLPREDNISOLONE ACETATE 40 MG/ML IJ SUSP
80.0000 mg | INTRAMUSCULAR | Status: AC | PRN
  Administered 2022-02-18: 80 mg via INTRA_ARTICULAR

## 2022-02-18 MED ORDER — LIDOCAINE HCL 1 % IJ SOLN
2.0000 mL | INTRAMUSCULAR | Status: AC | PRN
  Administered 2022-02-18: 2 mL

## 2022-02-18 MED ORDER — BUPIVACAINE HCL 0.25 % IJ SOLN
2.0000 mL | INTRAMUSCULAR | Status: AC | PRN
  Administered 2022-02-18: 2 mL via INTRA_ARTICULAR

## 2022-02-18 NOTE — Telephone Encounter (Signed)
Patient called to request an urgent refill for her Prednisone.  She stated she is have some SOB and needs that medication asap.  She would like it sent to [facility] on [address] before 6pm, anything after 6 should be sent to the [facility] location.

## 2022-02-18 NOTE — Progress Notes (Signed)
Office Visit Note   Patient: [name]           Date of Birth: Aug 10, 1955           MRN: [id number] Visit Date: 02/18/2022              Requested by: Antony Contras, MD [place],  [place] [address] PCP: Antony Contras, MD   Assessment & Plan: Visit Diagnoses:  1. Acute pain of right shoulder     Plan: [name] is a 66 year old woman who is here to follow-up on her right shoulder.  She is 9 days status post falling onto her outstretched arm.  Denied any wrist or elbow pain.  Had focal pain in her right shoulder.  She has no previous history of shoulder difficulties.  Her x-ray does show some hypertrophic bone at the [facility] joint and some calcification in the supraspinatus but neither of these are acute findings.  Did not show any fractures.  She has been immobilized in a sling and coming out to do pendulum exercises.  We will go forward with a cortisone injection today.  She understands to monitor her blood sugars as she is a diabetic.  She will see how she does over the next few weeks sling can be used for comfort but may come out as much as she wants.  If she is no better she will contact us and we will order an MRI. She does have an area of resolving ecchymosis in the mid arm and there is a possibility she may have ruptured the biceps tendon.  Difficult to tell by exam  Follow-Up Instructions: Return in about 3 weeks (around 03/11/2022).   Orders:  Orders Placed This Encounter  Procedures   Large Joint Inj: R subacromial bursa   No orders of the defined types were placed in this encounter.     Procedures: Large Joint Inj: R subacromial bursa on 02/18/2022 4:21 PM Indications: diagnostic evaluation and pain Details: 25 G 1.5 in needle, anterolateral approach  Arthrogram: No  Medications: 2 mL lidocaine 1 %; 80 mg methylPREDNISolone acetate 40 MG/ML; 2 mL bupivacaine 0.25 % Outcome: tolerated well, no immediate complications Procedure, treatment  alternatives, risks and benefits explained, specific risks discussed. Consent was given by the patient.      Clinical Data: No additional findings.   Subjective: Chief Complaint  Patient presents with   Right Shoulder - Pain  Patient presents today for a one week follow up on her right shoulder. She said she has gotten no relief. She continues to hurt. She is wearing her sling. She is taking hydrocodone or tylenol as needed.     Review of Systems  All other systems reviewed and are negative.    Objective: Vital Signs: There were no vitals taken for this visit.  Physical Exam Constitutional:      Appearance: Normal appearance.  Pulmonary:     Effort: Pulmonary effort is normal.  Skin:    General: Skin is warm and dry.  Neurological:     Mental Status: She is alert.     Ortho Exam Examination of her right shoulder.  She has no neck pain.  She has no paresthesias in her hand no tenderness in her wrist or elbow.  She does have pain with forward elevation but is able to hold her arm above her head.  Painful internal rotation behind the back.  Strength is difficult to extend to assess because of  her pain.  But she is able to hold her arms out in abduction.  Good grip and release Specialty Comments:  No specialty comments available.  Imaging: No results found.   PMFS History: Patient Active Problem List   Diagnosis Date Noted   Pain in right shoulder 02/09/2022   Asthma exacerbation 09/19/2020   Anemia 10/13/2019   Glaucoma 10/13/2019   Hypertensive disorder 10/13/2019   Uterine leiomyoma 10/13/2019   Vitamin D insufficiency 04/26/2019   TIA (transient ischemic attack) 03/30/2019   Hyperlipidemia 03/30/2019   Thyroid nodule 03/30/2019   Stroke-like episode s/p IV tPA 03/28/2019   GERD (gastroesophageal reflux disease) 06/19/2018   Constipation 06/19/2018   Other fatigue 08/02/2017   Shortness of breath on exertion 08/02/2017   Mass of axilla 07/07/2017    Abdominal pain, epigastric    Abnormal CT of the abdomen    Pancreatitis, acute    Acute pancreatitis 08/13/2015   UTI (lower urinary tract infection) 08/13/2015   Severe obesity (BMI >= 40) ([place]) 01/23/2015   Sinusitis, chronic 01/08/2015   Cough variant asthma 01/03/2015   Arthritis of right hip 12/12/2013   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   Benign neoplasm of colon 08/22/2013   Special screening for malignant neoplasms, colon 08/22/2013   Abdominal pain 08/19/2013   Diabetes mellitus without complication ([place]) 27/51/7001   Essential hypertension, benign 08/19/2013   Ventral hernia 08/19/2013   Past Medical History:  Diagnosis Date   Anemia 2006   required transfusion post TAH/BSO 05/2005   Arthritis    Asthma    Chronic cough    Colon polyps    hyperplastic   Diabetes mellitus without complication (HCC)    Diverticulosis    GERD (gastroesophageal reflux disease)    on prilosec   GLA deficiency (HCC)    Glaucoma of both eyes    Hypertension    Joint pain    Osteoarthritis    Pancreatitis    Shortness of breath dyspnea    Vitamin D deficiency     Family History  Problem Relation Age of Onset   Emphysema Mother        smoked   Diabetes Mother    Hypertension Mother    Colon cancer Father        7-s   Hypertension Father    Heart disease Father    High Cholesterol Father    Breast cancer Sister     Past Surgical History:  Procedure Laterality Date   ABDOMINAL HYSTERECTOMY     BREAST EXCISIONAL BIOPSY Right 1990   COLONOSCOPY N/A 08/22/2013   Procedure: COLONOSCOPY;  Surgeon: Irene Shipper, MD;  Location: [facility];  Service: Endoscopy;  Laterality: N/A;   ESOPHAGOGASTRODUODENOSCOPY N/A 08/22/2013   Procedure: ESOPHAGOGASTRODUODENOSCOPY (EGD);  Surgeon: Irene Shipper, MD;  Location[facility]OSCOPY;  Service: Endoscopy;  Laterality: N/A;   ESOPHAGOGASTRODUODENOSCOPY (EGD) WITH PROPOFOL N/A 08/19/2015   Procedure: ESOPHAGOGASTRODUODENOSCOPY (EGD)  WITH PROPOFOL;  Surgeon: Irene Shipper, MD;  Location: [facility] ENDOSCOPY;  Service: Endoscopy;  Laterality: N/A;   HERNIA REPAIR     HYSTEROSCOPY WITH D & C  01/2005   for uterine fibroids.    INSERTION OF MESH N/A 08/24/2013   Procedure: INSERTION OF MESH;  Surgeon: Edward Jolly, MD;  Location: [facility];  Service: General;  Laterality: N/A;   JOINT REPLACEMENT Right    LIPOMA EXCISION Left 06/21/2015   Procedure: EXCISION OF LEFT SCALP LIPOMA;  Surgeon: Johnathan Hausen, MD;  Location: [facility];  Service: General;  Laterality: Left;   PANNICULECTOMY N/A 08/24/2013   Procedure: PANNICULECTOMY;  Surgeon: Edward Jolly, MD;  Location: [facility];  Service: General;  Laterality: N/A;   TOTAL ABDOMINAL HYSTERECTOMY W/ BILATERAL SALPINGOOPHORECTOMY  05/2005   TOTAL HIP ARTHROPLASTY Right 12/12/2013   Procedure: RIGHT TOTAL HIP ARTHROPLASTY ANTERIOR APPROACH;  Surgeon: Mcarthur Rossetti, MD;  Location: [facility];  Service: Orthopedics;  Laterality: Right;   VENTRAL HERNIA REPAIR N/A 08/24/2013   Procedure: HERNIA REPAIR VENTRAL ADULT;  Surgeon: Edward Jolly, MD;  Location: [facility];  Service: General;  Laterality: N/A;   Social History   Occupational History   Occupation: Education officer, museum  Tobacco Use   Smoking status: Former    Packs/day: 0.25    Years: 15.00    Total pack years: 3.75    Types: Cigarettes    Quit date: 06/22/1978    Years since quitting: 43.6   Smokeless tobacco: Never  Vaping Use   Vaping Use: Never used  Substance and Sexual Activity   Alcohol use: No    Alcohol/week: 0.0 standard drinks of alcohol   Drug use: No   Sexual activity: Not on file

## 2022-02-19 NOTE — Telephone Encounter (Signed)
Called and spoke with pt who states she began wheezing about 1 week ago and states that yesterday 8/30 was the worst. Pt said she almost went to the ED 8/30 but was able to make it home and did a neb treatment. Pt said she ended up doing two neb treatments which helped. Pt is using all other meds as prescribed.  Due to the wheezing, pt is requesting a refill of the prednisone. Dr. Silas Flood, please advise on this for pt.

## 2022-02-19 NOTE — Telephone Encounter (Signed)
Prednisone 40 mg x 5 days. Please schedule visit with me or APP next available. Multiple exacerbations on Xolair. Consider switch to [employer].

## 2022-02-20 ENCOUNTER — Telehealth: Payer: Self-pay | Admitting: Pulmonary Disease

## 2022-02-20 MED ORDER — PREDNISONE 20 MG PO TABS: 40.0000 mg | ORAL_TABLET | Freq: Every day | ORAL | 0 refills | Status: DC

## 2022-02-20 NOTE — Telephone Encounter (Signed)
MyChart message sent to patient requesting she reach out to [facility] to complete copay card enrollment  Knox Saliva, PharmD, MPH, BCPS, CPP Clinical Pharmacist (Rheumatology and Pulmonology)

## 2022-02-20 NOTE — Telephone Encounter (Signed)
Called and spoke with pt letting her know recs per [place] and she verbalized understanding. Meds hav been sent to pharmacy for pt and appt has been scheduled. Nothing further needed.

## 2022-02-20 NOTE — Telephone Encounter (Signed)
Per portal, information is missing. Called [facility] to inquire what information is missing.  Per rep, patient has to provide verbal consent to receive copay card information. ATC patient but she picked up and then was on another line. Sent MyChart message to patient with the next steps that she has to take  Phone: [phone]  Knox Saliva, PharmD, MPH, BCPS, CPP Clinical Pharmacist (Rheumatology and Pulmonology)

## 2022-02-25 ENCOUNTER — Encounter: Payer: BC Managed Care – PPO | Admitting: Physical Therapy

## 2022-02-25 ENCOUNTER — Telehealth: Payer: Self-pay | Admitting: Physical Therapy

## 2022-02-25 NOTE — Telephone Encounter (Signed)
Pt did not show for PT appointment today. They were contacted and informed of this and she states she had her days mixed up. This was the last visit she had scheduled with PT so so she was instructed to call our office back to receptionist can set her up with more PT if she feels she wants to continue.    Elsie Ra, PT, DPT 02/25/22 4:20 PM

## 2022-02-27 ENCOUNTER — Encounter: Payer: Self-pay | Admitting: Primary Care

## 2022-02-27 ENCOUNTER — Ambulatory Visit (INDEPENDENT_AMBULATORY_CARE_PROVIDER_SITE_OTHER): Payer: BC Managed Care – PPO | Admitting: Primary Care

## 2022-02-27 DIAGNOSIS — J45998 Other asthma: Secondary | ICD-10-CM | POA: Diagnosis not present

## 2022-02-27 NOTE — Telephone Encounter (Signed)
Patient brought [employer] copay card information to [facility] today  BIN: [id number] PCN: [facility] ID- [id number] Group: [id number]  Scheduled for [employer] new start on 03/09/22. Will use sample.  Patient will plan to hold her Xolair dose that is due on Friday, 03/06/22  Knox Saliva, PharmD, MPH, BCPS, CPP Clinical Pharmacist (Rheumatology and Pulmonology)

## 2022-02-27 NOTE — Telephone Encounter (Signed)
Patient was in contact with [facility] today and received copay card  RX bin : [phone] Rx PCN: [facility] ID- [id number] Rx group: [id number]

## 2022-02-27 NOTE — Patient Instructions (Signed)
I have reached out to our pharmacist with information you provided me from your co-pay card for [employer]. For now continue Xolair injections until we get new medication. No other changes. Glad you are feeling better after prednisone

## 2022-02-27 NOTE — Assessment & Plan Note (Addendum)
-   She continues to have frequent exacerbations despite being on Xolair injections every 2 weeks. Her last exacerbation was on 02/19/22 which resolved with 5 day course of prednisone. Patient is in the process of changing from Xolair to [place]. She has received copay card. Continue Symbicort 140mg two puffs twice daily and Singulair '10mg'$  at bedtime.

## 2022-02-27 NOTE — Progress Notes (Signed)
$'@Patient'y$  ID: [name], female    DOB: Sep 17, 1955, 66 y.o.   MRN: [id number]  No chief complaint on file.   Referring provider: Antony Contras, MD  HPI: 66 y.o. woman who previously followed in pulmonary clinic with Dr. Melvyn Novas with asthma here for follow up of DOE felt to be poorly controlled asthma exacerbated by COVID infection.  Most recent pulmonary note 11/2021 with Dr. Lamonte Sakai, acute visit reviewed.  Over the last 2 months, persistent wheeze.  Particularly in the evenings.  Sometimes when lies down.  She continues on Xolair.  Unfortunately poor control over the last 2 months.  2 courses of prednisone, about once a month over the last 2 months.  Helps for period of time but wheeze persists.  Cough also back a bit.  Xolair had been adequate controlling this for a period of time.  HPI at initial visit: Patient has been diagnosed with asthma for years.  Usually relatively well controlled.  Uses Symbicort mid dose.  About once or twice a years with changes states since, spring and fall she has worsening breathing.  This occurred earlier in the month.  All of a sudden she had significant chest tightness, wheeze, shortness of breath.  Associated with hacking cough.  Dry.  Went to urgent care and received a dose of IV steroids.  Her symptoms have improved a good deal but still has lingering cough and mild chest tightness, shortness of breath.  No fever or chills.  No sick contacts.  Denies any viral symptoms such as sore throat, runny nose, fever at time of onset of symptoms.  When asked, she does think maybe she had a little more nasal congestion on the days preceding this acute change.  Chest x-ray 03/04/2020 reviewed and interpreted as clear lungs with mild hyperinflation with increased AP diameter on lateral film.  Prior labs reviewed which are notable for elevated IgE greater than 1200 and eosinophils as high as 800 in the past.  PMH: Asthma, diabetes, history of pancreatitis Surgical history:  Hysterectomy Family history: Reviewed, denies significant family history of respiratory illness Social history: Former smoker, 4-pack-year smoking history quit 40 years ago, grew up in [place], Education officer, museum to [employer], raising 4 grandchildren all girls  02/27/2022 Patient comes in today to discuss changing [facility] from [place] to test prior. There are several telephone messages in chart in regards to application for [employer]. She dropped off [employer] program enrollment form on 02/13/2022.  On 02/19/2022 she was sent in a 5-day course of prednisone for mild exacerbation of her asthma symptoms, patient was experiencing wheezing for the last week. She is in the process of enrolling for co-pay assistance, she spoke with [facility] and received copay card today. She continues Xolair injections, next due on Sept 15th. She completed prednisone course which resolved her wheezing.    Allergies  Allergen Reactions   Naproxen     "hives, I had to call 911"    Diclofenac     Hives    Penicillins Hives    Has patient had a PCN reaction causing immediate rash, facial/tongue/throat swelling, SOB or lightheadedness with hypotension: Yes Has patient had a PCN reaction causing severe rash involving mucus membranes or skin necrosis: Yes Has patient had a PCN reaction that required hospitalization No Has patient had a PCN reaction occurring within the last 10 years: No. If all of the above answers are "NO", then may proceed with Cephalosporin use.     Immunization History  Administered Date(s) Administered   Influenza Split 03/22/2014   Influenza,inj,Quad PF,6+ Mos 04/19/2017, 05/08/2018, 03/18/2019   Influenza,inj,quad, With Preservative 04/23/2019, 03/22/2020   PFIZER(Purple Top)SARS-COV-2 Vaccination 08/19/2019, 09/09/2019   Tdap 05/12/2021    Past Medical History:  Diagnosis Date   Anemia 2006   required transfusion post TAH/BSO 05/2005   Arthritis    Asthma    Chronic cough     Colon polyps    hyperplastic   Diabetes mellitus without complication (HCC)    Diverticulosis    GERD (gastroesophageal reflux disease)    on prilosec   GLA deficiency (HCC)    Glaucoma of both eyes    Hypertension    Joint pain    Osteoarthritis    Pancreatitis    Shortness of breath dyspnea    Vitamin D deficiency     Tobacco History: Social History   Tobacco Use  Smoking Status Former   Packs/day: 0.25   Years: 15.00   Total pack years: 3.75   Types: Cigarettes   Quit date: 06/22/1978   Years since quitting: 43.7  Smokeless Tobacco Never   Counseling given: Not Answered   Outpatient Medications Prior to Visit  Medication Sig Dispense Refill   albuterol (PROVENTIL) (2.5 MG/3ML) 0.083% nebulizer solution Take 3 mLs (2.5 mg total) by nebulization every 6 (six) hours as needed for wheezing or shortness of breath. 75 mL 0   aspirin EC 81 MG EC tablet Take 1 tablet (81 mg total) by mouth daily.     atorvastatin (LIPITOR) 20 MG tablet Take 1 tablet (20 mg total) by mouth daily at 6 PM. 30 tablet 2   budesonide-formoterol (SYMBICORT) 160-4.5 MCG/ACT inhaler Inhale 2 puffs into the lungs in the morning and at bedtime. 1 each 12   cetirizine (ZYRTEC ALLERGY) 10 MG tablet Take 1 tablet (10 mg total) by mouth daily. 30 tablet 2   chlorpheniramine (CHLOR-TRIMETON) 4 MG tablet Take 1 tablet (4 mg total) by mouth every 6 (six) hours as needed for allergies. 30 tablet 0   EPINEPHrine 0.3 mg/0.3 mL IJ SOAJ injection Inject 0.3 mg into the muscle as needed for anaphylaxis. 1 each 2   fluticasone (FLONASE) 50 MCG/ACT nasal spray Place 1 spray into both nostrils daily. 15 mL 2   glucose blood test strip Use as instructed 100 each 1   HYDROcodone-acetaminophen (NORCO/VICODIN) 5-325 MG tablet Take 1 tablet by mouth every 6 (six) hours as needed for moderate pain. 20 tablet 0   MELATONIN PO Take 1 tablet by mouth at bedtime.     metFORMIN (GLUCOPHAGE) 1000 MG tablet Take 1,000 mg by mouth 2  (two) times daily with a meal.     montelukast (SINGULAIR) 10 MG tablet TAKE 1 TABLET(10 MG) BY MOUTH AT BEDTIME 30 tablet 11   omalizumab (XOLAIR) 150 MG/ML prefilled syringe INJECT 2 SYRINGES UNDER THE SKIN EVERY 2 WEEKS. TOTAL DOSE = 375 MG 12 mL 1   omalizumab (XOLAIR) 75 MG/0.5ML prefilled syringe INJECT 1 SYRINGE UNDER THE SKIN EVERY 2 WEEKS. TOTAL DOSE = 375 MG 6 mL 1   omeprazole (PRILOSEC) 20 MG capsule Take 1 capsule (20 mg total) by mouth in the morning and at bedtime. 60 capsule 2   pioglitazone (ACTOS) 15 MG tablet Take 15 mg by mouth daily.     Travoprost, BAK Free, (TRAVATAN) 0.004 % SOLN ophthalmic solution      predniSONE (DELTASONE) 20 MG tablet Take 2 tablets (40 mg total) by mouth daily with breakfast. 10  tablet 0   No facility-administered medications prior to visit.    Review of Systems  Review of Systems  Constitutional: Negative.   HENT: Negative.    Respiratory: Negative.    Cardiovascular: Negative.     Physical Exam  BP 110/64 (BP Location: Left Arm, Patient Position: Sitting, Cuff Size: Normal)   Pulse 68   Temp 97.8 F (36.6 C) (Oral)   Ht '5\' 2"'$  (1.575 m)   Wt 174 lb 12.8 oz (79.3 kg)   SpO2 99%   BMI 31.97 kg/m  Physical Exam Constitutional:      Appearance: Normal appearance.  HENT:     Head: Normocephalic and atraumatic.     Mouth/Throat:     Mouth: Mucous membranes are moist.     Pharynx: Oropharynx is clear.  Cardiovascular:     Rate and Rhythm: Normal rate and regular rhythm.  Pulmonary:     Effort: Pulmonary effort is normal.     Breath sounds: Normal breath sounds. No wheezing or rales.  Skin:    General: Skin is warm and dry.  Neurological:     General: No focal deficit present.     Mental Status: She is alert and oriented to person, place, and time. Mental status is at baseline.  Psychiatric:        Mood and Affect: Mood normal.        Behavior: Behavior normal.        Thought Content: Thought content normal.         Judgment: Judgment normal.      Lab Results:  CBC    Component Value Date/Time   WBC 7.0 03/15/2021 1539   RBC 4.06 03/15/2021 1539   HGB 12.1 03/15/2021 1539   HGB 13.4 08/02/2017 1035   HCT 38.1 03/15/2021 1539   HCT 40.7 08/02/2017 1035   PLT 269 03/15/2021 1539   MCV 93.8 03/15/2021 1539   MCV 86 08/02/2017 1035   MCH 29.8 03/15/2021 1539   MCHC 31.8 03/15/2021 1539   RDW 14.2 03/15/2021 1539   RDW 14.9 08/02/2017 1035   LYMPHSABS 0.4 (L) 03/15/2021 1539   LYMPHSABS 2.2 08/02/2017 1035   MONOABS 0.3 03/15/2021 1539   EOSABS 0.0 03/15/2021 1539   EOSABS 0.2 08/02/2017 1035   BASOSABS 0.0 03/15/2021 1539   BASOSABS 0.0 08/02/2017 1035    BMET    Component Value Date/Time   NA 138 03/15/2021 1539   NA 141 08/02/2017 1035   K 3.7 03/15/2021 1539   CL 106 03/15/2021 1539   CO2 21 (L) 03/15/2021 1539   GLUCOSE 493 (H) 03/15/2021 1539   BUN 13 03/15/2021 1539   BUN 14 08/02/2017 1035   CREATININE 1.06 (H) 03/15/2021 1539   CALCIUM 9.3 03/15/2021 1539   GFRNONAA 59 (L) 03/15/2021 1539   GFRAA >60 03/28/2019 1326    BNP No results found for: "BNP"  ProBNP No results found for: "PROBNP"  Imaging: XR Shoulder Right  Result Date: 02/09/2022 Radiographs of her right shoulder were reviewed today.  No evidence of an fracture.  She does have hypertrophic bone at the [facility] joint and a downsloping acromion.  No evidence of dislocation.  Humeral head does not ride high    Assessment & Plan:   Poorly controlled persistent asthma - She continues to have frequent exacerbations despite being on Xolair injections every 2 weeks. Her last exacerbation was on 02/19/22 which resolved with 5 day course of prednisone. Patient is in the process of changing  from Xolair to [employer]. She has received copay card. Continue Symbicort 148mg two puffs twice daily and Singulair '10mg'$  at bedtime.    EMartyn Ehrich NP 02/27/2022

## 2022-03-05 NOTE — Progress Notes (Unsigned)
HPI Patient presents today to [facility] to see pharmacy team for [facility] new start.  Past medical history includes ***  Number of hospitalizations in past year: Number of ***COPD/asthma exacerbations in past year:   Respiratory Medications Current regimen: *** Tried in past: *** Patient reports {Adherence challenges yes no:3044014::"adherence challenges","no known adherence challenges"}  OBJECTIVE Allergies  Allergen Reactions   Naproxen     "hives, I had to call 911"    Diclofenac     Hives    Penicillins Hives    Has patient had a PCN reaction causing immediate rash, facial/tongue/throat swelling, SOB or lightheadedness with hypotension: Yes Has patient had a PCN reaction causing severe rash involving mucus membranes or skin necrosis: Yes Has patient had a PCN reaction that required hospitalization No Has patient had a PCN reaction occurring within the last 10 years: No. If all of the above answers are "NO", then may proceed with Cephalosporin use.     Outpatient Encounter Medications as of 03/09/2022  Medication Sig   albuterol (PROVENTIL) (2.5 MG/3ML) 0.083% nebulizer solution Take 3 mLs (2.5 mg total) by nebulization every 6 (six) hours as needed for wheezing or shortness of breath.   aspirin EC 81 MG EC tablet Take 1 tablet (81 mg total) by mouth daily.   atorvastatin (LIPITOR) 20 MG tablet Take 1 tablet (20 mg total) by mouth daily at 6 PM.   budesonide-formoterol (SYMBICORT) 160-4.5 MCG/ACT inhaler Inhale 2 puffs into the lungs in the morning and at bedtime.   cetirizine (ZYRTEC ALLERGY) 10 MG tablet Take 1 tablet (10 mg total) by mouth daily.   chlorpheniramine (CHLOR-TRIMETON) 4 MG tablet Take 1 tablet (4 mg total) by mouth every 6 (six) hours as needed for allergies.   EPINEPHrine 0.3 mg/0.3 mL IJ SOAJ injection Inject 0.3 mg into the muscle as needed for anaphylaxis.   fluticasone (FLONASE) 50 MCG/ACT nasal spray Place 1 spray into both nostrils daily.    glucose blood test strip Use as instructed   HYDROcodone-acetaminophen (NORCO/VICODIN) 5-325 MG tablet Take 1 tablet by mouth every 6 (six) hours as needed for moderate pain.   MELATONIN PO Take 1 tablet by mouth at bedtime.   metFORMIN (GLUCOPHAGE) 1000 MG tablet Take 1,000 mg by mouth 2 (two) times daily with a meal.   montelukast (SINGULAIR) 10 MG tablet TAKE 1 TABLET(10 MG) BY MOUTH AT BEDTIME   omalizumab (XOLAIR) 150 MG/ML prefilled syringe INJECT 2 SYRINGES UNDER THE SKIN EVERY 2 WEEKS. TOTAL DOSE = 375 MG   omalizumab (XOLAIR) 75 MG/0.5ML prefilled syringe INJECT 1 SYRINGE UNDER THE SKIN EVERY 2 WEEKS. TOTAL DOSE = 375 MG   omeprazole (PRILOSEC) 20 MG capsule Take 1 capsule (20 mg total) by mouth in the morning and at bedtime.   pioglitazone (ACTOS) 15 MG tablet Take 15 mg by mouth daily.   Travoprost, BAK Free, (TRAVATAN) 0.004 % SOLN ophthalmic solution    No facility-administered encounter medications on file as of 03/09/2022.     Immunization History  Administered Date(s) Administered   Influenza Split 03/22/2014   Influenza,inj,Quad PF,6+ Mos 04/19/2017, 05/08/2018, 03/18/2019   Influenza,inj,quad, With Preservative 04/23/2019, 03/22/2020   PFIZER(Purple Top)SARS-COV-2 Vaccination 08/19/2019, 09/09/2019   Tdap 05/12/2021     PFTs     No data to display           Eosinophils Most recent blood eosinophil count was *** cells/microL taken on ***.   IgE: *** on ***   Assessment   Biologics training  for tezepulumab Cheron Every)  Goals of therapy: Mechanism: human monoclonal IgG2? antibody that binds to TSLP. This blocks TSLP from its effect on inflammation including reduce eosinophils, IgE, FeNO, IL-5, and IL-13. Mechanism is not definitively established. Reviewed that Cheron Every is add-on medication and patient must continue maintenance inhaler regimen. Response to therapy: may take 3-4 months to determine efficacy.  Side effects: injection site reaction (6-18%),  antibody development (2%), arthralgia (4%), back pain (4%), pharyngitis (4%)  Dose: Tezspire 210 mg once every 4 weeks  Administration/Storage:  Reviewed administration sites of thigh or abdomen (at least 2-3 inches away from abdomen). Reviewed the upper arm is only appropriate if caregiver is administering injection  Do not shake pen/syringe as this could lead to product foaming or precipitation. Do not shake syringe as this could lead to product foaming or precipitation.  Access: Approval of Tezspire through: {specialtycoverage:25706} Patient enrolled into copay card program to help with copay assistance.  Patient self-administered Tezspire '210mg'$ /1.91 ml in {injsitedsg:28167} using sample Tezspire '210mg'$ /1.91 ml Autoinjector pen NDC: *** Lot: *** Expiration: ***  Patient monitored for 30 minutes for adverse reaction.  Patient tolerated ***. Injection site checked and no ***.  Medication Reconciliation  A drug regimen assessment was performed, including review of allergies, interactions, disease-state management, dosing and immunization history. Medications were reviewed with the patient, including name, instructions, indication, goals of therapy, potential side effects, importance of adherence, and safe use.  Drug interaction(s): ***  Immunizations  Patient is indicated for the influenzae, pneumonia, and shingles vaccinations. Patient has received *** COVID19 vaccines.   PLAN Continue Tezspire '210mg'$  SQ every 28 days.  Rx sent to: {SpecialtyPharmacies:25705}.   Continue maintenance asthma regimen of: ***  All questions encouraged and answered.  Instructed patient to reach out with any further questions or concerns.  Thank you for allowing pharmacy to participate in this patient's care.  This appointment required *** minutes of patient care (this includes precharting, chart review, review of results, face-to-face care, etc.).  Knox Saliva, PharmD, MPH, BCPS, CPP Clinical  Pharmacist (Rheumatology and Pulmonology)

## 2022-03-09 ENCOUNTER — Ambulatory Visit: Payer: BC Managed Care – PPO | Admitting: Pharmacist

## 2022-03-09 DIAGNOSIS — Z7189 Other specified counseling: Secondary | ICD-10-CM

## 2022-03-09 DIAGNOSIS — J45998 Other asthma: Secondary | ICD-10-CM

## 2022-03-09 DIAGNOSIS — J45991 Cough variant asthma: Secondary | ICD-10-CM

## 2022-03-09 MED ORDER — TEZSPIRE 210 MG/1.91ML ~~LOC~~ SOAJ: 210.0000 mg | SUBCUTANEOUS | 5 refills | Status: DC

## 2022-03-09 NOTE — Patient Instructions (Signed)
Your next TEZSPIRE dose is due on 04/06/22, 05/04/22, and every 4 weeks thereafter  CONTINUE Symbicort 160-4.5 mcg (2 puffs twice daily), montelukast '10mg'$  nightly, Flonase nasal spray  Your prescription will be shipped from [facility]. Their phone number is [phone] Please call to schedule shipment and confirm address. They will mail your medication to your home.  Your copay should be affordable. If you call the pharmacy and it is not affordable, please double-check that they are billing through your copay card as secondary coverage. That copay card information is: BIN: [id number] PCN: OHCP ID: [id number] Group: [id number]  You will need to be seen by your provider in 3 to 4 months to assess how TEZSPIRE is working for you. Please ensure you have a follow-up appointment scheduled in December 2023 or January 2024. Call our clinic if you need to make this appointment.  How to manage an injection site reaction: Remember the 5 C's: COUNTER - leave on the counter at least 30 minutes but up to overnight to bring medication to room temperature. This may help prevent stinging COLD - place something cold (like an ice gel pack or cold water bottle) on the injection site just before cleansing with alcohol. This may help reduce pain CLARITIN - use Claritin (generic name is loratadine) for the first two weeks of treatment or the day of, the day before, and the day after injecting. This will help to minimize injection site reactions CORTISONE CREAM - apply if injection site is irritated and itching CALL ME - if injection site reaction is bigger than the size of your fist, looks infected, blisters, or if you develop hives

## 2022-03-17 ENCOUNTER — Encounter: Payer: Self-pay | Admitting: Physician Assistant

## 2022-03-17 ENCOUNTER — Ambulatory Visit (INDEPENDENT_AMBULATORY_CARE_PROVIDER_SITE_OTHER): Payer: BC Managed Care – PPO | Admitting: Physician Assistant

## 2022-03-17 DIAGNOSIS — S46011D Strain of muscle(s) and tendon(s) of the rotator cuff of right shoulder, subsequent encounter: Secondary | ICD-10-CM

## 2022-03-17 NOTE — Progress Notes (Signed)
Office Visit Note   Patient: [name]           Date of Birth: 07/27/1955           MRN: [id number] Visit Date: 03/17/2022              Requested by: Antony Contras, MD [address] [place],  [place] [address] PCP: Antony Contras, MD   Assessment & Plan: Visit Diagnoses:  1. Traumatic incomplete tear of right rotator cuff, subsequent encounter     Plan: [name] is a pleasant 67 year old woman who is now almost 6 weeks status post falling onto her outstretched right arm.  She did have ecchymosis and bruising at the time.  She was immobilized in a sling and given pendulum exercises to do.  She was compliant with this.  She cannot take a lot of anti-inflammatories.  She also tried topical medication.  She still did not improve.  She states she is now having more night pain and is unable to sleep.  She was seen and evaluated by Dr. Durward Fortes at her last visit.  He did try a subacromial injection.  She states this only gave her relief for about a week and now the pain has returned.  She is right-handed.  She has pain with trying to bring her arm overhead behind her back and she is getting worried because she feels like it hurts enough that it causes weakness in her hand and she has difficulty holding onto things such as a jug of milk.  She has failed conservative treatment this is now been going on for 6 weeks or so.  Given her failure to progress and failed to improve with steroid injection and the ecchymosis she had at the time of the fall we recommend an MRI.  Order this and I will order this today.  She will follow-up with Dr. Durward Fortes once the MRI is completed.  She said she does have a little claustrophobia she is considering whether she can get a ride to the MRI and back she will contact me if she wishes a Valium certainly we can call this in for her but she knows she needs to have a ride to and from the study  Follow-Up Instructions: No follow-ups on file.   Orders:   Orders Placed This Encounter  Procedures   MR SHOULDER RIGHT WO CONTRAST   No orders of the defined types were placed in this encounter.     Procedures: No procedures performed   Clinical Data: No additional findings.   Subjective: Chief Complaint  Patient presents with   Right Shoulder - Follow-up    HPI [name] is a 66 year old woman who is 3 weeks status post subacromial injection for right shoulder pain with Dr. Durward Fortes.  She states that the pain has not really improved she only got minimal relief for about a week.  She has been doing pendulum exercises.  Review of Systems  All other systems reviewed and are negative.    Objective: Vital Signs: There were no vitals taken for this visit.  Physical Exam Constitutional:      Appearance: Normal appearance.  Pulmonary:     Effort: Pulmonary effort is normal.  Neurological:     General: No focal deficit present.     Mental Status: She is alert.  Psychiatric:        Mood and Affect: Mood normal.     Ortho Exam Right shoulder she is sitting  in a chair rubbing her proximal humerus.  She is able to forward elevate her arm but is very painful for her especially coming down.  Also has pain with internal rotation behind her back.  Sensation distally in her hand is intact.  No pain in her neck.  She has positive impingement findings. Specialty Comments:  No specialty comments available.  Imaging: No results found.   PMFS History: Patient Active Problem List   Diagnosis Date Noted   Poorly controlled persistent asthma 02/27/2022   Pain in right shoulder 02/09/2022   Asthma exacerbation 09/19/2020   Anemia 10/13/2019   Glaucoma 10/13/2019   Hypertensive disorder 10/13/2019   Uterine leiomyoma 10/13/2019   Vitamin D insufficiency 04/26/2019   TIA (transient ischemic attack) 03/30/2019   Hyperlipidemia 03/30/2019   Thyroid nodule 03/30/2019   Stroke-like episode s/p IV tPA 03/28/2019   GERD  (gastroesophageal reflux disease) 06/19/2018   Constipation 06/19/2018   Other fatigue 08/02/2017   Shortness of breath on exertion 08/02/2017   Mass of axilla 07/07/2017   Abdominal pain, epigastric    Abnormal CT of the abdomen    Pancreatitis, acute    Acute pancreatitis 08/13/2015   UTI (lower urinary tract infection) 08/13/2015   Severe obesity (BMI >= 40) ([place]) 01/23/2015   Sinusitis, chronic 01/08/2015   Cough variant asthma 01/03/2015   Arthritis of right hip 12/12/2013   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   Benign neoplasm of colon 08/22/2013   Special screening for malignant neoplasms, colon 08/22/2013   Abdominal pain 08/19/2013   Diabetes mellitus without complication ([place]) 80/32/1224   Essential hypertension, benign 08/19/2013   Ventral hernia 08/19/2013   Past Medical History:  Diagnosis Date   Anemia 2006   required transfusion post TAH/BSO 05/2005   Arthritis    Asthma    Chronic cough    Colon polyps    hyperplastic   Diabetes mellitus without complication (HCC)    Diverticulosis    GERD (gastroesophageal reflux disease)    on prilosec   GLA deficiency (HCC)    Glaucoma of both eyes    Hypertension    Joint pain    Osteoarthritis    Pancreatitis    Shortness of breath dyspnea    Vitamin D deficiency     Family History  Problem Relation Age of Onset   Emphysema Mother        smoked   Diabetes Mother    Hypertension Mother    Colon cancer Father        7-s   Hypertension Father    Heart disease Father    High Cholesterol Father    Breast cancer Sister     Past Surgical History:  Procedure Laterality Date   ABDOMINAL HYSTERECTOMY     BREAST EXCISIONAL BIOPSY Right 1990   COLONOSCOPY N/A 08/22/2013   Procedure: COLONOSCOPY;  Surgeon: Irene Shipper, MD;  Location: [facility];  Service: Endoscopy;  Laterality: N/A;   ESOPHAGOGASTRODUODENOSCOPY N/A 08/22/2013   Procedure: ESOPHAGOGASTRODUODENOSCOPY (EGD);  Surgeon: Irene Shipper,  MD;  Location: [facility] ENDOSCOPY;  Service: Endoscopy;  Laterality: N/A;   ESOPHAGOGASTRODUODENOSCOPY (EGD) WITH PROPOFOL N/A 08/19/2015   Procedure: ESOPHAGOGASTRODUODENOSCOPY (EGD) WITH PROPOFOL;  Surgeon: Irene Shipper, MD;  Location: [facility] ENDOSCOPY;  Service: Endoscopy;  Laterality: N/A;   HERNIA REPAIR     HYSTEROSCOPY WITH D & C  01/2005   for uterine fibroids.    INSERTION OF MESH N/A 08/24/2013   Procedure: INSERTION OF  MESH;  Surgeon: Edward Jolly, MD;  Location: [facility];  Service: General;  Laterality: N/A;   JOINT REPLACEMENT Right    LIPOMA EXCISION Left 06/21/2015   Procedure: EXCISION OF LEFT SCALP LIPOMA;  Surgeon: Johnathan Hausen, MD;  Location: [place];  Service: General;  Laterality: Left;   PANNICULECTOMY N/A 08/24/2013   Procedure: PANNICULECTOMY;  Surgeon: Edward Jolly, MD;  Location: [facility];  Service: General;  Laterality: N/A;   TOTAL ABDOMINAL HYSTERECTOMY W/ BILATERAL SALPINGOOPHORECTOMY  05/2005   TOTAL HIP ARTHROPLASTY Right 12/12/2013   Procedure: RIGHT TOTAL HIP ARTHROPLASTY ANTERIOR APPROACH;  Surgeon: Mcarthur Rossetti, MD;  Location: [facility];  Service: Orthopedics;  Laterality: Right;   VENTRAL HERNIA REPAIR N/A 08/24/2013   Procedure: HERNIA REPAIR VENTRAL ADULT;  Surgeon: Edward Jolly, MD;  Location: [facility];  Service: General;  Laterality: N/A;   Social History   Occupational History   Occupation: Education officer, museum  Tobacco Use   Smoking status: Former    Packs/day: 0.25    Years: 15.00    Total pack years: 3.75    Types: Cigarettes    Quit date: 06/22/1978    Years since quitting: 43.7   Smokeless tobacco: Never  Vaping Use   Vaping Use: Never used  Substance and Sexual Activity   Alcohol use: No    Alcohol/week: 0.0 standard drinks of alcohol   Drug use: No   Sexual activity: Not on file

## 2022-03-25 ENCOUNTER — Ambulatory Visit: Payer: BC Managed Care – PPO | Admitting: Pulmonary Disease

## 2022-04-07 ENCOUNTER — Telehealth: Payer: Self-pay | Admitting: Orthopaedic Surgery

## 2022-04-07 ENCOUNTER — Other Ambulatory Visit: Payer: Self-pay | Admitting: Physician Assistant

## 2022-04-07 MED ORDER — DIAZEPAM 10 MG PO TABS: 10.0000 mg | ORAL_TABLET | Freq: Once | ORAL | 0 refills | Status: AC

## 2022-04-07 NOTE — Telephone Encounter (Signed)
I called patient and advised. 

## 2022-04-07 NOTE — Telephone Encounter (Signed)
Patient needs meds called in for anxiety for MRI tomorrow to [facility] on [address]

## 2022-04-09 ENCOUNTER — Ambulatory Visit
Admission: RE | Admit: 2022-04-09 | Discharge: 2022-04-09 | Disposition: A | Discharge status: Home / Self Care | Payer: BC Managed Care – PPO | Source: Ambulatory Visit | Attending: Physician Assistant | Admitting: Physician Assistant

## 2022-04-09 DIAGNOSIS — S46011D Strain of muscle(s) and tendon(s) of the rotator cuff of right shoulder, subsequent encounter: Secondary | ICD-10-CM

## 2022-04-14 ENCOUNTER — Ambulatory Visit: Payer: BC Managed Care – PPO | Admitting: Orthopaedic Surgery

## 2022-04-16 ENCOUNTER — Ambulatory Visit: Payer: BC Managed Care – PPO | Admitting: Orthopedic Surgery

## 2022-04-16 ENCOUNTER — Ambulatory Visit: Payer: Self-pay

## 2022-04-16 DIAGNOSIS — M25511 Pain in right shoulder: Secondary | ICD-10-CM | POA: Diagnosis not present

## 2022-04-16 DIAGNOSIS — M19211 Secondary osteoarthritis, right shoulder: Secondary | ICD-10-CM

## 2022-04-18 ENCOUNTER — Encounter: Payer: Self-pay | Admitting: Orthopedic Surgery

## 2022-04-18 MED ORDER — BUPIVACAINE HCL 0.5 % IJ SOLN
9.0000 mL | INTRAMUSCULAR | Status: AC | PRN
  Administered 2022-04-16: 9 mL via INTRA_ARTICULAR

## 2022-04-18 MED ORDER — METHYLPREDNISOLONE ACETATE 40 MG/ML IJ SUSP
40.0000 mg | INTRAMUSCULAR | Status: AC | PRN
  Administered 2022-04-16: 40 mg via INTRA_ARTICULAR

## 2022-04-18 MED ORDER — LIDOCAINE HCL 1 % IJ SOLN
5.0000 mL | INTRAMUSCULAR | Status: AC | PRN
  Administered 2022-04-16: 5 mL

## 2022-04-18 NOTE — Progress Notes (Signed)
Office Visit Note   Patient: [name]           Date of Birth: 1956/05/17           MRN: [id number] Visit Date: 04/16/2022 Requested by: Antony Contras, MD [place],  [place] [address] PCP: Antony Contras, MD  Subjective: Chief Complaint  Patient presents with   Right Shoulder - Pain    HPI: [name] is a 66 y.o. female who presents to the office reporting right shoulder pain.  She had a fall at the end of August and landed on her shoulder.  She is right-hand dominant.  Pain wakes her from sleep every night.  Reports severe pain.  No prior surgery on the shoulder.  Takes Tylenol without relief.  She also states she fell on the job in November of last year but it did not bother her so much after recovering from that fall.  Pain is a bigger problem than weakness.  She cannot reach up to cabinets.  She has had an MRI scan which shows complete tear of the supraspinatus and infraspinatus tendons with 3.7 cm of retraction.  Complete tear of the intra-articular portion of the long head of the biceps tendon..                ROS: All systems reviewed are negative as they relate to the chief complaint within the history of present illness.  Patient denies fevers or chills.  Assessment & Plan: Visit Diagnoses:  1. 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  She is fairly functional at this time.  Would like to try her with an intra-articular injection under ultrasound guidance.  60-monthreturn.  She is not really interested at all in shoulder replacement.  We will see how she does with this injection and could consider further intervention and evaluation at her return visit.  Injection performed today without complication.  Follow-Up Instructions: No follow-ups on file.   Orders:  Orders Placed This Encounter  Procedures   UK[place]Guided Needle Placement - No Linked Charges    No orders of the defined types were placed in this encounter.     Procedures: Large Joint Inj: R glenohumeral on 04/16/2022 8:15 AM Indications: diagnostic evaluation and pain Details: 18 G 1.5 in needle, ultrasound-guided posterior approach  Arthrogram: No  Medications: 9 mL bupivacaine 0.5 %; 40 mg methylPREDNISolone acetate 40 MG/ML; 5 mL lidocaine 1 % Outcome: tolerated well, no immediate complications Procedure, treatment alternatives, risks and benefits explained, specific risks discussed. Consent was given by the patient. Immediately prior to procedure a time out was called to verify the correct patient, procedure, equipment, support staff and site/side marked as required. Patient was prepped and draped in the usual sterile fashion.       Clinical Data: No additional findings.  Objective: Vital Signs: There were no vitals taken for this visit.  Physical Exam:  Constitutional: Patient appears well-developed HEENT:  Head: Normocephalic Eyes:EOM are normal Neck: Normal range of motion Cardiovascular: Normal rate Pulmonary/chest: Effort normal Neurologic: Patient is alert Skin: Skin is warm Psychiatric: Patient has normal mood and affect  Ortho Exam: Ortho exam demonstrates full active and passive range of motion of the cervical spine.  Right shoulder has weakness to infraspinatus and supraspinatus testing on the right compared to the left at 3- out of 5.  Deltoid is  functional.  Patient does have functional forward flexion and abduction above 90 degrees but somewhat weak as would be expected.  Subscap strength 5+ out of 5.  No definite Popeye deformity present and no discrete tenderness over the biceps tendon.  No tenderness over the [facility] joint.  No other masses lymphadenopathy or skin changes noted in that shoulder girdle region.  Specialty Comments:  No specialty comments available.  Imaging: No results found.   PMFS History: Patient Active Problem List    Diagnosis Date Noted   Poorly controlled persistent asthma 02/27/2022   Pain in right shoulder 02/09/2022   Asthma exacerbation 09/19/2020   Anemia 10/13/2019   Glaucoma 10/13/2019   Hypertensive disorder 10/13/2019   Uterine leiomyoma 10/13/2019   Vitamin D insufficiency 04/26/2019   TIA (transient ischemic attack) 03/30/2019   Hyperlipidemia 03/30/2019   Thyroid nodule 03/30/2019   Stroke-like episode s/p IV tPA 03/28/2019   GERD (gastroesophageal reflux disease) 06/19/2018   Constipation 06/19/2018   Other fatigue 08/02/2017   Shortness of breath on exertion 08/02/2017   Mass of axilla 07/07/2017   Abdominal pain, epigastric    Abnormal CT of the abdomen    Pancreatitis, acute    Acute pancreatitis 08/13/2015   UTI (lower urinary tract infection) 08/13/2015   Severe obesity (BMI >= 40) ([place]) 01/23/2015   Sinusitis, chronic 01/08/2015   Cough variant asthma 01/03/2015   Arthritis of right hip 12/12/2013   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   Benign neoplasm of colon 08/22/2013   Special screening for malignant neoplasms, colon 08/22/2013   Abdominal pain 08/19/2013   Diabetes mellitus without complication ([place]) 27/08/5007   Essential hypertension, benign 08/19/2013   Ventral hernia 08/19/2013   Past Medical History:  Diagnosis Date   Anemia 2006   required transfusion post TAH/BSO 05/2005   Arthritis    Asthma    Chronic cough    Colon polyps    hyperplastic   Diabetes mellitus without complication (HCC)    Diverticulosis    GERD (gastroesophageal reflux disease)    on prilosec   GLA deficiency (HCC)    Glaucoma of both eyes    Hypertension    Joint pain    Osteoarthritis    Pancreatitis    Shortness of breath dyspnea    Vitamin D deficiency     Family History  Problem Relation Age of Onset   Emphysema Mother        smoked   Diabetes Mother    Hypertension Mother    Colon cancer Father        7-s   Hypertension Father    Heart  disease Father    High Cholesterol Father    Breast cancer Sister     Past Surgical History:  Procedure Laterality Date   ABDOMINAL HYSTERECTOMY     BREAST EXCISIONAL BIOPSY Right 1990   COLONOSCOPY N/A 08/22/2013   Procedure: COLONOSCOPY;  Surgeon: Irene Shipper, MD;  Location: [facility];  Service: Endoscopy;  Laterality: N/A;   ESOPHAGOGASTRODUODENOSCOPY N/A 08/22/2013   Procedure: ESOPHAGOGASTRODUODENOSCOPY (EGD);  Surgeon: Irene Shipper, MD;  Location: [facility] ENDOSCOPY;  Service: Endoscopy;  Laterality: N/A;   ESOPHAGOGASTRODUODENOSCOPY (EGD) WITH PROPOFOL N/A 08/19/2015   Procedure: ESOPHAGOGASTRODUODENOSCOPY (EGD) WITH PROPOFOL;  Surgeon: Irene Shipper, MD;  Location: [facility];  Service: Endoscopy;  Laterality: N/A;   HERNIA REPAIR     HYSTEROSCOPY WITH D & C  01/2005   for uterine fibroids.    INSERTION OF  MESH N/A 08/24/2013   Procedure: INSERTION OF MESH;  Surgeon: Edward Jolly, MD;  Location: [facility];  Service: General;  Laterality: N/A;   JOINT REPLACEMENT Right    LIPOMA EXCISION Left 06/21/2015   Procedure: EXCISION OF LEFT SCALP LIPOMA;  Surgeon: Johnathan Hausen, MD;  Location: [facility];  Service: General;  Laterality: Left;   PANNICULECTOMY N/A 08/24/2013   Procedure: PANNICULECTOMY;  Surgeon: Edward Jolly, MD;  Location: [facility];  Service: General;  Laterality: N/A;   TOTAL ABDOMINAL HYSTERECTOMY W/ BILATERAL SALPINGOOPHORECTOMY  05/2005   TOTAL HIP ARTHROPLASTY Right 12/12/2013   Procedure: RIGHT TOTAL HIP ARTHROPLASTY ANTERIOR APPROACH;  Surgeon: Mcarthur Rossetti, MD;  Location: [place];  Service: Orthopedics;  Laterality: Right;   VENTRAL HERNIA REPAIR N/A 08/24/2013   Procedure: HERNIA REPAIR VENTRAL ADULT;  Surgeon: Edward Jolly, MD;  Location: [place];  Service: General;  Laterality: N/A;   Social History   Occupational History   Occupation: Education officer, museum  Tobacco Use   Smoking status: Former    Packs/day: 0.25    Years: 15.00    Total  pack years: 3.75    Types: Cigarettes    Quit date: 06/22/1978    Years since quitting: 43.8   Smokeless tobacco: Never  Vaping Use   Vaping Use: Never used  Substance and Sexual Activity   Alcohol use: No    Alcohol/week: 0.0 standard drinks of alcohol   Drug use: No   Sexual activity: Not on file

## 2022-04-24 ENCOUNTER — Other Ambulatory Visit: Payer: Self-pay

## 2022-04-24 ENCOUNTER — Emergency Department (HOSPITAL_COMMUNITY): Payer: BC Managed Care – PPO

## 2022-04-24 ENCOUNTER — Emergency Department (HOSPITAL_COMMUNITY)
Admission: EM | Admit: 2022-04-24 | Discharge: 2022-04-24 | Disposition: A | Discharge status: Home / Self Care | Payer: BC Managed Care – PPO | Attending: Emergency Medicine | Admitting: Emergency Medicine

## 2022-04-24 DIAGNOSIS — R1084 Generalized abdominal pain: Secondary | ICD-10-CM | POA: Diagnosis present

## 2022-04-24 DIAGNOSIS — E119 Type 2 diabetes mellitus without complications: Secondary | ICD-10-CM | POA: Insufficient documentation

## 2022-04-24 DIAGNOSIS — I10 Essential (primary) hypertension: Secondary | ICD-10-CM | POA: Diagnosis not present

## 2022-04-24 DIAGNOSIS — Z7984 Long term (current) use of oral hypoglycemic drugs: Secondary | ICD-10-CM | POA: Diagnosis not present

## 2022-04-24 DIAGNOSIS — B3741 Candidal cystitis and urethritis: Secondary | ICD-10-CM | POA: Diagnosis not present

## 2022-04-24 DIAGNOSIS — Z79899 Other long term (current) drug therapy: Secondary | ICD-10-CM | POA: Diagnosis not present

## 2022-04-24 DIAGNOSIS — Z7982 Long term (current) use of aspirin: Secondary | ICD-10-CM | POA: Insufficient documentation

## 2022-04-24 LAB — COMPREHENSIVE METABOLIC PANEL
ALT: 13 U/L (ref 0–44)
AST: 15 U/L (ref 15–41)
Albumin: 4.2 g/dL (ref 3.5–5.0)
Alkaline Phosphatase: 73 U/L (ref 38–126)
Anion gap: 8 (ref 5–15)
BUN: 17 mg/dL (ref 8–23)
CO2: 28 mmol/L (ref 22–32)
Calcium: 9.6 mg/dL (ref 8.9–10.3)
Chloride: 106 mmol/L (ref 98–111)
Creatinine, Ser: 0.96 mg/dL (ref 0.44–1.00)
GFR, Estimated: >60 mL/min (ref >60)
Glucose, Bld: 113 mg/dL — ABNORMAL HIGH (ref 70–99)
Potassium: 4.2 mmol/L (ref 3.5–5.1)
Sodium: 142 mmol/L (ref 135–145)
Total Bilirubin: 0.8 mg/dL (ref 0.3–1.2)
Total Protein: 7.3 g/dL (ref 6.5–8.1)

## 2022-04-24 LAB — CBC
HCT: 42.3 % (ref 36.0–46.0)
Hemoglobin: 13.3 g/dL (ref 12.0–15.0)
MCH: 29.6 pg (ref 26.0–34.0)
MCHC: 31.4 g/dL (ref 30.0–36.0)
MCV: 94 fL (ref 80.0–100.0)
Platelets: 279 10*3/uL (ref 150–400)
RBC: 4.5 MIL/uL (ref 3.87–5.11)
RDW: 14.1 % (ref 11.5–15.5)
WBC: 5.6 10*3/uL (ref 4.0–10.5)
nRBC: 0 % (ref 0.0–0.2)

## 2022-04-24 LAB — URINALYSIS, ROUTINE W REFLEX MICROSCOPIC
Bilirubin Urine: NEGATIVE
Glucose, UA: >=500 mg/dL — AB
Hgb urine dipstick: NEGATIVE
Ketones, ur: NEGATIVE mg/dL
Nitrite: NEGATIVE
Protein, ur: NEGATIVE mg/dL
Specific Gravity, Urine: 1.012 (ref 1.005–1.030)
pH: 6 (ref 5.0–8.0)

## 2022-04-24 LAB — LIPASE, BLOOD: Lipase: 37 U/L (ref 11–51)

## 2022-04-24 MED ORDER — IOHEXOL 300 MG/ML  SOLN
100.0000 mL | Freq: Once | INTRAMUSCULAR | Status: AC | PRN
  Administered 2022-04-24: 100 mL via INTRAVENOUS

## 2022-04-24 MED ORDER — MORPHINE SULFATE (PF) 4 MG/ML IV SOLN
4.0000 mg | Freq: Once | INTRAVENOUS | Status: AC
  Administered 2022-04-24: 4 mg via INTRAVENOUS
  Filled 2022-04-24: qty 1

## 2022-04-24 MED ORDER — FLUCONAZOLE 200 MG PO TABS: 200.0000 mg | ORAL_TABLET | Freq: Every day | ORAL | 0 refills | Status: AC

## 2022-04-24 MED ORDER — SODIUM CHLORIDE (PF) 0.9 % IJ SOLN
INTRAMUSCULAR | Status: AC
  Filled 2022-04-24: qty 50

## 2022-04-24 NOTE — ED Triage Notes (Addendum)
Pt repots generalized abd pain, worse with movement x 1 week.  Sent by MD to r/o pancreatitis.  Denies n/v/d

## 2022-04-24 NOTE — Discharge Instructions (Signed)
It was a pleasure taking care of you today.  As discussed, your CT scan was normal.  It did show some diverticulosis without evidence of diverticulitis.  Your urine showed some yeast.  I am sending you home with medication.  Take daily for the next 2 weeks.  Your urine culture is pending.  Follow-up with PCP within the next few days for further evaluation.  You may take over-the-counter ibuprofen or Tylenol as needed for pain.  Return to the ER for any worsening symptoms.

## 2022-04-24 NOTE — ED Provider Notes (Signed)
[facility] DEPT Provider Note   CSN: [id number] Arrival date & time: 04/24/22  1040     History  Chief Complaint  Patient presents with   Abdominal Pain    [name] is a 66 y.o. female with a past medical history significant for hypertension, diabetes, history of pancreatitis, hyperlipidemia, history of TIA who presents to the ED due to generalized abdominal pain x4 days.  Patient evaluated by PCP prior to arrival and sent to the ED to rule out pancreatitis.  Patient admits to a history of pancreatitis and notes it feels similar.  Patient states pain radiates to mid back.  Admits to increased urination.  No dysuria.  Denies alcohol or chronic NSAID use.  No chest pain or shortness of breath.  Previous hernia repair.  Denies nausea, vomiting, diarrhea.  Patient recently started on Januvia.  History obtained from patient and past medical records. No interpreter used during encounter.       Home Medications Prior to Admission medications   Medication Sig Start Date End Date Taking? Authorizing Provider  fluconazole (DIFLUCAN) 200 MG tablet Take 1 tablet (200 mg total) by mouth daily for 14 days. 04/24/22 05/08/22 Yes Eldine Rencher C, PA-C  albuterol (PROVENTIL) (2.5 MG/3ML) 0.083% nebulizer solution Take 3 mLs (2.5 mg total) by nebulization every 6 (six) hours as needed for wheezing or shortness of breath. 06/29/19   Jaynee Eagles, PA-C  aspirin EC 81 MG EC tablet Take 1 tablet (81 mg total) by mouth daily. 03/31/19   Donzetta Starch, NP  atorvastatin (LIPITOR) 20 MG tablet Take 1 tablet (20 mg total) by mouth daily at 6 PM. 03/30/19   Donzetta Starch, NP  budesonide-formoterol (SYMBICORT) 160-4.5 MCG/ACT inhaler Inhale 2 puffs into the lungs in the morning and at bedtime. 02/25/21   Hunsucker, Bonna Gains, MD  cetirizine (ZYRTEC ALLERGY) 10 MG tablet Take 1 tablet (10 mg total) by mouth daily. 08/31/20   Hazel Sams, PA-C  chlorpheniramine (CHLOR-TRIMETON)  4 MG tablet Take 1 tablet (4 mg total) by mouth every 6 (six) hours as needed for allergies. 09/18/20   Martyn Ehrich, NP  EPINEPHrine 0.3 mg/0.3 mL IJ SOAJ injection Inject 0.3 mg into the muscle as needed for anaphylaxis. 01/29/21   Hunsucker, Bonna Gains, MD  fluticasone (FLONASE) 50 MCG/ACT nasal spray Place 1 spray into both nostrils daily. 08/31/20   Marin Roberts E, PA-C  glucose blood test strip Use as instructed 08/25/17   Dennard Nip D, MD  HYDROcodone-acetaminophen (NORCO/VICODIN) 5-325 MG tablet Take 1 tablet by mouth every 6 (six) hours as needed for moderate pain. 02/09/22   Persons, Bevely Palmer, PA  MELATONIN PO Take 1 tablet by mouth at bedtime.    [provider]  metFORMIN (GLUCOPHAGE) 1000 MG tablet Take 1,000 mg by mouth 2 (two) times daily with a meal.    [provider]  montelukast (SINGULAIR) 10 MG tablet TAKE 1 TABLET(10 MG) BY MOUTH AT BEDTIME 05/19/21   Hunsucker, Bonna Gains, MD  omeprazole (PRILOSEC) 20 MG capsule Take 1 capsule (20 mg total) by mouth in the morning and at bedtime. 01/02/22   Collene Gobble, MD  pioglitazone (ACTOS) 15 MG tablet Take 15 mg by mouth daily. 09/06/19   [provider]  Tezepelumab-ekko (TEZSPIRE) 210 MG/1.91ML SOAJ Inject 210 mg into the skin every 28 (twenty-eight) days. 03/09/22   Hunsucker, Bonna Gains, MD  Travoprost, BAK Free, (TRAVATAN) 0.004 % SOLN ophthalmic solution  08/11/19  [provider]      Allergies    Naproxen, Diclofenac, and Penicillins    Review of Systems   Review of Systems  Constitutional:  Negative for chills and fever.  Respiratory:  Negative for shortness of breath.   Cardiovascular:  Negative for chest pain.  Gastrointestinal:  Positive for abdominal pain. Negative for diarrhea, nausea and vomiting.  Genitourinary:  Positive for frequency.  All other systems reviewed and are negative.   Physical Exam Updated Vital Signs BP 121/71 (BP Location: Right Arm)   Pulse (!) 58    Temp 97.8 F (36.6 C) (Oral)   Resp 12   Ht '5\' 2"'$  (1.575 m)   Wt 79 kg   SpO2 100%   BMI 31.85 kg/m  Physical Exam Vitals and nursing note reviewed.  Constitutional:      General: She is not in acute distress.    Appearance: She is not ill-appearing.  HENT:     Head: Normocephalic.  Eyes:     Pupils: Pupils are equal, round, and reactive to light.  Cardiovascular:     Rate and Rhythm: Normal rate and regular rhythm.     Pulses: Normal pulses.     Heart sounds: Normal heart sounds. No murmur heard.    No friction rub. No gallop.  Pulmonary:     Effort: Pulmonary effort is normal.     Breath sounds: Normal breath sounds.  Abdominal:     General: Abdomen is flat. There is no distension.     Palpations: Abdomen is soft.     Tenderness: There is abdominal tenderness. There is no guarding or rebound.     Comments: Diffuse tenderness  Musculoskeletal:        General: Normal range of motion.     Cervical back: Neck supple.  Skin:    General: Skin is warm and dry.  Neurological:     General: No focal deficit present.     Mental Status: She is alert.  Psychiatric:        Mood and Affect: Mood normal.        Behavior: Behavior normal.     ED Results / Procedures / Treatments   Labs (all labs ordered are listed, but only abnormal results are displayed) Labs Reviewed  COMPREHENSIVE METABOLIC PANEL - Abnormal; Notable for the following components:      Result Value   Glucose, Bld 113 (*)    All other components within normal limits  URINALYSIS, ROUTINE W REFLEX MICROSCOPIC - Abnormal; Notable for the following components:   APPearance HAZY (*)    Glucose, UA >=500 (*)    Leukocytes,Ua MODERATE (*)    Bacteria, UA RARE (*)    All other components within normal limits  URINE CULTURE  LIPASE, BLOOD  CBC    EKG EKG Interpretation  Date/Time:  Friday April 24 2022 12:52:00 EDT Ventricular Rate:  55 PR Interval:  113 QRS Duration: 133 QT Interval:  473 QTC  Calculation: 453 R Axis:   47 Text Interpretation: Sinus rhythm Borderline short PR interval Right bundle branch block Confirmed by Pattricia Boss [phone]) on 04/24/2022 3:00:18 PM  Radiology CT ABDOMEN PELVIS W CONTRAST  Result Date: 04/24/2022 CLINICAL DATA:  Generalized abdominal pain EXAM: CT ABDOMEN AND PELVIS WITH CONTRAST TECHNIQUE: Multidetector CT imaging of the abdomen and pelvis was performed using the standard protocol following bolus administration of intravenous contrast. RADIATION DOSE REDUCTION: This exam was performed according to the departmental dose-optimization program which includes automated  exposure control, adjustment of the mA and/or kV according to patient size and/or use of iterative reconstruction technique. CONTRAST:  14m OMNIPAQUE IOHEXOL 300 MG/ML  SOLN COMPARISON:  06/19/2018 FINDINGS: Lower chest: No acute abnormality. Hepatobiliary: No focal liver abnormality is seen. No gallstones, gallbladder wall thickening, or biliary dilatation. Pancreas: Unremarkable. No pancreatic ductal dilatation or surrounding inflammatory changes. Spleen: Normal in size without focal abnormality. Adrenals/Urinary Tract: Unremarkable adrenal glands. Kidneys enhance symmetrically without focal lesion, stone, or hydronephrosis. Ureters are nondilated. Urinary bladder appears unremarkable for the degree of distention. Stomach/Bowel: Stomach is within normal limits. Appendix appears normal. Scattered sigmoid diverticulosis. No evidence of bowel wall thickening, distention, or inflammatory changes. Vascular/Lymphatic: Aortic atherosclerosis. No enlarged abdominal or pelvic lymph nodes. Reproductive: Status post hysterectomy. No adnexal masses. Other: No free fluid. No abdominopelvic fluid collection. No pneumoperitoneum. No abdominal wall hernia. Musculoskeletal: Prior right total hip arthroplasty. Chronic discogenic endplate changes at TE08-14 No new or acute bony findings. IMPRESSION: 1. No acute  abdominopelvic findings. 2. Sigmoid diverticulosis without evidence of acute diverticulitis. 3. Aortic atherosclerosis (ICD10-I70.0). Electronically Signed   By: NDavina PokeD.O.   On: 04/24/2022 14:45    Procedures Procedures    Medications Ordered in ED Medications  sodium chloride (PF) 0.9 % injection (has no administration in time range)  morphine (PF) 4 MG/ML injection 4 mg (4 mg Intravenous Given 04/24/22 1321)  iohexol (OMNIPAQUE) 300 MG/ML solution 100 mL (100 mLs Intravenous Contrast Given 04/24/22 1359)    ED Course/ Medical Decision Making/ A&P Clinical Course as of 04/24/22 1506  Fri Apr 24, 2022  1321 LChalmers GuestMarland Kitchen: MODERATE [CA]  1321 Bacteria, UA(!): RARE [CA]  1345 Budding Yeast: PRESENT [CA]    Clinical Course User Index [CA] ASuzy Bouchard PA-C                           Medical Decision Making Amount and/or Complexity of Data Reviewed Labs: ordered. Decision-making details documented in ED Course. Radiology: ordered and independent interpretation performed. Decision-making details documented in ED Course. ECG/medicine tests: ordered and independent interpretation performed. Decision-making details documented in ED Course.  Risk Prescription drug management.   This patient presents to the ED for concern of abdominal pain, this involves an extensive number of treatment options, and is a complaint that carries with it a high risk of complications and morbidity.  The differential diagnosis includes pancreatitis, acute cholecystitis, appendicitis, bowel obstruction, diverticulitis, etc  66year old female presents to the ED due to generalized abdominal pain x4 days.  Patient evaluated by PCP prior to arrival and sent to the ED to rule out pancreatitis.  History of pancreatitis and states it feels similar.  Pain radiates to back.  No chest pain or shortness of breath.  Denies alcohol or chronic NSAID use.  Upon arrival, patient afebrile, not tachycardic or  hypoxic.  Patient in no acute distress.  Physical exam significant for diffuse tenderness.  Abdominal labs ordered.  CT abdomen to rule out evidence of pancreatitis vs. bowel obstruction vs.colitis vs other etiologies of abdominal pain. Morphine given. EKG to rule out atypical ACS. Low suspicion for dissection.  UA significant for budding yeast, moderate leukocytes, and rare bacteria.  Will treat for candida cystitis with fluconazole 200 mg x2 weeks given some urinary symptoms. Urine culture pending.  EKG demonstrates sinus bradycardia.  No signs of acute ischemia.  Low suspicion for atypical ACS.  Lipase normal at 37.  Doubt pancreatitis.  CMP reassuring.  Normal renal function.  No major electrolyte derangements.  CBC reassuring.  No leukocytosis and normal hemoglobin.  CT abdomen personally reviewed and interpreted which are negative for any acute abnormalities.  Does show sigmoid diverticulosis without evidence of diverticulitis.  Advised patient to take over-the-counter ibuprofen or Tylenol as needed for pain.  Low suspicion for bowel obstruction, appendicitis, diverticulitis, acute cholecystitis, and other emergent etiologies of abdominal pain given normal CT scan.  Advised patient follow-up with PCP if symptoms not improve over the next few days. Strict ED precautions discussed with patient. Patient states understanding and agrees to plan. Patient discharged home in no acute distress and stable vitals        Final Clinical Impression(s) / ED Diagnoses Final diagnoses:  Generalized abdominal pain  Candida cystitis    Rx / DC Orders ED Discharge Orders          Ordered    fluconazole (DIFLUCAN) 200 MG tablet  Daily        04/24/22 1333              Suzy Bouchard, PA-C 04/24/22 1506    Pattricia Boss, MD 05/03/22 1435

## 2022-04-26 LAB — URINE CULTURE

## 2022-05-03 ENCOUNTER — Ambulatory Visit
Admission: EM | Admit: 2022-05-03 | Discharge: 2022-05-03 | Disposition: A | Discharge status: Home / Self Care | Payer: BC Managed Care – PPO | Attending: Physician Assistant | Admitting: Physician Assistant

## 2022-05-03 ENCOUNTER — Ambulatory Visit (INDEPENDENT_AMBULATORY_CARE_PROVIDER_SITE_OTHER): Payer: BC Managed Care – PPO

## 2022-05-03 DIAGNOSIS — M542 Cervicalgia: Secondary | ICD-10-CM | POA: Diagnosis not present

## 2022-05-03 DIAGNOSIS — M545 Low back pain, unspecified: Secondary | ICD-10-CM

## 2022-05-03 DIAGNOSIS — M546 Pain in thoracic spine: Secondary | ICD-10-CM

## 2022-05-03 DIAGNOSIS — M25552 Pain in left hip: Secondary | ICD-10-CM

## 2022-05-03 DIAGNOSIS — R102 Pelvic and perineal pain: Secondary | ICD-10-CM | POA: Diagnosis not present

## 2022-05-03 MED ORDER — PREDNISONE 20 MG PO TABS: 40.0000 mg | ORAL_TABLET | Freq: Every day | ORAL | 0 refills | Status: AC

## 2022-05-03 MED ORDER — TIZANIDINE HCL 4 MG PO TABS: 4.0000 mg | ORAL_TABLET | Freq: Three times a day (TID) | ORAL | 0 refills | Status: DC
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
## 2022-05-03 NOTE — Discharge Instructions (Signed)
  No fracture noted on xray, but cervical spine xray and hip xrays are not the best at detecting fractures.   IF symptoms worsen in any way please report to ED for further evaluation.
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
## 2022-06-08 ENCOUNTER — Ambulatory Visit (INDEPENDENT_AMBULATORY_CARE_PROVIDER_SITE_OTHER): Payer: BC Managed Care – PPO | Admitting: Physician Assistant

## 2022-06-08 ENCOUNTER — Encounter: Payer: Self-pay | Admitting: Physician Assistant

## 2022-06-08 DIAGNOSIS — M25511 Pain in right shoulder: Secondary | ICD-10-CM | POA: Diagnosis not present

## 2022-06-08 MED ORDER — METHYLPREDNISOLONE 4 MG PO TBPK: ORAL_TABLET | ORAL | 0 refills | Status: DC

## 2022-06-08 MED ORDER — HYDROCODONE-ACETAMINOPHEN 5-325 MG PO TABS: 1.0000 | ORAL_TABLET | ORAL | 0 refills | Status: DC | PRN

## 2022-06-08 NOTE — Progress Notes (Deleted)
Office Visit Note   Patient: [name]           Date of Birth: Jan 14, 1956           MRN: [id number] Visit Date:               Requested by: Antony Contras, MD [address],  [place] [address] PCP: Antony Contras, MD   Assessment & Plan: Visit Diagnoses:  1. Right shoulder pain, unspecified chronicity     Plan: ***  Follow-Up Instructions: Return With Dr. Marlou Sa.   Orders:  No orders of the defined types were placed in this encounter.  Meds ordered this encounter  Medications  . methylPREDNISolone (MEDROL DOSEPAK) 4 MG TBPK tablet    Sig: Take as directed with food    Dispense:  21 tablet    Refill:  0  . HYDROcodone-acetaminophen (NORCO/VICODIN) 5-325 MG tablet    Sig: Take 1 tablet by mouth every 4 (four) hours as needed for moderate pain.    Dispense:  20 tablet    Refill:  0      Procedures: No procedures performed   Clinical Data: No additional findings.   Subjective: Chief Complaint  Patient presents with  . Right Shoulder - Pain    HPI  Review of Systems   Objective: Vital Signs: There were no vitals taken for this visit.  Physical Exam  Ortho Exam  Specialty Comments:  No specialty comments available.  Imaging: No results found.   PMFS History: Patient Active Problem List   Diagnosis Date Noted  . Poorly controlled persistent asthma 02/27/2022  . Pain in right shoulder 02/09/2022  . Asthma exacerbation 09/19/2020  . Anemia 10/13/2019  . Glaucoma 10/13/2019  . Hypertensive disorder 10/13/2019  . Uterine leiomyoma 10/13/2019  . Vitamin D insufficiency 04/26/2019  . TIA (transient ischemic attack) 03/30/2019  . Hyperlipidemia 03/30/2019  . Thyroid nodule 03/30/2019  . Stroke-like episode s/p IV tPA 03/28/2019  . GERD (gastroesophageal reflux disease) 06/19/2018  . Constipation 06/19/2018  . Other fatigue 08/02/2017  . Shortness of breath on exertion 08/02/2017  . Mass of axilla 07/07/2017  .  Abdominal pain, epigastric   . Abnormal CT of the abdomen   . Pancreatitis, acute   . Acute pancreatitis 08/13/2015  . UTI (lower urinary tract infection) 08/13/2015  . Severe obesity (BMI >= 40) ([place]) 01/23/2015  . Sinusitis, chronic 01/08/2015  . Cough variant asthma 01/03/2015  . Arthritis of right hip 12/12/2013  . 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 Benign neoplasm of colon 08/22/2013  . Special screening for malignant neoplasms, colon 08/22/2013  . Abdominal pain 08/19/2013  . Diabetes mellitus without complication ([place]) 70/26/3785  . Essential hypertension, benign 08/19/2013  . Ventral hernia 08/19/2013   Past Medical History:  Diagnosis Date  . Anemia 2006   required transfusion post TAH/BSO 05/2005  . Arthritis   . Asthma   . Chronic cough   . Colon polyps    hyperplastic  . Diabetes mellitus without complication ([place])   . Diverticulosis   . GERD (gastroesophageal reflux disease)    on prilosec  . GLA deficiency ([place])   . Glaucoma of both eyes   . Hypertension   . Joint pain   . Osteoarthritis   . Pancreatitis   . Shortness of breath dyspnea   . Vitamin D deficiency     Family History  Problem Relation Age of Onset  . Emphysema Mother  smoked  . Diabetes Mother   . Hypertension Mother   . Colon cancer Father        7-s  . Hypertension Father   . Heart disease Father   . High Cholesterol Father   . Breast cancer Sister     Past Surgical History:  Procedure Laterality Date  . ABDOMINAL HYSTERECTOMY    . BREAST EXCISIONAL BIOPSY Right 1990  . COLONOSCOPY N/A 08/22/2013   Procedure: COLONOSCOPY;  Surgeon: Irene Shipper, MD;  Location[facility] ENDOSCOPY;  Service: Endoscopy;  Laterality: N/A;  . ESOPHAGOGASTRODUODENOSCOPY N/A 08/22/2013   Procedure: ESOPHAGOGASTRODUODENOSCOPY (EGD);  Surgeon: Irene Shipper, MD;  Location: [facility] ENDOSCOPY;  Service: Endoscopy;  Laterality: N/A;  . ESOPHAGOGASTRODUODENOSCOPY (EGD) WITH PROPOFOL N/A 08/19/2015    Procedure: ESOPHAGOGASTRODUODENOSCOPY (EGD) WITH PROPOFOL;  Surgeon: Irene Shipper, MD;  Location: [facility] END[facility]COPY;  Service: Endoscopy;  Laterality: N/A;  . HERNIA REPAIR    . HYSTEROSCOPY WITH D & C  01/2005   for uterine fibroids.   . INSERTION OF MESH N/A 08/24/2013   Procedure: INSERTION OF MESH;  Surgeon: Edward Jolly, MD;  Location: [place];  Service: General;  Laterality: N/A;  . JOINT REPLACEMENT Right   . LIPOMA EXCISION Left 06/21/2015   Procedure: EXCISION OF LEFT SCALP LIPOMA;  Surgeon: Johnathan Hausen, MD;  Location: [place];  Service: General;  Laterality: Left;  . PANNICULECTOMY N/A 08/24/2013   Procedure: PANNICULECTOMY;  Surgeon: Edward Jolly, MD;  Location: [place];  Service: General;  Laterality: N/A;  . TOTAL ABDOMINAL HYSTERECTOMY W/ BILATERAL SALPINGOOPHORECTOMY  05/2005  . TOTAL HIP ARTHROPLASTY Right 12/12/2013   Procedure: RIGHT TOTAL HIP ARTHROPLASTY ANTERIOR APPROACH;  Surgeon: Mcarthur Rossetti, MD;  Location: [place];  Service: Orthopedics;  Laterality: Right;  . VENTRAL HERNIA REPAIR N/A 08/24/2013   Procedure: HERNIA REPAIR VENTRAL ADULT;  Surgeon: Edward Jolly, MD;  Location: [facility];  Service: General;  Laterality: N/A;   Social History   Occupational History  . Occupation: Education officer, museum  Tobacco Use  . Smoking status: Former    Packs/day: 0.25    Years: 15.00    Total pack years: 3.75    Types: Cigarettes    Quit date: 06/22/1978    Years since quitting: 43.9  . Smokeless tobacco: Never  Vaping Use  . Vaping Use: Never used  Substance and Sexual Activity  . Alcohol use: No    Alcohol/week: 0.0 standard drinks of alcohol  . Drug use: No  . Sexual activity: Not on file

## 2022-06-08 NOTE — Progress Notes (Signed)
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  She is a patient of [name].  She has had both subacromial and intra-articular ultrasound-guided injections into her right shoulder and she is only gotten temporary relief.  At the time she saw [name] 2 months ago for her intra-articular injection she was not interested in any shoulder surgery.  She has tried to treat this conservatively but now is having increased pain to the point where she cannot really use her arm and she is awakening at night with pain  Assessment & Plan: Visit Diagnoses:  1. Right shoulder pain, unspecified chronicity     Plan: MRI that was reviewed by [name] did demonstrate retracted tears of the supraspinatus and infraspinatus.  This is now been a quality-of-life issue for her.  She does not think the injections have really helped.  I will refer her back to [name].  In the meantime I will try a Medrol Dosepak.  She has had this before when she her asthma has flared up and she tolerates it well.  She knows not to take other anti-inflammatories with this.  I am cautious about giving her another injection at the time even subacromial which I could do today but if she is anticipating shoulder surgery in the next couple months I think the oral steroids would be safer.  Will also give her a few Norco just to get her through the holidays.  She agrees to only take these when she absolutely needs them  Follow-Up Instructions: Return With [name].   Ortho Exam  Patient is alert, oriented, no adenopathy, well-dressed, normal  affect, normal respiratory effort. Examination of her right shoulder she is able to hold her arm above her head though painful.  She cannot internally rotate behind her back because of pain.  She has limited external rotation which is quite stiff.  Also quite painful.  She is neurovascularly intact has no neck pain  Imaging: No results found. No images are attached to the encounter.  Labs: Lab Results  Component Value Date   HGBA1C 7.7 (H) 03/29/2019   HGBA1C 6.8 (H) 08/02/2017   REPTSTATUS 04/26/2022 FINAL 04/24/2022   GRAMSTAIN  09/11/2007    NO WBC SEEN NO SQUAMOUS EPITHELIAL CELLS SEEN NO ORGANISMS SEEN   CULT MULTIPLE SPECIES PRESENT, SUGGEST RECOLLECTION (A) 04/24/2022   LABORGA KLEBSIELLA PNEUMONIAE 08/13/2015     Lab Results  Component Value Date   ALBUMIN 4.2 04/24/2022   ALBUMIN 3.9 03/28/2019   ALBUMIN 4.0 06/19/2018    Lab Results  Component Value Date   MG 1.7 08/14/2015   Lab Results  Component Value Date   VD25OH 25.7 (L) 04/24/2019   VD25OH 33.1 01/06/2018   VD25OH 17.9 (L) 08/02/2017    No results found for: "PREALBUMIN"    Latest Ref Rng & Units 04/24/2022   11:11 AM 03/15/2021    3:39 PM 10/30/2020  3:56 PM  CBC EXTENDED  WBC 4.0 - 10.5 K/uL 5.6  7.0  4.7   RBC 3.87 - 5.11 MIL/uL 4.50  4.06  4.18   Hemoglobin 12.0 - 15.0 g/dL 13.3  12.1  12.5   HCT 36.0 - 46.0 % 42.3  38.1  37.4   Platelets 150 - 400 K/uL 279  269  266.0   NEUT# 1.7 - 7.7 K/uL  6.2  1.9   Lymph# 0.7 - 4.0 K/uL  0.4  1.8      There is no height or weight on file to calculate BMI.  Orders:  No orders of the defined types were placed in this encounter.  Meds ordered this encounter  Medications   methylPREDNISolone (MEDROL DOSEPAK) 4 MG TBPK tablet    Sig: Take as directed with food    Dispense:  21 tablet    Refill:  0   HYDROcodone-acetaminophen (NORCO/VICODIN) 5-325 MG tablet    Sig: Take 1 tablet by mouth every 4 (four) hours as needed for moderate pain.     Dispense:  20 tablet    Refill:  0     Procedures: No procedures performed  Clinical Data: No additional findings.  ROS:  All other systems negative, except as noted in the HPI. Review of Systems  Objective: Vital Signs: There were no vitals taken for this visit.  Specialty Comments:  No specialty comments available.  PMFS History: Patient Active Problem List   Diagnosis Date Noted   Poorly controlled persistent asthma 02/27/2022   Pain in right shoulder 02/09/2022   Asthma exacerbation 09/19/2020   Anemia 10/13/2019   Glaucoma 10/13/2019   Hypertensive disorder 10/13/2019   Uterine leiomyoma 10/13/2019   Vitamin D insufficiency 04/26/2019   TIA (transient ischemic attack) 03/30/2019   Hyperlipidemia 03/30/2019   Thyroid nodule 03/30/2019   Stroke-like episode s/p IV tPA 03/28/2019   GERD (gastroesophageal reflux disease) 06/19/2018   Constipation 06/19/2018   Other fatigue 08/02/2017   Shortness of breath on exertion 08/02/2017   Mass of axilla 07/07/2017   Abdominal pain, epigastric    Abnormal CT of the abdomen    Pancreatitis, acute    Acute pancreatitis 08/13/2015   UTI (lower urinary tract infection) 08/13/2015   Severe obesity (BMI >= 40) ([facility]) 01/23/2015   Sinusitis, chronic 01/08/2015   Cough variant asthma 01/03/2015   Arthritis of right hip 12/12/2013   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   Benign neoplasm of colon 08/22/2013   Special screening for malignant neoplasms, colon 08/22/2013   Abdominal pain 08/19/2013   Diabetes mellitus without complication ([facility]) 44/81/8563   Essential hypertension, benign 08/19/2013   Ventral hernia 08/19/2013   Past Medical History:  Diagnosis Date   Anemia 2006   required transfusion post TAH/BSO 05/2005   Arthritis    Asthma    Chronic cough    Colon polyps    hyperplastic   Diabetes mellitus without complication (HCC)    Diverticulosis    GERD (gastroesophageal reflux disease)    on  prilosec   GLA deficiency (HCC)    Glaucoma of both eyes    Hypertension    Joint pain    Osteoarthritis    Pancreatitis    Shortness of breath dyspnea    Vitamin D deficiency     Family History  Problem Relation Age of Onset   Emphysema Mother        smoked   Diabetes Mother    Hypertension Mother  Colon cancer Father        7-s   Hypertension Father    Heart disease Father    High Cholesterol Father    Breast cancer Sister     Past Surgical History:  Procedure Laterality Date   ABDOMINAL HYSTERECTOMY     BREAST EXCISIONAL BIOPSY Right 1990   COLONOSCOPY N/A 08/22/2013   Procedure: COLONOSCOPY;  Surgeon: Irene Shipper, [name];  Location: [facility];  Service: Endoscopy;  Laterality: N/A;   ESOPHAGOGASTRODUODENOSCOPY N/A 08/22/2013   Procedure: ESOPHAGOGASTRODUODENOSCOPY (EGD);  Surgeon: Irene Shipper, [name];  Location[facility]COPY;  Service: Endoscopy;  Laterality: N/A;   ESOPHAGOGASTRODUODENOSCOPY (EGD) WITH PROPOFOL N/A 08/19/2015   Procedure: ESOPHAGOGASTRODUODENOSCOPY (EGD) WITH PROPOFOL;  Surgeon: Irene Shipper, [name];  Location: [facility] ENDOSCOPY;  Service: Endoscopy;  Laterality: N/A;   HERNIA REPAIR     HYSTEROSCOPY WITH D & C  01/2005   for uterine fibroids.    INSERTION OF MESH N/A 08/24/2013   Procedure: INSERTION OF MESH;  Surgeon: Edward Jolly, [name];  Location: [facility];  Service: General;  Laterality: N/A;   JOINT REPLACEMENT Right    LIPOMA EXCISION Left 06/21/2015   Procedure: EXCISION OF LEFT SCALP LIPOMA;  Surgeon: Johnathan Hausen, [name];  Location: [facility];  Service: General;  Laterality: Left;   PANNICULECTOMY N/A 08/24/2013   Procedure: PANNICULECTOMY;  Surgeon: Edward Jolly, [name];  Location: [facility];  Service: General;  Laterality: N/A;   TOTAL ABDOMINAL HYSTERECTOMY W/ BILATERAL SALPINGOOPHORECTOMY  05/2005   TOTAL HIP ARTHROPLASTY Right 12/12/2013   Procedure: RIGHT TOTAL HIP ARTHROPLASTY ANTERIOR APPROACH;  Surgeon: Mcarthur Rossetti, [name];   Location: [facility];  Service: Orthopedics;  Laterality: Right;   VENTRAL HERNIA REPAIR N/A 08/24/2013   Procedure: HERNIA REPAIR VENTRAL ADULT;  Surgeon: Edward Jolly, [name];  Location: [facility];  Service: General;  Laterality: N/A;   Social History   Occupational History   Occupation: Education officer, museum  Tobacco Use   Smoking status: Former    Packs/day: 0.25    Years: 15.00    Total pack years: 3.75    Types: Cigarettes    Quit date: 06/22/1978    Years since quitting: 43.9   Smokeless tobacco: Never  Vaping Use   Vaping Use: Never used  Substance and Sexual Activity   Alcohol use: No    Alcohol/week: 0.0 standard drinks of alcohol   Drug use: No   Sexual activity: Not on file

## 2022-06-09 ENCOUNTER — Telehealth: Payer: Self-pay

## 2022-06-09 NOTE — Telephone Encounter (Signed)
IC and appt made as work in for patient to see Dr Marlou Sa per [facility]'s request

## 2022-06-10 ENCOUNTER — Ambulatory Visit: Payer: BC Managed Care – PPO | Admitting: Orthopedic Surgery

## 2022-06-18 ENCOUNTER — Other Ambulatory Visit: Payer: Self-pay | Admitting: Pulmonary Disease

## 2022-06-18 DIAGNOSIS — J4530 Mild persistent asthma, uncomplicated: Secondary | ICD-10-CM

## 2022-07-06 ENCOUNTER — Ambulatory Visit: Payer: BC Managed Care – PPO | Admitting: Orthopedic Surgery

## 2022-07-06 ENCOUNTER — Encounter: Payer: Self-pay | Admitting: Orthopedic Surgery

## 2022-07-06 DIAGNOSIS — M25511 Pain in right shoulder: Secondary | ICD-10-CM | POA: Diagnosis not present

## 2022-07-06 NOTE — Progress Notes (Signed)
Office Visit Note   Patient: [name]           Date of Birth: [birth year]           MRN: [id number] Visit Date: 07/06/2022 Requested by: Antony Contras, MD [address] [place],  [place] [id number] PCP: Antony Contras, MD  Subjective: Chief Complaint  Patient presents with   Right Shoulder - Follow-up    HPI: [name] is a 67 y.o. female who presents to the office reporting right shoulder pain.  She states that she injured her shoulder while working.  Initial fall occurred in November 2022.  Reported some shoulder pain at that time.  Had a subsequent fall at work in August 2023 and subsequent workup there demonstrated that her injury occurred earlier which caused rotator cuff pathology to progress.  She describes both weakness and pain.  She describes popping.  She has a husband son and 4 grandkids at home.  No personal or family history of DVT or pulmonary embolism no cardiac history.  She does have diabetes.  She has had an injection into the shoulder which helped her for 4 weeks.  She works as a Education officer, museum.  In general pain is a bigger problem than weakness.  She also describes having a lipoma both in the anterior aspect of the shoulder as well as posterior aspect of the shoulder near the posterior axillary crease.  MRI scan is reviewed.  Shows infraspinatus and supraspinatus tearing with retraction to the glenoid rim with atrophy.  Also has some subscapularis tearing as well as complete tear of the intra-articular portion of the long head of the biceps but clinically she does not describe a Popeye deformity..                ROS: All systems reviewed are negative as they relate to the chief complaint within the history of present illness.  Patient denies fevers or chills.  Assessment & Plan: Visit Diagnoses:  1. Right shoulder pain, unspecified chronicity     Plan: Impression is right shoulder pain from rotator cuff arthropathy.  Patient has pain as well as  arthropathy and some weakness.  She also has a lipoma in the front anteriorly and posteriorly in the shoulder.  We discussed today the risk and benefits of reverse shoulder replacement.  They include but not limited to infection or vessel damage incomplete resolution of pain as well as incomplete functional restoration.  Patient understands but wishes to proceed.  She will also like to have both lipomas excised at the time of surgery.  The anterior lipoma should not be a problem.  On that lipoma that is closer to her posterior axilla we would potentially need to use an arm holder for that or put her in the lateral position after her surgery.  Had something we can better assess once we have her in the [place].  Patient understands the risk and benefits as well as the expected rehabilitative time course.  All questions answered  Follow-Up Instructions: No follow-ups on file.   Orders:  Orders Placed This Encounter  Procedures   CT SHOULDER RIGHT WO CONTRAST   No orders of the defined types were placed in this encounter.     Procedures: No procedures performed   Clinical Data: No additional findings.  Objective: Vital Signs: There were no vitals taken for this visit.  Physical Exam:  Constitutional: Patient appears well-developed HEENT:  Head: Normocephalic Eyes:EOM are normal Neck:  Normal range of motion Cardiovascular: Normal rate Pulmonary/chest: Effort normal Neurologic: Patient is alert Skin: Skin is warm Psychiatric: Patient has normal mood and affect  Ortho Exam: Ortho exam demonstrates forward flexion and abduction both above 90 degrees.  She does have weakness at 5- out of 5 to subscap testing and 4+ out of 5 to external rotation strength testing.  Coarseness and popping is present but no Popeye deformity is noted in the proximal humeral region.  Deltoid fires.  Motor or sensory function to the hand is intact.  Cervical spine range of motion is intact.  Specialty  Comments:  No specialty comments available.  Imaging: No results found.   PMFS History: Patient Active Problem List   Diagnosis Date Noted   Poorly controlled persistent asthma 02/27/2022   Pain in right shoulder 02/09/2022   Asthma exacerbation 09/19/2020   Anemia 10/13/2019   Glaucoma 10/13/2019   Hypertensive disorder 10/13/2019   Uterine leiomyoma 10/13/2019   Vitamin D insufficiency 04/26/2019   TIA (transient ischemic attack) 03/30/2019   Hyperlipidemia 03/30/2019   Thyroid nodule 03/30/2019   Stroke-like episode s/p IV tPA 03/28/2019   GERD (gastroesophageal reflux disease) 06/19/2018   Constipation 06/19/2018   Other fatigue 08/02/2017   Shortness of breath on exertion 08/02/2017   Mass of axilla 07/07/2017   Abdominal pain, epigastric    Abnormal CT of the abdomen    Pancreatitis, acute    Acute pancreatitis 08/13/2015   UTI (lower urinary tract infection) 08/13/2015   Severe obesity (BMI >= 40) ([place]) 01/23/2015   Sinusitis, chronic 01/08/2015   Cough variant asthma 01/03/2015   Arthritis of right hip 12/12/2013   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   Benign neoplasm of colon 08/22/2013   Special screening for malignant neoplasms, colon 08/22/2013   Abdominal pain 08/19/2013   Diabetes mellitus without complication ([place] [facility]) 19/50/9326   Essential hypertension, benign 08/19/2013   Ventral hernia 08/19/2013   Past Medical History:  Diagnosis Date   Anemia 2006   required transfusion post TAH/BSO 05/2005   Arthritis    Asthma    Chronic cough    Colon polyps    hyperplastic   Diabetes mellitus without complication (HCC)    Diverticulosis    GERD (gastroesophageal reflux disease)    on prilosec   GLA deficiency (HCC)    Glaucoma of both eyes    Hypertension    Joint pain    Osteoarthritis    Pancreatitis    Shortness of breath dyspnea    Vitamin D deficiency     Family History  Problem Relation Age of Onset   Emphysema Mother         smoked   Diabetes Mother    Hypertension Mother    Colon cancer Father        7-s   Hypertension Father    Heart disease Father    High Cholesterol Father    Breast cancer Sister     Past Surgical History:  Procedure Laterality Date   ABDOMINAL HYSTERECTOMY     BREAST EXCISIONAL BIOPSY Right 1990   COLONOSCOPY N/A 08/22/2013   Procedure: COLONOSCOPY;  Surgeon: Irene Shipper, MD;  Location: [facility];  Service: Endoscopy;  Laterality: N/A;   ESOPHAGOGASTRODUODENOSCOPY N/A 08/22/2013   Procedure: ESOPHAGOGASTRODUODENOSCOPY (EGD);  Surgeon: Irene Shipper, MD;  Location: [facility] ENDOSCOPY;  Service: Endoscopy;  Laterality: N/A;   ESOPHAGOGASTRODUODENOSCOPY (EGD) WITH PROPOFOL N/A 08/19/2015   Procedure: ESOPHAGOGASTRODUODENOSCOPY (EGD) WITH PROPOFOL;  Surgeon: Jenny Reichmann  Delice Lesch, MD;  Location: [facility]OS[facility];  Service: Endoscopy;  Laterality: N/A;   HERNIA REPAIR     HYSTEROSCOPY WITH D & C  01/2005   for uterine fibroids.    INSERTION OF MESH N/A 08/24/2013   Procedure: INSERTION OF MESH;  Surgeon: Edward Jolly, MD;  Location: [facility];  Service: General;  Laterality: N/A;   JOINT REPLACEMENT Right    LIPOMA EXCISION Left 06/21/2015   Procedure: EXCISION OF LEFT SCALP LIPOMA;  Surgeon: Johnathan Hausen, MD;  Location: [facility];  Service: General;  Laterality: Left;   PANNICULECTOMY N/A 08/24/2013   Procedure: PANNICULECTOMY;  Surgeon: Edward Jolly, MD;  Location: [facility];  Service: General;  Laterality: N/A;   TOTAL ABDOMINAL HYSTERECTOMY W/ BILATERAL SALPINGOOPHORECTOMY  05/2005   TOTAL HIP ARTHROPLASTY Right 12/12/2013   Procedure: RIGHT TOTAL HIP ARTHROPLASTY ANTERIOR APPROACH;  Surgeon: Mcarthur Rossetti, MD;  Location: [facility];  Service: Orthopedics;  Laterality: Right;   VENTRAL HERNIA REPAIR N/A 08/24/2013   Procedure: HERNIA REPAIR VENTRAL ADULT;  Surgeon: Edward Jolly, MD;  Location: [facility];  Service: General;  Laterality: N/A;   Social History   Occupational  History   Occupation: Education officer, museum  Tobacco Use   Smoking status: Former    Packs/day: 0.25    Years: 15.00    Total pack years: 3.75    Types: Cigarettes    Quit date: 06/22/1978    Years since quitting: 44.0   Smokeless tobacco: Never  Vaping Use   Vaping Use: Never used  Substance and Sexual Activity   Alcohol use: No    Alcohol/week: 0.0 standard drinks of alcohol   Drug use: No   Sexual activity: Not on file

## 2022-07-20 ENCOUNTER — Telehealth: Payer: Self-pay

## 2022-07-20 NOTE — Telephone Encounter (Signed)
Received faxed PA renewal form from [facility]. Per previous encounter, pt's current PA for [facility] will expire on 08/19/22. Pt canceled appt on 03/25/22 however she did not reschedule at that time, she has not been since since she switched from [facility] to [facility] and therefore clinical effectiveness of new treatment cannot be determined.  Will route to front desk team for assistance getting pt scheduled for f/u.

## 2022-07-20 NOTE — Telephone Encounter (Signed)
Left voicemail for patient to call back to schedule office visit.  ?

## 2022-07-29 NOTE — Telephone Encounter (Signed)
PA for [facility] will be submitted after OV is note signed and completed from 08/26/2022  Knox Saliva, PharmD, MPH, BCPS, CPP Clinical Pharmacist (Rheumatology and Pulmonology)

## 2022-07-31 ENCOUNTER — Other Ambulatory Visit (HOSPITAL_COMMUNITY): Payer: Self-pay

## 2022-07-31 ENCOUNTER — Encounter: Payer: Self-pay | Admitting: Orthopedic Surgery

## 2022-08-03 ENCOUNTER — Ambulatory Visit
Admission: RE | Admit: 2022-08-03 | Discharge: 2022-08-03 | Disposition: A | Discharge status: Home / Self Care | Payer: BC Managed Care – PPO | Source: Ambulatory Visit | Attending: Orthopedic Surgery | Admitting: Orthopedic Surgery

## 2022-08-03 DIAGNOSIS — M25511 Pain in right shoulder: Secondary | ICD-10-CM

## 2022-08-04 NOTE — Progress Notes (Signed)
Blue sheet done thx

## 2022-08-26 ENCOUNTER — Ambulatory Visit (INDEPENDENT_AMBULATORY_CARE_PROVIDER_SITE_OTHER): Payer: BC Managed Care – PPO | Admitting: Pulmonary Disease

## 2022-08-26 ENCOUNTER — Encounter: Payer: Self-pay | Admitting: Pulmonary Disease

## 2022-08-26 VITALS — BP 126/70 | HR 61 | Wt 167.0 lb

## 2022-08-26 DIAGNOSIS — J45998 Other asthma: Secondary | ICD-10-CM | POA: Diagnosis not present

## 2022-08-26 NOTE — Progress Notes (Signed)
$'@Patient'Q$  ID: Mervin Kung, female    DOB: May 30, 1956, 67 y.o.   MRN: [id number]  Chief Complaint  Patient presents with   Follow-up    Follow up for wheezing and SOB. Pt states that she is not having to use her Symbicort that much anymore. She states that the Cheron Every is working well. Pt states no flaire up with her asthma or anything in a few months. News a New Rx of Tezspire sent in please    Referring provider: Antony Contras, MD  HPI:   67 y.o. woman who previously followed in pulmonary clinic with Dr. Melvyn Novas with asthma here for follow up of DOE felt to be poorly controlled asthma exacerbated by COVID infection.    Last visit concern for ongoing for controlled asthma.  Multiple exacerbations.  Previously on Xolair.  Switch to test prior.  Started 05/2022.  Since then much improved symptoms.  No cough.  Shortness of breath fine.  Rare rescue inhaler use.  HPI at initial visit: Patient has been diagnosed with asthma for years.  Usually relatively well controlled.  Uses Symbicort mid dose.  About once or twice a years with changes states since, spring and fall she has worsening breathing.  This occurred earlier in the month.  All of a sudden she had significant chest tightness, wheeze, shortness of breath.  Associated with hacking cough.  Dry.  Went to urgent care and received a dose of IV steroids.  Her symptoms have improved a good deal but still has lingering cough and mild chest tightness, shortness of breath.  No fever or chills.  No sick contacts.  Denies any viral symptoms such as sore throat, runny nose, fever at time of onset of symptoms.  When asked, she does think maybe she had a little more nasal congestion on the days preceding this acute change.  Chest x-ray 03/04/2020 reviewed and interpreted as clear lungs with mild hyperinflation with increased AP diameter on lateral film.  Prior labs reviewed which are notable for elevated IgE greater than 1200 and eosinophils as high as 800  in the past.  PMH: Asthma, diabetes, history of pancreatitis Surgical history: Hysterectomy Family history: Reviewed, denies significant family history of respiratory illness Social history: Former smoker, 4-pack-year smoking history quit 40 years ago, grew up in [place], Education officer, museum to [employer], raising 4 grandchildren all girls   Questionaires / Pulmonary Flowsheets:   ACT:  Asthma Control Test ACT Total Score  01/21/2022  9:09 AM 16  09/18/2020  4:09 PM 11    MMRC:     No data to display           Epworth:      No data to display           Tests:   FENO:  No results found for: "NITRICOXIDE"  PFT:     No data to display           WALK:      No data to display           Imaging: Personally reviewed, per EMR and discussion in this note  Lab Results: Personally reviewed, eosinophils elevated up to 800 in the past, IgE greater than 1200 CBC    Component Value Date/Time   WBC 5.6 04/24/2022 1111   RBC 4.50 04/24/2022 1111   HGB 13.3 04/24/2022 1111   HGB 13.4 08/02/2017 1035   HCT 42.3 04/24/2022 1111   HCT 40.7 08/02/2017 1035   PLT 279  04/24/2022 1111   MCV 94.0 04/24/2022 1111   MCV 86 08/02/2017 1035   MCH 29.6 04/24/2022 1111   MCHC 31.4 04/24/2022 1111   RDW 14.1 04/24/2022 1111   RDW 14.9 08/02/2017 1035   LYMPHSABS 0.4 (L) 03/15/2021 1539   LYMPHSABS 2.2 08/02/2017 1035   MONOABS 0.3 03/15/2021 1539   EOSABS 0.0 03/15/2021 1539   EOSABS 0.2 08/02/2017 1035   BASOSABS 0.0 03/15/2021 1539   BASOSABS 0.0 08/02/2017 1035    BMET    Component Value Date/Time   NA 142 04/24/2022 1111   NA 141 08/02/2017 1035   K 4.2 04/24/2022 1111   CL 106 04/24/2022 1111   CO2 28 04/24/2022 1111   GLUCOSE 113 (H) 04/24/2022 1111   BUN 17 04/24/2022 1111   BUN 14 08/02/2017 1035   CREATININE 0.96 04/24/2022 1111   CALCIUM 9.6 04/24/2022 1111   GFRNONAA >60 04/24/2022 1111   GFRAA >60 03/28/2019 1326    BNP No results  found for: "BNP"  ProBNP No results found for: "PROBNP"  Specialty Problems       Pulmonary Problems   Cough variant asthma    Followed in Pulmonary clinic/ [facility]/ [facility]   - allergy profile 01/03/2015 > 0.8, IgE 1256 cat > dog  Dust/ mold - sinus ct > Pos > see sinusitis - 01/17/2015 p extensive coaching HFA effectiveness =   75% > continue symbicort 80 2bid       Sinusitis, chronic    Sinus CT 01/08/2015  Multifocal paranasal sinus disease, most pronounced in the left ethmoid air cell complex and left sphenoid sinus region. Bilateral ostiomeatal unit obstruction. 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 No air-fluid levels.> referred to ENT 01/17/2015 >>>       Shortness of breath on exertion   Asthma exacerbation   Poorly controlled persistent asthma    Allergies  Allergen Reactions   Naproxen Hives    "hives, I had to call 911"   Diclofenac Other (See Comments)    Hives   Penicillins Hives and Other (See Comments)    Has patient had a PCN reaction causing immediate rash, facial/tongue/throat swelling, SOB or lightheadedness with hypotension: Yes  Has patient had a PCN reaction causing severe rash involving mucus membranes or skin necrosis: Yes  Has patient had a PCN reaction that required hospitalization No  Has patient had a PCN reaction occurring within the last 10 years: No.  If all of the above answers are "NO", then may proceed with Cephalosporin use.    Immunization History  Administered Date(s) Administered   Influenza Split 03/22/2014   Influenza,inj,Quad PF,6+ Mos 04/19/2017, 05/08/2018, 03/18/2019   Influenza,inj,quad, With Preservative 04/23/2019, 03/22/2020   PFIZER(Purple Top)SARS-COV-2 Vaccination 08/19/2019, 09/09/2019   Tdap 05/12/2021    Past Medical History:  Diagnosis Date   Anemia 2006   required transfusion post TAH/BSO 05/2005   Arthritis    Asthma    Chronic cough    Colon polyps    hyperplastic    Diabetes mellitus without complication (HCC)    Diverticulosis    GERD (gastroesophageal reflux disease)    on prilosec   GLA deficiency (HCC)    Glaucoma of both eyes    Hypertension    Joint pain    Osteoarthritis    Pancreatitis    Shortness of breath dyspnea    Vitamin D deficiency     Tobacco History: Social History   Tobacco Use  Smoking Status Former  Packs/day: 0.25   Years: 15.00   Total pack years: 3.75   Types: Cigarettes   Quit date: 06/22/1978   Years since quitting: 44.2  Smokeless Tobacco Never   Counseling given: Not Answered   Continue to not smoke  Outpatient Encounter Medications as of 08/26/2022  Medication Sig   albuterol (PROVENTIL) (2.5 MG/3ML) 0.083% nebulizer solution Take 3 mLs (2.5 mg total) by nebulization every 6 (six) hours as needed for wheezing or shortness of breath.   aspirin EC 81 MG EC tablet Take 1 tablet (81 mg total) by mouth daily.   atorvastatin (LIPITOR) 20 MG tablet Take 1 tablet (20 mg total) by mouth daily at 6 PM.   budesonide-formoterol (SYMBICORT) 160-4.5 MCG/ACT inhaler INHALE 2 PUFFS INTO THE LUNGS IN THE MORNING AND AT BEDTIME   cetirizine (ZYRTEC ALLERGY) 10 MG tablet Take 1 tablet (10 mg total) by mouth daily.   chlorpheniramine (CHLOR-TRIMETON) 4 MG tablet Take 1 tablet (4 mg total) by mouth every 6 (six) hours as needed for allergies.   fluticasone (FLONASE) 50 MCG/ACT nasal spray Place 1 spray into both nostrils daily.   glucose blood test strip Use as instructed   HYDROcodone-acetaminophen (NORCO/VICODIN) 5-325 MG tablet Take 1 tablet by mouth every 4 (four) hours as needed for moderate pain.   MELATONIN PO Take 1 tablet by mouth at bedtime.   metFORMIN (GLUCOPHAGE) 1000 MG tablet Take 1,000 mg by mouth 2 (two) times daily with a meal.   methylPREDNISolone (MEDROL DOSEPAK) 4 MG TBPK tablet Take as directed with food   montelukast (SINGULAIR) 10 MG tablet TAKE 1 TABLET(10 MG) BY MOUTH AT BEDTIME   omeprazole  (PRILOSEC) 20 MG capsule Take 1 capsule (20 mg total) by mouth in the morning and at bedtime.   pioglitazone (ACTOS) 15 MG tablet Take 15 mg by mouth daily.   Tezepelumab-ekko (TEZSPIRE) 210 MG/1.91ML SOAJ Inject 210 mg into the skin every 28 (twenty-eight) days.   tiZANidine (ZANAFLEX) 4 MG tablet Take 1 tablet (4 mg total) by mouth 3 (three) times daily.   Travoprost, BAK Free, (TRAVATAN) 0.004 % SOLN ophthalmic solution    EPINEPHrine 0.3 mg/0.3 mL IJ SOAJ injection Inject 0.3 mg into the muscle as needed for anaphylaxis.   No facility-administered encounter medications on file as of 08/26/2022.     Review of Systems  Review of Systems  n/a Physical Exam  BP 126/70 (BP Location: Left Arm, Patient Position: Sitting, Cuff Size: Normal)   Pulse 61   Wt 167 lb (75.8 kg)   SpO2 99%   BMI 30.54 kg/m   Wt Readings from Last 5 Encounters:  08/26/22 167 lb (75.8 kg)  04/24/22 174 lb 2.6 oz (79 kg)  02/27/22 174 lb 12.8 oz (79.3 kg)  01/21/22 175 lb 3.2 oz (79.5 kg)  01/02/22 175 lb 9.6 oz (79.7 kg)    BMI Readings from Last 5 Encounters:  08/26/22 30.54 kg/m  04/24/22 31.85 kg/m  02/27/22 31.97 kg/m  01/21/22 32.04 kg/m  01/02/22 32.12 kg/m     Physical Exam General: Well-appearing, no acute distress Eyes: EOMI PERRLA Neck: Supple, no JVD Respiratory: Clear to auscultation bilaterally, normal work of breathing Cardiovascular: Regular rate and rhythm, no murmurs   Assessment & Plan:   Cough: Suspect related to poorly controlled asthma symptoms. Has responded to steroids.  Much improved with switch from Xolair to [employer].  Asthma: Elevated Eos and IgE in past. Recent exacerbation 09/19/2020 in setting of COVID 19 infection and now 2 subsequent  exacerbations in the interim. On high dose symbicort and singulair. Steroid course x 1 since last visit.  Eosinophils and IgE both elevated in the past.  More recently, he is on 400 and IgE 900.  Started Xolair in 2022.   Initially cough and dyspnea improving for several months.  However, starting on May 2023 worsening symptoms with 2 exacerbations requiring prednisone interval worsening wheezing cough.  Started Tezspire 05/2022.  Marked improvement in symptoms.  New prescription for Tezspire.    Return in about 6 months (around 02/26/2023).   Lanier Clam, MD 08/26/2022

## 2022-08-26 NOTE — Patient Instructions (Signed)
Nice to see you again  I am glad the [name] seems to be working  No changes to medication  Return to clinic in 6 months or sooner as needed with Dr. Silas Flood

## 2022-08-27 NOTE — Telephone Encounter (Signed)
Submitted a Prior Authorization request to  [facility]  for TEZSPIRE via CoverMyMeds. Will update once we receive a response.  Key: [id number]   Per automated response: Please reach out to the [facility] for further assistance [phone]  Completed and faxed form we received with clinicals to [facility] Fax: [phone] Phone: [phone] Case # [id number]  Knox Saliva, PharmD, MPH, BCPS, CPP Clinical Pharmacist (Rheumatology and Pulmonology)

## 2022-08-28 NOTE — Telephone Encounter (Signed)
Received notification from [facility] regarding a prior authorization for [place]. Authorization has been APPROVED from 08/27/2022 to 08/27/2023. Approval letter sent to scan center.  Authorization # [id number]

## 2022-09-15 ENCOUNTER — Inpatient Hospital Stay (HOSPITAL_COMMUNITY): Admission: RE | Admit: 2022-09-15 | Payer: BC Managed Care – PPO | Source: Ambulatory Visit

## 2022-09-24 DIAGNOSIS — Z01818 Encounter for other preprocedural examination: Secondary | ICD-10-CM

## 2022-10-09 ENCOUNTER — Encounter: Payer: BC Managed Care – PPO | Admitting: Surgical

## 2022-10-12 ENCOUNTER — Telehealth: Payer: Self-pay | Admitting: Pulmonary Disease

## 2022-10-12 MED ORDER — PREDNISONE 10 MG PO TABS: ORAL_TABLET | ORAL | 0 refills | Status: AC

## 2022-10-12 NOTE — Telephone Encounter (Signed)
Pt called the office stating that she has been having issues with wheezing for the past 5 days. Pt is requesting to have prednisone prescribed.  Pt has had to use her rescue inhaler at least twice daily and has done one neb treatment.  Pt said she gave herself an injection on 4/18 and since giving herself the injection is when this began. Pt said the injection was Tezspire. States the wheezing at first was not bad but then it did begin to get worse and is still going on.  Pharmacy that meds will need to be sent to is [facility] off [address]  Dr. Judeth Horn, please advise if prednisone can be prescribed for pt.

## 2022-10-12 NOTE — Telephone Encounter (Signed)
Prednisone 40 mg once daily x 5 days followed by 20 mg once daily x 5 days then stop.

## 2022-10-12 NOTE — Telephone Encounter (Signed)
Spoke with pt and reviewed medication and medication education. Pharmacy verified and prednisone order placed. Nothing further needed at this time.

## 2022-10-14 ENCOUNTER — Other Ambulatory Visit: Payer: Self-pay | Admitting: Pulmonary Disease

## 2022-10-14 DIAGNOSIS — J45998 Other asthma: Secondary | ICD-10-CM

## 2022-10-14 NOTE — Telephone Encounter (Signed)
Refill sent for TEZSPIRE to [facility]: [phone]  Dose: 210 mg every 4 weeks  Last OV: 08/26/22 Provider: Dr. Judeth Horn  Next OV: not scheduled but due 6 months  Chesley Mires, PharmD, MPH, BCPS Clinical Pharmacist (Rheumatology and Pulmonology)

## 2022-10-16 ENCOUNTER — Ambulatory Visit: Payer: BC Managed Care – PPO | Admitting: Orthopedic Surgery

## 2022-10-16 ENCOUNTER — Encounter: Payer: Self-pay | Admitting: Orthopedic Surgery

## 2022-10-16 DIAGNOSIS — M25511 Pain in right shoulder: Secondary | ICD-10-CM | POA: Diagnosis not present

## 2022-10-16 DIAGNOSIS — D1721 Benign lipomatous neoplasm of skin and subcutaneous tissue of right arm: Secondary | ICD-10-CM | POA: Diagnosis not present

## 2022-10-16 NOTE — Progress Notes (Signed)
Office Visit Note   Patient: [name]           Date of Birth: 05/22/1956           MRN: [id number] Visit Date: 10/16/2022 Requested by: Renee Joe, MD [phone] [name] [place],  [place] [address] PCP: Renee Joe, MD  Subjective: Chief Complaint  Patient presents with   Right Shoulder - Follow-up    HPI: [name] is a 67 y.o. female who presents to the office reporting right shoulder pain and right shoulder lipoma.  Scheduled for reverse shoulder replacement 11/19/2022.  Feels like that lipoma to her is getting bigger.  Her shoulder remains painful with range of motion attempts as well as with activities of daily living..                ROS: All systems reviewed are negative as they relate to the chief complaint within the history of present illness.  Patient denies fevers or chills.  Assessment & Plan: Visit Diagnoses:  1. Right shoulder pain, unspecified chronicity   2. Lipoma of right upper extremity     Plan: Impression is rotator cuff arthropathy right shoulder with arthritis as well.  She also has an axillary lipoma.  Will bit more in that posterior inferior glenohumeral joint region.  Measures about 4 x 5 cm and is freely mobile in that region of soft tissue.  Plan is MRI with IV contrast of that right axilla to evaluate the size of the lipoma and its mobility.  There is no surrounding lymphadenopathy.  We will still plan to perform the reverse replacement and remove that lipoma.  All questions answered Follow-Up Instructions: No follow-ups on file.   Orders:  Orders Placed This Encounter  Procedures   MR SHOULDER RIGHT W CONTRAST   No orders of the defined types were placed in this encounter.     Procedures: No procedures performed   Clinical Data: No additional findings.  Objective: Vital Signs: There were no vitals taken for this visit.  Physical Exam:  Constitutional: Patient appears well-developed HEENT:  Head:  Normocephalic Eyes:EOM are normal Neck: Normal range of motion Cardiovascular: Normal rate Pulmonary/chest: Effort normal Neurologic: Patient is alert Skin: Skin is warm Psychiatric: Patient has normal mood and affect  Ortho Exam: Ortho exam demonstrates weakness to infraspinatus supraspinatus and some degree of subscap testing on the right.  She still has forward flexion abduction just over 90 degrees.  Coarseness and grinding is present with passive range of motion of the shoulder.  Nontender 4 x 5-minute centimeter lipoma down around that quadrilateral space region.  No scapular winging present. Specialty Comments:  No specialty comments available.  Imaging: No results found.   PMFS History: Patient Active Problem List   Diagnosis Date Noted   Poorly controlled persistent asthma 02/27/2022   Pain in right shoulder 02/09/2022   Asthma exacerbation 09/19/2020   Anemia 10/13/2019   Glaucoma 10/13/2019   Hypertensive disorder 10/13/2019   Uterine leiomyoma 10/13/2019   Vitamin D insufficiency 04/26/2019   TIA (transient ischemic attack) 03/30/2019   Hyperlipidemia 03/30/2019   Thyroid nodule 03/30/2019   Stroke-like episode s/p IV tPA 03/28/2019   GERD (gastroesophageal reflux disease) 06/19/2018   Constipation 06/19/2018   Other fatigue 08/02/2017   Shortness of breath on exertion 08/02/2017   Mass of axilla 07/07/2017   Abdominal pain, epigastric    Abnormal CT of the abdomen    Pancreatitis, acute    Acute  pancreatitis 08/13/2015   UTI (lower urinary tract infection) 08/13/2015   Severe obesity (BMI >= 40) (HCC) 01/23/2015   Sinusitis, chronic 01/08/2015   Cough variant asthma 01/03/2015   Arthritis of right hip 12/12/2013   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   Benign neoplasm of colon 08/22/2013   Special screening for malignant neoplasms, colon 08/22/2013   Abdominal pain 08/19/2013   Diabetes mellitus without complication (HCC) 08/19/2013    Essential hypertension, benign 08/19/2013   Ventral hernia 08/19/2013   Past Medical History:  Diagnosis Date   Anemia 2006   required transfusion post TAH/BSO 05/2005   Arthritis    Asthma    Chronic cough    Colon polyps    hyperplastic   Diabetes mellitus without complication (HCC)    Diverticulosis    GERD (gastroesophageal reflux disease)    on prilosec   GLA deficiency (HCC)    Glaucoma of both eyes    Hypertension    Joint pain    Osteoarthritis    Pancreatitis    Shortness of breath dyspnea    Vitamin D deficiency     Family History  Problem Relation Age of Onset   Emphysema Mother        smoked   Diabetes Mother    Hypertension Mother    Colon cancer Father        7-s   Hypertension Father    Heart disease Father    High Cholesterol Father    Breast cancer Sister     Past Surgical History:  Procedure Laterality Date   ABDOMINAL HYSTERECTOMY     BREAST EXCISIONAL BIOPSY Right 1990   COLONOSCOPY N/A 08/22/2013   Procedure: COLONOSCOPY;  Surgeon: Hilarie Fredrickson, MD;  Location: [facility] ENDOSCOPY;  Service: Endoscopy;  Laterality: N/A;   ESOPHAGOGASTRODUODENOSCOPY N/A 08/22/2013   Procedure: ESOPHAGOGASTRODUODENOSCOPY (EGD);  Surgeon: Hilarie Fredrickson, MD;  Location[facility]COPY;  Service: Endoscopy;  Laterality: N/A;   ESOPHAGOGASTRODUODENOSCOPY (EGD) WITH PROPOFOL N/A 08/19/2015   Procedure: ESOPHAGOGASTRODUODENOSCOPY (EGD) WITH PROPOFOL;  Surgeon: Hilarie Fredrickson, MD;  Location: [facility] ENDOSCOPY;  Service: Endoscopy;  Laterality: N/A;   HERNIA REPAIR     HYSTEROSCOPY WITH D & C  01/2005   for uterine fibroids.    INSERTION OF MESH N/A 08/24/2013   Procedure: INSERTION OF MESH;  Surgeon: Mariella Saa, MD;  Location: [facility] OR;  Service: General;  Laterality: N/A;   JOINT REPLACEMENT Right    LIPOMA EXCISION Left 06/21/2015   Procedure: EXCISION OF LEFT SCALP LIPOMA;  Surgeon: Luretha Murphy, MD;  Location: [facility];  Service: General;  Laterality: Left;    PANNICULECTOMY N/A 08/24/2013   Procedure: PANNICULECTOMY;  Surgeon: Mariella Saa, MD;  Location: [facility] OR;  Service: General;  Laterality: N/A;   TOTAL ABDOMINAL HYSTERECTOMY W/ BILATERAL SALPINGOOPHORECTOMY  05/2005   TOTAL HIP ARTHROPLASTY Right 12/12/2013   Procedure: RIGHT TOTAL HIP ARTHROPLASTY ANTERIOR APPROACH;  Surgeon: Kathryne Hitch, MD;  Location: [facility] OR;  Service: Orthopedics;  Laterality: Right;   VENTRAL HERNIA REPAIR N/A 08/24/2013   Procedure: HERNIA REPAIR VENTRAL ADULT;  Surgeon: Mariella Saa, MD;  Location: [facility] OR;  Service: General;  Laterality: N/A;   Social History   Occupational History   Occupation: Child psychotherapist  Tobacco Use   Smoking status: Former    Packs/day: 0.25    Years: 15.00    Additional pack years: 0.00    Total pack years: 3.75  Types: Cigarettes    Quit date: 06/22/1978    Years since quitting: 44.3   Smokeless tobacco: Never  Vaping Use   Vaping Use: Never used  Substance and Sexual Activity   Alcohol use: No    Alcohol/week: 0.0 standard drinks of alcohol   Drug use: No   Sexual activity: Not on file

## 2022-10-28 ENCOUNTER — Telehealth: Payer: Self-pay | Admitting: Orthopedic Surgery

## 2022-10-28 NOTE — Telephone Encounter (Signed)
Patient asking for something to calm her nerves during her MRI. Please advise

## 2022-11-02 ENCOUNTER — Other Ambulatory Visit: Payer: Self-pay | Admitting: Surgical

## 2022-11-02 MED ORDER — DIAZEPAM 2 MG PO TABS: 2.0000 mg | ORAL_TABLET | Freq: Once | ORAL | 0 refills | Status: AC

## 2022-11-02 NOTE — Telephone Encounter (Signed)
Notified patient.

## 2022-11-02 NOTE — Telephone Encounter (Signed)
Sent in rx for valium.

## 2022-11-09 NOTE — Pre-Procedure Instructions (Signed)
Surgical Instructions    Your procedure is scheduled on Nov 19, 2022.  Report to [facility] Main Entrance "A" at 5:30 A.M., then check in with the Admitting office.  Call this number if you have problems the morning of surgery:  [phone]  If you have any questions prior to your surgery date call [phone]: Open Monday-Friday 8am-4pm If you experience any cold or flu symptoms such as cough, fever, chills, shortness of breath, etc. between now and your scheduled surgery, please notify us at the above number.     Remember:  Do not eat after midnight the night before your surgery  You may drink clear liquids until 4:30 AM the morning of your surgery.   Clear liquids allowed are: Water, Non-Citrus Juices (without pulp), Carbonated Beverages, Clear Tea, Black Coffee Only (NO MILK, CREAM OR POWDERED CREAMER of any kind), and Gatorade.  Patient Instructions  The night before surgery:  No food after midnight. ONLY clear liquids after midnight  The day of surgery (if you have diabetes): Drink ONE (1) 12 oz G2 given to you in your pre admission testing appointment by 4:30 AM the morning of surgery. Drink in one sitting. Do not sip.  This drink was given to you during your hospital  pre-op appointment visit.  Nothing else to drink after completing the  12 oz bottle of G2.         If you have questions, please contact your surgeon's office.     Take these medicines the morning of surgery with A SIP OF WATER:  cetirizine (ZYRTEC ALLERGY)   omeprazole (PRILOSEC)     May take these medicines IF NEEDED:  albuterol (PROVENTIL) nebulizer solution   budesonide-formoterol (SYMBICORT) inhaler    As of today, STOP taking any Aspirin (unless otherwise instructed by your surgeon) Aleve, Naproxen, Ibuprofen, Motrin, Advil, Goody's, BC's, all herbal medications, fish oil, and all vitamins.   WHAT DO I DO ABOUT MY DIABETES MEDICATION?   Do not take metFORMIN (GLUCOPHAGE), sitaGLIPtin  (JANUVIA) or pioglitazone (ACTOS) the morning of surgery.  STOP taking your FARXIGA three days prior to surgery. Your last dose will be May 26th.    HOW TO MANAGE YOUR DIABETES BEFORE AND AFTER SURGERY  Why is it important to control my blood sugar before and after surgery? Improving blood sugar levels before and after surgery helps healing and can limit problems. A way of improving blood sugar control is eating a healthy diet by:  Eating less sugar and carbohydrates  Increasing activity/exercise  Talking with your doctor about reaching your blood sugar goals High blood sugars (greater than 180 mg/dL) can raise your risk of infections and slow your recovery, so you will need to focus on controlling your diabetes during the weeks before surgery. Make sure that the doctor who takes care of your diabetes knows about your planned surgery including the date and location.  How do I manage my blood sugar before surgery? Check your blood sugar at least 4 times a day, starting 2 days before surgery, to make sure that the level is not too high or low.  Check your blood sugar the morning of your surgery when you wake up and every 2 hours until you get to the Short Stay unit.  If your blood sugar is less than 70 mg/dL, you will need to treat for low blood sugar: Do not take insulin. Treat a low blood sugar (less than 70 mg/dL) with  cup of clear juice (cranberry or apple),  4 glucose tablets, OR glucose gel. Recheck blood sugar in 15 minutes after treatment (to make sure it is greater than 70 mg/dL). If your blood sugar is not greater than 70 mg/dL on recheck, call [phone] for further instructions. Report your blood sugar to the short stay nurse when you get to Short Stay.  If you are admitted to the hospital after surgery: Your blood sugar will be checked by the staff and you will probably be given insulin after surgery (instead of oral diabetes medicines) to make sure you have good blood  sugar levels. The goal for blood sugar control after surgery is 80-180 mg/dL.                     Do NOT Smoke (Tobacco/Vaping) for 24 hours prior to your procedure.  If you use a CPAP at night, you may bring your mask/headgear for your overnight stay.   Contacts, glasses, piercing's, hearing aid's, dentures or partials may not be worn into surgery, please bring cases for these belongings.    For patients admitted to the hospital, discharge time will be determined by your treatment team.   Patients discharged the day of surgery will not be allowed to drive home, and someone needs to stay with them for 24 hours.  SURGICAL WAITING ROOM VISITATION Patients having surgery or a procedure may have no more than 2 support people in the waiting area - these visitors may rotate.   Children under the age of 67 must have an adult with them who is not the patient. If the patient needs to stay at the hospital during part of their recovery, the visitor guidelines for inpatient rooms apply. Pre-op nurse will coordinate an appropriate time for 1 support person to accompany patient in pre-op.  This support person may not rotate.   Please refer to the [facility] website for the visitor guidelines for Inpatients (after your surgery is over and you are in a regular room).   If you received a COVID test during your pre-op visit  it is requested that you wear a mask when out in public, stay away from anyone that may not be feeling well and notify your surgeon if you develop symptoms. If you have been in contact with anyone that has tested positive in the last 10 days please notify you surgeon.    Pre-operative 5 CHG Bath Instructions   You can play a key role in reducing the risk of infection after surgery. Your skin needs to be as free of germs as possible. You can reduce the number of germs on your skin by washing with CHG (chlorhexidine gluconate) soap before surgery. CHG is an antiseptic soap that kills  germs and continues to kill germs even after washing.   DO NOT use if you have an allergy to chlorhexidine/CHG or antibacterial soaps. If your skin becomes reddened or irritated, stop using the CHG and notify one of our RNs at [phone].   Please shower with the CHG soap starting 4 days before surgery using the following schedule:     Please keep in mind the following:  DO NOT shave, including legs and underarms, starting the day of your first shower.   You may shave your face at any point before/day of surgery.  Place clean sheets on your bed the day you start using CHG soap. Use a clean washcloth (not used since being washed) for each shower. DO NOT sleep with pets once you start using the CHG.  CHG Shower Instructions:  If you choose to wash your hair and private area, wash first with your normal shampoo/soap.  After you use shampoo/soap, rinse your hair and body thoroughly to remove shampoo/soap residue.  Turn the water OFF and apply about 3 tablespoons (45 ml) of CHG soap to a CLEAN washcloth.  Apply CHG soap ONLY FROM YOUR NECK DOWN TO YOUR TOES (washing for 3-5 minutes)  DO NOT use CHG soap on face, private areas, open wounds, or sores.  Pay special attention to the area where your surgery is being performed.  If you are having back surgery, having someone wash your back for you may be helpful. Wait 2 minutes after CHG soap is applied, then you may rinse off the CHG soap.  Pat dry with a clean towel  Put on clean clothes/pajamas   If you choose to wear lotion, please use ONLY the CHG-compatible lotions on the back of this paper.     Additional instructions for the day of surgery: DO NOT APPLY any lotions, deodorants, cologne, or perfumes.   Do not wear jewelry or makeup Do not wear nail polish, gel polish, artificial nails, or any other type of covering on natural nails (fingers and toes) Put on clean/comfortable clothes.  Brush your teeth.  Ask your nurse before applying  any prescription medications to the skin.      CHG Compatible Lotions   Aveeno Moisturizing lotion  Cetaphil Moisturizing Cream  Cetaphil Moisturizing Lotion  Clairol Herbal Essence Moisturizing Lotion, Dry Skin  Clairol Herbal Essence Moisturizing Lotion, Extra Dry Skin  Clairol Herbal Essence Moisturizing Lotion, Normal Skin  Curel Age Defying Therapeutic Moisturizing Lotion with Alpha Hydroxy  Curel Extreme Care Body Lotion  Curel Soothing Hands Moisturizing Hand Lotion  Curel Therapeutic Moisturizing Cream, Fragrance-Free  Curel Therapeutic Moisturizing Lotion, Fragrance-Free  Curel Therapeutic Moisturizing Lotion, Original Formula  Eucerin Daily Replenishing Lotion  Eucerin Dry Skin Therapy Plus Alpha Hydroxy Crme  Eucerin Dry Skin Therapy Plus Alpha Hydroxy Lotion  Eucerin Original Crme  Eucerin Original Lotion  Eucerin Plus Crme Eucerin Plus Lotion  Eucerin TriLipid Replenishing Lotion  Keri Anti-Bacterial Hand Lotion  Keri Deep Conditioning Original Lotion Dry Skin Formula Softly Scented  Keri Deep Conditioning Original Lotion, Fragrance Free Sensitive Skin Formula  Keri Lotion Fast Absorbing Fragrance Free Sensitive Skin Formula  Keri Lotion Fast Absorbing Softly Scented Dry Skin Formula  Keri Original Lotion  Keri Skin Renewal Lotion Keri Silky Smooth Lotion  Keri Silky Smooth Sensitive Skin Lotion  Nivea Body Creamy Conditioning Oil  Nivea Body Extra Enriched Lotion  Nivea Body Original Lotion  Nivea Body Sheer Moisturizing Lotion Nivea Crme  Nivea Skin Firming Lotion  NutraDerm 30 Skin Lotion  NutraDerm Skin Lotion  NutraDerm Therapeutic Skin Cream  NutraDerm Therapeutic Skin Lotion  ProShield Protective Hand Cream  Provon moisturizing lotion    Please read over the following fact sheets that you were given.

## 2022-11-10 ENCOUNTER — Other Ambulatory Visit: Payer: Self-pay

## 2022-11-10 ENCOUNTER — Encounter (HOSPITAL_COMMUNITY): Payer: Self-pay

## 2022-11-10 ENCOUNTER — Encounter (HOSPITAL_COMMUNITY)
Admission: RE | Admit: 2022-11-10 | Discharge: 2022-11-10 | Disposition: A | Discharge status: Home / Self Care | Payer: BC Managed Care – PPO | Source: Ambulatory Visit | Attending: Orthopedic Surgery | Admitting: Orthopedic Surgery

## 2022-11-10 VITALS — BP 138/73 | HR 63 | Temp 98.5°F | Resp 18 | Ht 62.0 in | Wt 162.0 lb

## 2022-11-10 DIAGNOSIS — E119 Type 2 diabetes mellitus without complications: Secondary | ICD-10-CM | POA: Insufficient documentation

## 2022-11-10 DIAGNOSIS — Z01818 Encounter for other preprocedural examination: Secondary | ICD-10-CM

## 2022-11-10 DIAGNOSIS — Z01812 Encounter for preprocedural laboratory examination: Secondary | ICD-10-CM | POA: Diagnosis present

## 2022-11-10 HISTORY — DX: Cerebral infarction, unspecified: I63.9

## 2022-11-10 LAB — URINALYSIS, ROUTINE W REFLEX MICROSCOPIC
Bacteria, UA: NONE SEEN
Bilirubin Urine: NEGATIVE
Glucose, UA: >=500 mg/dL — AB
Hgb urine dipstick: NEGATIVE
Ketones, ur: NEGATIVE mg/dL
Leukocytes,Ua: NEGATIVE
Nitrite: NEGATIVE
Protein, ur: NEGATIVE mg/dL
Specific Gravity, Urine: 1.018 (ref 1.005–1.030)
pH: 5 (ref 5.0–8.0)

## 2022-11-10 LAB — CBC
HCT: 40.3 % (ref 36.0–46.0)
Hemoglobin: 13 g/dL (ref 12.0–15.0)
MCH: 29.5 pg (ref 26.0–34.0)
MCHC: 32.3 g/dL (ref 30.0–36.0)
MCV: 91.6 fL (ref 80.0–100.0)
Platelets: 227 10*3/uL (ref 150–400)
RBC: 4.4 MIL/uL (ref 3.87–5.11)
RDW: 13.8 % (ref 11.5–15.5)
WBC: 5 10*3/uL (ref 4.0–10.5)
nRBC: 0 % (ref 0.0–0.2)

## 2022-11-10 LAB — HEMOGLOBIN A1C
Hgb A1c MFr Bld: 6.4 % — ABNORMAL HIGH (ref 4.8–5.6)
Mean Plasma Glucose: 136.98 mg/dL

## 2022-11-10 LAB — GLUCOSE, CAPILLARY: Glucose-Capillary: 97 mg/dL (ref 70–99)

## 2022-11-10 LAB — BASIC METABOLIC PANEL
Anion gap: 7 (ref 5–15)
BUN: 13 mg/dL (ref 8–23)
CO2: 29 mmol/L (ref 22–32)
Calcium: 9.7 mg/dL (ref 8.9–10.3)
Chloride: 103 mmol/L (ref 98–111)
Creatinine, Ser: 0.99 mg/dL (ref 0.44–1.00)
GFR, Estimated: >60 mL/min (ref >60)
Glucose, Bld: 95 mg/dL (ref 70–99)
Potassium: 3.9 mmol/L (ref 3.5–5.1)
Sodium: 139 mmol/L (ref 135–145)

## 2022-11-10 LAB — SURGICAL PCR SCREEN
MRSA, PCR: NEGATIVE
Staphylococcus aureus: NEGATIVE

## 2022-11-10 NOTE — Progress Notes (Signed)
PCP - Dr. Tally Joe Cardiologist - Denies  PPM/ICD - Denies Device Orders - n/a Rep Notified - n/a  Chest x-ray - n/a  EKG - 04/24/2022 Stress Test - Denies ECHO - 03/28/2019 Cardiac Cath - Denies  Sleep Study - Denies CPAP - n/a  Pt is DM2. She checks her sugar 1-2x/day. Normal fasting blood sugar is 80-110. CBG at pre-op 97. She has not had breakfast this morning, but has had a cup of coffee with cream  Last dose of GLP1 agonist-  n/a GLP1 instructions: n/a  Blood Thinner Instructions: n/a  Aspirin Instructions: n/a  ERAS Protcol - Clear liquids until 0430 morning of surgery PRE-SURGERY Ensure or G2- G2 given to pt with instructions  COVID TEST- n/a   Anesthesia review: No.   Patient denies shortness of breath, fever, cough and chest pain at PAT appointment. Pt denies any respiratory illness/infection in the last two months.   All instructions explained to the patient, with a verbal understanding of the material. Patient agrees to go over the instructions while at home for a better understanding. Patient also instructed to self quarantine after being tested for COVID-19. The opportunity to ask questions was provided.

## 2022-11-11 LAB — URINE CULTURE

## 2022-11-13 ENCOUNTER — Ambulatory Visit
Admission: RE | Admit: 2022-11-13 | Discharge: 2022-11-13 | Disposition: A | Discharge status: Home / Self Care | Payer: BC Managed Care – PPO | Source: Ambulatory Visit | Attending: Orthopedic Surgery | Admitting: Orthopedic Surgery

## 2022-11-13 DIAGNOSIS — D1721 Benign lipomatous neoplasm of skin and subcutaneous tissue of right arm: Secondary | ICD-10-CM

## 2022-11-13 DIAGNOSIS — M25511 Pain in right shoulder: Secondary | ICD-10-CM

## 2022-11-13 MED ORDER — GADOPICLENOL 0.5 MMOL/ML IV SOLN
7.5000 mL | Freq: Once | INTRAVENOUS | Status: AC | PRN
  Administered 2022-11-13: 7.5 mL via INTRAVENOUS

## 2022-11-19 ENCOUNTER — Ambulatory Visit (HOSPITAL_COMMUNITY): Payer: BC Managed Care – PPO | Admitting: Anesthesiology

## 2022-11-19 ENCOUNTER — Other Ambulatory Visit: Payer: Self-pay

## 2022-11-19 ENCOUNTER — Observation Stay (HOSPITAL_COMMUNITY): Payer: BC Managed Care – PPO

## 2022-11-19 ENCOUNTER — Observation Stay (HOSPITAL_COMMUNITY)
Admission: RE | Admit: 2022-11-19 | Discharge: 2022-11-21 | Disposition: A | Discharge status: Home / Self Care | Payer: BC Managed Care – PPO | Attending: Orthopedic Surgery | Admitting: Orthopedic Surgery

## 2022-11-19 ENCOUNTER — Encounter (HOSPITAL_COMMUNITY): Payer: Self-pay | Admitting: Orthopedic Surgery

## 2022-11-19 ENCOUNTER — Encounter (HOSPITAL_COMMUNITY)
Admission: RE | Disposition: A | Discharge status: Home / Self Care | Payer: Self-pay | Source: Home / Self Care | Attending: Orthopedic Surgery

## 2022-11-19 DIAGNOSIS — Z8673 Personal history of transient ischemic attack (TIA), and cerebral infarction without residual deficits: Secondary | ICD-10-CM | POA: Insufficient documentation

## 2022-11-19 DIAGNOSIS — I10 Essential (primary) hypertension: Secondary | ICD-10-CM | POA: Insufficient documentation

## 2022-11-19 DIAGNOSIS — Z01818 Encounter for other preprocedural examination: Secondary | ICD-10-CM

## 2022-11-19 DIAGNOSIS — Z96641 Presence of right artificial hip joint: Secondary | ICD-10-CM | POA: Insufficient documentation

## 2022-11-19 DIAGNOSIS — Z79899 Other long term (current) drug therapy: Secondary | ICD-10-CM | POA: Diagnosis not present

## 2022-11-19 DIAGNOSIS — M12811 Other specific arthropathies, not elsewhere classified, right shoulder: Secondary | ICD-10-CM | POA: Diagnosis present

## 2022-11-19 DIAGNOSIS — Z7984 Long term (current) use of oral hypoglycemic drugs: Secondary | ICD-10-CM | POA: Insufficient documentation

## 2022-11-19 DIAGNOSIS — Z7982 Long term (current) use of aspirin: Secondary | ICD-10-CM | POA: Insufficient documentation

## 2022-11-19 DIAGNOSIS — D179 Benign lipomatous neoplasm, unspecified: Secondary | ICD-10-CM | POA: Diagnosis not present

## 2022-11-19 DIAGNOSIS — M7521 Bicipital tendinitis, right shoulder: Secondary | ICD-10-CM | POA: Insufficient documentation

## 2022-11-19 DIAGNOSIS — E119 Type 2 diabetes mellitus without complications: Secondary | ICD-10-CM | POA: Insufficient documentation

## 2022-11-19 DIAGNOSIS — D1721 Benign lipomatous neoplasm of skin and subcutaneous tissue of right arm: Secondary | ICD-10-CM | POA: Diagnosis not present

## 2022-11-19 DIAGNOSIS — J45909 Unspecified asthma, uncomplicated: Secondary | ICD-10-CM | POA: Diagnosis not present

## 2022-11-19 DIAGNOSIS — M19011 Primary osteoarthritis, right shoulder: Secondary | ICD-10-CM | POA: Diagnosis not present

## 2022-11-19 DIAGNOSIS — Z87891 Personal history of nicotine dependence: Secondary | ICD-10-CM | POA: Insufficient documentation

## 2022-11-19 DIAGNOSIS — Z96611 Presence of right artificial shoulder joint: Secondary | ICD-10-CM

## 2022-11-19 HISTORY — PX: REVERSE SHOULDER ARTHROPLASTY: SHX5054

## 2022-11-19 HISTORY — PX: BICEPT TENODESIS: SHX5116

## 2022-11-19 LAB — GLUCOSE, CAPILLARY
Glucose-Capillary: 81 mg/dL (ref 70–99)
Glucose-Capillary: 116 mg/dL — ABNORMAL HIGH (ref 70–99)
Glucose-Capillary: 111 mg/dL — ABNORMAL HIGH (ref 70–99)

## 2022-11-19 SURGERY — ARTHROPLASTY, SHOULDER, TOTAL, REVERSE
Anesthesia: General | Site: Shoulder | Laterality: Right

## 2022-11-19 MED ORDER — GLYCOPYRROLATE PF 0.2 MG/ML IJ SOSY
PREFILLED_SYRINGE | INTRAMUSCULAR | Status: AC
  Filled 2022-11-19: qty 1

## 2022-11-19 MED ORDER — ROCURONIUM BROMIDE 10 MG/ML (PF) SYRINGE
PREFILLED_SYRINGE | INTRAVENOUS | Status: AC
  Filled 2022-11-19: qty 10

## 2022-11-19 MED ORDER — LATANOPROST 0.005 % OP SOLN
1.0000 [drp] | Freq: Every day | OPHTHALMIC | Status: DC
  Administered 2022-11-19 – 2022-11-20 (×2): 1 [drp] via OPHTHALMIC
  Filled 2022-11-19: qty 2.5

## 2022-11-19 MED ORDER — ACETAMINOPHEN 500 MG PO TABS
1000.0000 mg | ORAL_TABLET | Freq: Four times a day (QID) | ORAL | Status: AC
  Administered 2022-11-19 – 2022-11-20 (×4): 1000 mg via ORAL
  Filled 2022-11-19 (×4): qty 2

## 2022-11-19 MED ORDER — TRAVOPROST (BAK FREE) 0.004 % OP SOLN
1.0000 [drp] | Freq: Every day | OPHTHALMIC | Status: DC
  Filled 2022-11-19: qty 2.5

## 2022-11-19 MED ORDER — CLONIDINE HCL (ANALGESIA) 100 MCG/ML EP SOLN
EPIDURAL | Status: DC | PRN
  Administered 2022-11-19: 100 ug

## 2022-11-19 MED ORDER — ACETAMINOPHEN 10 MG/ML IV SOLN: 1000.0000 mg | Freq: Once | INTRAVENOUS | Status: DC | PRN

## 2022-11-19 MED ORDER — ROCURONIUM BROMIDE 10 MG/ML (PF) SYRINGE
PREFILLED_SYRINGE | INTRAVENOUS | Status: DC | PRN
  Administered 2022-11-19: 70 mg via INTRAVENOUS

## 2022-11-19 MED ORDER — ONDANSETRON HCL 4 MG/2ML IJ SOLN
INTRAMUSCULAR | Status: DC | PRN
  Administered 2022-11-19: 4 mg via INTRAVENOUS

## 2022-11-19 MED ORDER — IRRISEPT - 450ML BOTTLE WITH 0.05% CHG IN STERILE WATER, USP 99.95% OPTIME
TOPICAL | Status: DC | PRN
  Administered 2022-11-19: 450 mL via TOPICAL

## 2022-11-19 MED ORDER — DEXAMETHASONE SODIUM PHOSPHATE 10 MG/ML IJ SOLN
INTRAMUSCULAR | Status: DC | PRN
  Administered 2022-11-19: 5 mg via INTRAVENOUS

## 2022-11-19 MED ORDER — MENTHOL 3 MG MT LOZG: 1.0000 | LOZENGE | OROMUCOSAL | Status: DC | PRN

## 2022-11-19 MED ORDER — DAPAGLIFLOZIN PROPANEDIOL 10 MG PO TABS
10.0000 mg | ORAL_TABLET | Freq: Every day | ORAL | Status: DC
  Administered 2022-11-20 – 2022-11-21 (×2): 10 mg via ORAL
  Filled 2022-11-19 (×4): qty 1

## 2022-11-19 MED ORDER — ONDANSETRON HCL 4 MG/2ML IJ SOLN
INTRAMUSCULAR | Status: AC
  Filled 2022-11-19: qty 2

## 2022-11-19 MED ORDER — LIDOCAINE 2% (20 MG/ML) 5 ML SYRINGE
INTRAMUSCULAR | Status: AC
  Filled 2022-11-19: qty 5

## 2022-11-19 MED ORDER — METOCLOPRAMIDE HCL 5 MG PO TABS: 5.0000 mg | ORAL_TABLET | Freq: Three times a day (TID) | ORAL | Status: DC | PRN

## 2022-11-19 MED ORDER — CEFAZOLIN SODIUM-DEXTROSE 2-4 GM/100ML-% IV SOLN
INTRAVENOUS | Status: AC
  Filled 2022-11-19: qty 100

## 2022-11-19 MED ORDER — PROPOFOL 10 MG/ML IV BOLUS
INTRAVENOUS | Status: AC
  Filled 2022-11-19: qty 20

## 2022-11-19 MED ORDER — LIDOCAINE HCL 1 % IJ SOLN
INTRAMUSCULAR | Status: AC
  Filled 2022-11-19: qty 20

## 2022-11-19 MED ORDER — GLYCOPYRROLATE PF 0.2 MG/ML IJ SOSY
PREFILLED_SYRINGE | INTRAMUSCULAR | Status: DC | PRN
  Administered 2022-11-19: .2 mg via INTRAVENOUS

## 2022-11-19 MED ORDER — FENTANYL CITRATE (PF) 100 MCG/2ML IJ SOLN: 25.0000 ug | INTRAMUSCULAR | Status: DC | PRN

## 2022-11-19 MED ORDER — DOCUSATE SODIUM 100 MG PO CAPS
100.0000 mg | ORAL_CAPSULE | Freq: Two times a day (BID) | ORAL | Status: DC
  Administered 2022-11-19 – 2022-11-21 (×4): 100 mg via ORAL
  Filled 2022-11-19 (×4): qty 1

## 2022-11-19 MED ORDER — PHENOL 1.4 % MT LIQD: 1.0000 | OROMUCOSAL | Status: DC | PRN

## 2022-11-19 MED ORDER — FENTANYL CITRATE (PF) 250 MCG/5ML IJ SOLN
INTRAMUSCULAR | Status: AC
  Filled 2022-11-19: qty 5

## 2022-11-19 MED ORDER — LIDOCAINE HCL 1 % IJ SOLN
INTRAMUSCULAR | Status: DC | PRN
  Administered 2022-11-19: 20 mL

## 2022-11-19 MED ORDER — VANCOMYCIN HCL 1000 MG IV SOLR
INTRAVENOUS | Status: AC
  Filled 2022-11-19: qty 20

## 2022-11-19 MED ORDER — STERILE WATER FOR IRRIGATION IR SOLN
Status: DC | PRN
  Administered 2022-11-19: 1000 mL

## 2022-11-19 MED ORDER — NON FORMULARY: 1.0000 [drp] | Freq: Every day | Status: DC

## 2022-11-19 MED ORDER — OXYCODONE HCL 5 MG PO TABS: 5.0000 mg | ORAL_TABLET | Freq: Once | ORAL | Status: DC | PRN

## 2022-11-19 MED ORDER — 0.9 % SODIUM CHLORIDE (POUR BTL) OPTIME
TOPICAL | Status: DC | PRN
  Administered 2022-11-19 (×4): 1000 mL

## 2022-11-19 MED ORDER — PROPOFOL 10 MG/ML IV BOLUS
INTRAVENOUS | Status: DC | PRN
  Administered 2022-11-19: 110 mg via INTRAVENOUS
  Administered 2022-11-19: 40 mg via INTRAVENOUS

## 2022-11-19 MED ORDER — PHENYLEPHRINE HCL-NACL 20-0.9 MG/250ML-% IV SOLN
INTRAVENOUS | Status: DC | PRN
  Administered 2022-11-19: 40 ug/min via INTRAVENOUS
  Administered 2022-11-19: 25 ug/min via INTRAVENOUS

## 2022-11-19 MED ORDER — MELATONIN 3 MG PO TABS
3.0000 mg | ORAL_TABLET | Freq: Every evening | ORAL | Status: DC | PRN
  Administered 2022-11-19: 3 mg via ORAL
  Filled 2022-11-19: qty 1

## 2022-11-19 MED ORDER — POVIDONE-IODINE 10 % EX SWAB
2.0000 | Freq: Once | CUTANEOUS | Status: AC
  Administered 2022-11-19: 2 via TOPICAL

## 2022-11-19 MED ORDER — VANCOMYCIN HCL IN DEXTROSE 1-5 GM/200ML-% IV SOLN
1000.0000 mg | INTRAVENOUS | Status: AC
  Administered 2022-11-19: 1000 mg via INTRAVENOUS
  Filled 2022-11-19: qty 200

## 2022-11-19 MED ORDER — BUPIVACAINE HCL (PF) 0.5 % IJ SOLN
INTRAMUSCULAR | Status: DC | PRN
  Administered 2022-11-19: 15 mL via PERINEURAL

## 2022-11-19 MED ORDER — INSULIN ASPART 100 UNIT/ML IJ SOLN: 0.0000 [IU] | INTRAMUSCULAR | Status: DC | PRN

## 2022-11-19 MED ORDER — ASPIRIN 81 MG PO TBEC
81.0000 mg | DELAYED_RELEASE_TABLET | Freq: Every day | ORAL | Status: DC
  Administered 2022-11-20 – 2022-11-21 (×2): 81 mg via ORAL
  Filled 2022-11-19 (×2): qty 1

## 2022-11-19 MED ORDER — TRANEXAMIC ACID-NACL 1000-0.7 MG/100ML-% IV SOLN
1000.0000 mg | INTRAVENOUS | Status: AC
  Administered 2022-11-19: 1000 mg via INTRAVENOUS
  Filled 2022-11-19: qty 100

## 2022-11-19 MED ORDER — ACETAMINOPHEN 325 MG PO TABS: 325.0000 mg | ORAL_TABLET | Freq: Four times a day (QID) | ORAL | Status: DC | PRN

## 2022-11-19 MED ORDER — DEXAMETHASONE SODIUM PHOSPHATE 10 MG/ML IJ SOLN
INTRAMUSCULAR | Status: AC
  Filled 2022-11-19: qty 1

## 2022-11-19 MED ORDER — ALBUTEROL SULFATE (2.5 MG/3ML) 0.083% IN NEBU: 2.5000 mg | INHALATION_SOLUTION | Freq: Four times a day (QID) | RESPIRATORY_TRACT | Status: DC | PRN

## 2022-11-19 MED ORDER — CHLORHEXIDINE GLUCONATE 0.12 % MT SOLN
15.0000 mL | Freq: Once | OROMUCOSAL | Status: AC
  Administered 2022-11-19: 15 mL via OROMUCOSAL
  Filled 2022-11-19: qty 15

## 2022-11-19 MED ORDER — METFORMIN HCL 500 MG PO TABS
1000.0000 mg | ORAL_TABLET | Freq: Two times a day (BID) | ORAL | Status: DC
  Administered 2022-11-19 – 2022-11-21 (×4): 1000 mg via ORAL
  Filled 2022-11-19 (×4): qty 2

## 2022-11-19 MED ORDER — ONDANSETRON HCL 4 MG/2ML IJ SOLN: 4.0000 mg | Freq: Four times a day (QID) | INTRAMUSCULAR | Status: DC | PRN

## 2022-11-19 MED ORDER — CEFAZOLIN SODIUM-DEXTROSE 1-4 GM/50ML-% IV SOLN
1.0000 g | Freq: Three times a day (TID) | INTRAVENOUS | Status: AC
  Administered 2022-11-19 – 2022-11-20 (×3): 1 g via INTRAVENOUS
  Filled 2022-11-19 (×3): qty 50

## 2022-11-19 MED ORDER — MIDAZOLAM HCL 2 MG/2ML IJ SOLN
INTRAMUSCULAR | Status: DC | PRN
  Administered 2022-11-19 (×2): 1 mg via INTRAVENOUS

## 2022-11-19 MED ORDER — METOCLOPRAMIDE HCL 5 MG/ML IJ SOLN: 5.0000 mg | Freq: Three times a day (TID) | INTRAMUSCULAR | Status: DC | PRN

## 2022-11-19 MED ORDER — FENTANYL CITRATE (PF) 250 MCG/5ML IJ SOLN
INTRAMUSCULAR | Status: DC | PRN
  Administered 2022-11-19: 50 ug via INTRAVENOUS

## 2022-11-19 MED ORDER — MIDAZOLAM HCL 2 MG/2ML IJ SOLN
INTRAMUSCULAR | Status: AC
  Filled 2022-11-19: qty 2

## 2022-11-19 MED ORDER — ONDANSETRON HCL 4 MG PO TABS: 4.0000 mg | ORAL_TABLET | Freq: Four times a day (QID) | ORAL | Status: DC | PRN

## 2022-11-19 MED ORDER — LIDOCAINE 2% (20 MG/ML) 5 ML SYRINGE
INTRAMUSCULAR | Status: DC | PRN
  Administered 2022-11-19: 100 mg via INTRAVENOUS

## 2022-11-19 MED ORDER — SODIUM CHLORIDE 0.9 % IV SOLN: Freq: Once | INTRAVENOUS | Status: AC

## 2022-11-19 MED ORDER — LACTATED RINGERS IV SOLN: INTRAVENOUS | Status: DC

## 2022-11-19 MED ORDER — VANCOMYCIN HCL 1000 MG IV SOLR
INTRAVENOUS | Status: DC | PRN
  Administered 2022-11-19: 2000 mg

## 2022-11-19 MED ORDER — ORAL CARE MOUTH RINSE: 15.0000 mL | Freq: Once | OROMUCOSAL | Status: AC

## 2022-11-19 MED ORDER — METHOCARBAMOL 1000 MG/10ML IJ SOLN: 500.0000 mg | Freq: Four times a day (QID) | INTRAVENOUS | Status: DC | PRN

## 2022-11-19 MED ORDER — BUPIVACAINE LIPOSOME 1.3 % IJ SUSP
INTRAMUSCULAR | Status: DC | PRN
  Administered 2022-11-19: 10 mL via PERINEURAL

## 2022-11-19 MED ORDER — MOMETASONE FURO-FORMOTEROL FUM 200-5 MCG/ACT IN AERO
2.0000 | INHALATION_SPRAY | Freq: Two times a day (BID) | RESPIRATORY_TRACT | Status: DC
  Administered 2022-11-19 – 2022-11-21 (×4): 2 via RESPIRATORY_TRACT
  Filled 2022-11-19: qty 8.8

## 2022-11-19 MED ORDER — MORPHINE SULFATE (PF) 4 MG/ML IV SOLN
INTRAVENOUS | Status: AC
  Filled 2022-11-19: qty 2

## 2022-11-19 MED ORDER — OXYCODONE HCL 5 MG PO TABS
5.0000 mg | ORAL_TABLET | ORAL | Status: DC | PRN
  Administered 2022-11-19 – 2022-11-20 (×3): 5 mg via ORAL
  Filled 2022-11-19 (×3): qty 1

## 2022-11-19 MED ORDER — MONTELUKAST SODIUM 10 MG PO TABS
10.0000 mg | ORAL_TABLET | Freq: Every day | ORAL | Status: DC
  Administered 2022-11-19 – 2022-11-20 (×2): 10 mg via ORAL
  Filled 2022-11-19 (×2): qty 1

## 2022-11-19 MED ORDER — CEFAZOLIN SODIUM-DEXTROSE 2-3 GM-%(50ML) IV SOLR
INTRAVENOUS | Status: DC | PRN
  Administered 2022-11-19: 2 g via INTRAVENOUS

## 2022-11-19 MED ORDER — IRBESARTAN 150 MG PO TABS
150.0000 mg | ORAL_TABLET | Freq: Every day | ORAL | Status: DC
  Administered 2022-11-19 – 2022-11-21 (×3): 150 mg via ORAL
  Filled 2022-11-19 (×3): qty 1

## 2022-11-19 MED ORDER — PANTOPRAZOLE SODIUM 40 MG PO TBEC
40.0000 mg | DELAYED_RELEASE_TABLET | Freq: Every day | ORAL | Status: DC
  Administered 2022-11-19 – 2022-11-21 (×3): 40 mg via ORAL
  Filled 2022-11-19 (×3): qty 1

## 2022-11-19 MED ORDER — ONDANSETRON HCL 4 MG/2ML IJ SOLN: 4.0000 mg | Freq: Once | INTRAMUSCULAR | Status: DC | PRN

## 2022-11-19 MED ORDER — METHOCARBAMOL 500 MG PO TABS
500.0000 mg | ORAL_TABLET | Freq: Four times a day (QID) | ORAL | Status: DC | PRN
  Administered 2022-11-19 – 2022-11-21 (×3): 500 mg via ORAL
  Filled 2022-11-19 (×3): qty 1

## 2022-11-19 MED ORDER — POVIDONE-IODINE 7.5 % EX SOLN: Freq: Once | CUTANEOUS | Status: AC

## 2022-11-19 MED ORDER — CLONIDINE HCL (ANALGESIA) 100 MCG/ML EP SOLN
EPIDURAL | Status: AC
  Filled 2022-11-19: qty 10

## 2022-11-19 MED ORDER — OXYCODONE HCL 5 MG/5ML PO SOLN: 5.0000 mg | Freq: Once | ORAL | Status: DC | PRN

## 2022-11-19 MED ORDER — HYDROMORPHONE HCL 1 MG/ML IJ SOLN
0.5000 mg | INTRAMUSCULAR | Status: DC | PRN
  Administered 2022-11-20 – 2022-11-21 (×4): 0.5 mg via INTRAVENOUS
  Filled 2022-11-19 (×4): qty 0.5

## 2022-11-19 MED ORDER — SUGAMMADEX SODIUM 200 MG/2ML IV SOLN
INTRAVENOUS | Status: DC | PRN
  Administered 2022-11-19: 200 mg via INTRAVENOUS

## 2022-11-19 MED ORDER — MORPHINE SULFATE 4 MG/ML IJ SOLN
INTRAMUSCULAR | Status: DC | PRN
  Administered 2022-11-19 (×2): 4 mg

## 2022-11-19 SURGICAL SUPPLY — 84 items
AID PSTN UNV HD RSTRNT DISP (MISCELLANEOUS) ×2
ALCOHOL 70% 16 OZ (MISCELLANEOUS) ×3 IMPLANT
APL PRP STRL LF DISP 70% ISPRP (MISCELLANEOUS) ×2
BAG COUNTER SPONGE SURGICOUNT (BAG) ×3 IMPLANT
BAG SPNG CNTER NS LX DISP (BAG) ×2
BASEPLATE AUG MED W-TAPER (Plate) IMPLANT
BEARING HUMERAL SHLDER 36M STD (Shoulder) IMPLANT
BIT DRILL 1.5X85 (BIT) IMPLANT
BIT DRILL 2.7 W/STOP DISP (BIT) IMPLANT
BIT DRILL TWIST 2.7 (BIT) IMPLANT
BLADE SAW SGTL 13X75X1.27 (BLADE) ×3 IMPLANT
BRNG HUM STD 36 RVRS SHLDR (Shoulder) ×2 IMPLANT
BSPLAT GLND MED AUG TPR ADPR (Plate) ×2 IMPLANT
CHLORAPREP W/TINT 26 (MISCELLANEOUS) ×3 IMPLANT
CNTNR URN SCR LID CUP LEK RST (MISCELLANEOUS) IMPLANT
CONT SPEC 4OZ STRL OR WHT (MISCELLANEOUS) ×6
COOLER ICEMAN CLASSIC (MISCELLANEOUS) ×3 IMPLANT
COVER SURGICAL LIGHT HANDLE (MISCELLANEOUS) ×3 IMPLANT
DRAPE INCISE IOBAN 66X45 STRL (DRAPES) ×3 IMPLANT
DRAPE U-SHAPE 47X51 STRL (DRAPES) ×6 IMPLANT
DRSG AQUACEL AG ADV 3.5X 6 (GAUZE/BANDAGES/DRESSINGS) IMPLANT
DRSG AQUACEL AG ADV 3.5X10 (GAUZE/BANDAGES/DRESSINGS) ×3 IMPLANT
ELECT BLADE 4.0 EZ CLEAN MEGAD (MISCELLANEOUS) ×2
ELECT REM PT RETURN 9FT ADLT (ELECTROSURGICAL) ×2
ELECTRODE BLDE 4.0 EZ CLN MEGD (MISCELLANEOUS) ×3 IMPLANT
ELECTRODE REM PT RTRN 9FT ADLT (ELECTROSURGICAL) ×3 IMPLANT
GAUZE SPONGE 4X4 12PLY STRL (GAUZE/BANDAGES/DRESSINGS) IMPLANT
GAUZE SPONGE 4X4 12PLY STRL LF (GAUZE/BANDAGES/DRESSINGS) ×3 IMPLANT
GLENOID SPHERE 36+6 (Joint) IMPLANT
GLOVE BIOGEL PI IND STRL 6.5 (GLOVE) ×3 IMPLANT
GLOVE BIOGEL PI IND STRL 8 (GLOVE) ×3 IMPLANT
GLOVE ECLIPSE 6.5 STRL STRAW (GLOVE) ×3 IMPLANT
GLOVE ECLIPSE 8.0 STRL XLNG CF (GLOVE) ×3 IMPLANT
GOWN STRL REUS W/ TWL LRG LVL3 (GOWN DISPOSABLE) ×3 IMPLANT
GOWN STRL REUS W/ TWL XL LVL3 (GOWN DISPOSABLE) ×3 IMPLANT
GOWN STRL REUS W/TWL LRG LVL3 (GOWN DISPOSABLE) ×2
GOWN STRL REUS W/TWL XL LVL3 (GOWN DISPOSABLE) ×2
GUIDE MODEL REV SHLD RT (ORTHOPEDIC DISPOSABLE SUPPLIES) IMPLANT
HYDROGEN PEROXIDE 16OZ (MISCELLANEOUS) ×3 IMPLANT
JET LAVAGE IRRISEPT WOUND (IRRIGATION / IRRIGATOR) ×2
KIT BASIN OR (CUSTOM PROCEDURE TRAY) ×3 IMPLANT
KIT TURNOVER KIT B (KITS) ×3 IMPLANT
LAVAGE JET IRRISEPT WOUND (IRRIGATION / IRRIGATOR) ×3 IMPLANT
MANIFOLD NEPTUNE II (INSTRUMENTS) ×3 IMPLANT
NDL SUT 6 .5 CRC .975X.05 MAYO (NEEDLE) IMPLANT
NEEDLE MAYO TAPER (NEEDLE)
NS IRRIG 1000ML POUR BTL (IV SOLUTION) ×3 IMPLANT
PACK SHOULDER (CUSTOM PROCEDURE TRAY) ×3 IMPLANT
PAD COLD SHLDR WRAP-ON (PAD) ×3 IMPLANT
PIN HUMERAL STMN 3.2MMX9IN (INSTRUMENTS) IMPLANT
PIN STEINMANN THREADED TIP (PIN) IMPLANT
PIN THREADED REVERSE (PIN) IMPLANT
REAMER GUIDE BUSHING SURG DISP (MISCELLANEOUS) IMPLANT
REAMER GUIDE MINI 20 ZI (ORTHOPEDIC DISPOSABLE SUPPLIES) IMPLANT
REAMER GUIDE W/SCREW AUG (MISCELLANEOUS) IMPLANT
RESTRAINT HEAD UNIVERSAL NS (MISCELLANEOUS) ×3 IMPLANT
RETRIEVER SUT HEWSON (MISCELLANEOUS) ×3 IMPLANT
SCREW BONE LOCKING 4.75X30X3.5 (Screw) IMPLANT
SCREW BONE STRL 6.5MMX30MM (Screw) IMPLANT
SCREW LOCKING 4.75MMX15MM (Screw) IMPLANT
SHOULDER HUMERAL BEAR 36M STD (Shoulder) ×2 IMPLANT
SLING ARM IMMOBILIZER LRG (SOFTGOODS) ×3 IMPLANT
SOL PREP POV-IOD 4OZ 10% (MISCELLANEOUS) ×3 IMPLANT
SPONGE T-LAP 18X18 ~~LOC~~+RFID (SPONGE) ×3 IMPLANT
STEM HUMERAL STRL 7MMX83MM (Stem) IMPLANT
STRIP CLOSURE SKIN 1/2X4 (GAUZE/BANDAGES/DRESSINGS) ×3 IMPLANT
SUCTION FRAZIER HANDLE 10FR (MISCELLANEOUS) ×2
SUCTION TUBE FRAZIER 10FR DISP (MISCELLANEOUS) ×3 IMPLANT
SUT BROADBAND TAPE 2PK 1.5 (SUTURE) IMPLANT
SUT MNCRL AB 3-0 PS2 18 (SUTURE) ×3 IMPLANT
SUT SILK 2 0 TIES 10X30 (SUTURE) ×3 IMPLANT
SUT VIC AB 0 CT1 27 (SUTURE) ×12
SUT VIC AB 0 CT1 27XBRD ANBCTR (SUTURE) ×12 IMPLANT
SUT VIC AB 1 CT1 27 (SUTURE) ×4
SUT VIC AB 1 CT1 27XBRD ANBCTR (SUTURE) ×6 IMPLANT
SUT VIC AB 1 CT1 36 (SUTURE) IMPLANT
SUT VIC AB 2-0 CT1 (SUTURE) IMPLANT
SUT VIC AB 2-0 CT1 27 (SUTURE) ×6
SUT VIC AB 2-0 CT1 TAPERPNT 27 (SUTURE) ×9 IMPLANT
SUT VICRYL 0 UR6 27IN ABS (SUTURE) ×6 IMPLANT
TAPE STRIPS DRAPE STRL (GAUZE/BANDAGES/DRESSINGS) IMPLANT
TOWEL GREEN STERILE (TOWEL DISPOSABLE) ×3 IMPLANT
TRAY HUM REV SHOULDER STD +6 (Shoulder) IMPLANT
WATER STERILE IRR 1000ML POUR (IV SOLUTION) ×3 IMPLANT

## 2022-11-19 NOTE — Progress Notes (Signed)
Ortho care on call notified that patient is requesting her HS eye gtt Travoprost

## 2022-11-19 NOTE — Progress Notes (Signed)
Pt arrived to 6 north room 32 alert and oriented x4. Hydrocolloid dressing in place on right shoulder. Dressing clean, dry, and intact. Ice pack and sling also on right shoulder. Pain level 4/10. Bed in lowest position. Call light in reach. Family at bed side will continue to monitor pt.

## 2022-11-19 NOTE — Op Note (Signed)
NAMESHEWANNA, [name] MEDICAL RECORD NO: [id number] ACCOUNT NO: [id number] DATE OF BIRTH: 1955/08/20 FACILITY: [facility] LOCATION: [facility] PHYSICIAN: Graylin Shiver. August Saucer, MD  Operative Report   DATE OF PROCEDURE: 11/19/2022  PREOPERATIVE DIAGNOSES:  Right shoulder rotator cuff arthropathy, biceps tendinitis and lipoma x2.  POSTOPERATIVE DIAGNOSES:  Right shoulder rotator cuff arthropathy, biceps tendinitis and lipoma x2.  PROCEDURE:   1.  Right shoulder reverse shoulder replacement using Biomet comprehensive reverse shoulder medium augmented baseplate with 1 central compression screw, 3 peripheral locking screws, glenosphere 36+6 with mini humeral stem size 7 and 36 mm standard  bearing with +6 humeral mini tray and 6 tapes for subscap repair. 2. Biceps tenodesis. 3.  Removal of lipoma anterior shoulder.   4.  Removal of lipoma, posterior axillary region through a separate incision.  SURGEON:  Graylin Shiver. August Saucer, MD  ASSISTANT:  Karenann Cai, PA.  INDICATIONS:  The patient is a 67 year old patient with right shoulder pain, who presents for operative management after explanation of risks and benefits.  PROCEDURE: The patient was brought to the operating room where general anesthetic was induced.  Preoperative antibiotics administered.  Timeout was called.  The patient was placed in the beach chair position with the head in neutral position.  Right  shoulder, arm and hand prescrubbed with hydrogen peroxide followed by alcohol and Betadine, which was allowed to air dry then prepped with DuraPrep solution and draped in sterile manner.  Ioban used to seal the entire operative field.  Timeout was  called.  The patient was tested with Ancef and did well.  Vancomycin also given.  Next, a deltopectoral approach was made.  Skin and subcutaneous tissue sharply divided.  Cephalic vein was mobilized medially.  Lipoma of the anterior aspect of the  acromion was identified.  It measured about 2.5 x 2.5 x 1 cm.   It was excised through the posterior skin flap.  This was sent off as a specimen.  Next, a deltopectoral interval was opened.  The deltoid elevated manually off its anterior attachment.   Next, the biceps tendon was tenodesed to the pec tendon, which had been released about 1 cm.  This was done with five 0 Vicryl sutures.  Rotator interval was then opened.  Axillary nerve was palpated and protected at all times during the case.   Circumflex vessels were ligated.  Subacromial and subdeltoid adhesions were freed.  The patient had torn and retracted rotator cuff tear.  Significant arthritis in the superior aspect of the humeral head.  Subscap was then detached using a 15 blade from  the lesser tuberosity and tagged with two 0 Vicryl sutures.  Progressive external rotation was performed and we mobilized the humerus laterally by detaching capsule around to the 5 o'clock position including about 2 cm inferior to the humeral neck.  The  humeral head was dislocated.  Proximal reaming was performed up to a size 7.  Then, with that reamer in position, we cut the head in 30 degrees of retroversion, which is proportional to its native version and then we broached up to a size 7 with very  good press fit obtained.  Next, a cap was placed.  Posterior retractor was placed.  Labrum was circumferentially excised with the patient reversed.  Care was taken to avoid injury to the axillary nerve.  Next, using the patient-specific guide, the guide  pin was placed in appropriate position on the glenoid.  Full release of the subscap was performed.  Reaming was then performed  and superior reaming was performed for the augment.  Very good fit was obtained.  Bone quality was excellent.  Augmented  baseplate was placed after irrigation with IrriSept which was utilized after the incision as well as after the arthrotomy.  Baseplate was placed and we placed 1 central compression screw 30 mm with excellent compression and 3 peripheral  locking screws  with also very good bone.  Trial reduction was performed with the +3 glenosphere 36 mm and a +6 glenosphere 36 mm.  This was done with a +6 humeral tray offset.  The +6 gave the best stability with reduction being "two fingers tight."  Trial components  were removed.  Thorough irrigation was performed.  The glenosphere was then placed with no inferior offset.  Morse taper with excellent fit.  Then, the suture tapes were placed through the lesser tuberosity x6.  This was done through 3 drill holes.  The  IrriSept solution then placed into the canal, which was then removed and then vancomycin powder placed.  The stem was then placed with very good press fit obtained.  Same stability parameters were maintained with the trial +6 humeral tray offset with  standard liner.  This was then placed.  The patient had excellent stability with adduction, extension, and forward force along with internal and external rotation.  Thorough irrigation was performed.  Axillary nerve again palpated and found to be intact.   Three liters of irrigating solution utilized. Next the subscap was repaired to the lesser tuberosity fairly easily with the arm in 35 degrees of external rotation.  This was done with the SutureTapes and Nice knots.  Next IrriSept solution again  utilized on the prosthesis and then vancomycin powder placed on the prosthesis.  Deltopectoral interval was then closed using #1 Vicryl suture.  Then, closed using a 0 Vicryl suture, 2-0 Vicryl suture, and 3-0 Monocryl.  Steri-Strips and Aquacel dressing  applied.  Attention then directed towards the posterior axillary lipoma.  Incision made about 2 cm away from the posterior axillary crease.  This was about a 5 cm incision.  Skin and subcutaneous tissue were sharply divided.  Bleeding points encountered  controlled using electrocautery.  The lipoma was identified and removed and blocked.  Measured about 5 x 5 cm.  Irrigation was performed and  vancomycin powder placed into this area, which was then closed in layers using #1 Vicryl suture, 0 Vicryl  suture, 2-0 Vicryl suture, and 3-0 Monocryl and Steri-Strips and Aquacel dressing applied.  The patient was then transferred to the recovery room in stable condition.  The patient tolerated the procedure well without immediate complications.  Luke's  assistance was required at all times for retraction, opening, closing, mobilization of tissue.  His assistance was a medical necessity.     [email or web address] D: 11/19/2022 11:46:09 am T: 11/19/2022 12:33:00 pm  JOB: [id number]/ [id number]

## 2022-11-19 NOTE — Anesthesia Procedure Notes (Addendum)
Anesthesia Procedure Image    

## 2022-11-19 NOTE — Progress Notes (Signed)
Block in effect for right arm Dressing dry Hand perfused on the right Plan for occupational and physical therapy tomorrow morning with discharged likely by mid afternoon

## 2022-11-19 NOTE — Anesthesia Postprocedure Evaluation (Signed)
Anesthesia Post Note  Patient: [name]  Procedure(s) Performed: REVERSE SHOULDER ARTHROPLASTY, LIPOMA REMOVAL (Right: Shoulder) BICEPS TENODESIS (Right)     Anesthesia Type: General Anesthetic complications: no   No notable events documented.  Last Vitals:  Vitals:   11/19/22 0618  BP: (!) 143/73  Pulse: 63  Resp: 16  Temp: 36.9 C  SpO2: 99%    Last Pain:  Vitals:   11/19/22 0634  TempSrc:   PainSc: 0-No pain                 Shary Decamp

## 2022-11-19 NOTE — Anesthesia Procedure Notes (Signed)
Anesthesia Regional Block: Interscalene brachial plexus block   Pre-Anesthetic Checklist: , timeout performed,  Correct Patient, Correct Site, Correct Laterality,  Correct Procedure, Correct Position, site marked,  Risks and benefits discussed,  Surgical consent,  Pre-op evaluation,  At surgeon's request and post-op pain management  Laterality: Right  Prep: chloraprep       Needles:  Injection technique: Single-shot  Needle Type: Echogenic Stimulator Needle     Needle Length: 9cm      Additional Needles:   Procedures:,,,, ultrasound used (permanent image in chart),,     Nerve Stimulator or Paresthesia:  Response: 0.45 mA  Additional Responses:   Narrative:  Start time: 11/19/2022 7:00 AM End time: 11/19/2022 7:10 AM Injection made incrementally with aspirations every 5 mL.  Performed by: Personally  Anesthesiologist: Eilene Ghazi, MD  Additional Notes: Patient tolerated the procedure well without complications

## 2022-11-19 NOTE — H&P (Signed)
[name] is an 67 y.o. female.   Chief Complaint: Right shoulder pain and lipoma HPI: [name] is a 67 y.o. female who presents  reporting right shoulder pain.  She states that she injured her shoulder while working.  Initial fall occurred in November 2022.  Reported some shoulder pain at that time.  Had a subsequent fall at work in August 2023 and subsequent workup there demonstrated that her injury occurred earlier which caused rotator cuff pathology to progress.  She describes both weakness and pain.  She describes popping.  She has a husband son and 4 grandkids at home.  No personal or family history of DVT or pulmonary embolism no cardiac history.  She does have diabetes.  She has had an injection into the shoulder which helped her for 4 weeks.  This was over 3 months ago.  She works as a Child psychotherapist.  In general pain is a bigger problem than weakness.  She also describes having a lipoma both in the anterior aspect of the shoulder as well as posterior aspect of the shoulder near the posterior axillary crease.  MRI scan is reviewed.  Shows infraspinatus and supraspinatus tearing with retraction to the glenoid rim with atrophy.  Also has some subscapularis tearing as well as complete tear of the intra-articular portion of the long head of the biceps but clinically she does not describe a Popeye deformity..  Patient also had a recent MRI scan of the lipoma which is reviewed by me.  No concerning characteristics for malignancy.                                                                      Past Medical History:  Diagnosis Date   Anemia 2006   required transfusion post TAH/BSO 05/2005   Arthritis    Asthma    Chronic cough    Colon polyps    hyperplastic   Diabetes mellitus without complication (HCC)    Diverticulosis    GERD (gastroesophageal reflux disease)    on prilosec   GLA deficiency (HCC)    Glaucoma of both eyes    Hypertension    Joint pain    Osteoarthritis     Pancreatitis    Shortness of breath dyspnea    Stroke [facility])    TIA 2022   Vitamin D deficiency     Past Surgical History:  Procedure Laterality Date   BREAST EXCISIONAL BIOPSY Right 1990   COLONOSCOPY N/A 08/22/2013   Procedure: COLONOSCOPY;  Surgeon: Hilarie Fredrickson, MD;  Location: [facility] ENDOSCOPY;  Service: Endoscopy;  Laterality: N/A;   ESOPHAGOGASTRODUODENOSCOPY N/A 08/22/2013   Procedure: ESOPHAGOGASTRODUODENOSCOPY (EGD);  Surgeon: Hilarie Fredrickson, MD;  Location: [facility] ENDOSCOPY;  Service: Endoscopy;  Laterality: N/A;   ESOPHAGOGASTRODUODENOSCOPY (EGD) WITH PROPOFOL N/A 08/19/2015   Procedure: ESOPHAGOGASTRODUODENOSCOPY (EGD) WITH PROPOFOL;  Surgeon: Hilarie Fredrickson, MD;  Location: [facility] ENDOSCOPY;  Service: Endoscopy;  Laterality: N/A;   HYSTEROSCOPY WITH D & C  01/2005   for uterine fibroids.    INSERTION OF MESH N/A 08/24/2013   Procedure: INSERTION OF MESH;  Surgeon: Mariella Saa, MD;  Location: [facility] OR;  Service: General;  Laterality: N/A;   LIPOMA EXCISION Left 06/21/2015   Procedure:  EXCISION OF LEFT SCALP LIPOMA;  Surgeon: Luretha Murphy, MD;  Location: [facility];  Service: General;  Laterality: Left;   PANNICULECTOMY N/A 08/24/2013   Procedure: PANNICULECTOMY;  Surgeon: Mariella Saa, MD;  Location: [facility] OR;  Service: General;  Laterality: N/A;   TOTAL ABDOMINAL HYSTERECTOMY W/ BILATERAL SALPINGOOPHORECTOMY  05/2005   TOTAL HIP ARTHROPLASTY Right 12/12/2013   Procedure: RIGHT TOTAL HIP ARTHROPLASTY ANTERIOR APPROACH;  Surgeon: Kathryne Hitch, MD;  Location: [facility] OR;  Service: Orthopedics;  Laterality: Right;   VENTRAL HERNIA REPAIR N/A 08/24/2013   Procedure: HERNIA REPAIR VENTRAL ADULT;  Surgeon: Mariella Saa, MD;  Location: [facility] OR;  Service: General;  Laterality: N/A;    Family History  Problem Relation Age of Onset   Emphysema Mother        smoked   Diabetes Mother    Hypertension Mother    Colon cancer Father        7-s   Hypertension Father     Heart disease Father    High Cholesterol Father    Breast cancer Sister    Social History:  reports that she quit smoking about 44 years ago. Her smoking use included cigarettes. She has a 3.75 pack-year smoking history. She has never used smokeless tobacco. She reports that she does not drink alcohol and does not use drugs.  Allergies:  Allergies  Allergen Reactions   Naproxen Hives    "hives, I had to call 911"   Diclofenac Hives   Penicillins Hives and Other (See Comments)    Has patient had a PCN reaction causing immediate rash, facial/tongue/throat swelling, SOB or lightheadedness with hypotension: Yes  Has patient had a PCN reaction causing severe rash involving mucus membranes or skin necrosis: Yes  Has patient had a PCN reaction that required hospitalization No  Has patient had a PCN reaction occurring within the last 10 years: No.  If all of the above answers are "NO", then may proceed with Cephalosporin use.    Medications Prior to Admission  Medication Sig Dispense Refill   atorvastatin (LIPITOR) 20 MG tablet Take 1 tablet (20 mg total) by mouth daily at 6 PM. 30 tablet 2   budesonide-formoterol (SYMBICORT) 160-4.5 MCG/ACT inhaler INHALE 2 PUFFS INTO THE LUNGS IN THE MORNING AND AT BEDTIME (Patient taking differently: Inhale 2 puffs into the lungs 2 (two) times daily as needed (shortness of breath).) 10.2 g 11   cetirizine (ZYRTEC ALLERGY) 10 MG tablet Take 1 tablet (10 mg total) by mouth daily. 30 tablet 2   FARXIGA 10 MG TABS tablet Take 10 mg by mouth daily.     metFORMIN (GLUCOPHAGE) 1000 MG tablet Take 1,000 mg by mouth 2 (two) times daily with a meal.     montelukast (SINGULAIR) 10 MG tablet TAKE 1 TABLET(10 MG) BY MOUTH AT BEDTIME 30 tablet 11   omeprazole (PRILOSEC) 20 MG capsule Take 1 capsule (20 mg total) by mouth in the morning and at bedtime. (Patient taking differently: Take 20 mg by mouth daily.) 60 capsule 2   pioglitazone (ACTOS) 15 MG tablet Take 15  mg by mouth daily.     sitaGLIPtin (JANUVIA) 50 MG tablet Take 50 mg by mouth daily.     telmisartan (MICARDIS) 40 MG tablet Take 40 mg by mouth daily.     Tezepelumab-ekko (TEZSPIRE) 210 MG/1. SOAJ INJECT 1 PEN UNDER THE SKIN EVERY 4 WEEKS 1.91 mL 5   Travoprost, BAK Free, (TRAVATAN) 0.004 % SOLN ophthalmic solution  Place 1 drop into both eyes at bedtime.     albuterol (PROVENTIL) (2.5 MG/3ML) 0.083% nebulizer solution Take 3 mLs (2.5 mg total) by nebulization every 6 (six) hours as needed for wheezing or shortness of breath. 75 mL 0   aspirin EC 81 MG EC tablet Take 1 tablet (81 mg total) by mouth daily. (Patient not taking: Reported on 11/09/2022)     chlorpheniramine (CHLOR-TRIMETON) 4 MG tablet Take 1 tablet (4 mg total) by mouth every 6 (six) hours as needed for allergies. (Patient not taking: Reported on 11/09/2022) 30 tablet 0   EPINEPHrine 0.3 mg/0.3 mL IJ SOAJ injection Inject 0.3 mg into the muscle as needed for anaphylaxis. (Patient not taking: Reported on 11/09/2022) 1 each 2   fluticasone (FLONASE) 50 MCG/ACT nasal spray Place 1 spray into both nostrils daily. (Patient not taking: Reported on 11/09/2022) 15 mL 2   glucose blood test strip Use as instructed 100 each 1   HYDROcodone-acetaminophen (NORCO/VICODIN) 5-325 MG tablet Take 1 tablet by mouth every 4 (four) hours as needed for moderate pain. (Patient not taking: Reported on 11/09/2022) 20 tablet 0   MELATONIN PO Take 1 tablet by mouth at bedtime as needed (sleep).     methylPREDNISolone (MEDROL DOSEPAK) 4 MG TBPK tablet Take as directed with food (Patient not taking: Reported on 11/09/2022) 21 tablet 0   tiZANidine (ZANAFLEX) 4 MG tablet Take 1 tablet (4 mg total) by mouth 3 (three) times daily. (Patient not taking: Reported on 11/09/2022) 30 tablet 0    Results for orders placed or performed during the hospital encounter of 11/19/22 (from the past 48 hour(s))  Glucose, capillary     Status: None   Collection Time: 11/19/22  6:18  AM  Result Value Ref Range   Glucose-Capillary 81 70 - 99 mg/dL    Comment: Glucose reference range applies only to samples taken after fasting for at least 8 hours.   No results found.  Review of Systems  Musculoskeletal:  Positive for arthralgias.  All other systems reviewed and are negative.   Blood pressure (!) 143/73, pulse 63, temperature 98.4 F (36.9 C), temperature source Oral, resp. rate 16, height 5\' 2"  (1.575 m), weight 73.5 kg, SpO2 99 %. Physical Exam Vitals reviewed.  HENT:     Nose: Nose normal.     Mouth/Throat:     Mouth: Mucous membranes are moist.  Eyes:     Pupils: Pupils are equal, round, and reactive to light.  Cardiovascular:     Rate and Rhythm: Normal rate.     Pulses: Normal pulses.  Pulmonary:     Effort: Pulmonary effort is normal.  Abdominal:     General: Abdomen is flat.  Musculoskeletal:     Cervical back: Normal range of motion.  Skin:    General: Skin is warm.     Capillary Refill: Capillary refill takes less than 2 seconds.  Neurological:     General: No focal deficit present.     Mental Status: She is alert.  Psychiatric:        Mood and Affect: Mood normal.    Ortho exam demonstrates forward flexion and abduction both above 90 degrees. She does have weakness at 5- out of 5 to subscap testing and 4+ out of 5 to external rotation strength testing. Coarseness and popping is present but no Popeye deformity is noted in the proximal humeral region. Deltoid fires. Motor or sensory function to the hand is intact. Cervical spine range of motion  is intact.  Patient has a 4 to 5 cm mobile lipoma in the quadrilateral space region of that right shoulder along with a smaller anterior lipoma.  Neither of these are tender to palpation and there is no surrounding lymphadenopathy.  Assessment/Plan  Impression is right shoulder pain from rotator cuff arthropathy.  Patient has pain as well as arthropathy and some weakness.  She also has a lipoma below the  axillary region of the  shoulder.  And a second lipoma in the anterior aspect of the shoulder.  They both are  nontender and freely mobile.  We discussed today the risk and benefits of reverse shoulder replacement.  They include but not limited to infection or vessel damage incomplete resolution of pain as well as incomplete functional restoration.  Patient understands but wishes to proceed.  She will also like to have both lipomas excised at the time of surgery.  The anterior lipoma should not be a problem.  On that lipoma that is closer to her posterior axilla we would potentially need to use an arm holder for that or put her in the lateral position after her surgery.  That is something we can better assess once we have her in the [place] positioner.  Patient understands the risk and benefits as well as the expected rehabilitative time course.  All questions answered   Burnard Bunting, MD 11/19/2022, 6:34 AM

## 2022-11-19 NOTE — Transfer of Care (Signed)
Immediate Anesthesia Transfer of Care Note  Patient: [name]  Procedure(s) Performed: REVERSE SHOULDER ARTHROPLASTY, LIPOMA REMOVAL (Right: Shoulder) BICEPS TENODESIS (Right)  Patient Location: PACU  Anesthesia Type:GA combined with regional for post-op pain  Level of Consciousness: awake, alert , oriented, and patient cooperative  Airway & Oxygen Therapy: Patient Spontanous Breathing and Patient connected to nasal cannula oxygen  Post-op Assessment: Report given to RN and Post -op Vital signs reviewed and stable  Post vital signs: Reviewed and stable  Last Vitals:  Vitals Value Taken Time  BP 100/56 11/19/22 1201  Temp    Pulse 77 11/19/22 1205  Resp 14 11/19/22 1205  SpO2 98 % 11/19/22 1205  Vitals shown include unvalidated device data.  Last Pain:  Vitals:   11/19/22 0634  TempSrc:   PainSc: 0-No pain         Complications: No notable events documented.

## 2022-11-19 NOTE — Anesthesia Procedure Notes (Signed)
Procedure Name: Intubation Date/Time: 11/19/2022 7:48 AM  Performed by: Shary Decamp, CRNAPre-anesthesia Checklist: Patient identified, Patient being monitored, Timeout performed, Emergency Drugs available and Suction available Patient Re-evaluated:Patient Re-evaluated prior to induction Oxygen Delivery Method: Circle System Utilized Preoxygenation: Pre-oxygenation with 100% oxygen Induction Type: IV induction Ventilation: Mask ventilation without difficulty Laryngoscope Size: Miller and 2 Grade View: Grade I Tube type: Oral Tube size: 7.0 mm Number of attempts: 1 Airway Equipment and Method: Stylet Placement Confirmation: ETT inserted through vocal cords under direct vision, positive ETCO2 and breath sounds checked- equal and bilateral Secured at: 21 cm Tube secured with: Tape Dental Injury: Teeth and Oropharynx as per pre-operative assessment

## 2022-11-19 NOTE — Anesthesia Preprocedure Evaluation (Signed)
Anesthesia Evaluation  Patient identified by MRN, date of birth, ID band Patient awake    Reviewed: Allergy & Precautions, H&P , NPO status , Patient's Chart, lab work & pertinent test results  Airway Mallampati: II  TM Distance: >3 FB Neck ROM: Full    Dental no notable dental hx.    Pulmonary asthma , former smoker   Pulmonary exam normal breath sounds clear to auscultation       Cardiovascular hypertension, Pt. on medications Normal cardiovascular exam Rhythm:Regular Rate:Normal     Neuro/Psych TIA negative psych ROS   GI/Hepatic Neg liver ROS,GERD  Medicated,,  Endo/Other  diabetes    Renal/GU negative Renal ROS  negative genitourinary   Musculoskeletal negative musculoskeletal ROS (+)    Abdominal   Peds negative pediatric ROS (+)  Hematology negative hematology ROS (+)   Anesthesia Other Findings   Reproductive/Obstetrics negative OB ROS                             Anesthesia Physical Anesthesia Plan  ASA: 3  Anesthesia Plan: General   Post-op Pain Management: Regional block*   Induction: Intravenous  PONV Risk Score and Plan: 3 and Ondansetron, Dexamethasone and Treatment may vary due to age or medical condition  Airway Management Planned: Oral ETT  Additional Equipment:   Intra-op Plan:   Post-operative Plan: Extubation in OR  Informed Consent: I have reviewed the patients History and Physical, chart, labs and discussed the procedure including the risks, benefits and alternatives for the proposed anesthesia with the patient or authorized representative who has indicated his/her understanding and acceptance.     Dental advisory given  Plan Discussed with: CRNA and Surgeon  Anesthesia Plan Comments:        Anesthesia Quick Evaluation

## 2022-11-19 NOTE — Brief Op Note (Signed)
   11/19/2022  11:37 AM  PATIENT:  [name]  67 y.o. female  PRE-OPERATIVE DIAGNOSIS:  right shoulder rotator cuff arthropathy, lipoma x2, biceps tendonitis  POST-OPERATIVE DIAGNOSIS:  right shoulder rotator cuff arthropathy, lipoma x2,biceps tendonitis  PROCEDURE:  Procedure(s): REVERSE SHOULDER ARTHROPLASTY, LIPOMA REMOVAL x 2 BICEPS TENODESIS  SURGEON:  Surgeon(s): August Saucer, Corrie Mckusick, MD  ASSISTANT: magnant pa  ANESTHESIA:   general  EBL: 75 ml    Total I/O In: 340 [IV Piggyback:340] Out: -   BLOOD ADMINISTERED: none  DRAINS: none   LOCAL MEDICATIONS USED:  marcaine mso4 clonidine vanco SPECIMEN:  lipoma x 2  COUNTS:  YES  TOURNIQUET:  * No tourniquets in log *  DICTATION: .Other Dictation: Dictation Number [id number]  PLAN OF CARE: Admit for overnight observation  PATIENT DISPOSITION:  PACU - hemodynamically stable

## 2022-11-20 DIAGNOSIS — M12811 Other specific arthropathies, not elsewhere classified, right shoulder: Secondary | ICD-10-CM | POA: Diagnosis not present

## 2022-11-20 LAB — SURGICAL PATHOLOGY

## 2022-11-20 MED ORDER — SODIUM CHLORIDE 0.9 % IV SOLN: Freq: Once | INTRAVENOUS | Status: DC

## 2022-11-20 MED ORDER — SODIUM CHLORIDE 0.9 % IV BOLUS: 500.0000 mL | Freq: Once | INTRAVENOUS | Status: DC

## 2022-11-20 MED ORDER — SODIUM CHLORIDE 0.9 % IV BOLUS
500.0000 mL | Freq: Once | INTRAVENOUS | Status: AC
  Administered 2022-11-20: 500 mL via INTRAVENOUS

## 2022-11-20 NOTE — Progress Notes (Signed)
  Subjective: [name] is a 67 y.o. female s/p right RSA.  They are POD1.  Pt's pain is controlled.  Patient denies any complaints of chest pain, shortness of breath, abdominal pain. Block still in effect. Having some hypotension since procedure. Had bolus ordered overnight.  Little bit of dizziness.    Objective: Vital signs in last 24 hours: Temp:  [97.5 F (36.4 C)-98.2 F (36.8 C)] 97.8 F (36.6 C) (05/31 0005) Pulse Rate:  [57-81] 59 (05/31 0400) Resp:  [14-20] 20 (05/31 0400) BP: (84-100)/(48-63) 92/50 (05/31 0518) SpO2:  [91 %-100 %] 98 % (05/31 0400)  Intake/Output from previous day: 05/30 0701 - 05/31 0700 In: 1340 [I.V.:1000; IV Piggyback:340] Out: 1475 [Urine:1400; Blood:75] Intake/Output this shift: No intake/output data recorded.  Exam:  No gross blood or drainage overlying the dressing 2+ radial pulse of the operative extremity Postoperative physical exam somewhat limited by interscalene block   Labs: No results for input(s): "HGB" in the last 72 hours. No results for input(s): "WBC", "RBC", "HCT", "PLT" in the last 72 hours. No results for input(s): "NA", "K", "CL", "CO2", "BUN", "CREATININE", "GLUCOSE", "CALCIUM" in the last 72 hours. No results for input(s): "LABPT", "INR" in the last 72 hours.  Assessment/Plan: Pt is POD1 s/p right RSA    -Plan to discharge to home today or tomorrow pending patient's blood pressure and ability to ambulate without dizziness  -No lifting with the operative arm  -Follow-up with Dr. August Saucer in clinic 2 weeks postoperatively     [facility] 11/20/2022, 7:57 AM

## 2022-11-20 NOTE — Plan of Care (Signed)

## 2022-11-20 NOTE — Progress Notes (Signed)
PT Cancellation Note  Patient Details Name: [name] MRN: [id number] DOB: February 28, 1956   Cancelled Treatment:    Reason Eval/Treat Not Completed: Other (comment).  With another staff member, retry at another time.   Ivar Drape 11/20/2022, 11:50 AM  Samul Dada, PT PhD Acute Rehab Dept. Number: [facility] [id number] and [facility] [phone]

## 2022-11-20 NOTE — Progress Notes (Signed)
Ortho care Dr. Magnus Ivan called bp 85/50 manual

## 2022-11-20 NOTE — Progress Notes (Signed)
Physical Therapy Evaluation Patient Details Name: [name] MRN: [id number] DOB: [birth year] Today's Date: 11/20/2022  History of Present Illness  67 y.o. female who presents 5/30  reporting right shoulder pain. Received R reverse total shoulder and lipoma removal 5/30.  PMH postive for CVA, DM, arthritis, glaucoma, GERD  Clinical Impression  Pt was seen for progression of gait from bed to stand and in room to hall. Her tolerance is good, has minor drops to high 50's on diastolic BP's and noted post gait was 118/56 (78) with no symptoms.  Nursing is in to observe and was able to administer pain meds for R shoulder during the session.  Pt is struggling with discomfort, able to get her ice replaced earlier for pain management prior to PT arrival again, and this has reduced pain a bit.  Follow along with her to work on sequence of movement for protecting shoulder, to understand how to balance with RUE restrained and encourage her to be up in chair as tolerated for BP and endurance.  Follow for goals of PT as listed below.       Recommendations for follow up therapy are one component of a multi-disciplinary discharge planning process, led by the attending physician.  Recommendations may be updated based on patient status, additional functional criteria and insurance authorization.  Follow Up Recommendations       Assistance Recommended at Discharge Intermittent Supervision/Assistance  Patient can return home with the following  A little help with walking and/or transfers;A little help with bathing/dressing/bathroom;Assistance with cooking/housework;Help with stairs or ramp for entrance    Equipment Recommendations None recommended by PT  Recommendations for Other Services       Functional Status Assessment Patient has had a recent decline in their functional status and demonstrates the ability to make significant improvements in function in a reasonable and predictable amount of time.      Precautions / Restrictions Precautions Precautions: Shoulder Type of Shoulder Precautions: reverse total shoulder Shoulder Interventions: Shoulder sling/immobilizer;Off for dressing/bathing/exercises;At all times Precaution Booklet Issued: Yes (comment) Required Braces or Orthoses: Sling Restrictions Weight Bearing Restrictions: Yes RUE Weight Bearing: Non weight bearing Other Position/Activity Restrictions: has polar care in place      Mobility  Bed Mobility Overal bed mobility: Needs Assistance Bed Mobility: Sit to Supine, Supine to Sit     Supine to sit: Min guard Sit to supine: Min guard   General bed mobility comments: pt got up and back on L side of bed with good effort, maintained her polar care pad in place and reconnected immediately after walk    Transfers Overall transfer level: Needs assistance Equipment used: None Transfers: Sit to/from Stand, Bed to chair/wheelchair/BSC Sit to Stand: Supervision   Step pivot transfers: Supervision       General transfer comment: reminders for L UE placemetn    Ambulation/Gait Ambulation/Gait assistance: Min guard Gait Distance (Feet): 60 Feet Assistive device: 1 person hand held assist Gait Pattern/deviations: Step-through pattern, Decreased stride length, Wide base of support Gait velocity: reduced Gait velocity interpretation: <1.31 ft/sec, indicative of household ambulator Pre-gait activities: standing balance ck and BP General Gait Details: pt makes a small adjustment on gait to stabilize and steady, able to walk without LOB  Stairs            Wheelchair Mobility    Modified Rankin (Stroke Patients Only)       Balance     Sitting balance-Leahy Scale: Good       Standing  balance-Leahy Scale: Fair                               Pertinent Vitals/Pain Pain Assessment Pain Assessment: Faces Faces Pain Scale: Hurts even more Pain Location: right shoulder Pain Descriptors /  Indicators: Guarding, Discomfort Pain Intervention(s): Limited activity within patient's tolerance, Monitored during session, Premedicated before session, Repositioned, Ice applied    Home Living Family/patient expects to be discharged to:: Private residence Living Arrangements: Spouse/significant other;Children;Other relatives Available Help at Discharge: Family;Available 24 hours/day Type of Home: House Home Access: Stairs to enter Entrance Stairs-Rails: Doctor, general practice of Steps: 2   Home Layout: One level Home Equipment: Agricultural consultant (2 wheels);Cane - single point;Shower seat;Other (comment) Additional Comments: walking with no AD in room and hallway    Prior Function Prior Level of Function : Independent/Modified Independent             Mobility Comments: no AD needed       Hand Dominance   Dominant Hand: Right    Extremity/Trunk Assessment   Upper Extremity Assessment Upper Extremity Assessment: Defer to OT evaluation    Lower Extremity Assessment Lower Extremity Assessment: Overall WFL for tasks assessed    Cervical / Trunk Assessment Cervical / Trunk Assessment: Normal  Communication   Communication: No difficulties  Cognition Arousal/Alertness: Awake/alert Behavior During Therapy: WFL for tasks assessed/performed Overall Cognitive Status: Within Functional Limits for tasks assessed                                 General Comments: pt is a very motivated Chief Executive Officer to try to initiate all mobility        General Comments General comments (skin integrity, edema, etc.): Pt was seen for mobility and noted some small drops in diastolic BP earlier, and post gait today was 118/56 (78)    Exercises     Assessment/Plan    PT Assessment Patient needs continued PT services  PT Problem List Decreased activity tolerance;Cardiopulmonary status limiting activity;Pain;Decreased skin integrity       PT Treatment Interventions  DME instruction;Gait training;Stair training;Functional mobility training;Therapeutic activities;Therapeutic exercise;Balance training;Neuromuscular re-education;Patient/family education    PT Goals (Current goals can be found in the Care Plan section)  Acute Rehab PT Goals Patient Stated Goal: to get her pain and BP managed PT Goal Formulation: With patient Time For Goal Achievement: 11/27/22 Potential to Achieve Goals: Good    Frequency Min 3X/week     Co-evaluation               AM-PAC PT "6 Clicks" Mobility  Outcome Measure Help needed turning from your back to your side while in a flat bed without using bedrails?: A Little Help needed moving from lying on your back to sitting on the side of a flat bed without using bedrails?: A Little Help needed moving to and from a bed to a chair (including a wheelchair)?: A Little Help needed standing up from a chair using your arms (e.g., wheelchair or bedside chair)?: A Little Help needed to walk in hospital room?: A Little Help needed climbing 3-5 steps with a railing? : A Little 6 Click Score: 18    End of Session Equipment Utilized During Treatment: Gait belt Activity Tolerance: Treatment limited secondary to medical complications (Comment) (BP fluctuations) Patient left: in bed;with call bell/phone within reach;with bed alarm set Nurse  Communication: Mobility status PT Visit Diagnosis: Other abnormalities of gait and mobility (R26.89);Pain Pain - Right/Left: Right Pain - part of body: Shoulder    Time: 1412-1440 (+)1235 to 1243 PT Time Calculation (min) (ACUTE ONLY): 28 min   Charges:   PT Evaluation $PT Eval Moderate Complexity: 1 Mod PT Treatments $Therapeutic Activity: 8-22 mins       Ivar Drape 11/20/2022, 5:21 PM  Samul Dada, PT PhD Acute Rehab Dept. Number: [facility] [id number] and [facility] [phone]

## 2022-11-20 NOTE — Progress Notes (Signed)
Blood pressure 85/50 manual Ortho care Dr. Magnus Ivan called and new orders received will recheck bp after bolus

## 2022-11-20 NOTE — Evaluation (Signed)
Occupational Therapy Evaluation Patient Details Name: [name] MRN: [id number] DOB: 1956/01/31 Today's Date: 11/20/2022   History of Present Illness [name] is a 67 y.o. female who presents  reporting right shoulder pain. Underwent right shoulder rotator cuff repair via Dr. August Saucer on 5/30.  PMH postive for CVA, DM, arthritis, glaucoma, GERD   Clinical Impression   Pt currently at min to mod assist for selfcare tasks following shoulder precautions with supervision for functional transfers without use of an assistive device.  BP taken during session at 88/53 supine with HOB elevated above 35 degrees, 100/60 in sitting, and 108/65 in standing.  Pt with report of some dizziness and slight pain in the RUE with completion of Pendulums.  She reports having 24 hour assist at home from family and feel she will benefit from acute care OT at this time to help with education on precautions, exercises, and techniques for B/D.  Recommend follow-up OT post acute per MD recommendation.    Recommendations for follow up therapy are one component of a multi-disciplinary discharge planning process, led by the attending physician.  Recommendations may be updated based on patient status, additional functional criteria and insurance authorization.   Assistance Recommended at Discharge PRN  Patient can return home with the following A little help with bathing/dressing/bathroom;Assistance with cooking/housework;Assist for transportation;Help with stairs or ramp for entrance    Functional Status Assessment  Patient has had a recent decline in their functional status and demonstrates the ability to make significant improvements in function in a reasonable and predictable amount of time.  Equipment Recommendations  BSC/3in1       Precautions / Restrictions Precautions Precautions: Shoulder Shoulder Interventions: Shoulder sling/immobilizer;Off for dressing/bathing/exercises;At all times Required Braces or  Orthoses: Sling Restrictions Weight Bearing Restrictions: Yes RUE Weight Bearing: Non weight bearing      Mobility Bed Mobility Overal bed mobility: Needs Assistance Bed Mobility: Supine to Sit     Supine to sit: Min guard     General bed mobility comments: transfer to the left side of the bed    Transfers Overall transfer level: Needs assistance Equipment used: None Transfers: Sit to/from Stand, Bed to chair/wheelchair/BSC Sit to Stand: Modified independent (Device/Increase time), Supervision     Step pivot transfers: Supervision     General transfer comment: Close supervision for ambulation to the counter for completion of pendulum exercises      Balance Overall balance assessment: Mild deficits observed, not formally tested, Needs assistance   Sitting balance-Leahy Scale: Good     Standing balance support: No upper extremity supported Standing balance-Leahy Scale: Fair                             ADL either performed or assessed with clinical judgement   ADL Overall ADL's : Needs assistance/impaired Eating/Feeding: Set up;Sitting   Grooming: Wash/dry hands;Wash/dry face;Standing;Supervision/safety Grooming Details (indicate cue type and reason): simulated Upper Body Bathing: Minimal assistance;Sitting Upper Body Bathing Details (indicate cue type and reason): simulated Lower Body Bathing: Minimal assistance;Sit to/from stand Lower Body Bathing Details (indicate cue type and reason): simulated Upper Body Dressing : Moderate assistance;Standing Upper Body Dressing Details (indicate cue type and reason): to donn gown as a robe Lower Body Dressing: Minimal assistance;Sit to/from stand   Toilet Transfer: Ambulation;Min guard;Comfort height toilet Toilet Transfer Details (indicate cue type and reason): simulated Toileting- Clothing Manipulation and Hygiene: Minimal assistance;Sit to/from stand Toileting - Architect Details (indicate  cue  type and reason): simulated Tub/ Shower Transfer: Tub transfer;Ambulation;Shower seat   Functional mobility during ADLs: Supervision/safety General ADL Comments: Educated pt on ADLs with sling including techniques for dressing, bathing, and donning doffing sling.  Also, provided education with return demonstration on PROM shoulder external rotation exercises for the right shoulder and pendulum exercises with handout.  Will need review.  Pt's BP was as follows: 88/53 in supine HOB up to 35 degrees, 100/60 in sitting, and 108/65 in standing.     Vision Baseline Vision/History: 3 Glaucoma Ability to See in Adequate Light: 1 Impaired Patient Visual Report: No change from baseline Vision Assessment?: No apparent visual deficits     Perception  WFL   Praxis  [facility]    Pertinent Vitals/Pain Pain Assessment Pain Assessment: Faces Faces Pain Scale: Hurts little more Pain Location: right shoulder Pain Descriptors / Indicators: Discomfort Pain Intervention(s): Limited activity within patient's tolerance, Monitored during session, Repositioned, Ice applied     Hand Dominance Right   Extremity/Trunk Assessment Upper Extremity Assessment Upper Extremity Assessment: RUE deficits/detail RUE Deficits / Details: right shoulder in sling, able to demonstrate AROM at elbow, wrist, and digits.  Pendulums and PROM external rotation completed 0-30 degrees RUE Sensation: decreased light touch (numbness in digits)   Lower Extremity Assessment Lower Extremity Assessment: Defer to PT evaluation   Cervical / Trunk Assessment Cervical / Trunk Assessment: Normal   Communication Communication Communication: No difficulties   Cognition Arousal/Alertness: Awake/alert Behavior During Therapy: WFL for tasks assessed/performed Overall Cognitive Status: Within Functional Limits for tasks assessed                                                  Home Living Family/patient expects to be  discharged to:: Private residence Living Arrangements: Spouse/significant other;Children;Other relatives Available Help at Discharge: Family;Available 24 hours/day Type of Home: House Home Access: Stairs to enter   Entrance Stairs-Rails: Right;Left Home Layout: One level     Bathroom Shower/Tub: Chief Strategy Officer: Standard Bathroom Accessibility: Yes   Home Equipment: Agricultural consultant (2 wheels);Cane - single point;Shower seat;Other (comment) (bed that the head raises up and down)          Prior Functioning/Environment Prior Level of Function : Independent/Modified Independent                        OT Problem List: Decreased strength;Decreased range of motion;Impaired balance (sitting and/or standing);Pain;Decreased knowledge of use of DME or AE      OT Treatment/Interventions: Self-care/ADL training;Patient/family education;Therapeutic exercise;DME and/or AE instruction;Balance training;Therapeutic activities    OT Goals(Current goals can be found in the care plan section) Acute Rehab OT Goals Patient Stated Goal: Pt wants her shoulder pain to get better. OT Goal Formulation: With patient Time For Goal Achievement: 11/27/22 Potential to Achieve Goals: Good  OT Frequency: Min 2X/week       AM-PAC OT "6 Clicks" Daily Activity     Outcome Measure Help from another person eating meals?: A Little Help from another person taking care of personal grooming?: A Little Help from another person toileting, which includes using toliet, bedpan, or urinal?: A Little Help from another person bathing (including washing, rinsing, drying)?: A Little Help from another person to put on and taking off regular upper body clothing?: A Lot Help from  another person to put on and taking off regular lower body clothing?: A Little 6 Click Score: 17   End of Session Equipment Utilized During Treatment: Other (comment) (sling on RUE) Nurse Communication: Mobility  status  Activity Tolerance: Patient tolerated treatment well Patient left: in chair;with call bell/phone within reach;with chair alarm set  OT Visit Diagnosis: Unsteadiness on feet (R26.81);Muscle weakness (generalized) (M62.81);Pain Pain - Right/Left: Right Pain - part of body: Shoulder                Time: 1610-9604 OT Time Calculation (min): 61 min Charges:  OT General Charges $OT Visit: 1 Visit OT Evaluation $OT Eval Moderate Complexity: 1 Mod OT Treatments $Self Care/Home Management : 38-52 mins Perrin Maltese, OTR/L Acute Rehabilitation Services  Office [phone] 11/20/2022

## 2022-11-20 NOTE — Addendum Note (Signed)
Addendum  created 11/20/22 1430 by Eilene Ghazi, MD   Clinical Note Signed, Intraprocedure Blocks edited

## 2022-11-20 NOTE — Progress Notes (Signed)
No new order continue to monitor.

## 2022-11-21 ENCOUNTER — Encounter: Payer: Self-pay | Admitting: Orthopedic Surgery

## 2022-11-21 DIAGNOSIS — M12811 Other specific arthropathies, not elsewhere classified, right shoulder: Secondary | ICD-10-CM | POA: Diagnosis not present

## 2022-11-21 MED ORDER — OXYCODONE-ACETAMINOPHEN 5-325 MG PO TABS: 1.0000 | ORAL_TABLET | Freq: Four times a day (QID) | ORAL | 0 refills | Status: DC | PRN

## 2022-11-21 MED ORDER — METHOCARBAMOL 500 MG PO TABS: 500.0000 mg | ORAL_TABLET | Freq: Four times a day (QID) | ORAL | 0 refills | Status: DC | PRN

## 2022-11-21 NOTE — Progress Notes (Signed)
Occupational Therapy Treatment Patient Details Name: [name] MRN: [id number] DOB: Aug 04, 1955 Today's Date: 11/21/2022   History of present illness 67 y.o. female who presents 5/30  reporting right shoulder pain. Received R reverse total shoulder and lipoma removal 5/30.  PMH postive for CVA, DM, arthritis, glaucoma, GERD   OT comments  Pt continuing to progress in OT sessions, educated pt on hemi dressing techniques and pt was able to successfully don shirt using first in last out technique. Reinforced AROM of uninvolved joints and continued shoulder AAROM/PROM exercises (see below). Pt BP still fluctuating with diastolic dropping upon standing, pt noted to have numbness in R thumb and PA was informed. Dizziness causing minor LOB but pt able to self recover. Also educated pt on donning/doffing sling. OT to continued to progress pt as able, DC plans remain appropriate at this time to follow MD recommendations.   Recommendations for follow up therapy are one component of a multi-disciplinary discharge planning process, led by the attending physician.  Recommendations may be updated based on patient status, additional functional criteria and insurance authorization.    Assistance Recommended at Discharge PRN  Patient can return home with the following  A little help with bathing/dressing/bathroom;Assistance with cooking/housework;Assist for transportation;Help with stairs or ramp for entrance   Equipment Recommendations  BSC/3in1    Recommendations for Other Services      Precautions / Restrictions Precautions Precautions: Shoulder Type of Shoulder Precautions: reverse total shoulder Shoulder Interventions: Shoulder sling/immobilizer;Off for dressing/bathing/exercises;At all times Precaution Booklet Issued: Yes (comment) Required Braces or Orthoses: Sling Restrictions Weight Bearing Restrictions: Yes RUE Weight Bearing: Non weight bearing       Mobility Bed Mobility                General bed mobility comments: pt rec'd and left in recliner    Transfers     Transfers: Sit to/from Stand Sit to Stand: Supervision                 Balance Overall balance assessment: Mild deficits observed, not formally tested, Needs assistance         Standing balance support: No upper extremity supported Standing balance-Leahy Scale: Fair                             ADL either performed or assessed with clinical judgement   ADL                   Upper Body Dressing : Set up;Sitting Upper Body Dressing Details (indicate cue type and reason): Min A to button bra, educated pt on hemi dressing techniques. Pt donned shirt with set up assist and min cues for new learning Lower Body Dressing: Supervision/safety;Set up;Sitting/lateral leans Lower Body Dressing Details (indicate cue type and reason): Doffed/donned socks using one handed strategy             Functional mobility during ADLs: Supervision/safety General ADL Comments: Focused most of session on AAROM/PROM exercises and education to complete bADLs/iADLs    Extremity/Trunk Assessment Upper Extremity Assessment RUE Deficits / Details: Pt continues to report numbness in R thumb beginning at IP joint and distally            Vision       Perception     Praxis      Cognition Arousal/Alertness: Awake/alert Behavior During Therapy: WFL for tasks assessed/performed Overall Cognitive Status: Within Functional Limits for tasks assessed  General Comments: Pt nervous to engage RUE in functional tasks due to fear of pain, reassured patient they can incorporate RUE functionally with tasks but is limited due to restrictions        Exercises General Exercises - Upper Extremity Elbow Flexion: AROM, 5 reps, Right Elbow Extension: AROM, Right, 5 reps Wrist Flexion: AROM, 5 reps, Right Wrist Extension: AROM, 5 reps, Right Shoulder  Exercises Pendulum Exercise: AROM, 5 reps, Right (in each direction) Shoulder External Rotation: AAROM, Right (within 0-30 degrees AAROM ONLY) Other Exercises Other Exercises: lap slides RUE x5 reps    Shoulder Instructions       General Comments Pt still c/o of dizziness when standing. BP 99/74 taken seated in recliner at end of session, 112/69 following standing for 1 minute    Pertinent Vitals/ Pain       Pain Assessment Pain Assessment: Faces Faces Pain Scale: Hurts little more Pain Location: right shoulder Pain Descriptors / Indicators: Guarding, Discomfort Pain Intervention(s): Monitored during session, Repositioned, RN gave pain meds during session  Home Living                                          Prior Functioning/Environment              Frequency  Min 2X/week        Progress Toward Goals  OT Goals(current goals can now be found in the care plan section)  Progress towards OT goals: Progressing toward goals  Acute Rehab OT Goals Patient Stated Goal: Pt wants her shoulder pain to get better. OT Goal Formulation: With patient Time For Goal Achievement: 11/27/22 Potential to Achieve Goals: Good  Plan Discharge plan remains appropriate;Frequency remains appropriate    Co-evaluation                 AM-PAC OT "6 Clicks" Daily Activity     Outcome Measure   Help from another person eating meals?: A Little Help from another person taking care of personal grooming?: A Little Help from another person toileting, which includes using toliet, bedpan, or urinal?: A Little Help from another person bathing (including washing, rinsing, drying)?: A Little Help from another person to put on and taking off regular upper body clothing?: A Little Help from another person to put on and taking off regular lower body clothing?: A Little 6 Click Score: 18    End of Session Equipment Utilized During Treatment: Other (comment) (RUE sling)  OT  Visit Diagnosis: Unsteadiness on feet (R26.81);Muscle weakness (generalized) (M62.81);Pain Pain - Right/Left: Right Pain - part of body: Shoulder   Activity Tolerance Patient tolerated treatment well   Patient Left in chair;with call bell/phone within reach;with chair alarm set   Nurse Communication Mobility status        Time: 4098-1191 OT Time Calculation (min): 24 min  Charges: OT General Charges $OT Visit: 1 Visit OT Treatments $Self Care/Home Management : 8-22 mins $Therapeutic Exercise: 8-22 mins  11/21/2022  AB, OTR/L  Acute Rehabilitation Services  Office: [phone]   Tristan Schroeder 11/21/2022, 10:21 AM

## 2022-11-21 NOTE — Progress Notes (Signed)
Patient ID: [name], female   DOB: [birth year], 67 y.o.   MRN: [id number]   Subjective: 2 Days Post-Op Procedure(s) (LRB): REVERSE SHOULDER ARTHROPLASTY, LIPOMA REMOVAL (Right) BICEPS TENODESIS (Right) Patient reports pain as moderate.  Watching TV  " pain worse than I expected "  Objective: Vital signs in last 24 hours: Temp:  [98.6 F (37 C)-99 F (37.2 C)] 98.6 F (37 C) (06/01 0806) Pulse Rate:  [63-83] 83 (06/01 0806) Resp:  [16-18] 18 (06/01 0806) BP: (101-117)/(59-70) 109/69 (06/01 0806) SpO2:  [96 %-100 %] 98 % (06/01 0806)  Intake/Output from previous day: 05/31 0701 - 06/01 0700 In: 480 [P.O.:480] Out: -  Intake/Output this shift: No intake/output data recorded.  No results for input(s): "HGB" in the last 72 hours. No results for input(s): "WBC", "RBC", "HCT", "PLT" in the last 72 hours. No results for input(s): "NA", "K", "CL", "CO2", "BUN", "CREATININE", "GLUCOSE", "CALCIUM" in the last 72 hours. No results for input(s): "LABPT", "INR" in the last 72 hours.  Neurologically intact No results found.  Assessment/Plan: 2 Days Post-Op Procedure(s) (LRB): REVERSE SHOULDER ARTHROPLASTY, LIPOMA REMOVAL (Right) BICEPS TENODESIS (Right) Plan: ambulate and if BP stable discharge home.   Eldred Manges 11/21/2022, 8:30 AM

## 2022-11-21 NOTE — Discharge Instructions (Addendum)
No lifting with the operative arm.  Use the CPM machine 3 times per day for 1 hour each time.  Increase the range of motion with each session if you have a model that performs this function.  Otherwise just keep the same range of motion.  You may shower and the dressing is waterproof.  This dressing will stay on until your follow-up visit with Dr. August Saucer or Drema Pry in 2 weeks.  No submersion underwater such as in a bath, pool, hot tub.  Use the ice machine to help with pain control as well but do not use it for any longer than about 45 minutes at a time.  Call the office at [phone] with any questions or concerns or send [name] a message on MyChart.

## 2022-11-21 NOTE — Progress Notes (Signed)
  Subjective: [name] is a 67 y.o. female s/p right RSA.  They are POD 2.  Pt's pain is moderate but controlled.  Patient denies any complaints of chest pain, shortness of breath, abdominal pain.  Block is worn off.  She states that she has very small amount of dizziness only when she first sits up in bed but aside from that it is much improved compared with yesterday.  She was ambulatory yesterday with physical therapy with no significant drop in blood pressure or any symptoms reproduced as far as lightheadedness or dizziness.  Objective: Vital signs in last 24 hours: Temp:  [98.6 F (37 C)-99 F (37.2 C)] 98.6 F (37 C) (06/01 0806) Pulse Rate:  [63-83] 83 (06/01 0806) Resp:  [16-18] 18 (06/01 0806) BP: (101-117)/(59-70) 109/69 (06/01 0806) SpO2:  [96 %-100 %] 98 % (06/01 0851)  Intake/Output from previous day: 05/31 0701 - 06/01 0700 In: 480 [P.O.:480] Out: -  Intake/Output this shift: No intake/output data recorded.  Exam:  No gross blood or drainage overlying the dressing 2+ radial pulse of the operative extremity Intact EPL, FPL, finger abduction, grip strength, wrist extension, pronation/supination, bicep, tricep.  Axillary nerve is intact with deltoid firing.   Labs: No results for input(s): "HGB" in the last 72 hours. No results for input(s): "WBC", "RBC", "HCT", "PLT" in the last 72 hours. No results for input(s): "NA", "K", "CL", "CO2", "BUN", "CREATININE", "GLUCOSE", "CALCIUM" in the last 72 hours. No results for input(s): "LABPT", "INR" in the last 72 hours.  Assessment/Plan: Pt is POD 2 s/p right RSA    -Plan to discharge to home today pending patient's ability to ambulate with physical therapy without drop in blood pressure or hypotension symptoms  -No lifting with the operative arm  -Follow-up with Dr. August Saucer in clinic 2 weeks postoperatively     [facility] 11/21/2022, 9:09 AM

## 2022-11-22 ENCOUNTER — Encounter (HOSPITAL_COMMUNITY): Payer: Self-pay | Admitting: Orthopedic Surgery

## 2022-11-22 DIAGNOSIS — D1721 Benign lipomatous neoplasm of skin and subcutaneous tissue of right arm: Secondary | ICD-10-CM

## 2022-11-22 DIAGNOSIS — M19011 Primary osteoarthritis, right shoulder: Secondary | ICD-10-CM

## 2022-11-22 DIAGNOSIS — M7521 Bicipital tendinitis, right shoulder: Secondary | ICD-10-CM

## 2022-11-23 NOTE — Telephone Encounter (Signed)
Called and spoke with patient. It is improving

## 2022-12-04 ENCOUNTER — Other Ambulatory Visit (INDEPENDENT_AMBULATORY_CARE_PROVIDER_SITE_OTHER): Payer: BC Managed Care – PPO

## 2022-12-04 ENCOUNTER — Ambulatory Visit (INDEPENDENT_AMBULATORY_CARE_PROVIDER_SITE_OTHER): Payer: BC Managed Care – PPO | Admitting: Surgical

## 2022-12-04 DIAGNOSIS — Z96611 Presence of right artificial shoulder joint: Secondary | ICD-10-CM | POA: Diagnosis not present

## 2022-12-06 ENCOUNTER — Other Ambulatory Visit: Payer: Self-pay | Admitting: Surgical

## 2022-12-06 ENCOUNTER — Encounter: Payer: Self-pay | Admitting: Surgical

## 2022-12-06 DIAGNOSIS — Z96611 Presence of right artificial shoulder joint: Secondary | ICD-10-CM

## 2022-12-06 MED ORDER — OXYCODONE-ACETAMINOPHEN 5-325 MG PO TABS: 1.0000 | ORAL_TABLET | Freq: Three times a day (TID) | ORAL | 0 refills | Status: DC | PRN

## 2022-12-06 NOTE — Progress Notes (Signed)
Post-Op Visit Note   Patient: [name]           Date of Birth: June 21, 1956           MRN: [id number] Visit Date: 12/04/2022 PCP: Tally Joe, MD   Assessment & Plan:  Chief Complaint:  Chief Complaint  Patient presents with   Right Shoulder - Routine Post Op   Visit Diagnoses:  1. S/P reverse total shoulder arthroplasty, right     Plan: [name] is a 67 y.o. female who presents s/p right reverse shoulder arthroplasty and lipoma removal on 12/04/2022.  Patient is doing well and pain is overall controlled.  Up to 90 degrees on CPM machine.  Denies any chest pain, SOB, fevers, chills.  No complaint of any instability symptoms.  Taking pain medication mostly at night.  Have some numbness and tingling in her fingers.  Feels that when she lets her arm out of the sling she has radiation of pain down into the hand all the way down the arm.  No neck pain..    On exam, patient has range of motion 20 degrees X rotation, 70 degrees abduction, 100 degrees forward elevation.  Intact EPL, FPL, finger abduction, finger adduction, pronation/supination, bicep, tricep, deltoid of operative extremity.  Axillary nerve intact with deltoid firing.  Incision is healing well without evidence of infection or dehiscence.  Incision was made sure to be covered with Steri-Strips from the proximal to distal aspect of the length of the incision.  2+ radial pulse of the operative extremity  Plan is discontinue sling.  Okay to very lightly lift with the operative extremity but no lifting anything heavier than a coffee cup or cell phone.  Start physical therapy to focus on passive range of motion and active range of motion with deltoid isometrics.  Do not want to externally rotate past 30 degrees to protect subscapularis repair.  Follow-up in 4 weeks for clinical recheck.    Follow-Up Instructions: No follow-ups on file.   Orders:  Orders Placed This Encounter  Procedures   XR Shoulder Right   No  orders of the defined types were placed in this encounter.   Imaging: No results found.  PMFS History: Patient Active Problem List   Diagnosis Date Noted   Arthritis of right shoulder region 11/22/2022   Biceps tendonitis on right 11/22/2022   Lipoma of right shoulder 11/22/2022   S/P reverse total shoulder arthroplasty, right 11/19/2022   Poorly controlled persistent asthma 02/27/2022   Pain in right shoulder 02/09/2022   Asthma exacerbation 09/19/2020   Anemia 10/13/2019   Glaucoma 10/13/2019   Hypertensive disorder 10/13/2019   Uterine leiomyoma 10/13/2019   Vitamin D insufficiency 04/26/2019   TIA (transient ischemic attack) 03/30/2019   Hyperlipidemia 03/30/2019   Thyroid nodule 03/30/2019   Stroke-like episode s/p IV tPA 03/28/2019   GERD (gastroesophageal reflux disease) 06/19/2018   Constipation 06/19/2018   Other fatigue 08/02/2017   Shortness of breath on exertion 08/02/2017   Mass of axilla 07/07/2017   Abdominal pain, epigastric    Abnormal CT of the abdomen    Pancreatitis, acute    Acute pancreatitis 08/13/2015   UTI (lower urinary tract infection) 08/13/2015   Severe obesity (BMI >= 40) (HCC) 01/23/2015   Sinusitis, chronic 01/08/2015   Cough variant asthma 01/03/2015   Arthritis of right hip 12/12/2013   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   Benign neoplasm of colon 08/22/2013   Special screening for malignant  neoplasms, colon 08/22/2013   Abdominal pain 08/19/2013   Diabetes mellitus without complication (HCC) 08/19/2013   Essential hypertension, benign 08/19/2013   Ventral hernia 08/19/2013   Past Medical History:  Diagnosis Date   Anemia 2006   required transfusion post TAH/BSO 05/2005   Arthritis    Asthma    Chronic cough    Colon polyps    hyperplastic   Diabetes mellitus without complication (HCC)    Diverticulosis    GERD (gastroesophageal reflux disease)    on prilosec   GLA deficiency (HCC)    Glaucoma of both eyes     Hypertension    Joint pain    Osteoarthritis    Pancreatitis    Shortness of breath dyspnea    Stroke [facility])    TIA 2022   Vitamin D deficiency     Family History  Problem Relation Age of Onset   Emphysema Mother        smoked   Diabetes Mother    Hypertension Mother    Colon cancer Father        7-s   Hypertension Father    Heart disease Father    High Cholesterol Father    Breast cancer Sister     Past Surgical History:  Procedure Laterality Date   BICEPT TENODESIS Right 11/19/2022   Procedure: BICEPS TENODESIS;  Surgeon: Cammy Copa, MD;  Location: [facility] OR;  Service: Orthopedics;  Laterality: Right;   BREAST EXCISIONAL BIOPSY Right 1990   COLONOSCOPY N/A 08/22/2013   Procedure: COLONOSCOPY;  Surgeon: Hilarie Fredrickson, MD;  Location: [facility] ENDOSCOPY;  Service: Endoscopy;  Laterality: N/A;   ESOPHAGOGASTRODUODENOSCOPY N/A 08/22/2013   Procedure: ESOPHAGOGASTRODUODENOSCOPY (EGD);  Surgeon: Hilarie Fredrickson, MD;  Location: [facility] ENDOSCOPY;  Service: Endoscopy;  Laterality: N/A;   ESOPHAGOGASTRODUODENOSCOPY (EGD) WITH PROPOFOL N/A 08/19/2015   Procedure: ESOPHAGOGASTRODUODENOSCOPY (EGD) WITH PROPOFOL;  Surgeon: Hilarie Fredrickson, MD;  Location: [facility] ENDOSCOPY;  Service: Endoscopy;  Laterality: N/A;   HYSTEROSCOPY WITH D & C  01/2005   for uterine fibroids.    INSERTION OF MESH N/A 08/24/2013   Procedure: INSERTION OF MESH;  Surgeon: Mariella Saa, MD;  Location: [facility] OR;  Service: General;  Laterality: N/A;   LIPOMA EXCISION Left 06/21/2015   Procedure: EXCISION OF LEFT SCALP LIPOMA;  Surgeon: Luretha Murphy, MD;  Location: [facility];  Service: General;  Laterality: Left;   PANNICULECTOMY N/A 08/24/2013   Procedure: PANNICULECTOMY;  Surgeon: Mariella Saa, MD;  Location: [facility] OR;  Service: General;  Laterality: N/A;   REVERSE SHOULDER ARTHROPLASTY Right 11/19/2022   Procedure: REVERSE SHOULDER ARTHROPLASTY, LIPOMA REMOVAL;  Surgeon: Cammy Copa, MD;  Location:  [facility] OR;  Service: Orthopedics;  Laterality: Right;   TOTAL ABDOMINAL HYSTERECTOMY W/ BILATERAL SALPINGOOPHORECTOMY  05/2005   TOTAL HIP ARTHROPLASTY Right 12/12/2013   Procedure: RIGHT TOTAL HIP ARTHROPLASTY ANTERIOR APPROACH;  Surgeon: Kathryne Hitch, MD;  Location: [facility] OR;  Service: Orthopedics;  Laterality: Right;   VENTRAL HERNIA REPAIR N/A 08/24/2013   Procedure: HERNIA REPAIR VENTRAL ADULT;  Surgeon: Mariella Saa, MD;  Location: [facility] OR;  Service: General;  Laterality: N/A;   Social History   Occupational History   Occupation: Social Worker  Tobacco Use   Smoking status: Former    Packs/day: 0.25    Years: 15.00    Additional pack years: 0.00    Total pack years: 3.75    Types: Cigarettes    Quit date: 06/22/1978  Years since quitting: 44.4   Smokeless tobacco: Never  Vaping Use   Vaping Use: Never used  Substance and Sexual Activity   Alcohol use: No    Alcohol/week: 0.0 standard drinks of alcohol   Drug use: No   Sexual activity: Yes

## 2022-12-08 ENCOUNTER — Other Ambulatory Visit: Payer: Self-pay

## 2022-12-08 DIAGNOSIS — Z96611 Presence of right artificial shoulder joint: Secondary | ICD-10-CM

## 2022-12-14 NOTE — Discharge Summary (Signed)
Physician Discharge Summary      Patient ID: [name] MRN: [id number] DOB/AGE: 66-Feb-1957 66 y.o.  Admit date: 11/19/2022 Discharge date: 11/21/2022  Admission Diagnoses:  Principal Problem:   S/P reverse total shoulder arthroplasty, right Active Problems:   Arthritis of right shoulder region   Biceps tendonitis on right   Lipoma of right shoulder   Discharge Diagnoses:  Same  Surgeries: Procedure(s): REVERSE SHOULDER ARTHROPLASTY, LIPOMA REMOVAL BICEPS TENODESIS on 11/19/2022   Consultants:   Discharged Condition: Stable  Hospital Course: [name] is an 67 y.o. female who was admitted 11/19/2022 with a chief complaint of right shoulder pain, and found to have a diagnosis of right shoulder osteoarthritis and right shoulder lipoma.  They were brought to the operating room on 11/19/2022 and underwent the above named procedures.  Pt awoke from anesthesia without complication and was transferred to the floor. On POD1, patient's pain was overall controlled.  She had some symptomatic hypotension on POD 1 with some dizziness and lightheadedness with ambulation.  No other complaints aside from shoulder pain.  No red flag signs or symptoms.  This improved by POD 2 and she was able to perform well with physical and Occupational Therapy.  Discharge home on POD 2..  Pt will f/u with Dr. August Saucer in clinic in ~2 weeks.   Antibiotics given:  Anti-infectives (From admission, onward)    Start     Dose/Rate Route Frequency Ordered Stop   11/19/22 2200  ceFAZolin (ANCEF) IVPB 1 g/50 mL premix        1 g 100 mL/hr over 30 Minutes Intravenous Every 8 hours 11/19/22 1913 11/20/22 1830   11/19/22 1107  vancomycin (VANCOCIN) powder  Status:  Discontinued          As needed 11/19/22 1107 11/19/22 1157   11/19/22 0600  vancomycin (VANCOCIN) IVPB 1000 mg/200 mL premix        1,000 mg 200 mL/hr over 60 Minutes Intravenous On call to O.R. 11/19/22 4098 11/19/22 1191     .  Recent vital  signs:  Vitals:   11/21/22 0806 11/21/22 0851  BP: 109/69   Pulse: 83   Resp: 18   Temp: 98.6 F (37 C)   SpO2: 98% 98%    Recent laboratory studies:  Results for orders placed or performed during the hospital encounter of 11/19/22  Glucose, capillary  Result Value Ref Range   Glucose-Capillary 81 70 - 99 mg/dL  Glucose, capillary  Result Value Ref Range   Glucose-Capillary 116 (H) 70 - 99 mg/dL   Comment 1 Notify RN    Comment 2 Document in Chart   Glucose, capillary  Result Value Ref Range   Glucose-Capillary 111 (H) 70 - 99 mg/dL  Surgical pathology  Result Value Ref Range   SURGICAL PATHOLOGY      SURGICAL PATHOLOGY CASE: [id number] PATIENT: Gurpreet Syler Surgical Pathology Report     Clinical History: right shoulder rotator cuff arthropathy, lipoma X 2 (cm)     FINAL MICROSCOPIC DIAGNOSIS:  A. SOFT TISSUE, RIGHT SHOULDER, EXCISION: -  Mature adipose tissue consistent with lipoma  B. SOFT TISSUE, RIGHT AXILLARY, EXCISION: -  Mature adipose tissue consistent with lipoma    GROSS DESCRIPTION:  Specimen A: Received fresh is a 6.4 x 2.5 x 0.8 cm soft, partially disrupted fatty tissue which has homogeneous yellow-pink lobulated cut surfaces.  There are no areas of hemorrhage or necrosis.  Representative sections are submitted 1 block.  Specimen B: Received fresh  are several irregular to well-defined pieces of soft fatty tissue, 7.2 x 6.6 x 2.8 cm in aggregate.  The cut surfaces are yellow-pink, soft, lobulated.  There are no areas of hemorrhage or necrosis.  Representative sections are submitted in 1 block.  SW 11/19/2022   Final Diagnosis per formed by Orene Desanctis DO.   Electronically signed 11/20/2022 Technical component performed at [employer]. [facility], [address] [place], [place] [address].  Professional component performed at [facility], [address], [place], [place] [address].  Immunohistochemistry  Technical component (if applicable) was performed at [facility]. [address], [place], [place] [address].   IMMUNOHISTOCHEMISTRY DISCLAIMER (if applicable): Some of these immunohistochemical stains may have been developed and the performance characteristics determine by [facility]. Some may not have been cleared or approved by the U.S. Food and Drug Administration. The FDA has determined that such clearance or approval is not necessary. This test is used for clinical purposes. It should not be regarded as investigational or for research. This laboratory is certified under the Clinical Laboratory Improvement Amendments of 1988  (CLIA-88) as qualified to perform high complexity clinical laboratory testing.  The controls stained appropriately.   IHC stains are performed on formalin fixed, paraffin embedded tissue using a 3,3"diaminobenzidine (DAB) chromogen and Leica Bond Autostainer System. The staining intensity of the nucleus is score manually and is reported as the percentage of tumor cell nuclei demonstrating specific nuclear staining. The specimens are fixed in 10% Neutral Formalin for at least 6 hours and up to 72hrs. These tests are validated on decalcified tissue. Results should be interpreted with caution given the possibility of false negative results on decalcified specimens. Antibody Clones are as follows ER-clone 24F, PR-clone 16, Ki67- clone MM1. Some of these immunohistochemical stains may have been developed and the performance characteristics determined by [facility].     Discharge Medications:   Allergies as of 11/21/2022       Reactions   Naproxen Hives   "hives, I had to call 911"   Diclofenac Hives   Penicillins Hives, Other (See Comments)   Has patient had a PCN reaction causing immediate rash, facial/tongue/throat swelling, SOB or lightheadedness with hypotension: Yes Has patient had a PCN reaction causing  severe rash involving mucus membranes or skin necrosis: Yes Has patient had a PCN reaction that required hospitalization No Has patient had a PCN reaction occurring within the last 10 years: No. If all of the above answers are "NO", then may proceed with Cephalosporin use.        Medication List     STOP taking these medications    HYDROcodone-acetaminophen 5-325 MG tablet Commonly known as: NORCO/VICODIN       TAKE these medications    albuterol (2.5 MG/3ML) 0.083% nebulizer solution Commonly known as: PROVENTIL Take 3 mLs (2.5 mg total) by nebulization every 6 (six) hours as needed for wheezing or shortness of breath.   aspirin EC 81 MG tablet Take 1 tablet (81 mg total) by mouth daily.   atorvastatin 20 MG tablet Commonly known as: LIPITOR Take 1 tablet (20 mg total) by mouth daily at 6 PM.   budesonide-formoterol 160-4.5 MCG/ACT inhaler Commonly known as: SYMBICORT INHALE 2 PUFFS INTO THE LUNGS IN THE MORNING AND AT BEDTIME What changed:  when to take this reasons to take this   cetirizine 10 MG tablet Commonly known as: ZyrTEC Allergy Take 1 tablet (10 mg total) by mouth  daily.   chlorpheniramine 4 MG tablet Commonly known as: CHLOR-TRIMETON Take 1 tablet (4 mg total) by mouth every 6 (six) hours as needed for allergies.   EPINEPHrine 0.3 mg/0.3 mL Soaj injection Commonly known as: EPI-PEN Inject 0.3 mg into the muscle as needed for anaphylaxis.   Farxiga 10 MG Tabs tablet Generic drug: dapagliflozin propanediol Take 10 mg by mouth daily.   fluticasone 50 MCG/ACT nasal spray Commonly known as: FLONASE Place 1 spray into both nostrils daily.   glucose blood test strip Use as instructed   MELATONIN PO Take 1 tablet by mouth at bedtime as needed (sleep).   metFORMIN 1000 MG tablet Commonly known as: GLUCOPHAGE Take 1,000 mg by mouth 2 (two) times daily with a meal.   methocarbamol 500 MG tablet Commonly known as: ROBAXIN Take 1 tablet (500 mg  total) by mouth every 6 (six) hours as needed for muscle spasms.   methylPREDNISolone 4 MG Tbpk tablet Commonly known as: MEDROL DOSEPAK Take as directed with food   montelukast 10 MG tablet Commonly known as: SINGULAIR TAKE 1 TABLET(10 MG) BY MOUTH AT BEDTIME   omeprazole 20 MG capsule Commonly known as: PRILOSEC Take 1 capsule (20 mg total) by mouth in the morning and at bedtime. What changed: when to take this   pioglitazone 15 MG tablet Commonly known as: ACTOS Take 15 mg by mouth daily.   sitaGLIPtin 50 MG tablet Commonly known as: JANUVIA Take 50 mg by mouth daily.   telmisartan 40 MG tablet Commonly known as: MICARDIS Take 40 mg by mouth daily.   Tezspire 210 MG/1. Soaj Generic drug: Tezepelumab-ekko INJECT 1 PEN UNDER THE SKIN EVERY 4 WEEKS   tiZANidine 4 MG tablet Commonly known as: Zanaflex Take 1 tablet (4 mg total) by mouth 3 (three) times daily.   Travoprost (BAK Free) 0.004 % Soln ophthalmic solution Commonly known as: TRAVATAN Place 1 drop into both eyes at bedtime.        Diagnostic Studies: XR Shoulder Right  Result Date: 12/06/2022 AP, scapular Y, axillary views of the shoulder reviewed.  Reverse shoulder arthroplasty prosthesis in good position and alignment without any complicating features.  There is no evidence of periprosthetic fracture, dislocation, dissociation of the glenosphere.   DG Shoulder Right Port  Result Date: 11/19/2022 CLINICAL DATA:  Status post shoulder surgery. EXAM: RIGHT SHOULDER - 1 VIEW COMPARISON:  CT 08/03/2022 FINDINGS: Reverse right shoulder arthroplasty in expected alignment. No periprosthetic lucency or fracture. Recent postsurgical change includes air and edema in the soft tissues. IMPRESSION: Reverse right shoulder arthroplasty in expected alignment. No immediate postoperative complication. Electronically Signed   By: Narda Rutherford M.D.   On: 11/19/2022 13:06    Disposition: Discharge disposition: 01-Home or  Self Care          Follow-up Information     Cammy Copa, MD Follow up in 1 week(s).   Specialty: Orthopedic Surgery Contact information: [address] [place] [address] [phone]                  Signed: Karenann Cai 12/14/2022, 9:03 AM

## 2022-12-14 NOTE — Therapy (Signed)
OUTPATIENT PHYSICAL THERAPY EVALUATION   Patient Name: [name] MRN: [id number] DOB:1956-01-03, 67 y.o., female Today's Date: 12/15/2022  END OF SESSION:  PT End of Session - 12/15/22 1300     Visit Number 1    Number of Visits 20    Date for PT Re-Evaluation 02/23/23    Authorization Type [employer] [id number]    PT Start Time 1304    PT Stop Time 1335    PT Time Calculation (min) 31 min    Activity Tolerance Patient limited by pain    Behavior During Therapy [facility] for tasks assessed/performed             Past Medical History:  Diagnosis Date   Anemia 2006   required transfusion post TAH/BSO 05/2005   Arthritis    Asthma    Chronic cough    Colon polyps    hyperplastic   Diabetes mellitus without complication (HCC)    Diverticulosis    GERD (gastroesophageal reflux disease)    on prilosec   GLA deficiency (HCC)    Glaucoma of both eyes    Hypertension    Joint pain    Osteoarthritis    Pancreatitis    Shortness of breath dyspnea    Stroke [facility])    TIA 2022   Vitamin D deficiency    Past Surgical History:  Procedure Laterality Date   BICEPT TENODESIS Right 11/19/2022   Procedure: BICEPS TENODESIS;  Surgeon: Cammy Copa, MD;  Location: [facility] OR;  Service: Orthopedics;  Laterality: Right;   BREAST EXCISIONAL BIOPSY Right 1990   COLONOSCOPY N/A 08/22/2013   Procedure: COLONOSCOPY;  Surgeon: Hilarie Fredrickson, MD;  Location: [facility] ENDOSCOPY;  Service: Endoscopy;  Laterality: N/A;   ESOPHAGOGASTRODUODENOSCOPY N/A 08/22/2013   Procedure: ESOPHAGOGASTRODUODENOSCOPY (EGD);  Surgeon: Hilarie Fredrickson, MD;  Location: [facility] ENDOSCOPY;  Service: Endoscopy;  Laterality: N/A;   ESOPHAGOGASTRODUODENOSCOPY (EGD) WITH PROPOFOL N/A 08/19/2015   Procedure: ESOPHAGOGASTRODUODENOSCOPY (EGD) WITH PROPOFOL;  Surgeon: Hilarie Fredrickson, MD;  Location: [facility] ENDOSCOPY;  Service: Endoscopy;  Laterality: N/A;   HYSTEROSCOPY WITH D & C  01/2005   for uterine fibroids.    INSERTION OF MESH N/A 08/24/2013    Procedure: INSERTION OF MESH;  Surgeon: Mariella Saa, MD;  Location: [facility] OR;  Service: General;  Laterality: N/A;   LIPOMA EXCISION Left 06/21/2015   Procedure: EXCISION OF LEFT SCALP LIPOMA;  Surgeon: Luretha Murphy, MD;  Location: [facility];  Service: General;  Laterality: Left;   PANNICULECTOMY N/A 08/24/2013   Procedure: PANNICULECTOMY;  Surgeon: Mariella Saa, MD;  Location: [facility] OR;  Service: General;  Laterality: N/A;   REVERSE SHOULDER ARTHROPLASTY Right 11/19/2022   Procedure: REVERSE SHOULDER ARTHROPLASTY, LIPOMA REMOVAL;  Surgeon: Cammy Copa, MD;  Location: [facility] OR;  Service: Orthopedics;  Laterality: Right;   TOTAL ABDOMINAL HYSTERECTOMY W/ BILATERAL SALPINGOOPHORECTOMY  05/2005   TOTAL HIP ARTHROPLASTY Right 12/12/2013   Procedure: RIGHT TOTAL HIP ARTHROPLASTY ANTERIOR APPROACH;  Surgeon: Kathryne Hitch, MD;  Location: [facility] OR;  Service: Orthopedics;  Laterality: Right;   VENTRAL HERNIA REPAIR N/A 08/24/2013   Procedure: HERNIA REPAIR VENTRAL ADULT;  Surgeon: Mariella Saa, MD;  Location: [facility] OR;  Service: General;  Laterality: N/A;   Patient Active Problem List   Diagnosis Date Noted   Arthritis of right shoulder region 11/22/2022   Biceps tendonitis on right 11/22/2022   Lipoma of right shoulder 11/22/2022   S/P reverse total shoulder arthroplasty, right 11/19/2022  Poorly controlled persistent asthma 02/27/2022   Pain in right shoulder 02/09/2022   Asthma exacerbation 09/19/2020   Anemia 10/13/2019   Glaucoma 10/13/2019   Hypertensive disorder 10/13/2019   Uterine leiomyoma 10/13/2019   Vitamin D insufficiency 04/26/2019   TIA (transient ischemic attack) 03/30/2019   Hyperlipidemia 03/30/2019   Thyroid nodule 03/30/2019   Stroke-like episode s/p IV tPA 03/28/2019   GERD (gastroesophageal reflux disease) 06/19/2018   Constipation 06/19/2018   Other fatigue 08/02/2017   Shortness of breath on exertion 08/02/2017   Mass of  axilla 07/07/2017   Abdominal pain, epigastric    Abnormal CT of the abdomen    Pancreatitis, acute    Acute pancreatitis 08/13/2015   UTI (lower urinary tract infection) 08/13/2015   Severe obesity (BMI >= 40) (HCC) 01/23/2015   Sinusitis, chronic 01/08/2015   Cough variant asthma 01/03/2015   Arthritis of right hip 12/12/2013   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   Benign neoplasm of colon 08/22/2013   Special screening for malignant neoplasms, colon 08/22/2013   Abdominal pain 08/19/2013   Diabetes mellitus without complication (HCC) 08/19/2013   Essential hypertension, benign 08/19/2013   Ventral hernia 08/19/2013    PCP: Tally Joe MD  REFERRING PROVIDER: Julieanne Cotton, PA-C  REFERRING DIAG: [phone] (ICD-10-CM) - S/P reverse total shoulder arthroplasty, right  THERAPY DIAG:  Chronic right shoulder pain  Muscle weakness (generalized)  Stiffness of right shoulder, not elsewhere classified  Localized edema  Rationale for Evaluation and Treatment: Rehabilitation  ONSET DATE: Surgery 11/19/2022 Rt reverse TSA  SUBJECTIVE:                                                                                                                                                                                      SUBJECTIVE STATEMENT: Pt came to clinic s/p recent Rt reverse total shoulder on 11/19/2022.  Pt indicated having pain since surgery but not as severe as pre surgery.  Worse sling until last MD visit.  Pt indicated waking every night due to symptoms.  Sleeping in bed in elevation.  Driving but limited.   PERTINENT HISTORY: asthma, arthritis, DM, GERD HTN, glaucoma both eyes, abdominal hysterectomy, Rt THA  PAIN:  NPRS scale: at worst 8/10, at current 3/10 Pain location: Rt shoulder  Pain description: pressure, achy Aggravating factors: reaching, lifting, nighttime, writing/typing.  Relieving factors: muscle relaxer  PRECAUTIONS: Shoulder Rt reverse  TSA - limit IR strengthening, ER mobility in short term.   WEIGHT BEARING RESTRICTIONS:  Rt reverse TSA  FALLS:  Has patient fallen in last 6 months? No  LIVING ENVIRONMENT: Lives with: lives with their family Lives in: House/apartment  Stairs: 2 stairs to enter.   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   PLOF: Independent, Rt hand dominant, typing on computer, reports .  PATIENT GOALS:Reduce pain, use arm better, comb hair.   OBJECTIVE:   PATIENT SURVEYS:  12/15/2022 FOTO intake: 36    predicted:  59  COGNITION: 12/15/2022 Overall cognitive status: WFL     SENSATION: 12/15/2022 No specific testing today  POSTURE: 12/15/2022 Rounded shoulders noted, increased high guard on Rt arm in sitting.   UPPER EXTREMITY MMT:   12/15/2022: No formal testing for Rt shoulder due to recent surgery.  MMT Right 12/15/2022 Left 12/15/2022  Shoulder flexion  5/5  Shoulder extension    Shoulder abduction  4+/5  Shoulder adduction    Shoulder internal rotation  5/5  Shoulder external rotation  5/5  Elbow flexion  5/5  Elbow extension  5/5  Wrist flexion    Wrist extension    Wrist ulnar deviation    Wrist radial deviation    Wrist pronation    Wrist supination    (Blank rows = not tested)  UPPER EXTREMITY ROM:  12/15/2022:  Pain limited in all directions Rt shoulder.   ROM Right 12/15/2022 Left 12/15/2022  Shoulder flexion 115 AROM (120 PROM) In supine   Shoulder extension    Shoulder abduction 75 AROM (75 PROM) In supine   Shoulder adduction    Shoulder internal rotation 42 PROM in 20 deg abduction supine   Shoulder external rotation 30 PROM in 20 deg abduction supine   Middle trapezius    Lower trapezius    Elbow flexion    Elbow extension    Wrist flexion    Wrist extension    Wrist ulnar deviation    Wrist radial deviation    Wrist pronation    Wrist supination    Grip strength (lbs)    (Blank rows = not tested)  SHOULDER SPECIAL  TESTS: 12/15/2022: + shrug on Rt in elevation attempt   JOINT MOBILITY TESTING:  12/15/2022 No specific testing today  PALPATION:  12/15/2022 Tenderness around Rt shoulder incision                                                                                                                                                                                                  TODAY'S TREATMENT:  DATE:12/15/2022 Therex:    HEP instruction/performance c cues for techniques, handout provided.  Trial set performed of each for comprehension and symptom assessment.  See below for exercise list   PATIENT EDUCATION: 12/15/2022 Education details: HEP, POC Person educated: Patient Education method: Explanation, Demonstration, Verbal cues, and Handouts Education comprehension: verbalized understanding, returned demonstration, and verbal cues required  HOME EXERCISE PROGRAM: Access Code: [email or web address] URL: [email or web address] Date: 12/15/2022 Prepared by: Chyrel Masson  Exercises - Supine Shoulder Flexion Extension AAROM with Dowel  - 2-3 x daily - 7 x weekly - 1-2 sets - 10-15 reps - 3 hold - Supine Shoulder Protraction with Dowel (Mirrored)  - 2-3 x daily - 7 x weekly - 1-2 sets - 10-15 reps - 3 hold - Seated Scapular Retraction  - 3-5 x daily - 7 x weekly - 1 sets - 10 reps - 3-5 hold - Standing Flexion Extension Shoulder Pendulum Supported with Arm Bent  - 2-3 x daily - 7 x weekly - 1-2 sets - 10 reps - Standing Circular Shoulder Pendulum Supported with Arm Bent  - 2-3 x daily - 7 x weekly - 1-2 sets - 10 reps  ASSESSMENT:  CLINICAL IMPRESSION: Patient is a 67 y.o. who comes to clinic with complaints of Rt shoulder pain s/p recent Rt reverse shoulder replacement on 11/19/2022 with mobility, strength and movement coordination deficits that impair their ability to perform usual  daily and recreational functional activities without increase difficulty/symptoms at this time.  Patient to benefit from skilled PT services to address impairments and limitations to improve to previous level of function without restriction secondary to condition.   OBJECTIVE IMPAIRMENTS: decreased activity tolerance, decreased coordination, decreased endurance, decreased mobility, decreased ROM, decreased strength, increased edema, increased fascial restrictions, impaired perceived functional ability, impaired flexibility, impaired UE functional use, improper body mechanics, and pain.   ACTIVITY LIMITATIONS: carrying, lifting, sleeping, transfers, bed mobility, bathing, dressing, reach over head, and hygiene/grooming  PARTICIPATION LIMITATIONS: meal prep, cleaning, laundry, interpersonal relationship, driving, shopping, community activity, and occupation  PERSONAL FACTORS:  asthma, arthritis, DM, GERD HTN, glaucoma both eyes, abdominal hysterectomy, Rt THA  are also affecting patient's functional outcome.   REHAB POTENTIAL: Good  CLINICAL DECISION MAKING: Stable/uncomplicated  EVALUATION COMPLEXITY: Low   GOALS: Goals reviewed with patient? Yes  SHORT TERM GOALS: (target date for Short term goals are 3 weeks 01/05/2023)  1.Patient will demonstrate independent use of home exercise program to maintain progress from in clinic treatments. Goal status: New  LONG TERM GOALS: (target dates for all long term goals are 10 weeks  02/23/2023 )   1. Patient will demonstrate/report pain at worst less than or equal to 2/10 to facilitate minimal limitation in daily activity secondary to pain symptoms. Goal status: New   2. Patient will demonstrate independent use of home exercise program to facilitate ability to maintain/progress functional gains from skilled physical therapy services. Goal status: New   3. Patient will demonstrate FOTO outcome > or = 59 % to indicate reduced disability due to  condition. Goal status: New   4.  Patient will demonstrate Rt UE MMT > or = 4/5 throughout to facilitate lifting, reaching, carrying at PLOF in daily activity.   Goal status: New   5.  Patient will demonstrate Rt GH joint AROM WFL s symptoms to facilitate usual overhead reaching, self care, dressing at PLOF.    Goal status: New   6.  Patient will demonstrate/report return to work at [employer].  Goal status: New  PLAN:  PT FREQUENCY: 1-2x/week  PT DURATION: 10 weeks  PLANNED INTERVENTIONS: Therapeutic exercises, Therapeutic activity, Neuro Muscular re-education, Balance training, Gait training, Patient/Family education, Joint mobilization, Stair training, DME instructions, Dry Needling, Electrical stimulation, Traction, Cryotherapy, vasopneumatic device Moist heat, Taping, Ultrasound, Ionotophoresis 4mg /ml Dexamethasone, and aquatic therapy ,Manual therapy.  All included unless contraindicated  PLAN FOR NEXT SESSION: Review HEP knowledge/results.  PROM/AAROM gains.   Watch loaded IR strengthening, ER mobility past 30 in short term 4-6 weeks after surgery per usual reverse shoulder approach.    Chyrel Masson, PT, DPT, OCS, ATC 12/15/22  1:39 PM

## 2022-12-15 ENCOUNTER — Encounter: Payer: Self-pay | Admitting: Rehabilitative and Restorative Service Providers"

## 2022-12-15 ENCOUNTER — Other Ambulatory Visit: Payer: Self-pay

## 2022-12-15 ENCOUNTER — Ambulatory Visit (INDEPENDENT_AMBULATORY_CARE_PROVIDER_SITE_OTHER): Payer: BC Managed Care – PPO | Admitting: Rehabilitative and Restorative Service Providers"

## 2022-12-15 DIAGNOSIS — M25511 Pain in right shoulder: Secondary | ICD-10-CM

## 2022-12-15 DIAGNOSIS — R600 Localized edema: Secondary | ICD-10-CM

## 2022-12-15 DIAGNOSIS — M6281 Muscle weakness (generalized): Secondary | ICD-10-CM | POA: Diagnosis not present

## 2022-12-15 DIAGNOSIS — M25611 Stiffness of right shoulder, not elsewhere classified: Secondary | ICD-10-CM | POA: Diagnosis not present

## 2022-12-15 DIAGNOSIS — G8929 Other chronic pain: Secondary | ICD-10-CM

## 2022-12-17 ENCOUNTER — Other Ambulatory Visit: Payer: Self-pay | Admitting: Orthopaedic Surgery

## 2022-12-18 ENCOUNTER — Other Ambulatory Visit: Payer: Self-pay | Admitting: Orthopedic Surgery

## 2022-12-18 MED ORDER — OXYCODONE-ACETAMINOPHEN 5-325 MG PO TABS: 1.0000 | ORAL_TABLET | Freq: Three times a day (TID) | ORAL | 0 refills | Status: DC | PRN

## 2022-12-18 NOTE — Telephone Encounter (Signed)
From: Amanda Pea To: Office of [place], [place] Sent: 12/17/2022 5:54 PM EDT Subject: Medication Renewal Request  Refills have been requested for the following medications:   oxyCODONE-acetaminophen (PERCOCET) 5-325 MG tablet [[name]]  Patient Comment: [facility] sent a authorization form to this office last week concerning this medication.  Preferred pharmacy: [id number] - [place], [place] - [address] AT [address] Delivery method: Pickup   Medication renewals requested in this message routed separately:   methocarbamol (ROBAXIN) 500 MG tablet [Mark C Yates]

## 2022-12-19 ENCOUNTER — Other Ambulatory Visit: Payer: Self-pay | Admitting: Orthopedic Surgery

## 2022-12-21 ENCOUNTER — Ambulatory Visit (INDEPENDENT_AMBULATORY_CARE_PROVIDER_SITE_OTHER): Payer: BC Managed Care – PPO | Admitting: Physical Therapy

## 2022-12-21 ENCOUNTER — Encounter: Payer: Self-pay | Admitting: Physical Therapy

## 2022-12-21 DIAGNOSIS — M25511 Pain in right shoulder: Secondary | ICD-10-CM

## 2022-12-21 DIAGNOSIS — R600 Localized edema: Secondary | ICD-10-CM

## 2022-12-21 DIAGNOSIS — M25611 Stiffness of right shoulder, not elsewhere classified: Secondary | ICD-10-CM

## 2022-12-21 DIAGNOSIS — M6281 Muscle weakness (generalized): Secondary | ICD-10-CM

## 2022-12-21 DIAGNOSIS — G8929 Other chronic pain: Secondary | ICD-10-CM

## 2022-12-21 NOTE — Therapy (Signed)
OUTPATIENT PHYSICAL THERAPY    Patient Name: [name] MRN: [id number] DOB:Mar 08, 1956, 67 y.o., female Today's Date: 12/21/2022  END OF SESSION:  PT End of Session - 12/21/22 1602     Visit Number 2    Number of Visits 20    Date for PT Re-Evaluation 02/23/23    Authorization Type [employer] [id number]    PT Start Time 1515    PT Stop Time 1555    PT Time Calculation (min) 40 min    Activity Tolerance Patient limited by pain    Behavior During Therapy [facility] for tasks assessed/performed             Past Medical History:  Diagnosis Date   Anemia 2006   required transfusion post TAH/BSO 05/2005   Arthritis    Asthma    Chronic cough    Colon polyps    hyperplastic   Diabetes mellitus without complication (HCC)    Diverticulosis    GERD (gastroesophageal reflux disease)    on prilosec   GLA deficiency (HCC)    Glaucoma of both eyes    Hypertension    Joint pain    Osteoarthritis    Pancreatitis    Shortness of breath dyspnea    Stroke [facility])    TIA 2022   Vitamin D deficiency    Past Surgical History:  Procedure Laterality Date   BICEPT TENODESIS Right 11/19/2022   Procedure: BICEPS TENODESIS;  Surgeon: Cammy Copa, MD;  Location: [facility] OR;  Service: Orthopedics;  Laterality: Right;   BREAST EXCISIONAL BIOPSY Right 1990   COLONOSCOPY N/A 08/22/2013   Procedure: COLONOSCOPY;  Surgeon: Hilarie Fredrickson, MD;  Location[facility] ENDOSCOPY;  Service: Endoscopy;  Laterality: N/A;   ESOPHAGOGASTRODUODENOSCOPY N/A 08/22/2013   Procedure: ESOPHAGOGASTRODUODENOSCOPY (EGD);  Surgeon: Hilarie Fredrickson, MD;  Location: [facility] ENDOSCOPY;  Service: Endoscopy;  Laterality: N/A;   ESOPHAGOGASTRODUODENOSCOPY (EGD) WITH PROPOFOL N/A 08/19/2015   Procedure: ESOPHAGOGASTRODUODENOSCOPY (EGD) WITH PROPOFOL;  Surgeon: Hilarie Fredrickson, MD;  Location: [facility] ENDOSCOPY;  Service: Endoscopy;  Laterality: N/A;   HYSTEROSCOPY WITH D & C  01/2005   for uterine fibroids.    INSERTION OF MESH N/A 08/24/2013   Procedure:  INSERTION OF MESH;  Surgeon: Mariella Saa, MD;  Location: [facility] OR;  Service: General;  Laterality: N/A;   LIPOMA EXCISION Left 06/21/2015   Procedure: EXCISION OF LEFT SCALP LIPOMA;  Surgeon: Luretha Murphy, MD;  Location: [facility];  Service: General;  Laterality: Left;   PANNICULECTOMY N/A 08/24/2013   Procedure: PANNICULECTOMY;  Surgeon: Mariella Saa, MD;  Location: [facility] OR;  Service: General;  Laterality: N/A;   REVERSE SHOULDER ARTHROPLASTY Right 11/19/2022   Procedure: REVERSE SHOULDER ARTHROPLASTY, LIPOMA REMOVAL;  Surgeon: Cammy Copa, MD;  Location: [facility] OR;  Service: Orthopedics;  Laterality: Right;   TOTAL ABDOMINAL HYSTERECTOMY W/ BILATERAL SALPINGOOPHORECTOMY  05/2005   TOTAL HIP ARTHROPLASTY Right 12/12/2013   Procedure: RIGHT TOTAL HIP ARTHROPLASTY ANTERIOR APPROACH;  Surgeon: Kathryne Hitch, MD;  Location: [facility] OR;  Service: Orthopedics;  Laterality: Right;   VENTRAL HERNIA REPAIR N/A 08/24/2013   Procedure: HERNIA REPAIR VENTRAL ADULT;  Surgeon: Mariella Saa, MD;  Location: [facility] OR;  Service: General;  Laterality: N/A;   Patient Active Problem List   Diagnosis Date Noted   Arthritis of right shoulder region 11/22/2022   Biceps tendonitis on right 11/22/2022   Lipoma of right shoulder 11/22/2022   S/P reverse total shoulder arthroplasty, right 11/19/2022  Poorly controlled persistent asthma 02/27/2022   Pain in right shoulder 02/09/2022   Asthma exacerbation 09/19/2020   Anemia 10/13/2019   Glaucoma 10/13/2019   Hypertensive disorder 10/13/2019   Uterine leiomyoma 10/13/2019   Vitamin D insufficiency 04/26/2019   TIA (transient ischemic attack) 03/30/2019   Hyperlipidemia 03/30/2019   Thyroid nodule 03/30/2019   Stroke-like episode s/p IV tPA 03/28/2019   GERD (gastroesophageal reflux disease) 06/19/2018   Constipation 06/19/2018   Other fatigue 08/02/2017   Shortness of breath on exertion 08/02/2017   Mass of axilla  07/07/2017   Abdominal pain, epigastric    Abnormal CT of the abdomen    Pancreatitis, acute    Acute pancreatitis 08/13/2015   UTI (lower urinary tract infection) 08/13/2015   Severe obesity (BMI >= 40) (HCC) 01/23/2015   Sinusitis, chronic 01/08/2015   Cough variant asthma 01/03/2015   Arthritis of right hip 12/12/2013   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   Benign neoplasm of colon 08/22/2013   Special screening for malignant neoplasms, colon 08/22/2013   Abdominal pain 08/19/2013   Diabetes mellitus without complication (HCC) 08/19/2013   Essential hypertension, benign 08/19/2013   Ventral hernia 08/19/2013    PCP: Tally Joe MD  REFERRING PROVIDER: Julieanne Cotton, PA-C  REFERRING DIAG: [id number] (ICD-10-CM) - S/P reverse total shoulder arthroplasty, right  THERAPY DIAG:  Chronic right shoulder pain  Muscle weakness (generalized)  Stiffness of right shoulder, not elsewhere classified  Localized edema  Rationale for Evaluation and Treatment: Rehabilitation  ONSET DATE: Surgery 11/19/2022 Rt reverse TSA  SUBJECTIVE:                                                                                                                                                                                      SUBJECTIVE STATEMENT: Pt arriving today reporting no pain at rest. 3/10 pain in her Rt shoulder with activites. Pt stating she was having a hard time logging into her email to get her exercises.  PERTINENT HISTORY: asthma, arthritis, DM, GERD HTN, glaucoma both eyes, abdominal hysterectomy, Rt THA  PAIN:  NPRS scale: 3/10 Pain location: Rt shoulder  Pain description: pressure, achy Aggravating factors: reaching, lifting, nighttime, writing/typing.  Relieving factors: muscle relaxer  PRECAUTIONS: Shoulder Rt reverse TSA - limit IR strengthening, ER mobility in short term.   WEIGHT BEARING RESTRICTIONS:  Rt reverse TSA  FALLS:  Has patient fallen in  last 6 months? No  LIVING ENVIRONMENT: Lives with: lives with their family Lives in: House/apartment Stairs: 2 stairs to enter.   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   PLOF: Independent, Rt hand dominant, typing on computer, reports .  PATIENT GOALS:Reduce pain, use arm better, comb hair.   OBJECTIVE:   PATIENT SURVEYS:  12/15/2022 FOTO intake: 36    predicted:  59  COGNITION: 12/15/2022 Overall cognitive status: WFL     SENSATION: 12/15/2022 No specific testing today  POSTURE: 12/15/2022 Rounded shoulders noted, increased high guard on Rt arm in sitting.   UPPER EXTREMITY MMT:   12/15/2022: No formal testing for Rt shoulder due to recent surgery.  MMT Right 12/15/2022 Left 12/15/2022  Shoulder flexion  5/5  Shoulder extension    Shoulder abduction  4+/5  Shoulder adduction    Shoulder internal rotation  5/5  Shoulder external rotation  5/5  Elbow flexion  5/5  Elbow extension  5/5  Wrist flexion    Wrist extension    Wrist ulnar deviation    Wrist radial deviation    Wrist pronation    Wrist supination    (Blank rows = not tested)  UPPER EXTREMITY ROM:  12/15/2022:  Pain limited in all directions Rt shoulder.   ROM Right 12/15/2022 Left 12/15/2022  Shoulder flexion 115 AROM (120 PROM) In supine   Shoulder extension    Shoulder abduction 75 AROM (75 PROM) In supine   Shoulder adduction    Shoulder internal rotation 42 PROM in 20 deg abduction supine   Shoulder external rotation 30 PROM in 20 deg abduction supine   Middle trapezius    Lower trapezius    Elbow flexion    Elbow extension    Wrist flexion    Wrist extension    Wrist ulnar deviation    Wrist radial deviation    Wrist pronation    Wrist supination    Grip strength (lbs)    (Blank rows = not tested)  SHOULDER SPECIAL TESTS: 12/15/2022: + shrug on Rt in elevation attempt   JOINT MOBILITY TESTING:  12/15/2022 No specific testing today  PALPATION:   12/15/2022 Tenderness around Rt shoulder incision                                                                                                                                                                                                  TODAY'S TREATMENT:  DATE:  12/21/22 Therex: Supine: shoulder flexion c 1 # bar  2 x 10  Supine ER of Rt shoulder c 1 # bar 2 x 10 Bicep curls c 1 # weight x 15  Supine cervical rotation x 3 to each side holding 5 sec Supine cervical retraction x 5 holding 5 sec Standing pendulums: flexion x 2 minutes Manual:  PROM of Rt shoulder to pt's tolerance    TODAY'S TREATMENT:                                                                                                        DATE:12/15/2022 Therex:    HEP instruction/performance c cues for techniques, handout provided.  Trial set performed of each for comprehension and symptom assessment.  See below for exercise list   PATIENT EDUCATION: 12/15/2022 Education details: HEP, POC Person educated: Patient Education method: Explanation, Demonstration, Verbal cues, and Handouts Education comprehension: verbalized understanding, returned demonstration, and verbal cues required  HOME EXERCISE PROGRAM: Access Code: [email or web address] URL: [email or web address] Date: 12/15/2022 Prepared by: Chyrel Masson  Exercises - Supine Shoulder Flexion Extension AAROM with Dowel  - 2-3 x daily - 7 x weekly - 1-2 sets - 10-15 reps - 3 hold - Supine Shoulder Protraction with Dowel (Mirrored)  - 2-3 x daily - 7 x weekly - 1-2 sets - 10-15 reps - 3 hold - Seated Scapular Retraction  - 3-5 x daily - 7 x weekly - 1 sets - 10 reps - 3-5 hold - Standing Flexion Extension Shoulder Pendulum Supported with Arm Bent  - 2-3 x daily - 7 x weekly - 1-2 sets - 10 reps - Standing Circular Shoulder Pendulum Supported with Arm  Bent  - 2-3 x daily - 7 x weekly - 1-2 sets - 10 reps  ASSESSMENT:  CLINICAL IMPRESSION: Pt arriving today reporting no pain at rest and 3/10 pain with Rt shoulder movements. Pt tolerating exercises well. Pt was instructed to how to down load the Medbridge HEP app where she was able to pull up her exercises. Pt able to demonstrate compliance. Continue skilled PT to maximize pt's function and Rt shoulder mobility.    OBJECTIVE IMPAIRMENTS: decreased activity tolerance, decreased coordination, decreased endurance, decreased mobility, decreased ROM, decreased strength, increased edema, increased fascial restrictions, impaired perceived functional ability, impaired flexibility, impaired UE functional use, improper body mechanics, and pain.   ACTIVITY LIMITATIONS: carrying, lifting, sleeping, transfers, bed mobility, bathing, dressing, reach over head, and hygiene/grooming  PARTICIPATION LIMITATIONS: meal prep, cleaning, laundry, interpersonal relationship, driving, shopping, community activity, and occupation  PERSONAL FACTORS:  asthma, arthritis, DM, GERD HTN, glaucoma both eyes, abdominal hysterectomy, Rt THA  are also affecting patient's functional outcome.   REHAB POTENTIAL: Good  CLINICAL DECISION MAKING: Stable/uncomplicated  EVALUATION COMPLEXITY: Low   GOALS: Goals reviewed with patient? Yes  SHORT TERM GOALS: (target date for Short term goals are 3 weeks 01/05/2023)  1.Patient will demonstrate independent use of home exercise program to maintain progress from in clinic treatments. Goal status: On going 12/21/22  LONG TERM GOALS: (target dates for all long  term goals are 10 weeks  02/23/2023 )   1. Patient will demonstrate/report pain at worst less than or equal to 2/10 to facilitate minimal limitation in daily activity secondary to pain symptoms. Goal status: New   2. Patient will demonstrate independent use of home exercise program to facilitate ability to maintain/progress  functional gains from skilled physical therapy services. Goal status: New   3. Patient will demonstrate FOTO outcome > or = 59 % to indicate reduced disability due to condition. Goal status: New   4.  Patient will demonstrate Rt UE MMT > or = 4/5 throughout to facilitate lifting, reaching, carrying at PLOF in daily activity.   Goal status: New   5.  Patient will demonstrate Rt GH joint AROM WFL s symptoms to facilitate usual overhead reaching, self care, dressing at PLOF.    Goal status: New   6.  Patient will demonstrate/report return to work at [employer].  Goal status: New   PLAN:  PT FREQUENCY: 1-2x/week  PT DURATION: 10 weeks  PLANNED INTERVENTIONS: Therapeutic exercises, Therapeutic activity, Neuro Muscular re-education, Balance training, Gait training, Patient/Family education, Joint mobilization, Stair training, DME instructions, Dry Needling, Electrical stimulation, Traction, Cryotherapy, vasopneumatic device Moist heat, Taping, Ultrasound, Ionotophoresis 4mg /ml Dexamethasone, and aquatic therapy ,Manual therapy.  All included unless contraindicated  PLAN FOR NEXT SESSION: Review HEP knowledge/results.  PROM/AAROM gains.   Watch loaded IR strengthening, ER mobility past 30 in short term 4-6 weeks after surgery per usual reverse shoulder approach.    Narda Amber, PT,  MPT 12/21/22 4:03 PM   12/21/22  4:03 PM

## 2022-12-22 ENCOUNTER — Encounter: Payer: BC Managed Care – PPO | Admitting: Physical Therapy

## 2022-12-23 ENCOUNTER — Encounter: Payer: Self-pay | Admitting: Rehabilitative and Restorative Service Providers"

## 2022-12-23 ENCOUNTER — Ambulatory Visit (INDEPENDENT_AMBULATORY_CARE_PROVIDER_SITE_OTHER): Payer: BC Managed Care – PPO | Admitting: Rehabilitative and Restorative Service Providers"

## 2022-12-23 DIAGNOSIS — M6281 Muscle weakness (generalized): Secondary | ICD-10-CM | POA: Diagnosis not present

## 2022-12-23 DIAGNOSIS — R600 Localized edema: Secondary | ICD-10-CM

## 2022-12-23 DIAGNOSIS — M25511 Pain in right shoulder: Secondary | ICD-10-CM

## 2022-12-23 DIAGNOSIS — M25611 Stiffness of right shoulder, not elsewhere classified: Secondary | ICD-10-CM

## 2022-12-23 DIAGNOSIS — G8929 Other chronic pain: Secondary | ICD-10-CM

## 2022-12-23 MED ORDER — OXYCODONE-ACETAMINOPHEN 5-325 MG PO TABS: 1.0000 | ORAL_TABLET | Freq: Three times a day (TID) | ORAL | 0 refills | Status: DC | PRN

## 2022-12-23 NOTE — Therapy (Signed)
OUTPATIENT PHYSICAL THERAPY    Patient Name: [name] MRN: [id number] DOB:[birth year], 67 y.o., female Today's Date: 12/23/2022  END OF SESSION:  PT End of Session - 12/23/22 1351     Visit Number 3    Number of Visits 20    Date for PT Re-Evaluation 02/23/23    Authorization Type [id number]    Progress Note Due on Visit 10    PT Start Time 1344    PT Stop Time 1422    PT Time Calculation (min) 38 min    Activity Tolerance Patient limited by pain    Behavior During Therapy [facility] for tasks assessed/performed              Past Medical History:  Diagnosis Date   Anemia 2006   required transfusion post TAH/BSO 05/2005   Arthritis    Asthma    Chronic cough    Colon polyps    hyperplastic   Diabetes mellitus without complication (HCC)    Diverticulosis    GERD (gastroesophageal reflux disease)    on prilosec   GLA deficiency (HCC)    Glaucoma of both eyes    Hypertension    Joint pain    Osteoarthritis    Pancreatitis    Shortness of breath dyspnea    Stroke [facility])    TIA 2022   Vitamin D deficiency    Past Surgical History:  Procedure Laterality Date   BICEPT TENODESIS Right 11/19/2022   Procedure: BICEPS TENODESIS;  Surgeon: Cammy Copa, MD;  Location: [facility] OR;  Service: Orthopedics;  Laterality: Right;   BREAST EXCISIONAL BIOPSY Right 1990   COLONOSCOPY N/A 08/22/2013   Procedure: COLONOSCOPY;  Surgeon: Hilarie Fredrickson, MD;  Location: [facility] ENDOSCOPY;  Service: Endoscopy;  Laterality: N/A;   ESOPHAGOGASTRODUODENOSCOPY N/A 08/22/2013   Procedure: ESOPHAGOGASTRODUODENOSCOPY (EGD);  Surgeon: Hilarie Fredrickson, MD;  Location: [facility] ENDOSCOPY;  Service: Endoscopy;  Laterality: N/A;   ESOPHAGOGASTRODUODENOSCOPY (EGD) WITH PROPOFOL N/A 08/19/2015   Procedure: ESOPHAGOGASTRODUODENOSCOPY (EGD) WITH PROPOFOL;  Surgeon: Hilarie Fredrickson, MD;  Location: [facility] ENDOSCOPY;  Service: Endoscopy;  Laterality: N/A;   HYSTEROSCOPY WITH D & C  01/2005   for uterine fibroids.    INSERTION OF  MESH N/A 08/24/2013   Procedure: INSERTION OF MESH;  Surgeon: Mariella Saa, MD;  Location: [facility] OR;  Service: General;  Laterality: N/A;   LIPOMA EXCISION Left 06/21/2015   Procedure: EXCISION OF LEFT SCALP LIPOMA;  Surgeon: Luretha Murphy, MD;  Location: [facility];  Service: General;  Laterality: Left;   PANNICULECTOMY N/A 08/24/2013   Procedure: PANNICULECTOMY;  Surgeon: Mariella Saa, MD;  Location: [facility] OR;  Service: General;  Laterality: N/A;   REVERSE SHOULDER ARTHROPLASTY Right 11/19/2022   Procedure: REVERSE SHOULDER ARTHROPLASTY, LIPOMA REMOVAL;  Surgeon: Cammy Copa, MD;  Location: [facility] OR;  Service: Orthopedics;  Laterality: Right;   TOTAL ABDOMINAL HYSTERECTOMY W/ BILATERAL SALPINGOOPHORECTOMY  05/2005   TOTAL HIP ARTHROPLASTY Right 12/12/2013   Procedure: RIGHT TOTAL HIP ARTHROPLASTY ANTERIOR APPROACH;  Surgeon: Kathryne Hitch, MD;  Location: [facility] OR;  Service: Orthopedics;  Laterality: Right;   VENTRAL HERNIA REPAIR N/A 08/24/2013   Procedure: HERNIA REPAIR VENTRAL ADULT;  Surgeon: Mariella Saa, MD;  Location: [facility] OR;  Service: General;  Laterality: N/A;   Patient Active Problem List   Diagnosis Date Noted   Arthritis of right shoulder region 11/22/2022   Biceps tendonitis on right 11/22/2022   Lipoma of right shoulder 11/22/2022  S/P reverse total shoulder arthroplasty, right 11/19/2022   Poorly controlled persistent asthma 02/27/2022   Pain in right shoulder 02/09/2022   Asthma exacerbation 09/19/2020   Anemia 10/13/2019   Glaucoma 10/13/2019   Hypertensive disorder 10/13/2019   Uterine leiomyoma 10/13/2019   Vitamin D insufficiency 04/26/2019   TIA (transient ischemic attack) 03/30/2019   Hyperlipidemia 03/30/2019   Thyroid nodule 03/30/2019   Stroke-like episode s/p IV tPA 03/28/2019   GERD (gastroesophageal reflux disease) 06/19/2018   Constipation 06/19/2018   Other fatigue 08/02/2017   Shortness of breath on exertion  08/02/2017   Mass of axilla 07/07/2017   Abdominal pain, epigastric    Abnormal CT of the abdomen    Pancreatitis, acute    Acute pancreatitis 08/13/2015   UTI (lower urinary tract infection) 08/13/2015   Severe obesity (BMI >= 40) (HCC) 01/23/2015   Sinusitis, chronic 01/08/2015   Cough variant asthma 01/03/2015   Arthritis of right hip 12/12/2013   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   Benign neoplasm of colon 08/22/2013   Special screening for malignant neoplasms, colon 08/22/2013   Abdominal pain 08/19/2013   Diabetes mellitus without complication (HCC) 08/19/2013   Essential hypertension, benign 08/19/2013   Ventral hernia 08/19/2013    PCP: Tally Joe MD  REFERRING PROVIDER: Julieanne Cotton, PA-C  REFERRING DIAG: [phone] (ICD-10-CM) - S/P reverse total shoulder arthroplasty, right  THERAPY DIAG:  Chronic right shoulder pain  Muscle weakness (generalized)  Stiffness of right shoulder, not elsewhere classified  Localized edema  Rationale for Evaluation and Treatment: Rehabilitation  ONSET DATE: Surgery 11/19/2022 Rt reverse TSA  SUBJECTIVE:                                                                                                                                                                                      SUBJECTIVE STATEMENT: She reported variable symptoms during the day but usual always feeling something. Reported continued difficulty reaching up to shelves.   PERTINENT HISTORY: asthma, arthritis, DM, GERD HTN, glaucoma both eyes, abdominal hysterectomy, Rt THA  PAIN:  NPRS scale: 5/10 Pain location: Rt shoulder  Pain description: pressure, achy Aggravating factors: reaching, lifting, nighttime, writing/typing.  Relieving factors: muscle relaxer  PRECAUTIONS: Shoulder Rt reverse TSA - limit IR strengthening, ER mobility in short term.   WEIGHT BEARING RESTRICTIONS:  Rt reverse TSA  FALLS:  Has patient fallen in last 6  months? No  LIVING ENVIRONMENT: Lives with: lives with their family Lives in: House/apartment Stairs: 2 stairs to enter.   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   PLOF: Independent, Rt hand dominant, typing on computer, reports .  PATIENT GOALS:Reduce pain,  use arm better, comb hair.   OBJECTIVE:   PATIENT SURVEYS:  12/15/2022 FOTO intake: 36    predicted:  59  COGNITION: 12/15/2022 Overall cognitive status: WFL     SENSATION: 12/15/2022 No specific testing today  POSTURE: 12/15/2022 Rounded shoulders noted, increased high guard on Rt arm in sitting.   UPPER EXTREMITY MMT:   12/15/2022: No formal testing for Rt shoulder due to recent surgery.  MMT Right 12/15/2022 Left 12/15/2022  Shoulder flexion  5/5  Shoulder extension    Shoulder abduction  4+/5  Shoulder adduction    Shoulder internal rotation  5/5  Shoulder external rotation  5/5  Elbow flexion  5/5  Elbow extension  5/5  Wrist flexion    Wrist extension    Wrist ulnar deviation    Wrist radial deviation    Wrist pronation    Wrist supination    (Blank rows = not tested)  UPPER EXTREMITY ROM:  12/15/2022:  Pain limited in all directions Rt shoulder.   ROM Right 12/15/2022 Left 12/15/2022 Right 12/23/2022  Shoulder flexion 115 AROM (120 PROM) In supine  125 PROM  Shoulder extension     Shoulder abduction 75 AROM (75 PROM) In supine  100 PROM  Shoulder adduction     Shoulder internal rotation 42 PROM in 20 deg abduction supine    Shoulder external rotation 30 PROM in 20 deg abduction supine  30 PROM in 20 deg abduction supine Limited by post surgical protocol  Middle trapezius     Lower trapezius     Elbow flexion     Elbow extension     Wrist flexion     Wrist extension     Wrist ulnar deviation     Wrist radial deviation     Wrist pronation     Wrist supination     Grip strength (lbs)     (Blank rows = not tested)  SHOULDER SPECIAL TESTS: 12/15/2022: + shrug on Rt in  elevation attempt   JOINT MOBILITY TESTING:  12/15/2022 No specific testing today  PALPATION:  12/15/2022 Tenderness around Rt shoulder incision                                                                                                                                                                                                  TODAY'S TREATMENT:  DATE:  12/23/2022 Therex: UBE fwd/back 3 mins each way with 30 sec rest rest between, lvl 1 Supine AAROM wand flexion 1 lb bar x 10 Supine AROM Rt arm 2 x 10  Supine 1 lb wand AAROM protraction 3 sec hold x 10 Standing tband green rows 2 x 10 Standing tband green gh ext 2 x 10 Seated pulleys flexion, scaption without stretched arm 2 mins each way    Manual:  PROM of Rt shoulder to pt's tolerance  TODAY'S TREATMENT:                                                                                                       DATE:  12/21/2022 Therex: Supine: shoulder flexion c 1 # bar  2 x 10  Supine ER of Rt shoulder c 1 # bar 2 x 10 Bicep curls c 1 # weight x 15  Supine cervical rotation x 3 to each side holding 5 sec Supine cervical retraction x 5 holding 5 sec Standing pendulums: flexion x 2 minutes  Manual:  PROM of Rt shoulder to pt's tolerance  TODAY'S TREATMENT:                                                                                                       DATE:12/15/2022 Therex:    HEP instruction/performance c cues for techniques, handout provided.  Trial set performed of each for comprehension and symptom assessment.  See below for exercise list   PATIENT EDUCATION: 12/15/2022 Education details: HEP, POC Person educated: Patient Education method: Explanation, Demonstration, Verbal cues, and Handouts Education comprehension: verbalized understanding, returned demonstration, and verbal cues required  HOME EXERCISE  PROGRAM: Access Code: [email or web address] URL: [email or web address] Date: 12/15/2022 Prepared by: Chyrel Masson  Exercises - Supine Shoulder Flexion Extension AAROM with Dowel  - 2-3 x daily - 7 x weekly - 1-2 sets - 10-15 reps - 3 hold - Supine Shoulder Protraction with Dowel (Mirrored)  - 2-3 x daily - 7 x weekly - 1-2 sets - 10-15 reps - 3 hold - Seated Scapular Retraction  - 3-5 x daily - 7 x weekly - 1 sets - 10 reps - 3-5 hold - Standing Flexion Extension Shoulder Pendulum Supported with Arm Bent  - 2-3 x daily - 7 x weekly - 1-2 sets - 10 reps - Standing Circular Shoulder Pendulum Supported with Arm Bent  - 2-3 x daily - 7 x weekly - 1-2 sets - 10 reps  ASSESSMENT:  CLINICAL IMPRESSION: Reassessment of passive range showed some improvements compared to evaluation.  Room for improvement still there as well as progression of active range/strength.  Continued skilled PT services warranted  at this time.   OBJECTIVE IMPAIRMENTS: decreased activity tolerance, decreased coordination, decreased endurance, decreased mobility, decreased ROM, decreased strength, increased edema, increased fascial restrictions, impaired perceived functional ability, impaired flexibility, impaired UE functional use, improper body mechanics, and pain.   ACTIVITY LIMITATIONS: carrying, lifting, sleeping, transfers, bed mobility, bathing, dressing, reach over head, and hygiene/grooming  PARTICIPATION LIMITATIONS: meal prep, cleaning, laundry, interpersonal relationship, driving, shopping, community activity, and occupation  PERSONAL FACTORS:  asthma, arthritis, DM, GERD HTN, glaucoma both eyes, abdominal hysterectomy, Rt THA  are also affecting patient's functional outcome.   REHAB POTENTIAL: Good  CLINICAL DECISION MAKING: Stable/uncomplicated  EVALUATION COMPLEXITY: Low   GOALS: Goals reviewed with patient? Yes  SHORT TERM GOALS: (target date for Short term goals are 3 weeks 01/05/2023)  1.Patient  will demonstrate independent use of home exercise program to maintain progress from in clinic treatments. Goal status: On going 12/21/22  LONG TERM GOALS: (target dates for all long term goals are 10 weeks  02/23/2023 )   1. Patient will demonstrate/report pain at worst less than or equal to 2/10 to facilitate minimal limitation in daily activity secondary to pain symptoms. Goal status: New   2. Patient will demonstrate independent use of home exercise program to facilitate ability to maintain/progress functional gains from skilled physical therapy services. Goal status: New   3. Patient will demonstrate FOTO outcome > or = 59 % to indicate reduced disability due to condition. Goal status: New   4.  Patient will demonstrate Rt UE MMT > or = 4/5 throughout to facilitate lifting, reaching, carrying at PLOF in daily activity.   Goal status: New   5.  Patient will demonstrate Rt GH joint AROM WFL s symptoms to facilitate usual overhead reaching, self care, dressing at PLOF.    Goal status: New   6.  Patient will demonstrate/report return to work at [employer].  Goal status: New   PLAN:  PT FREQUENCY: 1-2x/week  PT DURATION: 10 weeks  PLANNED INTERVENTIONS: Therapeutic exercises, Therapeutic activity, Neuro Muscular re-education, Balance training, Gait training, Patient/Family education, Joint mobilization, Stair training, DME instructions, Dry Needling, Electrical stimulation, Traction, Cryotherapy, vasopneumatic device Moist heat, Taping, Ultrasound, Ionotophoresis 4mg /ml Dexamethasone, and aquatic therapy ,Manual therapy.  All included unless contraindicated  PLAN FOR NEXT SESSION: Gravity reduced AROM/AAROM for mobility and improved strength.   Watch loaded IR strengthening, ER mobility past 30 in short term 4-6 weeks after surgery per usual reverse shoulder approach.    Chyrel Masson, PT, DPT, OCS, ATC 12/23/22  2:19 PM

## 2022-12-28 ENCOUNTER — Encounter: Payer: BC Managed Care – PPO | Admitting: Physical Therapy

## 2022-12-29 ENCOUNTER — Telehealth: Payer: Self-pay | Admitting: Rehabilitative and Restorative Service Providers"

## 2022-12-29 ENCOUNTER — Encounter: Payer: BC Managed Care – PPO | Admitting: Rehabilitative and Restorative Service Providers"

## 2022-12-29 NOTE — Telephone Encounter (Signed)
Called patient after 15 mins no show for appointment today.  She mentioned not knowing today's appointment.  Reminded her of next appointment.    Chyrel Masson, PT, DPT, OCS, ATC 12/29/22  4:19 PM

## 2023-01-01 ENCOUNTER — Telehealth: Payer: Self-pay

## 2023-01-01 ENCOUNTER — Encounter: Payer: Self-pay | Admitting: Orthopedic Surgery

## 2023-01-01 ENCOUNTER — Ambulatory Visit (INDEPENDENT_AMBULATORY_CARE_PROVIDER_SITE_OTHER): Payer: BC Managed Care – PPO | Admitting: Orthopedic Surgery

## 2023-01-01 ENCOUNTER — Ambulatory Visit: Payer: BC Managed Care – PPO | Admitting: Podiatry

## 2023-01-01 DIAGNOSIS — Z96611 Presence of right artificial shoulder joint: Secondary | ICD-10-CM

## 2023-01-01 NOTE — Telephone Encounter (Signed)
-----   Message from Burnard Bunting sent at 01/01/2023  2:38 PM EDT ----- [name] please have her follow-up with Franky Macho in 6 weeks for final check thanks

## 2023-01-01 NOTE — Progress Notes (Signed)
Post-Op Visit Note   Patient: [name]           Date of Birth: December 13, 1955           MRN: [id number] Visit Date: 01/01/2023 PCP: Tally Joe, MD   Assessment & Plan:  Chief Complaint:  Chief Complaint  Patient presents with   Right Shoulder - Follow-up   Visit Diagnoses:  1. S/P reverse total shoulder arthroplasty, right     Plan: Now that is now 1 month out right shoulder RSA lipoma removal.  Still having some pain.  More pain at night than anything.  Therapy is 2 times a week.  On examination patient can get her arm behind her head.  Subscap strength and external rotation strength both 4 out of 5.  She is not taking any narcotics for pain.  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  Plan at this time is to continue home exercise program and follow-up in 6 weeks for final check.  Follow-Up Instructions: No follow-ups on file.   Orders:  No orders of the defined types were placed in this encounter.  No orders of the defined types were placed in this encounter.   Imaging: No results found.  PMFS History: Patient Active Problem List   Diagnosis Date Noted   Arthritis of right shoulder region 11/22/2022   Biceps tendonitis on right 11/22/2022   Lipoma of right shoulder 11/22/2022   S/P reverse total shoulder arthroplasty, right 11/19/2022   Poorly controlled persistent asthma 02/27/2022   Pain in right shoulder 02/09/2022   Asthma exacerbation 09/19/2020   Anemia 10/13/2019   Glaucoma 10/13/2019   Hypertensive disorder 10/13/2019   Uterine leiomyoma 10/13/2019   Vitamin D insufficiency 04/26/2019   TIA (transient ischemic attack) 03/30/2019   Hyperlipidemia 03/30/2019   Thyroid nodule 03/30/2019   Stroke-like episode s/p IV tPA 03/28/2019   GERD (gastroesophageal reflux disease) 06/19/2018   Constipation 06/19/2018   Other fatigue 08/02/2017   Shortness of breath on exertion 08/02/2017   Mass of axilla 07/07/2017   Abdominal pain, epigastric     Abnormal CT of the abdomen    Pancreatitis, acute    Acute pancreatitis 08/13/2015   UTI (lower urinary tract infection) 08/13/2015   Severe obesity (BMI >= 40) (HCC) 01/23/2015   Sinusitis, chronic 01/08/2015   Cough variant asthma 01/03/2015   Arthritis of right hip 12/12/2013   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   Benign neoplasm of colon 08/22/2013   Special screening for malignant neoplasms, colon 08/22/2013   Abdominal pain 08/19/2013   Diabetes mellitus without complication (HCC) 08/19/2013   Essential hypertension, benign 08/19/2013   Ventral hernia 08/19/2013   Past Medical History:  Diagnosis Date   Anemia 2006   required transfusion post TAH/BSO 05/2005   Arthritis    Asthma    Chronic cough    Colon polyps    hyperplastic   Diabetes mellitus without complication (HCC)    Diverticulosis    GERD (gastroesophageal reflux disease)    on prilosec   GLA deficiency (HCC)    Glaucoma of both eyes    Hypertension    Joint pain    Osteoarthritis    Pancreatitis    Shortness of breath dyspnea    Stroke [facility])    TIA 2022   Vitamin D deficiency     Family History  Problem Relation Age of Onset   Emphysema Mother        smoked  Diabetes Mother    Hypertension Mother    Colon cancer Father        7-s   Hypertension Father    Heart disease Father    High Cholesterol Father    Breast cancer Sister     Past Surgical History:  Procedure Laterality Date   BICEPT TENODESIS Right 11/19/2022   Procedure: BICEPS TENODESIS;  Surgeon: Cammy Copa, MD;  Location: [facility] OR;  Service: Orthopedics;  Laterality: Right;   BREAST EXCISIONAL BIOPSY Right 1990   COLONOSCOPY N/A 08/22/2013   Procedure: COLONOSCOPY;  Surgeon: Hilarie Fredrickson, MD;  Location: [facility] ENDOSCOPY;  Service: Endoscopy;  Laterality: N/A;   ESOPHAGOGASTRODUODENOSCOPY N/A 08/22/2013   Procedure: ESOPHAGOGASTRODUODENOSCOPY (EGD);  Surgeon: Hilarie Fredrickson, MD;  Location: [facility] ENDOSCOPY;  Service:  Endoscopy;  Laterality: N/A;   ESOPHAGOGASTRODUODENOSCOPY (EGD) WITH PROPOFOL N/A 08/19/2015   Procedure: ESOPHAGOGASTRODUODENOSCOPY (EGD) WITH PROPOFOL;  Surgeon: Hilarie Fredrickson, MD;  Location: [facility] ENDOSCOPY;  Service: Endoscopy;  Laterality: N/A;   HYSTEROSCOPY WITH D & C  01/2005   for uterine fibroids.    INSERTION OF MESH N/A 08/24/2013   Procedure: INSERTION OF MESH;  Surgeon: Mariella Saa, MD;  Location: [facility] OR;  Service: General;  Laterality: N/A;   LIPOMA EXCISION Left 06/21/2015   Procedure: EXCISION OF LEFT SCALP LIPOMA;  Surgeon: Luretha Murphy, MD;  Location: [facility];  Service: General;  Laterality: Left;   PANNICULECTOMY N/A 08/24/2013   Procedure: PANNICULECTOMY;  Surgeon: Mariella Saa, MD;  Location: [facility] OR;  Service: General;  Laterality: N/A;   REVERSE SHOULDER ARTHROPLASTY Right 11/19/2022   Procedure: REVERSE SHOULDER ARTHROPLASTY, LIPOMA REMOVAL;  Surgeon: Cammy Copa, MD;  Location: [facility] OR;  Service: Orthopedics;  Laterality: Right;   TOTAL ABDOMINAL HYSTERECTOMY W/ BILATERAL SALPINGOOPHORECTOMY  05/2005   TOTAL HIP ARTHROPLASTY Right 12/12/2013   Procedure: RIGHT TOTAL HIP ARTHROPLASTY ANTERIOR APPROACH;  Surgeon: Kathryne Hitch, MD;  Location: [facility] OR;  Service: Orthopedics;  Laterality: Right;   VENTRAL HERNIA REPAIR N/A 08/24/2013   Procedure: HERNIA REPAIR VENTRAL ADULT;  Surgeon: Mariella Saa, MD;  Location: [facility] OR;  Service: General;  Laterality: N/A;   Social History   Occupational History   Occupation: Child psychotherapist  Tobacco Use   Smoking status: Former    Current packs/day: 0.00    Average packs/day: 0.3 packs/day for 15.0 years (3.8 ttl pk-yrs)    Types: Cigarettes    Start date: 06/23/1963    Quit date: 06/22/1978    Years since quitting: 44.5   Smokeless tobacco: Never  Vaping Use   Vaping status: Never Used  Substance and Sexual Activity   Alcohol use: No    Alcohol/week: 0.0 standard drinks of alcohol    Drug use: No   Sexual activity: Yes

## 2023-01-01 NOTE — Telephone Encounter (Signed)
Pls make sure appt scheduled

## 2023-01-04 ENCOUNTER — Ambulatory Visit: Payer: BC Managed Care – PPO | Admitting: Rehabilitative and Restorative Service Providers"

## 2023-01-04 ENCOUNTER — Encounter: Payer: Self-pay | Admitting: Rehabilitative and Restorative Service Providers"

## 2023-01-04 DIAGNOSIS — M25511 Pain in right shoulder: Secondary | ICD-10-CM

## 2023-01-04 DIAGNOSIS — M25611 Stiffness of right shoulder, not elsewhere classified: Secondary | ICD-10-CM | POA: Diagnosis not present

## 2023-01-04 DIAGNOSIS — M6281 Muscle weakness (generalized): Secondary | ICD-10-CM | POA: Diagnosis not present

## 2023-01-04 DIAGNOSIS — G8929 Other chronic pain: Secondary | ICD-10-CM | POA: Diagnosis not present

## 2023-01-04 MED ORDER — METHOCARBAMOL 500 MG PO TABS: 500.0000 mg | ORAL_TABLET | Freq: Four times a day (QID) | ORAL | 0 refills | Status: DC | PRN

## 2023-01-04 NOTE — Therapy (Signed)
OUTPATIENT PHYSICAL THERAPY    Patient Name: [name] MRN: [id number] DOB:1955-11-14, 67 y.o., female Today's Date: 01/04/2023  END OF SESSION:  PT End of Session - 01/04/23 1558     Visit Number 4    Number of Visits 20    Date for PT Re-Evaluation 02/23/23    Authorization Type [employer] [id number]    Progress Note Due on Visit 10    PT Start Time 1554    PT Stop Time 1632    PT Time Calculation (min) 38 min    Activity Tolerance Patient tolerated treatment well    Behavior During Therapy [facility] for tasks assessed/performed               Past Medical History:  Diagnosis Date   Anemia 2006   required transfusion post TAH/BSO 05/2005   Arthritis    Asthma    Chronic cough    Colon polyps    hyperplastic   Diabetes mellitus without complication (HCC)    Diverticulosis    GERD (gastroesophageal reflux disease)    on prilosec   GLA deficiency (HCC)    Glaucoma of both eyes    Hypertension    Joint pain    Osteoarthritis    Pancreatitis    Shortness of breath dyspnea    Stroke [facility])    TIA 2022   Vitamin D deficiency    Past Surgical History:  Procedure Laterality Date   BICEPT TENODESIS Right 11/19/2022   Procedure: BICEPS TENODESIS;  Surgeon: Cammy Copa, MD;  Location: [facility] OR;  Service: Orthopedics;  Laterality: Right;   BREAST EXCISIONAL BIOPSY Right 1990   COLONOSCOPY N/A 08/22/2013   Procedure: COLONOSCOPY;  Surgeon: Hilarie Fredrickson, MD;  Location: [facility] ENDOSCOPY;  Service: Endoscopy;  Laterality: N/A;   ESOPHAGOGASTRODUODENOSCOPY N/A 08/22/2013   Procedure: ESOPHAGOGASTRODUODENOSCOPY (EGD);  Surgeon: Hilarie Fredrickson, MD;  Location: [facility] ENDOSCOPY;  Service: Endoscopy;  Laterality: N/A;   ESOPHAGOGASTRODUODENOSCOPY (EGD) WITH PROPOFOL N/A 08/19/2015   Procedure: ESOPHAGOGASTRODUODENOSCOPY (EGD) WITH PROPOFOL;  Surgeon: Hilarie Fredrickson, MD;  Location: [facility] ENDOSCOPY;  Service: Endoscopy;  Laterality: N/A;   HYSTEROSCOPY WITH D & C  01/2005   for uterine fibroids.     INSERTION OF MESH N/A 08/24/2013   Procedure: INSERTION OF MESH;  Surgeon: Mariella Saa, MD;  Location: [facility] OR;  Service: General;  Laterality: N/A;   LIPOMA EXCISION Left 06/21/2015   Procedure: EXCISION OF LEFT SCALP LIPOMA;  Surgeon: Luretha Murphy, MD;  Location: [facility];  Service: General;  Laterality: Left;   PANNICULECTOMY N/A 08/24/2013   Procedure: PANNICULECTOMY;  Surgeon: Mariella Saa, MD;  Location: [facility] OR;  Service: General;  Laterality: N/A;   REVERSE SHOULDER ARTHROPLASTY Right 11/19/2022   Procedure: REVERSE SHOULDER ARTHROPLASTY, LIPOMA REMOVAL;  Surgeon: Cammy Copa, MD;  Location: [facility] OR;  Service: Orthopedics;  Laterality: Right;   TOTAL ABDOMINAL HYSTERECTOMY W/ BILATERAL SALPINGOOPHORECTOMY  05/2005   TOTAL HIP ARTHROPLASTY Right 12/12/2013   Procedure: RIGHT TOTAL HIP ARTHROPLASTY ANTERIOR APPROACH;  Surgeon: Kathryne Hitch, MD;  Location: [facility] OR;  Service: Orthopedics;  Laterality: Right;   VENTRAL HERNIA REPAIR N/A 08/24/2013   Procedure: HERNIA REPAIR VENTRAL ADULT;  Surgeon: Mariella Saa, MD;  Location[facility] OR;  Service: General;  Laterality: N/A;   Patient Active Problem List   Diagnosis Date Noted   Arthritis of right shoulder region 11/22/2022   Biceps tendonitis on right 11/22/2022   Lipoma of right shoulder  11/22/2022   S/P reverse total shoulder arthroplasty, right 11/19/2022   Poorly controlled persistent asthma 02/27/2022   Pain in right shoulder 02/09/2022   Asthma exacerbation 09/19/2020   Anemia 10/13/2019   Glaucoma 10/13/2019   Hypertensive disorder 10/13/2019   Uterine leiomyoma 10/13/2019   Vitamin D insufficiency 04/26/2019   TIA (transient ischemic attack) 03/30/2019   Hyperlipidemia 03/30/2019   Thyroid nodule 03/30/2019   Stroke-like episode s/p IV tPA 03/28/2019   GERD (gastroesophageal reflux disease) 06/19/2018   Constipation 06/19/2018   Other fatigue 08/02/2017   Shortness of breath  on exertion 08/02/2017   Mass of axilla 07/07/2017   Abdominal pain, epigastric    Abnormal CT of the abdomen    Pancreatitis, acute    Acute pancreatitis 08/13/2015   UTI (lower urinary tract infection) 08/13/2015   Severe obesity (BMI >= 40) (HCC) 01/23/2015   Sinusitis, chronic 01/08/2015   Cough variant asthma 01/03/2015   Arthritis of right hip 12/12/2013   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   Benign neoplasm of colon 08/22/2013   Special screening for malignant neoplasms, colon 08/22/2013   Abdominal pain 08/19/2013   Diabetes mellitus without complication (HCC) 08/19/2013   Essential hypertension, benign 08/19/2013   Ventral hernia 08/19/2013    PCP: Tally Joe MD  REFERRING PROVIDER: Julieanne Cotton, PA-C  REFERRING DIAG: [id number] (ICD-10-CM) - S/P reverse total shoulder arthroplasty, right  THERAPY DIAG:  Chronic right shoulder pain  Muscle weakness (generalized)  Stiffness of right shoulder, not elsewhere classified  Rationale for Evaluation and Treatment: Rehabilitation  ONSET DATE: Surgery 11/19/2022 Rt reverse TSA  SUBJECTIVE:                                                                                                                                                                                      SUBJECTIVE STATEMENT: She indicated seeing MD since last therapy visit.  She reported some pain but not "a lot of pain"  She indicated doing a little more with arm during.   She indicated she has started back working.   PERTINENT HISTORY: asthma, arthritis, DM, GERD HTN, glaucoma both eyes, abdominal hysterectomy, Rt THA  PAIN:  NPRS scale: no pain upon arrival, at worst in last week 7/10 Pain location: Rt shoulder  Pain description: pressure, achy Aggravating factors: reaching, lifting, nighttime, writing/typing.  Relieving factors: muscle relaxer  PRECAUTIONS: Shoulder Rt reverse TSA - limit IR strengthening, ER mobility in  short term.   WEIGHT BEARING RESTRICTIONS:  Rt reverse TSA  FALLS:  Has patient fallen in last 6 months? No  LIVING ENVIRONMENT: Lives with: lives with their family Lives in: House/apartment Stairs:  2 stairs to enter.   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   PLOF: Independent, Rt hand dominant, typing on computer, reports .  PATIENT GOALS:Reduce pain, use arm better, comb hair.   OBJECTIVE:   PATIENT SURVEYS:  01/04/2023: FOTO update: 50  12/15/2022 FOTO intake: 36    predicted:  59  COGNITION: 12/15/2022 Overall cognitive status: WFL     SENSATION: 12/15/2022 No specific testing today  POSTURE: 12/15/2022 Rounded shoulders noted, increased high guard on Rt arm in sitting.   UPPER EXTREMITY MMT:   12/15/2022: No formal testing for Rt shoulder due to recent surgery.  MMT Right 12/15/2022 Left 12/15/2022 Right 01/04/2023   Shoulder flexion  5/5 3+/5  Shoulder extension     Shoulder abduction  4+/5 3+/5  Shoulder adduction     Shoulder internal rotation  5/5   Shoulder external rotation  5/5   Elbow flexion  5/5   Elbow extension  5/5   Wrist flexion     Wrist extension     Wrist ulnar deviation     Wrist radial deviation     Wrist pronation     Wrist supination     (Blank rows = not tested)  UPPER EXTREMITY ROM:  12/15/2022:  Pain limited in all directions Rt shoulder.   ROM Right 12/15/2022 Left 12/15/2022 Right 12/23/2022 Right 01/04/2023  Shoulder flexion 115 AROM (120 PROM) In supine  125 PROM AROM against gravity 110 c mild shrug   138 AROM in supine   Shoulder extension      Shoulder abduction 75 AROM (75 PROM) In supine  100 PROM 118 AROM in supine   Shoulder adduction      Shoulder internal rotation 42 PROM in 20 deg abduction supine     Shoulder external rotation 30 PROM in 20 deg abduction supine  30 PROM in 20 deg abduction supine Limited by post surgical protocol 58 AROM in 45 deg abduction in supine   Middle trapezius       Lower trapezius      Elbow flexion      Elbow extension      Wrist flexion      Wrist extension      Wrist ulnar deviation      Wrist radial deviation      Wrist pronation      Wrist supination      Grip strength (lbs)      (Blank rows = not tested)  SHOULDER SPECIAL TESTS: 12/15/2022: + shrug on Rt in elevation attempt   JOINT MOBILITY TESTING:  12/15/2022 No specific testing today  PALPATION:  12/15/2022 Tenderness around Rt shoulder incision  TODAY'S TREATMENT:                                                                                                       DATE:  01/04/2023 Therex: Supine AROM Rt shoulder flexion x 15  Lt sidelying Rt shoulder AROM abduction x 15  Lt sidelying Rt shoulder AROM ER c towel under arm x 15 Standing  AAROM flexion 1 lb wand to tolerance x 10 , watching shrug sign Seated pulleys flexion 3 mins, scaption 2 mins with outstretched arm Tband rows green 2 x 10 Tband GH ext green 2 x 10   Added and adjusted HEP as listed below to include new exercises above.    TODAY'S TREATMENT:                                                                                                       DATE:  12/23/2022 Therex: UBE fwd/back 3 mins each way with 30 sec rest rest between, lvl 1 Supine AAROM wand flexion 1 lb bar x 10 Supine AROM Rt arm 2 x 10  Supine 1 lb wand AAROM protraction 3 sec hold x 10 Standing tband green rows 2 x 10 Standing tband green gh ext 2 x 10 Seated pulleys flexion, scaption without stretched arm 2 mins each way    Manual:  PROM of Rt shoulder to pt's tolerance  TODAY'S TREATMENT:                                                                                                       DATE:  12/21/2022 Therex: Supine: shoulder flexion c 1 #  bar  2 x 10  Supine ER of Rt shoulder c 1 # bar 2 x 10 Bicep curls c 1 # weight x 15  Supine cervical rotation x 3 to each side holding 5 sec Supine cervical retraction x 5 holding 5 sec Standing pendulums: flexion x 2 minutes  Manual:  PROM of Rt shoulder to pt's tolerance  TODAY'S TREATMENT:  DATE:12/15/2022 Therex:    HEP instruction/performance c cues for techniques, handout provided.  Trial set performed of each for comprehension and symptom assessment.  See below for exercise list   PATIENT EDUCATION: 12/15/2022 Education details: HEP, POC Person educated: Patient Education method: Explanation, Demonstration, Verbal cues, and Handouts Education comprehension: verbalized understanding, returned demonstration, and verbal cues required  HOME EXERCISE PROGRAM: Access Code: [email or web address] URL: [email or web address] Date: 01/04/2023 Prepared by: Chyrel Masson  Exercises - Seated Scapular Retraction  - 3-5 x daily - 7 x weekly - 1 sets - 10 reps - 3-5 hold - Supine Shoulder Flexion Extension AAROM with Dowel  - 1-2 x daily - 7 x weekly - 1-2 sets - 10-15 reps - 3 hold - Supine Shoulder Flexion Extension Full Range AROM  - 1-2 x daily - 7 x weekly - 2-3 sets - 10-15 reps - Sidelying Shoulder Abduction Palm Forward  - 1-2 x daily - 7 x weekly - 2-3 sets - 10-15 reps - Sidelying Shoulder External Rotation (Mirrored)  - 1-2 x daily - 7 x weekly - 2-3 sets - 10-15 reps - Standing Bilateral Low Shoulder Row with Anchored Resistance  - 1-2 x daily - 7 x weekly - 2-3 sets - 10-15 reps - Shoulder Extension with Resistance  - 1-2 x daily - 7 x weekly - 1-2 sets - 10-15 reps  ASSESSMENT:  CLINICAL IMPRESSION: Recheck of AROM showed improvement overall in mobility.  Strength still to be improved as expected with additional AROM in gravity reduced positioning added today to HEP.   Continued skilled PT services warranted at this time to progression functional ability.   OBJECTIVE IMPAIRMENTS: decreased activity tolerance, decreased coordination, decreased endurance, decreased mobility, decreased ROM, decreased strength, increased edema, increased fascial restrictions, impaired perceived functional ability, impaired flexibility, impaired UE functional use, improper body mechanics, and pain.   ACTIVITY LIMITATIONS: carrying, lifting, sleeping, transfers, bed mobility, bathing, dressing, reach over head, and hygiene/grooming  PARTICIPATION LIMITATIONS: meal prep, cleaning, laundry, interpersonal relationship, driving, shopping, community activity, and occupation  PERSONAL FACTORS:  asthma, arthritis, DM, GERD HTN, glaucoma both eyes, abdominal hysterectomy, Rt THA  are also affecting patient's functional outcome.   REHAB POTENTIAL: Good  CLINICAL DECISION MAKING: Stable/uncomplicated  EVALUATION COMPLEXITY: Low   GOALS: Goals reviewed with patient? Yes  SHORT TERM GOALS: (target date for Short term goals are 3 weeks 01/05/2023)  1.Patient will demonstrate independent use of home exercise program to maintain progress from in clinic treatments. Goal status: Met  LONG TERM GOALS: (target dates for all long term goals are 10 weeks  02/23/2023 )   1. Patient will demonstrate/report pain at worst less than or equal to 2/10 to facilitate minimal limitation in daily activity secondary to pain symptoms. Goal status: on going 01/04/2023   2. Patient will demonstrate independent use of home exercise program to facilitate ability to maintain/progress functional gains from skilled physical therapy services. Goal status: on going 01/04/2023   3. Patient will demonstrate FOTO outcome > or = 59 % to indicate reduced disability due to condition. Goal status: on going 01/04/2023   4.  Patient will demonstrate Rt UE MMT > or = 4/5 throughout to facilitate lifting, reaching, carrying at  PLOF in daily activity.   Goal status: on going 01/04/2023   5.  Patient will demonstrate Rt GH joint AROM WFL s symptoms to facilitate usual overhead reaching, self care, dressing at PLOF.    Goal status: on going 01/04/2023   6.  Patient  will demonstrate/report return to work at [employer].  Goal status: on going 01/04/2023   PLAN:  PT FREQUENCY: 1-2x/week  PT DURATION: 10 weeks  PLANNED INTERVENTIONS: Therapeutic exercises, Therapeutic activity, Neuro Muscular re-education, Balance training, Gait training, Patient/Family education, Joint mobilization, Stair training, DME instructions, Dry Needling, Electrical stimulation, Traction, Cryotherapy, vasopneumatic device Moist heat, Taping, Ultrasound, Ionotophoresis 4mg /ml Dexamethasone, and aquatic therapy ,Manual therapy.  All included unless contraindicated  PLAN FOR NEXT SESSION: Gravity reduced AROM/AAROM for strength   Chyrel Masson, PT, DPT, OCS, ATC 01/04/23  4:31 PM

## 2023-01-06 ENCOUNTER — Encounter: Payer: BC Managed Care – PPO | Admitting: Rehabilitative and Restorative Service Providers"

## 2023-01-12 ENCOUNTER — Ambulatory Visit (INDEPENDENT_AMBULATORY_CARE_PROVIDER_SITE_OTHER): Payer: BC Managed Care – PPO | Admitting: Rehabilitative and Restorative Service Providers"

## 2023-01-12 ENCOUNTER — Encounter: Payer: Self-pay | Admitting: Rehabilitative and Restorative Service Providers"

## 2023-01-12 DIAGNOSIS — M25611 Stiffness of right shoulder, not elsewhere classified: Secondary | ICD-10-CM

## 2023-01-12 DIAGNOSIS — R600 Localized edema: Secondary | ICD-10-CM

## 2023-01-12 DIAGNOSIS — M25511 Pain in right shoulder: Secondary | ICD-10-CM

## 2023-01-12 DIAGNOSIS — M6281 Muscle weakness (generalized): Secondary | ICD-10-CM | POA: Diagnosis not present

## 2023-01-12 DIAGNOSIS — G8929 Other chronic pain: Secondary | ICD-10-CM

## 2023-01-12 NOTE — Therapy (Signed)
OUTPATIENT PHYSICAL THERAPY    Patient Name: [name] MRN: [id number] DOB:April 15, 1956, 67 y.o., female Today's Date: 01/12/2023  END OF SESSION:  PT End of Session - 01/12/23 1549     Visit Number 5    Number of Visits 20    Date for PT Re-Evaluation 02/23/23    Authorization Type [employer] [id number]    Progress Note Due on Visit 10    PT Start Time 1548    PT Stop Time 1627    PT Time Calculation (min) 39 min    Activity Tolerance Patient tolerated treatment well    Behavior During Therapy [facility] for tasks assessed/performed                Past Medical History:  Diagnosis Date   Anemia 2006   required transfusion post TAH/BSO 05/2005   Arthritis    Asthma    Chronic cough    Colon polyps    hyperplastic   Diabetes mellitus without complication (HCC)    Diverticulosis    GERD (gastroesophageal reflux disease)    on prilosec   GLA deficiency (HCC)    Glaucoma of both eyes    Hypertension    Joint pain    Osteoarthritis    Pancreatitis    Shortness of breath dyspnea    Stroke [facility])    TIA 2022   Vitamin D deficiency    Past Surgical History:  Procedure Laterality Date   BICEPT TENODESIS Right 11/19/2022   Procedure: BICEPS TENODESIS;  Surgeon: Cammy Copa, MD;  Location: [facility] OR;  Service: Orthopedics;  Laterality: Right;   BREAST EXCISIONAL BIOPSY Right 1990   COLONOSCOPY N/A 08/22/2013   Procedure: COLONOSCOPY;  Surgeon: Hilarie Fredrickson, MD;  Location: [facility] ENDOSCOPY;  Service: Endoscopy;  Laterality: N/A;   ESOPHAGOGASTRODUODENOSCOPY N/A 08/22/2013   Procedure: ESOPHAGOGASTRODUODENOSCOPY (EGD);  Surgeon: Hilarie Fredrickson, MD;  Location: [facility] ENDOSCOPY;  Service: Endoscopy;  Laterality: N/A;   ESOPHAGOGASTRODUODENOSCOPY (EGD) WITH PROPOFOL N/A 08/19/2015   Procedure: ESOPHAGOGASTRODUODENOSCOPY (EGD) WITH PROPOFOL;  Surgeon: Hilarie Fredrickson, MD;  Location: [facility] ENDOSCOPY;  Service: Endoscopy;  Laterality: N/A;   HYSTEROSCOPY WITH D & C  01/2005   for uterine fibroids.     INSERTION OF MESH N/A 08/24/2013   Procedure: INSERTION OF MESH;  Surgeon: Mariella Saa, MD;  Location: [facility] OR;  Service: General;  Laterality: N/A;   LIPOMA EXCISION Left 06/21/2015   Procedure: EXCISION OF LEFT SCALP LIPOMA;  Surgeon: Luretha Murphy, MD;  Location: [facility];  Service: General;  Laterality: Left;   PANNICULECTOMY N/A 08/24/2013   Procedure: PANNICULECTOMY;  Surgeon: Mariella Saa, MD;  Location: [facility] OR;  Service: General;  Laterality: N/A;   REVERSE SHOULDER ARTHROPLASTY Right 11/19/2022   Procedure: REVERSE SHOULDER ARTHROPLASTY, LIPOMA REMOVAL;  Surgeon: Cammy Copa, MD;  Location: [facility] OR;  Service: Orthopedics;  Laterality: Right;   TOTAL ABDOMINAL HYSTERECTOMY W/ BILATERAL SALPINGOOPHORECTOMY  05/2005   TOTAL HIP ARTHROPLASTY Right 12/12/2013   Procedure: RIGHT TOTAL HIP ARTHROPLASTY ANTERIOR APPROACH;  Surgeon: Kathryne Hitch, MD;  Location: [facility] OR;  Service: Orthopedics;  Laterality: Right;   VENTRAL HERNIA REPAIR N/A 08/24/2013   Procedure: HERNIA REPAIR VENTRAL ADULT;  Surgeon: Mariella Saa, MD;  Location: [facility] OR;  Service: General;  Laterality: N/A;   Patient Active Problem List   Diagnosis Date Noted   Arthritis of right shoulder region 11/22/2022   Biceps tendonitis on right 11/22/2022   Lipoma of right  shoulder 11/22/2022   S/P reverse total shoulder arthroplasty, right 11/19/2022   Poorly controlled persistent asthma 02/27/2022   Pain in right shoulder 02/09/2022   Asthma exacerbation 09/19/2020   Anemia 10/13/2019   Glaucoma 10/13/2019   Hypertensive disorder 10/13/2019   Uterine leiomyoma 10/13/2019   Vitamin D insufficiency 04/26/2019   TIA (transient ischemic attack) 03/30/2019   Hyperlipidemia 03/30/2019   Thyroid nodule 03/30/2019   Stroke-like episode s/p IV tPA 03/28/2019   GERD (gastroesophageal reflux disease) 06/19/2018   Constipation 06/19/2018   Other fatigue 08/02/2017   Shortness of  breath on exertion 08/02/2017   Mass of axilla 07/07/2017   Abdominal pain, epigastric    Abnormal CT of the abdomen    Pancreatitis, acute    Acute pancreatitis 08/13/2015   UTI (lower urinary tract infection) 08/13/2015   Severe obesity (BMI >= 40) (HCC) 01/23/2015   Sinusitis, chronic 01/08/2015   Cough variant asthma 01/03/2015   Arthritis of right hip 12/12/2013   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   Benign neoplasm of colon 08/22/2013   Special screening for malignant neoplasms, colon 08/22/2013   Abdominal pain 08/19/2013   Diabetes mellitus without complication (HCC) 08/19/2013   Essential hypertension, benign 08/19/2013   Ventral hernia 08/19/2013    PCP: Tally Joe MD  REFERRING PROVIDER: Julieanne Cotton, PA-C  REFERRING DIAG: [phone] (ICD-10-CM) - S/P reverse total shoulder arthroplasty, right  THERAPY DIAG:  Chronic right shoulder pain  Muscle weakness (generalized)  Stiffness of right shoulder, not elsewhere classified  Localized edema  Rationale for Evaluation and Treatment: Rehabilitation  ONSET DATE: Surgery 11/19/2022 Rt reverse TSA  SUBJECTIVE:                                                                                                                                                                                      SUBJECTIVE STATEMENT: She indicated feeling tired overall.  She reported some pain today and took muscle relaxer.  Had a sharp pain the other day insidiously, lasting about 15 mins.   PERTINENT HISTORY: asthma, arthritis, DM, GERD HTN, glaucoma both eyes, abdominal hysterectomy, Rt THA  PAIN:  NPRS scale: 5/10 upon arrival.  Pain location: Rt shoulder  Pain description: pressure, achy Aggravating factors: reaching, lifting, nighttime, writing/typing.  Relieving factors: muscle relaxer  PRECAUTIONS: Shoulder Rt reverse TSA - limit IR strengthening, ER mobility in short term.   WEIGHT BEARING RESTRICTIONS:   Rt reverse TSA  FALLS:  Has patient fallen in last 6 months? No  LIVING ENVIRONMENT: Lives with: lives with their family Lives in: House/apartment Stairs: 2 stairs to enter.   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  PLOF: Independent, Rt hand dominant, typing on computer, reports .  PATIENT GOALS:Reduce pain, use arm better, comb hair.   OBJECTIVE:   PATIENT SURVEYS:  01/04/2023: FOTO update: 50  12/15/2022 FOTO intake: 36    predicted:  59  COGNITION: 12/15/2022 Overall cognitive status: WFL     SENSATION: 12/15/2022 No specific testing today  POSTURE: 12/15/2022 Rounded shoulders noted, increased high guard on Rt arm in sitting.   UPPER EXTREMITY MMT:   12/15/2022: No formal testing for Rt shoulder due to recent surgery.  MMT Right 12/15/2022 Left 12/15/2022 Right 01/04/2023   Shoulder flexion  5/5 3+/5  Shoulder extension     Shoulder abduction  4+/5 3+/5  Shoulder adduction     Shoulder internal rotation  5/5   Shoulder external rotation  5/5   Elbow flexion  5/5   Elbow extension  5/5   Wrist flexion     Wrist extension     Wrist ulnar deviation     Wrist radial deviation     Wrist pronation     Wrist supination     (Blank rows = not tested)  UPPER EXTREMITY ROM:  12/15/2022:  Pain limited in all directions Rt shoulder.   ROM Right 12/15/2022 Left 12/15/2022 Right 12/23/2022 Right 01/04/2023 Right 01/12/2023  Shoulder flexion 115 AROM (120 PROM) In supine  125 PROM AROM against gravity 110 c mild shrug   138 AROM in supine  AAROM wand in supine  140  AROM against gravity to 100 deg without shrug noted today  Shoulder extension       Shoulder abduction 75 AROM (75 PROM) In supine  100 PROM 118 AROM in supine    Shoulder adduction       Shoulder internal rotation 42 PROM in 20 deg abduction supine      Shoulder external rotation 30 PROM in 20 deg abduction supine  30 PROM in 20 deg abduction supine Limited by post surgical protocol  58 AROM in 45 deg abduction in supine    Middle trapezius       Lower trapezius       Elbow flexion       Elbow extension       Wrist flexion       Wrist extension       Wrist ulnar deviation       Wrist radial deviation       Wrist pronation       Wrist supination       Grip strength (lbs)       (Blank rows = not tested)  SHOULDER SPECIAL TESTS: 12/15/2022: + shrug on Rt in elevation attempt   JOINT MOBILITY TESTING:  12/15/2022 No specific testing today  PALPATION:  12/15/2022 Tenderness around Rt shoulder incision  TODAY'S TREATMENT:                                                                                                       DATE:  01/12/2023 Therex: Seated pulleys flexion 3 mins, scaption 3 mins with outstretched arm for eccentric lowering focus Tband rows green 2 x 15 Tband GH ext green 2 x 15  Supine wand AAROM shoulder flexion 2 lb bar x 10  Supine AROM Rt shoulder flexion 2 x 10  Supine Rt shoulder small circles cw, ccw x 30 each way in 90 deg shoulder flexion  Lt sidelying Rt shoulder AROM abduction 2 x 10  Lt sidelying Rt shoulder AROM ER c towel under arm x 10  Instruction of ER isometric holds for times where active ER doesn't feel as good.    TODAY'S TREATMENT:                                                                                                       DATE:  01/04/2023 Therex: Supine AROM Rt shoulder flexion x 15  Lt sidelying Rt shoulder AROM abduction x 15  Lt sidelying Rt shoulder AROM ER c towel under arm x 15 Standing  AAROM flexion 1 lb wand to tolerance x 10 , watching shrug sign Seated pulleys flexion 3 mins, scaption 2 mins with outstretched arm Tband rows green 2 x 10 Tband GH ext green 2 x 10   Added and adjusted HEP as listed below to  include new exercises above.    TODAY'S TREATMENT:                                                                                                       DATE:  12/23/2022 Therex: UBE fwd/back 3 mins each way with 30 sec rest rest between, lvl 1 Supine AAROM wand flexion 1 lb bar x 10 Supine AROM Rt arm 2 x 10  Supine 1 lb wand AAROM protraction 3 sec hold x 10 Standing tband green rows 2 x 10 Standing tband green gh ext 2 x 10 Seated pulleys flexion, scaption without stretched arm 2 mins each way    Manual:  PROM of Rt shoulder to pt's tolerance  TODAY'S TREATMENT:  DATE:  12/21/2022 Therex: Supine: shoulder flexion c 1 # bar  2 x 10  Supine ER of Rt shoulder c 1 # bar 2 x 10 Bicep curls c 1 # weight x 15  Supine cervical rotation x 3 to each side holding 5 sec Supine cervical retraction x 5 holding 5 sec Standing pendulums: flexion x 2 minutes  Manual:  PROM of Rt shoulder to pt's tolerance  TODAY'S TREATMENT:                                                                                                       DATE:12/15/2022 Therex:    HEP instruction/performance c cues for techniques, handout provided.  Trial set performed of each for comprehension and symptom assessment.  See below for exercise list   PATIENT EDUCATION: 12/15/2022 Education details: HEP, POC Person educated: Patient Education method: Explanation, Demonstration, Verbal cues, and Handouts Education comprehension: verbalized understanding, returned demonstration, and verbal cues required  HOME EXERCISE PROGRAM: Access Code: [email or web address] URL: [email or web address] Date: 01/12/2023 Prepared by: Chyrel Masson  Exercises - Seated Scapular Retraction  - 3-5 x daily - 7 x weekly - 1 sets - 10 reps - 3-5 hold - Supine Shoulder Flexion Extension AAROM with Dowel  - 1-2 x daily - 7 x weekly - 1-2 sets -  10-15 reps - 3 hold - Supine Shoulder Flexion Extension Full Range AROM  - 1-2 x daily - 7 x weekly - 2-3 sets - 10-15 reps - Sidelying Shoulder Abduction Palm Forward  - 1-2 x daily - 7 x weekly - 2-3 sets - 10-15 reps - Sidelying Shoulder External Rotation (Mirrored)  - 1-2 x daily - 7 x weekly - 2-3 sets - 10-15 reps - Standing Bilateral Low Shoulder Row with Anchored Resistance  - 1-2 x daily - 7 x weekly - 2-3 sets - 10-15 reps - Shoulder Extension with Resistance  - 1-2 x daily - 7 x weekly - 1-2 sets - 10-15 reps - Standing Isometric Shoulder External Rotation with Doorway (Mirrored)  - 1 x daily - 7 x weekly - 1 sets - 10 reps - 5 hold  ASSESSMENT:  CLINICAL IMPRESSION: Pt continued to show challenge and difficulty c active movements in gravity reduced positioning.  Encouraged continued work at home to address and improve strength and muscle recruitment. Continued skilled Pt services indicated.  Pt noted quick pain response in active ER against gravity but not on every rep.  Instructed to rest from exercise if necessary and use isometric painfree holds on those instances.   OBJECTIVE IMPAIRMENTS: decreased activity tolerance, decreased coordination, decreased endurance, decreased mobility, decreased ROM, decreased strength, increased edema, increased fascial restrictions, impaired perceived functional ability, impaired flexibility, impaired UE functional use, improper body mechanics, and pain.   ACTIVITY LIMITATIONS: carrying, lifting, sleeping, transfers, bed mobility, bathing, dressing, reach over head, and hygiene/grooming  PARTICIPATION LIMITATIONS: meal prep, cleaning, laundry, interpersonal relationship, driving, shopping, community activity, and occupation  PERSONAL FACTORS:  asthma, arthritis, DM, GERD HTN, glaucoma both eyes, abdominal hysterectomy, Rt THA  are also affecting patient's functional  outcome.   REHAB POTENTIAL: Good  CLINICAL DECISION MAKING:  Stable/uncomplicated  EVALUATION COMPLEXITY: Low   GOALS: Goals reviewed with patient? Yes  SHORT TERM GOALS: (target date for Short term goals are 3 weeks 01/05/2023)  1.Patient will demonstrate independent use of home exercise program to maintain progress from in clinic treatments. Goal status: Met  LONG TERM GOALS: (target dates for all long term goals are 10 weeks  02/23/2023 )   1. Patient will demonstrate/report pain at worst less than or equal to 2/10 to facilitate minimal limitation in daily activity secondary to pain symptoms. Goal status: on going 01/04/2023   2. Patient will demonstrate independent use of home exercise program to facilitate ability to maintain/progress functional gains from skilled physical therapy services. Goal status: on going 01/04/2023   3. Patient will demonstrate FOTO outcome > or = 59 % to indicate reduced disability due to condition. Goal status: on going 01/04/2023   4.  Patient will demonstrate Rt UE MMT > or = 4/5 throughout to facilitate lifting, reaching, carrying at PLOF in daily activity.   Goal status: on going 01/04/2023   5.  Patient will demonstrate Rt GH joint AROM WFL s symptoms to facilitate usual overhead reaching, self care, dressing at PLOF.    Goal status: on going 01/04/2023   6.  Patient will demonstrate/report return to work at [employer].  Goal status: on going 01/04/2023   PLAN:  PT FREQUENCY: 1-2x/week  PT DURATION: 10 weeks  PLANNED INTERVENTIONS: Therapeutic exercises, Therapeutic activity, Neuro Muscular re-education, Balance training, Gait training, Patient/Family education, Joint mobilization, Stair training, DME instructions, Dry Needling, Electrical stimulation, Traction, Cryotherapy, vasopneumatic device Moist heat, Taping, Ultrasound, Ionotophoresis 4mg /ml Dexamethasone, and aquatic therapy ,Manual therapy.  All included unless contraindicated  PLAN FOR NEXT SESSION: Slow progression of AROM strengthening.     Chyrel Masson, PT, DPT, OCS, ATC 01/12/23  4:27 PM

## 2023-01-14 ENCOUNTER — Encounter: Payer: BC Managed Care – PPO | Admitting: Rehabilitative and Restorative Service Providers"

## 2023-01-14 ENCOUNTER — Telehealth: Payer: Self-pay | Admitting: Rehabilitative and Restorative Service Providers"

## 2023-01-14 NOTE — Telephone Encounter (Signed)
Called after 15 mins no show for appointment.  Pt indicated she got the days wrong.  She planned to call back to schedule future appointments.   Chyrel Masson, PT, DPT, OCS, ATC 01/14/23  4:17 PM

## 2023-01-22 ENCOUNTER — Ambulatory Visit: Payer: BC Managed Care – PPO | Admitting: Podiatry

## 2023-01-28 ENCOUNTER — Other Ambulatory Visit: Payer: Self-pay | Admitting: Surgical

## 2023-01-28 ENCOUNTER — Encounter: Payer: Self-pay | Admitting: Rehabilitative and Restorative Service Providers"

## 2023-01-28 ENCOUNTER — Ambulatory Visit (INDEPENDENT_AMBULATORY_CARE_PROVIDER_SITE_OTHER): Payer: BC Managed Care – PPO | Admitting: Rehabilitative and Restorative Service Providers"

## 2023-01-28 DIAGNOSIS — M6281 Muscle weakness (generalized): Secondary | ICD-10-CM

## 2023-01-28 DIAGNOSIS — R600 Localized edema: Secondary | ICD-10-CM | POA: Diagnosis not present

## 2023-01-28 DIAGNOSIS — M25511 Pain in right shoulder: Secondary | ICD-10-CM | POA: Diagnosis not present

## 2023-01-28 DIAGNOSIS — G8929 Other chronic pain: Secondary | ICD-10-CM

## 2023-01-28 DIAGNOSIS — M25611 Stiffness of right shoulder, not elsewhere classified: Secondary | ICD-10-CM

## 2023-01-28 MED ORDER — OXYCODONE-ACETAMINOPHEN 5-325 MG PO TABS: 1.0000 | ORAL_TABLET | Freq: Two times a day (BID) | ORAL | 0 refills | Status: DC | PRN

## 2023-01-28 NOTE — Therapy (Addendum)
OUTPATIENT PHYSICAL THERAPY / DISCHARGE   Patient Name: [name] MRN: [id number] DOB:04-Feb-1956, 67 y.o., female Today's Date: 01/28/2023  END OF SESSION:  PT End of Session - 01/28/23 0915     Visit Number 6    Number of Visits 20    Date for PT Re-Evaluation 02/23/23    Authorization Type [employer] [id number]    Progress Note Due on Visit 10    PT Start Time 0918    PT Stop Time 0957    PT Time Calculation (min) 39 min    Activity Tolerance Patient limited by fatigue    Behavior During Therapy [facility] for tasks assessed/performed                 Past Medical History:  Diagnosis Date   Anemia 2006   required transfusion post TAH/BSO 05/2005   Arthritis    Asthma    Chronic cough    Colon polyps    hyperplastic   Diabetes mellitus without complication (HCC)    Diverticulosis    GERD (gastroesophageal reflux disease)    on prilosec   GLA deficiency (HCC)    Glaucoma of both eyes    Hypertension    Joint pain    Osteoarthritis    Pancreatitis    Shortness of breath dyspnea    Stroke [facility])    TIA 2022   Vitamin D deficiency    Past Surgical History:  Procedure Laterality Date   BICEPT TENODESIS Right 11/19/2022   Procedure: BICEPS TENODESIS;  Surgeon: Cammy Copa, MD;  Location: [facility] OR;  Service: Orthopedics;  Laterality: Right;   BREAST EXCISIONAL BIOPSY Right 1990   COLONOSCOPY N/A 08/22/2013   Procedure: COLONOSCOPY;  Surgeon: Hilarie Fredrickson, MD;  Location: [facility] ENDOSCOPY;  Service: Endoscopy;  Laterality: N/A;   ESOPHAGOGASTRODUODENOSCOPY N/A 08/22/2013   Procedure: ESOPHAGOGASTRODUODENOSCOPY (EGD);  Surgeon: Hilarie Fredrickson, MD;  Location: [facility] ENDOSCOPY;  Service: Endoscopy;  Laterality: N/A;   ESOPHAGOGASTRODUODENOSCOPY (EGD) WITH PROPOFOL N/A 08/19/2015   Procedure: ESOPHAGOGASTRODUODENOSCOPY (EGD) WITH PROPOFOL;  Surgeon: Hilarie Fredrickson, MD;  Location: [facility] ENDOSCOPY;  Service: Endoscopy;  Laterality: N/A;   HYSTEROSCOPY WITH D & C  01/2005   for uterine  fibroids.    INSERTION OF MESH N/A 08/24/2013   Procedure: INSERTION OF MESH;  Surgeon: Mariella Saa, MD;  Location: [facility] OR;  Service: General;  Laterality: N/A;   LIPOMA EXCISION Left 06/21/2015   Procedure: EXCISION OF LEFT SCALP LIPOMA;  Surgeon: Luretha Murphy, MD;  Location: [facility];  Service: General;  Laterality: Left;   PANNICULECTOMY N/A 08/24/2013   Procedure: PANNICULECTOMY;  Surgeon: Mariella Saa, MD;  Location: [facility] OR;  Service: General;  Laterality: N/A;   REVERSE SHOULDER ARTHROPLASTY Right 11/19/2022   Procedure: REVERSE SHOULDER ARTHROPLASTY, LIPOMA REMOVAL;  Surgeon: Cammy Copa, MD;  Location: [facility] OR;  Service: Orthopedics;  Laterality: Right;   TOTAL ABDOMINAL HYSTERECTOMY W/ BILATERAL SALPINGOOPHORECTOMY  05/2005   TOTAL HIP ARTHROPLASTY Right 12/12/2013   Procedure: RIGHT TOTAL HIP ARTHROPLASTY ANTERIOR APPROACH;  Surgeon: Kathryne Hitch, MD;  Location: [facility] OR;  Service: Orthopedics;  Laterality: Right;   VENTRAL HERNIA REPAIR N/A 08/24/2013   Procedure: HERNIA REPAIR VENTRAL ADULT;  Surgeon: Mariella Saa, MD;  Location: [facility] OR;  Service: General;  Laterality: N/A;   Patient Active Problem List   Diagnosis Date Noted   Arthritis of right shoulder region 11/22/2022   Biceps tendonitis on right 11/22/2022   Lipoma  of right shoulder 11/22/2022   S/P reverse total shoulder arthroplasty, right 11/19/2022   Poorly controlled persistent asthma 02/27/2022   Pain in right shoulder 02/09/2022   Asthma exacerbation 09/19/2020   Anemia 10/13/2019   Glaucoma 10/13/2019   Hypertensive disorder 10/13/2019   Uterine leiomyoma 10/13/2019   Vitamin D insufficiency 04/26/2019   TIA (transient ischemic attack) 03/30/2019   Hyperlipidemia 03/30/2019   Thyroid nodule 03/30/2019   Stroke-like episode s/p IV tPA 03/28/2019   GERD (gastroesophageal reflux disease) 06/19/2018   Constipation 06/19/2018   Other fatigue 08/02/2017    Shortness of breath on exertion 08/02/2017   Mass of axilla 07/07/2017   Abdominal pain, epigastric    Abnormal CT of the abdomen    Pancreatitis, acute    Acute pancreatitis 08/13/2015   UTI (lower urinary tract infection) 08/13/2015   Severe obesity (BMI >= 40) (HCC) 01/23/2015   Sinusitis, chronic 01/08/2015   Cough variant asthma 01/03/2015   Arthritis of right hip 12/12/2013   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   Benign neoplasm of colon 08/22/2013   Special screening for malignant neoplasms, colon 08/22/2013   Abdominal pain 08/19/2013   Diabetes mellitus without complication (HCC) 08/19/2013   Essential hypertension, benign 08/19/2013   Ventral hernia 08/19/2013    PCP: Tally Joe MD  REFERRING PROVIDER: Julieanne Cotton, PA-C  REFERRING DIAG: [phone] (ICD-10-CM) - S/P reverse total shoulder arthroplasty, right  THERAPY DIAG:  Chronic right shoulder pain  Muscle weakness (generalized)  Stiffness of right shoulder, not elsewhere classified  Localized edema  Rationale for Evaluation and Treatment: Rehabilitation  ONSET DATE: Surgery 11/19/2022 Rt reverse TSA  SUBJECTIVE:                                                                                                                                                                                      SUBJECTIVE STATEMENT: Pt indicated having increased symptoms in last couple days.  Reported 8/10 upon arrival today.  Though weather might be part of it.  She indicated last week having a sharp pain every now and then but nothing consistent.   Noticed evening trouble.   PERTINENT HISTORY: asthma, arthritis, DM, GERD HTN, glaucoma both eyes, abdominal hysterectomy, Rt THA  PAIN:  NPRS scale:  8/10 Pain location: Rt shoulder  Pain description: pressure, achy Aggravating factors: reaching, lifting, nighttime, writing/typing.  Relieving factors: muscle relaxer  PRECAUTIONS: Shoulder Rt reverse TSA  - limit IR strengthening, ER mobility in short term.   WEIGHT BEARING RESTRICTIONS:  Rt reverse TSA  FALLS:  Has patient fallen in last 6 months? No  LIVING ENVIRONMENT: Lives with: lives with their family  Lives in: House/apartment Stairs: 2 stairs to enter.   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   PLOF: Independent, Rt hand dominant, typing on computer, reports .  PATIENT GOALS:Reduce pain, use arm better, comb hair.   OBJECTIVE:   PATIENT SURVEYS:  01/04/2023: FOTO update: 50  12/15/2022 FOTO intake: 36    predicted:  59  COGNITION: 12/15/2022 Overall cognitive status: WFL     SENSATION: 12/15/2022 No specific testing today  POSTURE: 12/15/2022 Rounded shoulders noted, increased high guard on Rt arm in sitting.   UPPER EXTREMITY MMT:   12/15/2022: No formal testing for Rt shoulder due to recent surgery.  MMT Right 12/15/2022 Left 12/15/2022 Right 01/04/2023   Shoulder flexion  5/5 3+/5  Shoulder extension     Shoulder abduction  4+/5 3+/5  Shoulder adduction     Shoulder internal rotation  5/5   Shoulder external rotation  5/5   Elbow flexion  5/5   Elbow extension  5/5   Wrist flexion     Wrist extension     Wrist ulnar deviation     Wrist radial deviation     Wrist pronation     Wrist supination     (Blank rows = not tested)  UPPER EXTREMITY ROM:  12/15/2022:  Pain limited in all directions Rt shoulder.   ROM Right 12/15/2022 Left 12/15/2022 Right 12/23/2022 Right 01/04/2023 Right 01/12/2023 Right 01/28/2023  Shoulder flexion 115 AROM (120 PROM) In supine  125 PROM AROM against gravity 110 c mild shrug   138 AROM in supine  AAROM wand in supine  140  AROM against gravity to 100 deg without shrug noted today AROM against gravity 130 deg with elbow bend noted in end range for compensation.   Shoulder extension        Shoulder abduction 75 AROM (75 PROM) In supine  100 PROM 118 AROM in supine     Shoulder adduction        Shoulder  internal rotation 42 PROM in 20 deg abduction supine       Shoulder external rotation 30 PROM in 20 deg abduction supine  30 PROM in 20 deg abduction supine Limited by post surgical protocol 58 AROM in 45 deg abduction in supine     Middle trapezius        Lower trapezius        Elbow flexion        Elbow extension        Wrist flexion        Wrist extension        Wrist ulnar deviation        Wrist radial deviation        Wrist pronation        Wrist supination        Grip strength (lbs)        (Blank rows = not tested)  SHOULDER SPECIAL TESTS: 12/15/2022: + shrug on Rt in elevation attempt   JOINT MOBILITY TESTING:  12/15/2022 No specific testing today  PALPATION:  12/15/2022 Tenderness around Rt shoulder incision  TODAY'S TREATMENT:                                                                                                       DATE:  01/28/2023 Therex: Seated pulleys flexion 3 mins, scaption 3 mins to tolerance level  Supine Rt shoulder small circles cw, ccw x 30 each way in 90 deg shoulder flexion  Supine Rt shoulder hold to fatigue in 90 deg flexion 2 lb  -  90 seconds  Supine AROM Rt shoulder flexion 1:40 seconds, approx. 20 reps prior to fatigue, 13 reps and 1 min Supine bilateral shoulder ER c scap retraction hold 2-3 seconds 2 x 10 with red band Standing wall push up x 15     TODAY'S TREATMENT:                                                                                                       DATE:  01/12/2023 Therex: Seated pulleys flexion 3 mins, scaption 3 mins with outstretched arm for eccentric lowering focus Tband rows green 2 x 15 Tband GH ext green 2 x 15  Supine wand AAROM shoulder flexion 2 lb bar x 10  Supine AROM Rt shoulder flexion 2 x 10  Supine Rt  shoulder small circles cw, ccw x 30 each way in 90 deg shoulder flexion  Lt sidelying Rt shoulder AROM abduction 2 x 10  Lt sidelying Rt shoulder AROM ER c towel under arm x 10  Instruction of ER isometric holds for times where active ER doesn't feel as good.    TODAY'S TREATMENT:                                                                                                       DATE:  01/04/2023 Therex: Supine AROM Rt shoulder flexion x 15  Lt sidelying Rt shoulder AROM abduction x 15  Lt sidelying Rt shoulder AROM ER c towel under arm x 15 Standing  AAROM flexion 1 lb wand to tolerance x 10 , watching shrug sign Seated pulleys flexion 3 mins, scaption 2 mins with outstretched arm Tband rows green 2 x 10 Tband GH ext green 2 x 10   Added and adjusted HEP as listed below to  include new exercises above.    TODAY'S TREATMENT:                                                                                                       DATE:  12/23/2022 Therex: UBE fwd/back 3 mins each way with 30 sec rest rest between, lvl 1 Supine AAROM wand flexion 1 lb bar x 10 Supine AROM Rt arm 2 x 10  Supine 1 lb wand AAROM protraction 3 sec hold x 10 Standing tband green rows 2 x 10 Standing tband green gh ext 2 x 10 Seated pulleys flexion, scaption without stretched arm 2 mins each way    Manual:  PROM of Rt shoulder to pt's tolerance   PATIENT EDUCATION: 12/15/2022 Education details: HEP, POC Person educated: Patient Education method: Programmer, multimedia, Demonstration, Verbal cues, and Handouts Education comprehension: verbalized understanding, returned demonstration, and verbal cues required  HOME EXERCISE PROGRAM: Access Code: [email or web address] URL: [email or web address] Date: 01/12/2023 Prepared by: Chyrel Masson  Exercises - Seated Scapular Retraction  - 3-5 x daily - 7 x weekly - 1 sets - 10 reps - 3-5 hold - Supine Shoulder Flexion Extension AAROM with Dowel  - 1-2 x daily - 7 x  weekly - 1-2 sets - 10-15 reps - 3 hold - Supine Shoulder Flexion Extension Full Range AROM  - 1-2 x daily - 7 x weekly - 2-3 sets - 10-15 reps - Sidelying Shoulder Abduction Palm Forward  - 1-2 x daily - 7 x weekly - 2-3 sets - 10-15 reps - Sidelying Shoulder External Rotation (Mirrored)  - 1-2 x daily - 7 x weekly - 2-3 sets - 10-15 reps - Standing Bilateral Low Shoulder Row with Anchored Resistance  - 1-2 x daily - 7 x weekly - 2-3 sets - 10-15 reps - Shoulder Extension with Resistance  - 1-2 x daily - 7 x weekly - 1-2 sets - 10-15 reps - Standing Isometric Shoulder External Rotation with Doorway (Mirrored)  - 1 x daily - 7 x weekly - 1 sets - 10 reps - 5 hold  ASSESSMENT:  CLINICAL IMPRESSION: Arrival with more resting symptoms noted.  Continued to focus on building endurance and strength in active range movements to help improve functional reaching activity.  Symptom presentation continued to required adjustment of intervention difficulty level.   OBJECTIVE IMPAIRMENTS: decreased activity tolerance, decreased coordination, decreased endurance, decreased mobility, decreased ROM, decreased strength, increased edema, increased fascial restrictions, impaired perceived functional ability, impaired flexibility, impaired UE functional use, improper body mechanics, and pain.   ACTIVITY LIMITATIONS: carrying, lifting, sleeping, transfers, bed mobility, bathing, dressing, reach over head, and hygiene/grooming  PARTICIPATION LIMITATIONS: meal prep, cleaning, laundry, interpersonal relationship, driving, shopping, community activity, and occupation  PERSONAL FACTORS:  asthma, arthritis, DM, GERD HTN, glaucoma both eyes, abdominal hysterectomy, Rt THA  are also affecting patient's functional outcome.   REHAB POTENTIAL: Good  CLINICAL DECISION MAKING: Stable/uncomplicated  EVALUATION COMPLEXITY: Low   GOALS: Goals reviewed with patient? Yes  SHORT TERM GOALS: (target date for Short term goals  are 3 weeks 01/05/2023)  1.Patient will  demonstrate independent use of home exercise program to maintain progress from in clinic treatments. Goal status: Met  LONG TERM GOALS: (target dates for all long term goals are 10 weeks  02/23/2023 )   1. Patient will demonstrate/report pain at worst less than or equal to 2/10 to facilitate minimal limitation in daily activity secondary to pain symptoms. Goal status: on going 01/28/2023   2. Patient will demonstrate independent use of home exercise program to facilitate ability to maintain/progress functional gains from skilled physical therapy services. Goal status: on going 01/28/2023   3. Patient will demonstrate FOTO outcome > or = 59 % to indicate reduced disability due to condition. Goal status: on going 01/28/2023   4.  Patient will demonstrate Rt UE MMT > or = 4/5 throughout to facilitate lifting, reaching, carrying at PLOF in daily activity.   Goal status: on going 01/28/2023   5.  Patient will demonstrate Rt GH joint AROM WFL s symptoms to facilitate usual overhead reaching, self care, dressing at PLOF.    Goal status: on going 01/28/2023   6.  Patient will demonstrate/report return to work at [employer].  Goal status: on going 01/28/2023   PLAN:  PT FREQUENCY: 1-2x/week  PT DURATION: 10 weeks  PLANNED INTERVENTIONS: Therapeutic exercises, Therapeutic activity, Neuro Muscular re-education, Balance training, Gait training, Patient/Family education, Joint mobilization, Stair training, DME instructions, Dry Needling, Electrical stimulation, Traction, Cryotherapy, vasopneumatic device Moist heat, Taping, Ultrasound, Ionotophoresis 4mg /ml Dexamethasone, and aquatic therapy ,Manual therapy.  All included unless contraindicated  PLAN FOR NEXT SESSION: Slow progression of AROM strengthening as tolerated.     Chyrel Masson, PT, DPT, OCS, ATC 01/28/23  9:57 AM  PHYSICAL THERAPY DISCHARGE SUMMARY  Visits from Start of Care: 6  Current functional level  related to goals / functional outcomes: See note   Remaining deficits: See note   Education / Equipment: HEP  Patient goals were partially met. Patient is being discharged due to not returning since the last visit.   Chyrel Masson, PT, DPT, OCS, ATC 03/03/23  3:19 PM

## 2023-01-30 ENCOUNTER — Encounter: Payer: Self-pay | Admitting: Orthopedic Surgery

## 2023-02-04 ENCOUNTER — Ambulatory Visit (INDEPENDENT_AMBULATORY_CARE_PROVIDER_SITE_OTHER): Payer: BC Managed Care – PPO | Admitting: Podiatry

## 2023-02-04 ENCOUNTER — Other Ambulatory Visit: Payer: Self-pay | Admitting: Podiatry

## 2023-02-04 ENCOUNTER — Ambulatory Visit (INDEPENDENT_AMBULATORY_CARE_PROVIDER_SITE_OTHER): Payer: BC Managed Care – PPO

## 2023-02-04 ENCOUNTER — Encounter: Payer: Self-pay | Admitting: Podiatry

## 2023-02-04 DIAGNOSIS — G629 Polyneuropathy, unspecified: Secondary | ICD-10-CM | POA: Diagnosis not present

## 2023-02-04 DIAGNOSIS — L603 Nail dystrophy: Secondary | ICD-10-CM

## 2023-02-04 DIAGNOSIS — M21619 Bunion of unspecified foot: Secondary | ICD-10-CM

## 2023-02-04 DIAGNOSIS — M21611 Bunion of right foot: Secondary | ICD-10-CM | POA: Diagnosis not present

## 2023-02-04 NOTE — Progress Notes (Signed)
Subjective:   Patient ID: [name], female   DOB: 67 y.o.   MRN: [id number]   HPI Chief Complaint  Patient presents with   Bunions    Bilateral bunions Pt stated I think I have neuropathy she stated she has a lot of numbness and tingling feels like something is always on her foot     Bunions started bothering her 1 year ago with the right > left. No injuries. She states when she wears certain shoes it does hurt. No swelling.   She has self diagnosed herself with neuropathy. It feels like there is always something in her shoe, like a gritty feeling and she gets numbness. This is mostly along the ball of the feet. This is on both feet and started 1 year ago. No treatment.   She is diabetic. Last time she checked her BS it was "like 120". Last A1c was 6.4 on 11/10/2022  No alcohol use.   No leg cramps with walking or claudication symptoms.    Review of Systems  All other systems reviewed and are negative.  Past Medical History:  Diagnosis Date   Anemia 2006   required transfusion post TAH/BSO 05/2005   Arthritis    Asthma    Chronic cough    Colon polyps    hyperplastic   Diabetes mellitus without complication (HCC)    Diverticulosis    GERD (gastroesophageal reflux disease)    on prilosec   GLA deficiency (HCC)    Glaucoma of both eyes    Hypertension    Joint pain    Osteoarthritis    Pancreatitis    Shortness of breath dyspnea    Stroke [facility])    TIA 2022   Vitamin D deficiency     Past Surgical History:  Procedure Laterality Date   BICEPT TENODESIS Right 11/19/2022   Procedure: BICEPS TENODESIS;  Surgeon: Cammy Copa, MD;  Location: [facility] OR;  Service: Orthopedics;  Laterality: Right;   BREAST EXCISIONAL BIOPSY Right 1990   COLONOSCOPY N/A 08/22/2013   Procedure: COLONOSCOPY;  Surgeon: Hilarie Fredrickson, MD;  Location: [facility] ENDOSCOPY;  Service: Endoscopy;  Laterality: N/A;   ESOPHAGOGASTRODUODENOSCOPY N/A 08/22/2013   Procedure: ESOPHAGOGASTRODUODENOSCOPY  (EGD);  Surgeon: Hilarie Fredrickson, MD;  Location: [facility] ENDOSCOPY;  Service: Endoscopy;  Laterality: N/A;   ESOPHAGOGASTRODUODENOSCOPY (EGD) WITH PROPOFOL N/A 08/19/2015   Procedure: ESOPHAGOGASTRODUODENOSCOPY (EGD) WITH PROPOFOL;  Surgeon: Hilarie Fredrickson, MD;  Location: [facility] ENDOSCOPY;  Service: Endoscopy;  Laterality: N/A;   HYSTEROSCOPY WITH D & C  01/2005   for uterine fibroids.    INSERTION OF MESH N/A 08/24/2013   Procedure: INSERTION OF MESH;  Surgeon: Mariella Saa, MD;  Location: [facility] OR;  Service: General;  Laterality: N/A;   LIPOMA EXCISION Left 06/21/2015   Procedure: EXCISION OF LEFT SCALP LIPOMA;  Surgeon: Luretha Murphy, MD;  Location: [facility];  Service: General;  Laterality: Left;   PANNICULECTOMY N/A 08/24/2013   Procedure: PANNICULECTOMY;  Surgeon: Mariella Saa, MD;  Location: [facility] OR;  Service: General;  Laterality: N/A;   REVERSE SHOULDER ARTHROPLASTY Right 11/19/2022   Procedure: REVERSE SHOULDER ARTHROPLASTY, LIPOMA REMOVAL;  Surgeon: Cammy Copa, MD;  Location: [facility] OR;  Service: Orthopedics;  Laterality: Right;   TOTAL ABDOMINAL HYSTERECTOMY W/ BILATERAL SALPINGOOPHORECTOMY  05/2005   TOTAL HIP ARTHROPLASTY Right 12/12/2013   Procedure: RIGHT TOTAL HIP ARTHROPLASTY ANTERIOR APPROACH;  Surgeon: Kathryne Hitch, MD;  Location: [facility] OR;  Service: Orthopedics;  Laterality:  Right;   VENTRAL HERNIA REPAIR N/A 08/24/2013   Procedure: HERNIA REPAIR VENTRAL ADULT;  Surgeon: Mariella Saa, MD;  Location: [facility] OR;  Service: General;  Laterality: N/A;     Current Outpatient Medications:    albuterol (PROVENTIL) (2.5 MG/3ML) 0.083% nebulizer solution, Take 3 mLs (2.5 mg total) by nebulization every 6 (six) hours as needed for wheezing or shortness of breath., Disp: 75 mL, Rfl: 0   aspirin EC 81 MG EC tablet, Take 1 tablet (81 mg total) by mouth daily. (Patient not taking: Reported on 11/09/2022), Disp:  , Rfl:    atorvastatin (LIPITOR) 20 MG tablet,  Take 1 tablet (20 mg total) by mouth daily at 6 PM., Disp: 30 tablet, Rfl: 2   budesonide-formoterol (SYMBICORT) 160-4.5 MCG/ACT inhaler, INHALE 2 PUFFS INTO THE LUNGS IN THE MORNING AND AT BEDTIME (Patient taking differently: Inhale 2 puffs into the lungs 2 (two) times daily as needed (shortness of breath).), Disp: 10.2 g, Rfl: 11   cetirizine (ZYRTEC ALLERGY) 10 MG tablet, Take 1 tablet (10 mg total) by mouth daily., Disp: 30 tablet, Rfl: 2   chlorpheniramine (CHLOR-TRIMETON) 4 MG tablet, Take 1 tablet (4 mg total) by mouth every 6 (six) hours as needed for allergies. (Patient not taking: Reported on 11/09/2022), Disp: 30 tablet, Rfl: 0   EPINEPHrine 0.3 mg/0.3 mL IJ SOAJ injection, Inject 0.3 mg into the muscle as needed for anaphylaxis. (Patient not taking: Reported on 11/09/2022), Disp: 1 each, Rfl: 2   FARXIGA 10 MG TABS tablet, Take 10 mg by mouth daily., Disp: , Rfl:    fluticasone (FLONASE) 50 MCG/ACT nasal spray, Place 1 spray into both nostrils daily. (Patient not taking: Reported on 11/09/2022), Disp: 15 mL, Rfl: 2   glucose blood test strip, Use as instructed, Disp: 100 each, Rfl: 1   MELATONIN PO, Take 1 tablet by mouth at bedtime as needed (sleep)., Disp: , Rfl:    metFORMIN (GLUCOPHAGE) 1000 MG tablet, Take 1,000 mg by mouth 2 (two) times daily with a meal., Disp: , Rfl:    methocarbamol (ROBAXIN) 500 MG tablet, Take 1 tablet (500 mg total) by mouth every 6 (six) hours as needed for muscle spasms., Disp: 40 tablet, Rfl: 0   methylPREDNISolone (MEDROL DOSEPAK) 4 MG TBPK tablet, Take as directed with food (Patient not taking: Reported on 11/09/2022), Disp: 21 tablet, Rfl: 0   montelukast (SINGULAIR) 10 MG tablet, TAKE 1 TABLET(10 MG) BY MOUTH AT BEDTIME, Disp: 30 tablet, Rfl: 11   omeprazole (PRILOSEC) 20 MG capsule, Take 1 capsule (20 mg total) by mouth in the morning and at bedtime. (Patient taking differently: Take 20 mg by mouth daily.), Disp: 60 capsule, Rfl: 2   oxyCODONE-acetaminophen  (PERCOCET) 5-325 MG tablet, Take 1 tablet by mouth every 12 (twelve) hours as needed for severe pain., Disp: 30 tablet, Rfl: 0   pioglitazone (ACTOS) 15 MG tablet, Take 15 mg by mouth daily., Disp: , Rfl:    sitaGLIPtin (JANUVIA) 50 MG tablet, Take 50 mg by mouth daily., Disp: , Rfl:    telmisartan (MICARDIS) 40 MG tablet, Take 40 mg by mouth daily., Disp: , Rfl:    Tezepelumab-ekko (TEZSPIRE) 210 MG/1. SOAJ, INJECT 1 PEN UNDER THE SKIN EVERY 4 WEEKS, Disp: 1.91 mL, Rfl: 5   tiZANidine (ZANAFLEX) 4 MG tablet, Take 1 tablet (4 mg total) by mouth 3 (three) times daily. (Patient not taking: Reported on 11/09/2022), Disp: 30 tablet, Rfl: 0   Travoprost, BAK Free, (TRAVATAN) 0.004 % SOLN ophthalmic  solution, Place 1 drop into both eyes at bedtime., Disp: , Rfl:   Allergies  Allergen Reactions   Naproxen Hives    "hives, I had to call 911"   Diclofenac Hives   Penicillins Hives and Other (See Comments)    Has patient had a PCN reaction causing immediate rash, facial/tongue/throat swelling, SOB or lightheadedness with hypotension: Yes  Has patient had a PCN reaction causing severe rash involving mucus membranes or skin necrosis: Yes  Has patient had a PCN reaction that required hospitalization No  Has patient had a PCN reaction occurring within the last 10 years: No.  If all of the above answers are "NO", then may proceed with Cephalosporin use.           Objective:  Physical Exam  General: AAO x3, NAD  Dermatological: Nails are hypertrophic, dystrophic with brown discoloration.  Vascular: Dorsalis Pedis artery and Posterior Tibial artery pedal pulses are 2/4 bilateral with immedate capillary fill time. There is no pain with calf compression, swelling, warmth, erythema.   Neruologic: Grossly intact via light touch bilateral.  Sensation intact with Semmes Weinstein monofilament..   Musculoskeletal: Bunion is present.  There is tenderness within the bony eminence.  There is no  crepitation with MPJ range of motion or pain with MPJ range of motion.  No other areas of pinpoint tenderness.  MMT 5/5.     Assessment:   67 year old female with bunion, likely neuropathy, onychodystrophy     Plan:  -Treatment options discussed including all alternatives, risks, and complications -Etiology of symptoms were discussed -X-rays were obtained and reviewed with the patient.  3 views of the feet were obtained and reviewed with the patient.  Moderate bunions are present.  No evidence of acute fracture. Normal Lisfranc joint space. -In regards to the bunion discussed with conservative as well as surgical options.  She is in continue with conservative treatment.  Discussed shoe modifications, offloading, padding. -For neuropathy we discussed gabapentin versus vitamins.  She will start with B complex, alpha lipoic acid. -Urea for the toenails  Return in about 6 months (around 08/07/2023) for diabetic foot exam, neuropathy .  Vivi Barrack DPM

## 2023-02-04 NOTE — Patient Instructions (Addendum)
You can use a B-COMPLEX VITAMIN and ALPHA-LIPOIC ACID. ---  You can use UREA NAIL GEL on the toenail  ----  Diabetes Mellitus and Foot Care Diabetes, also called diabetes mellitus, may cause problems with your feet and legs because of poor blood flow (circulation). Poor circulation may make your skin: Become thinner and drier. Break more easily. Heal more slowly. Peel and crack. You may also have nerve damage (neuropathy). This can cause decreased feeling in your legs and feet. This means that you may not notice minor injuries to your feet that could lead to more serious problems. Finding and treating problems early is the best way to prevent future foot problems. How to care for your feet Foot hygiene  Wash your feet daily with warm water and mild soap. Do not use hot water. Then, pat your feet and the areas between your toes until they are fully dry. Do not soak your feet. This can dry your skin. Trim your toenails straight across. Do not dig under them or around the cuticle. File the edges of your nails with an emery board or nail file. Apply a moisturizing lotion or petroleum jelly to the skin on your feet and to dry, brittle toenails. Use lotion that does not contain alcohol and is unscented. Do not apply lotion between your toes. Shoes and socks Wear clean socks or stockings every day. Make sure they are not too tight. Do not wear knee-high stockings. These may decrease blood flow to your legs. Wear shoes that fit well and have enough cushioning. Always look in your shoes before you put them on to be sure there are no objects inside. To break in new shoes, wear them for just a few hours a day. This prevents injuries on your feet. Wounds, scrapes, corns, and calluses  Check your feet daily for blisters, cuts, bruises, sores, and redness. If you cannot see the bottom of your feet, use a mirror or ask someone for help. Do not cut off corns or calluses or try to remove them with  medicine. If you find a minor scrape, cut, or break in the skin on your feet, keep it and the skin around it clean and dry. You may clean these areas with mild soap and water. Do not clean the area with peroxide, alcohol, or iodine. If you have a wound, scrape, corn, or callus on your foot, look at it several times a day to make sure it is healing and not infected. Check for: Redness, swelling, or pain. Fluid or blood. Warmth. Pus or a bad smell. General tips Do not cross your legs. This may decrease blood flow to your feet. Do not use heating pads or hot water bottles on your feet. They may burn your skin. If you have lost feeling in your feet or legs, you may not know this is happening until it is too late. Protect your feet from hot and cold by wearing shoes, such as at the beach or on hot pavement. Schedule a complete foot exam at least once a year or more often if you have foot problems. Report any cuts, sores, or bruises to your health care provider right away. Where to find more information American Diabetes Association: diabetes.org Association of Diabetes Care & Education Specialists: diabeteseducator.org Contact a health care provider if: You have a condition that increases your risk of infection, and you have any cuts, sores, or bruises on your feet. You have an injury that is not healing. You have redness  on your legs or feet. You feel burning or tingling in your legs or feet. You have pain or cramps in your legs and feet. Your legs or feet are numb. Your feet always feel cold. You have pain around any toenails. Get help right away if: You have a wound, scrape, corn, or callus on your foot and: You have signs of infection. You have a fever. You have a red line going up your leg. This information is not intended to replace advice given to you by your health care provider. Make sure you discuss any questions you have with your health care provider. Document Revised: 12/10/2021  Document Reviewed: 12/10/2021 Elsevier Patient Education  2024 [employer]  ----  Bunion A bunion (hallux valgus) is a bump that forms slowly on the inner side of the big toe joint. It occurs when the big toe turns toward the second toe. Bunions may be small at first, but they often get larger over time. They can make walking painful. What are the causes? This condition may be caused by: Wearing narrow or pointed shoes that force the big toe to press against the other toes. Abnormal foot development that causes the foot to roll inward. Changes in the foot that are caused by certain diseases, such as rheumatoid arthritis or polio. A foot injury. What increases the risk? The following factors may make you more likely to develop this condition: Wearing shoes that squeeze the toes together. Having certain diseases, such as: Rheumatoid arthritis. Polio. Cerebral palsy. Having family members who have bunions. Being born with abnormally shaped feet (a foot deformity), such as flat feet or low arches. Doing activities that put a lot of pressure on the feet, such as ballet dancing. What are the signs or symptoms?  The main symptom of this condition is a bump on your big toe that you can notice. Other symptoms may include: Pain. Redness and inflammation around your big toe. Thick or hardened skin on your big toe or between your toes. Stiffness or loss of motion in your big toe. Trouble with walking. How is this diagnosed? This condition may be diagnosed based on your symptoms, medical history, and activities. You may also have tests and imaging, such as: X-rays. These allow your health care provider to check the position of the bones in your foot and look for damage to your joint. They also help your health care provider determine the severity of your bunion and the best way to treat it. Joint aspiration. In this test, a sample of fluid is removed from the toe joint. This test may be done  if you are in a lot of pain. It helps rule out diseases that cause painful swelling of the joints, such as arthritis or gout. How is this treated? Treatment depends on the severity of your symptoms. The goal of treatment is to relieve symptoms and prevent your bunion from getting worse. Your health care provider may recommend: Wearing shoes that have a wide toe box, or using bunion pads to cushion the affected area. Taping your toes together to keep them in a normal position. Placing a device inside your shoe (orthotic device) to help reduce pressure on your toe joint. Taking medicine to ease pain and inflammation. Putting ice or heat on the affected area. Doing stretching exercises. Surgery, for severe cases. Follow these instructions at home: Managing pain, stiffness, and swelling     If directed, put ice on the painful area. To do this: Put ice in  a plastic bag. Place a towel between your skin and the bag. Leave the ice on for 20 minutes, 2-3 times a day. Remove the ice if your skin turns bright red. This is very important. If you cannot feel pain, heat, or cold, you have a greater risk of damage to the area. If directed, apply heat to the affected area before you exercise. Use the heat source that your health care provider recommends, such as a moist heat pack or a heating pad. Place a towel between your skin and the heat source. Leave the heat on for 20-30 minutes. Remove the heat if your skin turns bright red. This is especially important if you are unable to feel pain, heat, or cold. You have a greater risk of getting burned. General instructions Do exercises as told by your health care provider. Support your toe joint with proper footwear, shoe padding, or taping as told by your health care provider. Take over-the-counter and prescription medicines only as told by your health care provider. Do not use any products that contain nicotine or tobacco, such as cigarettes, e-cigarettes,  and chewing tobacco. If you need help quitting, ask your health care provider. Keep all follow-up visits. This is important. Contact a health care provider if: Your symptoms get worse. Your symptoms do not improve in 2 weeks. Get help right away if: You have severe pain and trouble with walking. Summary A bunion is a bump on the inner side of the big toe joint that forms when the big toe turns toward the second toe. Bunions can make walking painful. Treatment depends on the severity of your symptoms. Support your toe joint with proper footwear, shoe padding, or taping as told by your health care provider. This information is not intended to replace advice given to you by your health care provider. Make sure you discuss any questions you have with your health care provider. Document Revised: 10/12/2019 Document Reviewed: 10/13/2019 Elsevier Patient Education  2024 [employer].

## 2023-02-09 ENCOUNTER — Telehealth: Payer: Self-pay | Admitting: Physical Therapy

## 2023-02-09 ENCOUNTER — Encounter: Payer: BC Managed Care – PPO | Admitting: Physical Therapy

## 2023-02-09 NOTE — Telephone Encounter (Signed)
LVM for pt as she did not show for her PT appt.  No additional appts scheduled, will allow to schedule only 1-2 visits at a time due to 3 no shows if she would like to continue therapy.  Clarita Crane, PT, DPT 02/09/23 11:31 AM

## 2023-02-12 ENCOUNTER — Ambulatory Visit: Payer: BC Managed Care – PPO | Admitting: Surgical

## 2023-02-12 ENCOUNTER — Encounter: Payer: BC Managed Care – PPO | Admitting: Orthopedic Surgery

## 2023-02-12 ENCOUNTER — Encounter: Payer: Self-pay | Admitting: Surgical

## 2023-02-12 DIAGNOSIS — Z96611 Presence of right artificial shoulder joint: Secondary | ICD-10-CM

## 2023-02-12 NOTE — Progress Notes (Signed)
Post-Op Visit Note   Patient: [name]           Date of Birth: 06-Dec-1955           MRN: [id number] Visit Date: 02/12/2023 PCP: Tally Joe, MD   Assessment & Plan:  Chief Complaint:  Chief Complaint  Patient presents with   Right Shoulder - Follow-up   Visit Diagnoses:  1. S/P reverse total shoulder arthroplasty, right     Plan: Patient is a 67 year old female who presents s/p right reverse shoulder arthroplasty with lipoma removal and bicep tenodesis on 11/19/2022.  She is about 12 weeks out.  She is having some good days and bad days.  Still has some pain that wakes her up at night around 2:00 most mornings.  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  She is able to lift her arm overhead and do her hair though her arm does fatigue faster than she would like.  She has missed about 3 sessions of physical therapy and has not been going recently though she does try keep up with a home exercise program.  On exam, patient has 70 degrees X rotation, 95 degrees abduction, 160 degrees forward elevation passively and actively.  She has incisions in the anterior and posterior aspect of the shoulder that are healing well without evidence of infection or dehiscence.  Axillary nerve intact with deltoid firing.  She has a little bit of weakness of subscap on exam.  This does not limit her forward elevation actively with active forward elevation equivalent to passive movement.  2+ radial pulse of the operative extremity.  No Popeye deformity noted.  Plan is restart physical therapy and continue home exercise program.  She is having some continued pain that she is a little bit concerned about so plan to have her return for 48-month final check with Dr. August Saucer but if she is feeling good at that time she may call and cancel.  Follow-Up Instructions: No follow-ups on file.   Orders:  No orders of the defined types were placed in this encounter.  No orders of the defined types  were placed in this encounter.   Imaging: No results found.  PMFS History: Patient Active Problem List   Diagnosis Date Noted   Arthritis of right shoulder region 11/22/2022   Biceps tendonitis on right 11/22/2022   Lipoma of right shoulder 11/22/2022   S/P reverse total shoulder arthroplasty, right 11/19/2022   Poorly controlled persistent asthma 02/27/2022   Pain in right shoulder 02/09/2022   Asthma exacerbation 09/19/2020   Anemia 10/13/2019   Glaucoma 10/13/2019   Hypertensive disorder 10/13/2019   Uterine leiomyoma 10/13/2019   Vitamin D insufficiency 04/26/2019   TIA (transient ischemic attack) 03/30/2019   Hyperlipidemia 03/30/2019   Thyroid nodule 03/30/2019   Stroke-like episode s/p IV tPA 03/28/2019   GERD (gastroesophageal reflux disease) 06/19/2018   Constipation 06/19/2018   Other fatigue 08/02/2017   Shortness of breath on exertion 08/02/2017   Mass of axilla 07/07/2017   Abdominal pain, epigastric    Abnormal CT of the abdomen    Pancreatitis, acute    Acute pancreatitis 08/13/2015   UTI (lower urinary tract infection) 08/13/2015   Severe obesity (BMI >= 40) (HCC) 01/23/2015   Sinusitis, chronic 01/08/2015   Cough variant asthma 01/03/2015   Arthritis of right hip 12/12/2013   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   Benign neoplasm of colon 08/22/2013   Special screening  for malignant neoplasms, colon 08/22/2013   Abdominal pain 08/19/2013   Diabetes mellitus without complication (HCC) 08/19/2013   Essential hypertension, benign 08/19/2013   Ventral hernia 08/19/2013   Past Medical History:  Diagnosis Date   Anemia 2006   required transfusion post TAH/BSO 05/2005   Arthritis    Asthma    Chronic cough    Colon polyps    hyperplastic   Diabetes mellitus without complication (HCC)    Diverticulosis    GERD (gastroesophageal reflux disease)    on prilosec   GLA deficiency (HCC)    Glaucoma of both eyes    Hypertension    Joint  pain    Osteoarthritis    Pancreatitis    Shortness of breath dyspnea    Stroke [facility])    TIA 2022   Vitamin D deficiency     Family History  Problem Relation Age of Onset   Emphysema Mother        smoked   Diabetes Mother    Hypertension Mother    Colon cancer Father        7-s   Hypertension Father    Heart disease Father    High Cholesterol Father    Breast cancer Sister     Past Surgical History:  Procedure Laterality Date   BICEPT TENODESIS Right 11/19/2022   Procedure: BICEPS TENODESIS;  Surgeon: Cammy Copa, MD;  Location: [facility] OR;  Service: Orthopedics;  Laterality: Right;   BREAST EXCISIONAL BIOPSY Right 1990   COLONOSCOPY N/A 08/22/2013   Procedure: COLONOSCOPY;  Surgeon: Hilarie Fredrickson, MD;  Location: [facility] ENDOSCOPY;  Service: Endoscopy;  Laterality: N/A;   ESOPHAGOGASTRODUODENOSCOPY N/A 08/22/2013   Procedure: ESOPHAGOGASTRODUODENOSCOPY (EGD);  Surgeon: Hilarie Fredrickson, MD;  Location: [facility] ENDOSCOPY;  Service: Endoscopy;  Laterality: N/A;   ESOPHAGOGASTRODUODENOSCOPY (EGD) WITH PROPOFOL N/A 08/19/2015   Procedure: ESOPHAGOGASTRODUODENOSCOPY (EGD) WITH PROPOFOL;  Surgeon: Hilarie Fredrickson, MD;  Location: [facility] ENDOSCOPY;  Service: Endoscopy;  Laterality: N/A;   HYSTEROSCOPY WITH D & C  01/2005   for uterine fibroids.    INSERTION OF MESH N/A 08/24/2013   Procedure: INSERTION OF MESH;  Surgeon: Mariella Saa, MD;  Location: [facility] OR;  Service: General;  Laterality: N/A;   LIPOMA EXCISION Left 06/21/2015   Procedure: EXCISION OF LEFT SCALP LIPOMA;  Surgeon: Luretha Murphy, MD;  Location: [facility];  Service: General;  Laterality: Left;   PANNICULECTOMY N/A 08/24/2013   Procedure: PANNICULECTOMY;  Surgeon: Mariella Saa, MD;  Location: [facility] OR;  Service: General;  Laterality: N/A;   REVERSE SHOULDER ARTHROPLASTY Right 11/19/2022   Procedure: REVERSE SHOULDER ARTHROPLASTY, LIPOMA REMOVAL;  Surgeon: Cammy Copa, MD;  Location: [facility] OR;  Service:  Orthopedics;  Laterality: Right;   TOTAL ABDOMINAL HYSTERECTOMY W/ BILATERAL SALPINGOOPHORECTOMY  05/2005   TOTAL HIP ARTHROPLASTY Right 12/12/2013   Procedure: RIGHT TOTAL HIP ARTHROPLASTY ANTERIOR APPROACH;  Surgeon: Kathryne Hitch, MD;  Location: [facility] OR;  Service: Orthopedics;  Laterality: Right;   VENTRAL HERNIA REPAIR N/A 08/24/2013   Procedure: HERNIA REPAIR VENTRAL ADULT;  Surgeon: Mariella Saa, MD;  Location: [facility] OR;  Service: General;  Laterality: N/A;   Social History   Occupational History   Occupation: Child psychotherapist  Tobacco Use   Smoking status: Former    Current packs/day: 0.00    Average packs/day: 0.3 packs/day for 15.0 years (3.8 ttl pk-yrs)    Types: Cigarettes    Start date: 06/23/1963    Quit date:  06/22/1978    Years since quitting: 44.6   Smokeless tobacco: Never  Vaping Use   Vaping status: Never Used  Substance and Sexual Activity   Alcohol use: No    Alcohol/week: 0.0 standard drinks of alcohol   Drug use: No   Sexual activity: Yes

## 2023-02-18 ENCOUNTER — Other Ambulatory Visit: Payer: Self-pay | Admitting: Obstetrics and Gynecology

## 2023-02-18 DIAGNOSIS — Z13820 Encounter for screening for osteoporosis: Secondary | ICD-10-CM

## 2023-03-08 ENCOUNTER — Other Ambulatory Visit: Payer: Self-pay | Admitting: Surgical

## 2023-03-23 ENCOUNTER — Ambulatory Visit: Payer: BC Managed Care – PPO | Admitting: Pulmonary Disease

## 2023-03-24 ENCOUNTER — Ambulatory Visit: Payer: BC Managed Care – PPO | Admitting: Surgical

## 2023-03-24 ENCOUNTER — Encounter: Payer: Self-pay | Admitting: Surgical

## 2023-03-24 DIAGNOSIS — Z8639 Personal history of other endocrine, nutritional and metabolic disease: Secondary | ICD-10-CM

## 2023-03-24 MED ORDER — GABAPENTIN 600 MG PO TABS: 300.0000 mg | ORAL_TABLET | Freq: Every evening | ORAL | 0 refills | Status: AC | PRN

## 2023-03-24 NOTE — Progress Notes (Signed)
Post-Op Visit Note   Patient: [name]           Date of Birth: Jul 04, 1955           MRN: [id number] Visit Date: 03/24/2023 PCP: Tally Joe, MD   Assessment & Plan:  Chief Complaint:  Chief Complaint  Patient presents with   Right Shoulder - Follow-up    Right reverse shoulder arthroplasty 11/19/2022   Visit Diagnoses:  1. History of vitamin D deficiency     Plan: Patient is a 67 year old female who presents s/p right reverse shoulder arthroplasty on 11/19/2022.  She about 4 months postop.  Still has some pain and occasional popping sensation in the shoulder that happens about 1 time per day.  She describes a stabbing pain primarily in the anterior aspect of the shoulder that does radiate into the bicep region.  She has no scapular pain but does have some trapezius pain which is improving.  Occasional neck stiffness, especially in the morning.  Has ongoing numbness and tingling in her right hand that affects her daily and is worse at night.  This will occasionally wake her up and cause her to have to shake her hand out to regain sensation.  This affects her whole hand both on the dorsal and palmar aspect in all 5 fingers.  Denies any fevers or chills or any change in the appearance of the incision.  No recent fall or injury.  On exam, patient has 70 degrees X rotation, 90 degrees abduction, 150 degrees forward elevation passively and actively.  She has moderate tenderness over the acromion and mild tenderness over the [facility] joint of the right shoulder.  No tenderness over the left shoulder region in these areas.  She does have some moderate tenderness over the axial cervical spine as well though she has negative Spurling sign, Lhermitte sign.  Does have some stiffness with looking directly up toward the ceiling.  Intact EPL, FPL, finger abduction, pronation/supination, bicep, tricep, deltoid.  Axillary nerve is intact with deltoid firing and this does reproduce some of her pain.   Positive Tinel sign over the carpal tunnel.  Positive Durkan sign over the carpal tunnel.  No subluxation ulnar nerve.  Negative Tinel sign over the ulnar nerve at the elbow.  Impression is multiple possible etiologies of shoulder pain.  She has some tenderness of her acromion which along with her history of low vitamin D may be acromial stress reaction.  Does not seem debilitating or near stress fracture territory yet.  We will check her vitamin D level yet and supplement this if this is low to see how this affects her shoulder pain.  Do think that she likely has some pain in this arm with especially the numbness and tingling in her hand that is somewhat related to carpal tunnel syndrome and may be related to cervical radiculopathy with the whole hand numbness and tingling in the neck stiffness that she has.  She does not want to work this up any further at this time so we will just try gabapentin to take at night to help with her numbness and tingling in the hands and she will keep her appointment with Dr. August Saucer for recheck on her shoulder in late December.  Follow-Up Instructions: No follow-ups on file.   Orders:  Orders Placed This Encounter  Procedures   Vitamin D (25 hydroxy)   Meds ordered this encounter  Medications   gabapentin (NEURONTIN) 600 MG tablet    Sig: Take  0.5 tablets (300 mg total) by mouth at bedtime as needed.    Dispense:  20 tablet    Refill:  0    Imaging: No results found.  PMFS History: Patient Active Problem List   Diagnosis Date Noted   Arthritis of right shoulder region 11/22/2022   Biceps tendonitis on right 11/22/2022   Lipoma of right shoulder 11/22/2022   S/P reverse total shoulder arthroplasty, right 11/19/2022   Poorly controlled persistent asthma 02/27/2022   Pain in right shoulder 02/09/2022   Asthma exacerbation 09/19/2020   Anemia 10/13/2019   Glaucoma 10/13/2019   Hypertensive disorder 10/13/2019   Uterine leiomyoma 10/13/2019   Vitamin D  insufficiency 04/26/2019   TIA (transient ischemic attack) 03/30/2019   Hyperlipidemia 03/30/2019   Thyroid nodule 03/30/2019   Stroke-like episode s/p IV tPA 03/28/2019   GERD (gastroesophageal reflux disease) 06/19/2018   Constipation 06/19/2018   Other fatigue 08/02/2017   Shortness of breath on exertion 08/02/2017   Mass of axilla 07/07/2017   Abdominal pain, epigastric    Abnormal CT of the abdomen    Pancreatitis, acute    Acute pancreatitis 08/13/2015   Lower urinary tract infectious disease 08/13/2015   Severe obesity (BMI >= 40) (HCC) 01/23/2015   Sinusitis, chronic 01/08/2015   Cough variant asthma 01/03/2015   Arthritis of right hip 12/12/2013   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   Benign neoplasm of colon 08/22/2013   Special screening for malignant neoplasms, colon 08/22/2013   Abdominal pain 08/19/2013   Diabetes mellitus without complication (HCC) 08/19/2013   Essential hypertension, benign 08/19/2013   Ventral hernia 08/19/2013   Past Medical History:  Diagnosis Date   Anemia 2006   required transfusion post TAH/BSO 05/2005   Arthritis    Asthma    Chronic cough    Colon polyps    hyperplastic   Diabetes mellitus without complication (HCC)    Diverticulosis    GERD (gastroesophageal reflux disease)    on prilosec   GLA deficiency (HCC)    Glaucoma of both eyes    Hypertension    Joint pain    Osteoarthritis    Pancreatitis    Shortness of breath dyspnea    Stroke (HCC)    TIA 2022   Vitamin D deficiency     Family History  Problem Relation Age of Onset   Emphysema Mother        smoked   Diabetes Mother    Hypertension Mother    Colon cancer Father        7-s   Hypertension Father    Heart disease Father    High Cholesterol Father    Breast cancer Sister     Past Surgical History:  Procedure Laterality Date   BICEPT TENODESIS Right 11/19/2022   Procedure: BICEPS TENODESIS;  Surgeon: Cammy Copa, MD;  Location: [facility]  OR;  Service: Orthopedics;  Laterality: Right;   BREAST EXCISIONAL BIOPSY Right 1990   COLONOSCOPY N/A 08/22/2013   Procedure: COLONOSCOPY;  Surgeon: Hilarie Fredrickson, MD;  Location: [facility] ENDOSCOPY;  Service: Endoscopy;  Laterality: N/A;   ESOPHAGOGASTRODUODENOSCOPY N/A 08/22/2013   Procedure: ESOPHAGOGASTRODUODENOSCOPY (EGD);  Surgeon: Hilarie Fredrickson, MD;  Location: [facility] ENDOSCOPY;  Service: Endoscopy;  Laterality: N/A;   ESOPHAGOGASTRODUODENOSCOPY (EGD) WITH PROPOFOL N/A 08/19/2015   Procedure: ESOPHAGOGASTRODUODENOSCOPY (EGD) WITH PROPOFOL;  Surgeon: Hilarie Fredrickson, MD;  Location: [facility] ENDOSCOPY;  Service: Endoscopy;  Laterality: N/A;   HYSTEROSCOPY WITH D & C  01/2005  for uterine fibroids.    INSERTION OF MESH N/A 08/24/2013   Procedure: INSERTION OF MESH;  Surgeon: Mariella Saa, MD;  Location: [facility] OR;  Service: General;  Laterality: N/A;   LIPOMA EXCISION Left 06/21/2015   Procedure: EXCISION OF LEFT SCALP LIPOMA;  Surgeon: Luretha Murphy, MD;  Location: [facility];  Service: General;  Laterality: Left;   PANNICULECTOMY N/A 08/24/2013   Procedure: PANNICULECTOMY;  Surgeon: Mariella Saa, MD;  Location: [facility] OR;  Service: General;  Laterality: N/A;   REVERSE SHOULDER ARTHROPLASTY Right 11/19/2022   Procedure: REVERSE SHOULDER ARTHROPLASTY, LIPOMA REMOVAL;  Surgeon: Cammy Copa, MD;  Location: [facility] OR;  Service: Orthopedics;  Laterality: Right;   TOTAL ABDOMINAL HYSTERECTOMY W/ BILATERAL SALPINGOOPHORECTOMY  05/2005   TOTAL HIP ARTHROPLASTY Right 12/12/2013   Procedure: RIGHT TOTAL HIP ARTHROPLASTY ANTERIOR APPROACH;  Surgeon: Kathryne Hitch, MD;  Location: [facility] OR;  Service: Orthopedics;  Laterality: Right;   VENTRAL HERNIA REPAIR N/A 08/24/2013   Procedure: HERNIA REPAIR VENTRAL ADULT;  Surgeon: Mariella Saa, MD;  Location: [facility] OR;  Service: General;  Laterality: N/A;   Social History   Occupational History   Occupation: Child psychotherapist  Tobacco Use    Smoking status: Former    Current packs/day: 0.00    Average packs/day: 0.3 packs/day for 15.0 years (3.8 ttl pk-yrs)    Types: Cigarettes    Start date: 06/23/1963    Quit date: 06/22/1978    Years since quitting: 44.7   Smokeless tobacco: Never  Vaping Use   Vaping status: Never Used  Substance and Sexual Activity   Alcohol use: No    Alcohol/week: 0.0 standard drinks of alcohol   Drug use: No   Sexual activity: Yes

## 2023-03-25 LAB — VITAMIN D 25 HYDROXY (VIT D DEFICIENCY, FRACTURES): Vit D, 25-Hydroxy: 32 ng/mL (ref 30–100)

## 2023-03-26 ENCOUNTER — Other Ambulatory Visit: Payer: Self-pay | Admitting: Surgical

## 2023-03-26 MED ORDER — VITAMIN D (ERGOCALCIFEROL) 1.25 MG (50000 UNIT) PO CAPS: 50000.0000 [IU] | ORAL_CAPSULE | ORAL | 0 refills | Status: DC

## 2023-03-26 NOTE — Progress Notes (Signed)
I prescribed vitamin D supplement.  She will follow-up with Dr. August Saucer in December

## 2023-05-04 ENCOUNTER — Encounter: Payer: Self-pay | Admitting: Pulmonary Disease

## 2023-05-04 ENCOUNTER — Ambulatory Visit (INDEPENDENT_AMBULATORY_CARE_PROVIDER_SITE_OTHER): Payer: BC Managed Care – PPO | Admitting: Pulmonary Disease

## 2023-05-04 ENCOUNTER — Other Ambulatory Visit: Payer: Self-pay

## 2023-05-04 DIAGNOSIS — J45998 Other asthma: Secondary | ICD-10-CM

## 2023-05-04 DIAGNOSIS — J4530 Mild persistent asthma, uncomplicated: Secondary | ICD-10-CM

## 2023-05-04 MED ORDER — TEZSPIRE 210 MG/1.91ML ~~LOC~~ SOAJ
SUBCUTANEOUS | 5 refills | Status: DC
Start: 2023-05-04 — End: 2023-11-19

## 2023-05-04 MED ORDER — EPINEPHRINE 0.3 MG/0.3ML IJ SOAJ: 0.3000 mg | INTRAMUSCULAR | 1 refills | Status: DC | PRN

## 2023-05-04 MED ORDER — ALBUTEROL SULFATE HFA 108 (90 BASE) MCG/ACT IN AERS: 2.0000 | INHALATION_SPRAY | Freq: Four times a day (QID) | RESPIRATORY_TRACT | 6 refills | Status: DC | PRN

## 2023-05-04 MED ORDER — BUDESONIDE-FORMOTEROL FUMARATE 160-4.5 MCG/ACT IN AERO
2.0000 | INHALATION_SPRAY | Freq: Two times a day (BID) | RESPIRATORY_TRACT | 11 refills | Status: AC
Start: 2023-05-04 — End: ?

## 2023-05-04 NOTE — Patient Instructions (Signed)
I am glad you are doing well  I refilled the Tezspire  I refilled the Symbicort, the albuterol, and the EpiPen.  No changes in medication  Return to clinic in 6 months or sooner as needed with Dr. Judeth Horn

## 2023-05-04 NOTE — Progress Notes (Signed)
@Patient  ID: [name], female    DOB: 12-06-1955, 67 y.o.   MRN: [id number]  Chief Complaint  Patient presents with   Follow-up    Doing well.   Refill on Tezspire.    Referring provider: Tally Joe, MD  HPI:   67 y.o. woman who previously followed in pulmonary clinic with Dr. Sherene Sires with asthma here for follow up of DOE felt to be poorly controlled asthma exacerbated by COVID infection.  Orthopedic surgery discharge 11/2022 reviewed.  Multiple outpatient orthopedic surgery note reviewed.  Routine follow-up severe asthma.  1 exacerbation since last visit requiring prednisone.  Continues on Tezspire.  Takes Symbicort, Singulair.  Overall despite exacerbation symptoms seem well-controlled.  HPI at initial visit: Patient has been diagnosed with asthma for years.  Usually relatively well controlled.  Uses Symbicort mid dose.  About once or twice a years with changes states since, spring and fall she has worsening breathing.  This occurred earlier in the month.  All of a sudden she had significant chest tightness, wheeze, shortness of breath.  Associated with hacking cough.  Dry.  Went to urgent care and received a dose of IV steroids.  Her symptoms have improved a good deal but still has lingering cough and mild chest tightness, shortness of breath.  No fever or chills.  No sick contacts.  Denies any viral symptoms such as sore throat, runny nose, fever at time of onset of symptoms.  When asked, she does think maybe she had a little more nasal congestion on the days preceding this acute change.  Chest x-ray 03/04/2020 reviewed and interpreted as clear lungs with mild hyperinflation with increased AP diameter on lateral film.  Prior labs reviewed which are notable for elevated IgE greater than 1200 and eosinophils as high as 800 in the past.  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   Questionaires / Pulmonary Flowsheets:   ACT:  Asthma Control Test ACT Total Score  01/21/2022  9:09 AM 16  09/18/2020  4:09 PM 11    MMRC:     No data to display          Epworth:      No data to display          Tests:   FENO:  No results found for: "NITRICOXIDE"  PFT:     No data to display          WALK:      No data to display          Imaging: Personally reviewed, per EMR and discussion in this note  Lab Results: Personally reviewed, eosinophils elevated up to 800 in the past, IgE greater than 1200 CBC    Component Value Date/Time   WBC 5.0 11/10/2022 0833   RBC 4.40 11/10/2022 0833   HGB 13.0 11/10/2022 0833   HGB 13.4 08/02/2017 1035   HCT 40.3 11/10/2022 0833   HCT 40.7 08/02/2017 1035   PLT 227 11/10/2022 0833   MCV 91.6 11/10/2022 0833   MCV 86 08/02/2017 1035   MCH 29.5 11/10/2022 0833   MCHC 32.3 11/10/2022 0833   RDW 13.8 11/10/2022 0833   RDW 14.9 08/02/2017 1035   LYMPHSABS 0.4 (L) 03/15/2021 1539   LYMPHSABS 2.2 08/02/2017 1035  MONOABS 0.3 03/15/2021 1539   EOSABS 0.0 03/15/2021 1539   EOSABS 0.2 08/02/2017 1035   BASOSABS 0.0 03/15/2021 1539   BASOSABS 0.0 08/02/2017 1035    BMET    Component Value Date/Time   NA 139 11/10/2022 0833   NA 141 08/02/2017 1035   K 3.9 11/10/2022 0833   CL 103 11/10/2022 0833   CO2 29 11/10/2022 0833   GLUCOSE 95 11/10/2022 0833   BUN 13 11/10/2022 0833   BUN 14 08/02/2017 1035   CREATININE 0.99 11/10/2022 0833   CALCIUM 9.7 11/10/2022 0833   GFRNONAA >60 11/10/2022 0833   GFRAA >60 03/28/2019 1326    BNP No results found for: "BNP"  ProBNP No results found for: "PROBNP"  Specialty Problems       Pulmonary Problems   Cough variant asthma    Followed in Pulmonary clinic/  [facility] [place]   - allergy profile 01/03/2015 > 0.8, IgE 1256 cat > dog  Dust/ mold - sinus ct > Pos > see sinusitis - 01/17/2015 p extensive coaching HFA effectiveness =   75% > continue symbicort 80 2bid       Sinusitis, chronic    Sinus CT 01/08/2015  Multifocal paranasal sinus disease, most pronounced in the left ethmoid air cell complex and left sphenoid sinus region. Bilateral ostiomeatal unit obstruction. 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 No air-fluid levels.> referred to ENT 01/17/2015 >>>       Shortness of breath on exertion   Asthma exacerbation   Poorly controlled persistent asthma    Allergies  Allergen Reactions   Naproxen Hives    "hives, I had to call 911"   Diclofenac Hives   Penicillins Hives and Other (See Comments)    Has patient had a PCN reaction causing immediate rash, facial/tongue/throat swelling, SOB or lightheadedness with hypotension: Yes  Has patient had a PCN reaction causing severe rash involving mucus membranes or skin necrosis: Yes  Has patient had a PCN reaction that required hospitalization No  Has patient had a PCN reaction occurring within the last 10 years: No.  If all of the above answers are "NO", then may proceed with Cephalosporin use.    Immunization History  Administered Date(s) Administered   Influenza Split 03/22/2014   Influenza,inj,Quad PF,6+ Mos 04/19/2017, 05/08/2018, 03/18/2019   Influenza,inj,quad, With Preservative 04/23/2019, 03/22/2020   PFIZER(Purple Top)SARS-COV-2 Vaccination 08/19/2019, 09/09/2019   Tdap 05/12/2021    Past Medical History:  Diagnosis Date   Anemia 2006   required transfusion post TAH/BSO 05/2005   Arthritis    Asthma    Chronic cough    Colon polyps    hyperplastic   Diabetes mellitus without complication (HCC)    Diverticulosis    GERD (gastroesophageal reflux disease)    on prilosec   GLA deficiency (HCC)    Glaucoma of both eyes    Hypertension     Joint pain    Osteoarthritis    Pancreatitis    Shortness of breath dyspnea    Stroke (HCC)    TIA 2022   Vitamin D deficiency     Tobacco History: Social History   Tobacco Use  Smoking Status Former   Current packs/day: 0.00   Average packs/day: 0.3 packs/day for 15.0 years (3.8 ttl pk-yrs)   Types: Cigarettes   Start date: 06/23/1963   Quit date: 06/22/1978   Years since quitting: 44.8  Smokeless Tobacco Never   Counseling given: Not Answered   Continue to not  smoke  Outpatient Encounter Medications as of 05/04/2023  Medication Sig   albuterol (PROVENTIL) (2.5 MG/3ML) 0.083% nebulizer solution Take 3 mLs (2.5 mg total) by nebulization every 6 (six) hours as needed for wheezing or shortness of breath.   aspirin EC 81 MG EC tablet Take 1 tablet (81 mg total) by mouth daily.   atorvastatin (LIPITOR) 20 MG tablet Take 1 tablet (20 mg total) by mouth daily at 6 PM.   budesonide-formoterol (SYMBICORT) 160-4.5 MCG/ACT inhaler INHALE 2 PUFFS INTO THE LUNGS IN THE MORNING AND AT BEDTIME (Patient taking differently: Inhale 2 puffs into the lungs 2 (two) times daily as needed (shortness of breath).)   cetirizine (ZYRTEC ALLERGY) 10 MG tablet Take 1 tablet (10 mg total) by mouth daily.   chlorpheniramine (CHLOR-TRIMETON) 4 MG tablet Take 1 tablet (4 mg total) by mouth every 6 (six) hours as needed for allergies.   FARXIGA 10 MG TABS tablet Take 10 mg by mouth daily.   fluticasone (FLONASE) 50 MCG/ACT nasal spray Place 1 spray into both nostrils daily.   gabapentin (NEURONTIN) 600 MG tablet Take 0.5 tablets (300 mg total) by mouth at bedtime as needed.   glucose blood test strip Use as instructed   MELATONIN PO Take 1 tablet by mouth at bedtime as needed (sleep).   metFORMIN (GLUCOPHAGE) 1000 MG tablet Take 1,000 mg by mouth 2 (two) times daily with a meal.   montelukast (SINGULAIR) 10 MG tablet TAKE 1 TABLET(10 MG) BY MOUTH AT BEDTIME   omeprazole (PRILOSEC) 20 MG capsule Take 1 capsule  (20 mg total) by mouth in the morning and at bedtime. (Patient taking differently: Take 20 mg by mouth daily.)   sitaGLIPtin (JANUVIA) 50 MG tablet Take 50 mg by mouth daily.   telmisartan (MICARDIS) 40 MG tablet Take 40 mg by mouth daily.   Travoprost, BAK Free, (TRAVATAN) 0.004 % SOLN ophthalmic solution Place 1 drop into both eyes at bedtime.   Vitamin D, Ergocalciferol, (DRISDOL) 1.25 MG (50000 UNIT) CAPS capsule Take 1 capsule (50,000 Units total) by mouth every 7 (seven) days.   [DISCONTINUED] Tezepelumab-ekko (TEZSPIRE) 210 MG/1. SOAJ INJECT 1 PEN UNDER THE SKIN EVERY 4 WEEKS   Tezepelumab-ekko (TEZSPIRE) 210 MG/1. SOAJ INJECT 1 PEN UNDER THE SKIN EVERY 4 WEEKS   [DISCONTINUED] EPINEPHrine 0.3 mg/0.3 mL IJ SOAJ injection Inject 0.3 mg into the muscle as needed for anaphylaxis. (Patient not taking: Reported on 05/04/2023)   [DISCONTINUED] methocarbamol (ROBAXIN) 500 MG tablet Take 1 tablet (500 mg total) by mouth every 6 (six) hours as needed for muscle spasms. (Patient not taking: Reported on 05/04/2023)   [DISCONTINUED] methylPREDNISolone (MEDROL DOSEPAK) 4 MG TBPK tablet Take as directed with food (Patient not taking: Reported on 05/04/2023)   [DISCONTINUED] oxyCODONE-acetaminophen (PERCOCET) 5-325 MG tablet Take 1 tablet by mouth every 12 (twelve) hours as needed for severe pain. (Patient not taking: Reported on 05/04/2023)   [DISCONTINUED] pioglitazone (ACTOS) 15 MG tablet Take 15 mg by mouth daily. (Patient not taking: Reported on 05/04/2023)   [DISCONTINUED] tiZANidine (ZANAFLEX) 4 MG tablet Take 1 tablet (4 mg total) by mouth 3 (three) times daily. (Patient not taking: Reported on 05/04/2023)   No facility-administered encounter medications on file as of 05/04/2023.     Review of Systems  Review of Systems  n/a Physical Exam  BP 116/76 (BP Location: Left Arm, Patient Position: Sitting, Cuff Size: Large)   Pulse 69   Temp 98.5 F (36.9 C) (Oral)   Ht 5\' 2"  (1.575 m)  Wt 161 lb 6.4 oz (73.2 kg)   SpO2 98%   BMI 29.52 kg/m   Wt Readings from Last 5 Encounters:  05/04/23 161 lb 6.4 oz (73.2 kg)  11/19/22 162 lb (73.5 kg)  11/10/22 162 lb (73.5 kg)  08/26/22 167 lb (75.8 kg)  04/24/22 174 lb 2.6 oz (79 kg)    BMI Readings from Last 5 Encounters:  05/04/23 29.52 kg/m  11/19/22 29.63 kg/m  11/10/22 29.63 kg/m  08/26/22 30.54 kg/m  04/24/22 31.85 kg/m     Physical Exam General: Well-appearing, no acute distress Eyes: EOMI PERRLA Neck: Supple, no JVD Respiratory: Clear to auscultation bilaterally, normal work of breathing Cardiovascular: Regular rate and rhythm, no murmurs   Assessment & Plan:   Cough: Suspect related to poorly controlled asthma symptoms. Has responded to steroids.  Much improved with switch from Xolair to [employer].  Asthma: Elevated Eos and IgE in past. Recent exacerbation 09/19/2020 in setting of COVID 19 infection and now 2 subsequent exacerbations in the interim. On high dose symbicort and singulair.  Eosinophils and IgE both elevated in the past.  More recently, he is on 400 and IgE 900.  Started Xolair in 2022.  Initially cough and dyspnea improving for several months.  However, starting on May 2023 worsening symptoms with 2 exacerbations requiring prednisone interval worsening wheezing cough.  Started Tezspire 05/2022.  Marked improvement in symptoms.  1 exacerbation requiring prednisone in the last 6 months.  Tezspire refilled today.    No follow-ups on file.   Karren Burly, MD 05/04/2023

## 2023-06-11 ENCOUNTER — Encounter: Payer: Self-pay | Admitting: Orthopedic Surgery

## 2023-06-11 ENCOUNTER — Other Ambulatory Visit (INDEPENDENT_AMBULATORY_CARE_PROVIDER_SITE_OTHER): Payer: Self-pay

## 2023-06-11 ENCOUNTER — Ambulatory Visit (INDEPENDENT_AMBULATORY_CARE_PROVIDER_SITE_OTHER): Payer: BC Managed Care – PPO | Admitting: Orthopedic Surgery

## 2023-06-11 DIAGNOSIS — Z96611 Presence of right artificial shoulder joint: Secondary | ICD-10-CM

## 2023-06-11 MED ORDER — TRAMADOL HCL 50 MG PO TABS: 50.0000 mg | ORAL_TABLET | Freq: Two times a day (BID) | ORAL | 0 refills | Status: DC | PRN

## 2023-06-11 NOTE — Progress Notes (Signed)
Office Visit Note   Patient: [name]           Date of Birth: 1956/01/04           MRN: [id number] Visit Date: 06/11/2023 Requested by: Tally Joe, MD [phone] [place],  [place] [address] PCP: Tally Joe, MD  Subjective: Chief Complaint  Patient presents with   Right Shoulder - Follow-up    11/19/22 right RSA, lipoma removal and BT    HPI: [name] is a 67 y.o. female who presents to the office reporting atraumatic onset right shoulder pain.  She is 7 months out from right reverse shoulder replacement.  She states that her arm feels tired and she does not feel like it is functioning right.  She reports decreased range of motion.  Denies any numbness and tingling or radicular symptoms or neck pain.  She states that crossed arm adduction hurts.  Denies any fevers or chills.  Takes occasional Motrin..                ROS: All systems reviewed are negative as they relate to the chief complaint within the history of present illness.  Patient denies fevers or chills.  Assessment & Plan: Visit Diagnoses:  1. S/P reverse total shoulder arthroplasty, right     Plan: Impression is well-functioning right reverse shoulder replacement.  She has essentially 170 of forward flexion external rotation is about 70 degrees with good rotator cuff strength and abduction is around 100.  Radiographs look good.  Unclear etiology of her pain.  Will try tramadol and see if that helps.  If not we could start with CT scan of the shoulder as well as lab work.  Does not really look infected however and she has an extremely good range of motion at this time.  Follow-Up Instructions: No follow-ups on file.   Orders:  Orders Placed This Encounter  Procedures   XR Shoulder Right   Meds ordered this encounter  Medications   traMADol (ULTRAM) 50 MG tablet    Sig: Take 1 tablet (50 mg total) by mouth every 12 (twelve) hours as needed.    Dispense:  30 tablet    Refill:  0       Procedures: No procedures performed   Clinical Data: No additional findings.  Objective: Vital Signs: There were no vitals taken for this visit.  Physical Exam:  Constitutional: Patient appears well-developed HEENT:  Head: Normocephalic Eyes:EOM are normal Neck: Normal range of motion Cardiovascular: Normal rate Pulmonary/chest: Effort normal Neurologic: Patient is alert Skin: Skin is warm Psychiatric: Patient has normal mood and affect  Ortho Exam: Ortho exam demonstrates 5 out of 5 internal and external rotation strength.  Deltoid is functional.  No warmth to the incision.  No swelling.  No discrete tenderness along the clavicle and minimal tenderness along the acromion.  Specialty Comments:  No specialty comments available.  Imaging: No results found.   PMFS History: Patient Active Problem List   Diagnosis Date Noted   Arthritis of right shoulder region 11/22/2022   Biceps tendonitis on right 11/22/2022   Lipoma of right shoulder 11/22/2022   S/P reverse total shoulder arthroplasty, right 11/19/2022   Poorly controlled persistent asthma 02/27/2022   Pain in right shoulder 02/09/2022   Asthma exacerbation 09/19/2020   Anemia 10/13/2019   Glaucoma 10/13/2019   Hypertensive disorder 10/13/2019   Uterine leiomyoma 10/13/2019   Vitamin D insufficiency 04/26/2019   TIA (transient ischemic  attack) 03/30/2019   Hyperlipidemia 03/30/2019   Thyroid nodule 03/30/2019   Stroke-like episode s/p IV tPA 03/28/2019   GERD (gastroesophageal reflux disease) 06/19/2018   Constipation 06/19/2018   Other fatigue 08/02/2017   Shortness of breath on exertion 08/02/2017   Mass of axilla 07/07/2017   Abdominal pain, epigastric    Abnormal CT of the abdomen    Pancreatitis, acute    Acute pancreatitis 08/13/2015   Lower urinary tract infectious disease 08/13/2015   Severe obesity (BMI >= 40) (HCC) 01/23/2015   Sinusitis, chronic 01/08/2015   Cough variant asthma  01/03/2015   Arthritis of right hip 12/12/2013   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   Benign neoplasm of colon 08/22/2013   Special screening for malignant neoplasms, colon 08/22/2013   Abdominal pain 08/19/2013   Diabetes mellitus without complication (HCC) 08/19/2013   Essential hypertension, benign 08/19/2013   Ventral hernia 08/19/2013   Past Medical History:  Diagnosis Date   Anemia 2006   required transfusion post TAH/BSO 05/2005   Arthritis    Asthma    Chronic cough    Colon polyps    hyperplastic   Diabetes mellitus without complication (HCC)    Diverticulosis    GERD (gastroesophageal reflux disease)    on prilosec   GLA deficiency (HCC)    Glaucoma of both eyes    Hypertension    Joint pain    Osteoarthritis    Pancreatitis    Shortness of breath dyspnea    Stroke (HCC)    TIA 2022   Vitamin D deficiency     Family History  Problem Relation Age of Onset   Emphysema Mother        smoked   Diabetes Mother    Hypertension Mother    Colon cancer Father        7-s   Hypertension Father    Heart disease Father    High Cholesterol Father    Breast cancer Sister     Past Surgical History:  Procedure Laterality Date   BICEPT TENODESIS Right 11/19/2022   Procedure: BICEPS TENODESIS;  Surgeon: Cammy Copa, MD;  Location[facility] OR;  Service: Orthopedics;  Laterality: Right;   BREAST EXCISIONAL BIOPSY Right 1990   COLONOSCOPY N/A 08/22/2013   Procedure: COLONOSCOPY;  Surgeon: Hilarie Fredrickson, MD;  Location: [facility] ENDOSCOPY;  Service: Endoscopy;  Laterality: N/A;   ESOPHAGOGASTRODUODENOSCOPY N/A 08/22/2013   Procedure: ESOPHAGOGASTRODUODENOSCOPY (EGD);  Surgeon: Hilarie Fredrickson, MD;  Location: [facility] ENDOSCOPY;  Service: Endoscopy;  Laterality: N/A;   ESOPHAGOGASTRODUODENOSCOPY (EGD) WITH PROPOFOL N/A 08/19/2015   Procedure: ESOPHAGOGASTRODUODENOSCOPY (EGD) WITH PROPOFOL;  Surgeon: Hilarie Fredrickson, MD;  Location: [facility] ENDOSCOPY;  Service: Endoscopy;   Laterality: N/A;   HYSTEROSCOPY WITH D & C  01/2005   for uterine fibroids.    INSERTION OF MESH N/A 08/24/2013   Procedure: INSERTION OF MESH;  Surgeon: Mariella Saa, MD;  Location: [facility] OR;  Service: General;  Laterality: N/A;   LIPOMA EXCISION Left 06/21/2015   Procedure: EXCISION OF LEFT SCALP LIPOMA;  Surgeon: Luretha Murphy, MD;  Location: [facility];  Service: General;  Laterality: Left;   PANNICULECTOMY N/A 08/24/2013   Procedure: PANNICULECTOMY;  Surgeon: Mariella Saa, MD;  Location: [facility] OR;  Service: General;  Laterality: N/A;   REVERSE SHOULDER ARTHROPLASTY Right 11/19/2022   Procedure: REVERSE SHOULDER ARTHROPLASTY, LIPOMA REMOVAL;  Surgeon: Cammy Copa, MD;  Location: [facility] OR;  Service: Orthopedics;  Laterality: Right;  TOTAL ABDOMINAL HYSTERECTOMY W/ BILATERAL SALPINGOOPHORECTOMY  05/2005   TOTAL HIP ARTHROPLASTY Right 12/12/2013   Procedure: RIGHT TOTAL HIP ARTHROPLASTY ANTERIOR APPROACH;  Surgeon: Kathryne Hitch, MD;  Location: [facility] OR;  Service: Orthopedics;  Laterality: Right;   VENTRAL HERNIA REPAIR N/A 08/24/2013   Procedure: HERNIA REPAIR VENTRAL ADULT;  Surgeon: Mariella Saa, MD;  Location: [facility] OR;  Service: General;  Laterality: N/A;   Social History   Occupational History   Occupation: Child psychotherapist  Tobacco Use   Smoking status: Former    Current packs/day: 0.00    Average packs/day: 0.3 packs/day for 15.0 years (3.8 ttl pk-yrs)    Types: Cigarettes    Start date: 06/23/1963    Quit date: 06/22/1978    Years since quitting: 45.0   Smokeless tobacco: Never  Vaping Use   Vaping status: Never Used  Substance and Sexual Activity   Alcohol use: No    Alcohol/week: 0.0 standard drinks of alcohol   Drug use: No   Sexual activity: Yes

## 2023-07-25 ENCOUNTER — Other Ambulatory Visit: Payer: Self-pay | Admitting: Orthopedic Surgery

## 2023-08-09 ENCOUNTER — Ambulatory Visit (INDEPENDENT_AMBULATORY_CARE_PROVIDER_SITE_OTHER): Payer: 59

## 2023-08-09 ENCOUNTER — Ambulatory Visit: Payer: 59 | Admitting: Podiatry

## 2023-08-09 DIAGNOSIS — E119 Type 2 diabetes mellitus without complications: Secondary | ICD-10-CM

## 2023-08-09 DIAGNOSIS — M79674 Pain in right toe(s): Secondary | ICD-10-CM

## 2023-08-09 DIAGNOSIS — M795 Residual foreign body in soft tissue: Secondary | ICD-10-CM | POA: Diagnosis not present

## 2023-08-09 DIAGNOSIS — M79675 Pain in left toe(s): Secondary | ICD-10-CM

## 2023-08-09 DIAGNOSIS — B351 Tinea unguium: Secondary | ICD-10-CM

## 2023-08-09 DIAGNOSIS — M79671 Pain in right foot: Secondary | ICD-10-CM | POA: Diagnosis not present

## 2023-08-09 NOTE — Progress Notes (Unsigned)
Subjective:   Patient ID: [name], female   DOB: 68 y.o.   MRN: [id number]   HPI Chief Complaint  Patient presents with   [facility]    RM#14 diabetic foot exam, neuropathy    68 year old female present the above concerns.  She presents today for diabetic foot exam also for thick, elongated nails that she is not able to trim her self.  No swelling to the toenail sites.  She does not report any open lesions but she has a painful sore on the bottom of her right foot.  She is not sure if she stepped on anything.  This started about 1 week ago.  She has not had any recent treatment.  No drainage or swelling.  There is no reported.  States the neuropathy symptoms of the same.  We discussed various over-the-counter vitamins that she can take which she has not yet started.  112-114 BS A1c 6.4 last time      Objective:  Physical Exam  General: AAO x3, NAD  Dermatological: Nails are hypertrophic, dystrophic with brown discoloration.  Nails are elongated and the cause discomfort 1 through 5 bilaterally.  On the plantar aspect the right foot is annular hyperkeratotic lesion.  Upon debridement identify any foreign object or puncture wound.  There is no drainage or signs of infection.  Vascular: Dorsalis Pedis artery and Posterior Tibial artery pedal pulses are 2/4 bilateral with immedate capillary fill time. There is no pain with calf compression, swelling, warmth, erythema.   Neruologic: Grossly intact via light touch bilateral.  Sensation intact with Semmes Weinstein monofilament..   Musculoskeletal: Bunion is present.  There is tenderness within the bony eminence.  There is no crepitation with MPJ range of motion or pain with MPJ range of motion.  No other areas of pinpoint tenderness.  MMT 5/5.     Assessment:   68 year old female with bunion, likely neuropathy, onychodystrophy     Plan:  -Treatment options discussed including all alternatives, risks, and complications -Etiology of  symptoms were discussed -X-rays were obtained and reviewed with the patient.  3 views of the right foot were obtained.  There is no evidence of acute acute fracture or foreign body noted.  Skin lesion, possible foreign body -Sharply debrided with skin lesion with any complications.  Unable to appreciate from prior puncture wound.  She does get tenderness localized to this area.  Ultrasound ordered to rule out foreign body.  Diabetic foot exam, neuropathy -Discussed daily foot inspection.  Again discussed B complex vitamin and alpha lipoic acid.  Symptomatic onychomycosis -Sharply Debrided nails x 10 to any complications or bleeding. -Urea nail gel  Vivi Barrack DPM

## 2023-08-09 NOTE — Patient Instructions (Addendum)
You can start a B-COMPLEX VITAMIN and ALPHA LIPOIC ACID   You can use UREA NAIL GEL on the toenails

## 2023-08-16 ENCOUNTER — Other Ambulatory Visit: Payer: Self-pay

## 2023-08-17 ENCOUNTER — Inpatient Hospital Stay: Admission: RE | Admit: 2023-08-17 | Payer: Self-pay | Source: Ambulatory Visit

## 2023-08-20 ENCOUNTER — Inpatient Hospital Stay: Admission: RE | Admit: 2023-08-20 | Payer: Self-pay | Source: Ambulatory Visit

## 2023-08-24 ENCOUNTER — Other Ambulatory Visit: Payer: Self-pay | Admitting: Obstetrics and Gynecology

## 2023-08-24 DIAGNOSIS — R2231 Localized swelling, mass and lump, right upper limb: Secondary | ICD-10-CM

## 2023-08-27 ENCOUNTER — Ambulatory Visit
Admission: RE | Admit: 2023-08-27 | Discharge: 2023-08-27 | Disposition: A | Discharge status: Home / Self Care | Payer: Self-pay | Source: Ambulatory Visit | Attending: Podiatry | Admitting: Podiatry

## 2023-08-27 DIAGNOSIS — M795 Residual foreign body in soft tissue: Secondary | ICD-10-CM

## 2023-08-31 ENCOUNTER — Encounter: Payer: Self-pay | Admitting: Podiatry

## 2023-09-01 ENCOUNTER — Encounter: Payer: Self-pay | Admitting: Gastroenterology

## 2023-09-03 ENCOUNTER — Ambulatory Visit
Admission: RE | Admit: 2023-09-03 | Discharge: 2023-09-03 | Disposition: A | Discharge status: Home / Self Care | Payer: Self-pay | Source: Ambulatory Visit | Attending: Obstetrics and Gynecology | Admitting: Obstetrics and Gynecology

## 2023-09-03 ENCOUNTER — Telehealth: Payer: Self-pay

## 2023-09-03 DIAGNOSIS — R2231 Localized swelling, mass and lump, right upper limb: Secondary | ICD-10-CM

## 2023-09-03 NOTE — Telephone Encounter (Signed)
 PA renewal initiated automatically by CoverMyMeds.  Submitted a Prior Authorization request to [facility] for TEZSPIRE via CoverMyMeds. Will update once we receive a response.   Key: [id number]

## 2023-09-03 NOTE — Telephone Encounter (Signed)
 Received notification from [facility] regarding a prior authorization for TEZSPIRE. Authorization has been APPROVED from 09/03/2023 to 09/02/2024. Approval letter sent to scan center.  Patient must continue to fill through [facility]: [phone]  Authorization # [id number]  Chesley Mires, PharmD, MPH, BCPS, CPP Clinical Pharmacist (Rheumatology and Pulmonology)

## 2023-10-13 ENCOUNTER — Encounter: Payer: Self-pay | Admitting: Orthopaedic Surgery

## 2023-10-13 ENCOUNTER — Other Ambulatory Visit (INDEPENDENT_AMBULATORY_CARE_PROVIDER_SITE_OTHER): Payer: Self-pay

## 2023-10-13 ENCOUNTER — Ambulatory Visit (INDEPENDENT_AMBULATORY_CARE_PROVIDER_SITE_OTHER): Admitting: Orthopaedic Surgery

## 2023-10-13 VITALS — Ht 62.0 in | Wt 159.0 lb

## 2023-10-13 DIAGNOSIS — M25551 Pain in right hip: Secondary | ICD-10-CM

## 2023-10-13 DIAGNOSIS — Z96641 Presence of right artificial hip joint: Secondary | ICD-10-CM | POA: Diagnosis not present

## 2023-10-13 MED ORDER — LIDOCAINE HCL 1 % IJ SOLN
3.0000 mL | INTRAMUSCULAR | Status: AC | PRN
Start: 2023-10-13 — End: 2023-10-13
  Administered 2023-10-13: 3 mL

## 2023-10-13 MED ORDER — METHYLPREDNISOLONE ACETATE 40 MG/ML IJ SUSP
40.0000 mg | INTRAMUSCULAR | Status: AC | PRN
Start: 2023-10-13 — End: 2023-10-13
  Administered 2023-10-13: 40 mg via INTRA_ARTICULAR

## 2023-10-13 NOTE — Progress Notes (Signed)
 The patient is well-known to us .  We replaced her hip on the right side 10 years ago.  She has been having some pain for the last 6 months with no known injury.  She reports some groin pain but most of her pain she points to is the trochanteric area of the IT band.  On exam her right hip moves smoothly and fluidly with no pain in the groin at all.  There is a lot of pain to palpation of the trochanteric area and the proximal IT band.  An AP pelvis and of the right hip shows no complicating features of the hip replacement when compared to previous films.  I did recommend at least a steroid injection over the trochanteric area and IT band and she agreed to this and tolerated well on that right side.  Will see her back in 3 months to see how she is doing overall.  Also recommend stretching and Voltaren gel.    Procedure Note  Patient: [name]             Date of Birth: July 29, 1955           MRN: [id number]             Visit Date: 10/13/2023  Procedures: Visit Diagnoses:  1. Pain in right hip   2. History of total right hip arthroplasty     Large Joint Inj: R greater trochanter on 10/13/2023 8:43 AM Indications: pain and diagnostic evaluation Details: 22 G 1.5 in needle, lateral approach  Arthrogram: No  Medications: 3 mL lidocaine  1 %; 40 mg methylPREDNISolone  acetate 40 MG/ML Outcome: tolerated well, no immediate complications Procedure, treatment alternatives, risks and benefits explained, specific risks discussed. Consent was given by the patient. Immediately prior to procedure a time out was called to verify the correct patient, procedure, equipment, support staff and site/side marked as required. Patient was prepped and draped in the usual sterile fashion.

## 2023-10-18 NOTE — Progress Notes (Signed)
 "  Chief Complaint: Hematochezia Primary GI MD: Dr. Abran  HPI: Discussed the use of AI scribe software for clinical note transcription with the patient, who gave verbal consent to proceed.  History of Present Illness [name] is a 68 year old female who presents with rectal bleeding and changes in bowel habits.  She has experienced rectal bleeding approximately three times over the past three months. The blood is bright red and appears both in the stool and on wiping. She is unsure if straining during bowel movements contributed to the bleeding. A similar episode occurred in 2015, leading to a colonoscopy which showed diverticulosis and polyps with a 5 year recall.  She notes a change in bowel habits, with occasional loose stools. She has not had a firm stool recently and is not taking a fiber supplement. No abdominal pain is reported.  She has experienced an unintentional weight loss of about 50 pounds. Her endocrinologist suggested it might be related to her medication. She is currently taking Farxiga  and possibly Januvia.  No family history of colon cancer.  Wt Readings from Last 3 Encounters:  10/19/23 157 lb 8 oz (71.4 kg)  10/13/23 159 lb (72.1 kg)  05/04/23 161 lb 6.4 oz (73.2 kg)      PREVIOUS GI WORKUP   EGD done in February 2017 which was normal.  Colonoscopy was done in March 2015 with finding of moderate diverticulosis, she had one diminutive polyp which was removed and was hyperplastic. She was recommended for 5 year interval follow-up.   Past Medical History:  Diagnosis Date   Anemia 2006   required transfusion post TAH/BSO 05/2005   Arthritis    Asthma    Chronic cough    Colon polyps    hyperplastic   Diabetes mellitus without complication (HCC)    Diverticulosis    GERD (gastroesophageal reflux disease)    on prilosec   GLA deficiency (HCC)    Glaucoma of both eyes    Hypertension    Joint pain    Osteoarthritis    Pancreatitis    Shortness  of breath dyspnea    Stroke [facility])    TIA 2022   Vitamin D  deficiency     Past Surgical History:  Procedure Laterality Date   BICEPT TENODESIS Right 11/19/2022   Procedure: BICEPS TENODESIS;  Surgeon: Addie Cordella Hamilton, MD;  Location: [facility] OR;  Service: Orthopedics;  Laterality: Right;   BREAST EXCISIONAL BIOPSY Right 1990   COLONOSCOPY N/A 08/22/2013   Procedure: COLONOSCOPY;  Surgeon: Norleen LOISE Abran, MD;  Location: [facility] ENDOSCOPY;  Service: Endoscopy;  Laterality: N/A;   ESOPHAGOGASTRODUODENOSCOPY N/A 08/22/2013   Procedure: ESOPHAGOGASTRODUODENOSCOPY (EGD);  Surgeon: Norleen LOISE Abran, MD;  Location: [facility] ENDOSCOPY;  Service: Endoscopy;  Laterality: N/A;   ESOPHAGOGASTRODUODENOSCOPY (EGD) WITH PROPOFOL  N/A 08/19/2015   Procedure: ESOPHAGOGASTRODUODENOSCOPY (EGD) WITH PROPOFOL ;  Surgeon: Norleen LOISE Abran, MD;  Location: [facility] ENDOSCOPY;  Service: Endoscopy;  Laterality: N/A;   HYSTEROSCOPY WITH D & C  01/2005   for uterine fibroids.    INSERTION OF MESH N/A 08/24/2013   Procedure: INSERTION OF MESH;  Surgeon: Morene ONEIDA Olives, MD;  Location: [facility] OR;  Service: General;  Laterality: N/A;   LIPOMA EXCISION Left 06/21/2015   Procedure: EXCISION OF LEFT SCALP LIPOMA;  Surgeon: Donnice Lunger, MD;  Location: [facility];  Service: General;  Laterality: Left;   PANNICULECTOMY N/A 08/24/2013   Procedure: PANNICULECTOMY;  Surgeon: Morene ONEIDA Olives, MD;  Location: [facility] OR;  Service: General;  Laterality: N/A;   REVERSE SHOULDER ARTHROPLASTY Right 11/19/2022   Procedure: REVERSE SHOULDER ARTHROPLASTY, LIPOMA REMOVAL;  Surgeon: Addie Cordella Hamilton, MD;  Location: [facility] OR;  Service: Orthopedics;  Laterality: Right;   TOTAL ABDOMINAL HYSTERECTOMY W/ BILATERAL SALPINGOOPHORECTOMY  05/2005   TOTAL HIP ARTHROPLASTY Right 12/12/2013   Procedure: RIGHT TOTAL HIP ARTHROPLASTY ANTERIOR APPROACH;  Surgeon: Lonni CINDERELLA Poli, MD;  Location: [facility] OR;  Service: Orthopedics;  Laterality: Right;   VENTRAL HERNIA  REPAIR N/A 08/24/2013   Procedure: HERNIA REPAIR VENTRAL ADULT;  Surgeon: Morene ONEIDA Olives, MD;  Location: [facility] OR;  Service: General;  Laterality: N/A;    Current Outpatient Medications  Medication Sig Dispense Refill   albuterol  (PROVENTIL ) (2.5 MG/3ML) 0.083% nebulizer solution Take 3 mLs (2.5 mg total) by nebulization every 6 (six) hours as needed for wheezing or shortness of breath. 75 mL 0   albuterol  (VENTOLIN  HFA) 108 (90 Base) MCG/ACT inhaler Inhale 2 puffs into the lungs every 6 (six) hours as needed. 8 g 6   aspirin  EC 81 MG EC tablet Take 1 tablet (81 mg total) by mouth daily.     atorvastatin  (LIPITOR) 20 MG tablet Take 1 tablet (20 mg total) by mouth daily at 6 PM. 30 tablet 2   budesonide -formoterol  (SYMBICORT ) 160-4.5 MCG/ACT inhaler Inhale 2 puffs into the lungs in the morning and at bedtime. 10.2 g 11   cetirizine  (ZYRTEC  ALLERGY ) 10 MG tablet Take 1 tablet (10 mg total) by mouth daily. 30 tablet 2   chlorpheniramine  (CHLOR-TRIMETON ) 4 MG tablet Take 1 tablet (4 mg total) by mouth every 6 (six) hours as needed for allergies. 30 tablet 0   FARXIGA  10 MG TABS tablet Take 10 mg by mouth daily.     fluticasone  (FLONASE ) 50 MCG/ACT nasal spray Place 1 spray into both nostrils daily. 15 mL 2   gabapentin  (NEURONTIN ) 600 MG tablet Take 0.5 tablets (300 mg total) by mouth at bedtime as needed. 20 tablet 0   glucose blood test strip Use as instructed 100 each 1   MELATONIN PO Take 1 tablet by mouth at bedtime as needed (sleep).     metFORMIN  (GLUCOPHAGE ) 1000 MG tablet Take 1,000 mg by mouth 2 (two) times daily with a meal.     montelukast  (SINGULAIR ) 10 MG tablet TAKE 1 TABLET(10 MG) BY MOUTH AT BEDTIME 30 tablet 11   Na Sulfate-K Sulfate-Mg Sulfate concentrate (SUPREP) 17.5-3.13-1.6 GM/177ML SOLN Take 1 kit (354 mLs total) by mouth once for 1 dose. 354 mL 0   omeprazole  (PRILOSEC) 20 MG capsule Take 1 capsule (20 mg total) by mouth in the morning and at bedtime. (Patient taking  differently: Take 20 mg by mouth daily.) 60 capsule 2   sitaGLIPtin (JANUVIA) 50 MG tablet Take 50 mg by mouth daily.     telmisartan (MICARDIS) 40 MG tablet Take 40 mg by mouth daily.     Tezepelumab -ekko (TEZSPIRE ) 210 MG/1. SOAJ INJECT 1 PEN UNDER THE SKIN EVERY 4 WEEKS 1.91 mL 5   traMADol  (ULTRAM ) 50 MG tablet Take 1 tablet (50 mg total) by mouth every 12 (twelve) hours as needed. 30 tablet 0   Travoprost , BAK Free, (TRAVATAN ) 0.004 % SOLN ophthalmic solution Place 1 drop into both eyes at bedtime.     EPINEPHrine  0.3 mg/0.3 mL IJ SOAJ injection Inject 0.3 mg into the muscle as needed for anaphylaxis. (Patient not taking: Reported on 10/19/2023) 1 each 1   Vitamin D , Ergocalciferol , (DRISDOL ) 1.25 MG (50000 UNIT)  CAPS capsule Take 1 capsule (50,000 Units total) by mouth every 7 (seven) days. (Patient not taking: Reported on 10/19/2023) 4 capsule 0   No current facility-administered medications for this visit.    Allergies as of 10/19/2023 - Review Complete 10/19/2023  Allergen Reaction Noted   Naproxen  Hives 05/03/2020   Diclofenac Hives 05/16/2014   Penicillins Hives and Other (See Comments) 08/19/2013    Family History  Problem Relation Age of Onset   Emphysema Mother        smoked   Diabetes Mother    Hypertension Mother    Colon cancer Father        7-s   Hypertension Father    Heart disease Father    High Cholesterol Father    Breast cancer Sister     Social History   Socioeconomic History   Marital status: Married    Spouse name: Lacresha Fusilier   Number of children: 5   Years of education: Not on file   Highest education level: Not on file  Occupational History   Occupation: Child Psychotherapist  Tobacco Use   Smoking status: Former    Current packs/day: 0.00    Average packs/day: 0.3 packs/day for 15.0 years (3.8 ttl pk-yrs)    Types: Cigarettes    Start date: 06/23/1963    Quit date: 06/22/1978    Years since quitting: 45.3   Smokeless tobacco: Never  Vaping  Use   Vaping status: Never Used  Substance and Sexual Activity   Alcohol use: No    Alcohol/week: 0.0 standard drinks of alcohol   Drug use: No   Sexual activity: Yes  Other Topics Concern   Not on file  Social History Narrative   Not on file   Social Drivers of Health   Financial Resource Strain: Not on file  Food Insecurity: Not on file  Transportation Needs: Not on file  Physical Activity: Not on file  Stress: Not on file  Social Connections: Not on file  Intimate Partner Violence: Not on file    Review of Systems:    Constitutional: No weight loss, fever, chills, weakness or fatigue HEENT: Eyes: No change in vision               Ears, Nose, Throat:  No change in hearing or congestion Skin: No rash or itching Cardiovascular: No chest pain, chest pressure or palpitations   Respiratory: No SOB or cough Gastrointestinal: See HPI and otherwise negative Genitourinary: No dysuria or change in urinary frequency Neurological: No headache, dizziness or syncope Musculoskeletal: No new muscle or joint pain Hematologic: No bleeding or bruising Psychiatric: No history of depression or anxiety    Physical Exam:  Vital signs: BP 126/80   Pulse 73   Ht 5' 2 (1.575 m)   Wt 157 lb 8 oz (71.4 kg)   BMI 28.81 kg/m   Constitutional: NAD, alert and cooperative. Appears younger than stated age. Head:  Normocephalic and atraumatic. Eyes:   PEERL, EOMI. No icterus. Conjunctiva pink. Respiratory: Respirations even and unlabored. Lungs clear to auscultation bilaterally.   No wheezes, crackles, or rhonchi.  Cardiovascular:  Regular rate and rhythm. No peripheral edema, cyanosis or pallor.  Gastrointestinal:  Soft, nondistended, nontender. No rebound or guarding. Normal bowel sounds. No appreciable masses or hepatomegaly. Rectal:  Declines Msk:  Symmetrical without gross deformities. Without edema, no deformity or joint abnormality.  Neurologic:  Alert and  oriented x4;  grossly normal  neurologically.  Skin:   Dry and  intact without significant lesions or rashes. Psychiatric: Oriented to person, place and time. Demonstrates good judgement and reason without abnormal affect or behaviors.  RELEVANT LABS AND IMAGING: CBC    Component Value Date/Time   WBC 5.0 11/10/2022 0833   RBC 4.40 11/10/2022 0833   HGB 13.0 11/10/2022 0833   HGB 13.4 08/02/2017 1035   HCT 40.3 11/10/2022 0833   HCT 40.7 08/02/2017 1035   PLT 227 11/10/2022 0833   MCV 91.6 11/10/2022 0833   MCV 86 08/02/2017 1035   MCH 29.5 11/10/2022 0833   MCHC 32.3 11/10/2022 0833   RDW 13.8 11/10/2022 0833   RDW 14.9 08/02/2017 1035   LYMPHSABS 0.4 (L) 03/15/2021 1539   LYMPHSABS 2.2 08/02/2017 1035   MONOABS 0.3 03/15/2021 1539   EOSABS 0.0 03/15/2021 1539   EOSABS 0.2 08/02/2017 1035   BASOSABS 0.0 03/15/2021 1539   BASOSABS 0.0 08/02/2017 1035    CMP     Component Value Date/Time   NA 139 11/10/2022 0833   NA 141 08/02/2017 1035   K 3.9 11/10/2022 0833   CL 103 11/10/2022 0833   CO2 29 11/10/2022 0833   GLUCOSE 95 11/10/2022 0833   BUN 13 11/10/2022 0833   BUN 14 08/02/2017 1035   CREATININE 0.99 11/10/2022 0833   CALCIUM  9.7 11/10/2022 0833   PROT 7.3 04/24/2022 1111   PROT 6.9 08/02/2017 1035   ALBUMIN  4.2 04/24/2022 1111   ALBUMIN  4.4 08/02/2017 1035   AST 15 04/24/2022 1111   ALT 13 04/24/2022 1111   ALKPHOS 73 04/24/2022 1111   BILITOT 0.8 04/24/2022 1111   BILITOT 0.3 08/02/2017 1035   GFRNONAA >60 11/10/2022 0833   GFRAA >60 03/28/2019 1326     Assessment/Plan:   Assessment & Plan  Hematochezia Colonoscopy March 2015 with moderate diverticulosis, 1 diminutive hyperplastic polyp, recall 5 years.  Normal labs May 2024.  CTAP with contrast November 2023 with findings of diverticulosis, otherwise normal. Intermittent bright red rectal bleeding on 3 different occasions. Suspect hemorrhoids, but will need colonoscopy for further evaluation. - colonoscopy for further  evaluation - increase fiber, increase water , increase exercise - I thoroughly discussed the procedure with the patient (at bedside) to include nature of the procedure, alternatives, benefits, and risks (including but not limited to bleeding, infection, perforation, anesthesia/cardiac pulmonary complications).  Patient verbalized understanding and gave verbal consent to proceed with procedure.   Altered bowel habits Occasional loose stools. Reported lack of fiber in diet. - Initiate fiber supplement such as Benefiber or Citrucel daily to help regulate bowel movements. - further evaluate with colonoscopy  Unintentional weight loss Unintentional weight loss of approximately 50 pounds (unknown time frame) thought to be attributed to diabetic medication.  161lbs 04/2023 and 157lbs today - likely due to diabetic medication changes but will evaluate with colonoscopy   TIA 2022  Adult onset diabetes Hypertension  [name]  Gastroenterology 10/19/2023, 10:34 AM  Cc: Sun, Vyvyan, MD "

## 2023-10-19 ENCOUNTER — Ambulatory Visit: Admitting: Gastroenterology

## 2023-10-19 ENCOUNTER — Encounter: Payer: Self-pay | Admitting: Gastroenterology

## 2023-10-19 VITALS — BP 126/80 | HR 73 | Ht 62.0 in | Wt 157.5 lb

## 2023-10-19 DIAGNOSIS — K921 Melena: Secondary | ICD-10-CM

## 2023-10-19 DIAGNOSIS — K625 Hemorrhage of anus and rectum: Secondary | ICD-10-CM | POA: Diagnosis not present

## 2023-10-19 DIAGNOSIS — Z8719 Personal history of other diseases of the digestive system: Secondary | ICD-10-CM

## 2023-10-19 DIAGNOSIS — E119 Type 2 diabetes mellitus without complications: Secondary | ICD-10-CM

## 2023-10-19 DIAGNOSIS — Z860102 Personal history of hyperplastic colon polyps: Secondary | ICD-10-CM

## 2023-10-19 DIAGNOSIS — R194 Change in bowel habit: Secondary | ICD-10-CM | POA: Diagnosis not present

## 2023-10-19 DIAGNOSIS — R634 Abnormal weight loss: Secondary | ICD-10-CM | POA: Diagnosis not present

## 2023-10-19 MED ORDER — NA SULFATE-K SULFATE-MG SULF 17.5-3.13-1.6 GM/177ML PO SOLN: 1.0000 | Freq: Once | ORAL | 0 refills | Status: AC

## 2023-10-19 NOTE — Patient Instructions (Signed)
 You have been scheduled for a colonoscopy. Please follow written instructions given to you at your visit today.   If you use inhalers (even only as needed), please bring them with you on the day of your procedure.  DO NOT TAKE 7 DAYS PRIOR TO TEST- Trulicity (dulaglutide) Ozempic, Wegovy (semaglutide) Mounjaro (tirzepatide) Bydureon Bcise (exanatide extended release)  DO NOT TAKE 1 DAY PRIOR TO YOUR TEST Rybelsus (semaglutide) Adlyxin (lixisenatide) Victoza (liraglutide) Byetta (exanatide) ___________________________________________________________________________  Please follow up sooner if symptoms increase or worsen   Due to recent changes in healthcare laws, you may see the results of your imaging and laboratory studies on MyChart before your provider has had a chance to review them.  We understand that in some cases there may be results that are confusing or concerning to you. Not all laboratory results come back in the same time frame and the provider may be waiting for multiple results in order to interpret others.  Please give us  48 hours in order for your provider to thoroughly review all the results before contacting the office for clarification of your results.   _______________________________________________________  If your blood pressure at your visit was 140/90 or greater, please contact your primary care physician to follow up on this.  _______________________________________________________  If you are age 36 or older, your body mass index should be between 23-30. Your Body mass index is 28.81 kg/m. If this is out of the aforementioned range listed, please consider follow up with your Primary Care Provider.  If you are age 55 or younger, your body mass index should be between 19-25. Your Body mass index is 28.81 kg/m. If this is out of the aformentioned range listed, please consider follow up with your Primary Care Provider.    ________________________________________________________  The [facility] providers would like to encourage you to use MYCHART to communicate with providers for non-urgent requests or questions.  Due to long hold times on the telephone, sending your provider a message by [facility] may be a faster and more efficient way to get a response.  Please allow 48 business hours for a response.  Please remember that this is for non-urgent requests.  _______________________________________________________ Thank you for trusting me with your gastrointestinal care!   Suzanna Erp, PA

## 2023-10-19 NOTE — Progress Notes (Signed)
 Noted. Colonoscopy to evaluate rectal bleeding, altered bowel habits, and weight loss

## 2023-10-29 ENCOUNTER — Ambulatory Visit
Admission: RE | Admit: 2023-10-29 | Discharge: 2023-10-29 | Disposition: A | Discharge status: Home / Self Care | Payer: BC Managed Care – PPO | Source: Ambulatory Visit | Attending: Obstetrics and Gynecology | Admitting: Obstetrics and Gynecology

## 2023-10-29 DIAGNOSIS — Z13820 Encounter for screening for osteoporosis: Secondary | ICD-10-CM

## 2023-11-18 ENCOUNTER — Telehealth: Payer: Self-pay | Admitting: Pulmonary Disease

## 2023-11-18 DIAGNOSIS — J45998 Other asthma: Secondary | ICD-10-CM

## 2023-11-19 ENCOUNTER — Telehealth: Payer: Self-pay | Admitting: Pulmonary Disease

## 2023-11-19 NOTE — Telephone Encounter (Signed)
 Refill sent for TEZSPIRE  to [facility]: [phone]  Dose: 210mg  subcut every 4 weeks  Last OV: 05/04/2023 Provider: Dr. Marygrace Snellen  Next OV: not scheduled and overdue  Routing to scheduling team for follow-up on appt scheduling  Geraldene Kleine, PharmD, MPH, BCPS Clinical Pharmacist (Rheumatology and Pulmonology)

## 2023-11-19 NOTE — Telephone Encounter (Signed)
 Attempted to call patient. Left voicemail for patient to call back to get scheduled with Dr.Hunsucker.

## 2023-11-22 ENCOUNTER — Encounter: Payer: Self-pay | Admitting: Internal Medicine

## 2023-11-29 ENCOUNTER — Telehealth: Payer: Self-pay | Admitting: Internal Medicine

## 2023-11-29 ENCOUNTER — Telehealth: Payer: Self-pay | Admitting: Gastroenterology

## 2023-11-29 NOTE — Telephone Encounter (Signed)
 Pt is scheduled for colon tomorrow and said she feels sick from not eating.  Asking to speak with nurse

## 2023-11-29 NOTE — Telephone Encounter (Signed)
 RN returned call to patient who stated that as a diabetic, she would be able to better tolerate a morning colonoscopy appointment. Appointment was changed from 11/30/23 at 1:30 to 12/08/23 at 8:00 am. RN updated prep instructions and sent to patient's My Chart per patient request.

## 2023-11-29 NOTE — Telephone Encounter (Signed)
 Inbound call from patient, states she ate half of a salad yesterday night and also drank some coffee this morning with cream and sugar. Patient would like to know if she can continue with procedure for tomorrow at 1:30 PM.

## 2023-11-29 NOTE — Telephone Encounter (Signed)
 RN returned patient's call regarding having consumed some salad Sunday evening and coffee with cream this morning. RN instructed patient to drink plenty of fluids today, referencing the chart in the patient's prep instructions, along with drinking plenty of water  and avoiding any solid food and non-allowed liquids until after her procedure tomorrow. Patient stated understanding.

## 2023-11-30 ENCOUNTER — Encounter: Admitting: Internal Medicine

## 2023-12-06 ENCOUNTER — Ambulatory Visit: Admitting: Orthopedic Surgery

## 2023-12-08 ENCOUNTER — Encounter: Payer: Self-pay | Admitting: Internal Medicine

## 2023-12-08 ENCOUNTER — Ambulatory Visit: Admitting: Internal Medicine

## 2023-12-08 VITALS — BP 130/67 | HR 47 | Temp 98.1°F | Resp 14 | Ht 62.0 in | Wt 157.0 lb

## 2023-12-08 DIAGNOSIS — K5730 Diverticulosis of large intestine without perforation or abscess without bleeding: Secondary | ICD-10-CM

## 2023-12-08 DIAGNOSIS — R634 Abnormal weight loss: Secondary | ICD-10-CM

## 2023-12-08 DIAGNOSIS — R194 Change in bowel habit: Secondary | ICD-10-CM

## 2023-12-08 DIAGNOSIS — K921 Melena: Secondary | ICD-10-CM

## 2023-12-08 DIAGNOSIS — Z1211 Encounter for screening for malignant neoplasm of colon: Secondary | ICD-10-CM

## 2023-12-08 MED ORDER — SODIUM CHLORIDE 0.9 % IV SOLN
500.0000 mL | Freq: Once | INTRAVENOUS | Status: DC
Start: 2023-12-08 — End: 2023-12-08

## 2023-12-08 NOTE — Progress Notes (Signed)
 Pt's states no medical or surgical changes since previsit or office visit.

## 2023-12-08 NOTE — Progress Notes (Signed)
 Called to room to assist during endoscopic procedure.  Patient ID and intended procedure confirmed with present staff. Received instructions for my participation in the procedure from the performing physician.

## 2023-12-08 NOTE — Progress Notes (Signed)
 Expand All Collapse All     Chief Complaint: Hematochezia Primary GI MD: Dr. Elvin Hammer   HPI: Discussed the use of AI scribe software for clinical note transcription with the patient, who gave verbal consent to proceed.   History of Present Illness [name] is a 68 year old female who presents with rectal bleeding and changes in bowel habits.   She has experienced rectal bleeding approximately three times over the past three months. The blood is bright red and appears both in the stool and on wiping. She is unsure if straining during bowel movements contributed to the bleeding. A similar episode occurred in 2015, leading to a colonoscopy which showed diverticulosis and polyps with a 5 year recall.   She notes a change in bowel habits, with occasional loose stools. She has not had a firm stool recently and is not taking a fiber supplement. No abdominal pain is reported.   She has experienced an unintentional weight loss of about 50 pounds. Her endocrinologist suggested it might be related to her medication. She is currently taking Farxiga  and possibly Januvia.   No family history of colon cancer.      Wt Readings from Last 3 Encounters:  10/19/23 157 lb 8 oz (71.4 kg)  10/13/23 159 lb (72.1 kg)  05/04/23 161 lb 6.4 oz (73.2 kg)          PREVIOUS GI WORKUP    EGD done in February 2017 which was normal.   Colonoscopy was done in March 2015 with finding of moderate diverticulosis, she had one diminutive polyp which was removed and was hyperplastic. She was recommended for 5 year interval follow-up.        Past Medical History:  Diagnosis Date   Anemia 2006    required transfusion post TAH/BSO 05/2005   Arthritis     Asthma     Chronic cough     Colon polyps      hyperplastic   Diabetes mellitus without complication (HCC)     Diverticulosis     GERD (gastroesophageal reflux disease)      on prilosec   GLA deficiency (HCC)     Glaucoma of both eyes     Hypertension      Joint pain     Osteoarthritis     Pancreatitis     Shortness of breath dyspnea     Stroke [facility])      TIA 2022   Vitamin D  deficiency                 Past Surgical History:  Procedure Laterality Date   BICEPT TENODESIS Right 11/19/2022    Procedure: BICEPS TENODESIS;  Surgeon: Jasmine Mesi, MD;  Location: [facility] OR;  Service: Orthopedics;  Laterality: Right;   BREAST EXCISIONAL BIOPSY Right 1990   COLONOSCOPY N/A 08/22/2013    Procedure: COLONOSCOPY;  Surgeon: Tobin Forts, MD;  Location: [facility] ENDOSCOPY;  Service: Endoscopy;  Laterality: N/A;   ESOPHAGOGASTRODUODENOSCOPY N/A 08/22/2013    Procedure: ESOPHAGOGASTRODUODENOSCOPY (EGD);  Surgeon: Tobin Forts, MD;  Location: [facility] ENDOSCOPY;  Service: Endoscopy;  Laterality: N/A;   ESOPHAGOGASTRODUODENOSCOPY (EGD) WITH PROPOFOL  N/A 08/19/2015    Procedure: ESOPHAGOGASTRODUODENOSCOPY (EGD) WITH PROPOFOL ;  Surgeon: Tobin Forts, MD;  Location: [facility] ENDOSCOPY;  Service: Endoscopy;  Laterality: N/A;   HYSTEROSCOPY WITH D & C   01/2005    for uterine fibroids.    INSERTION OF MESH N/A 08/24/2013    Procedure: INSERTION OF MESH;  Surgeon:  Quitman Bucy, MD;  Location: [facility] OR;  Service: General;  Laterality: N/A;   LIPOMA EXCISION Left 06/21/2015    Procedure: EXCISION OF LEFT SCALP LIPOMA;  Surgeon: Jacolyn Matar, MD;  Location: [facility];  Service: General;  Laterality: Left;   PANNICULECTOMY N/A 08/24/2013    Procedure: PANNICULECTOMY;  Surgeon: Quitman Bucy, MD;  Location: [facility] OR;  Service: General;  Laterality: N/A;   REVERSE SHOULDER ARTHROPLASTY Right 11/19/2022    Procedure: REVERSE SHOULDER ARTHROPLASTY, LIPOMA REMOVAL;  Surgeon: Jasmine Mesi, MD;  Location: [facility] OR;  Service: Orthopedics;  Laterality: Right;   TOTAL ABDOMINAL HYSTERECTOMY W/ BILATERAL SALPINGOOPHORECTOMY   05/2005   TOTAL HIP ARTHROPLASTY Right 12/12/2013    Procedure: RIGHT TOTAL HIP ARTHROPLASTY ANTERIOR APPROACH;  Surgeon: Arnie Lao, MD;  Location: [facility] OR;  Service: Orthopedics;  Laterality: Right;   VENTRAL HERNIA REPAIR N/A 08/24/2013    Procedure: HERNIA REPAIR VENTRAL ADULT;  Surgeon: Quitman Bucy, MD;  Location: [facility] OR;  Service: General;  Laterality: N/A;                Current Outpatient Medications  Medication Sig Dispense Refill   albuterol  (PROVENTIL ) (2.5 MG/3ML) 0.083% nebulizer solution Take 3 mLs (2.5 mg total) by nebulization every 6 (six) hours as needed for wheezing or shortness of breath. 75 mL 0   albuterol  (VENTOLIN  HFA) 108 (90 Base) MCG/ACT inhaler Inhale 2 puffs into the lungs every 6 (six) hours as needed. 8 g 6   aspirin  EC 81 MG EC tablet Take 1 tablet (81 mg total) by mouth daily.       atorvastatin  (LIPITOR) 20 MG tablet Take 1 tablet (20 mg total) by mouth daily at 6 PM. 30 tablet 2   budesonide -formoterol  (SYMBICORT ) 160-4.5 MCG/ACT inhaler Inhale 2 puffs into the lungs in the morning and at bedtime. 10.2 g 11   cetirizine  (ZYRTEC  ALLERGY ) 10 MG tablet Take 1 tablet (10 mg total) by mouth daily. 30 tablet 2   chlorpheniramine  (CHLOR-TRIMETON ) 4 MG tablet Take 1 tablet (4 mg total) by mouth every 6 (six) hours as needed for allergies. 30 tablet 0   FARXIGA  10 MG TABS tablet Take 10 mg by mouth daily.       fluticasone  (FLONASE ) 50 MCG/ACT nasal spray Place 1 spray into both nostrils daily. 15 mL 2   gabapentin  (NEURONTIN ) 600 MG tablet Take 0.5 tablets (300 mg total) by mouth at bedtime as needed. 20 tablet 0   glucose blood test strip Use as instructed 100 each 1   MELATONIN PO Take 1 tablet by mouth at bedtime as needed (sleep).       metFORMIN  (GLUCOPHAGE ) 1000 MG tablet Take 1,000 mg by mouth 2 (two) times daily with a meal.       montelukast  (SINGULAIR ) 10 MG tablet TAKE 1 TABLET(10 MG) BY MOUTH AT BEDTIME 30 tablet 11   Na Sulfate-K Sulfate-Mg Sulfate concentrate (SUPREP) 17.5-3.13-1.6 GM/177ML SOLN Take 1 kit (354 mLs total) by mouth once for 1 dose. 354 mL 0    omeprazole  (PRILOSEC) 20 MG capsule Take 1 capsule (20 mg total) by mouth in the morning and at bedtime. (Patient taking differently: Take 20 mg by mouth daily.) 60 capsule 2   sitaGLIPtin (JANUVIA) 50 MG tablet Take 50 mg by mouth daily.       telmisartan (MICARDIS) 40 MG tablet Take 40 mg by mouth daily.       Tezepelumab -ekko (TEZSPIRE ) 210 MG/1. SOAJ  INJECT 1 PEN UNDER THE SKIN EVERY 4 WEEKS 1.91 mL 5   traMADol  (ULTRAM ) 50 MG tablet Take 1 tablet (50 mg total) by mouth every 12 (twelve) hours as needed. 30 tablet 0   Travoprost , BAK Free, (TRAVATAN ) 0.004 % SOLN ophthalmic solution Place 1 drop into both eyes at bedtime.       EPINEPHrine  0.3 mg/0.3 mL IJ SOAJ injection Inject 0.3 mg into the muscle as needed for anaphylaxis. (Patient not taking: Reported on 10/19/2023) 1 each 1   Vitamin D , Ergocalciferol , (DRISDOL ) 1.25 MG (50000 UNIT) CAPS capsule Take 1 capsule (50,000 Units total) by mouth every 7 (seven) days. (Patient not taking: Reported on 10/19/2023) 4 capsule 0      No current facility-administered medications for this visit.             Allergies as of 10/19/2023 - Review Complete 10/19/2023  Allergen Reaction Noted   Naproxen  Hives 05/03/2020   Diclofenac Hives 05/16/2014   Penicillins Hives and Other (See Comments) 08/19/2013           Family History  Problem Relation Age of Onset   Emphysema Mother          smoked   Diabetes Mother     Hypertension Mother     Colon cancer Father          7-s   Hypertension Father     Heart disease Father     High Cholesterol Father     Breast cancer Sister            Social History         Socioeconomic History   Marital status: Married      Spouse name: Jossie Smoot   Number of children: 5   Years of education: Not on file   Highest education level: Not on file  Occupational History   Occupation: Child psychotherapist  Tobacco Use   Smoking status: Former      Current packs/day: 0.00      Average packs/day: 0.3  packs/day for 15.0 years (3.8 ttl pk-yrs)      Types: Cigarettes      Start date: 06/23/1963      Quit date: 06/22/1978      Years since quitting: 45.3   Smokeless tobacco: Never  Vaping Use   Vaping status: Never Used  Substance and Sexual Activity   Alcohol use: No      Alcohol/week: 0.0 standard drinks of alcohol   Drug use: No   Sexual activity: Yes  Other Topics Concern   Not on file  Social History Narrative   Not on file    Social Drivers of Health    Financial Resource Strain: Not on file  Food Insecurity: Not on file  Transportation Needs: Not on file  Physical Activity: Not on file  Stress: Not on file  Social Connections: Not on file  Intimate Partner Violence: Not on file      Review of Systems:    Constitutional: No weight loss, fever, chills, weakness or fatigue HEENT: Eyes: No change in vision               Ears, Nose, Throat:  No change in hearing or congestion Skin: No rash or itching Cardiovascular: No chest pain, chest pressure or palpitations   Respiratory: No SOB or cough Gastrointestinal: See HPI and otherwise negative Genitourinary: No dysuria or change in urinary frequency Neurological: No headache, dizziness or syncope Musculoskeletal: No new muscle or  joint pain Hematologic: No bleeding or bruising Psychiatric: No history of depression or anxiety      Physical Exam:  Vital signs: BP 126/80   Pulse 73   Ht 5' 2 (1.575 m)   Wt 157 lb 8 oz (71.4 kg)   BMI 28.81 kg/m    Constitutional: NAD, alert and cooperative. Appears younger than stated age. Head:  Normocephalic and atraumatic. Eyes:   PEERL, EOMI. No icterus. Conjunctiva pink. Respiratory: Respirations even and unlabored. Lungs clear to auscultation bilaterally.   No wheezes, crackles, or rhonchi.  Cardiovascular:  Regular rate and rhythm. No peripheral edema, cyanosis or pallor.  Gastrointestinal:  Soft, nondistended, nontender. No rebound or guarding. Normal bowel sounds. No  appreciable masses or hepatomegaly. Rectal:  Declines Msk:  Symmetrical without gross deformities. Without edema, no deformity or joint abnormality.  Neurologic:  Alert and  oriented x4;  grossly normal neurologically.  Skin:   Dry and intact without significant lesions or rashes. Psychiatric: Oriented to person, place and time. Demonstrates good judgement and reason without abnormal affect or behaviors.   RELEVANT LABS AND IMAGING: CBC Labs (Brief)          Component Value Date/Time    WBC 5.0 11/10/2022 0833    RBC 4.40 11/10/2022 0833    HGB 13.0 11/10/2022 0833    HGB 13.4 08/02/2017 1035    HCT 40.3 11/10/2022 0833    HCT 40.7 08/02/2017 1035    PLT 227 11/10/2022 0833    MCV 91.6 11/10/2022 0833    MCV 86 08/02/2017 1035    MCH 29.5 11/10/2022 0833    MCHC 32.3 11/10/2022 0833    RDW 13.8 11/10/2022 0833    RDW 14.9 08/02/2017 1035    LYMPHSABS 0.4 (L) 03/15/2021 1539    LYMPHSABS 2.2 08/02/2017 1035    MONOABS 0.3 03/15/2021 1539    EOSABS 0.0 03/15/2021 1539    EOSABS 0.2 08/02/2017 1035    BASOSABS 0.0 03/15/2021 1539    BASOSABS 0.0 08/02/2017 1035        CMP     Labs (Brief)          Component Value Date/Time    NA 139 11/10/2022 0833    NA 141 08/02/2017 1035    K 3.9 11/10/2022 0833    CL 103 11/10/2022 0833    CO2 29 11/10/2022 0833    GLUCOSE 95 11/10/2022 0833    BUN 13 11/10/2022 0833    BUN 14 08/02/2017 1035    CREATININE 0.99 11/10/2022 0833    CALCIUM  9.7 11/10/2022 0833    PROT 7.3 04/24/2022 1111    PROT 6.9 08/02/2017 1035    ALBUMIN  4.2 04/24/2022 1111    ALBUMIN  4.4 08/02/2017 1035    AST 15 04/24/2022 1111    ALT 13 04/24/2022 1111    ALKPHOS 73 04/24/2022 1111    BILITOT 0.8 04/24/2022 1111    BILITOT 0.3 08/02/2017 1035    GFRNONAA >60 11/10/2022 0833    GFRAA >60 03/28/2019 1326          Assessment/Plan:    Assessment & Plan   Hematochezia Colonoscopy March 2015 with moderate diverticulosis, 1 diminutive hyperplastic  polyp, recall 5 years.  Normal labs May 2024.  CTAP with contrast November 2023 with findings of diverticulosis, otherwise normal. Intermittent bright red rectal bleeding on 3 different occasions. Suspect hemorrhoids, but will need colonoscopy for further evaluation. - colonoscopy for further evaluation - increase fiber, increase water , increase exercise -  I thoroughly discussed the procedure with the patient (at bedside) to include nature of the procedure, alternatives, benefits, and risks (including but not limited to bleeding, infection, perforation, anesthesia/cardiac pulmonary complications).  Patient verbalized understanding and gave verbal consent to proceed with procedure.    Altered bowel habits Occasional loose stools. Reported lack of fiber in diet. - Initiate fiber supplement such as Benefiber or Citrucel daily to help regulate bowel movements. - further evaluate with colonoscopy   Unintentional weight loss Unintentional weight loss of approximately 50 pounds (unknown time frame) thought to be attributed to diabetic medication.  161lbs 04/2023 and 157lbs today - likely due to diabetic medication changes but will evaluate with colonoscopy     TIA 2022   Adult onset diabetes Hypertension   [facility] 10/19/2023, 10:34 AM   Cc: Sun, Vyvyan, MD

## 2023-12-08 NOTE — Patient Instructions (Signed)
 Handout on diverticulosis given to patient Await pathology results of biopsies taken Repeat colonoscopy in 10 years for surveillance Resume previous diet and continue present medications   YOU HAD AN ENDOSCOPIC PROCEDURE TODAY AT THE [facility]:   Refer to the procedure report that was given to you for any specific questions about what was found during the examination.  If the procedure report does not answer your questions, please call your gastroenterologist to clarify.  If you requested that your care partner not be given the details of your procedure findings, then the procedure report has been included in a sealed envelope for you to review at your convenience later.  YOU SHOULD EXPECT: Some feelings of bloating in the abdomen. Passage of more gas than usual.  Walking can help get rid of the air that was put into your GI tract during the procedure and reduce the bloating. If you had a lower endoscopy (such as a colonoscopy or flexible sigmoidoscopy) you may notice spotting of blood in your stool or on the toilet paper. If you underwent a bowel prep for your procedure, you may not have a normal bowel movement for a few days.  Please Note:  You might notice some irritation and congestion in your nose or some drainage.  This is from the oxygen used during your procedure.  There is no need for concern and it should clear up in a day or so.  SYMPTOMS TO REPORT IMMEDIATELY:  Following lower endoscopy (colonoscopy or flexible sigmoidoscopy):  Excessive amounts of blood in the stool  Significant tenderness or worsening of abdominal pains  Swelling of the abdomen that is new, acute  Fever of 100F or higher  For urgent or emergent issues, a gastroenterologist can be reached at any hour by calling [phone]. Do not use MyChart messaging for urgent concerns.    DIET:  We do recommend a small meal at first, but then you may proceed to your regular diet.  Drink plenty of fluids but  you should avoid alcoholic beverages for 24 hours.  ACTIVITY:  You should plan to take it easy for the rest of today and you should NOT DRIVE or use heavy machinery until tomorrow (because of the sedation medicines used during the test).    FOLLOW UP: Our staff will call the number listed on your records the next business day following your procedure.  We will call around 7:15- 8:00 am to check on you and address any questions or concerns that you may have regarding the information given to you following your procedure. If we do not reach you, we will leave a message.     If any biopsies were taken you will be contacted by phone or by letter within the next 1-3 weeks.  Please call us  at [phone] if you have not heard about the biopsies in 3 weeks.    SIGNATURES/CONFIDENTIALITY: You and/or your care partner have signed paperwork which will be entered into your electronic medical record.  These signatures attest to the fact that that the information above on your After Visit Summary has been reviewed and is understood.  Full responsibility of the confidentiality of this discharge information lies with you and/or your care-partner.

## 2023-12-08 NOTE — Progress Notes (Signed)
 Report to PACU, RN, vss, BBS= Clear.

## 2023-12-08 NOTE — Op Note (Signed)
 [facility] Patient Name: [name] Procedure Date: 12/08/2023 9:10 AM MRN: [id number] Endoscopist: Murel Arlington. Elvin Hammer , MD, [id number] Age: 68 Referring MD:  Date of Birth: 1955-10-18 Gender: Female Account #: [id number] Procedure:                Colonoscopy Indications:              Rectal bleeding, Change in bowel habits (loose                            stools), weight loss. Colon cancer                            screening?"average risk. Last examination in 2015                            was negative for neoplasia Medicines:                Monitored Anesthesia Care Procedure:                Pre-Anesthesia Assessment:                           - Prior to the procedure, a History and Physical                            was performed, and patient medications and                            allergies were reviewed. The patient's tolerance of                            previous anesthesia was also reviewed. The risks                            and benefits of the procedure and the sedation                            options and risks were discussed with the patient.                            All questions were answered, and informed consent                            was obtained. Prior Anticoagulants: The patient has                            taken no anticoagulant or antiplatelet agents. ASA                            Grade Assessment: II - A patient with mild systemic                            disease. After reviewing the risks and benefits,  the patient was deemed in satisfactory condition to                            undergo the procedure.                           After obtaining informed consent, the colonoscope                            was passed under direct vision. Throughout the                            procedure, the patient's blood pressure, pulse, and                            oxygen saturations were monitored continuously.  The                            Olympus CF-HQ190L ([id number]) Colonoscope was                            introduced through the anus and advanced to the the                            cecum, identified by appendiceal orifice and                            ileocecal valve. The ileocecal valve, appendiceal                            orifice, and rectum were photographed. The quality                            of the bowel preparation was excellent. The                            colonoscopy was performed without difficulty. The                            patient tolerated the procedure well. The bowel                            preparation used was SUPREP via split dose                            instruction. Scope In: 9:42:53 AM Scope Out: 9:56:37 AM Scope Withdrawal Time: 0 hours 8 minutes 1 second  Total Procedure Duration: 0 hours 13 minutes 44 seconds  Findings:                 Multiple diverticula were found in the left colon                            and right colon.  The exam was otherwise without abnormality on                            direct and retroflexion views.                           Biopsies for histology were taken with a cold                            forceps from the entire colon for evaluation of                            microscopic colitis. Complications:            No immediate complications. Estimated blood loss:                            None. Estimated Blood Loss:     Estimated blood loss: none. Impression:               - Diverticulosis in the left colon and in the right                            colon.                           - The examination was otherwise normal on direct                            and retroflexion views.                           - Biopsies were taken with a cold forceps from the                            entire colon for evaluation of microscopic colitis. Recommendation:           - Repeat colonoscopy in  10 years for screening                            purposes.                           - Patient has a contact number available for                            emergencies. The signs and symptoms of potential                            delayed complications were discussed with the                            patient. Return to normal activities tomorrow.                            Written discharge instructions were provided to the  patient.                           - Resume previous diet.                           - Continue present medications.                           - Await pathology results. Murel Arlington. Elvin Hammer, MD 12/08/2023 10:04:05 AM This report has been signed electronically.

## 2023-12-09 ENCOUNTER — Telehealth: Payer: Self-pay | Admitting: *Deleted

## 2023-12-09 NOTE — Telephone Encounter (Signed)
  Follow up Call-     12/08/2023    7:58 AM  Call back number  Post procedure Call Back phone  # [phone]  Permission to leave phone message Yes     Patient questions:  Do you have a fever, pain , or abdominal swelling? No. Pain Score  0 *  Have you tolerated food without any problems? Yes.    Have you been able to return to your normal activities? Yes.    Do you have any questions about your discharge instructions: Diet   No. Medications  No. Follow up visit  No.  Do you have questions or concerns about your Care? No.  Actions: * If pain score is 4 or above: No action needed, pain <4.

## 2023-12-13 ENCOUNTER — Ambulatory Visit: Payer: Self-pay | Admitting: Internal Medicine

## 2023-12-13 LAB — SURGICAL PATHOLOGY

## 2023-12-26 ENCOUNTER — Emergency Department (HOSPITAL_COMMUNITY)

## 2023-12-26 ENCOUNTER — Emergency Department (HOSPITAL_COMMUNITY)
Admission: EM | Admit: 2023-12-26 | Discharge: 2023-12-26 | Disposition: A | Discharge status: Home / Self Care | Attending: Emergency Medicine | Admitting: Emergency Medicine

## 2023-12-26 ENCOUNTER — Other Ambulatory Visit: Payer: Self-pay

## 2023-12-26 ENCOUNTER — Encounter (HOSPITAL_COMMUNITY): Payer: Self-pay | Admitting: *Deleted

## 2023-12-26 DIAGNOSIS — Z79899 Other long term (current) drug therapy: Secondary | ICD-10-CM | POA: Diagnosis not present

## 2023-12-26 DIAGNOSIS — Z7982 Long term (current) use of aspirin: Secondary | ICD-10-CM | POA: Insufficient documentation

## 2023-12-26 DIAGNOSIS — R0789 Other chest pain: Secondary | ICD-10-CM | POA: Diagnosis not present

## 2023-12-26 DIAGNOSIS — R079 Chest pain, unspecified: Secondary | ICD-10-CM | POA: Diagnosis present

## 2023-12-26 DIAGNOSIS — R42 Dizziness and giddiness: Secondary | ICD-10-CM | POA: Diagnosis not present

## 2023-12-26 DIAGNOSIS — I10 Essential (primary) hypertension: Secondary | ICD-10-CM | POA: Diagnosis not present

## 2023-12-26 DIAGNOSIS — E119 Type 2 diabetes mellitus without complications: Secondary | ICD-10-CM | POA: Diagnosis not present

## 2023-12-26 DIAGNOSIS — Z7984 Long term (current) use of oral hypoglycemic drugs: Secondary | ICD-10-CM | POA: Insufficient documentation

## 2023-12-26 DIAGNOSIS — Z7951 Long term (current) use of inhaled steroids: Secondary | ICD-10-CM | POA: Diagnosis not present

## 2023-12-26 DIAGNOSIS — J45909 Unspecified asthma, uncomplicated: Secondary | ICD-10-CM | POA: Diagnosis not present

## 2023-12-26 DIAGNOSIS — Z8673 Personal history of transient ischemic attack (TIA), and cerebral infarction without residual deficits: Secondary | ICD-10-CM | POA: Insufficient documentation

## 2023-12-26 LAB — CBC WITH DIFFERENTIAL/PLATELET
Abs Immature Granulocytes: 0.01 K/uL (ref 0.00–0.07)
Basophils Absolute: 0 K/uL (ref 0.0–0.1)
Basophils Relative: 1 %
Eosinophils Absolute: 0.1 K/uL (ref 0.0–0.5)
Eosinophils Relative: 3 %
HCT: 41.4 % (ref 36.0–46.0)
Hemoglobin: 13.4 g/dL (ref 12.0–15.0)
Immature Granulocytes: 0 %
Lymphocytes Relative: 37 %
Lymphs Abs: 1.4 K/uL (ref 0.7–4.0)
MCH: 29.6 pg (ref 26.0–34.0)
MCHC: 32.4 g/dL (ref 30.0–36.0)
MCV: 91.6 fL (ref 80.0–100.0)
Monocytes Absolute: 0.3 K/uL (ref 0.1–1.0)
Monocytes Relative: 8 %
Neutro Abs: 1.9 K/uL (ref 1.7–7.7)
Neutrophils Relative %: 51 %
Platelets: 255 K/uL (ref 150–400)
RBC: 4.52 MIL/uL (ref 3.87–5.11)
RDW: 13.3 % (ref 11.5–15.5)
WBC: 3.8 K/uL — ABNORMAL LOW (ref 4.0–10.5)
nRBC: 0 % (ref 0.0–0.2)

## 2023-12-26 LAB — COMPREHENSIVE METABOLIC PANEL WITH GFR
ALT: 12 U/L (ref 0–44)
AST: 14 U/L — ABNORMAL LOW (ref 15–41)
Albumin: 4.1 g/dL (ref 3.5–5.0)
Alkaline Phosphatase: 67 U/L (ref 38–126)
Anion gap: 10 (ref 5–15)
BUN: 16 mg/dL (ref 8–23)
CO2: 25 mmol/L (ref 22–32)
Calcium: 9.5 mg/dL (ref 8.9–10.3)
Chloride: 105 mmol/L (ref 98–111)
Creatinine, Ser: 0.91 mg/dL (ref 0.44–1.00)
GFR, Estimated: >60 mL/min (ref >60)
Glucose, Bld: 90 mg/dL (ref 70–99)
Potassium: 4 mmol/L (ref 3.5–5.1)
Sodium: 140 mmol/L (ref 135–145)
Total Bilirubin: 0.8 mg/dL (ref 0.0–1.2)
Total Protein: 7.1 g/dL (ref 6.5–8.1)

## 2023-12-26 LAB — TROPONIN I (HIGH SENSITIVITY)
Troponin I (High Sensitivity): 4 ng/L (ref <18)
Troponin I (High Sensitivity): 4 ng/L (ref <18)

## 2023-12-26 LAB — LIPASE, BLOOD: Lipase: 33 U/L (ref 11–51)

## 2023-12-26 MED ORDER — IOHEXOL 350 MG/ML SOLN
75.0000 mL | Freq: Once | INTRAVENOUS | Status: AC | PRN
  Administered 2023-12-26: 75 mL via INTRAVENOUS

## 2023-12-26 MED ORDER — SODIUM CHLORIDE 0.9% FLUSH
3.0000 mL | Freq: Once | INTRAVENOUS | Status: DC
Start: 2023-12-26 — End: 2023-12-27

## 2023-12-26 NOTE — ED Triage Notes (Signed)
 BIB family from [facility] for CP and EKG changes. Describes as pressure. Intermittent over last week. Rates 7/10 at this time. Nausea has resolved. Denies other sx. Alert, NAD, calm, interactive, resps e/u, speaking clearly. See UC paperwork.

## 2023-12-26 NOTE — ED Provider Notes (Signed)
 [place] EMERGENCY DEPARTMENT AT [facility] Provider Note   CSN: [id number] Arrival date & time: 12/26/23  1329     Patient presents with: Chest Pain   [name] is a 68 y.o. female.    Chest Pain Patient chest pain.  Right chest.  Goes to the back.  Has had episodes somewhat over the last week.  Not exertional.  States today more constant.  Had some nausea.  States she felt dizzy.  Goes to the back.  Not short of breath.  No abdominal pain.  Does have history of hypertension states she did not have her medicines this morning.    Past Medical History:  Diagnosis Date   Anemia 2006   required transfusion post TAH/BSO 05/2005   Arthritis    Asthma    Chronic cough    Colon polyps    hyperplastic   Diabetes mellitus without complication (HCC)    Diverticulosis    GERD (gastroesophageal reflux disease)    on prilosec   GLA deficiency (HCC)    Glaucoma of both eyes    Hypertension    Joint pain    Osteoarthritis    Pancreatitis    Shortness of breath dyspnea    Stroke [facility])    TIA 2022   Vitamin D  deficiency     Prior to Admission medications   Medication Sig Start Date End Date Taking? Authorizing Provider  albuterol  (VENTOLIN  HFA) 108 (90 Base) MCG/ACT inhaler Inhale 2 puffs into the lungs every 6 (six) hours as needed. 05/04/23   Hunsucker, Donnice SAUNDERS, MD  aspirin  EC 81 MG EC tablet Take 1 tablet (81 mg total) by mouth daily. 03/31/19   Noemi Reena CROME, NP  atorvastatin  (LIPITOR) 20 MG tablet Take 1 tablet (20 mg total) by mouth daily at 6 PM. 03/30/19   Noemi Reena CROME, NP  budesonide -formoterol  (SYMBICORT ) 160-4.5 MCG/ACT inhaler Inhale 2 puffs into the lungs in the morning and at bedtime. 05/04/23   Hunsucker, Donnice SAUNDERS, MD  cetirizine  (ZYRTEC  ALLERGY ) 10 MG tablet Take 1 tablet (10 mg total) by mouth daily. 08/31/20   Graham, Laura E, PA-C  chlorpheniramine  (CHLOR-TRIMETON ) 4 MG tablet Take 1 tablet (4 mg total) by mouth every 6 (six) hours as needed  for allergies. Patient not taking: Reported on 12/08/2023 09/18/20   Hope Almarie ORN, NP  EPINEPHrine  0.3 mg/0.3 mL IJ SOAJ injection Inject 0.3 mg into the muscle as needed for anaphylaxis. Patient not taking: Reported on 12/08/2023 05/04/23   Hunsucker, Donnice SAUNDERS, MD  FARXIGA  10 MG TABS tablet Take 10 mg by mouth daily. 09/26/20   [provider]  fluticasone  (FLONASE ) 50 MCG/ACT nasal spray Place 1 spray into both nostrils daily. 08/31/20   Graham, Laura E, PA-C  gabapentin  (NEURONTIN ) 600 MG tablet Take 0.5 tablets (300 mg total) by mouth at bedtime as needed. 03/24/23   Magnant, Charles L, PA-C  glucose blood test strip Use as instructed 08/25/17   Verdon Parry D, MD  JANUVIA 100 MG tablet Take 100 mg by mouth daily.    [provider]  metFORMIN  (GLUCOPHAGE ) 1000 MG tablet Take 1,000 mg by mouth 2 (two) times daily with a meal.    [provider]  montelukast  (SINGULAIR ) 10 MG tablet TAKE 1 TABLET(10 MG) BY MOUTH AT BEDTIME 05/19/21   Hunsucker, Donnice SAUNDERS, MD  omeprazole  (PRILOSEC) 20 MG capsule Take 1 capsule (20 mg total) by mouth in the morning and at bedtime. Patient taking differently:  Take 20 mg by mouth daily. 01/02/22   Shelah Lamar RAMAN, MD  telmisartan (MICARDIS) 40 MG tablet Take 40 mg by mouth daily.    [provider]  Tezepelumab -ekko (TEZSPIRE ) 210 MG/1. SOAJ INJECT 1 PEN UNDER THE SKIN EVERY 28 DAYS **APPT NEEDED FOR REFILLS** 11/19/23   Hunsucker, Donnice SAUNDERS, MD  traMADol  (ULTRAM ) 50 MG tablet Take 1 tablet (50 mg total) by mouth every 12 (twelve) hours as needed. Patient not taking: Reported on 12/08/2023 07/26/23   Addie Cordella Hamilton, MD  Travoprost , BAK Free, (TRAVATAN ) 0.004 % SOLN ophthalmic solution Place 1 drop into both eyes at bedtime. 08/11/19   [provider]    Allergies: Naproxen , Penicillins, and Diclofenac    Review of Systems  Cardiovascular:  Positive for chest pain.    Updated Vital Signs BP 120/77   Pulse (!)  53   Temp 97.9 F (36.6 C) (Oral)   Resp 16   Wt 71.2 kg   SpO2 99%   BMI 28.72 kg/m   Physical Exam Vitals reviewed.  Cardiovascular:     Rate and Rhythm: Regular rhythm.  Pulmonary:     Breath sounds: No decreased breath sounds.  Chest:     Chest wall: Tenderness present.     Comments: Mild tenderness right upper chest wall.  No crepitance or deformity. Abdominal:     Tenderness: There is no abdominal tenderness.  Musculoskeletal:     Right lower leg: No edema.     Left lower leg: No edema.  Neurological:     Mental Status: She is alert.     (all labs ordered are listed, but only abnormal results are displayed) Labs Reviewed  CBC WITH DIFFERENTIAL/PLATELET - Abnormal; Notable for the following components:      Result Value   WBC 3.8 (*)    All other components within normal limits  COMPREHENSIVE METABOLIC PANEL WITH GFR - Abnormal; Notable for the following components:   AST 14 (*)    All other components within normal limits  LIPASE, BLOOD  TROPONIN I (HIGH SENSITIVITY)  TROPONIN I (HIGH SENSITIVITY)    EKG: EKG Interpretation Date/Time:  Sunday December 26 2023 14:19:55 EDT Ventricular Rate:  53 PR Interval:  120 QRS Duration:  131 QT Interval:  474 QTC Calculation: 445 R Axis:   60  Text Interpretation: Sinus rhythm Right bundle branch block Confirmed by Patsey Lot [phone]) on 12/26/2023 5:28:20 PM  Radiology: CT Angio Chest Aorta W and/or Wo Contrast Result Date: 12/26/2023 CLINICAL DATA:  Chest pain EXAM: CT ANGIOGRAPHY CHEST WITH CONTRAST TECHNIQUE: Multidetector CT imaging of the chest was performed using the standard protocol during bolus administration of intravenous contrast. Multiplanar CT image reconstructions and MIPs were obtained to evaluate the vascular anatomy. RADIATION DOSE REDUCTION: This exam was performed according to the departmental dose-optimization program which includes automated exposure control, adjustment of the mA and/or kV  according to patient size and/or use of iterative reconstruction technique. CONTRAST:  75mL OMNIPAQUE  IOHEXOL  350 MG/ML SOLN COMPARISON:  Chest x-ray 12/26/2023, CT chest 01/28/2022, FINDINGS: Cardiovascular: Non contrasted images of the chest demonstrate no acute intramural hematoma. Negative for aneurysm or dissection. Common origin of the right brachiocephalic and left common carotid vessels. Normal cardiac size. No pericardial effusion. Though not tailored for assessment of PE, no acute central pulmonary PE is seen Mediastinum/Nodes: Patent trachea. 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 This has been evaluated on previous imaging. (ref: J Am Coll Radiol. 2015 Feb;12(2): 143-50). No suspicious lymph  nodes. Esophagus within normal limits Lungs/Pleura: Lungs are clear. No pleural effusion or pneumothorax. Upper Abdomen: No acute finding Musculoskeletal: No acute osseous abnormality. Mild diffuse heterogeneous sclerosis of the vertebral bodies. Sclerotic degenerative changes at T11-T12. Review of the MIP images confirms the above findings. IMPRESSION: 1. Negative for acute aortic dissection or aneurysm. No CT evidence for acute intrathoracic abnormality. Electronically Signed   By: Luke Bun M.D.   On: 12/26/2023 18:52   DG Chest 2 View Result Date: 12/26/2023 CLINICAL DATA:  CP EXAM: CHEST - 2 VIEW COMPARISON:  March 15, 2021 FINDINGS: No focal airspace consolidation, pleural effusion, or pneumothorax. No cardiomegaly.No acute fracture or destructive lesion. Right shoulder arthroplasty appears anatomically aligned. Multilevel thoracic osteophytosis. IMPRESSION: No acute cardiopulmonary abnormality. Electronically Signed   By: Rogelia Myers M.D.   On: 12/26/2023 14:25     Procedures   Medications Ordered in the ED  sodium chloride  flush (NS) 0.9 % injection 3 mL (3 mLs Intravenous Not Given 12/26/23 1717)  iohexol  (OMNIPAQUE ) 350 MG/ML injection 75 mL (75 mLs Intravenous Contrast Given 12/26/23  1824)                                    Medical Decision Making Amount and/or Complexity of Data Reviewed Labs: ordered. Radiology: ordered.  Risk Prescription drug management.   Patient with chest pain.  Right upper chest wall.  However the pain is go to the back and patient was feeling dizzy.  With pain through the back we will get CT to evaluate.  X-ray reassuring we did show thoracic back issues it also could be because of the pain.  EKG reassuring.  Sent from urgent care with nonspecific T wave changes.  EKG here stable to prior.  Negative first troponin.  CT scan does not show dissection or aneurysm.  Negative troponin x 2.  Potentially reproducible.  Will discharge home.       Final diagnoses:  Nonspecific chest pain    ED Discharge Orders     None          Patsey Lot, MD 12/26/23 2244

## 2024-01-11 ENCOUNTER — Ambulatory Visit: Admitting: Pulmonary Disease

## 2024-01-11 ENCOUNTER — Encounter: Payer: Self-pay | Admitting: Pulmonary Disease

## 2024-01-11 DIAGNOSIS — J45998 Other asthma: Secondary | ICD-10-CM | POA: Diagnosis not present

## 2024-01-11 DIAGNOSIS — Z87891 Personal history of nicotine dependence: Secondary | ICD-10-CM | POA: Diagnosis not present

## 2024-01-11 MED ORDER — TEZSPIRE 210 MG/1.91ML ~~LOC~~ SOAJ: SUBCUTANEOUS | 7 refills | Status: AC

## 2024-01-11 MED ORDER — ALBUTEROL SULFATE HFA 108 (90 BASE) MCG/ACT IN AERS: 2.0000 | INHALATION_SPRAY | Freq: Four times a day (QID) | RESPIRATORY_TRACT | 6 refills | Status: AC | PRN

## 2024-01-11 NOTE — Patient Instructions (Signed)
 No change to the medication  I refilled the albuterol   I sent refills for the test prior  Return to clinic in 6 months or sooner as needed with Dr. Annella

## 2024-01-11 NOTE — Progress Notes (Signed)
 @Patient  ID: [name], female    DOB: [birth year], 68 y.o.   MRN: [id number]  Chief Complaint  Patient presents with   Medical Management of Chronic Issues    Medication f/u    Referring provider: Seabron Lenis, MD  HPI:   68 y.o. woman who previously followed in pulmonary clinic with Dr. Darlean with asthma here for follow up of DOE felt to be poorly controlled asthma exacerbated by COVID infection.  Most recent GI note reviewed.  Multiple telephone encounters from our note, pharmacy reviewed.  Routine follow-up severe asthma.  0 exacerbations since last visit requiring prednisone .  Now over a year since last exacerbation.  Continues on Tezspire .  Takes Symbicort , Singulair .  Good adherence.  Using rescue inhaler 1-2 times a week on average.  Most recent nocturnal use with wheezing at night.  Resolved symptoms.   HPI at initial visit: Patient has been diagnosed with asthma for years.  Usually relatively well controlled.  Uses Symbicort  mid dose.  About once or twice a years with changes states since, spring and fall she has worsening breathing.  This occurred earlier in the month.  All of a sudden she had significant chest tightness, wheeze, shortness of breath.  Associated with hacking cough.  Dry.  Went to urgent care and received a dose of IV steroids.  Her symptoms have improved a good deal but still has lingering cough and mild chest tightness, shortness of breath.  No fever or chills.  No sick contacts.  Denies any viral symptoms such as sore throat, runny nose, fever at time of onset of symptoms.  When asked, she does think maybe she had a little more nasal congestion on the days preceding this acute change.  Chest x-ray 03/04/2020 reviewed and interpreted as clear lungs with mild hyperinflation with increased AP diameter on lateral film.  Prior labs reviewed which are notable for elevated IgE greater than 1200 and eosinophils as high as 800 in the past.  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 all girls   Questionaires / Pulmonary Flowsheets:   ACT:  Asthma Control Test ACT Total Score  01/21/2022  9:09 AM 16  09/18/2020  4:09 PM 11    MMRC:     No data to display          Epworth:      No data to display          Tests:   FENO:  No results found for: NITRICOXIDE  PFT:     No data to display          WALK:      No data to display          Imaging: Personally reviewed, per EMR and discussion in this note  Lab Results: Personally reviewed, eosinophils elevated up to 800 in the past, IgE greater than 1200 CBC    Component Value Date/Time   WBC 3.8 (L) 12/26/2023 1404   RBC 4.52 12/26/2023 1404   HGB 13.4 12/26/2023 1404   HGB 13.4 08/02/2017 1035   HCT 41.4 12/26/2023 1404   HCT 40.7 08/02/2017 1035   PLT 255 12/26/2023 1404   MCV 91.6 12/26/2023 1404   MCV 86 08/02/2017 1035   MCH 29.6 12/26/2023 1404   MCHC  32.4 12/26/2023 1404   RDW 13.3 12/26/2023 1404   RDW 14.9 08/02/2017 1035   LYMPHSABS 1.4 12/26/2023 1404   LYMPHSABS 2.2 08/02/2017 1035   MONOABS 0.3 12/26/2023 1404   EOSABS 0.1 12/26/2023 1404   EOSABS 0.2 08/02/2017 1035   BASOSABS 0.0 12/26/2023 1404   BASOSABS 0.0 08/02/2017 1035    BMET    Component Value Date/Time   NA 140 12/26/2023 1404   NA 141 08/02/2017 1035   K 4.0 12/26/2023 1404   CL 105 12/26/2023 1404   CO2 25 12/26/2023 1404   GLUCOSE 90 12/26/2023 1404   BUN 16 12/26/2023 1404   BUN 14 08/02/2017 1035   CREATININE 0.91 12/26/2023 1404   CALCIUM  9.5 12/26/2023 1404   GFRNONAA >60 12/26/2023 1404   GFRAA >60 03/28/2019 1326    BNP No results found for: BNP  ProBNP No results found for:  PROBNP  Specialty Problems       Pulmonary Problems   Cough variant asthma   Followed in Pulmonary clinic/ [facility]/ [facility]   - allergy  profile 01/03/2015 > 0.8, IgE 1256 cat > dog  Dust/ mold - sinus ct > Pos > see sinusitis - 01/17/2015 p extensive coaching HFA effectiveness =   75% > continue symbicort  80 2bid       Sinusitis, chronic   Sinus CT 01/08/2015  Multifocal paranasal sinus disease, most pronounced in the left ethmoid air cell complex and left sphenoid sinus region. Bilateral ostiomeatal unit obstruction. 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 No air-fluid levels.> referred to ENT 01/17/2015 >>>       Shortness of breath on exertion   Asthma exacerbation   Poorly controlled persistent asthma    Allergies  Allergen Reactions   Naproxen  Hives    hives, I had to call 911   Penicillins Hives and Other (See Comments)    Has patient had a PCN reaction causing immediate rash, facial/tongue/throat swelling, SOB or lightheadedness with hypotension: Yes  Has patient had a PCN reaction causing severe rash involving mucus membranes or skin necrosis: Yes  Has patient had a PCN reaction that required hospitalization No  Has patient had a PCN reaction occurring within the last 10 years: No.  If all of the above answers are NO, then may proceed with Cephalosporin use.   Diclofenac Hives    Immunization History  Administered Date(s) Administered   Influenza Split 03/22/2014   Influenza,inj,Quad PF,6+ Mos 04/19/2017, 05/08/2018, 03/18/2019   Influenza,inj,quad, With Preservative 04/23/2019, 03/22/2020   PFIZER(Purple Top)SARS-COV-2 Vaccination 08/19/2019, 09/09/2019   Tdap 05/12/2021    Past Medical History:  Diagnosis Date   Anemia 2006   required transfusion post TAH/BSO 05/2005   Arthritis    Asthma    Chronic cough    Colon polyps    hyperplastic   Diabetes mellitus without complication (HCC)    Diverticulosis    GERD  (gastroesophageal reflux disease)    on prilosec   GLA deficiency (HCC)    Glaucoma of both eyes    Hypertension    Joint pain    Osteoarthritis    Pancreatitis    Shortness of breath dyspnea    Stroke (HCC)    TIA 2022   Vitamin D  deficiency     Tobacco History: Social History   Tobacco Use  Smoking Status Former   Current packs/day: 0.00   Average packs/day: 0.3 packs/day for 15.0 years (3.8 ttl pk-yrs)   Types: Cigarettes   Start date:  06/23/1963   Quit date: 06/22/1978   Years since quitting: 45.5  Smokeless Tobacco Never   Counseling given: Not Answered   Continue to not smoke  Outpatient Encounter Medications as of 01/11/2024  Medication Sig   aspirin  EC 81 MG EC tablet Take 1 tablet (81 mg total) by mouth daily.   atorvastatin  (LIPITOR) 20 MG tablet Take 1 tablet (20 mg total) by mouth daily at 6 PM.   budesonide -formoterol  (SYMBICORT ) 160-4.5 MCG/ACT inhaler Inhale 2 puffs into the lungs in the morning and at bedtime.   cetirizine  (ZYRTEC  ALLERGY ) 10 MG tablet Take 1 tablet (10 mg total) by mouth daily.   FARXIGA  10 MG TABS tablet Take 10 mg by mouth daily.   fluticasone  (FLONASE ) 50 MCG/ACT nasal spray Place 1 spray into both nostrils daily.   glucose blood test strip Use as instructed   JANUVIA 100 MG tablet Take 100 mg by mouth daily.   metFORMIN  (GLUCOPHAGE ) 1000 MG tablet Take 1,000 mg by mouth 2 (two) times daily with a meal.   montelukast  (SINGULAIR ) 10 MG tablet TAKE 1 TABLET(10 MG) BY MOUTH AT BEDTIME   omeprazole  (PRILOSEC) 20 MG capsule Take 1 capsule (20 mg total) by mouth in the morning and at bedtime. (Patient taking differently: Take 20 mg by mouth daily.)   telmisartan (MICARDIS) 40 MG tablet Take 40 mg by mouth daily.   Travoprost , BAK Free, (TRAVATAN ) 0.004 % SOLN ophthalmic solution Place 1 drop into both eyes at bedtime.   [DISCONTINUED] albuterol  (VENTOLIN  HFA) 108 (90 Base) MCG/ACT inhaler Inhale 2 puffs into the lungs every 6 (six) hours as  needed.   [DISCONTINUED] Tezepelumab -ekko (TEZSPIRE ) 210 MG/1. SOAJ INJECT 1 PEN UNDER THE SKIN EVERY 28 DAYS **APPT NEEDED FOR REFILLS**   albuterol  (VENTOLIN  HFA) 108 (90 Base) MCG/ACT inhaler Inhale 2 puffs into the lungs every 6 (six) hours as needed.   chlorpheniramine  (CHLOR-TRIMETON ) 4 MG tablet Take 1 tablet (4 mg total) by mouth every 6 (six) hours as needed for allergies. (Patient not taking: Reported on 01/11/2024)   gabapentin  (NEURONTIN ) 600 MG tablet Take 0.5 tablets (300 mg total) by mouth at bedtime as needed. (Patient not taking: Reported on 01/11/2024)   Tezepelumab -ekko (TEZSPIRE ) 210 MG/1. SOAJ INJECT 1 PEN UNDER THE SKIN EVERY 28 DAYS **APPT NEEDED FOR REFILLS**   traMADol  (ULTRAM ) 50 MG tablet Take 1 tablet (50 mg total) by mouth every 12 (twelve) hours as needed. (Patient not taking: Reported on 01/11/2024)   [DISCONTINUED] EPINEPHrine  0.3 mg/0.3 mL IJ SOAJ injection Inject 0.3 mg into the muscle as needed for anaphylaxis. (Patient not taking: Reported on 12/08/2023)   No facility-administered encounter medications on file as of 01/11/2024.     Review of Systems  Review of Systems  n/a Physical Exam  BP 120/73   Pulse (!) 59   Temp 97.7 F (36.5 C) (Temporal)   Ht 5' 1 (1.549 m)   Wt 162 lb 12.8 oz (73.8 kg)   SpO2 97% Comment: RA  BMI 30.76 kg/m   Wt Readings from Last 5 Encounters:  01/11/24 162 lb 12.8 oz (73.8 kg)  12/26/23 157 lb (71.2 kg)  12/08/23 157 lb (71.2 kg)  10/19/23 157 lb 8 oz (71.4 kg)  10/13/23 159 lb (72.1 kg)    BMI Readings from Last 5 Encounters:  01/11/24 30.76 kg/m  12/26/23 28.72 kg/m  12/08/23 28.72 kg/m  10/19/23 28.81 kg/m  10/13/23 29.08 kg/m     Physical Exam General: Well-appearing, no acute distress Eyes:  EOMI PERRLA Neck: Supple, no JVD Respiratory: Clear to auscultation bilaterally, normal work of breathing Cardiovascular: Regular rate and rhythm, no murmurs   Assessment & Plan:   Cough: Suspect  related to poorly controlled asthma symptoms. Has responded to steroids.  Much improved with switch from Xolair  to Tezspire .  Asthma: Elevated Eos and IgE in past. Recent exacerbation 09/19/2020 in setting of COVID 19 infection and now 2 subsequent exacerbations in the interim. On high dose symbicort  and singulair .  Eosinophils and IgE both elevated in the past.  More recently, he is on 400 and IgE 900.  Started Xolair  in 2022.  Initially cough and dyspnea improving for several months.  However, starting on May 2023 worsening symptoms with 2 exacerbations requiring prednisone  interval worsening wheezing cough.  Started Tezspire  05/2022.  Marked improvement in symptoms.  0 exacerbation requiring prednisone  in the last year plus.  Albuterol  refilled today.  Tezspire  refilled today.    Return in about 6 months (around 07/13/2024) for f/u Dr. Annella.   Donnice JONELLE Annella, MD 01/11/2024

## 2024-01-12 ENCOUNTER — Encounter: Payer: Self-pay | Admitting: Orthopaedic Surgery

## 2024-01-12 ENCOUNTER — Ambulatory Visit: Admitting: Orthopaedic Surgery

## 2024-01-12 ENCOUNTER — Other Ambulatory Visit: Payer: Self-pay

## 2024-01-12 DIAGNOSIS — M25551 Pain in right hip: Secondary | ICD-10-CM

## 2024-01-12 DIAGNOSIS — Z96641 Presence of right artificial hip joint: Secondary | ICD-10-CM | POA: Diagnosis not present

## 2024-01-12 DIAGNOSIS — M7061 Trochanteric bursitis, right hip: Secondary | ICD-10-CM | POA: Diagnosis not present

## 2024-01-12 NOTE — Progress Notes (Signed)
 The patient comes in today with right hip pain and a limp status post a right total hip arthroplasty done in 2015 through an anterior approach.  She has a diagnosis of trochanteric bursitis and a steroid injection of the trochanteric area back in April of this year helped quite a bit.  She says over the last 2 weeks she has had an increase in lateral hip pain.  She says she has pain when getting up and down.  We have evaluated the right hip replacement earlier this year and the components are intact and there was no complicating features.  She is walking without assistive device.  She is an active 68 year old female.  I can easily put her right hip through internal and external rotation with no blocks to rotation and the pain over the trochanteric area is only mild.  At this point it would be worth sending her to outpatient physical therapy for any modalities that the therapist can find useful that will help decrease her right hip pain as well as strengthen the hip and can hopefully help with her balance and keeping her from limping.  She would like to try some outpatient therapy.  We will hold off on any type of steroid injection today and then see her back in about 6 weeks after course of therapy.

## 2024-01-31 NOTE — Therapy (Signed)
 OUTPATIENT PHYSICAL THERAPY LOWER EXTREMITY EVALUATION   Patient Name: [name] MRN: [id number] DOB:12-06-1955, 68 y.o., female Today's Date: 02/01/2024  END OF SESSION:  PT End of Session - 02/01/24 1311     Visit Number 1    Number of Visits 10    Date for PT Re-Evaluation 03/28/24    Authorization Type [employer] $52    Progress Note Due on Visit 10    PT Start Time 1311    PT Stop Time 1340    PT Time Calculation (min) 29 min    Activity Tolerance Patient tolerated treatment well    Behavior During Therapy [facility] for tasks assessed/performed          Past Medical History:  Diagnosis Date   Anemia 2006   required transfusion post TAH/BSO 05/2005   Arthritis    Asthma    Chronic cough    Colon polyps    hyperplastic   Diabetes mellitus without complication (HCC)    Diverticulosis    GERD (gastroesophageal reflux disease)    on prilosec   GLA deficiency (HCC)    Glaucoma of both eyes    Hypertension    Joint pain    Osteoarthritis    Pancreatitis    Shortness of breath dyspnea    Stroke [facility])    TIA 2022   Vitamin D  deficiency    Past Surgical History:  Procedure Laterality Date   BICEPT TENODESIS Right 11/19/2022   Procedure: BICEPS TENODESIS;  Surgeon: Addie Cordella Hamilton, MD;  Location: [facility] OR;  Service: Orthopedics;  Laterality: Right;   BREAST EXCISIONAL BIOPSY Right 1990   COLONOSCOPY N/A 08/22/2013   Procedure: COLONOSCOPY;  Surgeon: Norleen LOISE Kiang, MD;  Location: [facility] ENDOSCOPY;  Service: Endoscopy;  Laterality: N/A;   ESOPHAGOGASTRODUODENOSCOPY N/A 08/22/2013   Procedure: ESOPHAGOGASTRODUODENOSCOPY (EGD);  Surgeon: Norleen LOISE Kiang, MD;  Location: [facility] ENDOSCOPY;  Service: Endoscopy;  Laterality: N/A;   ESOPHAGOGASTRODUODENOSCOPY (EGD) WITH PROPOFOL  N/A 08/19/2015   Procedure: ESOPHAGOGASTRODUODENOSCOPY (EGD) WITH PROPOFOL ;  Surgeon: Norleen LOISE Kiang, MD;  Location: [facility] ENDOSCOPY;  Service: Endoscopy;  Laterality: N/A;   HYSTEROSCOPY WITH D & C  01/2005    for uterine fibroids.    INSERTION OF MESH N/A 08/24/2013   Procedure: INSERTION OF MESH;  Surgeon: Morene ONEIDA Olives, MD;  Location: [facility] OR;  Service: General;  Laterality: N/A;   LIPOMA EXCISION Left 06/21/2015   Procedure: EXCISION OF LEFT SCALP LIPOMA;  Surgeon: Donnice Lunger, MD;  Location: [facility];  Service: General;  Laterality: Left;   PANNICULECTOMY N/A 08/24/2013   Procedure: PANNICULECTOMY;  Surgeon: Morene ONEIDA Olives, MD;  Location: [facility] OR;  Service: General;  Laterality: N/A;   REVERSE SHOULDER ARTHROPLASTY Right 11/19/2022   Procedure: REVERSE SHOULDER ARTHROPLASTY, LIPOMA REMOVAL;  Surgeon: Addie Cordella Hamilton, MD;  Location: [facility] OR;  Service: Orthopedics;  Laterality: Right;   TOTAL ABDOMINAL HYSTERECTOMY W/ BILATERAL SALPINGOOPHORECTOMY  05/2005   TOTAL HIP ARTHROPLASTY Right 12/12/2013   Procedure: RIGHT TOTAL HIP ARTHROPLASTY ANTERIOR APPROACH;  Surgeon: Lonni CINDERELLA Poli, MD;  Location: [facility] OR;  Service: Orthopedics;  Laterality: Right;   VENTRAL HERNIA REPAIR N/A 08/24/2013   Procedure: HERNIA REPAIR VENTRAL ADULT;  Surgeon: Morene ONEIDA Olives, MD;  Location: [facility] OR;  Service: General;  Laterality: N/A;   Patient Active Problem List   Diagnosis Date Noted   Arthritis of right shoulder region 11/22/2022   Biceps tendonitis on right 11/22/2022   Lipoma of right shoulder 11/22/2022  S/P reverse total shoulder arthroplasty, right 11/19/2022   Poorly controlled persistent asthma 02/27/2022   Pain in right shoulder 02/09/2022   Asthma exacerbation 09/19/2020   Anemia 10/13/2019   Glaucoma 10/13/2019   Hypertensive disorder 10/13/2019   Uterine leiomyoma 10/13/2019   Vitamin D  insufficiency 04/26/2019   TIA (transient ischemic attack) 03/30/2019   Hyperlipidemia 03/30/2019   Thyroid  nodule 03/30/2019   Stroke-like episode s/p IV tPA 03/28/2019   GERD (gastroesophageal reflux disease) 06/19/2018   Constipation 06/19/2018   Other fatigue  08/02/2017   Shortness of breath on exertion 08/02/2017   Mass of axilla 07/07/2017   Abdominal pain, epigastric    Abnormal CT of the abdomen    Pancreatitis, acute    Acute pancreatitis 08/13/2015   Lower urinary tract infectious disease 08/13/2015   Severe obesity (BMI >= 40) (HCC) 01/23/2015   Sinusitis, chronic 01/08/2015   Cough variant asthma 01/03/2015   Arthritis of right hip 12/12/2013   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   Benign neoplasm of colon 08/22/2013   Special screening for malignant neoplasms, colon 08/22/2013   Abdominal pain 08/19/2013   Diabetes mellitus without complication (HCC) 08/19/2013   Essential hypertension, benign 08/19/2013   Ventral hernia 08/19/2013    PCP: Alm Rav, MD  REFERRING PROVIDER: Lonni CINDERELLA Poli, MD   REFERRING DIAG: [id number] (ICD-10-CM) - Pain in right hip  THERAPY DIAG:  Pain in right hip - Plan: PT plan of care cert/re-cert  Difficulty in walking, not elsewhere classified - Plan: PT plan of care cert/re-cert  Stiffness of right hip, not elsewhere classified - Plan: PT plan of care cert/re-cert  Muscle weakness (generalized) - Plan: PT plan of care cert/re-cert  Other abnormalities of gait and mobility - Plan: PT plan of care cert/re-cert  Rationale for Evaluation and Treatment: Rehabilitation  ONSET DATE: 6 months ago ; off and on for about a year   SUBJECTIVE:   SUBJECTIVE STATEMENT: I hate walking with a limp.  PERTINENT HISTORY: Patient has undergone right THA in 2015 and is now experiencing right hip pain to include trochanteric bursitis. She has received a cortisone shot for this problem in the past which helped in the short term. MD sent her to PT to see if PT can resolve/assist pain prior to doing another injection.   PAIN:  Are you having pain? Yes: NPRS scale: 0/10 at rest, 7/10 at worst Pain location: right hip  Pain description: throbbing Aggravating factors: nothing makes it  worse, it is just constant Relieving factors: time, massaging, voltaren (used but doesn't work)  PRECAUTIONS: None  RED FLAGS: None   WEIGHT BEARING RESTRICTIONS: No  FALLS:  Has patient fallen in last 6 months? 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  DIAGNOSTIC FINDINGS: An AP pelvis and lateral of the right hip shows a total hip arthroplasty  with no complicating features.   PATIENT SURVEYS:  PSFS: THE PATIENT SPECIFIC FUNCTIONAL SCALE  Place score of 0-10 (0 = unable  to perform activity and 10 = able to perform activity at the same level as before injury or problem)  Activity Date: 02/01/2024    Get on my knees   6    2. Walk without a limp   4    3.     4.      Total Score 5      Total Score = Sum of activity scores/number of activities  Minimally Detectable Change: 3 points (for single activity); 2 points (for average score)  [facility] (nd). The Patient Specific Functional Scale . Retrieved from [email or web address]   COGNITION: Overall cognitive status: Within functional limits for tasks assessed     SENSATION: Light touch: WFL  EDEMA:  Not tested on eval  MUSCLE LENGTH: Not tested on eval   POSTURE: rounded shoulders, forward head, and increased thoracic kyphosis  PALPATION: TTP at right greater trochanter   LOWER EXTREMITY ROM:  ROM  Right Eval 02/01/2024 Left Eval 02/01/2024  Hip flexion    Hip extension    Hip abduction    Hip adduction    Hip internal rotation    Hip external rotation    Knee flexion    Knee extension    Ankle dorsiflexion    Ankle plantarflexion    Ankle inversion    Ankle eversion     (Blank rows = not tested)  LOWER EXTREMITY MMT:  MMT Right Eval 02/01/2024 Left Eval 02/01/2024  Hip flexion 3+/5 3+/5  Hip extension 3/5 3/5  Hip abduction 3+/5 3+/5  Hip adduction    Hip internal rotation    Hip external rotation    Knee flexion 4+/5 5/5  Knee extension 5/5 5/5  Ankle dorsiflexion    Ankle plantarflexion    Ankle inversion    Ankle eversion     (Blank rows = not tested)  LOWER EXTREMITY SPECIAL TESTS:  Hip special tests: Belvie (FABER) test: negative, Ober's test: negative, and FADIR: negative   FUNCTIONAL TESTS:  Did not assess on eval   GAIT: Distance walked: not formally assessed  Assistive device utilized: None Level of assistance: supervision Comments: antalgic gait pattern, Rt lateral lean with right stance, decreased stance time on Rt LE                                                                                                                                 TREATMENT DATE:  02/01/2024 TherEx:   HEP handout provided with patient performing one set of each exercise for appropriate form. Verbal and tactile cues required.   PATIENT EDUCATION:  Education details: HEP, plan of care, and timeline goals  Person educated: Patient Education method: Explanation, Demonstration, Tactile cues, Verbal cues, and Handouts Education comprehension: verbalized understanding, returned demonstration, verbal cues required, and tactile cues required  HOME EXERCISE PROGRAM: Access Code: [email or web address] URL: [email or web address] Date: 02/01/2024 Prepared by: Susannah Daring  Exercises - Supine Bridge  - 1  x daily - 7 x weekly - 2 sets - 10 reps - 2-3s hold -  Standing Hip Abduction with Counter Support  - 1 x daily - 7 x weekly - 2 sets - 10 reps - Standing Hip Extension with Counter Support  - 1 x daily - 7 x weekly - 2 sets - 10 reps - Mini Squat with Counter Support  - 1 x daily - 7 x weekly - 2 sets - 10 reps - Supine Active Straight Leg Raise  - 1 x daily - 7 x weekly - 2 sets - 10 reps - 2-3s hold  ASSESSMENT:  CLINICAL IMPRESSION: Patient is a 68 y.o. F who was seen today for physical therapy evaluation and treatment for right hip pain secondary to trochanteric bursitis with functional mobility deficits, strength deficits, pain, and gait deficits. Patient notes that her pain is chronic and is motivated to return to her typical activities without a limp and pain. Patient also arrived late to evaluation. Patient will benefit from skilled PT to address above noted impairments.   OBJECTIVE IMPAIRMENTS: Abnormal gait, decreased activity tolerance, decreased balance, decreased mobility, difficulty walking, decreased ROM, decreased strength, improper body mechanics, and postural dysfunction.   ACTIVITY LIMITATIONS: bending, standing, and squatting  PARTICIPATION LIMITATIONS: community activity and occupation  PERSONAL FACTORS: 3+ comorbidities: anemia, DM2, arthritis, asthma, glaucoma, GERD, prior TIA, HTN, kidney disease are also affecting patient's functional outcome.   REHAB POTENTIAL: Good  CLINICAL DECISION MAKING: Stable/uncomplicated  EVALUATION COMPLEXITY: Low   GOALS: Goals reviewed with patient? Yes  SHORT TERM GOALS: Target date: 02/22/2024 Patient will be compliant with initial HEP.  Baseline: Goal status: INITIAL  2.  Patient will report pain level no greater than 4/10 to show improved overall quality of life. Baseline:  Goal status: INITIAL   LONG TERM GOALS: Target date: 03/28/2024  Patient will be independent with final HEP to maintain and progress upon functional gains made within PT.  Baseline:  Goal status:  INITIAL  2.  Patient will increase PSFS to at least 7 to show a significant improvement in subjective disability rating. Baseline:  Goal status: INITIAL  3.  Patient will report pain levels no greater than 2/10 in order to show improved overall quality of life. Baseline:  Goal status: INITIAL  4.  Patient will increase bilat hip abduction MMT to at least 4+/5 in order to show improved biomechanics with functional mobility. Baseline:  Goal status: INITIAL  5.  Patient will increase bilat hip extension MMT to at least 4/5 in order to show improved biomechanics with functional mobility.  Baseline:  Goal status: INITIAL    PLAN:  PT FREQUENCY: 1x/week  PT DURATION: 8 weeks  PLANNED INTERVENTIONS: [id number]- PT Re-evaluation, 97750- Physical Performance Testing, 97110-Therapeutic exercises, 97530- Therapeutic activity, [id number]- Neuromuscular re-education, 97535- Self Care, [id number]- Manual therapy, [phone]- Gait training, [id number]- Electrical stimulation (unattended), [phone]- Electrical stimulation (manual), [id number]- Vasopneumatic device, [id number]- Ultrasound, [id number]- Traction (mechanical), [id number]- Ionotophoresis 4mg /ml Dexamethasone , [id number] (1-2 muscles), [id number] (3+ muscles)- Dry Needling, Patient/Family education, Balance training, Stair training, Taping, Joint mobilization, Joint manipulation, Spinal manipulation, Spinal mobilization, Scar mobilization, Cryotherapy, and Moist heat  PLAN FOR NEXT SESSION: assess HEP, assess floor transfer, more in depth gait assessment, hip musculature strengthening, assess hamstring/hip flexor length    Susannah Daring, PT, DPT 02/01/24 3:58 PM

## 2024-02-01 ENCOUNTER — Ambulatory Visit

## 2024-02-01 DIAGNOSIS — R262 Difficulty in walking, not elsewhere classified: Secondary | ICD-10-CM

## 2024-02-01 DIAGNOSIS — M25651 Stiffness of right hip, not elsewhere classified: Secondary | ICD-10-CM | POA: Diagnosis not present

## 2024-02-01 DIAGNOSIS — M6281 Muscle weakness (generalized): Secondary | ICD-10-CM | POA: Diagnosis not present

## 2024-02-01 DIAGNOSIS — R2689 Other abnormalities of gait and mobility: Secondary | ICD-10-CM

## 2024-02-01 DIAGNOSIS — M25551 Pain in right hip: Secondary | ICD-10-CM | POA: Diagnosis not present

## 2024-02-07 ENCOUNTER — Encounter: Admitting: Rehabilitative and Restorative Service Providers"

## 2024-02-07 NOTE — Therapy (Incomplete)
 OUTPATIENT PHYSICAL THERAPY TREATMENT   Patient Name: [name] MRN: [id number] DOB:04/18/1956, 68 y.o., female Today's Date: 02/07/2024  END OF SESSION:    Past Medical History:  Diagnosis Date   Anemia 2006   required transfusion post TAH/BSO 05/2005   Arthritis    Asthma    Chronic cough    Colon polyps    hyperplastic   Diabetes mellitus without complication (HCC)    Diverticulosis    GERD (gastroesophageal reflux disease)    on prilosec   GLA deficiency (HCC)    Glaucoma of both eyes    Hypertension    Joint pain    Osteoarthritis    Pancreatitis    Shortness of breath dyspnea    Stroke [facility])    TIA 2022   Vitamin D  deficiency    Past Surgical History:  Procedure Laterality Date   BICEPT TENODESIS Right 11/19/2022   Procedure: BICEPS TENODESIS;  Surgeon: Addie Cordella Hamilton, MD;  Location: [facility] OR;  Service: Orthopedics;  Laterality: Right;   BREAST EXCISIONAL BIOPSY Right 1990   COLONOSCOPY N/A 08/22/2013   Procedure: COLONOSCOPY;  Surgeon: Norleen LOISE Kiang, MD;  Location: [facility] ENDOSCOPY;  Service: Endoscopy;  Laterality: N/A;   ESOPHAGOGASTRODUODENOSCOPY N/A 08/22/2013   Procedure: ESOPHAGOGASTRODUODENOSCOPY (EGD);  Surgeon: Norleen LOISE Kiang, MD;  Location: [facility] ENDOSCOPY;  Service: Endoscopy;  Laterality: N/A;   ESOPHAGOGASTRODUODENOSCOPY (EGD) WITH PROPOFOL  N/A 08/19/2015   Procedure: ESOPHAGOGASTRODUODENOSCOPY (EGD) WITH PROPOFOL ;  Surgeon: Norleen LOISE Kiang, MD;  Location: [facility] ENDOSCOPY;  Service: Endoscopy;  Laterality: N/A;   HYSTEROSCOPY WITH D & C  01/2005   for uterine fibroids.    INSERTION OF MESH N/A 08/24/2013   Procedure: INSERTION OF MESH;  Surgeon: Morene ONEIDA Olives, MD;  Location: [facility] OR;  Service: General;  Laterality: N/A;   LIPOMA EXCISION Left 06/21/2015   Procedure: EXCISION OF LEFT SCALP LIPOMA;  Surgeon: Donnice Lunger, MD;  Location: [facility];  Service: General;  Laterality: Left;   PANNICULECTOMY N/A 08/24/2013   Procedure:  PANNICULECTOMY;  Surgeon: Morene ONEIDA Olives, MD;  Location: [facility] OR;  Service: General;  Laterality: N/A;   REVERSE SHOULDER ARTHROPLASTY Right 11/19/2022   Procedure: REVERSE SHOULDER ARTHROPLASTY, LIPOMA REMOVAL;  Surgeon: Addie Cordella Hamilton, MD;  Location: [facility] OR;  Service: Orthopedics;  Laterality: Right;   TOTAL ABDOMINAL HYSTERECTOMY W/ BILATERAL SALPINGOOPHORECTOMY  05/2005   TOTAL HIP ARTHROPLASTY Right 12/12/2013   Procedure: RIGHT TOTAL HIP ARTHROPLASTY ANTERIOR APPROACH;  Surgeon: Lonni CINDERELLA Poli, MD;  Location: [facility] OR;  Service: Orthopedics;  Laterality: Right;   VENTRAL HERNIA REPAIR N/A 08/24/2013   Procedure: HERNIA REPAIR VENTRAL ADULT;  Surgeon: Morene ONEIDA Olives, MD;  Location: [facility] OR;  Service: General;  Laterality: N/A;   Patient Active Problem List   Diagnosis Date Noted   Arthritis of right shoulder region 11/22/2022   Biceps tendonitis on right 11/22/2022   Lipoma of right shoulder 11/22/2022   S/P reverse total shoulder arthroplasty, right 11/19/2022   Poorly controlled persistent asthma 02/27/2022   Pain in right shoulder 02/09/2022   Asthma exacerbation 09/19/2020   Anemia 10/13/2019   Glaucoma 10/13/2019   Hypertensive disorder 10/13/2019   Uterine leiomyoma 10/13/2019   Vitamin D  insufficiency 04/26/2019   TIA (transient ischemic attack) 03/30/2019   Hyperlipidemia 03/30/2019   Thyroid  nodule 03/30/2019   Stroke-like episode s/p IV tPA 03/28/2019   GERD (gastroesophageal reflux disease) 06/19/2018   Constipation 06/19/2018   Other fatigue 08/02/2017   Shortness of breath on  exertion 08/02/2017   Mass of axilla 07/07/2017   Abdominal pain, epigastric    Abnormal CT of the abdomen    Pancreatitis, acute    Acute pancreatitis 08/13/2015   Lower urinary tract infectious disease 08/13/2015   Severe obesity (BMI >= 40) (HCC) 01/23/2015   Sinusitis, chronic 01/08/2015   Cough variant asthma 01/03/2015   Arthritis of right hip 12/12/2013   Status  post THR (total hip replacement) 12/12/2013   Benign neoplasm of colon 08/22/2013   Special screening for malignant neoplasms, colon 08/22/2013   Abdominal pain 08/19/2013   Diabetes mellitus without complication (HCC) 08/19/2013   Essential hypertension, benign 08/19/2013   Ventral hernia 08/19/2013    PCP: Alm Rav, MD  REFERRING PROVIDER: Lonni CINDERELLA Poli, MD   REFERRING DIAG: [phone] (ICD-10-CM) - Pain in right hip  THERAPY DIAG:  No diagnosis found.  Rationale for Evaluation and Treatment: Rehabilitation  ONSET DATE: 6 months ago ; off and on for about a year   SUBJECTIVE:   SUBJECTIVE STATEMENT: I hate walking with a limp.  PERTINENT HISTORY: Patient has undergone right THA in 2015 and is now experiencing right hip pain to include trochanteric bursitis. She has received a cortisone shot for this problem in the past which helped in the short term. MD sent her to PT to see if PT can resolve/assist pain prior to doing another injection.   PAIN:  Are you having pain? Yes: NPRS scale: 0/10 at rest, 7/10 at worst Pain location: right hip  Pain description: throbbing Aggravating factors: nothing makes it worse, it is just constant Relieving factors: time, massaging, voltaren (used but doesn't work)  PRECAUTIONS: None  RED FLAGS: None   WEIGHT BEARING RESTRICTIONS: No  FALLS:  Has patient fallen in last 6 months? 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  DIAGNOSTIC FINDINGS: An AP pelvis and lateral of the right hip shows a total hip arthroplasty  with no complicating features.   PATIENT SURVEYS:  PSFS: THE PATIENT SPECIFIC FUNCTIONAL SCALE  Place score of 0-10 (0 = unable to perform activity and 10 = able to perform activity at the same level as before injury or problem)  Activity Date: 02/01/2024    Get on my knees   6    2. Walk without a limp   4    3.     4.      Total Score 5      Total Score = Sum of activity scores/number of activities  Minimally Detectable Change: 3 points (for single activity); 2 points (for average score)  COGNITION: 02/01/2024 Overall cognitive status: Within functional limits for tasks assessed     SENSATION: 02/01/2024 Light touch: WFL  EDEMA:  02/01/2024 Not tested on eval  MUSCLE LENGTH: 02/01/2024 Not tested on eval   POSTURE: 02/01/2024  rounded shoulders, forward head, and increased thoracic kyphosis  PALPATION: 02/01/2024 TTP at right greater trochanter   LOWER EXTREMITY ROM:  ROM Right Eval 02/01/2024 Left  Eval 02/01/2024  Hip flexion    Hip extension    Hip abduction    Hip adduction    Hip internal rotation    Hip external rotation    Knee flexion    Knee extension    Ankle dorsiflexion    Ankle plantarflexion    Ankle inversion    Ankle eversion     (Blank rows = not tested)  LOWER EXTREMITY MMT:  MMT Right Eval 02/01/2024 Left Eval 02/01/2024  Hip flexion 3+/5 3+/5  Hip extension 3/5 3/5  Hip abduction 3+/5 3+/5  Hip adduction    Hip internal rotation    Hip external rotation    Knee flexion 4+/5 5/5  Knee extension 5/5 5/5  Ankle dorsiflexion    Ankle plantarflexion    Ankle inversion    Ankle eversion     (Blank rows = not tested)  LOWER EXTREMITY SPECIAL TESTS:  02/01/2024 Hip special tests: Belvie (FABER) test: negative, Ober's test: negative, and FADIR: negative   FUNCTIONAL TESTS:   02/01/2024 Did not assess on eval   GAIT: 02/01/2024 Distance walked: not formally assessed  Assistive device utilized: None Level of assistance: supervision Comments: antalgic gait pattern, Rt lateral lean with right stance, decreased stance time on Rt LE                                                                                                                                 TREATMENT        DATE: 02/07/2024 Therex:     TREATMENT        DATE: 02/01/2024 TherEx:   HEP handout provided with patient performing one set of each exercise for appropriate form. Verbal and tactile cues required.   PATIENT EDUCATION:  Education details: HEP, plan of care, and timeline goals  Person educated: Patient Education method: Explanation, Demonstration, Tactile cues, Verbal cues, and Handouts Education comprehension: verbalized understanding, returned demonstration, verbal cues required, and tactile cues required  HOME EXERCISE PROGRAM: Access Code: [email or web address] URL: [email or web address] Date: 02/01/2024 Prepared by: Susannah Daring  Exercises - Supine Bridge  - 1 x daily - 7 x weekly - 2 sets - 10 reps - 2-3s hold - Standing Hip Abduction with Counter Support  - 1 x daily - 7 x weekly - 2 sets - 10 reps - Standing Hip Extension with Counter Support  - 1 x daily - 7 x weekly - 2 sets - 10 reps - Mini Squat with Counter Support  - 1 x daily - 7 x weekly - 2 sets - 10 reps - Supine Active Straight Leg Raise  - 1 x daily - 7 x weekly - 2 sets - 10 reps - 2-3s hold  ASSESSMENT:  CLINICAL IMPRESSION: Patient is a 68 y.o. F who was seen today for physical therapy evaluation and treatment for right hip pain secondary to trochanteric bursitis with functional mobility deficits, strength deficits, pain, and gait deficits.  Patient notes that her pain is chronic and is motivated to return to her typical activities without a limp and pain. Patient also arrived late to evaluation. Patient  will benefit from skilled PT to address above noted impairments.   OBJECTIVE IMPAIRMENTS: Abnormal gait, decreased activity tolerance, decreased balance, decreased mobility, difficulty walking, decreased ROM, decreased strength, improper body mechanics, and postural dysfunction.   ACTIVITY LIMITATIONS: bending, standing, and squatting  PARTICIPATION LIMITATIONS: community activity and occupation  PERSONAL FACTORS: 3+ comorbidities: anemia, DM2, arthritis, asthma, glaucoma, GERD, prior TIA, HTN, kidney disease are also affecting patient's functional outcome.   REHAB POTENTIAL: Good  CLINICAL DECISION MAKING: Stable/uncomplicated  EVALUATION COMPLEXITY: Low   GOALS: Goals reviewed with patient? Yes  SHORT TERM GOALS: Target date: 02/22/2024 Patient will be compliant with initial HEP.  Baseline: Goal status: INITIAL  2.  Patient will report pain level no greater than 4/10 to show improved overall quality of life. Baseline:  Goal status: INITIAL   LONG TERM GOALS: Target date: 03/28/2024  Patient will be independent with final HEP to maintain and progress upon functional gains made within PT.  Baseline:  Goal status: INITIAL  2.  Patient will increase PSFS to at least 7 to show a significant improvement in subjective disability rating. Baseline:  Goal status: INITIAL  3.  Patient will report pain levels no greater than 2/10 in order to show improved overall quality of life. Baseline:  Goal status: INITIAL  4.  Patient will increase bilat hip abduction MMT to at least 4+/5 in order to show improved biomechanics with functional mobility. Baseline:  Goal status: INITIAL  5.  Patient will increase bilat hip extension MMT to at least 4/5 in order to show improved biomechanics with functional mobility.  Baseline:  Goal status: INITIAL    PLAN:  PT FREQUENCY: 1x/week  PT DURATION: 8 weeks  PLANNED INTERVENTIONS: [id number]- PT Re-evaluation, 97750- Physical Performance  Testing, 97110-Therapeutic exercises, 97530- Therapeutic activity, [id number]- Neuromuscular re-education, 97535- Self Care, [id number]- Manual therapy, [phone]- Gait training, [phone]- Electrical stimulation (unattended), [phone]- Electrical stimulation (manual), [id number]- Vasopneumatic device, [id number]- Ultrasound, [id number]- Traction (mechanical), [id number]- Ionotophoresis 4mg /ml Dexamethasone , [id number] (1-2 muscles), [id number] (3+ muscles)- Dry Needling, Patient/Family education, Balance training, Stair training, Taping, Joint mobilization, Joint manipulation, Spinal manipulation, Spinal mobilization, Scar mobilization, Cryotherapy, and Moist heat  PLAN FOR NEXT SESSION: assess HEP, assess floor transfer, more in depth gait assessment, hip musculature strengthening, assess hamstring/hip flexor length    Ozell Silvan, PT, DPT, OCS, ATC 02/07/24  8:39 AM

## 2024-02-15 ENCOUNTER — Ambulatory Visit (INDEPENDENT_AMBULATORY_CARE_PROVIDER_SITE_OTHER): Admitting: Rehabilitative and Restorative Service Providers"

## 2024-02-15 ENCOUNTER — Encounter: Payer: Self-pay | Admitting: Rehabilitative and Restorative Service Providers"

## 2024-02-15 DIAGNOSIS — M25551 Pain in right hip: Secondary | ICD-10-CM | POA: Diagnosis not present

## 2024-02-15 DIAGNOSIS — M6281 Muscle weakness (generalized): Secondary | ICD-10-CM | POA: Diagnosis not present

## 2024-02-15 DIAGNOSIS — M25651 Stiffness of right hip, not elsewhere classified: Secondary | ICD-10-CM | POA: Diagnosis not present

## 2024-02-15 DIAGNOSIS — R262 Difficulty in walking, not elsewhere classified: Secondary | ICD-10-CM

## 2024-02-15 DIAGNOSIS — R2689 Other abnormalities of gait and mobility: Secondary | ICD-10-CM

## 2024-02-15 NOTE — Therapy (Signed)
 OUTPATIENT PHYSICAL THERAPY TREATMENT   Patient Name: [name] MRN: [id number] DOB:04/24/1956, 68 y.o., female Today's Date: 02/15/2024  END OF SESSION:  PT End of Session - 02/15/24 1524     Visit Number 2    Number of Visits 10    Date for PT Re-Evaluation 03/28/24    Authorization Type [employer] STATE $[id number]    Progress Note Due on Visit 10    PT Start Time 1516    PT Stop Time 1555    PT Time Calculation (min) 39 min    Activity Tolerance Patient tolerated treatment well    Behavior During Therapy [facility] for tasks assessed/performed           Past Medical History:  Diagnosis Date   Anemia 2006   required transfusion post TAH/BSO 05/2005   Arthritis    Asthma    Chronic cough    Colon polyps    hyperplastic   Diabetes mellitus without complication (HCC)    Diverticulosis    GERD (gastroesophageal reflux disease)    on prilosec   GLA deficiency (HCC)    Glaucoma of both eyes    Hypertension    Joint pain    Osteoarthritis    Pancreatitis    Shortness of breath dyspnea    Stroke [facility])    TIA 2022   Vitamin D  deficiency    Past Surgical History:  Procedure Laterality Date   BICEPT TENODESIS Right 11/19/2022   Procedure: BICEPS TENODESIS;  Surgeon: Addie Cordella Hamilton, MD;  Location: [facility] OR;  Service: Orthopedics;  Laterality: Right;   BREAST EXCISIONAL BIOPSY Right 1990   COLONOSCOPY N/A 08/22/2013   Procedure: COLONOSCOPY;  Surgeon: Norleen LOISE Kiang, MD;  Location: [facility] ENDOSCOPY;  Service: Endoscopy;  Laterality: N/A;   ESOPHAGOGASTRODUODENOSCOPY N/A 08/22/2013   Procedure: ESOPHAGOGASTRODUODENOSCOPY (EGD);  Surgeon: Norleen LOISE Kiang, MD;  Location: [facility] ENDOSCOPY;  Service: Endoscopy;  Laterality: N/A;   ESOPHAGOGASTRODUODENOSCOPY (EGD) WITH PROPOFOL  N/A 08/19/2015   Procedure: ESOPHAGOGASTRODUODENOSCOPY (EGD) WITH PROPOFOL ;  Surgeon: Norleen LOISE Kiang, MD;  Location: [facility] ENDOSCOPY;  Service: Endoscopy;  Laterality: N/A;   HYSTEROSCOPY WITH D & C  01/2005   for uterine  fibroids.    INSERTION OF MESH N/A 08/24/2013   Procedure: INSERTION OF MESH;  Surgeon: Morene ONEIDA Olives, MD;  Location: [facility] OR;  Service: General;  Laterality: N/A;   LIPOMA EXCISION Left 06/21/2015   Procedure: EXCISION OF LEFT SCALP LIPOMA;  Surgeon: Donnice Lunger, MD;  Location: [facility];  Service: General;  Laterality: Left;   PANNICULECTOMY N/A 08/24/2013   Procedure: PANNICULECTOMY;  Surgeon: Morene ONEIDA Olives, MD;  Location: [facility] OR;  Service: General;  Laterality: N/A;   REVERSE SHOULDER ARTHROPLASTY Right 11/19/2022   Procedure: REVERSE SHOULDER ARTHROPLASTY, LIPOMA REMOVAL;  Surgeon: Addie Cordella Hamilton, MD;  Location: [facility] OR;  Service: Orthopedics;  Laterality: Right;   TOTAL ABDOMINAL HYSTERECTOMY W/ BILATERAL SALPINGOOPHORECTOMY  05/2005   TOTAL HIP ARTHROPLASTY Right 12/12/2013   Procedure: RIGHT TOTAL HIP ARTHROPLASTY ANTERIOR APPROACH;  Surgeon: Lonni CINDERELLA Poli, MD;  Location: [facility] OR;  Service: Orthopedics;  Laterality: Right;   VENTRAL HERNIA REPAIR N/A 08/24/2013   Procedure: HERNIA REPAIR VENTRAL ADULT;  Surgeon: Morene ONEIDA Olives, MD;  Location: [facility] OR;  Service: General;  Laterality: N/A;   Patient Active Problem List   Diagnosis Date Noted   Arthritis of right shoulder region 11/22/2022   Biceps tendonitis on right 11/22/2022   Lipoma of right shoulder 11/22/2022  S/P reverse total shoulder arthroplasty, right 11/19/2022   Poorly controlled persistent asthma 02/27/2022   Pain in right shoulder 02/09/2022   Asthma exacerbation 09/19/2020   Anemia 10/13/2019   Glaucoma 10/13/2019   Hypertensive disorder 10/13/2019   Uterine leiomyoma 10/13/2019   Vitamin D  insufficiency 04/26/2019   TIA (transient ischemic attack) 03/30/2019   Hyperlipidemia 03/30/2019   Thyroid  nodule 03/30/2019   Stroke-like episode s/p IV tPA 03/28/2019   GERD (gastroesophageal reflux disease) 06/19/2018   Constipation 06/19/2018   Other fatigue 08/02/2017    Shortness of breath on exertion 08/02/2017   Mass of axilla 07/07/2017   Abdominal pain, epigastric    Abnormal CT of the abdomen    Pancreatitis, acute    Acute pancreatitis 08/13/2015   Lower urinary tract infectious disease 08/13/2015   Severe obesity (BMI >= 40) (HCC) 01/23/2015   Sinusitis, chronic 01/08/2015   Cough variant asthma 01/03/2015   Arthritis of right hip 12/12/2013   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   Benign neoplasm of colon 08/22/2013   Special screening for malignant neoplasms, colon 08/22/2013   Abdominal pain 08/19/2013   Diabetes mellitus without complication (HCC) 08/19/2013   Essential hypertension, benign 08/19/2013   Ventral hernia 08/19/2013    PCP: Alm Rav, MD  REFERRING PROVIDER: Lonni CINDERELLA Poli, MD   REFERRING DIAG: [id number] (ICD-10-CM) - Pain in right hip  THERAPY DIAG:  Pain in right hip  Difficulty in walking, not elsewhere classified  Stiffness of right hip, not elsewhere classified  Muscle weakness (generalized)  Other abnormalities of gait and mobility  Rationale for Evaluation and Treatment: Rehabilitation  ONSET DATE: 6 months ago ; off and on for about a year   SUBJECTIVE:   SUBJECTIVE STATEMENT: Reported having 4/10 with walking.  Hip hurts more with walking.   PERTINENT HISTORY: Patient has undergone right THA in 2015 and is now experiencing right hip pain to include trochanteric bursitis. She has received a cortisone shot for this problem in the past which helped in the short term. MD sent her to PT to see if PT can resolve/assist pain prior to doing another injection.   PAIN:  NPRS scale: 4/10 Pain location: right hip  Pain description: throbbing Aggravating factors: nothing makes it worse, it is just constant Relieving factors: time, massaging, voltaren (used but doesn't work)  PRECAUTIONS: None  RED FLAGS: None   WEIGHT BEARING RESTRICTIONS: No  FALLS:  Has patient fallen in  last 6 months? 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  DIAGNOSTIC FINDINGS: An AP pelvis and lateral of the right hip shows a total hip arthroplasty  with no complicating features.   PATIENT SURVEYS:  PSFS: THE PATIENT SPECIFIC FUNCTIONAL SCALE  Place score of 0-10 (0 = unable to perform activity and 10 = able to perform activity at the same level as before injury or problem)  Activity Date: 02/01/2024    Get on my knees   6    2. Walk without  a limp   4    3.     4.      Total Score 5      Total Score = Sum of activity scores/number of activities  Minimally Detectable Change: 3 points (for single activity); 2 points (for average score)  COGNITION: 02/01/2024 Overall cognitive status: Within functional limits for tasks assessed     SENSATION: 02/01/2024 Light touch: WFL  EDEMA:  02/01/2024 Not tested on eval  MUSCLE LENGTH: 02/01/2024 Not tested on eval   POSTURE: 02/01/2024  rounded shoulders, forward head, and increased thoracic kyphosis  PALPATION: 02/01/2024 TTP at right greater trochanter   LOWER EXTREMITY ROM:  ROM Right Eval 02/01/2024 Left Eval 02/01/2024  Hip flexion    Hip extension    Hip abduction    Hip adduction    Hip internal rotation    Hip external rotation    Knee flexion    Knee extension    Ankle dorsiflexion    Ankle plantarflexion    Ankle inversion    Ankle eversion     (Blank rows = not  tested)  LOWER EXTREMITY MMT:  MMT Right Eval 02/01/2024 Left Eval 02/01/2024 Right 02/15/2024  Hip flexion 3+/5 3+/5   Hip extension 3/5 3/5   Hip abduction 3+/5 3+/5 3+/5  Hip adduction     Hip internal rotation     Hip external rotation     Knee flexion 4+/5 5/5   Knee extension 5/5 5/5   Ankle dorsiflexion     Ankle plantarflexion     Ankle inversion     Ankle eversion      (Blank rows = not tested)  LOWER EXTREMITY SPECIAL TESTS:  02/01/2024 Hip special tests: Belvie (FABER) test: negative, Ober's test: negative, and FADIR: negative   FUNCTIONAL TESTS:  02/01/2024 Did not assess on eval   GAIT: 02/01/2024 Distance walked: not formally assessed  Assistive device utilized: None Level of assistance: supervision Comments: antalgic gait pattern, Rt lateral lean with right stance, decreased stance time on Rt LE                                                                                                                                                 TREATMENT        DATE: 02/15/2024 Manual Percussive device to Rt glute med/min/max  Therex: Sidelying clam shell Rt leg 2 x 10 Supine bridge 2-3 sec hold 2 x 10  Supine figure 4 pull towards 30 sec x 5 Rt  Supine SLR 2 x 5, performed bilaterally (noted difficulty using Rt leg).  Sit to stand to sit 18 inch chair x 10 for LE strength Nustep Lvl 5 10 mins UE/LE for endurance, ROM    TREATMENT        DATE: 02/01/2024 TherEx:   HEP handout provided with patient performing one  set of each exercise for appropriate form. Verbal and tactile cues required.   PATIENT EDUCATION:  02/15/2024 Education details: HEP update Person educated: Patient Education method: Explanation, Demonstration, Tactile cues, Verbal cues, and Handouts Education comprehension: verbalized understanding, returned demonstration, verbal cues required, and tactile cues required  HOME EXERCISE PROGRAM: Access Code: [email or web address] URL:  [email or web address] Date: 02/15/2024 Prepared by: Ozell Silvan  Exercises - Supine Bridge  - 1 x daily - 7 x weekly - 2 sets - 10 reps - 2-3s hold - Supine Piriformis Stretch with Foot on Ground  - 1-2 x daily - 7 x weekly - 1 sets - 5 reps - 30 hold - Clamshell (Mirrored)  - 1-2 x daily - 7 x weekly - 2-3 sets - 10-15 reps - Standing Hip Abduction with Counter Support  - 1 x daily - 7 x weekly - 2 sets - 10 reps - Standing Hip Extension with Counter Support  - 1 x daily - 7 x weekly - 2 sets - 10 reps - Mini Squat with Counter Support  - 1 x daily - 7 x weekly - 2 sets - 10 reps - Supine Active Straight Leg Raise  - 1 x daily - 7 x weekly - 2 sets - 10 reps - 2-3s hold  ASSESSMENT:  CLINICAL IMPRESSION: Presentation today showed continued trigger points with concordant symptoms Rt lateral hip.  Included manual  to address.  Continued skilled PT services and HEP to address pain complaints and improve strength to reach goals for functional activity.   OBJECTIVE IMPAIRMENTS: Abnormal gait, decreased activity tolerance, decreased balance, decreased mobility, difficulty walking, decreased ROM, decreased strength, improper body mechanics, and postural dysfunction.   ACTIVITY LIMITATIONS: bending, standing, and squatting  PARTICIPATION LIMITATIONS: community activity and occupation  PERSONAL FACTORS: 3+ comorbidities: anemia, DM2, arthritis, asthma, glaucoma, GERD, prior TIA, HTN, kidney disease are also affecting patient's functional outcome.   REHAB POTENTIAL: Good  CLINICAL DECISION MAKING: Stable/uncomplicated  EVALUATION COMPLEXITY: Low   GOALS: Goals reviewed with patient? Yes  SHORT TERM GOALS: Target date: 02/22/2024 Patient will be compliant with initial HEP.  Baseline: Goal status: on going 02/15/2024  2.  Patient will report pain level no greater than 4/10 to show improved overall quality of life. Baseline:  Goal status: on going 02/15/2024   LONG TERM  GOALS: Target date: 03/28/2024  Patient will be independent with final HEP to maintain and progress upon functional gains made within PT.  Baseline:  Goal status: INITIAL  2.  Patient will increase PSFS to at least 7 to show a significant improvement in subjective disability rating. Baseline:  Goal status: INITIAL  3.  Patient will report pain levels no greater than 2/10 in order to show improved overall quality of life. Baseline:  Goal status: INITIAL  4.  Patient will increase bilat hip abduction MMT to at least 4+/5 in order to show improved biomechanics with functional mobility. Baseline:  Goal status: INITIAL  5.  Patient will increase bilat hip extension MMT to at least 4/5 in order to show improved biomechanics with functional mobility.  Baseline:  Goal status: INITIAL    PLAN:  PT FREQUENCY: 1x/week  PT DURATION: 8 weeks  PLANNED INTERVENTIONS: [id number]- PT Re-evaluation, 97750- Physical Performance Testing, 97110-Therapeutic exercises, 97530- Therapeutic activity, [id number]- Neuromuscular re-education, 97535- Self Care, [id number]- Manual therapy, [id number]- Gait training, [phone]- Electrical stimulation (unattended), [id number]- Electrical stimulation (manual), [id number]- Vasopneumatic device, [id number]- Ultrasound, [id number]- Traction (mechanical), [id number]- Ionotophoresis 4mg /ml Dexamethasone , [id number] (1-2 muscles), [id number] (3+  muscles)- Dry Needling, Patient/Family education, Balance training, Stair training, Taping, Joint mobilization, Joint manipulation, Spinal manipulation, Spinal mobilization, Scar mobilization, Cryotherapy, and Moist heat  PLAN FOR NEXT SESSION: Continue manual as needed for symptom relief.  Improve hip strength/control.    Ozell Silvan, PT, DPT, OCS, ATC 02/15/24  3:49 PM

## 2024-02-19 ENCOUNTER — Other Ambulatory Visit: Payer: Self-pay | Admitting: Surgical

## 2024-02-22 NOTE — Therapy (Signed)
 OUTPATIENT PHYSICAL THERAPY TREATMENT   Patient Name: [name] MRN: [id number] DOB:1955/10/13, 68 y.o., female Today's Date: 02/23/2024  END OF SESSION:  PT End of Session - 02/23/24 1546     Visit Number 3    Number of Visits 10    Date for PT Re-Evaluation 03/28/24    Authorization Type [employer] STATE $52    Progress Note Due on Visit 10    PT Start Time 1505    PT Stop Time 1543    PT Time Calculation (min) 38 min    Activity Tolerance Patient tolerated treatment well    Behavior During Therapy [facility] for tasks assessed/performed            Past Medical History:  Diagnosis Date   Anemia 2006   required transfusion post TAH/BSO 05/2005   Arthritis    Asthma    Chronic cough    Colon polyps    hyperplastic   Diabetes mellitus without complication (HCC)    Diverticulosis    GERD (gastroesophageal reflux disease)    on prilosec   GLA deficiency (HCC)    Glaucoma of both eyes    Hypertension    Joint pain    Osteoarthritis    Pancreatitis    Shortness of breath dyspnea    Stroke [facility])    TIA 2022   Vitamin D  deficiency    Past Surgical History:  Procedure Laterality Date   BICEPT TENODESIS Right 11/19/2022   Procedure: BICEPS TENODESIS;  Surgeon: Addie Cordella Hamilton, MD;  Location: [facility] OR;  Service: Orthopedics;  Laterality: Right;   BREAST EXCISIONAL BIOPSY Right 1990   COLONOSCOPY N/A 08/22/2013   Procedure: COLONOSCOPY;  Surgeon: Norleen LOISE Kiang, MD;  Location: [facility] ENDOSCOPY;  Service: Endoscopy;  Laterality: N/A;   ESOPHAGOGASTRODUODENOSCOPY N/A 08/22/2013   Procedure: ESOPHAGOGASTRODUODENOSCOPY (EGD);  Surgeon: Norleen LOISE Kiang, MD;  Location: [facility] ENDOSCOPY;  Service: Endoscopy;  Laterality: N/A;   ESOPHAGOGASTRODUODENOSCOPY (EGD) WITH PROPOFOL  N/A 08/19/2015   Procedure: ESOPHAGOGASTRODUODENOSCOPY (EGD) WITH PROPOFOL ;  Surgeon: Norleen LOISE Kiang, MD;  Location: [facility] ENDOSCOPY;  Service: Endoscopy;  Laterality: N/A;   HYSTEROSCOPY WITH D & C  01/2005   for uterine  fibroids.    INSERTION OF MESH N/A 08/24/2013   Procedure: INSERTION OF MESH;  Surgeon: Morene ONEIDA Olives, MD;  Location: [facility] OR;  Service: General;  Laterality: N/A;   LIPOMA EXCISION Left 06/21/2015   Procedure: EXCISION OF LEFT SCALP LIPOMA;  Surgeon: Donnice Lunger, MD;  Location: [facility];  Service: General;  Laterality: Left;   PANNICULECTOMY N/A 08/24/2013   Procedure: PANNICULECTOMY;  Surgeon: Morene ONEIDA Olives, MD;  Location: [facility] OR;  Service: General;  Laterality: N/A;   REVERSE SHOULDER ARTHROPLASTY Right 11/19/2022   Procedure: REVERSE SHOULDER ARTHROPLASTY, LIPOMA REMOVAL;  Surgeon: Addie Cordella Hamilton, MD;  Location: [facility] OR;  Service: Orthopedics;  Laterality: Right;   TOTAL ABDOMINAL HYSTERECTOMY W/ BILATERAL SALPINGOOPHORECTOMY  05/2005   TOTAL HIP ARTHROPLASTY Right 12/12/2013   Procedure: RIGHT TOTAL HIP ARTHROPLASTY ANTERIOR APPROACH;  Surgeon: Lonni CINDERELLA Poli, MD;  Location: [facility] OR;  Service: Orthopedics;  Laterality: Right;   VENTRAL HERNIA REPAIR N/A 08/24/2013   Procedure: HERNIA REPAIR VENTRAL ADULT;  Surgeon: Morene ONEIDA Olives, MD;  Location: [facility] OR;  Service: General;  Laterality: N/A;   Patient Active Problem List   Diagnosis Date Noted   Arthritis of right shoulder region 11/22/2022   Biceps tendonitis on right 11/22/2022   Lipoma of right shoulder 11/22/2022  S/P reverse total shoulder arthroplasty, right 11/19/2022   Poorly controlled persistent asthma 02/27/2022   Pain in right shoulder 02/09/2022   Asthma exacerbation 09/19/2020   Anemia 10/13/2019   Glaucoma 10/13/2019   Hypertensive disorder 10/13/2019   Uterine leiomyoma 10/13/2019   Vitamin D  insufficiency 04/26/2019   TIA (transient ischemic attack) 03/30/2019   Hyperlipidemia 03/30/2019   Thyroid  nodule 03/30/2019   Stroke-like episode s/p IV tPA 03/28/2019   GERD (gastroesophageal reflux disease) 06/19/2018   Constipation 06/19/2018   Other fatigue 08/02/2017    Shortness of breath on exertion 08/02/2017   Mass of axilla 07/07/2017   Abdominal pain, epigastric    Abnormal CT of the abdomen    Pancreatitis, acute    Acute pancreatitis 08/13/2015   Lower urinary tract infectious disease 08/13/2015   Severe obesity (BMI >= 40) (HCC) 01/23/2015   Sinusitis, chronic 01/08/2015   Cough variant asthma 01/03/2015   Arthritis of right hip 12/12/2013   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   Benign neoplasm of colon 08/22/2013   Special screening for malignant neoplasms, colon 08/22/2013   Abdominal pain 08/19/2013   Diabetes mellitus without complication (HCC) 08/19/2013   Essential hypertension, benign 08/19/2013   Ventral hernia 08/19/2013    PCP: Alm Rav, MD  REFERRING PROVIDER: Lonni CINDERELLA Poli, MD   REFERRING DIAG: [phone] (ICD-10-CM) - Pain in right hip  THERAPY DIAG:  Muscle weakness (generalized)  Pain in right hip  Difficulty in walking, not elsewhere classified  Stiffness of right hip, not elsewhere classified  Rationale for Evaluation and Treatment: Rehabilitation  ONSET DATE: 6 months ago ; off and on for about a year   SUBJECTIVE:   SUBJECTIVE STATEMENT: Pt tates hip is  about the same.  Hard to get the HEP done.  PERTINENT HISTORY: Patient has undergone right THA in 2015 and is now experiencing right hip pain to include trochanteric bursitis. She has received a cortisone shot for this problem in the past which helped in the short term. MD sent her to PT to see if PT can resolve/assist pain prior to doing another injection.   PAIN:  NPRS scale: 4/10 Pain location: right hip  Pain description: throbbing Aggravating factors: nothing makes it worse, it is just constant Relieving factors: time, massaging, voltaren (used but doesn't work)  PRECAUTIONS: None  RED FLAGS: None   WEIGHT BEARING RESTRICTIONS: No  FALLS:  Has patient fallen in last 6 months? 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  DIAGNOSTIC FINDINGS: An AP pelvis and lateral of the right hip shows a total hip arthroplasty  with no complicating features.   PATIENT SURVEYS:  PSFS: THE PATIENT SPECIFIC FUNCTIONAL SCALE  Place score of 0-10 (0 = unable to perform activity and 10 = able to perform activity at the same level as before injury or problem)  Activity Date: 02/01/2024    Get on my knees   6    2. Walk without a limp  4    3.     4.      Total Score 5      Total Score = Sum of activity scores/number of activities  Minimally Detectable Change: 3 points (for single activity); 2 points (for average score)  COGNITION: 02/01/2024 Overall cognitive status: Within functional limits for tasks assessed     SENSATION: 02/01/2024 Light touch: WFL  EDEMA:  02/01/2024 Not tested on eval  MUSCLE LENGTH: 02/01/2024 Not tested on eval   POSTURE: 02/01/2024  rounded shoulders, forward head, and increased thoracic kyphosis  PALPATION: 02/01/2024 TTP at right greater trochanter   LOWER EXTREMITY ROM:  ROM Right Eval 02/01/2024 Left Eval 02/01/2024  Hip flexion    Hip extension    Hip abduction    Hip adduction    Hip internal rotation    Hip external rotation    Knee flexion    Knee extension    Ankle dorsiflexion    Ankle plantarflexion    Ankle inversion    Ankle eversion     (Blank rows = not tested)  LOWER EXTREMITY MMT:  MMT  Right Eval 02/01/2024 Left Eval 02/01/2024 Right 02/15/2024  Hip flexion 3+/5 3+/5   Hip extension 3/5 3/5   Hip abduction 3+/5 3+/5 3+/5  Hip adduction     Hip internal rotation     Hip external rotation     Knee flexion 4+/5 5/5   Knee extension 5/5 5/5   Ankle dorsiflexion     Ankle plantarflexion     Ankle inversion     Ankle eversion      (Blank rows = not tested)  LOWER EXTREMITY SPECIAL TESTS:  02/01/2024 Hip special tests: Belvie (FABER) test: negative, Ober's test: negative, and FADIR: negative   FUNCTIONAL TESTS:  02/01/2024 Did not assess on eval   GAIT: 02/01/2024 Distance walked: not formally assessed  Assistive device utilized: None Level of assistance: supervision Comments: antalgic gait pattern, Rt lateral lean with right stance, decreased stance time on Rt LE                  TREATMENT        DATE: 02/23/24  There Ex for ROM, strength Rec bike lev 2 8 min SAQ 2x10 bil  2# Sidelying clam shell Rt leg 2 x 10 There Act for functional activities - bending, standing, squatting Mini squats  Sit to stand 3x5 with 2# ball at 18  Neuro Re Ed for balance and posture Supine bridge 2-3 sec hold 2 x 10  Heel to Toe balance EO 30x2 each side  Manual Percussive device to Rt glute med/min/max                                                                                                                              TREATMENT        DATE: 02/15/2024 Manual Percussive device to Rt glute med/min/max  Therex: Sidelying clam shell  Rt leg 2 x 10 Supine bridge 2-3 sec hold 2 x 10  Supine figure 4 pull towards 30 sec x 5 Rt  Supine SLR 2 x 5, performed bilaterally (noted difficulty using Rt leg).  Sit to stand to sit 18 inch chair x 10 for LE strength Nustep Lvl 5 10 mins UE/LE for endurance, ROM    TREATMENT        DATE: 02/01/2024 TherEx:   HEP handout provided with patient performing one set of each exercise for appropriate form. Verbal and tactile cues  required.   PATIENT EDUCATION:  02/15/2024 Education details: HEP update Person educated: Patient Education method: Explanation, Demonstration, Tactile cues, Verbal cues, and Handouts Education comprehension: verbalized understanding, returned demonstration, verbal cues required, and tactile cues required  HOME EXERCISE PROGRAM: Access Code: [email or web address] URL: [email or web address] Date: 02/15/2024 Prepared by: Ozell Silvan  Exercises - Supine Bridge  - 1 x daily - 7 x weekly - 2 sets - 10 reps - 2-3s hold - Supine Piriformis Stretch with Foot on Ground  - 1-2 x daily - 7 x weekly - 1 sets - 5 reps - 30 hold - Clamshell (Mirrored)  - 1-2 x daily - 7 x weekly - 2-3 sets - 10-15 reps - Standing Hip Abduction with Counter Support  - 1 x daily - 7 x weekly - 2 sets - 10 reps - Standing Hip Extension with Counter Support  - 1 x daily - 7 x weekly - 2 sets - 10 reps - Mini Squat with Counter Support  - 1 x daily - 7 x weekly - 2 sets - 10 reps - Supine Active Straight Leg Raise  - 1 x daily - 7 x weekly - 2 sets - 10 reps - 2-3s hold  ASSESSMENT:  CLINICAL IMPRESSION: Pt needed VC for squatting form.  Demonstrated understanding.  OBJECTIVE IMPAIRMENTS: Abnormal gait, decreased activity tolerance, decreased balance, decreased mobility, difficulty walking, decreased ROM, decreased strength, improper body mechanics, and postural dysfunction.   ACTIVITY LIMITATIONS: bending, standing, and squatting  PARTICIPATION LIMITATIONS: community activity and occupation  PERSONAL FACTORS: 3+ comorbidities: anemia, DM2, arthritis, asthma, glaucoma, GERD, prior TIA, HTN, kidney disease are also affecting patient's functional outcome.   REHAB POTENTIAL: Good  CLINICAL DECISION MAKING: Stable/uncomplicated  EVALUATION COMPLEXITY: Low   GOALS: Goals reviewed with patient? Yes  SHORT TERM GOALS: Target date: 02/22/2024 Patient will be compliant with initial HEP.  Baseline: Goal  status: on going 02/15/2024  2.  Patient will report pain level no greater than 4/10 to show improved overall quality of life. Baseline:  Goal status: on going 02/15/2024   LONG TERM GOALS: Target date: 03/28/2024  Patient will be independent with final HEP to maintain and progress upon functional gains made within PT.  Baseline:  Goal status: INITIAL  2.  Patient will increase PSFS to at least 7 to show a significant improvement in subjective disability rating. Baseline:  Goal status: INITIAL  3.  Patient will report pain levels no greater than 2/10 in order to show improved overall quality of life. Baseline:  Goal status: INITIAL  4.  Patient will increase bilat hip abduction MMT to at least 4+/5 in order to show improved biomechanics with functional mobility. Baseline:  Goal status: INITIAL  5.  Patient will increase bilat hip extension MMT to at least 4/5 in order to show improved biomechanics with functional mobility.  Baseline:  Goal status: INITIAL    PLAN:  PT FREQUENCY: 1x/week  PT DURATION: 8  weeks  PLANNED INTERVENTIONS: [id number]- PT Re-evaluation, 97750- Physical Performance Testing, 97110-Therapeutic exercises, 97530- Therapeutic activity, [id number]- Neuromuscular re-education, 97535- Self Care, [id number]- Manual therapy, [phone]- Gait training, [phone]- Electrical stimulation (unattended), [phone]- Electrical stimulation (manual), [id number]- Vasopneumatic device, [id number]- Ultrasound, [id number]- Traction (mechanical), [id number]- Ionotophoresis 4mg /ml Dexamethasone , [id number] (1-2 muscles), [id number] (3+ muscles)- Dry Needling, Patient/Family education, Balance training, Stair training, Taping, Joint mobilization, Joint manipulation, Spinal manipulation, Spinal mobilization, Scar mobilization, Cryotherapy, and Moist heat  PLAN FOR NEXT SESSION: Continue manual as needed for symptom relief.  Improve hip strength/control.    Burnard Meth, PT 02/23/24  3:47 PM

## 2024-02-23 ENCOUNTER — Ambulatory Visit: Admitting: Orthopaedic Surgery

## 2024-02-23 ENCOUNTER — Encounter: Payer: Self-pay | Admitting: Orthopaedic Surgery

## 2024-02-23 ENCOUNTER — Ambulatory Visit

## 2024-02-23 DIAGNOSIS — R262 Difficulty in walking, not elsewhere classified: Secondary | ICD-10-CM

## 2024-02-23 DIAGNOSIS — M25551 Pain in right hip: Secondary | ICD-10-CM

## 2024-02-23 DIAGNOSIS — M6281 Muscle weakness (generalized): Secondary | ICD-10-CM

## 2024-02-23 DIAGNOSIS — M7061 Trochanteric bursitis, right hip: Secondary | ICD-10-CM

## 2024-02-23 DIAGNOSIS — Z96641 Presence of right artificial hip joint: Secondary | ICD-10-CM

## 2024-02-23 DIAGNOSIS — M25651 Stiffness of right hip, not elsewhere classified: Secondary | ICD-10-CM | POA: Diagnosis not present

## 2024-02-23 NOTE — Progress Notes (Signed)
 The patient is following up after having physical therapy on her right hip secondary to trochanteric bursitis and IT band pain.  We had replaced her hip remotely many years ago.  We did place a steroid injection over the trochanteric area back in April of this year.  She says she still limping but the PT has been helpful and she does not feel like she needs an injection today.  Since she is here she will and we did take a look at her right shoulder.  She has a history of a reverse shoulder arthroplasty by Dr. Addie in May of last year she states.  She said that shoulder been hurting recently.  On exam the shoulder is actually moving smoothly and fluidly on the right side and is well located.  Incision looks good and there is no deformities that I can see in the shoulder.  We did not x-ray today.  Her right hip is moving smoothly and fluidly with no blocks to rotation and there is no significant discomfort over the trochanteric area today.  At this point follow-up for hip can be as needed.  If she does have other issues that are not getting better with the right shoulder she needs to follow-up with Dr. Addie.

## 2024-03-01 ENCOUNTER — Encounter: Admitting: Rehabilitative and Restorative Service Providers"

## 2024-03-01 NOTE — Therapy (Incomplete)
 OUTPATIENT PHYSICAL THERAPY TREATMENT   Patient Name: [name] MRN: [id number] DOB:1956-06-01, 68 y.o., female Today's Date: 03/01/2024  END OF SESSION:      Past Medical History:  Diagnosis Date   Anemia 2006   required transfusion post TAH/BSO 05/2005   Arthritis    Asthma    Chronic cough    Colon polyps    hyperplastic   Diabetes mellitus without complication (HCC)    Diverticulosis    GERD (gastroesophageal reflux disease)    on prilosec   GLA deficiency (HCC)    Glaucoma of both eyes    Hypertension    Joint pain    Osteoarthritis    Pancreatitis    Shortness of breath dyspnea    Stroke [facility])    TIA 2022   Vitamin D  deficiency    Past Surgical History:  Procedure Laterality Date   BICEPT TENODESIS Right 11/19/2022   Procedure: BICEPS TENODESIS;  Surgeon: Addie Cordella Hamilton, MD;  Location: [facility] OR;  Service: Orthopedics;  Laterality: Right;   BREAST EXCISIONAL BIOPSY Right 1990   COLONOSCOPY N/A 08/22/2013   Procedure: COLONOSCOPY;  Surgeon: Norleen LOISE Kiang, MD;  Location: [facility] ENDOSCOPY;  Service: Endoscopy;  Laterality: N/A;   ESOPHAGOGASTRODUODENOSCOPY N/A 08/22/2013   Procedure: ESOPHAGOGASTRODUODENOSCOPY (EGD);  Surgeon: Norleen LOISE Kiang, MD;  Location: [facility] ENDOSCOPY;  Service: Endoscopy;  Laterality: N/A;   ESOPHAGOGASTRODUODENOSCOPY (EGD) WITH PROPOFOL  N/A 08/19/2015   Procedure: ESOPHAGOGASTRODUODENOSCOPY (EGD) WITH PROPOFOL ;  Surgeon: Norleen LOISE Kiang, MD;  Location: [facility] ENDOSCOPY;  Service: Endoscopy;  Laterality: N/A;   HYSTEROSCOPY WITH D & C  01/2005   for uterine fibroids.    INSERTION OF MESH N/A 08/24/2013   Procedure: INSERTION OF MESH;  Surgeon: Morene ONEIDA Olives, MD;  Location: [facility] OR;  Service: General;  Laterality: N/A;   LIPOMA EXCISION Left 06/21/2015   Procedure: EXCISION OF LEFT SCALP LIPOMA;  Surgeon: Donnice Lunger, MD;  Location: [facility];  Service: General;  Laterality: Left;   PANNICULECTOMY N/A 08/24/2013   Procedure:  PANNICULECTOMY;  Surgeon: Morene ONEIDA Olives, MD;  Location: [facility] OR;  Service: General;  Laterality: N/A;   REVERSE SHOULDER ARTHROPLASTY Right 11/19/2022   Procedure: REVERSE SHOULDER ARTHROPLASTY, LIPOMA REMOVAL;  Surgeon: Addie Cordella Hamilton, MD;  Location: [facility] OR;  Service: Orthopedics;  Laterality: Right;   TOTAL ABDOMINAL HYSTERECTOMY W/ BILATERAL SALPINGOOPHORECTOMY  05/2005   TOTAL HIP ARTHROPLASTY Right 12/12/2013   Procedure: RIGHT TOTAL HIP ARTHROPLASTY ANTERIOR APPROACH;  Surgeon: Lonni CINDERELLA Poli, MD;  Location: [facility] OR;  Service: Orthopedics;  Laterality: Right;   VENTRAL HERNIA REPAIR N/A 08/24/2013   Procedure: HERNIA REPAIR VENTRAL ADULT;  Surgeon: Morene ONEIDA Olives, MD;  Location: [facility] OR;  Service: General;  Laterality: N/A;   Patient Active Problem List   Diagnosis Date Noted   Arthritis of right shoulder region 11/22/2022   Biceps tendonitis on right 11/22/2022   Lipoma of right shoulder 11/22/2022   S/P reverse total shoulder arthroplasty, right 11/19/2022   Poorly controlled persistent asthma 02/27/2022   Pain in right shoulder 02/09/2022   Asthma exacerbation 09/19/2020   Anemia 10/13/2019   Glaucoma 10/13/2019   Hypertensive disorder 10/13/2019   Uterine leiomyoma 10/13/2019   Vitamin D  insufficiency 04/26/2019   TIA (transient ischemic attack) 03/30/2019   Hyperlipidemia 03/30/2019   Thyroid  nodule 03/30/2019   Stroke-like episode s/p IV tPA 03/28/2019   GERD (gastroesophageal reflux disease) 06/19/2018   Constipation 06/19/2018   Other fatigue 08/02/2017   Shortness of  breath on exertion 08/02/2017   Mass of axilla 07/07/2017   Abdominal pain, epigastric    Abnormal CT of the abdomen    Pancreatitis, acute    Acute pancreatitis 08/13/2015   Lower urinary tract infectious disease 08/13/2015   Severe obesity (BMI >= 40) (HCC) 01/23/2015   Sinusitis, chronic 01/08/2015   Cough variant asthma 01/03/2015   Arthritis of right hip 12/12/2013   Status  post THR (total hip replacement) 12/12/2013   Benign neoplasm of colon 08/22/2013   Special screening for malignant neoplasms, colon 08/22/2013   Abdominal pain 08/19/2013   Diabetes mellitus without complication (HCC) 08/19/2013   Essential hypertension, benign 08/19/2013   Ventral hernia 08/19/2013    PCP: Alm Rav, MD  REFERRING PROVIDER: Lonni CINDERELLA Poli, MD   REFERRING DIAG: [phone] (ICD-10-CM) - Pain in right hip  THERAPY DIAG:  No diagnosis found.  Rationale for Evaluation and Treatment: Rehabilitation  ONSET DATE: 6 months ago ; off and on for about a year   SUBJECTIVE:   SUBJECTIVE STATEMENT: Pt tates hip is  about the same.  Hard to get the HEP done.  PERTINENT HISTORY: Patient has undergone right THA in 2015 and is now experiencing right hip pain to include trochanteric bursitis. She has received a cortisone shot for this problem in the past which helped in the short term. MD sent her to PT to see if PT can resolve/assist pain prior to doing another injection.   PAIN:  NPRS scale: 4/10 Pain location: right hip  Pain description: throbbing Aggravating factors: nothing makes it worse, it is just constant Relieving factors: time, massaging, voltaren (used but doesn't work)  PRECAUTIONS: None  RED FLAGS: None   WEIGHT BEARING RESTRICTIONS: No  FALLS:  Has patient fallen in last 6 months? 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  DIAGNOSTIC FINDINGS: An AP pelvis and lateral of the right hip shows a total hip arthroplasty  with no complicating features.   PATIENT SURVEYS:  PSFS: THE PATIENT SPECIFIC FUNCTIONAL SCALE  Place score of 0-10 (0 = unable to perform activity and 10 = able to perform activity at the same level as before injury or problem)  Activity Date: 02/01/2024    Get on my knees   6    2. Walk without a limp   4    3.     4.      Total Score 5      Total Score = Sum of activity scores/number of activities  Minimally Detectable Change: 3 points (for single activity); 2 points (for average score)  COGNITION: 02/01/2024 Overall cognitive status: Within functional limits for tasks assessed     SENSATION: 02/01/2024 Light touch: WFL  EDEMA:  02/01/2024 Not tested on eval  MUSCLE LENGTH: 02/01/2024 Not tested on eval   POSTURE: 02/01/2024  rounded shoulders, forward head, and increased thoracic kyphosis  PALPATION: 02/01/2024 TTP at right greater trochanter   LOWER EXTREMITY ROM:  ROM Right Eval 02/01/2024  Left Eval 02/01/2024  Hip flexion    Hip extension    Hip abduction    Hip adduction    Hip internal rotation    Hip external rotation    Knee flexion    Knee extension    Ankle dorsiflexion    Ankle plantarflexion    Ankle inversion    Ankle eversion     (Blank rows = not tested)  LOWER EXTREMITY MMT:  MMT Right Eval 02/01/2024 Left Eval 02/01/2024 Right 02/15/2024  Hip flexion 3+/5 3+/5   Hip extension 3/5 3/5   Hip abduction 3+/5 3+/5 3+/5  Hip adduction     Hip internal rotation     Hip external rotation     Knee flexion 4+/5 5/5   Knee extension 5/5 5/5   Ankle dorsiflexion     Ankle plantarflexion     Ankle inversion     Ankle eversion      (Blank rows = not tested)  LOWER EXTREMITY SPECIAL TESTS:  02/01/2024 Hip special tests: [facility]) test: negative, Ober's test: negative, and FADIR: negative   FUNCTIONAL  TESTS:  02/01/2024 Did not assess on eval   GAIT: 02/01/2024 Distance walked: not formally assessed  Assistive device utilized: None Level of assistance: supervision Comments: antalgic gait pattern, Rt lateral lean with right stance, decreased stance time on Rt LE                  TREATMENT        DATE: 03/01/2024 Manual   TherEx    TherAct for functional activities - bending, standing, squatting    TREATMENT        DATE: 02/23/24  There Ex for ROM, strength Rec bike lev 2 8 min SAQ 2x10 bil  2# Sidelying clam shell Rt leg 2 x 10 There Act for functional activities - bending, standing, squatting Mini squats  Sit to stand 3x5 with 2# ball at 18  Neuro Re Ed for balance and posture Supine bridge 2-3 sec hold 2 x 10  Heel to Toe balance EO 30x2 each side  Manual Percussive device to Rt glute med/min/max                                                                                                                              TREATMENT        DATE: 02/15/2024 Manual Percussive device to Rt glute med/min/max  Therex: Sidelying clam shell Rt leg 2 x 10 Supine bridge 2-3 sec hold 2 x 10  Supine figure 4 pull towards 30 sec x 5 Rt  Supine SLR 2 x 5, performed bilaterally (noted difficulty using Rt leg).  Sit to stand to sit 18 inch chair x 10 for LE strength Nustep Lvl 5 10 mins UE/LE for endurance, ROM     PATIENT EDUCATION:  02/15/2024 Education details: HEP update Person educated: Patient Education method: Explanation, Demonstration, Tactile cues, Verbal cues, and Handouts Education  comprehension: verbalized understanding, returned demonstration, verbal cues required, and tactile cues required  HOME EXERCISE PROGRAM: Access Code: [email or web address] URL: [email or web address] Date: 02/15/2024 Prepared by: Ozell Silvan  Exercises - Supine Bridge  - 1 x daily - 7 x weekly - 2 sets - 10 reps - 2-3s hold - Supine Piriformis Stretch with Foot on Ground   - 1-2 x daily - 7 x weekly - 1 sets - 5 reps - 30 hold - Clamshell (Mirrored)  - 1-2 x daily - 7 x weekly - 2-3 sets - 10-15 reps - Standing Hip Abduction with Counter Support  - 1 x daily - 7 x weekly - 2 sets - 10 reps - Standing Hip Extension with Counter Support  - 1 x daily - 7 x weekly - 2 sets - 10 reps - Mini Squat with Counter Support  - 1 x daily - 7 x weekly - 2 sets - 10 reps - Supine Active Straight Leg Raise  - 1 x daily - 7 x weekly - 2 sets - 10 reps - 2-3s hold  ASSESSMENT:  CLINICAL IMPRESSION: Pt needed VC for squatting form.  Demonstrated understanding.  OBJECTIVE IMPAIRMENTS: Abnormal gait, decreased activity tolerance, decreased balance, decreased mobility, difficulty walking, decreased ROM, decreased strength, improper body mechanics, and postural dysfunction.   ACTIVITY LIMITATIONS: bending, standing, and squatting  PARTICIPATION LIMITATIONS: community activity and occupation  PERSONAL FACTORS: 3+ comorbidities: anemia, DM2, arthritis, asthma, glaucoma, GERD, prior TIA, HTN, kidney disease are also affecting patient's functional outcome.   REHAB POTENTIAL: Good  CLINICAL DECISION MAKING: Stable/uncomplicated  EVALUATION COMPLEXITY: Low   GOALS: Goals reviewed with patient? Yes  SHORT TERM GOALS: Target date: 02/22/2024 Patient will be compliant with initial HEP.  Baseline: Goal status: on going 02/15/2024  2.  Patient will report pain level no greater than 4/10 to show improved overall quality of life. Baseline:  Goal status: on going 02/15/2024   LONG TERM GOALS: Target date: 03/28/2024  Patient will be independent with final HEP to maintain and progress upon functional gains made within PT.  Baseline:  Goal status: INITIAL  2.  Patient will increase PSFS to at least 7 to show a significant improvement in subjective disability rating. Baseline:  Goal status: INITIAL  3.  Patient will report pain levels no greater than 2/10 in order to show  improved overall quality of life. Baseline:  Goal status: INITIAL  4.  Patient will increase bilat hip abduction MMT to at least 4+/5 in order to show improved biomechanics with functional mobility. Baseline:  Goal status: INITIAL  5.  Patient will increase bilat hip extension MMT to at least 4/5 in order to show improved biomechanics with functional mobility.  Baseline:  Goal status: INITIAL    PLAN:  PT FREQUENCY: 1x/week  PT DURATION: 8 weeks  PLANNED INTERVENTIONS: [id number]- PT Re-evaluation, 97750- Physical Performance Testing, 97110-Therapeutic exercises, 97530- Therapeutic activity, [id number]- Neuromuscular re-education, 97535- Self Care, [id number]- Manual therapy, [id number]- Gait training, [phone]- Electrical stimulation (unattended), [phone]- Electrical stimulation (manual), [id number]- Vasopneumatic device, [id number]- Ultrasound, [id number]- Traction (mechanical), [id number]- Ionotophoresis 4mg /ml Dexamethasone , [id number] (1-2 muscles), [id number] (3+ muscles)- Dry Needling, Patient/Family education, Balance training, Stair training, Taping, Joint mobilization, Joint manipulation, Spinal manipulation, Spinal mobilization, Scar mobilization, Cryotherapy, and Moist heat  PLAN FOR NEXT SESSION: Continue manual as needed for symptom relief.  Improve hip strength/control.    Ozell Silvan, PT, DPT, OCS, ATC 03/01/24  9:22 AM

## 2024-03-08 ENCOUNTER — Ambulatory Visit (INDEPENDENT_AMBULATORY_CARE_PROVIDER_SITE_OTHER)

## 2024-03-08 DIAGNOSIS — M25651 Stiffness of right hip, not elsewhere classified: Secondary | ICD-10-CM

## 2024-03-08 DIAGNOSIS — R262 Difficulty in walking, not elsewhere classified: Secondary | ICD-10-CM

## 2024-03-08 DIAGNOSIS — M6281 Muscle weakness (generalized): Secondary | ICD-10-CM | POA: Diagnosis not present

## 2024-03-08 DIAGNOSIS — R2689 Other abnormalities of gait and mobility: Secondary | ICD-10-CM

## 2024-03-08 DIAGNOSIS — M25551 Pain in right hip: Secondary | ICD-10-CM

## 2024-03-08 NOTE — Therapy (Signed)
 OUTPATIENT PHYSICAL THERAPY TREATMENT   Patient Name: [name] MRN: [id number] DOB:[birth year], 68 y.o., female Today's Date: 03/08/2024  END OF SESSION:  PT End of Session - 03/08/24 1549     Visit Number 4    Number of Visits 10    Date for PT Re-Evaluation 03/28/24    Authorization Type [employer] [id number]    Progress Note Due on Visit 10    PT Start Time 1510    PT Stop Time 1548    PT Time Calculation (min) 38 min    Activity Tolerance Patient tolerated treatment well    Behavior During Therapy [facility] for tasks assessed/performed             Past Medical History:  Diagnosis Date   Anemia 2006   required transfusion post TAH/BSO 05/2005   Arthritis    Asthma    Chronic cough    Colon polyps    hyperplastic   Diabetes mellitus without complication (HCC)    Diverticulosis    GERD (gastroesophageal reflux disease)    on prilosec   GLA deficiency (HCC)    Glaucoma of both eyes    Hypertension    Joint pain    Osteoarthritis    Pancreatitis    Shortness of breath dyspnea    Stroke [facility])    TIA 2022   Vitamin D  deficiency    Past Surgical History:  Procedure Laterality Date   BICEPT TENODESIS Right 11/19/2022   Procedure: BICEPS TENODESIS;  Surgeon: Addie Cordella Hamilton, MD;  Location: [facility] OR;  Service: Orthopedics;  Laterality: Right;   BREAST EXCISIONAL BIOPSY Right 1990   COLONOSCOPY N/A 08/22/2013   Procedure: COLONOSCOPY;  Surgeon: Norleen LOISE Kiang, MD;  Location[facility] ENDOSCOPY;  Service: Endoscopy;  Laterality: N/A;   ESOPHAGOGASTRODUODENOSCOPY N/A 08/22/2013   Procedure: ESOPHAGOGASTRODUODENOSCOPY (EGD);  Surgeon: Norleen LOISE Kiang, MD;  Location: [facility] ENDOSCOPY;  Service: Endoscopy;  Laterality: N/A;   ESOPHAGOGASTRODUODENOSCOPY (EGD) WITH PROPOFOL  N/A 08/19/2015   Procedure: ESOPHAGOGASTRODUODENOSCOPY (EGD) WITH PROPOFOL ;  Surgeon: Norleen LOISE Kiang, MD;  Location: [facility] ENDOSCOPY;  Service: Endoscopy;  Laterality: N/A;   HYSTEROSCOPY WITH D & C  01/2005   for uterine  fibroids.    INSERTION OF MESH N/A 08/24/2013   Procedure: INSERTION OF MESH;  Surgeon: Morene ONEIDA Olives, MD;  Location: [facility] OR;  Service: General;  Laterality: N/A;   LIPOMA EXCISION Left 06/21/2015   Procedure: EXCISION OF LEFT SCALP LIPOMA;  Surgeon: Donnice Lunger, MD;  Location: [facility];  Service: General;  Laterality: Left;   PANNICULECTOMY N/A 08/24/2013   Procedure: PANNICULECTOMY;  Surgeon: Morene ONEIDA Olives, MD;  Location: [facility] OR;  Service: General;  Laterality: N/A;   REVERSE SHOULDER ARTHROPLASTY Right 11/19/2022   Procedure: REVERSE SHOULDER ARTHROPLASTY, LIPOMA REMOVAL;  Surgeon: Addie Cordella Hamilton, MD;  Location: [facility] OR;  Service: Orthopedics;  Laterality: Right;   TOTAL ABDOMINAL HYSTERECTOMY W/ BILATERAL SALPINGOOPHORECTOMY  05/2005   TOTAL HIP ARTHROPLASTY Right 12/12/2013   Procedure: RIGHT TOTAL HIP ARTHROPLASTY ANTERIOR APPROACH;  Surgeon: Lonni CINDERELLA Poli, MD;  Location: [facility] OR;  Service: Orthopedics;  Laterality: Right;   VENTRAL HERNIA REPAIR N/A 08/24/2013   Procedure: HERNIA REPAIR VENTRAL ADULT;  Surgeon: Morene ONEIDA Olives, MD;  Location: [facility] OR;  Service: General;  Laterality: N/A;   Patient Active Problem List   Diagnosis Date Noted   Arthritis of right shoulder region 11/22/2022   Biceps tendonitis on right 11/22/2022   Lipoma of right shoulder 11/22/2022  S/P reverse total shoulder arthroplasty, right 11/19/2022   Poorly controlled persistent asthma 02/27/2022   Pain in right shoulder 02/09/2022   Asthma exacerbation 09/19/2020   Anemia 10/13/2019   Glaucoma 10/13/2019   Hypertensive disorder 10/13/2019   Uterine leiomyoma 10/13/2019   Vitamin D  insufficiency 04/26/2019   TIA (transient ischemic attack) 03/30/2019   Hyperlipidemia 03/30/2019   Thyroid  nodule 03/30/2019   Stroke-like episode s/p IV tPA 03/28/2019   GERD (gastroesophageal reflux disease) 06/19/2018   Constipation 06/19/2018   Other fatigue 08/02/2017    Shortness of breath on exertion 08/02/2017   Mass of axilla 07/07/2017   Abdominal pain, epigastric    Abnormal CT of the abdomen    Pancreatitis, acute    Acute pancreatitis 08/13/2015   Lower urinary tract infectious disease 08/13/2015   Severe obesity (BMI >= 40) (HCC) 01/23/2015   Sinusitis, chronic 01/08/2015   Cough variant asthma 01/03/2015   Arthritis of right hip 12/12/2013   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   Benign neoplasm of colon 08/22/2013   Special screening for malignant neoplasms, colon 08/22/2013   Abdominal pain 08/19/2013   Diabetes mellitus without complication (HCC) 08/19/2013   Essential hypertension, benign 08/19/2013   Ventral hernia 08/19/2013    PCP: Alm Rav, MD  REFERRING PROVIDER: Lonni CINDERELLA Poli, MD   REFERRING DIAG: [phone] (ICD-10-CM) - Pain in right hip  THERAPY DIAG:  Muscle weakness (generalized)  Difficulty in walking, not elsewhere classified  Pain in right hip  Stiffness of right hip, not elsewhere classified  Other abnormalities of gait and mobility  Rationale for Evaluation and Treatment: Rehabilitation  ONSET DATE: 6 months ago ; off and on for about a year   SUBJECTIVE:   SUBJECTIVE STATEMENT: Pt reports the hip is not as bad as last visit. PERTINENT HISTORY: Patient has undergone right THA in 2015 and is now experiencing right hip pain to include trochanteric bursitis. She has received a cortisone shot for this problem in the past which helped in the short term. MD sent her to PT to see if PT can resolve/assist pain prior to doing another injection.   PAIN:  NPRS scale: 3/10 Pain location: right hip  Pain description: throbbing Aggravating factors: nothing makes it worse, it is just constant Relieving factors: time, massaging, voltaren (used but doesn't work)  PRECAUTIONS: None  RED FLAGS: None   WEIGHT BEARING RESTRICTIONS: No  FALLS:  Has patient fallen in last 6 months?  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  DIAGNOSTIC FINDINGS: An AP pelvis and lateral of the right hip shows a total hip arthroplasty  with no complicating features.   PATIENT SURVEYS:  PSFS: THE PATIENT SPECIFIC FUNCTIONAL SCALE  Place score of 0-10 (0 = unable to perform activity and 10 = able to perform activity at the same level as before injury or problem)  Activity Date: 02/01/2024    Get on my knees   6    2. Walk without a limp  4    3.     4.      Total Score 5      Total Score = Sum of activity scores/number of activities  Minimally Detectable Change: 3 points (for single activity); 2 points (for average score)  COGNITION: 02/01/2024 Overall cognitive status: Within functional limits for tasks assessed     SENSATION: 02/01/2024 Light touch: WFL  EDEMA:  02/01/2024 Not tested on eval  MUSCLE LENGTH: 02/01/2024 Not tested on eval   POSTURE: 02/01/2024  rounded shoulders, forward head, and increased thoracic kyphosis  PALPATION: 02/01/2024 TTP at right greater trochanter   LOWER EXTREMITY ROM:  ROM Right Eval 02/01/2024 Left Eval 02/01/2024  Hip flexion    Hip extension    Hip abduction    Hip adduction    Hip internal rotation    Hip external rotation    Knee flexion    Knee extension    Ankle dorsiflexion    Ankle plantarflexion    Ankle inversion    Ankle eversion     (Blank rows = not  tested)  LOWER EXTREMITY MMT:  MMT Right Eval 02/01/2024 Left Eval 02/01/2024 Right 02/15/2024  Hip flexion 3+/5 3+/5   Hip extension 3/5 3/5   Hip abduction 3+/5 3+/5 3+/5  Hip adduction     Hip internal rotation     Hip external rotation     Knee flexion 4+/5 5/5   Knee extension 5/5 5/5   Ankle dorsiflexion     Ankle plantarflexion     Ankle inversion     Ankle eversion      (Blank rows = not tested)  LOWER EXTREMITY SPECIAL TESTS:  02/01/2024 Hip special tests: Belvie (FABER) test: negative, Ober's test: negative, and FADIR: negative   FUNCTIONAL TESTS:  02/01/2024 Did not assess on eval   GAIT: 02/01/2024 Distance walked: not formally assessed  Assistive device utilized: None Level of assistance: supervision Comments: antalgic gait pattern, Rt lateral lean with right stance, decreased stance time on Rt LE                  TREATMENT        DATE: 03/08/24   [id number]  Neuro Re Ed for balance and posture Supine bridge 2-3 sec hold 2 x 10  Heel to Toe balance EO 30x2 each side EC 2x each side  TherEx  Rec bike lev 2 8 min SAQ 2x10 bil  2.5 # Sidelying clam shell Rt leg 2 x 10  TherAct for functional activities - bending, standing, squatting Mini squats 2x10 Sit to stand 2x10 with 2# ball at 18    TREATMENT        DATE: 02/23/24  There Ex for ROM, strength Rec bike lev 2 8 min SAQ 2x10 bil  2# Sidelying clam shell Rt leg 2 x 10 There Act for functional activities - bending, standing, squatting Mini squats  Sit to stand 3x5 with 2# ball at 18  Neuro Re Ed for balance and posture Supine bridge 2-3 sec hold 2 x 10  Heel to Toe balance EO 30x2 each side  Manual Percussive device to Rt glute med/min/max  TREATMENT        DATE: 02/15/2024 Manual Percussive device to Rt glute med/min/max  Therex: Sidelying clam shell Rt leg 2 x  10 Supine bridge 2-3 sec hold 2 x 10  Supine figure 4 pull towards 30 sec x 5 Rt  Supine SLR 2 x 5, performed bilaterally (noted difficulty using Rt leg).  Sit to stand to sit 18 inch chair x 10 for LE strength Nustep Lvl 5 10 mins UE/LE for endurance, ROM     PATIENT EDUCATION:  02/15/2024 Education details: HEP update Person educated: Patient Education method: Explanation, Demonstration, Tactile cues, Verbal cues, and Handouts Education comprehension: verbalized understanding, returned demonstration, verbal cues required, and tactile cues required  HOME EXERCISE PROGRAM: Access Code: [email or web address] URL: [email or web address] Date: 02/15/2024 Prepared by: Ozell Silvan  Exercises - Supine Bridge  - 1 x daily - 7 x weekly - 2 sets - 10 reps - 2-3s hold - Supine Piriformis Stretch with Foot on Ground  - 1-2 x daily - 7 x weekly - 1 sets - 5 reps - 30 hold - Clamshell (Mirrored)  - 1-2 x daily - 7 x weekly - 2-3 sets - 10-15 reps - Standing Hip Abduction with Counter Support  - 1 x daily - 7 x weekly - 2 sets - 10 reps - Standing Hip Extension with Counter Support  - 1 x daily - 7 x weekly - 2 sets - 10 reps - Mini Squat with Counter Support  - 1 x daily - 7 x weekly - 2 sets - 10 reps - Supine Active Straight Leg Raise  - 1 x daily - 7 x weekly - 2 sets - 10 reps - 2-3s hold  ASSESSMENT:  CLINICAL IMPRESSION: Pt challenged by EC with tandem stance-needed tactile cuing and use of bars with swaying.  STG completed.  OBJECTIVE IMPAIRMENTS: Abnormal gait, decreased activity tolerance, decreased balance, decreased mobility, difficulty walking, decreased ROM, decreased strength, improper body mechanics, and postural dysfunction.   ACTIVITY LIMITATIONS: bending, standing, and squatting  PARTICIPATION LIMITATIONS: community activity and occupation  PERSONAL FACTORS: 3+ comorbidities: anemia, DM2, arthritis, asthma, glaucoma, GERD, prior TIA, HTN, kidney disease are also  affecting patient's functional outcome.   REHAB POTENTIAL: Good  CLINICAL DECISION MAKING: Stable/uncomplicated  EVALUATION COMPLEXITY: Low   GOALS: Goals reviewed with patient? Yes  SHORT TERM GOALS: Target date: 02/22/2024 Patient will be compliant with initial HEP.  Baseline: Goal status: MET 03/08/24  2.  Patient will report pain level no greater than 4/10 to show improved overall quality of life. Baseline:  Goal status: MET 03/08/24   LONG TERM GOALS: Target date: 03/28/2024  Patient will be independent with final HEP to maintain and progress upon functional gains made within PT.  Baseline:  Goal status: INITIAL  2.  Patient will increase PSFS to at least 7 to show a significant improvement in subjective disability rating. Baseline:  Goal status: INITIAL  3.  Patient will report pain levels no greater than 2/10 in order to show improved overall quality of life. Baseline:  Goal status: INITIAL  4.  Patient will increase bilat hip abduction MMT to at least 4+/5 in order to show improved biomechanics with functional mobility. Baseline:  Goal status: INITIAL  5.  Patient will increase bilat hip extension MMT to at least 4/5 in order to show improved biomechanics with functional mobility.  Baseline:  Goal status: INITIAL    PLAN:  PT FREQUENCY: 1x/week  PT DURATION: 8 weeks  PLANNED INTERVENTIONS:  02835- PT Re-evaluation, 97750- Physical Performance Testing, 97110-Therapeutic exercises, 97530- Therapeutic activity, [id number]- Neuromuscular re-education, 97535- Self Care, [id number]- Manual therapy, [phone]- Gait training, [phone]- Electrical stimulation (unattended), [phone]- Electrical stimulation (manual), [id number]- Vasopneumatic device, [id number]- Ultrasound, [id number]- Traction (mechanical), [id number]- Ionotophoresis 4mg /ml Dexamethasone , 79439 (1-2 muscles), [id number] (3+ muscles)- Dry Needling, Patient/Family education, Balance training, Stair training, Taping, Joint mobilization, Joint  manipulation, Spinal manipulation, Spinal mobilization, Scar mobilization, Cryotherapy, and Moist heat  PLAN FOR NEXT SESSION: Continue manual as needed for symptom relief.  Improve hip strength/control.   Burnard Meth, PT 03/08/24  3:50 PM

## 2024-03-14 NOTE — Therapy (Incomplete)
 OUTPATIENT PHYSICAL THERAPY TREATMENT   Patient Name: [name] MRN: [id number] DOB:1956-04-02, 68 y.o., female Today's Date: 03/14/2024  END OF SESSION:***       Past Medical History:  Diagnosis Date   Anemia 2006   required transfusion post TAH/BSO 05/2005   Arthritis    Asthma    Chronic cough    Colon polyps    hyperplastic   Diabetes mellitus without complication (HCC)    Diverticulosis    GERD (gastroesophageal reflux disease)    on prilosec   GLA deficiency (HCC)    Glaucoma of both eyes    Hypertension    Joint pain    Osteoarthritis    Pancreatitis    Shortness of breath dyspnea    Stroke [facility])    TIA 2022   Vitamin D  deficiency    Past Surgical History:  Procedure Laterality Date   BICEPT TENODESIS Right 11/19/2022   Procedure: BICEPS TENODESIS;  Surgeon: Addie Cordella Hamilton, MD;  Location: [facility] OR;  Service: Orthopedics;  Laterality: Right;   BREAST EXCISIONAL BIOPSY Right 1990   COLONOSCOPY N/A 08/22/2013   Procedure: COLONOSCOPY;  Surgeon: Norleen LOISE Kiang, MD;  Location: [facility] ENDOSCOPY;  Service: Endoscopy;  Laterality: N/A;   ESOPHAGOGASTRODUODENOSCOPY N/A 08/22/2013   Procedure: ESOPHAGOGASTRODUODENOSCOPY (EGD);  Surgeon: Norleen LOISE Kiang, MD;  Location: [facility] ENDOSCOPY;  Service: Endoscopy;  Laterality: N/A;   ESOPHAGOGASTRODUODENOSCOPY (EGD) WITH PROPOFOL  N/A 08/19/2015   Procedure: ESOPHAGOGASTRODUODENOSCOPY (EGD) WITH PROPOFOL ;  Surgeon: Norleen LOISE Kiang, MD;  Location: [facility] ENDOSCOPY;  Service: Endoscopy;  Laterality: N/A;   HYSTEROSCOPY WITH D & C  01/2005   for uterine fibroids.    INSERTION OF MESH N/A 08/24/2013   Procedure: INSERTION OF MESH;  Surgeon: Morene ONEIDA Olives, MD;  Location: [facility] OR;  Service: General;  Laterality: N/A;   LIPOMA EXCISION Left 06/21/2015   Procedure: EXCISION OF LEFT SCALP LIPOMA;  Surgeon: Donnice Lunger, MD;  Location: [facility];  Service: General;  Laterality: Left;   PANNICULECTOMY N/A 08/24/2013    Procedure: PANNICULECTOMY;  Surgeon: Morene ONEIDA Olives, MD;  Location: [facility] OR;  Service: General;  Laterality: N/A;   REVERSE SHOULDER ARTHROPLASTY Right 11/19/2022   Procedure: REVERSE SHOULDER ARTHROPLASTY, LIPOMA REMOVAL;  Surgeon: Addie Cordella Hamilton, MD;  Location: [facility] OR;  Service: Orthopedics;  Laterality: Right;   TOTAL ABDOMINAL HYSTERECTOMY W/ BILATERAL SALPINGOOPHORECTOMY  05/2005   TOTAL HIP ARTHROPLASTY Right 12/12/2013   Procedure: RIGHT TOTAL HIP ARTHROPLASTY ANTERIOR APPROACH;  Surgeon: Lonni CINDERELLA Poli, MD;  Location: [facility] OR;  Service: Orthopedics;  Laterality: Right;   VENTRAL HERNIA REPAIR N/A 08/24/2013   Procedure: HERNIA REPAIR VENTRAL ADULT;  Surgeon: Morene ONEIDA Olives, MD;  Location: [facility] OR;  Service: General;  Laterality: N/A;   Patient Active Problem List   Diagnosis Date Noted   Arthritis of right shoulder region 11/22/2022   Biceps tendonitis on right 11/22/2022   Lipoma of right shoulder 11/22/2022   S/P reverse total shoulder arthroplasty, right 11/19/2022   Poorly controlled persistent asthma 02/27/2022   Pain in right shoulder 02/09/2022   Asthma exacerbation 09/19/2020   Anemia 10/13/2019   Glaucoma 10/13/2019   Hypertensive disorder 10/13/2019   Uterine leiomyoma 10/13/2019   Vitamin D  insufficiency 04/26/2019   TIA (transient ischemic attack) 03/30/2019   Hyperlipidemia 03/30/2019   Thyroid  nodule 03/30/2019   Stroke-like episode s/p IV tPA 03/28/2019   GERD (gastroesophageal reflux disease) 06/19/2018   Constipation 06/19/2018   Other fatigue 08/02/2017   Shortness  of breath on exertion 08/02/2017   Mass of axilla 07/07/2017   Abdominal pain, epigastric    Abnormal CT of the abdomen    Pancreatitis, acute    Acute pancreatitis 08/13/2015   Lower urinary tract infectious disease 08/13/2015   Severe obesity (BMI >= 40) (HCC) 01/23/2015   Sinusitis, chronic 01/08/2015   Cough variant asthma 01/03/2015   Arthritis of right hip 12/12/2013    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   Benign neoplasm of colon 08/22/2013   Special screening for malignant neoplasms, colon 08/22/2013   Abdominal pain 08/19/2013   Diabetes mellitus without complication (HCC) 08/19/2013   Essential hypertension, benign 08/19/2013   Ventral hernia 08/19/2013    PCP: Alm Rav, MD  REFERRING PROVIDER: Lonni CINDERELLA Poli, MD   REFERRING DIAG: [phone] (ICD-10-CM) - Pain in right hip  THERAPY DIAG:  No diagnosis found.  Rationale for Evaluation and Treatment: Rehabilitation  ONSET DATE: 6 months ago ; off and on for about a year   SUBJECTIVE:   SUBJECTIVE STATEMENT: ***Pt reports the hip is not as bad as last visit. PERTINENT HISTORY: Patient has undergone right THA in 2015 and is now experiencing right hip pain to include trochanteric bursitis. She has received a cortisone shot for this problem in the past which helped in the short term. MD sent her to PT to see if PT can resolve/assist pain prior to doing another injection.   PAIN:  ***NPRS scale: 3/10 Pain location: right hip  Pain description: throbbing Aggravating factors: nothing makes it worse, it is just constant Relieving factors: time, massaging, voltaren (used but doesn't work)  PRECAUTIONS: None  RED FLAGS: None   WEIGHT BEARING RESTRICTIONS: No  FALLS:  Has patient fallen in last 6 months? 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  DIAGNOSTIC FINDINGS: An AP pelvis and lateral of the right hip shows a total hip arthroplasty  with no complicating features.   PATIENT SURVEYS:  PSFS: THE PATIENT SPECIFIC FUNCTIONAL SCALE  Place score of 0-10 (0 = unable to perform activity and 10 = able to perform activity at the same level as before injury or problem)  Activity Date: 02/01/2024    Get on my knees   6    2. Walk without a limp   4    3.     4.      Total Score 5      Total Score = Sum of activity scores/number of activities  Minimally Detectable Change: 3 points (for single activity); 2 points (for average score)  COGNITION: 02/01/2024 Overall cognitive status: Within functional limits for tasks assessed     SENSATION: 02/01/2024 Light touch: WFL  EDEMA:  02/01/2024 Not tested on eval  MUSCLE LENGTH: 02/01/2024 Not tested on eval   POSTURE: 02/01/2024  rounded shoulders, forward head, and increased thoracic kyphosis  PALPATION: 02/01/2024 TTP at right greater trochanter   LOWER EXTREMITY ROM:  ROM Right Eval 02/01/2024 Left Eval 02/01/2024  Hip flexion    Hip extension    Hip abduction    Hip adduction    Hip internal rotation    Hip external rotation    Knee flexion    Knee extension    Ankle dorsiflexion    Ankle plantarflexion    Ankle inversion    Ankle eversion     (Blank rows = not tested)  LOWER EXTREMITY MMT:  MMT Right Eval 02/01/2024 Left Eval 02/01/2024 Right 02/15/2024  Hip flexion 3+/5 3+/5   Hip extension 3/5 3/5   Hip abduction 3+/5 3+/5 3+/5  Hip adduction     Hip internal rotation     Hip external rotation     Knee flexion 4+/5 5/5   Knee extension 5/5 5/5   Ankle dorsiflexion     Ankle plantarflexion     Ankle inversion     Ankle eversion      (Blank rows = not tested)  LOWER EXTREMITY SPECIAL TESTS:  02/01/2024 Hip special tests: [facility]) test: negative, Ober's test: negative, and FADIR: negative   FUNCTIONAL TESTS:   02/01/2024 Did not assess on eval   GAIT: 02/01/2024 Distance walked: not formally assessed  Assistive device utilized: None Level of assistance: supervision Comments: antalgic gait pattern, Rt lateral lean with right stance, decreased stance time on Rt LE                  TREATMENT         Date-03/15/24***        DATE: 03/08/24   [id number]  Neuro Re Ed for balance and posture Supine bridge 2-3 sec hold 2 x 10  Heel to Toe balance EO 30x2 each side EC 2x each side  TherEx  Rec bike lev 2 8 min SAQ 2x10 bil  2.5 # Sidelying clam shell Rt leg 2 x 10  TherAct for functional activities - bending, standing, squatting Mini squats 2x10 Sit to stand 2x10 with 2# ball at 18    TREATMENT        DATE: 02/23/24  There Ex for ROM, strength Rec bike lev 2 8 min SAQ 2x10 bil  2# Sidelying clam shell Rt leg 2 x 10 There Act for functional activities - bending, standing, squatting Mini squats  Sit to stand 3x5 with 2# ball at 18  Neuro Re Ed for balance and posture Supine bridge 2-3 sec hold 2 x 10  Heel to Toe balance EO 30x2 each side  Manual Percussive device to Rt glute med/min/max                                                                                                                              TREATMENT        DATE: 02/15/2024 Manual Percussive device to Rt glute med/min/max  Therex: Sidelying clam shell Rt leg 2 x 10 Supine bridge 2-3 sec hold 2 x 10  Supine figure 4 pull towards 30  sec x 5 Rt  Supine SLR 2 x 5, performed bilaterally (noted difficulty using Rt leg).  Sit to stand to sit 18 inch chair x 10 for LE strength Nustep Lvl 5 10 mins UE/LE for endurance, ROM     PATIENT EDUCATION:  02/15/2024 Education details: HEP update Person educated: Patient Education method: Explanation, Demonstration, Tactile cues, Verbal cues, and Handouts Education comprehension: verbalized understanding, returned demonstration, verbal cues required, and tactile  cues required  HOME EXERCISE PROGRAM: Access Code: [email or web address] URL: [email or web address] Date: 02/15/2024 Prepared by: Ozell Silvan  Exercises - Supine Bridge  - 1 x daily - 7 x weekly - 2 sets - 10 reps - 2-3s hold - Supine Piriformis Stretch with Foot on Ground  - 1-2 x daily - 7 x weekly - 1 sets - 5 reps - 30 hold - Clamshell (Mirrored)  - 1-2 x daily - 7 x weekly - 2-3 sets - 10-15 reps - Standing Hip Abduction with Counter Support  - 1 x daily - 7 x weekly - 2 sets - 10 reps - Standing Hip Extension with Counter Support  - 1 x daily - 7 x weekly - 2 sets - 10 reps - Mini Squat with Counter Support  - 1 x daily - 7 x weekly - 2 sets - 10 reps - Supine Active Straight Leg Raise  - 1 x daily - 7 x weekly - 2 sets - 10 reps - 2-3s hold  ASSESSMENT:  CLINICAL IMPRESSION: ***Pt challenged by EC with tandem stance-needed tactile cuing and use of bars with swaying.  STG completed.  OBJECTIVE IMPAIRMENTS: Abnormal gait, decreased activity tolerance, decreased balance, decreased mobility, difficulty walking, decreased ROM, decreased strength, improper body mechanics, and postural dysfunction.   ACTIVITY LIMITATIONS: bending, standing, and squatting  PARTICIPATION LIMITATIONS: community activity and occupation  PERSONAL FACTORS: 3+ comorbidities: anemia, DM2, arthritis, asthma, glaucoma, GERD, prior TIA, HTN, kidney disease are also affecting patient's functional outcome.   REHAB POTENTIAL: Good  CLINICAL DECISION MAKING: Stable/uncomplicated  EVALUATION COMPLEXITY: Low   GOALS: Goals reviewed with patient? Yes  SHORT TERM GOALS: Target date: 02/22/2024 Patient will be compliant with initial HEP.  Baseline: Goal status: MET 03/08/24  2.  Patient will report pain level no greater than 4/10 to show improved overall quality of life. Baseline:  Goal status: MET 03/08/24   LONG TERM GOALS: Target date: 03/28/2024  Patient will be independent with final HEP to  maintain and progress upon functional gains made within PT.  Baseline:  Goal status: INITIAL  2.  Patient will increase PSFS to at least 7 to show a significant improvement in subjective disability rating. Baseline:  Goal status: INITIAL  3.  Patient will report pain levels no greater than 2/10 in order to show improved overall quality of life. Baseline:  Goal status: INITIAL  4.  Patient will increase bilat hip abduction MMT to at least 4+/5 in order to show improved biomechanics with functional mobility. Baseline:  Goal status: INITIAL  5.  Patient will increase bilat hip extension MMT to at least 4/5 in order to show improved biomechanics with functional mobility.  Baseline:  Goal status: INITIAL    PLAN:  PT FREQUENCY: 1x/week  PT DURATION: 8 weeks  PLANNED INTERVENTIONS: [id number]- PT Re-evaluation, 97750- Physical Performance Testing, 97110-Therapeutic exercises, 97530- Therapeutic activity, [id number]- Neuromuscular re-education, 97535- Self Care, [id number]- Manual therapy, [id number]- Gait training, [phone]- Electrical stimulation (unattended), [id number]- Electrical stimulation (manual), [id number]- Vasopneumatic device, [id number]- Ultrasound, [id number]- Traction (mechanical), [id number]- Ionotophoresis  4mg /ml Dexamethasone , 20560 (1-2 muscles), 20561 (3+ muscles)- Dry Needling, Patient/Family education, Balance training, Stair training, Taping, Joint mobilization, Joint manipulation, Spinal manipulation, Spinal mobilization, Scar mobilization, Cryotherapy, and Moist heat  PLAN FOR NEXT SESSION: ***Continue manual as needed for symptom relief.  Improve hip strength/control.   Burnard Meth, PT 03/14/24  1:34 PM

## 2024-03-15 ENCOUNTER — Encounter

## 2024-03-15 ENCOUNTER — Telehealth: Payer: Self-pay

## 2024-03-15 NOTE — Telephone Encounter (Signed)
 Spoke with patient about missed appointment on 03/15/24.  She states she forgot.  Reminded her of next appointment on 03/22/24.

## 2024-03-21 NOTE — Therapy (Incomplete)
 OUTPATIENT PHYSICAL THERAPY TREATMENT   Patient Name: [name] MRN: [id number] DOB:Mar 17, 1956, 68 y.o., female Today's Date: 03/21/2024  END OF SESSION:***       Past Medical History:  Diagnosis Date   Anemia 2006   required transfusion post TAH/BSO 05/2005   Arthritis    Asthma    Chronic cough    Colon polyps    hyperplastic   Diabetes mellitus without complication (HCC)    Diverticulosis    GERD (gastroesophageal reflux disease)    on prilosec   GLA deficiency (HCC)    Glaucoma of both eyes    Hypertension    Joint pain    Osteoarthritis    Pancreatitis    Shortness of breath dyspnea    Stroke [facility])    TIA 2022   Vitamin D  deficiency    Past Surgical History:  Procedure Laterality Date   BICEPT TENODESIS Right 11/19/2022   Procedure: BICEPS TENODESIS;  Surgeon: Addie Cordella Hamilton, MD;  Location: [facility] OR;  Service: Orthopedics;  Laterality: Right;   BREAST EXCISIONAL BIOPSY Right 1990   COLONOSCOPY N/A 08/22/2013   Procedure: COLONOSCOPY;  Surgeon: Norleen LOISE Kiang, MD;  Location: [facility] ENDOSCOPY;  Service: Endoscopy;  Laterality: N/A;   ESOPHAGOGASTRODUODENOSCOPY N/A 08/22/2013   Procedure: ESOPHAGOGASTRODUODENOSCOPY (EGD);  Surgeon: Norleen LOISE Kiang, MD;  Location: [facility] ENDOSCOPY;  Service: Endoscopy;  Laterality: N/A;   ESOPHAGOGASTRODUODENOSCOPY (EGD) WITH PROPOFOL  N/A 08/19/2015   Procedure: ESOPHAGOGASTRODUODENOSCOPY (EGD) WITH PROPOFOL ;  Surgeon: Norleen LOISE Kiang, MD;  Location: [facility] ENDOSCOPY;  Service: Endoscopy;  Laterality: N/A;   HYSTEROSCOPY WITH D & C  01/2005   for uterine fibroids.    INSERTION OF MESH N/A 08/24/2013   Procedure: INSERTION OF MESH;  Surgeon: Morene ONEIDA Olives, MD;  Location: [facility] OR;  Service: General;  Laterality: N/A;   LIPOMA EXCISION Left 06/21/2015   Procedure: EXCISION OF LEFT SCALP LIPOMA;  Surgeon: Donnice Lunger, MD;  Location: [facility];  Service: General;  Laterality: Left;   PANNICULECTOMY N/A 08/24/2013    Procedure: PANNICULECTOMY;  Surgeon: Morene ONEIDA Olives, MD;  Location: [facility] OR;  Service: General;  Laterality: N/A;   REVERSE SHOULDER ARTHROPLASTY Right 11/19/2022   Procedure: REVERSE SHOULDER ARTHROPLASTY, LIPOMA REMOVAL;  Surgeon: Addie Cordella Hamilton, MD;  Location: [facility] OR;  Service: Orthopedics;  Laterality: Right;   TOTAL ABDOMINAL HYSTERECTOMY W/ BILATERAL SALPINGOOPHORECTOMY  05/2005   TOTAL HIP ARTHROPLASTY Right 12/12/2013   Procedure: RIGHT TOTAL HIP ARTHROPLASTY ANTERIOR APPROACH;  Surgeon: Lonni CINDERELLA Poli, MD;  Location: [facility] OR;  Service: Orthopedics;  Laterality: Right;   VENTRAL HERNIA REPAIR N/A 08/24/2013   Procedure: HERNIA REPAIR VENTRAL ADULT;  Surgeon: Morene ONEIDA Olives, MD;  Location: [facility] OR;  Service: General;  Laterality: N/A;   Patient Active Problem List   Diagnosis Date Noted   Arthritis of right shoulder region 11/22/2022   Biceps tendonitis on right 11/22/2022   Lipoma of right shoulder 11/22/2022   S/P reverse total shoulder arthroplasty, right 11/19/2022   Poorly controlled persistent asthma 02/27/2022   Pain in right shoulder 02/09/2022   Asthma exacerbation 09/19/2020   Anemia 10/13/2019   Glaucoma 10/13/2019   Hypertensive disorder 10/13/2019   Uterine leiomyoma 10/13/2019   Vitamin D  insufficiency 04/26/2019   TIA (transient ischemic attack) 03/30/2019   Hyperlipidemia 03/30/2019   Thyroid  nodule 03/30/2019   Stroke-like episode s/p IV tPA 03/28/2019   GERD (gastroesophageal reflux disease) 06/19/2018   Constipation 06/19/2018   Other fatigue 08/02/2017   Shortness  of breath on exertion 08/02/2017   Mass of axilla 07/07/2017   Abdominal pain, epigastric    Abnormal CT of the abdomen    Pancreatitis, acute    Acute pancreatitis 08/13/2015   Lower urinary tract infectious disease 08/13/2015   Severe obesity (BMI >= 40) (HCC) 01/23/2015   Sinusitis, chronic 01/08/2015   Cough variant asthma 01/03/2015   Arthritis of right hip 12/12/2013    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   Benign neoplasm of colon 08/22/2013   Special screening for malignant neoplasms, colon 08/22/2013   Abdominal pain 08/19/2013   Diabetes mellitus without complication (HCC) 08/19/2013   Essential hypertension, benign 08/19/2013   Ventral hernia 08/19/2013    PCP: Alm Rav, MD  REFERRING PROVIDER: Lonni CINDERELLA Poli, MD   REFERRING DIAG: [phone] (ICD-10-CM) - Pain in right hip  THERAPY DIAG:  No diagnosis found.  Rationale for Evaluation and Treatment: Rehabilitation  ONSET DATE: 6 months ago ; off and on for about a year   SUBJECTIVE:   SUBJECTIVE STATEMENT: ***Pt reports the hip is not as bad as last visit. PERTINENT HISTORY: Patient has undergone right THA in 2015 and is now experiencing right hip pain to include trochanteric bursitis. She has received a cortisone shot for this problem in the past which helped in the short term. MD sent her to PT to see if PT can resolve/assist pain prior to doing another injection.   PAIN:  ***NPRS scale: 3/10 Pain location: right hip  Pain description: throbbing Aggravating factors: nothing makes it worse, it is just constant Relieving factors: time, massaging, voltaren (used but doesn't work)  PRECAUTIONS: None  RED FLAGS: None   WEIGHT BEARING RESTRICTIONS: No  FALLS:  Has patient fallen in last 6 months? 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  DIAGNOSTIC FINDINGS: An AP pelvis and lateral of the right hip shows a total hip arthroplasty  with no complicating features.   PATIENT SURVEYS:  PSFS: THE PATIENT SPECIFIC FUNCTIONAL SCALE  Place score of 0-10 (0 = unable to perform activity and 10 = able to perform activity at the same level as before injury or problem)  Activity Date: 02/01/2024    Get on my knees   6    2. Walk without a limp   4    3.     4.      Total Score 5      Total Score = Sum of activity scores/number of activities  Minimally Detectable Change: 3 points (for single activity); 2 points (for average score)  COGNITION: 02/01/2024 Overall cognitive status: Within functional limits for tasks assessed     SENSATION: 02/01/2024 Light touch: WFL  EDEMA:  02/01/2024 Not tested on eval  MUSCLE LENGTH: 02/01/2024 Not tested on eval   POSTURE: 02/01/2024  rounded shoulders, forward head, and increased thoracic kyphosis  PALPATION: 02/01/2024 TTP at right greater trochanter   LOWER EXTREMITY ROM:  ROM Right Eval 02/01/2024 Left Eval 02/01/2024  Hip flexion    Hip extension    Hip abduction    Hip adduction    Hip internal rotation    Hip external rotation    Knee flexion    Knee extension    Ankle dorsiflexion    Ankle plantarflexion    Ankle inversion    Ankle eversion     (Blank rows = not tested)  LOWER EXTREMITY MMT:  MMT Right Eval 02/01/2024 Left Eval 02/01/2024 Right 02/15/2024  Hip flexion 3+/5 3+/5   Hip extension 3/5 3/5   Hip abduction 3+/5 3+/5 3+/5  Hip adduction     Hip internal rotation     Hip external rotation     Knee flexion 4+/5 5/5   Knee extension 5/5 5/5   Ankle dorsiflexion     Ankle plantarflexion     Ankle inversion     Ankle eversion      (Blank rows = not tested)  LOWER EXTREMITY SPECIAL TESTS:  02/01/2024 Hip special tests: [facility]) test: negative, Ober's test: negative, and FADIR: negative   FUNCTIONAL TESTS:   02/01/2024 Did not assess on eval   GAIT: 02/01/2024 Distance walked: not formally assessed  Assistive device utilized: None Level of assistance: supervision Comments: antalgic gait pattern, Rt lateral lean with right stance, decreased stance time on Rt LE                  TREATMENT         Date-03/15/24***        DATE: 03/08/24   [id number]  Neuro Re Ed for balance and posture Supine bridge 2-3 sec hold 2 x 10  Heel to Toe balance EO 30x2 each side EC 2x each side  TherEx  Rec bike lev 2 8 min SAQ 2x10 bil  2.5 # Sidelying clam shell Rt leg 2 x 10  TherAct for functional activities - bending, standing, squatting Mini squats 2x10 Sit to stand 2x10 with 2# ball at 18    TREATMENT        DATE: 02/23/24  There Ex for ROM, strength Rec bike lev 2 8 min SAQ 2x10 bil  2# Sidelying clam shell Rt leg 2 x 10 There Act for functional activities - bending, standing, squatting Mini squats  Sit to stand 3x5 with 2# ball at 18  Neuro Re Ed for balance and posture Supine bridge 2-3 sec hold 2 x 10  Heel to Toe balance EO 30x2 each side  Manual Percussive device to Rt glute med/min/max                                                                                                                              TREATMENT        DATE: 02/15/2024 Manual Percussive device to Rt glute med/min/max  Therex: Sidelying clam shell Rt leg 2 x 10 Supine bridge 2-3 sec hold 2 x 10  Supine figure 4 pull towards 30  sec x 5 Rt  Supine SLR 2 x 5, performed bilaterally (noted difficulty using Rt leg).  Sit to stand to sit 18 inch chair x 10 for LE strength Nustep Lvl 5 10 mins UE/LE for endurance, ROM     PATIENT EDUCATION:  02/15/2024 Education details: HEP update Person educated: Patient Education method: Explanation, Demonstration, Tactile cues, Verbal cues, and Handouts Education comprehension: verbalized understanding, returned demonstration, verbal cues required, and tactile  cues required  HOME EXERCISE PROGRAM: Access Code: [email or web address] URL: [email or web address] Date: 02/15/2024 Prepared by: Ozell Silvan  Exercises - Supine Bridge  - 1 x daily - 7 x weekly - 2 sets - 10 reps - 2-3s hold - Supine Piriformis Stretch with Foot on Ground  - 1-2 x daily - 7 x weekly - 1 sets - 5 reps - 30 hold - Clamshell (Mirrored)  - 1-2 x daily - 7 x weekly - 2-3 sets - 10-15 reps - Standing Hip Abduction with Counter Support  - 1 x daily - 7 x weekly - 2 sets - 10 reps - Standing Hip Extension with Counter Support  - 1 x daily - 7 x weekly - 2 sets - 10 reps - Mini Squat with Counter Support  - 1 x daily - 7 x weekly - 2 sets - 10 reps - Supine Active Straight Leg Raise  - 1 x daily - 7 x weekly - 2 sets - 10 reps - 2-3s hold  ASSESSMENT:  CLINICAL IMPRESSION: ***Pt challenged by EC with tandem stance-needed tactile cuing and use of bars with swaying.  STG completed.  OBJECTIVE IMPAIRMENTS: Abnormal gait, decreased activity tolerance, decreased balance, decreased mobility, difficulty walking, decreased ROM, decreased strength, improper body mechanics, and postural dysfunction.   ACTIVITY LIMITATIONS: bending, standing, and squatting  PARTICIPATION LIMITATIONS: community activity and occupation  PERSONAL FACTORS: 3+ comorbidities: anemia, DM2, arthritis, asthma, glaucoma, GERD, prior TIA, HTN, kidney disease are also affecting patient's functional outcome.   REHAB POTENTIAL: Good  CLINICAL DECISION MAKING: Stable/uncomplicated  EVALUATION COMPLEXITY: Low   GOALS: Goals reviewed with patient? Yes  SHORT TERM GOALS: Target date: 02/22/2024 Patient will be compliant with initial HEP.  Baseline: Goal status: MET 03/08/24  2.  Patient will report pain level no greater than 4/10 to show improved overall quality of life. Baseline:  Goal status: MET 03/08/24   LONG TERM GOALS: Target date: 03/28/2024***  Patient will be independent with final HEP to  maintain and progress upon functional gains made within PT.  Baseline:  Goal status: INITIAL  2.  Patient will increase PSFS to at least 7 to show a significant improvement in subjective disability rating. Baseline:  Goal status: INITIAL  3.  Patient will report pain levels no greater than 2/10 in order to show improved overall quality of life. Baseline:  Goal status: INITIAL  4.  Patient will increase bilat hip abduction MMT to at least 4+/5 in order to show improved biomechanics with functional mobility. Baseline:  Goal status: INITIAL  5.  Patient will increase bilat hip extension MMT to at least 4/5 in order to show improved biomechanics with functional mobility.  Baseline:  Goal status: INITIAL    PLAN:  PT FREQUENCY: 1x/week  PT DURATION: 8 weeks  PLANNED INTERVENTIONS: 97164- PT Re-evaluation, 97750- Physical Performance Testing, 97110-Therapeutic exercises, 97530- Therapeutic activity, [id number]- Neuromuscular re-education, 97535- Self Care, 02859- Manual therapy, [id number]- Gait training, [phone]- Electrical stimulation (unattended), [id number]- Electrical stimulation (manual), [id number]- Vasopneumatic device, [id number]- Ultrasound, [id number]- Traction (mechanical), [id number]- Ionotophoresis  4mg /ml Dexamethasone , 20560 (1-2 muscles), 20561 (3+ muscles)- Dry Needling, Patient/Family education, Balance training, Stair training, Taping, Joint mobilization, Joint manipulation, Spinal manipulation, Spinal mobilization, Scar mobilization, Cryotherapy, and Moist heat  PLAN FOR NEXT SESSION: ***Continue manual as needed for symptom relief.  Improve hip strength/control.   Burnard Meth, PT 03/21/24  8:27 AM

## 2024-03-22 ENCOUNTER — Ambulatory Visit: Admitting: Orthopedic Surgery

## 2024-03-22 ENCOUNTER — Other Ambulatory Visit (INDEPENDENT_AMBULATORY_CARE_PROVIDER_SITE_OTHER): Payer: Self-pay

## 2024-03-22 ENCOUNTER — Encounter

## 2024-03-22 DIAGNOSIS — M25511 Pain in right shoulder: Secondary | ICD-10-CM | POA: Diagnosis not present

## 2024-03-23 ENCOUNTER — Ambulatory Visit
Admission: RE | Admit: 2024-03-23 | Discharge: 2024-03-23 | Disposition: A | Discharge status: Home / Self Care | Source: Ambulatory Visit | Attending: Orthopedic Surgery | Admitting: Orthopedic Surgery

## 2024-03-23 ENCOUNTER — Encounter: Payer: Self-pay | Admitting: Orthopedic Surgery

## 2024-03-23 DIAGNOSIS — M25511 Pain in right shoulder: Secondary | ICD-10-CM

## 2024-03-23 NOTE — Progress Notes (Unsigned)
 Office Visit Note   Patient: [name]           Date of Birth: 05-26-1956           MRN: [id number] Visit Date: 03/22/2024 Requested by: Seabron Lenis, MD [phone] [place],  [place] [address] PCP: Seabron Lenis, MD  Subjective: Chief Complaint  Patient presents with   Right Shoulder - Pain    HPI: [name] is a 68 y.o. female who presents to the office reporting right shoulder pain.  Patient underwent right reverse shoulder replacement on 11/19/2022.  She also had lipoma removed at that time.  Has had pain for about 1 month which comes and goes.  Denies any history of injury.  Pain does wake her from sleep at night.  Reports decreased range of motion at times.  No radicular pain no numbness and tingling no neck pain no scapular pain.  Tylenol  no relief.  The pain is primarily in the deltoid region.  She denies any fevers and chills.  Sometimes it is hard for her to do her hair..                ROS: All systems reviewed are negative as they relate to the chief complaint within the history of present illness.  Patient denies fevers or chills.  Assessment & Plan: Visit Diagnoses:  1. Right shoulder pain, unspecified chronicity     Plan: Impression is right shoulder pain.  More in the deltoid than the acromion.  Would like to get CT scan just to rule out evolving acromial fracture.  Radiographs look good and her motion is excellent.  Follow-Up Instructions: No follow-ups on file.   Orders:  Orders Placed This Encounter  Procedures   XR Shoulder Right   CT SHOULDER RIGHT WO CONTRAST   No orders of the defined types were placed in this encounter.     Procedures: No procedures performed   Clinical Data: No additional findings.  Objective: Vital Signs: There were no vitals taken for this visit.  Physical Exam:  Constitutional: Patient appears well-developed HEENT:  Head: Normocephalic Eyes:EOM are normal Neck: Normal range of  motion Cardiovascular: Normal rate Pulmonary/chest: Effort normal Neurologic: Patient is alert Skin: Skin is warm Psychiatric: Patient has normal mood and affect  Ortho Exam: Ortho exam demonstrates forward flexion and abduction above 90 degrees.  Has good external rotation internal rotation strength with no crepitus with passive range of motion.  No discrete acromial tenderness.  Mild AC joint tenderness.  No biceps symptoms.  Specialty Comments:  No specialty comments available.  Imaging: XR Shoulder Right Result Date: 03/23/2024 AP axillary outlet radiographs right shoulder reviewed.  Reverse shoulder prosthesis in good position alignment.  No complicating features.  Shoulder is located.  Acromial fracture absent.  AC joint arthritis present  CT SHOULDER RIGHT WO CONTRAST Result Date: 03/23/2024 CLINICAL DATA:  Right shoulder pain following arthroplasty 1 year ago. Evaluate acromial fracture. EXAM: CT OF THE UPPER RIGHT EXTREMITY WITHOUT CONTRAST TECHNIQUE: Multidetector CT imaging of the right shoulder was performed according to the standard protocol. RADIATION DOSE REDUCTION: This exam was performed according to the departmental dose-optimization program which includes automated exposure control, adjustment of the mA and/or kV according to patient size and/or use of iterative reconstruction technique. COMPARISON:  Radiographs 03/22/2024 and 06/11/2023. Preoperative MRI 11/13/2022 and CT 08/03/2022. Chest CT 01/28/2022. FINDINGS: Bones/Joint/Cartilage Status post right shoulder reverse arthroplasty. The hardware is intact without loosening. No evidence of  acute fracture or dislocation. The acromion, coracoid process and clavicle are intact. There are mild acromioclavicular degenerative changes. No significant joint effusion. Unchanged 9 mm ossific density in the axillary recess, likely dystrophic. Ligaments Suboptimally assessed by CT. Muscles and Tendons Moderate to severe atrophy of the  supraspinatus, infraspinatus and subscapularis muscles. Atrophy appears progressive compared with preoperative CT. No acute findings identified. Soft tissues No periarticular fluid collection, pseudotumor or unexpected foreign body identified. 4 mm right lower lobe nodule on image 88/2 is unchanged from previous chest CT, considered benign. 3.0 cm left thyroid  nodule is stable from previous chest CT and has been previously evaluated by thyroid  ultrasound and biopsy in 2020. IMPRESSION: 1. Status post right shoulder reverse arthroplasty without evidence of hardware loosening, acute fracture or dislocation. No acute osseous findings. 2. Moderate to severe atrophy of the supraspinatus, infraspinatus and subscapularis muscles, progressive compared with preoperative CT. 3. No periarticular fluid collection, pseudotumor or unexpected foreign body identified. Electronically Signed   By: Elsie Perone M.D.   On: 03/23/2024 09:34     PMFS History: Patient Active Problem List   Diagnosis Date Noted   Arthritis of right shoulder region 11/22/2022   Biceps tendonitis on right 11/22/2022   Lipoma of right shoulder 11/22/2022   S/P reverse total shoulder arthroplasty, right 11/19/2022   Poorly controlled persistent asthma 02/27/2022   Pain in right shoulder 02/09/2022   Asthma exacerbation 09/19/2020   Anemia 10/13/2019   Glaucoma 10/13/2019   Hypertensive disorder 10/13/2019   Uterine leiomyoma 10/13/2019   Vitamin D  insufficiency 04/26/2019   TIA (transient ischemic attack) 03/30/2019   Hyperlipidemia 03/30/2019   Thyroid  nodule 03/30/2019   Stroke-like episode s/p IV tPA 03/28/2019   GERD (gastroesophageal reflux disease) 06/19/2018   Constipation 06/19/2018   Other fatigue 08/02/2017   Shortness of breath on exertion 08/02/2017   Mass of axilla 07/07/2017   Abdominal pain, epigastric    Abnormal CT of the abdomen    Pancreatitis, acute    Acute pancreatitis 08/13/2015   Lower urinary tract  infectious disease 08/13/2015   Severe obesity (BMI >= 40) (HCC) 01/23/2015   Sinusitis, chronic 01/08/2015   Cough variant asthma 01/03/2015   Arthritis of right hip 12/12/2013   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   Benign neoplasm of colon 08/22/2013   Special screening for malignant neoplasms, colon 08/22/2013   Abdominal pain 08/19/2013   Diabetes mellitus without complication (HCC) 08/19/2013   Essential hypertension, benign 08/19/2013   Ventral hernia 08/19/2013   Past Medical History:  Diagnosis Date   Anemia 2006   required transfusion post TAH/BSO 05/2005   Arthritis    Asthma    Chronic cough    Colon polyps    hyperplastic   Diabetes mellitus without complication (HCC)    Diverticulosis    GERD (gastroesophageal reflux disease)    on prilosec   GLA deficiency (HCC)    Glaucoma of both eyes    Hypertension    Joint pain    Osteoarthritis    Pancreatitis    Shortness of breath dyspnea    Stroke [facility])    TIA 2022   Vitamin D  deficiency     Family History  Problem Relation Age of Onset   Emphysema Mother        smoked   Diabetes Mother    Hypertension Mother    Colon cancer Father        7-s   Hypertension Father    Heart  disease Father    High Cholesterol Father    Breast cancer Sister    Esophageal cancer Neg Hx    Rectal cancer Neg Hx    Stomach cancer Neg Hx     Past Surgical History:  Procedure Laterality Date   BICEPT TENODESIS Right 11/19/2022   Procedure: BICEPS TENODESIS;  Surgeon: Addie Cordella Hamilton, MD;  [facility] OR;  Service: Orthopedics;  Laterality: Right;   BREAST EXCISIONAL BIOPSY Right 1990   COLONOSCOPY N/A 08/22/2013   Procedure: COLONOSCOPY;  Surgeon: Norleen LOISE Kiang, MD;  Location: [facility] ENDOSCOPY;  Service: Endoscopy;  Laterality: N/A;   ESOPHAGOGASTRODUODENOSCOPY N/A 08/22/2013   Procedure: ESOPHAGOGASTRODUODENOSCOPY (EGD);  Surgeon: Norleen LOISE Kiang, MD;  Location: [facility] ENDOSCOPY;  Service: Endoscopy;  Laterality:  N/A;   ESOPHAGOGASTRODUODENOSCOPY (EGD) WITH PROPOFOL  N/A 08/19/2015   Procedure: ESOPHAGOGASTRODUODENOSCOPY (EGD) WITH PROPOFOL ;  Surgeon: Norleen LOISE Kiang, MD;  Location: [facility] ENDOSCOPY;  Service: Endoscopy;  Laterality: N/A;   HYSTEROSCOPY WITH D & C  01/2005   for uterine fibroids.    INSERTION OF MESH N/A 08/24/2013   Procedure: INSERTION OF MESH;  Surgeon: Morene ONEIDA Olives, MD;  Location: [facility] OR;  Service: General;  Laterality: N/A;   LIPOMA EXCISION Left 06/21/2015   Procedure: EXCISION OF LEFT SCALP LIPOMA;  Surgeon: Donnice Lunger, MD;  Location: [facility];  Service: General;  Laterality: Left;   PANNICULECTOMY N/A 08/24/2013   Procedure: PANNICULECTOMY;  Surgeon: Morene ONEIDA Olives, MD;  Location: [facility] OR;  Service: General;  Laterality: N/A;   REVERSE SHOULDER ARTHROPLASTY Right 11/19/2022   Procedure: REVERSE SHOULDER ARTHROPLASTY, LIPOMA REMOVAL;  Surgeon: Addie Cordella Hamilton, MD;  Location: [facility] OR;  Service: Orthopedics;  Laterality: Right;   TOTAL ABDOMINAL HYSTERECTOMY W/ BILATERAL SALPINGOOPHORECTOMY  05/2005   TOTAL HIP ARTHROPLASTY Right 12/12/2013   Procedure: RIGHT TOTAL HIP ARTHROPLASTY ANTERIOR APPROACH;  Surgeon: Lonni CINDERELLA Poli, MD;  Location: [facility] OR;  Service: Orthopedics;  Laterality: Right;   VENTRAL HERNIA REPAIR N/A 08/24/2013   Procedure: HERNIA REPAIR VENTRAL ADULT;  Surgeon: Morene ONEIDA Olives, MD;  Location: [facility] OR;  Service: General;  Laterality: N/A;   Social History   Occupational History   Occupation: Child psychotherapist  Tobacco Use   Smoking status: Former    Current packs/day: 0.00    Average packs/day: 0.3 packs/day for 15.0 years (3.8 ttl pk-yrs)    Types: Cigarettes    Start date: 06/23/1963    Quit date: 06/22/1978    Years since quitting: 45.7   Smokeless tobacco: Never  Vaping Use   Vaping status: Never Used  Substance and Sexual Activity   Alcohol use: No    Alcohol/week: 0.0 standard drinks of alcohol   Drug use: No   Sexual  activity: Yes

## 2024-03-24 ENCOUNTER — Telehealth: Payer: Self-pay

## 2024-03-24 NOTE — Telephone Encounter (Signed)
-----   Message from KANDICE Glendia Hutchinson sent at 03/24/2024  7:10 AM EDT ----- Erskin Maxwell does she have follow-up and if she does not if you can just send me a note and I can call her and tell her her scan looks good.  Thanks

## 2024-03-28 NOTE — Therapy (Signed)
 OUTPATIENT PHYSICAL THERAPY TREATMENT/RECERTIFICATION   Patient Name: [name] MRN: [id number] DOB:01/10/1956, 68 y.o., female Today's Date: 03/30/2024  END OF SESSION:  PT End of Session - 03/29/24 1531     Visit Number 5    Number of Visits 18    Date for Recertification  05/24/24    Authorization Type [employer] STATE [id number]    PT Start Time 1520    PT Stop Time 1558    PT Time Calculation (min) 38 min    Activity Tolerance Patient tolerated treatment well    Behavior During Therapy [facility] for tasks assessed/performed              Past Medical History:  Diagnosis Date   Anemia 2006   required transfusion post TAH/BSO 05/2005   Arthritis    Asthma    Chronic cough    Colon polyps    hyperplastic   Diabetes mellitus without complication (HCC)    Diverticulosis    GERD (gastroesophageal reflux disease)    on prilosec   GLA deficiency (HCC)    Glaucoma of both eyes    Hypertension    Joint pain    Osteoarthritis    Pancreatitis    Shortness of breath dyspnea    Stroke [facility])    TIA 2022   Vitamin D  deficiency    Past Surgical History:  Procedure Laterality Date   BICEPT TENODESIS Right 11/19/2022   Procedure: BICEPS TENODESIS;  Surgeon: Addie Cordella Hamilton, MD;  Location: [facility] OR;  Service: Orthopedics;  Laterality: Right;   BREAST EXCISIONAL BIOPSY Right 1990   COLONOSCOPY N/A 08/22/2013   Procedure: COLONOSCOPY;  Surgeon: Norleen LOISE Kiang, MD;  Location: [facility] ENDOSCOPY;  Service: Endoscopy;  Laterality: N/A;   ESOPHAGOGASTRODUODENOSCOPY N/A 08/22/2013   Procedure: ESOPHAGOGASTRODUODENOSCOPY (EGD);  Surgeon: Norleen LOISE Kiang, MD;  Location: [facility] ENDOSCOPY;  Service: Endoscopy;  Laterality: N/A;   ESOPHAGOGASTRODUODENOSCOPY (EGD) WITH PROPOFOL  N/A 08/19/2015   Procedure: ESOPHAGOGASTRODUODENOSCOPY (EGD) WITH PROPOFOL ;  Surgeon: Norleen LOISE Kiang, MD;  Location: [facility] ENDOSCOPY;  Service: Endoscopy;  Laterality: N/A;   HYSTEROSCOPY WITH D & C  01/2005   for uterine fibroids.     INSERTION OF MESH N/A 08/24/2013   Procedure: INSERTION OF MESH;  Surgeon: Morene ONEIDA Olives, MD;  Location: [facility] OR;  Service: General;  Laterality: N/A;   LIPOMA EXCISION Left 06/21/2015   Procedure: EXCISION OF LEFT SCALP LIPOMA;  Surgeon: Donnice Lunger, MD;  Location: [facility];  Service: General;  Laterality: Left;   PANNICULECTOMY N/A 08/24/2013   Procedure: PANNICULECTOMY;  Surgeon: Morene ONEIDA Olives, MD;  Location: [facility] OR;  Service: General;  Laterality: N/A;   REVERSE SHOULDER ARTHROPLASTY Right 11/19/2022   Procedure: REVERSE SHOULDER ARTHROPLASTY, LIPOMA REMOVAL;  Surgeon: Addie Cordella Hamilton, MD;  Location: [facility] OR;  Service: Orthopedics;  Laterality: Right;   TOTAL ABDOMINAL HYSTERECTOMY W/ BILATERAL SALPINGOOPHORECTOMY  05/2005   TOTAL HIP ARTHROPLASTY Right 12/12/2013   Procedure: RIGHT TOTAL HIP ARTHROPLASTY ANTERIOR APPROACH;  Surgeon: Lonni CINDERELLA Poli, MD;  Location: [facility] OR;  Service: Orthopedics;  Laterality: Right;   VENTRAL HERNIA REPAIR N/A 08/24/2013   Procedure: HERNIA REPAIR VENTRAL ADULT;  Surgeon: Morene ONEIDA Olives, MD;  Location: [facility] OR;  Service: General;  Laterality: N/A;   Patient Active Problem List   Diagnosis Date Noted   Arthritis of right shoulder region 11/22/2022   Biceps tendonitis on right 11/22/2022   Lipoma of right shoulder 11/22/2022   S/P reverse total shoulder arthroplasty, right  11/19/2022   Poorly controlled persistent asthma 02/27/2022   Pain in right shoulder 02/09/2022   Asthma exacerbation 09/19/2020   Anemia 10/13/2019   Glaucoma 10/13/2019   Hypertensive disorder 10/13/2019   Uterine leiomyoma 10/13/2019   Vitamin D  insufficiency 04/26/2019   TIA (transient ischemic attack) 03/30/2019   Hyperlipidemia 03/30/2019   Thyroid  nodule 03/30/2019   Stroke-like episode s/p IV tPA 03/28/2019   GERD (gastroesophageal reflux disease) 06/19/2018   Constipation 06/19/2018   Other fatigue 08/02/2017   Shortness of breath  on exertion 08/02/2017   Mass of axilla 07/07/2017   Abdominal pain, epigastric    Abnormal CT of the abdomen    Pancreatitis, acute    Acute pancreatitis 08/13/2015   Lower urinary tract infectious disease 08/13/2015   Severe obesity (BMI >= 40) (HCC) 01/23/2015   Sinusitis, chronic 01/08/2015   Cough variant asthma 01/03/2015   Arthritis of right hip 12/12/2013   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   Benign neoplasm of colon 08/22/2013   Special screening for malignant neoplasms, colon 08/22/2013   Abdominal pain 08/19/2013   Diabetes mellitus without complication (HCC) 08/19/2013   Essential hypertension, benign 08/19/2013   Ventral hernia 08/19/2013    PCP: Alm Rav, MD  REFERRING PROVIDER: Lonni CINDERELLA Poli, MD   REFERRING DIAG: [phone] (ICD-10-CM) - Pain in right hip  THERAPY DIAG:  Muscle weakness (generalized)  Difficulty in walking, not elsewhere classified  Pain in right hip  Stiffness of right hip, not elsewhere classified  Other abnormalities of gait and mobility  Rationale for Evaluation and Treatment: Rehabilitation  ONSET DATE: 6 months ago ; off and on for about a year   SUBJECTIVE:   SUBJECTIVE STATEMENT: Pt reports the hip is not painful but she still limps. PERTINENT HISTORY: Patient has undergone right THA in 2015 and is now experiencing right hip pain to include trochanteric bursitis. She has received a cortisone shot for this problem in the past which helped in the short term. MD sent her to PT to see if PT can resolve/assist pain prior to doing another injection.   PAIN:  NPRS scale: 0/10 Pain location: right hip  Pain description: throbbing Aggravating factors: nothing makes it worse, it is just constant Relieving factors: time, massaging, voltaren (used but doesn't work)  PRECAUTIONS: None  RED FLAGS: None   WEIGHT BEARING RESTRICTIONS: No  FALLS:  Has patient fallen in last 6 months? 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  DIAGNOSTIC FINDINGS: An AP pelvis and lateral of the right hip shows a total hip arthroplasty  with no complicating features.   PATIENT SURVEYS:  PSFS: THE PATIENT SPECIFIC FUNCTIONAL SCALE  Place score of 0-10 (0 = unable to perform activity and 10 = able to perform activity at the same level as before injury or problem)  Activity Date: 02/01/2024 Date ; 03/29/24   Get on my knees   6 6   2. Walk without a limp   4 4  3.     4.      Total Score 5 5     Total Score = Sum of activity scores/number of activities  Minimally Detectable Change: 3 points (for single activity); 2 points (for average score)  COGNITION: 02/01/2024 Overall cognitive status: Within functional limits for tasks assessed     SENSATION: 02/01/2024 Light touch: WFL  EDEMA:  02/01/2024 Not tested on eval  MUSCLE LENGTH: 02/01/2024 Not tested on eval   POSTURE: 02/01/2024  rounded shoulders, forward head, and increased thoracic kyphosis  PALPATION: 02/01/2024 TTP at right greater trochanter   LOWER EXTREMITY ROM:  ROM Right Eval 02/01/2024 Left Eval 02/01/2024  Hip flexion    Hip extension    Hip abduction    Hip adduction    Hip internal rotation    Hip external rotation    Knee flexion    Knee extension    Ankle dorsiflexion    Ankle plantarflexion    Ankle inversion    Ankle eversion     (Blank rows = not  tested)  LOWER EXTREMITY MMT:  MMT Right Eval 02/01/2024 Left Eval 02/01/2024 Right 02/15/2024 Right  03/29/24  Hip flexion 3+/5 3+/5  4-  Hip extension 3/5 3/5  3+  Hip abduction 3+/5 3+/5 3+/5 3+  Hip adduction      Hip internal rotation      Hip external rotation      Knee flexion 4+/5 5/5    Knee extension 5/5 5/5    Ankle dorsiflexion      Ankle plantarflexion      Ankle inversion      Ankle eversion       (Blank rows = not tested)  LOWER EXTREMITY SPECIAL TESTS:  02/01/2024 Hip special tests: Belvie (FABER) test: negative, Ober's test: negative, and FADIR: negative   FUNCTIONAL TESTS:  02/01/2024 Did not assess on eval   GAIT: 02/01/2024 Distance walked: not formally assessed  Assistive device utilized: None Level of assistance: supervision Comments: antalgic gait pattern, Rt lateral lean with right stance, decreased stance time on Rt LE                  TREATMENT         Date-  03/29/24 Neuro Re Ed for balance and posture Supine bridge 2-3 sec hold 3 x 10  Heel to Toe balance EO 30x2 each side EC 2x each side  TherEx  Rec bike lev 2 8 min LAQ 3x10 bil  3 # Sidelying clam shell Rt leg 2 x 10  TherAct for functional activities - bending, standing, squatting Mini squats 2x10 Sit to stand 2x10 with 5#KB ball    Manual Percussive device to Rt glute med/min/max    DATE: 03/08/24   [id number]  Neuro Re Ed for balance and posture Supine bridge 2-3 sec hold 2 x 10  Heel to Toe balance EO 30x2 each side EC 2x each side  TherEx  Rec bike lev 2 8 min SAQ 2x10 bil  2.5 # Sidelying clam shell Rt leg 2 x 10  TherAct for functional activities - bending, standing, squatting Mini squats 2x10 Sit to stand 2x10 with 2# ball at 18    TREATMENT        DATE: 02/23/24  There Ex for ROM, strength Rec bike lev 2 8 min SAQ 2x10 bil  2# Sidelying clam shell Rt leg 2 x 10 There Act for functional activities - bending, standing, squatting Mini squats  Sit to  stand 3x5 with 2# ball at 18  Neuro Re Ed for balance and posture Supine bridge 2-3 sec hold 2 x 10  Heel to Toe balance EO 30x2 each side  Manual Percussive device to Rt glute med/min/max                                                                                                                              TREATMENT        DATE: 02/15/2024 Manual Percussive device to Rt glute med/min/max  Therex: Sidelying clam shell Rt leg 2 x 10 Supine bridge 2-3 sec hold 2 x 10  Supine figure 4 pull towards 30 sec x 5 Rt  Supine SLR 2 x 5, performed bilaterally (noted difficulty using Rt leg).  Sit to stand to sit 18 inch chair x 10 for LE strength Nustep Lvl 5 10 mins UE/LE for endurance, ROM     PATIENT EDUCATION:  02/15/2024 Education details: HEP update Person educated: Patient Education method: Explanation, Demonstration, Tactile cues, Verbal cues, and Handouts Education comprehension: verbalized understanding, returned demonstration, verbal cues required, and tactile cues required  HOME EXERCISE PROGRAM: Access Code: [email or web address] URL: [email or web address] Date: 02/15/2024 Prepared by: Ozell Silvan  Exercises - Supine Bridge  - 1 x daily - 7 x weekly - 2 sets - 10 reps - 2-3s hold - Supine Piriformis Stretch with Foot on Ground  - 1-2 x daily - 7 x weekly - 1 sets - 5 reps - 30 hold - Clamshell (Mirrored)  - 1-2 x daily - 7 x weekly - 2-3 sets - 10-15 reps - Standing Hip Abduction with Counter Support  - 1 x daily - 7 x weekly - 2 sets - 10 reps - Standing Hip Extension with Counter Support  - 1 x daily - 7 x weekly - 2 sets - 10 reps - Mini Squat with Counter Support  - 1 x daily - 7 x weekly - 2 sets - 10 reps - Supine Active Straight Leg Raise  - 1 x daily - 7 x weekly - 2 sets - 10 reps - 2-3s hold  ASSESSMENT:  CLINICAL IMPRESSION: Pt has had 4 PT sessions and progress to increase strength and decrease altered gait is slow.  STG have been achieved but pt  needs to be more consistent with appointments to achieve LTG.  Pt still presents with decreased hip strength, altered gait and occasional pain.  She would benefit from continued skilled PT to address these deficits.  OBJECTIVE IMPAIRMENTS: Abnormal gait, decreased activity tolerance, decreased balance, decreased mobility, difficulty walking, decreased ROM, decreased strength, improper body mechanics, and postural dysfunction.   ACTIVITY LIMITATIONS: bending, standing, and squatting  PARTICIPATION LIMITATIONS: community activity and occupation  PERSONAL FACTORS: 3+ comorbidities: anemia, DM2, arthritis, asthma, glaucoma, GERD, prior TIA, HTN, kidney disease are also affecting patient's functional outcome.   REHAB POTENTIAL: Good  CLINICAL DECISION MAKING: Stable/uncomplicated  EVALUATION COMPLEXITY: Low   GOALS: Goals  reviewed with patient? Yes  SHORT TERM GOALS: Target date: 02/22/2024 Patient will be compliant with initial HEP.  Baseline: Goal status: MET 03/08/24  2.  Patient will report pain level no greater than 4/10 to show improved overall quality of life. Baseline:  Goal status: MET 03/08/24   LONG TERM GOALS: Target date: 05/24/2024 8 weeks   Patient will be independent with final HEP to maintain and progress upon functional gains made within PT.  Baseline:  Goal status: ONGOING 03/29/24 2.  Patient will increase PSFS to at least 7 to show a significant improvement in subjective disability rating. Baseline:  Goal status: Ongoing 03/29/24  3.  Patient will report pain levels no greater than 2/10 in order to show improved overall quality of life. Baseline:  Goal status: Pain 0/10 but not consistent  4.  Patient will increase bilat hip abduction MMT to at least 4+/5 in order to show improved biomechanics with functional mobility. Baseline:  Goal status:  Ongoing 03/29/24  5.  Patient will increase bilat hip extension MMT to at least 4/5 in order to show improved  biomechanics with functional mobility.  Baseline:  Goal status:  Ongoing 03/29/24   PLAN:  PT FREQUENCY: 1x/week  PT DURATION: 8 weeks  PLANNED INTERVENTIONS: [id number]- PT Re-evaluation, 97750- Physical Performance Testing, 97110-Therapeutic exercises, 97530- Therapeutic activity, [id number]- Neuromuscular re-education, 97535- Self Care, [id number]- Manual therapy, [phone]- Gait training, [phone]- Electrical stimulation (unattended), [phone]- Electrical stimulation (manual), [id number]- Vasopneumatic device, [id number]- Ultrasound, [id number]- Traction (mechanical), [id number]- Ionotophoresis 4mg /ml Dexamethasone , [id number] (1-2 muscles), [id number] (3+ muscles)- Dry Needling, Patient/Family education, Balance training, Stair training, Taping, Joint mobilization, Joint manipulation, Spinal manipulation, Spinal mobilization, Scar mobilization, Cryotherapy, and Moist heat  PLAN FOR NEXT SESSION: Continue with program  improve hip strength/control.   Burnard Meth, PT 03/30/24  8:43 AM

## 2024-03-29 ENCOUNTER — Ambulatory Visit

## 2024-03-29 DIAGNOSIS — M6281 Muscle weakness (generalized): Secondary | ICD-10-CM | POA: Diagnosis not present

## 2024-03-29 DIAGNOSIS — M25551 Pain in right hip: Secondary | ICD-10-CM

## 2024-03-29 DIAGNOSIS — R262 Difficulty in walking, not elsewhere classified: Secondary | ICD-10-CM | POA: Diagnosis not present

## 2024-03-29 DIAGNOSIS — R2689 Other abnormalities of gait and mobility: Secondary | ICD-10-CM

## 2024-03-29 DIAGNOSIS — M25651 Stiffness of right hip, not elsewhere classified: Secondary | ICD-10-CM

## 2024-04-05 ENCOUNTER — Encounter: Payer: Self-pay | Admitting: Orthopedic Surgery

## 2024-04-05 NOTE — Telephone Encounter (Signed)
 I sent her MyChart message.  CT scan looks good.  No fracture.

## 2024-04-14 ENCOUNTER — Ambulatory Visit: Admitting: Rehabilitative and Restorative Service Providers"

## 2024-04-14 ENCOUNTER — Encounter: Payer: Self-pay | Admitting: Rehabilitative and Restorative Service Providers"

## 2024-04-14 DIAGNOSIS — M6281 Muscle weakness (generalized): Secondary | ICD-10-CM | POA: Diagnosis not present

## 2024-04-14 DIAGNOSIS — R262 Difficulty in walking, not elsewhere classified: Secondary | ICD-10-CM

## 2024-04-14 DIAGNOSIS — M25651 Stiffness of right hip, not elsewhere classified: Secondary | ICD-10-CM

## 2024-04-14 DIAGNOSIS — R2689 Other abnormalities of gait and mobility: Secondary | ICD-10-CM

## 2024-04-14 DIAGNOSIS — M25551 Pain in right hip: Secondary | ICD-10-CM

## 2024-04-14 NOTE — Therapy (Signed)
 OUTPATIENT PHYSICAL THERAPY TREATMENT NOTE   Patient Name: [name] MRN: [id number] DOB:December 17, 1955, 68 y.o., female Today's Date: 04/14/2024  END OF SESSION:  PT End of Session - 04/14/24 1519     Visit Number 6    Number of Visits 18    Date for Recertification  05/24/24    Authorization Type [employer] STATE [id number]    PT Start Time 1518    PT Stop Time 1608    PT Time Calculation (min) 50 min    Activity Tolerance Patient tolerated treatment well;No increased pain;Patient limited by fatigue    Behavior During Therapy [facility] for tasks assessed/performed               Past Medical History:  Diagnosis Date   Anemia 2006   required transfusion post TAH/BSO 05/2005   Arthritis    Asthma    Chronic cough    Colon polyps    hyperplastic   Diabetes mellitus without complication (HCC)    Diverticulosis    GERD (gastroesophageal reflux disease)    on prilosec   GLA deficiency (HCC)    Glaucoma of both eyes    Hypertension    Joint pain    Osteoarthritis    Pancreatitis    Shortness of breath dyspnea    Stroke [facility])    TIA 2022   Vitamin D  deficiency    Past Surgical History:  Procedure Laterality Date   BICEPT TENODESIS Right 11/19/2022   Procedure: BICEPS TENODESIS;  Surgeon: Addie Cordella Hamilton, MD;  Location: [facility] OR;  Service: Orthopedics;  Laterality: Right;   BREAST EXCISIONAL BIOPSY Right 1990   COLONOSCOPY N/A 08/22/2013   Procedure: COLONOSCOPY;  Surgeon: Norleen LOISE Kiang, MD;  Location: [facility] ENDOSCOPY;  Service: Endoscopy;  Laterality: N/A;   ESOPHAGOGASTRODUODENOSCOPY N/A 08/22/2013   Procedure: ESOPHAGOGASTRODUODENOSCOPY (EGD);  Surgeon: Norleen LOISE Kiang, MD;  Location[facility];  Service: Endoscopy;  Laterality: N/A;   ESOPHAGOGASTRODUODENOSCOPY (EGD) WITH PROPOFOL  N/A 08/19/2015   Procedure: ESOPHAGOGASTRODUODENOSCOPY (EGD) WITH PROPOFOL ;  Surgeon: Norleen LOISE Kiang, MD;  Location: [facility] ENDOSCOPY;  Service: Endoscopy;  Laterality: N/A;   HYSTEROSCOPY WITH D & C   01/2005   for uterine fibroids.    INSERTION OF MESH N/A 08/24/2013   Procedure: INSERTION OF MESH;  Surgeon: Morene ONEIDA Olives, MD;  Location: [facility] OR;  Service: General;  Laterality: N/A;   LIPOMA EXCISION Left 06/21/2015   Procedure: EXCISION OF LEFT SCALP LIPOMA;  Surgeon: Donnice Lunger, MD;  Location: [facility];  Service: General;  Laterality: Left;   PANNICULECTOMY N/A 08/24/2013   Procedure: PANNICULECTOMY;  Surgeon: Morene ONEIDA Olives, MD;  Location: [facility] OR;  Service: General;  Laterality: N/A;   REVERSE SHOULDER ARTHROPLASTY Right 11/19/2022   Procedure: REVERSE SHOULDER ARTHROPLASTY, LIPOMA REMOVAL;  Surgeon: Addie Cordella Hamilton, MD;  Location: [facility] OR;  Service: Orthopedics;  Laterality: Right;   TOTAL ABDOMINAL HYSTERECTOMY W/ BILATERAL SALPINGOOPHORECTOMY  05/2005   TOTAL HIP ARTHROPLASTY Right 12/12/2013   Procedure: RIGHT TOTAL HIP ARTHROPLASTY ANTERIOR APPROACH;  Surgeon: Lonni CINDERELLA Poli, MD;  Location: [facility] OR;  Service: Orthopedics;  Laterality: Right;   VENTRAL HERNIA REPAIR N/A 08/24/2013   Procedure: HERNIA REPAIR VENTRAL ADULT;  Surgeon: Morene ONEIDA Olives, MD;  Location: [facility] OR;  Service: General;  Laterality: N/A;   Patient Active Problem List   Diagnosis Date Noted   Arthritis of right shoulder region 11/22/2022   Biceps tendonitis on right 11/22/2022   Lipoma of right shoulder 11/22/2022  S/P reverse total shoulder arthroplasty, right 11/19/2022   Poorly controlled persistent asthma 02/27/2022   Pain in right shoulder 02/09/2022   Asthma exacerbation 09/19/2020   Anemia 10/13/2019   Glaucoma 10/13/2019   Hypertensive disorder 10/13/2019   Uterine leiomyoma 10/13/2019   Vitamin D  insufficiency 04/26/2019   TIA (transient ischemic attack) 03/30/2019   Hyperlipidemia 03/30/2019   Thyroid  nodule 03/30/2019   Stroke-like episode s/p IV tPA 03/28/2019   GERD (gastroesophageal reflux disease) 06/19/2018   Constipation 06/19/2018   Other  fatigue 08/02/2017   Shortness of breath on exertion 08/02/2017   Mass of axilla 07/07/2017   Abdominal pain, epigastric    Abnormal CT of the abdomen    Pancreatitis, acute    Acute pancreatitis 08/13/2015   Lower urinary tract infectious disease 08/13/2015   Severe obesity (BMI >= 40) (HCC) 01/23/2015   Sinusitis, chronic 01/08/2015   Cough variant asthma 01/03/2015   Arthritis of right hip 12/12/2013   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   Benign neoplasm of colon 08/22/2013   Special screening for malignant neoplasms, colon 08/22/2013   Abdominal pain 08/19/2013   Diabetes mellitus without complication (HCC) 08/19/2013   Essential hypertension, benign 08/19/2013   Ventral hernia 08/19/2013    PCP: Alm Rav, MD  REFERRING PROVIDER: Lonni CINDERELLA Poli, MD   REFERRING DIAG: [phone] (ICD-10-CM) - Pain in right hip  THERAPY DIAG:  Muscle weakness (generalized)  Difficulty in walking, not elsewhere classified  Pain in right hip  Stiffness of right hip, not elsewhere classified  Other abnormalities of gait and mobility  Rationale for Evaluation and Treatment: Rehabilitation  ONSET DATE: 6 months ago ; off and on for about a year   SUBJECTIVE:   SUBJECTIVE STATEMENT: Arah reports inconsistent HEP participation.  She expressed some frustration with her gait quality and hip weakness.  PERTINENT HISTORY: Patient has undergone right THA in 2015 and is now experiencing right hip pain to include trochanteric bursitis. She has received a cortisone shot for this problem in the past which helped in the short term. MD sent her to PT to see if PT can resolve/assist pain prior to doing another injection.   PAIN:  NPRS scale: 0-3/10 Pain location: right hip  Pain description: fatigue Aggravating factors: fatigue Relieving factors: time, massaging, voltaren (used but doesn't work)  PRECAUTIONS: None  RED FLAGS: None   WEIGHT BEARING RESTRICTIONS:  No  FALLS:  Has patient fallen in last 6 months? 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  DIAGNOSTIC FINDINGS: An AP pelvis and lateral of the right hip shows a total hip arthroplasty  with no complicating features.   PATIENT SURVEYS:  PSFS: THE PATIENT SPECIFIC FUNCTIONAL SCALE  Place score of 0-10 (0 = unable to perform activity and 10 = able to perform activity at the same level as before injury or problem)  Activity Date: 02/01/2024 Date ; 03/29/24   Get on my knees   6 6   2. Walk without  a limp   4 4   3.     4.      Total Score 5 5     Total Score = Sum of activity scores/number of activities  Minimally Detectable Change: 3 points (for single activity); 2 points (for average score)  COGNITION: 02/01/2024 Overall cognitive status: Within functional limits for tasks assessed     SENSATION: 02/01/2024 Light touch: WFL  EDEMA:  02/01/2024 Not tested on eval  MUSCLE LENGTH: 02/01/2024 Not tested on eval   POSTURE: 02/01/2024  rounded shoulders, forward head, and increased thoracic kyphosis  PALPATION: 02/01/2024 TTP at right greater trochanter   LOWER EXTREMITY ROM:  ROM Left/Right 04/14/2024   Hip flexion 95/90   Hip extension    Hip abduction    Hip adduction    Hip internal rotation 11/7   Hip external rotation 36/33   Knee flexion    Knee extension    Ankle dorsiflexion    Ankle plantarflexion    Ankle  inversion    Ankle eversion    Hamstrings 55/55    (Blank rows = not tested)  LOWER EXTREMITY MMT:  MMT Right Eval 02/01/2024 Left Eval 02/01/2024 Right 02/15/2024 Right  03/29/24 Left/Right 04/14/2024  Hip flexion 3+/5 3+/5  4-   Hip extension 3/5 3/5  3+   Hip abduction 3+/5 3+/5 3+/5 3+ 4-/3+  Hip adduction       Hip internal rotation       Hip external rotation       Knee flexion 4+/5 5/5     Knee extension 5/5 5/5     Ankle dorsiflexion       Ankle plantarflexion       Ankle inversion       Ankle eversion        (Blank rows = not tested)  LOWER EXTREMITY SPECIAL TESTS:  02/01/2024 Hip special tests: [facility]) test: negative, Ober's test: negative, and FADIR: negative   FUNCTIONAL TESTS:  02/01/2024 Did not assess on eval   GAIT: 02/01/2024 Distance walked: not formally assessed  Assistive device utilized: None Level of assistance: supervision Comments: antalgic gait pattern, Rt lateral lean with right stance, decreased stance time on Rt LE                  TREATMENT         Date-  04/14/24 Side-lie clams with Green Thera-Band 3 sets of 10 with slow eccentrics Supine clams with Green Thera-Band 10 with slow eccentrics Single knee to chest with other leg straight 4 x 15 seconds Hip hike in door frame 2 sets of 5 bilateral Hip hike at countertop 5 x each side  [id number]: Discussed anatomy and importance of hip abductors focus with HEP, updated HEP with focus on side-lie clams, hip hike and single knee to chest  Neuromuscular re-education: Tandem balance 4x 20 seconds   Date-  03/29/24 Neuro Re Ed for balance and posture Supine bridge 2-3 sec hold 3 x 10  Heel to Toe balance EO 30x2 each side EC 2x each side  TherEx  Rec bike lev 2 8 min LAQ 3x10 bil  3 # Sidelying clam shell Rt leg 2 x 10  TherAct for functional activities - bending, standing, squatting Mini squats 2x10 Sit to stand 2x10 with 5#KB ball    Manual Percussive device to Rt glute  med/min/max   DATE: 03/08/24   [id number]  Neuro Re Ed for balance and posture Supine bridge 2-3 sec  hold 2 x 10  Heel to Toe balance EO 30x2 each side EC 2x each side  TherEx  Rec bike lev 2 8 min SAQ 2x10 bil  2.5 # Sidelying clam shell Rt leg 2 x 10  TherAct for functional activities - bending, standing, squatting Mini squats 2x10 Sit to stand 2x10 with 2# ball at 18     PATIENT EDUCATION:  02/15/2024 Education details: HEP update Person educated: Patient Education method: Explanation, Demonstration, Tactile cues, Verbal cues, and Handouts Education comprehension: verbalized understanding, returned demonstration, verbal cues required, and tactile cues required  HOME EXERCISE PROGRAM: Access Code: [email or web address] URL: [email or web address] Date: 04/14/2024 Prepared by: Lamar Ivory  Exercises - Supine Piriformis Stretch with Foot on Ground  - 1-2 x daily - 1 x weekly - 1 sets - 5 reps - 30 hold - Clamshell (Mirrored)  - 1-2 x daily - 7 x weekly - 3 sets - 10 reps - Supine Bridge  - 1 x daily - 1 x weekly - 2 sets - 10 reps - 2-3s hold - Standing Hip Abduction with Counter Support  - 1 x daily - 1 x weekly - 2 sets - 10 reps - Standing Hip Extension with Counter Support  - 1 x daily - 1 x weekly - 2 sets - 10 reps - Mini Squat with Counter Support  - 1 x daily - 1 x weekly - 2 sets - 10 reps - Supine Active Straight Leg Raise  - 1 x daily - 1 x weekly - 2 sets - 10 reps - 2-3s hold - Standing Hip Hiking  - 5 x daily - 7 x weekly - 1 sets - 10 reps - 3-10 seconds hold - Single Knee to Chest Stretch  - 1 x daily - 7 x weekly - 1 sets - 5 reps - 15 seconds hold  ASSESSMENT:  CLINICAL IMPRESSION: Hip abductors weakness is the overwhelming focus of Brieana's HEP.  With consistent participation with her updated 3 exercise HEP, she should get stronger, have less fatigue/pain and have a better quality gait pattern with improved endurance.  OBJECTIVE IMPAIRMENTS: Abnormal  gait, decreased activity tolerance, decreased balance, decreased mobility, difficulty walking, decreased ROM, decreased strength, improper body mechanics, and postural dysfunction.   ACTIVITY LIMITATIONS: bending, standing, and squatting  PARTICIPATION LIMITATIONS: community activity and occupation  PERSONAL FACTORS: 3+ comorbidities: anemia, DM2, arthritis, asthma, glaucoma, GERD, prior TIA, HTN, kidney disease are also affecting patient's functional outcome.   REHAB POTENTIAL: Good  CLINICAL DECISION MAKING: Stable/uncomplicated  EVALUATION COMPLEXITY: Low   GOALS: Goals reviewed with patient? Yes  SHORT TERM GOALS: Target date: 02/22/2024 Patient will be compliant with initial HEP.  Baseline: Goal status: MET 03/08/24  2.  Patient will report pain level no greater than 4/10 to show improved overall quality of life. Baseline:  Goal status: MET 03/08/24   LONG TERM GOALS: Target date: 05/24/2024 8 weeks   Patient will be independent with final HEP to maintain and progress upon functional gains made within PT.  Baseline:  Goal status: ONGOING 04/14/24  2.  Patient will increase PSFS to at least 7 to show a significant improvement in subjective disability rating. Baseline:  Goal status: Ongoing 03/29/24  3.  Patient will report pain levels no greater than 2/10 in order to show improved overall quality of life. Baseline:  Goal status: Ongoing 04/14/2024  4.  Patient will increase bilat hip abduction MMT to at least 4+/5 in order to show improved biomechanics with functional  mobility. Baseline:  Goal status:  Ongoing 04/14/24  5.  Patient will increase bilat hip extension MMT to at least 4/5 in order to show improved biomechanics with functional mobility.  Baseline:  Goal status:  Ongoing 04/14/24   PLAN:  PT FREQUENCY: 1x/week  PT DURATION: 8 weeks  PLANNED INTERVENTIONS: 97164- PT Re-evaluation, 97750- Physical Performance Testing, 97110-Therapeutic exercises,  97530- Therapeutic activity, [id number]- Neuromuscular re-education, 97535- Self Care, [id number]- Manual therapy, [id number]- Gait training, [phone]- Electrical stimulation (unattended), [phone]- Electrical stimulation (manual), [id number]- Vasopneumatic device, [id number]- Ultrasound, [id number]- Traction (mechanical), [id number]- Ionotophoresis 4mg /ml Dexamethasone , 79439 (1-2 muscles), 20561 (3+ muscles)- Dry Needling, Patient/Family education, Balance training, Stair training, Taping, Joint mobilization, Joint manipulation, Spinal manipulation, Spinal mobilization, Scar mobilization, Cryotherapy, and Moist heat  PLAN FOR NEXT SESSION: Emphasis on hip abduction strength to correct Trendelenburg gait.  Myer LELON Ivory PT, MPT 04/14/24  4:16 PM

## 2024-04-19 NOTE — Therapy (Addendum)
 OUTPATIENT PHYSICAL THERAPY TREATMENT NOTE/DC   Patient Name: [name] MRN: [id number] DOB:Aug 28, 1955, 68 y.o., female Today's Date: 04/20/2024  END OF SESSION:  PT End of Session - 04/20/24 1640     Visit Number 7    Number of Visits 18    Date for Recertification  05/24/24    Authorization Type [employer] STATE [id number]    PT Start Time 1600    PT Stop Time 1638    PT Time Calculation (min) 38 min    Activity Tolerance Patient tolerated treatment well    Behavior During Therapy [name] for tasks assessed/performed                Past Medical History:  Diagnosis Date   Anemia 2006   required transfusion post TAH/BSO 05/2005   Arthritis    Asthma    Chronic cough    Colon polyps    hyperplastic   Diabetes mellitus without complication (HCC)    Diverticulosis    GERD (gastroesophageal reflux disease)    on prilosec   GLA deficiency (HCC)    Glaucoma of both eyes    Hypertension    Joint pain    Osteoarthritis    Pancreatitis    Shortness of breath dyspnea    Stroke [facility])    TIA 2022   Vitamin D  deficiency    Past Surgical History:  Procedure Laterality Date   BICEPT TENODESIS Right 11/19/2022   Procedure: BICEPS TENODESIS;  Surgeon: Renee Cordella Hamilton, MD;  Location: [facility] OR;  Service: Orthopedics;  Laterality: Right;   BREAST EXCISIONAL BIOPSY Right 1990   COLONOSCOPY N/A 08/22/2013   Procedure: COLONOSCOPY;  Surgeon: Renee LOISE Kiang, MD;  Location: [facility] ENDOSCOPY;  Service: Endoscopy;  Laterality: N/A;   ESOPHAGOGASTRODUODENOSCOPY N/A 08/22/2013   Procedure: ESOPHAGOGASTRODUODENOSCOPY (EGD);  Surgeon: Renee LOISE Kiang, MD;  Location: [facility] ENDOSCOPY;  Service: Endoscopy;  Laterality: N/A;   ESOPHAGOGASTRODUODENOSCOPY (EGD) WITH PROPOFOL  N/A 08/19/2015   Procedure: ESOPHAGOGASTRODUODENOSCOPY (EGD) WITH PROPOFOL ;  Surgeon: Renee LOISE Kiang, MD;  Location: [facility] ENDOSCOPY;  Service: Endoscopy;  Laterality: N/A;   HYSTEROSCOPY WITH D & C  01/2005   for uterine fibroids.     INSERTION OF MESH N/A 08/24/2013   Procedure: INSERTION OF MESH;  Surgeon: Renee ONEIDA Olives, MD;  Location: [facility] OR;  Service: General;  Laterality: N/A;   LIPOMA EXCISION Left 06/21/2015   Procedure: EXCISION OF LEFT SCALP LIPOMA;  Surgeon: Renee Lunger, MD;  Location: [facility];  Service: General;  Laterality: Left;   PANNICULECTOMY N/A 08/24/2013   Procedure: PANNICULECTOMY;  Surgeon: Renee ONEIDA Olives, MD;  Location: [facility] OR;  Service: General;  Laterality: N/A;   REVERSE SHOULDER ARTHROPLASTY Right 11/19/2022   Procedure: REVERSE SHOULDER ARTHROPLASTY, LIPOMA REMOVAL;  Surgeon: Renee Cordella Hamilton, MD;  Location: [facility] OR;  Service: Orthopedics;  Laterality: Right;   TOTAL ABDOMINAL HYSTERECTOMY W/ BILATERAL SALPINGOOPHORECTOMY  05/2005   TOTAL HIP ARTHROPLASTY Right 12/12/2013   Procedure: RIGHT TOTAL HIP ARTHROPLASTY ANTERIOR APPROACH;  Surgeon: Renee CINDERELLA Poli, MD;  Location: [facility] OR;  Service: Orthopedics;  Laterality: Right;   VENTRAL HERNIA REPAIR N/A 08/24/2013   Procedure: HERNIA REPAIR VENTRAL ADULT;  Surgeon: Renee ONEIDA Olives, MD;  Location: [facility] OR;  Service: General;  Laterality: N/A;   Patient Active Problem List   Diagnosis Date Noted   Arthritis of right shoulder region 11/22/2022   Biceps tendonitis on right 11/22/2022   Lipoma of right shoulder 11/22/2022   S/P reverse total  shoulder arthroplasty, right 11/19/2022   Poorly controlled persistent asthma 02/27/2022   Pain in right shoulder 02/09/2022   Asthma exacerbation 09/19/2020   Anemia 10/13/2019   Glaucoma 10/13/2019   Hypertensive disorder 10/13/2019   Uterine leiomyoma 10/13/2019   Vitamin D  insufficiency 04/26/2019   TIA (transient ischemic attack) 03/30/2019   Hyperlipidemia 03/30/2019   Thyroid  nodule 03/30/2019   Stroke-like episode s/p IV tPA 03/28/2019   GERD (gastroesophageal reflux disease) 06/19/2018   Constipation 06/19/2018   Other fatigue 08/02/2017   Shortness of breath  on exertion 08/02/2017   Mass of axilla 07/07/2017   Abdominal pain, epigastric    Abnormal CT of the abdomen    Pancreatitis, acute    Acute pancreatitis 08/13/2015   Lower urinary tract infectious disease 08/13/2015   Severe obesity (BMI >= 40) (HCC) 01/23/2015   Sinusitis, chronic 01/08/2015   Cough variant asthma 01/03/2015   Arthritis of right hip 12/12/2013   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   Benign neoplasm of colon 08/22/2013   Special screening for malignant neoplasms, colon 08/22/2013   Abdominal pain 08/19/2013   Diabetes mellitus without complication (HCC) 08/19/2013   Essential hypertension, benign 08/19/2013   Ventral hernia 08/19/2013    PCP: Renee Rav, MD  REFERRING PROVIDER: Lonni CINDERELLA Poli, MD   REFERRING DIAG: [phone] (ICD-10-CM) - Pain in right hip  THERAPY DIAG:  Muscle weakness (generalized)  Difficulty in walking, not elsewhere classified  Pain in right hip  Stiffness of right hip, not elsewhere classified  Other abnormalities of gait and mobility  Rationale for Evaluation and Treatment: Rehabilitation  ONSET DATE: 6 months ago ; off and on for about a year   SUBJECTIVE:   SUBJECTIVE STATEMENT: [name] reports not as much limping.  PERTINENT HISTORY: Patient has undergone right THA in 2015 and is now experiencing right hip pain to include trochanteric bursitis. She has received a cortisone shot for this problem in the past which helped in the short term. MD sent her to PT to see if PT can resolve/assist pain prior to doing another injection.   PAIN:  NPRS scale: 3/10 Pain location: right hip  Pain description: fatigue Aggravating factors: fatigue Relieving factors: time, massaging, voltaren (used but doesn't work)  PRECAUTIONS: None  RED FLAGS: None   WEIGHT BEARING RESTRICTIONS: No  FALLS:  Has patient fallen in last 6 months? 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  DIAGNOSTIC FINDINGS: An AP pelvis and lateral of the right hip shows a total hip arthroplasty  with no complicating features.   PATIENT SURVEYS:  PSFS: THE PATIENT SPECIFIC FUNCTIONAL SCALE  Place score of 0-10 (0 = unable to perform activity and 10 = able to perform activity at the same level as before injury or problem)  Activity Date: 02/01/2024 Date ; 03/29/24   Get on my knees   6 6   2. Walk without a limp   4 4   3.     4.  Total Score 5 5     Total Score = Sum of activity scores/number of activities  Minimally Detectable Change: 3 points (for single activity); 2 points (for average score)  COGNITION: 02/01/2024 Overall cognitive status: Within functional limits for tasks assessed     SENSATION: 02/01/2024 Light touch: WFL  EDEMA:  02/01/2024 Not tested on eval  MUSCLE LENGTH: 02/01/2024 Not tested on eval   POSTURE: 02/01/2024  rounded shoulders, forward head, and increased thoracic kyphosis  PALPATION: 02/01/2024 TTP at right greater trochanter   LOWER EXTREMITY ROM:  ROM Left/Right 04/14/2024   Hip flexion 95/90   Hip extension    Hip abduction    Hip adduction    Hip internal rotation 11/7   Hip external rotation 36/33   Knee flexion    Knee extension    Ankle dorsiflexion    Ankle plantarflexion    Ankle inversion    Ankle eversion    Hamstrings 55/55    (Blank rows = not tested)  LOWER EXTREMITY MMT:  MMT  Right Eval 02/01/2024 Left Eval 02/01/2024 Right 02/15/2024 Right  03/29/24 Left/Right 04/14/2024  Hip flexion 3+/5 3+/5  4-   Hip extension 3/5 3/5  3+   Hip abduction 3+/5 3+/5 3+/5 3+ 4-/3+  Hip adduction       Hip internal rotation       Hip external rotation       Knee flexion 4+/5 5/5     Knee extension 5/5 5/5     Ankle dorsiflexion       Ankle plantarflexion       Ankle inversion       Ankle eversion        (Blank rows = not tested)  LOWER EXTREMITY SPECIAL TESTS:  02/01/2024 Hip special tests: [facility]) test: negative, Ober's test: negative, and FADIR: negative   FUNCTIONAL TESTS:  02/01/2024 Did not assess on eval   GAIT: 02/01/2024 Distance walked: not formally assessed  Assistive device utilized: None Level of assistance: supervision Comments: antalgic gait pattern, Rt lateral lean with right stance, decreased stance time on Rt LE                  TREATMENT         04/20/24 R hip Nustep  level 5 then level 3 8 Min Side-lie clams with Green Thera-Band 3 sets of 10 with slow eccentrics Supine clams with Green Thera-Band 2x R only with slow eccentrics Single knee to chest with other leg straight 4 x 15 seconds Hip hike in door frame 2 sets of 5 bilateral Hip hike at countertop 5 x each side   Date-  04/14/24 Side-lie clams with Green Thera-Band 3 sets of 10 with slow eccentrics Supine clams with Green Thera-Band 10 with slow eccentrics Single knee to chest with other leg straight 4 x 15 seconds Hip hike in door frame 2 sets of 5 bilateral Hip hike at countertop 5 x each side  02464: Discussed anatomy and importance of hip abductors focus with HEP, updated HEP with focus on side-lie clams, hip hike and single knee to chest  Neuromuscular re-education: Tandem balance 4x 20 seconds   Date-  03/29/24 Neuro Re Ed for balance and posture Supine bridge 2-3 sec hold 3 x 10  Heel to Toe balance EO 30x2 each side EC 2x each side  TherEx  Rec bike  lev 2 8 min LAQ 3x10 bil  3 # Sidelying clam shell Rt leg 2 x 10  TherAct for functional activities - bending, standing, squatting Mini squats 2x10 Sit to stand 2x10 with 5#KB ball    Manual Percussive device to Rt glute med/min/max   DATE: 03/08/24   [id number]  Neuro Re Ed for balance and posture Supine bridge 2-3 sec hold 2 x 10  Heel to Toe balance EO 30x2 each side EC 2x each side  TherEx  Rec bike lev 2 8 min SAQ 2x10 bil  2.5 # Sidelying clam shell Rt leg 2 x 10  TherAct for functional activities - bending, standing, squatting Mini squats 2x10 Sit to stand 2x10 with 2# ball at 18     PATIENT EDUCATION:  02/15/2024 Education details: HEP update Person educated: Patient Education method: Explanation, Demonstration, Tactile cues, Verbal cues, and Handouts Education comprehension: verbalized understanding, returned demonstration, verbal cues required, and tactile cues required  HOME EXERCISE PROGRAM: Access Code: [email or web address] URL: [email or web address] Date: 04/14/2024 Prepared by: Lamar Ivory  Exercises - Supine Piriformis Stretch with Foot on Ground  - 1-2 x daily - 1 x weekly - 1 sets - 5 reps - 30 hold - Clamshell (Mirrored)  - 1-2 x daily - 7 x weekly - 3 sets - 10 reps - Supine Bridge  - 1 x daily - 1 x weekly - 2 sets - 10 reps - 2-3s hold - Standing Hip Abduction with Counter Support  - 1 x daily - 1 x weekly - 2 sets - 10 reps - Standing Hip Extension with Counter Support  - 1 x daily - 1 x weekly - 2 sets - 10 reps - Mini Squat with Counter Support  - 1 x daily - 1 x weekly - 2 sets - 10 reps - Supine Active Straight Leg Raise  - 1 x daily - 1 x weekly - 2 sets - 10 reps - 2-3s hold - Standing Hip Hiking  - 5 x daily - 7 x weekly - 1 sets - 10 reps - 3-10 seconds hold - Single Knee to Chest Stretch  - 1 x daily - 7 x weekly - 1 sets - 5 reps - 15 seconds hold  ASSESSMENT:  CLINICAL IMPRESSION: Hip abductors weakness continues.  New HEP  exercises reviewed with cuing.  OBJECTIVE IMPAIRMENTS: Abnormal gait, decreased activity tolerance, decreased balance, decreased mobility, difficulty walking, decreased ROM, decreased strength, improper body mechanics, and postural dysfunction.   ACTIVITY LIMITATIONS: bending, standing, and squatting  PARTICIPATION LIMITATIONS: community activity and occupation  PERSONAL FACTORS: 3+ comorbidities: anemia, DM2, arthritis, asthma, glaucoma, GERD, prior TIA, HTN, kidney disease are also affecting patient's functional outcome.   REHAB POTENTIAL: Good  CLINICAL DECISION MAKING: Stable/uncomplicated  EVALUATION COMPLEXITY: Low   GOALS: Goals reviewed with patient? Yes  SHORT TERM GOALS: Target date: 02/22/2024 Patient will be compliant with initial HEP.  Baseline: Goal status: MET 03/08/24  2.  Patient will report pain level no greater than 4/10 to show improved overall quality of life. Baseline:  Goal status: MET 03/08/24   LONG TERM GOALS: Target date: 05/24/2024 8 weeks   Patient will be independent with final HEP to maintain and progress upon functional gains made within PT.  Baseline:  Goal status: ONGOING 04/14/24  2.  Patient will increase PSFS to at least 7 to show a significant improvement in subjective disability rating. Baseline:  Goal status: Ongoing 03/29/24  3.  Patient will report pain levels no greater than 2/10 in order to show improved overall quality of life. Baseline:  Goal status: Ongoing 04/14/2024  4.  Patient will increase bilat hip abduction MMT to at least 4+/5 in order to show improved biomechanics with functional mobility. Baseline:  Goal status:  Ongoing 04/14/24  5.  Patient will increase bilat hip extension MMT to at least 4/5 in order to show improved biomechanics with functional mobility.  Baseline:  Goal status:  Ongoing 04/14/24   PLAN:  PT FREQUENCY: 1x/week  PT DURATION: 8 weeks  PLANNED INTERVENTIONS: [id number]- PT Re-evaluation,  97750- Physical Performance Testing, 97110-Therapeutic exercises, 97530- Therapeutic activity, [id number]- Neuromuscular re-education, 97535- Self Care, [id number]- Manual therapy, [phone]- Gait training, [phone]- Electrical stimulation (unattended), [phone]- Electrical stimulation (manual), [id number]- Vasopneumatic device, [id number]- Ultrasound, [id number]- Traction (mechanical), [id number]- Ionotophoresis 4mg /ml Dexamethasone , [id number] (1-2 muscles), [id number] (3+ muscles)- Dry Needling, Patient/Family education, Balance training, Stair training, Taping, Joint mobilization, Joint manipulation, Spinal manipulation, Spinal mobilization, Scar mobilization, Cryotherapy, and Moist heat  PLAN FOR NEXT SESSION: Emphasis on hip abduction strength to correct Trendelenburg gait.  PHYSICAL THERAPY DISCHARGE SUMMARY  Visits from Start of Care: 7  Current functional level related to goals / functional outcomes: See Above    Remaining deficits: See above   Education / Equipment: HEP   Patient agrees to discharge. Patient goals were not met. Patient is being discharged due to not returning since the last visit.   Burnard Meth, PT 04/20/24  4:41 PM

## 2024-04-20 ENCOUNTER — Ambulatory Visit

## 2024-04-20 DIAGNOSIS — M25651 Stiffness of right hip, not elsewhere classified: Secondary | ICD-10-CM

## 2024-04-20 DIAGNOSIS — M6281 Muscle weakness (generalized): Secondary | ICD-10-CM | POA: Diagnosis not present

## 2024-04-20 DIAGNOSIS — M25551 Pain in right hip: Secondary | ICD-10-CM

## 2024-04-20 DIAGNOSIS — R262 Difficulty in walking, not elsewhere classified: Secondary | ICD-10-CM

## 2024-04-20 DIAGNOSIS — R2689 Other abnormalities of gait and mobility: Secondary | ICD-10-CM

## 2024-04-24 ENCOUNTER — Encounter: Payer: Self-pay | Admitting: Radiology

## 2024-04-25 NOTE — Therapy (Incomplete)
 OUTPATIENT PHYSICAL THERAPY TREATMENT NOTE   Patient Name: [name] MRN: [id number] DOB:[birth year], 68 y.o., female Today's Date: 04/25/2024  END OF SESSION:          Past Medical History:  Diagnosis Date   Anemia 2006   required transfusion post TAH/BSO 05/2005   Arthritis    Asthma    Chronic cough    Colon polyps    hyperplastic   Diabetes mellitus without complication (HCC)    Diverticulosis    GERD (gastroesophageal reflux disease)    on prilosec   GLA deficiency (HCC)    Glaucoma of both eyes    Hypertension    Joint pain    Osteoarthritis    Pancreatitis    Shortness of breath dyspnea    Stroke [facility])    TIA 2022   Vitamin D  deficiency    Past Surgical History:  Procedure Laterality Date   BICEPT TENODESIS Right 11/19/2022   Procedure: BICEPS TENODESIS;  Surgeon: Addie Cordella Hamilton, MD;  Location: [facility] OR;  Service: Orthopedics;  Laterality: Right;   BREAST EXCISIONAL BIOPSY Right 1990   COLONOSCOPY N/A 08/22/2013   Procedure: COLONOSCOPY;  Surgeon: Norleen LOISE Kiang, MD;  Location: [facility] ENDOSCOPY;  Service: Endoscopy;  Laterality: N/A;   ESOPHAGOGASTRODUODENOSCOPY N/A 08/22/2013   Procedure: ESOPHAGOGASTRODUODENOSCOPY (EGD);  Surgeon: Norleen LOISE Kiang, MD;  Location: [facility] ENDOSCOPY;  Service: Endoscopy;  Laterality: N/A;   ESOPHAGOGASTRODUODENOSCOPY (EGD) WITH PROPOFOL  N/A 08/19/2015   Procedure: ESOPHAGOGASTRODUODENOSCOPY (EGD) WITH PROPOFOL ;  Surgeon: Norleen LOISE Kiang, MD;  Location: [facility] ENDOSCOPY;  Service: Endoscopy;  Laterality: N/A;   HYSTEROSCOPY WITH D & C  01/2005   for uterine fibroids.    INSERTION OF MESH N/A 08/24/2013   Procedure: INSERTION OF MESH;  Surgeon: Morene ONEIDA Olives, MD;  Location: [facility] OR;  Service: General;  Laterality: N/A;   LIPOMA EXCISION Left 06/21/2015   Procedure: EXCISION OF LEFT SCALP LIPOMA;  Surgeon: Donnice Lunger, MD;  Location: [facility];  Service: General;  Laterality: Left;   PANNICULECTOMY N/A 08/24/2013    Procedure: PANNICULECTOMY;  Surgeon: Morene ONEIDA Olives, MD;  Location: [facility] OR;  Service: General;  Laterality: N/A;   REVERSE SHOULDER ARTHROPLASTY Right 11/19/2022   Procedure: REVERSE SHOULDER ARTHROPLASTY, LIPOMA REMOVAL;  Surgeon: Addie Cordella Hamilton, MD;  Location: [facility] OR;  Service: Orthopedics;  Laterality: Right;   TOTAL ABDOMINAL HYSTERECTOMY W/ BILATERAL SALPINGOOPHORECTOMY  05/2005   TOTAL HIP ARTHROPLASTY Right 12/12/2013   Procedure: RIGHT TOTAL HIP ARTHROPLASTY ANTERIOR APPROACH;  Surgeon: Lonni CINDERELLA Poli, MD;  Location: [facility] OR;  Service: Orthopedics;  Laterality: Right;   VENTRAL HERNIA REPAIR N/A 08/24/2013   Procedure: HERNIA REPAIR VENTRAL ADULT;  Surgeon: Morene ONEIDA Olives, MD;  Location: [facility] OR;  Service: General;  Laterality: N/A;   Patient Active Problem List   Diagnosis Date Noted   Arthritis of right shoulder region 11/22/2022   Biceps tendonitis on right 11/22/2022   Lipoma of right shoulder 11/22/2022   S/P reverse total shoulder arthroplasty, right 11/19/2022   Poorly controlled persistent asthma 02/27/2022   Pain in right shoulder 02/09/2022   Asthma exacerbation 09/19/2020   Anemia 10/13/2019   Glaucoma 10/13/2019   Hypertensive disorder 10/13/2019   Uterine leiomyoma 10/13/2019   Vitamin D  insufficiency 04/26/2019   TIA (transient ischemic attack) 03/30/2019   Hyperlipidemia 03/30/2019   Thyroid  nodule 03/30/2019   Stroke-like episode s/p IV tPA 03/28/2019   GERD (gastroesophageal reflux disease) 06/19/2018   Constipation 06/19/2018   Other fatigue  08/02/2017   Shortness of breath on exertion 08/02/2017   Mass of axilla 07/07/2017   Abdominal pain, epigastric    Abnormal CT of the abdomen    Pancreatitis, acute    Acute pancreatitis 08/13/2015   Lower urinary tract infectious disease 08/13/2015   Severe obesity (BMI >= 40) (HCC) 01/23/2015   Sinusitis, chronic 01/08/2015   Cough variant asthma 01/03/2015   Arthritis of right hip 12/12/2013    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   Benign neoplasm of colon 08/22/2013   Special screening for malignant neoplasms, colon 08/22/2013   Abdominal pain 08/19/2013   Diabetes mellitus without complication (HCC) 08/19/2013   Essential hypertension, benign 08/19/2013   Ventral hernia 08/19/2013    PCP: Alm Rav, MD  REFERRING PROVIDER: Lonni CINDERELLA Poli, MD   REFERRING DIAG: [phone] (ICD-10-CM) - Pain in right hip  THERAPY DIAG:  No diagnosis found.  Rationale for Evaluation and Treatment: Rehabilitation  ONSET DATE: 6 months ago ; off and on for about a year   SUBJECTIVE:   SUBJECTIVE STATEMENT: ***Angi reports not as much limping.  PERTINENT HISTORY: Patient has undergone right THA in 2015 and is now experiencing right hip pain to include trochanteric bursitis. She has received a cortisone shot for this problem in the past which helped in the short term. MD sent her to PT to see if PT can resolve/assist pain prior to doing another injection.   PAIN:  ***NPRS scale: 3/10 Pain location: right hip  Pain description: fatigue Aggravating factors: fatigue Relieving factors: time, massaging, voltaren (used but doesn't work)  PRECAUTIONS: None  RED FLAGS: None   WEIGHT BEARING RESTRICTIONS: No  FALLS:  Has patient fallen in last 6 months? 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  DIAGNOSTIC FINDINGS: An AP  pelvis and lateral of the right hip shows a total hip arthroplasty  with no complicating features.   PATIENT SURVEYS:  PSFS: THE PATIENT SPECIFIC FUNCTIONAL SCALE  Place score of 0-10 (0 = unable to perform activity and 10 = able to perform activity at the same level as before injury or problem)  Activity Date: 02/01/2024 Date ; 03/29/24   Get on my knees   6 6   2. Walk without a limp   4 4   3.     4.      Total Score 5 5     Total Score = Sum of activity scores/number of activities  Minimally Detectable Change: 3 points (for single activity); 2 points (for average score)  COGNITION: 02/01/2024 Overall cognitive status: Within functional limits for tasks assessed     SENSATION: 02/01/2024 Light touch: WFL  EDEMA:  02/01/2024 Not tested on eval  MUSCLE LENGTH: 02/01/2024 Not tested on eval   POSTURE: 02/01/2024  rounded shoulders, forward head, and increased thoracic kyphosis  PALPATION: 02/01/2024 TTP at right greater trochanter   LOWER EXTREMITY ROM:  ROM Left/Right 04/14/2024   Hip flexion 95/90   Hip extension  Hip abduction    Hip adduction    Hip internal rotation 11/7   Hip external rotation 36/33   Knee flexion    Knee extension    Ankle dorsiflexion    Ankle plantarflexion    Ankle inversion    Ankle eversion    Hamstrings 55/55    (Blank rows = not tested)  LOWER EXTREMITY MMT:  MMT Right Eval 02/01/2024 Left Eval 02/01/2024 Right 02/15/2024 Right  03/29/24 Left/Right 04/14/2024  Hip flexion 3+/5 3+/5  4-   Hip extension 3/5 3/5  3+   Hip abduction 3+/5 3+/5 3+/5 3+ 4-/3+  Hip adduction       Hip internal rotation       Hip external rotation       Knee flexion 4+/5 5/5     Knee extension 5/5 5/5     Ankle dorsiflexion       Ankle plantarflexion       Ankle inversion       Ankle eversion        (Blank rows = not tested)  LOWER EXTREMITY SPECIAL TESTS:  02/01/2024 Hip special tests: [facility]) test: negative, Ober's  test: negative, and FADIR: negative   FUNCTIONAL TESTS:  02/01/2024 Did not assess on eval   GAIT: 02/01/2024 Distance walked: not formally assessed  Assistive device utilized: None Level of assistance: supervision Comments: antalgic gait pattern, Rt lateral lean with right stance, decreased stance time on Rt LE                  TREATMENT         04/26/24***     04/20/24 R hip Nustep  level 5 then level 3 8 Min Side-lie clams with Green Thera-Band 3 sets of 10 with slow eccentrics Supine clams with Green Thera-Band 2x R only with slow eccentrics Single knee to chest with other leg straight 4 x 15 seconds Hip hike in door frame 2 sets of 5 bilateral Hip hike at countertop 5 x each side   Date-  04/14/24 Side-lie clams with Green Thera-Band 3 sets of 10 with slow eccentrics Supine clams with Green Thera-Band 10 with slow eccentrics Single knee to chest with other leg straight 4 x 15 seconds Hip hike in door frame 2 sets of 5 bilateral Hip hike at countertop 5 x each side  02464: Discussed anatomy and importance of hip abductors focus with HEP, updated HEP with focus on side-lie clams, hip hike and single knee to chest  Neuromuscular re-education: Tandem balance 4x 20 seconds   Date-  03/29/24 Neuro Re Ed for balance and posture Supine bridge 2-3 sec hold 3 x 10  Heel to Toe balance EO 30x2 each side EC 2x each side  TherEx  Rec bike lev 2 8 min LAQ 3x10 bil  3 # Sidelying clam shell Rt leg 2 x 10  TherAct for functional activities - bending, standing, squatting Mini squats 2x10 Sit to stand 2x10 with 5#KB ball    Manual Percussive device to Rt glute med/min/max   DATE: 03/08/24   [id number]  Neuro Re Ed for balance and posture Supine bridge 2-3 sec hold 2 x 10  Heel to Toe balance EO 30x2 each side EC 2x each side  TherEx  Rec bike lev 2 8 min SAQ 2x10 bil  2.5 # Sidelying clam shell Rt leg 2 x 10  TherAct for functional activities - bending,  standing, squatting Mini squats 2x10 Sit to stand 2x10 with  2# ball at 18     PATIENT EDUCATION:  02/15/2024 Education details: HEP update Person educated: Patient Education method: Explanation, Demonstration, Tactile cues, Verbal cues, and Handouts Education comprehension: verbalized understanding, returned demonstration, verbal cues required, and tactile cues required  HOME EXERCISE PROGRAM: Access Code: [email or web address] URL: [email or web address] Date: 04/14/2024 Prepared by: Lamar Ivory  Exercises - Supine Piriformis Stretch with Foot on Ground  - 1-2 x daily - 1 x weekly - 1 sets - 5 reps - 30 hold - Clamshell (Mirrored)  - 1-2 x daily - 7 x weekly - 3 sets - 10 reps - Supine Bridge  - 1 x daily - 1 x weekly - 2 sets - 10 reps - 2-3s hold - Standing Hip Abduction with Counter Support  - 1 x daily - 1 x weekly - 2 sets - 10 reps - Standing Hip Extension with Counter Support  - 1 x daily - 1 x weekly - 2 sets - 10 reps - Mini Squat with Counter Support  - 1 x daily - 1 x weekly - 2 sets - 10 reps - Supine Active Straight Leg Raise  - 1 x daily - 1 x weekly - 2 sets - 10 reps - 2-3s hold - Standing Hip Hiking  - 5 x daily - 7 x weekly - 1 sets - 10 reps - 3-10 seconds hold - Single Knee to Chest Stretch  - 1 x daily - 7 x weekly - 1 sets - 5 reps - 15 seconds hold  ASSESSMENT:  CLINICAL IMPRESSION: ***Hip abductors weakness continues.  New HEP exercises reviewed with cuing.  OBJECTIVE IMPAIRMENTS: Abnormal gait, decreased activity tolerance, decreased balance, decreased mobility, difficulty walking, decreased ROM, decreased strength, improper body mechanics, and postural dysfunction.   ACTIVITY LIMITATIONS: bending, standing, and squatting  PARTICIPATION LIMITATIONS: community activity and occupation  PERSONAL FACTORS: 3+ comorbidities: anemia, DM2, arthritis, asthma, glaucoma, GERD, prior TIA, HTN, kidney disease are also affecting patient's functional outcome.    REHAB POTENTIAL: Good  CLINICAL DECISION MAKING: Stable/uncomplicated  EVALUATION COMPLEXITY: Low   GOALS: Goals reviewed with patient? Yes  SHORT TERM GOALS: Target date: 02/22/2024 Patient will be compliant with initial HEP.  Baseline: Goal status: MET 03/08/24  2.  Patient will report pain level no greater than 4/10 to show improved overall quality of life. Baseline:  Goal status: MET 03/08/24   LONG TERM GOALS: Target date: 05/24/2024 8 weeks   Patient will be independent with final HEP to maintain and progress upon functional gains made within PT.  Baseline:  Goal status: ONGOING 04/14/24  2.  Patient will increase PSFS to at least 7 to show a significant improvement in subjective disability rating. Baseline:  Goal status: Ongoing 03/29/24  3.  Patient will report pain levels no greater than 2/10 in order to show improved overall quality of life. Baseline:  Goal status: Ongoing 04/14/2024  4.  Patient will increase bilat hip abduction MMT to at least 4+/5 in order to show improved biomechanics with functional mobility. Baseline:  Goal status:  Ongoing 04/14/24  5.  Patient will increase bilat hip extension MMT to at least 4/5 in order to show improved biomechanics with functional mobility.  Baseline:  Goal status:  Ongoing 04/14/24   PLAN:  PT FREQUENCY: 1x/week  PT DURATION: 8 weeks  PLANNED INTERVENTIONS: [id number]- PT Re-evaluation, 97750- Physical Performance Testing, 97110-Therapeutic exercises, 97530- Therapeutic activity, [id number]- Neuromuscular re-education, 97535- Self Care, 02859- Manual therapy, [id number]- Gait training, [phone]- Electrical stimulation (unattended), [phone]-  Electrical stimulation (manual), 02983- Vasopneumatic device, [id number]- Ultrasound, [id number]- Traction (mechanical), 02966- Ionotophoresis 4mg /ml Dexamethasone , 79439 (1-2 muscles), 20561 (3+ muscles)- Dry Needling, Patient/Family education, Balance training, Stair training, Taping, Joint mobilization,  Joint manipulation, Spinal manipulation, Spinal mobilization, Scar mobilization, Cryotherapy, and Moist heat  PLAN FOR NEXT SESSION: ***Emphasis on hip abduction strength to correct Trendelenburg gait.    Burnard Meth, PT 04/25/24  3:52 PM

## 2024-04-26 ENCOUNTER — Encounter

## 2024-04-30 NOTE — Therapy (Incomplete)
 OUTPATIENT PHYSICAL THERAPY TREATMENT NOTE   Patient Name: [name] MRN: [id number] DOB:1956-05-26, 68 y.o., female Today's Date: 04/30/2024  END OF SESSION:          Past Medical History:  Diagnosis Date   Anemia 2006   required transfusion post TAH/BSO 05/2005   Arthritis    Asthma    Chronic cough    Colon polyps    hyperplastic   Diabetes mellitus without complication (HCC)    Diverticulosis    GERD (gastroesophageal reflux disease)    on prilosec   GLA deficiency (HCC)    Glaucoma of both eyes    Hypertension    Joint pain    Osteoarthritis    Pancreatitis    Shortness of breath dyspnea    Stroke [facility])    TIA 2022   Vitamin D  deficiency    Past Surgical History:  Procedure Laterality Date   BICEPT TENODESIS Right 11/19/2022   Procedure: BICEPS TENODESIS;  Surgeon: Addie Cordella Hamilton, MD;  Location: [facility] OR;  Service: Orthopedics;  Laterality: Right;   BREAST EXCISIONAL BIOPSY Right 1990   COLONOSCOPY N/A 08/22/2013   Procedure: COLONOSCOPY;  Surgeon: Norleen LOISE Kiang, MD;  Location: [facility] ENDOSCOPY;  Service: Endoscopy;  Laterality: N/A;   ESOPHAGOGASTRODUODENOSCOPY N/A 08/22/2013   Procedure: ESOPHAGOGASTRODUODENOSCOPY (EGD);  Surgeon: Norleen LOISE Kiang, MD;  Location: [facility] ENDOSCOPY;  Service: Endoscopy;  Laterality: N/A;   ESOPHAGOGASTRODUODENOSCOPY (EGD) WITH PROPOFOL  N/A 08/19/2015   Procedure: ESOPHAGOGASTRODUODENOSCOPY (EGD) WITH PROPOFOL ;  Surgeon: Norleen LOISE Kiang, MD;  Location: [facility] ENDOSCOPY;  Service: Endoscopy;  Laterality: N/A;   HYSTEROSCOPY WITH D & C  01/2005   for uterine fibroids.    INSERTION OF MESH N/A 08/24/2013   Procedure: INSERTION OF MESH;  Surgeon: Morene ONEIDA Olives, MD;  Location: [facility] OR;  Service: General;  Laterality: N/A;   LIPOMA EXCISION Left 06/21/2015   Procedure: EXCISION OF LEFT SCALP LIPOMA;  Surgeon: Donnice Lunger, MD;  Location: [facility];  Service: General;  Laterality: Left;   PANNICULECTOMY N/A 08/24/2013    Procedure: PANNICULECTOMY;  Surgeon: Morene ONEIDA Olives, MD;  Location: [facility] OR;  Service: General;  Laterality: N/A;   REVERSE SHOULDER ARTHROPLASTY Right 11/19/2022   Procedure: REVERSE SHOULDER ARTHROPLASTY, LIPOMA REMOVAL;  Surgeon: Addie Cordella Hamilton, MD;  Location: [facility] OR;  Service: Orthopedics;  Laterality: Right;   TOTAL ABDOMINAL HYSTERECTOMY W/ BILATERAL SALPINGOOPHORECTOMY  05/2005   TOTAL HIP ARTHROPLASTY Right 12/12/2013   Procedure: RIGHT TOTAL HIP ARTHROPLASTY ANTERIOR APPROACH;  Surgeon: Lonni CINDERELLA Poli, MD;  Location: [facility] OR;  Service: Orthopedics;  Laterality: Right;   VENTRAL HERNIA REPAIR N/A 08/24/2013   Procedure: HERNIA REPAIR VENTRAL ADULT;  Surgeon: Morene ONEIDA Olives, MD;  Location: [facility] OR;  Service: General;  Laterality: N/A;   Patient Active Problem List   Diagnosis Date Noted   Arthritis of right shoulder region 11/22/2022   Biceps tendonitis on right 11/22/2022   Lipoma of right shoulder 11/22/2022   S/P reverse total shoulder arthroplasty, right 11/19/2022   Poorly controlled persistent asthma 02/27/2022   Pain in right shoulder 02/09/2022   Asthma exacerbation 09/19/2020   Anemia 10/13/2019   Glaucoma 10/13/2019   Hypertensive disorder 10/13/2019   Uterine leiomyoma 10/13/2019   Vitamin D  insufficiency 04/26/2019   TIA (transient ischemic attack) 03/30/2019   Hyperlipidemia 03/30/2019   Thyroid  nodule 03/30/2019   Stroke-like episode s/p IV tPA 03/28/2019   GERD (gastroesophageal reflux disease) 06/19/2018   Constipation 06/19/2018   Other fatigue  08/02/2017   Shortness of breath on exertion 08/02/2017   Mass of axilla 07/07/2017   Abdominal pain, epigastric    Abnormal CT of the abdomen    Pancreatitis, acute    Acute pancreatitis 08/13/2015   Lower urinary tract infectious disease 08/13/2015   Severe obesity (BMI >= 40) (HCC) 01/23/2015   Sinusitis, chronic 01/08/2015   Cough variant asthma 01/03/2015   Arthritis of right hip 12/12/2013    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   Benign neoplasm of colon 08/22/2013   Special screening for malignant neoplasms, colon 08/22/2013   Abdominal pain 08/19/2013   Diabetes mellitus without complication (HCC) 08/19/2013   Essential hypertension, benign 08/19/2013   Ventral hernia 08/19/2013    PCP: Alm Rav, MD  REFERRING PROVIDER: Lonni CINDERELLA Poli, MD   REFERRING DIAG: [phone] (ICD-10-CM) - Pain in right hip  THERAPY DIAG:  No diagnosis found.  Rationale for Evaluation and Treatment: Rehabilitation  ONSET DATE: 6 months ago ; off and on for about a year   SUBJECTIVE:   SUBJECTIVE STATEMENT: ***Carmelite reports not as much limping.  PERTINENT HISTORY: Patient has undergone right THA in 2015 and is now experiencing right hip pain to include trochanteric bursitis. She has received a cortisone shot for this problem in the past which helped in the short term. MD sent her to PT to see if PT can resolve/assist pain prior to doing another injection.   PAIN:  ***NPRS scale: 3/10 Pain location: right hip  Pain description: fatigue Aggravating factors: fatigue Relieving factors: time, massaging, voltaren (used but doesn't work)  PRECAUTIONS: None  RED FLAGS: None   WEIGHT BEARING RESTRICTIONS: No  FALLS:  Has patient fallen in last 6 months? 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  DIAGNOSTIC FINDINGS: An AP  pelvis and lateral of the right hip shows a total hip arthroplasty  with no complicating features.   PATIENT SURVEYS:  PSFS: THE PATIENT SPECIFIC FUNCTIONAL SCALE  Place score of 0-10 (0 = unable to perform activity and 10 = able to perform activity at the same level as before injury or problem)  Activity Date: 02/01/2024 Date ; 03/29/24   Get on my knees   6 6   2. Walk without a limp   4 4   3.     4.      Total Score 5 5     Total Score = Sum of activity scores/number of activities  Minimally Detectable Change: 3 points (for single activity); 2 points (for average score)  COGNITION: 02/01/2024 Overall cognitive status: Within functional limits for tasks assessed     SENSATION: 02/01/2024 Light touch: WFL  EDEMA:  02/01/2024 Not tested on eval  MUSCLE LENGTH: 02/01/2024 Not tested on eval   POSTURE: 02/01/2024  rounded shoulders, forward head, and increased thoracic kyphosis  PALPATION: 02/01/2024 TTP at right greater trochanter   LOWER EXTREMITY ROM:  ROM Left/Right 04/14/2024   Hip flexion 95/90   Hip extension  Hip abduction    Hip adduction    Hip internal rotation 11/7   Hip external rotation 36/33   Knee flexion    Knee extension    Ankle dorsiflexion    Ankle plantarflexion    Ankle inversion    Ankle eversion    Hamstrings 55/55    (Blank rows = not tested)  LOWER EXTREMITY MMT:  MMT Right Eval 02/01/2024 Left Eval 02/01/2024 Right 02/15/2024 Right  03/29/24 Left/Right 04/14/2024  Hip flexion 3+/5 3+/5  4-   Hip extension 3/5 3/5  3+   Hip abduction 3+/5 3+/5 3+/5 3+ 4-/3+  Hip adduction       Hip internal rotation       Hip external rotation       Knee flexion 4+/5 5/5     Knee extension 5/5 5/5     Ankle dorsiflexion       Ankle plantarflexion       Ankle inversion       Ankle eversion        (Blank rows = not tested)  LOWER EXTREMITY SPECIAL TESTS:  02/01/2024 Hip special tests: [facility]) test: negative, Ober's  test: negative, and FADIR: negative   FUNCTIONAL TESTS:  02/01/2024 Did not assess on eval   GAIT: 02/01/2024 Distance walked: not formally assessed  Assistive device utilized: None Level of assistance: supervision Comments: antalgic gait pattern, Rt lateral lean with right stance, decreased stance time on Rt LE                  TREATMENT         04/26/24***     04/20/24 R hip Nustep  level 5 then level 3 8 Min Side-lie clams with Green Thera-Band 3 sets of 10 with slow eccentrics Supine clams with Green Thera-Band 2x R only with slow eccentrics Single knee to chest with other leg straight 4 x 15 seconds Hip hike in door frame 2 sets of 5 bilateral Hip hike at countertop 5 x each side   Date-  04/14/24 Side-lie clams with Green Thera-Band 3 sets of 10 with slow eccentrics Supine clams with Green Thera-Band 10 with slow eccentrics Single knee to chest with other leg straight 4 x 15 seconds Hip hike in door frame 2 sets of 5 bilateral Hip hike at countertop 5 x each side  02464: Discussed anatomy and importance of hip abductors focus with HEP, updated HEP with focus on side-lie clams, hip hike and single knee to chest  Neuromuscular re-education: Tandem balance 4x 20 seconds   Date-  03/29/24 Neuro Re Ed for balance and posture Supine bridge 2-3 sec hold 3 x 10  Heel to Toe balance EO 30x2 each side EC 2x each side  TherEx  Rec bike lev 2 8 min LAQ 3x10 bil  3 # Sidelying clam shell Rt leg 2 x 10  TherAct for functional activities - bending, standing, squatting Mini squats 2x10 Sit to stand 2x10 with 5#KB ball    Manual Percussive device to Rt glute med/min/max   DATE: 03/08/24   [id number]  Neuro Re Ed for balance and posture Supine bridge 2-3 sec hold 2 x 10  Heel to Toe balance EO 30x2 each side EC 2x each side  TherEx  Rec bike lev 2 8 min SAQ 2x10 bil  2.5 # Sidelying clam shell Rt leg 2 x 10  TherAct for functional activities - bending,  standing, squatting Mini squats 2x10 Sit to stand 2x10 with  2# ball at 18     PATIENT EDUCATION:  02/15/2024 Education details: HEP update Person educated: Patient Education method: Explanation, Demonstration, Tactile cues, Verbal cues, and Handouts Education comprehension: verbalized understanding, returned demonstration, verbal cues required, and tactile cues required  HOME EXERCISE PROGRAM: Access Code: [email or web address] URL: [email or web address] Date: 04/14/2024 Prepared by: Lamar Ivory  Exercises - Supine Piriformis Stretch with Foot on Ground  - 1-2 x daily - 1 x weekly - 1 sets - 5 reps - 30 hold - Clamshell (Mirrored)  - 1-2 x daily - 7 x weekly - 3 sets - 10 reps - Supine Bridge  - 1 x daily - 1 x weekly - 2 sets - 10 reps - 2-3s hold - Standing Hip Abduction with Counter Support  - 1 x daily - 1 x weekly - 2 sets - 10 reps - Standing Hip Extension with Counter Support  - 1 x daily - 1 x weekly - 2 sets - 10 reps - Mini Squat with Counter Support  - 1 x daily - 1 x weekly - 2 sets - 10 reps - Supine Active Straight Leg Raise  - 1 x daily - 1 x weekly - 2 sets - 10 reps - 2-3s hold - Standing Hip Hiking  - 5 x daily - 7 x weekly - 1 sets - 10 reps - 3-10 seconds hold - Single Knee to Chest Stretch  - 1 x daily - 7 x weekly - 1 sets - 5 reps - 15 seconds hold  ASSESSMENT:  CLINICAL IMPRESSION: ***Hip abductors weakness continues.  New HEP exercises reviewed with cuing.  OBJECTIVE IMPAIRMENTS: Abnormal gait, decreased activity tolerance, decreased balance, decreased mobility, difficulty walking, decreased ROM, decreased strength, improper body mechanics, and postural dysfunction.   ACTIVITY LIMITATIONS: bending, standing, and squatting  PARTICIPATION LIMITATIONS: community activity and occupation  PERSONAL FACTORS: 3+ comorbidities: anemia, DM2, arthritis, asthma, glaucoma, GERD, prior TIA, HTN, kidney disease are also affecting patient's functional outcome.    REHAB POTENTIAL: Good  CLINICAL DECISION MAKING: Stable/uncomplicated  EVALUATION COMPLEXITY: Low   GOALS: Goals reviewed with patient? Yes  SHORT TERM GOALS: Target date: 02/22/2024 Patient will be compliant with initial HEP.  Baseline: Goal status: MET 03/08/24  2.  Patient will report pain level no greater than 4/10 to show improved overall quality of life. Baseline:  Goal status: MET 03/08/24   LONG TERM GOALS: Target date: 05/24/2024 8 weeks   Patient will be independent with final HEP to maintain and progress upon functional gains made within PT.  Baseline:  Goal status: ONGOING 04/14/24  2.  Patient will increase PSFS to at least 7 to show a significant improvement in subjective disability rating. Baseline:  Goal status: Ongoing 03/29/24  3.  Patient will report pain levels no greater than 2/10 in order to show improved overall quality of life. Baseline:  Goal status: Ongoing 04/14/2024  4.  Patient will increase bilat hip abduction MMT to at least 4+/5 in order to show improved biomechanics with functional mobility. Baseline:  Goal status:  Ongoing 04/14/24  5.  Patient will increase bilat hip extension MMT to at least 4/5 in order to show improved biomechanics with functional mobility.  Baseline:  Goal status:  Ongoing 04/14/24   PLAN:  PT FREQUENCY: 1x/week  PT DURATION: 8 weeks  PLANNED INTERVENTIONS: 97164- PT Re-evaluation, 97750- Physical Performance Testing, 97110-Therapeutic exercises, 97530- Therapeutic activity, [id number]- Neuromuscular re-education, 97535- Self Care, 02859- Manual therapy, [id number]- Gait training, [phone]- Electrical stimulation (unattended), [phone]-  Electrical stimulation (manual), 02983- Vasopneumatic device, [id number]- Ultrasound, [id number]- Traction (mechanical), 02966- Ionotophoresis 4mg /ml Dexamethasone , 79439 (1-2 muscles), 20561 (3+ muscles)- Dry Needling, Patient/Family education, Balance training, Stair training, Taping, Joint mobilization,  Joint manipulation, Spinal manipulation, Spinal mobilization, Scar mobilization, Cryotherapy, and Moist heat  PLAN FOR NEXT SESSION: ***Emphasis on hip abduction strength to correct Trendelenburg gait.    Burnard Meth, PT 04/30/24  11:01 AM

## 2024-05-01 ENCOUNTER — Telehealth: Payer: Self-pay

## 2024-05-01 ENCOUNTER — Encounter

## 2024-05-01 NOTE — Telephone Encounter (Signed)
 Pt missed appt on 05/01/24.  Called and spoke with patient .  She said she forgot and wants to stop and just keep up with the HEP for now.  Pt told to call or eamil with any questions.

## 2024-07-01 ENCOUNTER — Encounter (HOSPITAL_COMMUNITY): Payer: Self-pay

## 2024-07-01 ENCOUNTER — Emergency Department (HOSPITAL_COMMUNITY)

## 2024-07-01 ENCOUNTER — Emergency Department (HOSPITAL_COMMUNITY)
Admission: EM | Admit: 2024-07-01 | Discharge: 2024-07-01 | Disposition: A | Discharge status: Home / Self Care | Attending: Emergency Medicine | Admitting: Emergency Medicine

## 2024-07-01 ENCOUNTER — Other Ambulatory Visit: Payer: Self-pay

## 2024-07-01 DIAGNOSIS — I10 Essential (primary) hypertension: Secondary | ICD-10-CM | POA: Insufficient documentation

## 2024-07-01 DIAGNOSIS — R519 Headache, unspecified: Secondary | ICD-10-CM | POA: Insufficient documentation

## 2024-07-01 DIAGNOSIS — R059 Cough, unspecified: Secondary | ICD-10-CM | POA: Insufficient documentation

## 2024-07-01 DIAGNOSIS — R079 Chest pain, unspecified: Secondary | ICD-10-CM | POA: Diagnosis present

## 2024-07-01 DIAGNOSIS — Z7984 Long term (current) use of oral hypoglycemic drugs: Secondary | ICD-10-CM | POA: Diagnosis not present

## 2024-07-01 DIAGNOSIS — Z7982 Long term (current) use of aspirin: Secondary | ICD-10-CM | POA: Insufficient documentation

## 2024-07-01 DIAGNOSIS — R0789 Other chest pain: Secondary | ICD-10-CM | POA: Diagnosis not present

## 2024-07-01 DIAGNOSIS — J45909 Unspecified asthma, uncomplicated: Secondary | ICD-10-CM | POA: Insufficient documentation

## 2024-07-01 DIAGNOSIS — E119 Type 2 diabetes mellitus without complications: Secondary | ICD-10-CM | POA: Diagnosis not present

## 2024-07-01 LAB — CBC
HCT: 39.3 % (ref 36.0–46.0)
Hemoglobin: 12.8 g/dL (ref 12.0–15.0)
MCH: 29.6 pg (ref 26.0–34.0)
MCHC: 32.6 g/dL (ref 30.0–36.0)
MCV: 91 fL (ref 80.0–100.0)
Platelets: 232 K/uL (ref 150–400)
RBC: 4.32 MIL/uL (ref 3.87–5.11)
RDW: 13.4 % (ref 11.5–15.5)
WBC: 3.3 K/uL — ABNORMAL LOW (ref 4.0–10.5)
nRBC: 0 % (ref 0.0–0.2)

## 2024-07-01 LAB — BASIC METABOLIC PANEL WITH GFR
Anion gap: 10 (ref 5–15)
BUN: 15 mg/dL (ref 8–23)
CO2: 24 mmol/L (ref 22–32)
Calcium: 9.5 mg/dL (ref 8.9–10.3)
Chloride: 105 mmol/L (ref 98–111)
Creatinine, Ser: 0.93 mg/dL (ref 0.44–1.00)
GFR, Estimated: >60 mL/min
Glucose, Bld: 165 mg/dL — ABNORMAL HIGH (ref 70–99)
Potassium: 4.1 mmol/L (ref 3.5–5.1)
Sodium: 139 mmol/L (ref 135–145)

## 2024-07-01 LAB — TROPONIN T, HIGH SENSITIVITY: Troponin T High Sensitivity: <15 ng/L (ref 0–19)

## 2024-07-01 MED ORDER — PREDNISONE 20 MG PO TABS: 40.0000 mg | ORAL_TABLET | Freq: Every day | ORAL | 0 refills | Status: AC

## 2024-07-01 NOTE — ED Provider Notes (Signed)
 " [place] EMERGENCY DEPARTMENT AT [facility] Provider Note   CSN: [id number] Arrival date & time: 07/01/24  9285     Patient presents with: Chest Pain   [name] is a 69 y.o. female.   Patient is a 69 year old female with a history of hypertension, diabetes, anemia, stroke, asthma on long-term therapy with a medication she gives herself injections with every 5 weeks that she cannot remember the name of who is presenting today with complaints of chest pain.  She reports has been present for about a week.  The pain seems to be in the center of her chest and she does feel it in both shoulders right a little greater than left and seems to be worse with a deep breath.  She has not had significant shortness of breath.  Has had a little bit of congestion and a headache on occasion.  She has not had a fever that she is aware of.  She denies any nausea or vomiting.  Mild cough but no productive cough.  He has no vision changes or abdominal pain.  She has not noticed any swelling in her legs.  She has tried to use her Symbicort  but does not feel that it is changed her symptoms.  She did go to urgent care yesterday and they told her her lungs sound clear but recommended she should come to the emergency room.  She has no known heart disease.  She is compliant with her medications.  The history is provided by the patient.  Chest Pain      Prior to Admission medications  Medication Sig Start Date End Date Taking? Authorizing Provider  predniSONE  (DELTASONE ) 20 MG tablet Take 2 tablets (40 mg total) by mouth daily. 07/01/24  Yes Doretha Folks, MD  albuterol  (VENTOLIN  HFA) 108 (90 Base) MCG/ACT inhaler Inhale 2 puffs into the lungs every 6 (six) hours as needed. 01/11/24   Hunsucker, Donnice SAUNDERS, MD  aspirin  EC 81 MG EC tablet Take 1 tablet (81 mg total) by mouth daily. 03/31/19   Noemi Reena CROME, NP  atorvastatin  (LIPITOR) 20 MG tablet Take 1 tablet (20 mg total) by mouth daily at 6 PM.  03/30/19   Noemi Reena CROME, NP  budesonide -formoterol  (SYMBICORT ) 160-4.5 MCG/ACT inhaler Inhale 2 puffs into the lungs in the morning and at bedtime. 05/04/23   Hunsucker, Donnice SAUNDERS, MD  cetirizine  (ZYRTEC  ALLERGY ) 10 MG tablet Take 1 tablet (10 mg total) by mouth daily. 08/31/20   Graham, Laura E, PA-C  chlorpheniramine  (CHLOR-TRIMETON ) 4 MG tablet Take 1 tablet (4 mg total) by mouth every 6 (six) hours as needed for allergies. Patient not taking: Reported on 01/11/2024 09/18/20   Hope Almarie ORN, NP  FARXIGA  10 MG TABS tablet Take 10 mg by mouth daily. 09/26/20   [provider]  fluticasone  (FLONASE ) 50 MCG/ACT nasal spray Place 1 spray into both nostrils daily. 08/31/20   Graham, Laura E, PA-C  gabapentin  (NEURONTIN ) 600 MG tablet Take 0.5 tablets (300 mg total) by mouth at bedtime as needed. Patient not taking: Reported on 01/11/2024 03/24/23   Magnant, Charles L, PA-C  glucose blood test strip Use as instructed 08/25/17   Verdon Parry D, MD  JANUVIA 100 MG tablet Take 100 mg by mouth daily.    [provider]  metFORMIN  (GLUCOPHAGE ) 1000 MG tablet Take 1,000 mg by mouth 2 (two) times daily with a meal.    [provider]  montelukast  (SINGULAIR ) 10 MG tablet TAKE  1 TABLET(10 MG) BY MOUTH AT BEDTIME 05/19/21   Hunsucker, Donnice SAUNDERS, MD  omeprazole  (PRILOSEC) 20 MG capsule Take 1 capsule (20 mg total) by mouth in the morning and at bedtime. Patient taking differently: Take 20 mg by mouth daily. 01/02/22   Shelah Lamar RAMAN, MD  telmisartan (MICARDIS) 40 MG tablet Take 40 mg by mouth daily.    [provider]  Tezepelumab -ekko (TEZSPIRE ) 210 MG/1. SOAJ INJECT 1 PEN UNDER THE SKIN EVERY 28 DAYS **APPT NEEDED FOR REFILLS** 01/11/24   Hunsucker, Donnice SAUNDERS, MD  traMADol  (ULTRAM ) 50 MG tablet Take 1 tablet (50 mg total) by mouth every 12 (twelve) hours as needed. Patient not taking: Reported on 01/11/2024 07/26/23   Addie Cordella Hamilton, MD  Travoprost , BAK Free, (TRAVATAN )  0.004 % SOLN ophthalmic solution Place 1 drop into both eyes at bedtime. 08/11/19   [provider]  Vitamin D , Ergocalciferol , (DRISDOL ) 1.25 MG (50000 UNIT) CAPS capsule TAKE 1 CAPSULE BY MOUTH EVERY 7 DAYS 02/22/24   Georgina Ozell LABOR, MD    Allergies: Naproxen , Penicillins, and Diclofenac    Review of Systems  Cardiovascular:  Positive for chest pain.    Updated Vital Signs BP 116/70   Pulse (!) 57   Temp 97.9 F (36.6 C) (Oral)   Resp 16   Ht 5' 1 (1.549 m)   Wt 73.5 kg   SpO2 99%   BMI 30.61 kg/m   Physical Exam Vitals and nursing note reviewed.  Constitutional:      General: She is not in acute distress.    Appearance: She is well-developed.  HENT:     Head: Normocephalic and atraumatic.  Eyes:     Pupils: Pupils are equal, round, and reactive to light.  Cardiovascular:     Rate and Rhythm: Normal rate and regular rhythm.     Heart sounds: Normal heart sounds. No murmur heard.    No friction rub.  Pulmonary:     Effort: Pulmonary effort is normal.     Breath sounds: Normal breath sounds. No wheezing or rales.  Abdominal:     General: Bowel sounds are normal. There is no distension.     Palpations: Abdomen is soft.     Tenderness: There is no abdominal tenderness. There is no guarding or rebound.  Musculoskeletal:        General: No tenderness. Normal range of motion.     Right lower leg: No edema.     Left lower leg: No edema.     Comments: No edema  Skin:    General: Skin is warm and dry.     Findings: No rash.  Neurological:     Mental Status: She is alert and oriented to person, place, and time.     Cranial Nerves: No cranial nerve deficit.  Psychiatric:        Behavior: Behavior normal.     (all labs ordered are listed, but only abnormal results are displayed) Labs Reviewed  BASIC METABOLIC PANEL WITH GFR - Abnormal; Notable for the following components:      Result Value   Glucose, Bld 165 (*)    All other components within normal  limits  CBC - Abnormal; Notable for the following components:   WBC 3.3 (*)    All other components within normal limits  TROPONIN T, HIGH SENSITIVITY  TROPONIN T, HIGH SENSITIVITY    EKG: EKG Interpretation Date/Time:  Saturday July 01 2024 07:24:14 EST Ventricular Rate:  53 PR Interval:  118 QRS Duration:  122 QT Interval:  440 QTC Calculation: 412 R Axis:   35  Text Interpretation: Sinus bradycardia RSR' or QR pattern in V1 suggests right ventricular conduction delay Right bundle branch block No significant change since last tracing When compared with ECG of 26-Dec-2023 14:19, PREVIOUS ECG IS PRESENT Confirmed by Doretha Folks ([id number]) on 07/01/2024 8:48:53 AM  Radiology: [facility] Chest 2 View Result Date: 07/01/2024 CLINICAL DATA:  Chest pain EXAM: CHEST - 2 VIEW COMPARISON:  12/26/2023 FINDINGS: The lungs are clear without focal pneumonia, edema, pneumothorax or pleural effusion. The cardiopericardial silhouette is within normal limits for size. No acute bony abnormality. IMPRESSION: No active cardiopulmonary disease. Electronically Signed   By: Camellia Candle M.D.   On: 07/01/2024 08:41     Procedures   Medications Ordered in the ED - No data to display                                  Medical Decision Making Amount and/or Complexity of Data Reviewed External Data Reviewed: notes. Labs: ordered. Decision-making details documented in ED Course. Radiology: ordered and independent interpretation performed. Decision-making details documented in ED Course. ECG/medicine tests: ordered and independent interpretation performed. Decision-making details documented in ED Course.  Risk Prescription drug management.   Pt with multiple medical problems and comorbidities and presenting today with a complaint that caries a high risk for morbidity and mortality.  Here with the above complaint.  Concern for pleurisy, pneumonia, ACS, dysrhythmia, electrolyte abnormality, anemia.  Patient  has no exam findings suggestive of CHF.  She is low risk Wells and low suspicion at this time for a PE.  No symptoms to suggest dissection.  Patient's vital signs are normal.  I independently interpreted patient's labs and EKG.  EKG does show a right bundle branch block which is unchanged from prior but otherwise normal, CBC, BMP, troponin are all within normal limits.  The troponin was drawn after having a week of pain and low suspicion at this time for ACS.  I have independently visualized and interpreted pt's images today. Chest x-ray is within normal limits.  No evidence of lesions or pneumothorax.  Also no evidence of pneumonia.  All this was discussed with the patient.  Patient is well-appearing and sats are 99% on room air.  At this time patient will be discharged home.  Discussed that she could try Tylenol  for the pain but will also give a short prescription for prednisone  as she may have some exacerbation of her chronic lung disease.  She has follow-up with her doctor on Monday.  She is comfortable with this plan.      Final diagnoses:  Chest wall pain    ED Discharge Orders          Ordered    predniSONE  (DELTASONE ) 20 MG tablet  Daily        07/01/24 0910               Doretha Folks, MD 07/01/24 [phone]  "

## 2024-07-01 NOTE — Discharge Instructions (Addendum)
 Thankfully today your EKG, chest x-ray and blood work was reassuring.  There is no sign of heart attack at this time.  Also the x-ray did not show any sign of pneumonia.  Suspect the pain you are experiencing is most likely related to your lungs.  You were given a prescription for steroids that you can try to see if that helps with the pain or you can try an Tylenol  at first and if that does not help you can try the steroids.  If you start having severe chest pain, inability to breathe, passing out or other concerns return to the emergency room.

## 2024-07-01 NOTE — ED Triage Notes (Signed)
 Pt c.o central chest pain that radiates to her left shoulder since last Sunday. Pt denies any other associated symptoms. States the pain is sometimes worse when she takes in a deep breath. Resp e.u at this time
